# Patient Record
Sex: Female | Born: 1985 | Race: White | Hispanic: No | Marital: Single | Smoking: Never smoker
Health system: Southern US, Community
[De-identification: ages and names within clinical notes are randomized; demographics above are authoritative.]

## PROBLEM LIST (undated history)

## (undated) DIAGNOSIS — H539 Unspecified visual disturbance: Secondary | ICD-10-CM

## (undated) DIAGNOSIS — L309 Dermatitis, unspecified: Secondary | ICD-10-CM

## (undated) DIAGNOSIS — E039 Hypothyroidism, unspecified: Secondary | ICD-10-CM

## (undated) DIAGNOSIS — E119 Type 2 diabetes mellitus without complications: Secondary | ICD-10-CM

## (undated) DIAGNOSIS — K3184 Gastroparesis: Secondary | ICD-10-CM

## (undated) DIAGNOSIS — Z91018 Allergy to other foods: Secondary | ICD-10-CM

## (undated) DIAGNOSIS — E876 Hypokalemia: Secondary | ICD-10-CM

## (undated) DIAGNOSIS — E079 Disorder of thyroid, unspecified: Secondary | ICD-10-CM

## (undated) DIAGNOSIS — G629 Polyneuropathy, unspecified: Secondary | ICD-10-CM

## (undated) DIAGNOSIS — D496 Neoplasm of unspecified behavior of brain: Secondary | ICD-10-CM

## (undated) DIAGNOSIS — J45909 Unspecified asthma, uncomplicated: Secondary | ICD-10-CM

## (undated) HISTORY — DX: Unspecified asthma, uncomplicated: J45.909

## (undated) HISTORY — DX: Dermatitis, unspecified: L30.9

## (undated) HISTORY — DX: Allergy to other foods: Z91.018

## (undated) HISTORY — PX: BRAIN SURGERY: SHX531

## (undated) HISTORY — PX: WISDOM TOOTH EXTRACTION: SHX21

## (undated) HISTORY — DX: Unspecified visual disturbance: H53.9

## (undated) HISTORY — PX: OTHER SURGICAL HISTORY: SHX169

## (undated) HISTORY — DX: Gastroparesis: K31.84

---
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
## 2010-03-30 ENCOUNTER — Other Ambulatory Visit: Payer: Self-pay | Admitting: Gastroenterology

## 2010-03-30 DIAGNOSIS — R197 Diarrhea, unspecified: Secondary | ICD-10-CM

## 2010-04-09 ENCOUNTER — Ambulatory Visit
Admission: RE | Admit: 2010-04-09 | Discharge: 2010-04-09 | Disposition: A | Discharge status: Home / Self Care | Payer: BC Managed Care – PPO | Source: Ambulatory Visit | Attending: Gastroenterology | Admitting: Gastroenterology

## 2010-04-09 DIAGNOSIS — R197 Diarrhea, unspecified: Secondary | ICD-10-CM

## 2010-04-09 IMAGING — RF DG UGI W/ SMALL BOWEL
19 of 24 series · 19 of 24 positions shown · non-contrast
Comparison: None.

CLINICAL DATA: Diarrhea.

UPPER GI W/ SMALL BOWEL
TECHNIQUE: Upper GI series performed with high density barium and
effervescent agent. Thin barium also used.  Subsequently, serial
images of the small bowel were obtained including spot views of the
terminal ileum.
Fluoroscopy Time: 2.2 minutes, pediatric technique utilized.

[Series 1: run · 1 of 1 slices shown (1 of 19)]
[im 1/1]
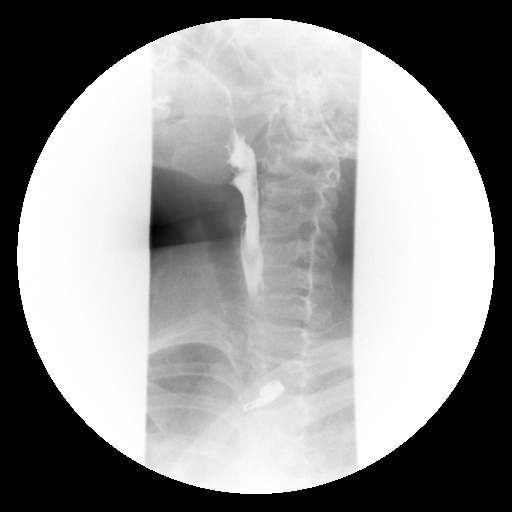

[Series 2: run · 1 of 1 slices shown (2 of 19)]
[im 1/1]
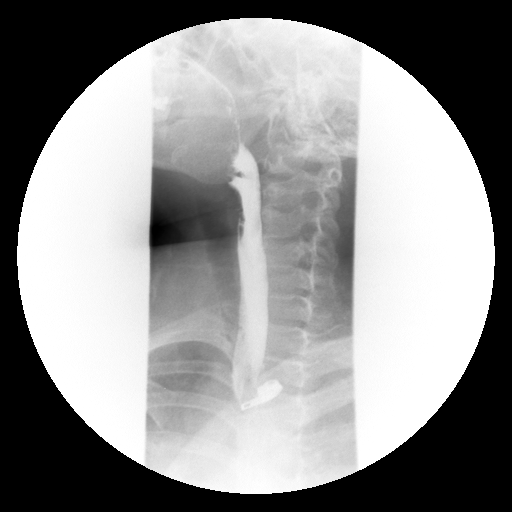

[Series 4: run · 1 of 1 slices shown (3 of 19)]
[im 1/1]
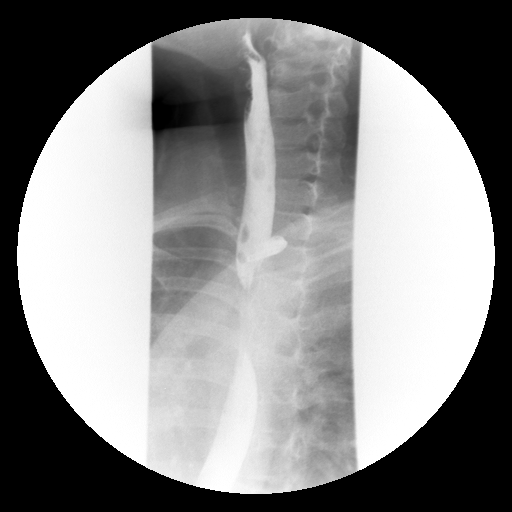

[Series 5: run · 1 of 1 slices shown (4 of 19)]
[im 1/1]
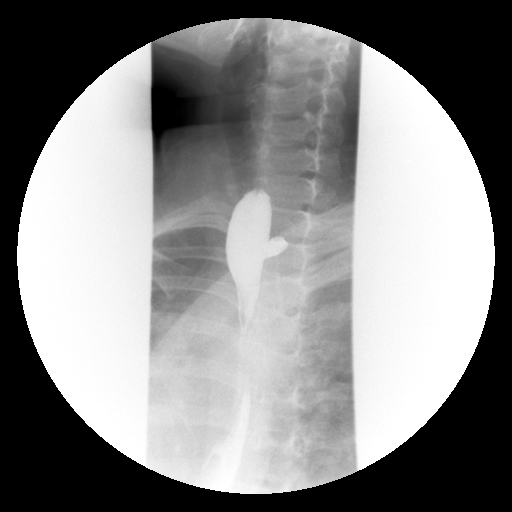

[Series 6: run · 1 of 1 slices shown (5 of 19)]
[im 1/1]
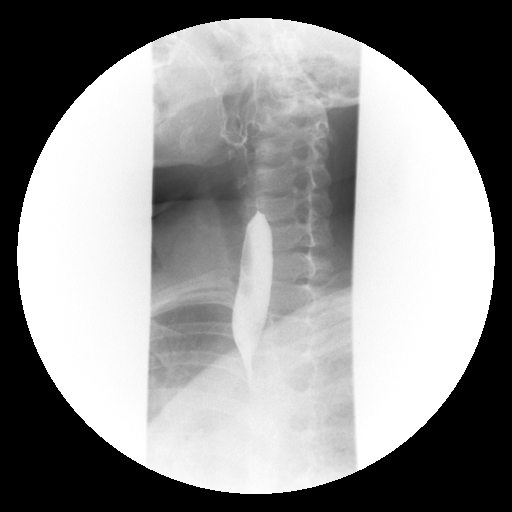

[Series 7: run · 1 of 1 slices shown (6 of 19)]
[im 1/1]
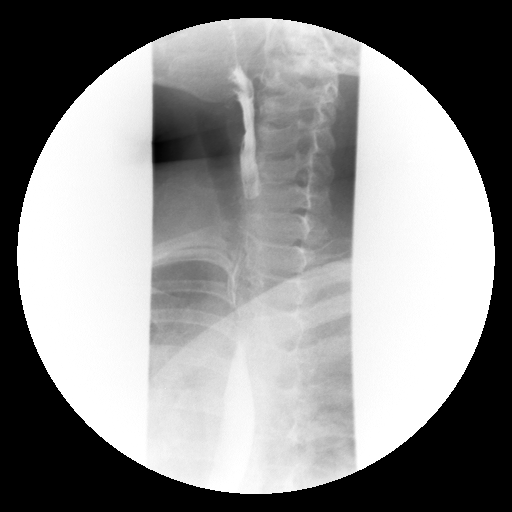

[Series 9: run · 1 of 1 slices shown (7 of 19)]
[im 1/1]
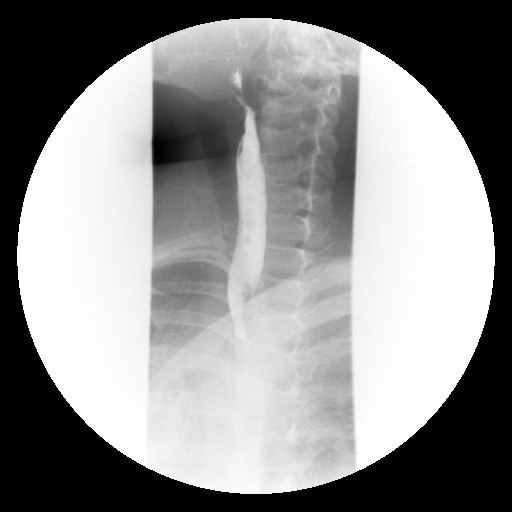

[Series 10: run · 1 of 1 slices shown (8 of 19)]
[im 1/1]
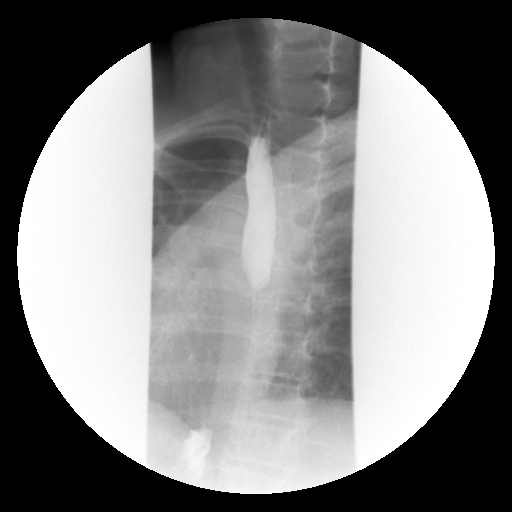

[Series 11: run · 1 of 1 slices shown (9 of 19)]
[im 1/1]
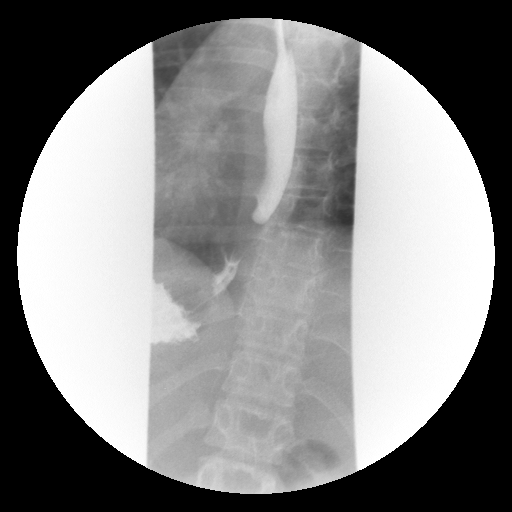

[Series 13: run · 1 of 1 slices shown (10 of 19)]
[im 1/1]
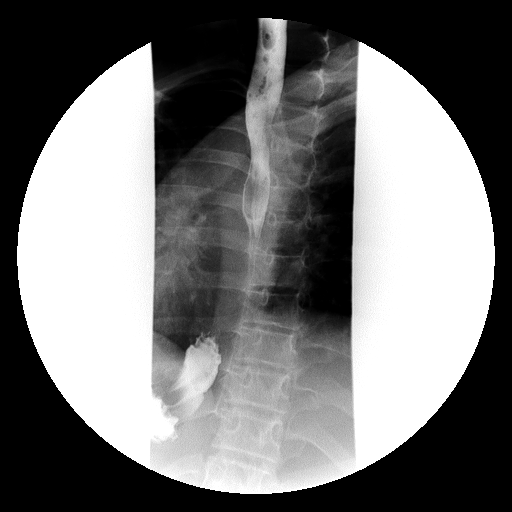

[Series 14: run · 1 of 1 slices shown (11 of 19)]
[im 1/1]
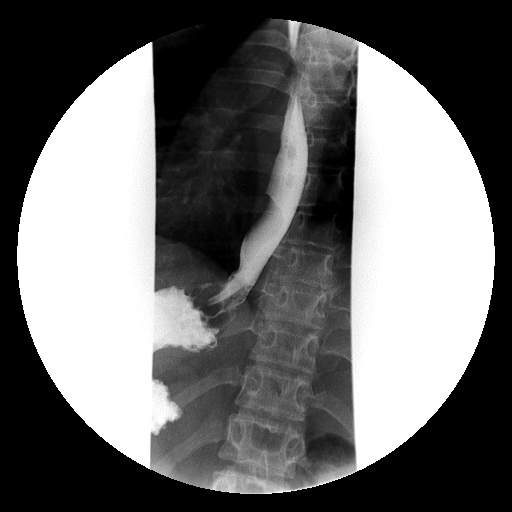

[Series 15: run · 1 of 1 slices shown (12 of 19)]
[im 1/1]
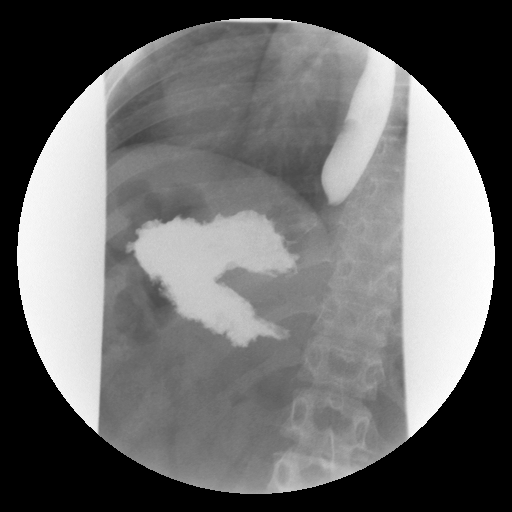

[Series 16: run · 1 of 1 slices shown (13 of 19)]
[im 1/1]
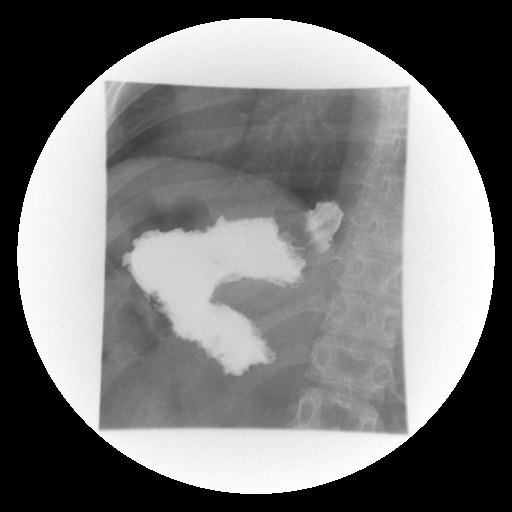

[Series 18: run · 1 of 1 slices shown (14 of 19)]
[im 1/1]
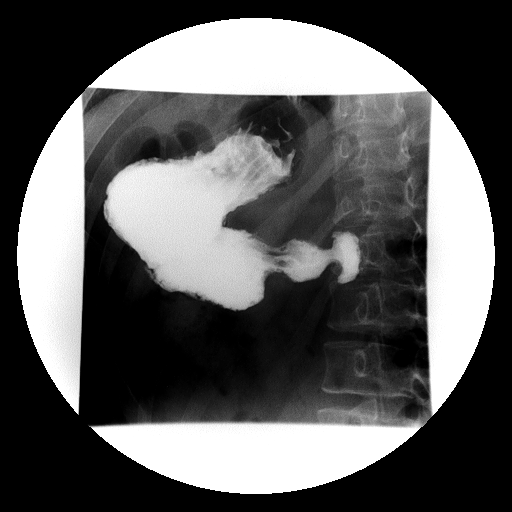

[Series 19: run · 1 of 1 slices shown (15 of 19)]
[im 1/1]
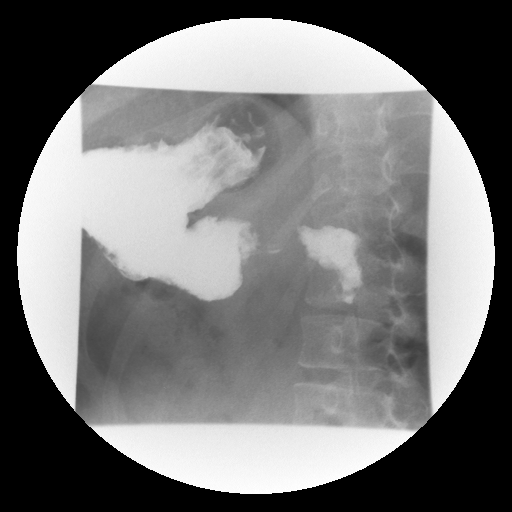

[Series 20: run · 1 of 1 slices shown (16 of 19)]
[im 1/1]
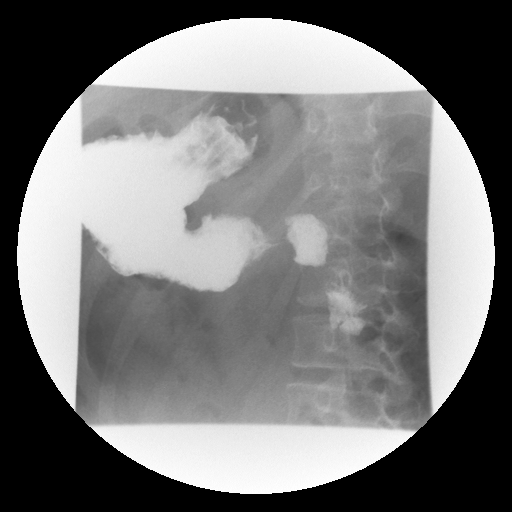

[Series 21: run · 1 of 1 slices shown (17 of 19)]
[im 1/1]
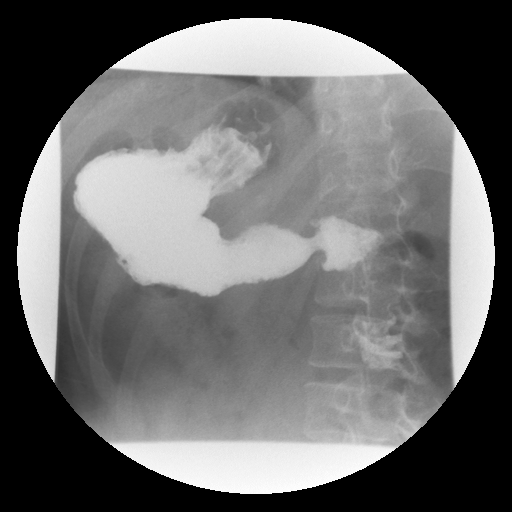

[Series 23: run · 1 of 1 slices shown (18 of 19)]
[im 1/1]
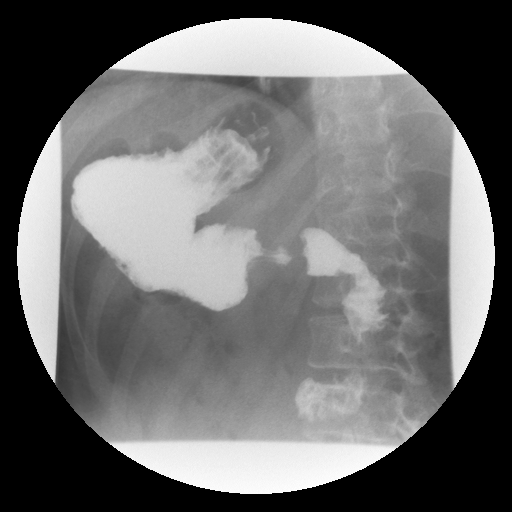

[Series 24: run · 1 of 1 slices shown (19 of 19)]
[im 1/1]
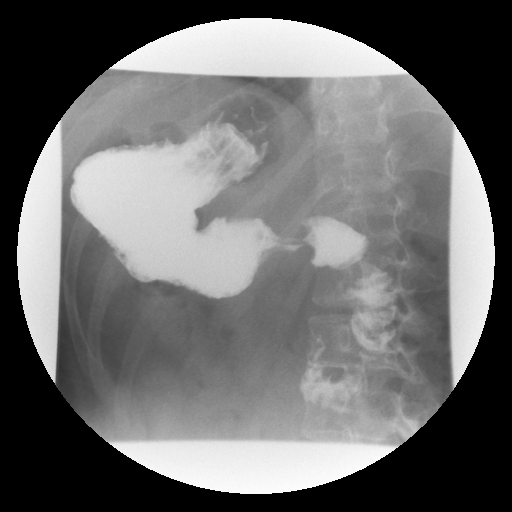

[19 of 24 positions shown; findings below may reference images not displayed]

FINDINGS: Scout view demonstrates moderate retained colonic feces
with normal bowel gas pattern.

Single contrast barium study demonstrates normal antegrade
peristalsis through cervical and thoracic esophagus with no
intrinsic or extrinsic esophageal lesion.  No spontaneous or
induced (Valsalva/water siphon) gastroesophageal reflux
demonstrated.

Stomach and duodenum appear normal with prompt egress of barium.

Small bowel loops appear normal with no focal lesions.
Specifically, spot films of terminal ileum appear normal without
inflammation.  However, barium transit time is slightly dilated 2
hours 30 minutes.
IMPRESSION: 1.  Slightly delayed small bowel barium transit time at 2 hours 30
minutes delay.
2.  Otherwise, negative. (Moderate retained colonic feces).

## 2013-03-17 ENCOUNTER — Encounter (HOSPITAL_COMMUNITY): Payer: Self-pay | Admitting: Emergency Medicine

## 2013-03-17 ENCOUNTER — Emergency Department (HOSPITAL_COMMUNITY)
Admission: EM | Admit: 2013-03-17 | Discharge: 2013-03-17 | Disposition: A | Discharge status: Home / Self Care | Payer: BC Managed Care – PPO | Attending: Emergency Medicine | Admitting: Emergency Medicine

## 2013-03-17 DIAGNOSIS — E109 Type 1 diabetes mellitus without complications: Secondary | ICD-10-CM | POA: Insufficient documentation

## 2013-03-17 DIAGNOSIS — Z794 Long term (current) use of insulin: Secondary | ICD-10-CM | POA: Insufficient documentation

## 2013-03-17 DIAGNOSIS — R739 Hyperglycemia, unspecified: Secondary | ICD-10-CM

## 2013-03-17 DIAGNOSIS — R112 Nausea with vomiting, unspecified: Secondary | ICD-10-CM | POA: Insufficient documentation

## 2013-03-17 DIAGNOSIS — R197 Diarrhea, unspecified: Secondary | ICD-10-CM

## 2013-03-17 DIAGNOSIS — R109 Unspecified abdominal pain: Secondary | ICD-10-CM | POA: Insufficient documentation

## 2013-03-17 DIAGNOSIS — E079 Disorder of thyroid, unspecified: Secondary | ICD-10-CM | POA: Insufficient documentation

## 2013-03-17 DIAGNOSIS — Z79899 Other long term (current) drug therapy: Secondary | ICD-10-CM | POA: Insufficient documentation

## 2013-03-17 DIAGNOSIS — E039 Hypothyroidism, unspecified: Secondary | ICD-10-CM | POA: Insufficient documentation

## 2013-03-17 DIAGNOSIS — E876 Hypokalemia: Secondary | ICD-10-CM | POA: Insufficient documentation

## 2013-03-17 DIAGNOSIS — G589 Mononeuropathy, unspecified: Secondary | ICD-10-CM | POA: Insufficient documentation

## 2013-03-17 HISTORY — DX: Hypomagnesemia: E83.42

## 2013-03-17 HISTORY — DX: Hypothyroidism, unspecified: E03.9

## 2013-03-17 HISTORY — DX: Hypokalemia: E87.6

## 2013-03-17 HISTORY — DX: Polyneuropathy, unspecified: G62.9

## 2013-03-17 HISTORY — DX: Type 2 diabetes mellitus without complications: E11.9

## 2013-03-17 HISTORY — DX: Neoplasm of unspecified behavior of brain: D49.6

## 2013-03-17 HISTORY — DX: Disorder of thyroid, unspecified: E07.9

## 2013-03-17 LAB — BASIC METABOLIC PANEL
BUN: 11 mg/dL (ref 6–23)
CHLORIDE: 96 meq/L (ref 96–112)
CO2: 24 meq/L (ref 19–32)
Calcium: 9.1 mg/dL (ref 8.4–10.5)
Creatinine, Ser: 0.76 mg/dL (ref 0.50–1.10)
GFR calc Af Amer: >90 mL/min (ref >90)
GFR calc non Af Amer: >90 mL/min (ref >90)
GLUCOSE: 291 mg/dL — AB (ref 70–99)
Potassium: 4.3 mEq/L (ref 3.7–5.3)
SODIUM: 136 meq/L — AB (ref 137–147)

## 2013-03-17 LAB — POC URINE PREG, ED: PREG TEST UR: NEGATIVE

## 2013-03-17 MED ORDER — ONDANSETRON HCL 4 MG/2ML IJ SOLN
4.0000 mg | Freq: Once | INTRAMUSCULAR | Status: AC
  Administered 2013-03-17: 4 mg via INTRAVENOUS
  Filled 2013-03-17: qty 2

## 2013-03-17 MED ORDER — SODIUM CHLORIDE 0.9 % IV BOLUS (SEPSIS)
1000.0000 mL | Freq: Once | INTRAVENOUS | Status: AC
  Administered 2013-03-17: 1000 mL via INTRAVENOUS

## 2013-03-17 MED ORDER — ONDANSETRON 4 MG PO TBDP: 4.0000 mg | ORAL_TABLET | ORAL | Status: DC | PRN

## 2013-03-17 NOTE — ED Notes (Signed)
Pt resting, given water for fluid challenge

## 2013-03-17 NOTE — ED Provider Notes (Signed)
CSN: [id number]     Arrival date & time 03/17/13  0808 History   First MD Initiated Contact with Patient 03/17/13 0809     No chief complaint on file.    (Consider location/radiation/quality/duration/timing/severity/associated sxs/prior Treatment) HPI Comments: 28 year old female presents with diarrhea over the last 8 hours or so. The patient and her mom does state that the diarrhea has been watery multiple times. Patient had similar episode 4 days ago which lasted all day. Both of these times occurred after having tuna fish to eat. The patient did vomit once. She's had some vague abdominal pain during this diarrhea. No blood in her stools. No fevers or recent travel or antibiotics. The patient is a type I diabetic his regular insulin. Her most recent glucose is over 200. She's had to adjust her insulin pump due to the concern about getting hypoglycemic.   No past medical history on file. No past surgical history on file. No family history on file. History  Substance Use Topics  . Smoking status: Not on file  . Smokeless tobacco: Not on file  . Alcohol Use: Not on file   OB History   No data available     Review of Systems  Constitutional: Negative for fever and chills.  Gastrointestinal: Positive for nausea, vomiting (once), abdominal pain and diarrhea. Negative for blood in stool.  Genitourinary: Negative for dysuria.  Neurological: Negative for light-headedness.  All other systems reviewed and are negative.      Allergies  Review of patient's allergies indicates not on file.  Home Medications  No current outpatient prescriptions on file. BP 91/61  Pulse 75  Temp(Src) 98 F (36.7 C) (Oral)  Resp 18  Ht 5' 2.5" (1.588 m)  SpO2 98%  LMP 03/13/2013 Physical Exam  Nursing note and vitals reviewed. Constitutional: She is oriented to person, place, and time. She appears well-developed and well-nourished. No distress.  HENT:  Head: Normocephalic and atraumatic.  Right  Ear: External ear normal.  Left Ear: External ear normal.  Nose: Nose normal.  Eyes: Right eye exhibits no discharge. Left eye exhibits no discharge.  Cardiovascular: Normal rate, regular rhythm and normal heart sounds.   Pulse right around 100  Pulmonary/Chest: Effort normal and breath sounds normal.  Abdominal: Soft. She exhibits no distension. There is no tenderness.  Neurological: She is alert and oriented to person, place, and time.  Skin: Skin is warm and dry.    ED Course  Procedures (including critical care time) Labs Review Labs Reviewed  BASIC METABOLIC PANEL - Abnormal; Notable for the following:    Sodium 136 (*)    Glucose, Bld 291 (*)    All other components within normal limits  POC URINE PREG, ED   Imaging Review No results found.   EKG Interpretation None      MDM   Final diagnoses:  Diarrhea  Hyperglycemia    Patient's HR improved with fluids. No vomiting in ED, was able to take PO. Benign abd exam. Her BP at discharge was in the 90s, but family states she's always "low" but don't know exact numbers. She is able to take po, has normal HR and feels normal. I feel she is stable for discharge, given strict return precautions.     Ephraim Hamburger, MD 03/17/13 [phone]

## 2013-03-17 NOTE — ED Notes (Signed)
Pt check own cbg and now down to 130, given sprite per family request

## 2013-03-17 NOTE — ED Notes (Signed)
Pt checked own cbg and it was 180.

## 2013-03-17 NOTE — ED Notes (Signed)
bp is 91/61, dr Regenia Skeeter is aware and family reports that pt normally has a low bp. Pt reports feeling better, is a&ox4. No distress noted.

## 2013-03-17 NOTE — ED Notes (Signed)
Pt tolerated po challenge, no complaints, no distress noted

## 2013-03-17 NOTE — ED Notes (Signed)
Pt and family member reports pt having diarrhea x 4 days. Had n/v this am.

## 2013-07-14 ENCOUNTER — Emergency Department (HOSPITAL_COMMUNITY): Payer: BC Managed Care – PPO

## 2013-07-14 ENCOUNTER — Emergency Department (HOSPITAL_COMMUNITY)
Admission: EM | Admit: 2013-07-14 | Discharge: 2013-07-14 | Disposition: A | Discharge status: Home / Self Care | Payer: BC Managed Care – PPO | Attending: Emergency Medicine | Admitting: Emergency Medicine

## 2013-07-14 ENCOUNTER — Encounter (HOSPITAL_COMMUNITY): Payer: Self-pay | Admitting: Emergency Medicine

## 2013-07-14 DIAGNOSIS — R109 Unspecified abdominal pain: Secondary | ICD-10-CM | POA: Diagnosis not present

## 2013-07-14 DIAGNOSIS — E119 Type 2 diabetes mellitus without complications: Secondary | ICD-10-CM | POA: Diagnosis not present

## 2013-07-14 DIAGNOSIS — Z3202 Encounter for pregnancy test, result negative: Secondary | ICD-10-CM | POA: Insufficient documentation

## 2013-07-14 DIAGNOSIS — R5381 Other malaise: Secondary | ICD-10-CM | POA: Diagnosis not present

## 2013-07-14 DIAGNOSIS — Z794 Long term (current) use of insulin: Secondary | ICD-10-CM | POA: Diagnosis not present

## 2013-07-14 DIAGNOSIS — E039 Hypothyroidism, unspecified: Secondary | ICD-10-CM | POA: Insufficient documentation

## 2013-07-14 DIAGNOSIS — R5383 Other fatigue: Secondary | ICD-10-CM

## 2013-07-14 DIAGNOSIS — Z86011 Personal history of benign neoplasm of the brain: Secondary | ICD-10-CM | POA: Insufficient documentation

## 2013-07-14 DIAGNOSIS — Z79899 Other long term (current) drug therapy: Secondary | ICD-10-CM | POA: Diagnosis not present

## 2013-07-14 DIAGNOSIS — R197 Diarrhea, unspecified: Secondary | ICD-10-CM | POA: Diagnosis present

## 2013-07-14 DIAGNOSIS — E876 Hypokalemia: Secondary | ICD-10-CM | POA: Insufficient documentation

## 2013-07-14 DIAGNOSIS — R110 Nausea: Secondary | ICD-10-CM | POA: Diagnosis not present

## 2013-07-14 DIAGNOSIS — R739 Hyperglycemia, unspecified: Secondary | ICD-10-CM

## 2013-07-14 LAB — URINALYSIS, ROUTINE W REFLEX MICROSCOPIC
BILIRUBIN URINE: NEGATIVE
Glucose, UA: NEGATIVE mg/dL
Ketones, ur: NEGATIVE mg/dL
Leukocytes, UA: NEGATIVE
NITRITE: NEGATIVE
PROTEIN: NEGATIVE mg/dL
Specific Gravity, Urine: 1.02 (ref 1.005–1.030)
UROBILINOGEN UA: 0.2 mg/dL (ref 0.0–1.0)
pH: 6 (ref 5.0–8.0)

## 2013-07-14 LAB — COMPREHENSIVE METABOLIC PANEL
ALT: 13 U/L (ref 0–35)
AST: 16 U/L (ref 0–37)
Albumin: 3.7 g/dL (ref 3.5–5.2)
Alkaline Phosphatase: 82 U/L (ref 39–117)
BILIRUBIN TOTAL: 0.4 mg/dL (ref 0.3–1.2)
BUN: 14 mg/dL (ref 6–23)
CHLORIDE: 97 meq/L (ref 96–112)
CO2: 23 meq/L (ref 19–32)
CREATININE: 0.74 mg/dL (ref 0.50–1.10)
Calcium: 8.7 mg/dL (ref 8.4–10.5)
GLUCOSE: 214 mg/dL — AB (ref 70–99)
Potassium: 4 mEq/L (ref 3.7–5.3)
Sodium: 136 mEq/L — ABNORMAL LOW (ref 137–147)
Total Protein: 7.6 g/dL (ref 6.0–8.3)

## 2013-07-14 LAB — CBC WITH DIFFERENTIAL/PLATELET
BASOS ABS: 0 10*3/uL (ref 0.0–0.1)
Basophils Relative: 0 % (ref 0–1)
EOS PCT: 1 % (ref 0–5)
Eosinophils Absolute: 0.1 10*3/uL (ref 0.0–0.7)
HEMATOCRIT: 36.1 % (ref 36.0–46.0)
HEMOGLOBIN: 12.2 g/dL (ref 12.0–15.0)
LYMPHS ABS: 1.5 10*3/uL (ref 0.7–4.0)
LYMPHS PCT: 13 % (ref 12–46)
MCH: 31.1 pg (ref 26.0–34.0)
MCHC: 33.8 g/dL (ref 30.0–36.0)
MCV: 92.1 fL (ref 78.0–100.0)
MONO ABS: 1.1 10*3/uL — AB (ref 0.1–1.0)
MONOS PCT: 9 % (ref 3–12)
NEUTROS ABS: 9 10*3/uL — AB (ref 1.7–7.7)
Neutrophils Relative %: 77 % (ref 43–77)
Platelets: 375 10*3/uL (ref 150–400)
RBC: 3.92 MIL/uL (ref 3.87–5.11)
RDW: 13.2 % (ref 11.5–15.5)
WBC: 11.6 10*3/uL — AB (ref 4.0–10.5)

## 2013-07-14 LAB — I-STAT VENOUS BLOOD GAS, ED
Acid-base deficit: 3 mmol/L — ABNORMAL HIGH (ref 0.0–2.0)
Bicarbonate: 22.1 mEq/L (ref 20.0–24.0)
O2 SAT: 94 %
PCO2 VEN: 39.5 mmHg — AB (ref 45.0–50.0)
PO2 VEN: 74 mmHg — AB (ref 30.0–45.0)
TCO2: 23 mmol/L (ref 0–100)
pH, Ven: 7.356 — ABNORMAL HIGH (ref 7.250–7.300)

## 2013-07-14 LAB — LIPASE, BLOOD: Lipase: 15 U/L (ref 11–59)

## 2013-07-14 LAB — URINE MICROSCOPIC-ADD ON

## 2013-07-14 LAB — KETONES, QUALITATIVE: Acetone, Bld: NEGATIVE

## 2013-07-14 LAB — CBG MONITORING, ED: Glucose-Capillary: 233 mg/dL — ABNORMAL HIGH (ref 70–99)

## 2013-07-14 LAB — PREGNANCY, URINE: PREG TEST UR: NEGATIVE

## 2013-07-14 LAB — CLOSTRIDIUM DIFFICILE BY PCR: Toxigenic C. Difficile by PCR: NEGATIVE

## 2013-07-14 IMAGING — CR DG ABDOMEN ACUTE W/ 1V CHEST
3 series · 3 of 3 positions shown · non-contrast
Comparison: [DATE]

CLINICAL DATA: DIABETES DIARRHEA ABDOMINAL PAIN

EXAM:
ACUTE ABDOMEN SERIES (ABDOMEN 2 VIEW & CHEST 1 VIEW)

[w chest pa]
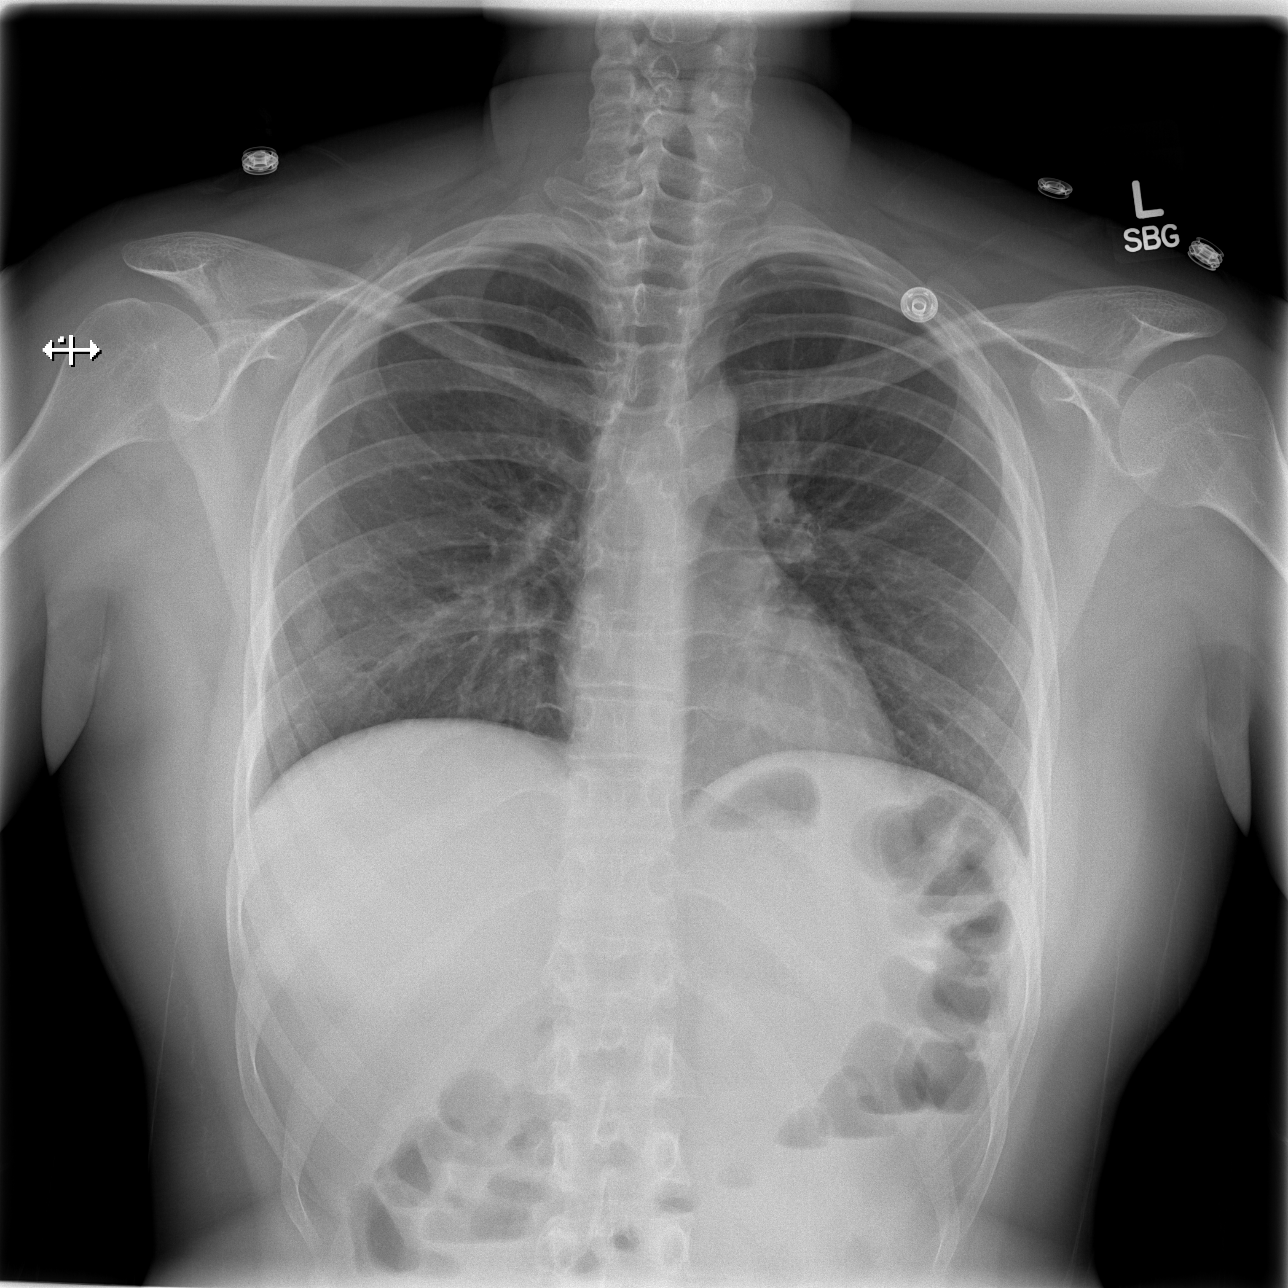

[w abdomen upright]
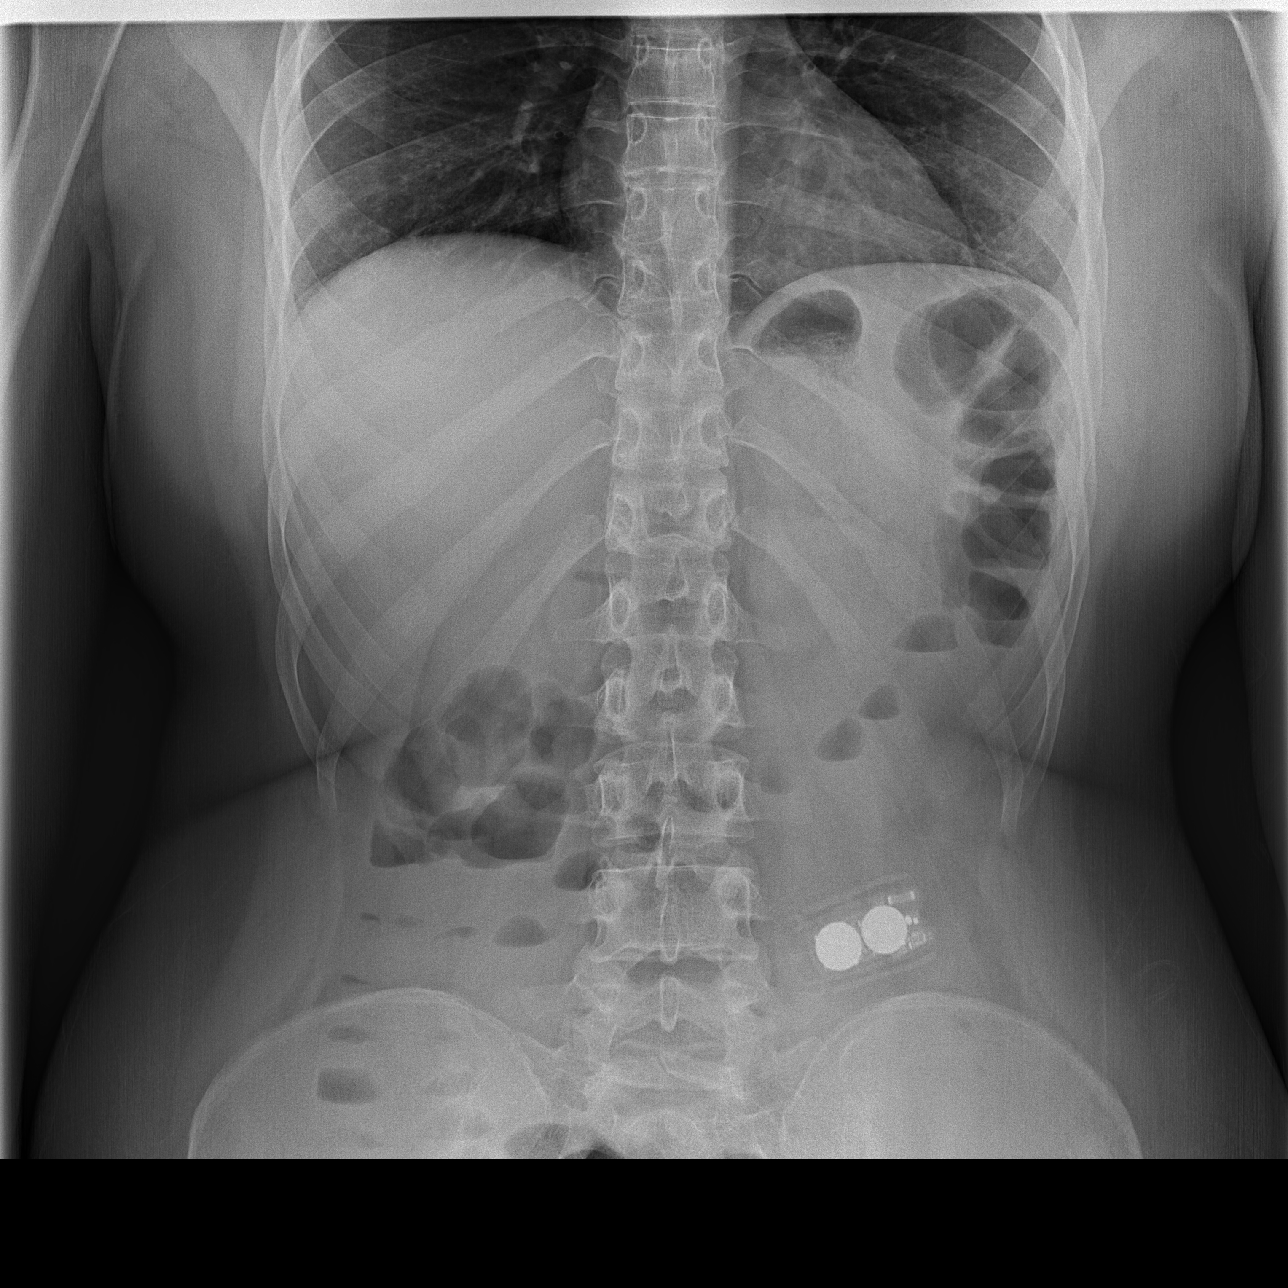

[t abdomen supine]
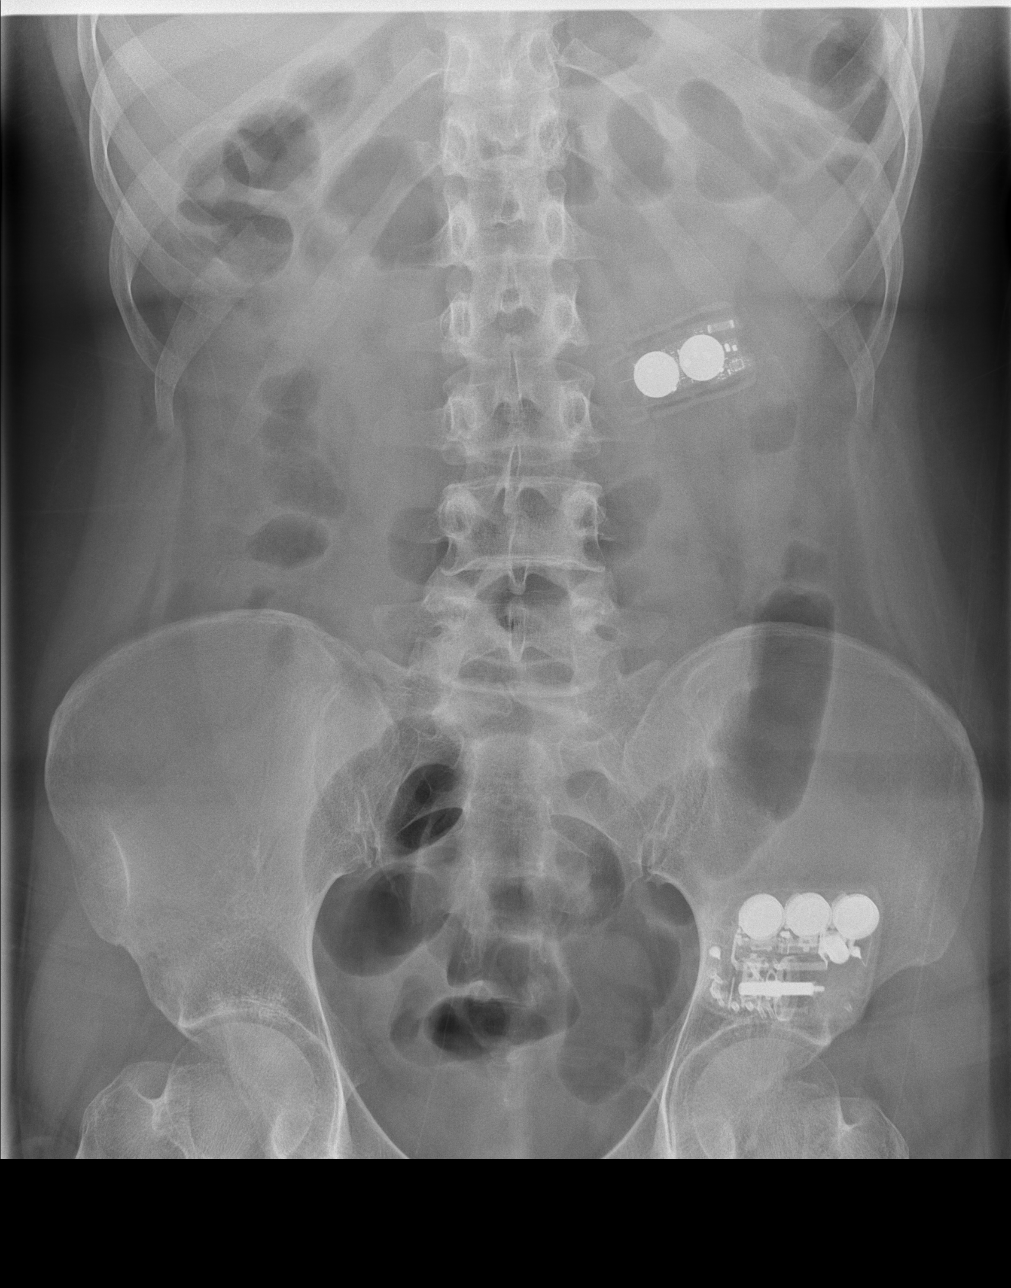

[3 of 3 positions shown; findings below may reference images not displayed]

FINDINGS: Heart size and mediastinal contours are within normal limits.

Lungs are clear. No effusion.

No free air. Small bowel and colon decompressed. A few fluid levels
in the proximal colon indicating liquid content.

There are no abnormal calcifications. Electronic devices project
over the mid left abdomen and left supra-acetabular region.

Regional bones unremarkable.
IMPRESSION: No acute cardiopulmonary disease.

Nonobstructive bowel gas pattern.

## 2013-07-14 IMAGING — CT CT ABD-PELV W/O CM
2 of 4 series · 4 of 46 positions shown, 5 images · non-contrast
Comparison: Earlier radiographs of the same day

CLINICAL DATA: DIABETES DIARRHEA ABDOMINAL PAIN

EXAM:
CT ABDOMEN AND PELVIS WITHOUT CONTRAST
TECHNIQUE: Multidetector CT imaging of the abdomen and pelvis was performed
following the standard protocol without IV contrast.

[Series 206: cor · coronal · 0.50mm/px · 3 of 79 slices shown, 4 images]
[im 18/79  soft-tissue]
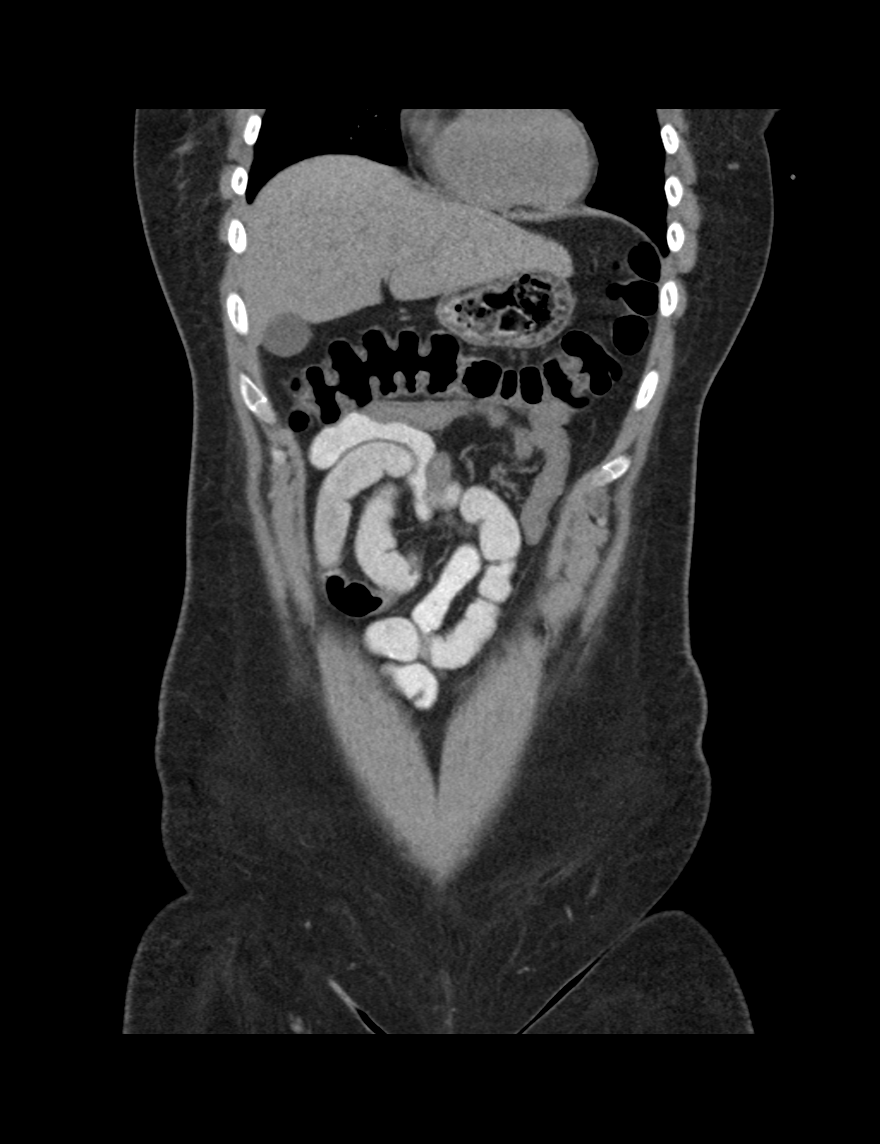
[im 18/79  bone]
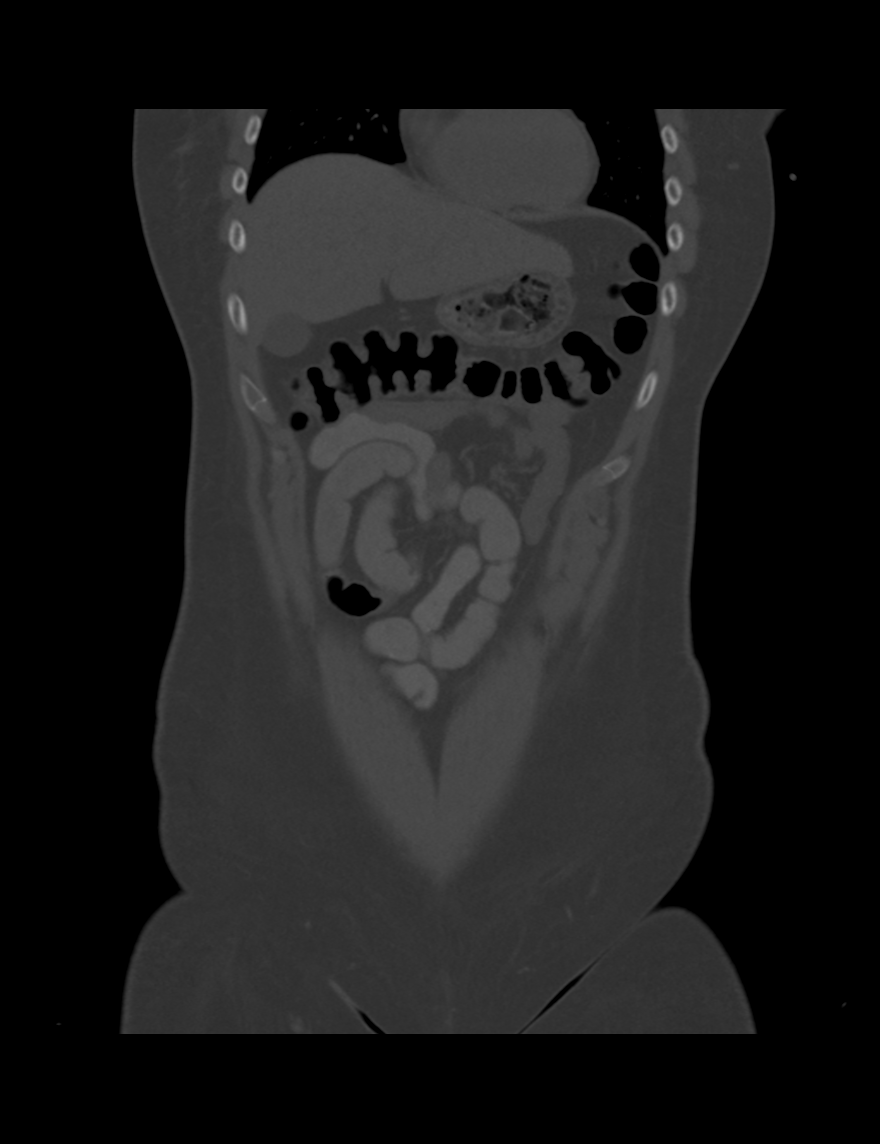
[im 44/79  soft-tissue]
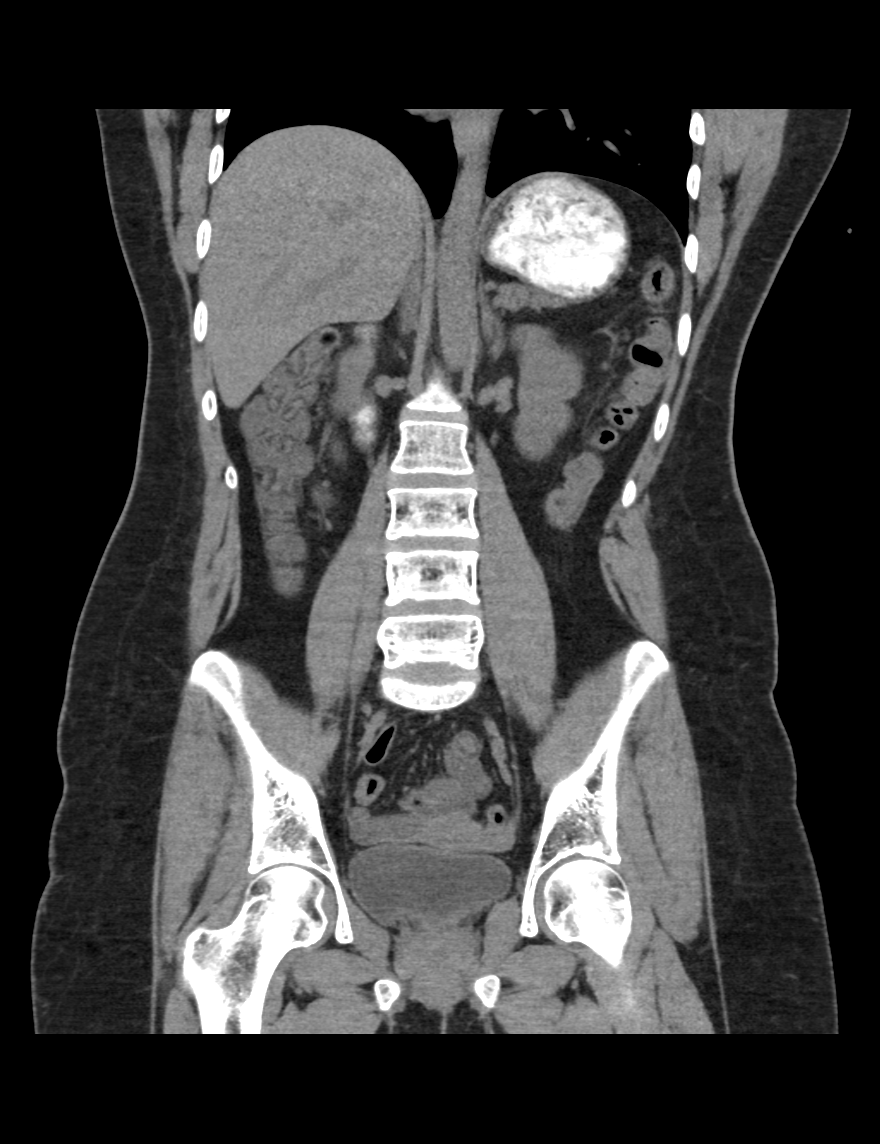
[im 61/79  soft-tissue]
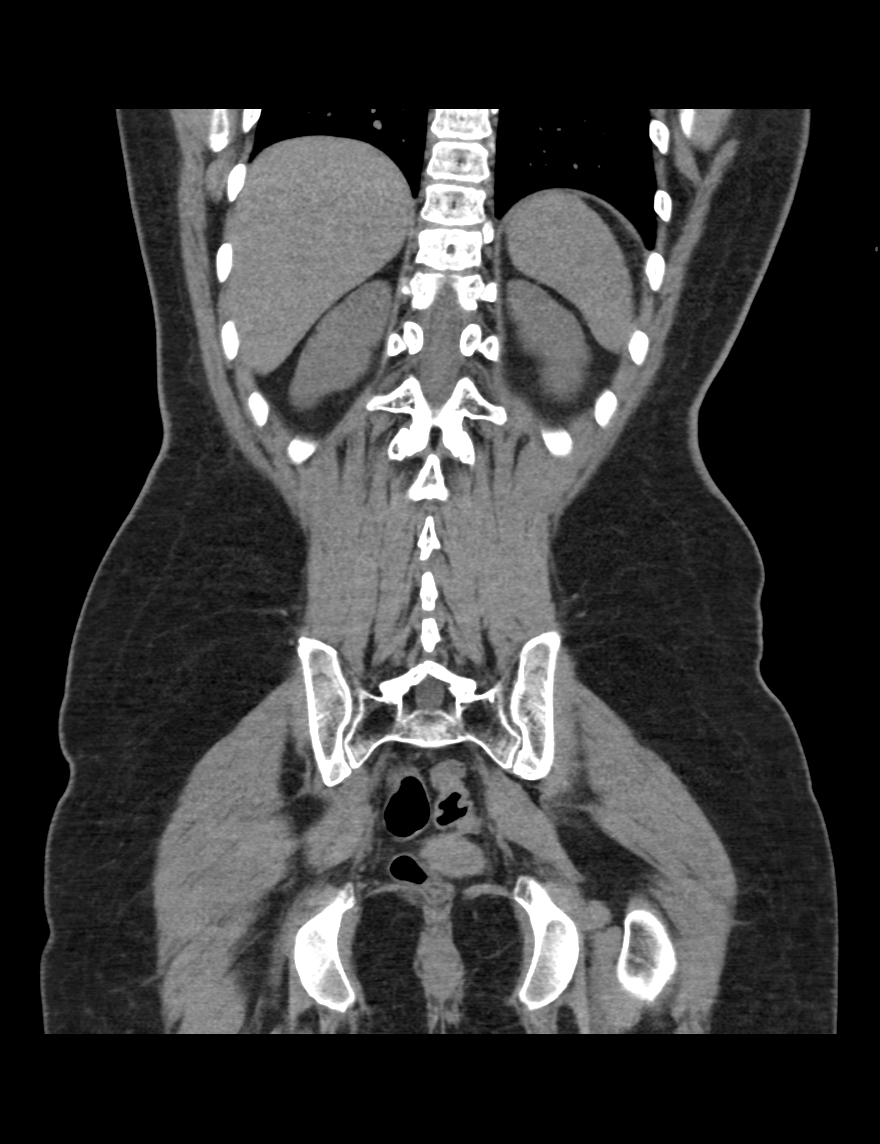

[Series 207: sag · sagittal · 0.50mm/px · 1 of 116 slices shown]
[im 39/116  soft-tissue]
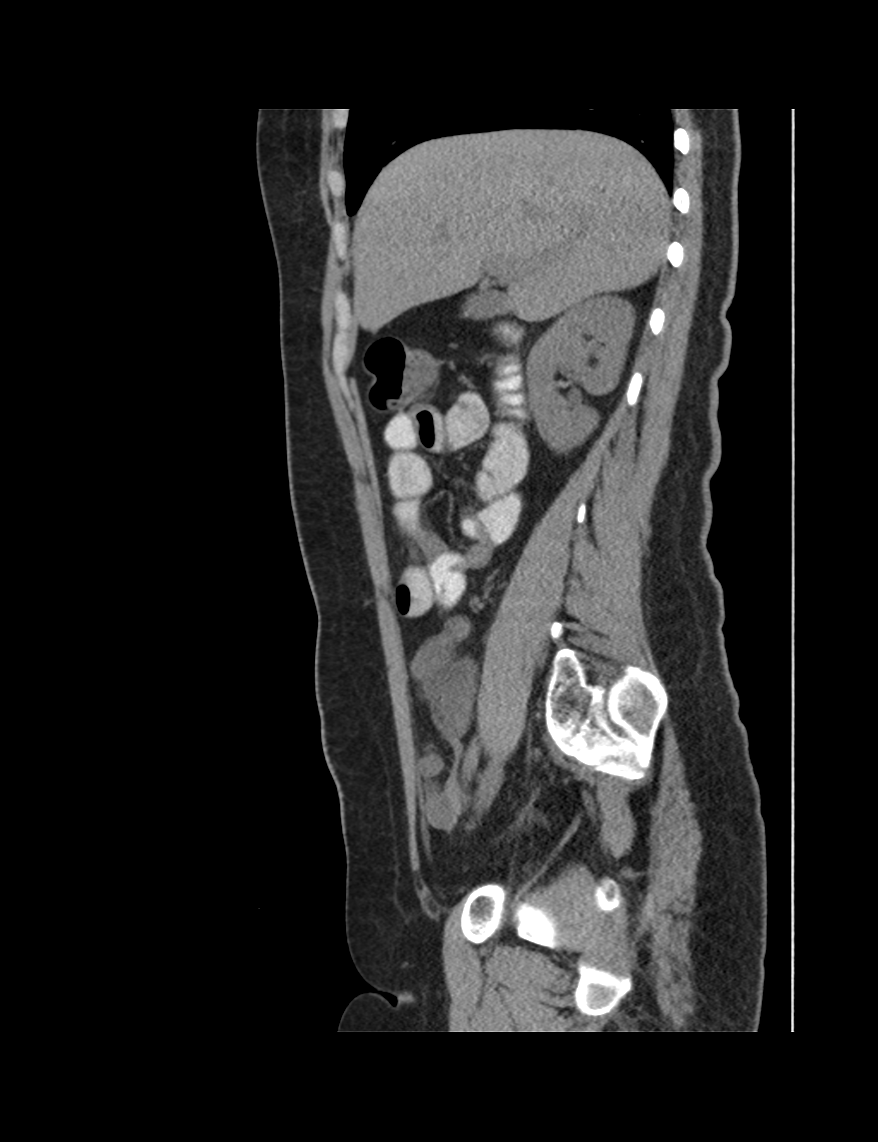

[4 of 46 positions shown; findings below may reference images not displayed]

FINDINGS: Visualized lung bases clear. Unremarkable liver, gallbladder,
spleen, adrenal glands, kidneys, pancreas, aorta. Unenhanced CT was
performed per clinician order. Lack of IV contrast limits
sensitivity and specificity, especially for evaluation of
abdominal/pelvic solid viscera.

Stomach, small bowel, colon are nondilated. Appendix not discretely
identified. Uterus and adnexal regions unremarkable. Urinary bladder
physiologically distended. No ascites. No free air. No adenopathy.
IMPRESSION: 1. No acute abdominal process.

## 2013-07-14 MED ORDER — ONDANSETRON HCL 4 MG/2ML IJ SOLN
4.0000 mg | Freq: Once | INTRAMUSCULAR | Status: AC
  Administered 2013-07-14: 4 mg via INTRAVENOUS
  Filled 2013-07-14: qty 2

## 2013-07-14 MED ORDER — ONDANSETRON HCL 4 MG PO TABS: 4.0000 mg | ORAL_TABLET | Freq: Four times a day (QID) | ORAL | Status: DC

## 2013-07-14 MED ORDER — SODIUM CHLORIDE 0.9 % IV BOLUS (SEPSIS)
1000.0000 mL | Freq: Once | INTRAVENOUS | Status: AC
Start: 2013-07-14 — End: 2013-07-14
  Administered 2013-07-14: 1000 mL via INTRAVENOUS

## 2013-07-14 MED ORDER — SODIUM CHLORIDE 0.9 % IV BOLUS (SEPSIS)
1000.0000 mL | Freq: Once | INTRAVENOUS | Status: AC
  Administered 2013-07-14: 1000 mL via INTRAVENOUS

## 2013-07-14 NOTE — ED Notes (Signed)
Pt was able to tolerate drinking 8 oz of H2o without n/v after 30 min.

## 2013-07-14 NOTE — ED Notes (Signed)
PT is diabetic and has insulin pump.  Pt started having lower abdominal pain yesterday and then at 0300 started having diarrhea and nausea.  Pt states that she is now spilling ketones in her urine.

## 2013-07-14 NOTE — ED Notes (Signed)
Pt drinking sprite d/t bs dropping to 72 per family.  Tolerating po well - denies increased abdominal pain with po.

## 2013-07-14 NOTE — ED Notes (Signed)
Pt stated her personal CBG is 183 and requests not to have cbg by hospital staff.  Dr Wyvonnia Dusky notified.

## 2013-07-14 NOTE — ED Provider Notes (Signed)
CSN: [id number]     Arrival date & time 07/14/13  8527 History   First MD Initiated Contact with Patient 07/14/13 [phone]     Chief Complaint  Patient presents with  . Diabetes  . Diarrhea  . Abdominal Pain     (Consider location/radiation/quality/duration/timing/severity/associated sxs/prior Treatment) HPI Comments: Type I diabetic with insulin pump in place presenting with diffuse abdominal pain since yesterday that is cramping in coming going. Associated with diarrhea and nausea started around 3 AM. She reports about ten episodes of nonbloody stoo with nausea but no vomiting. She denies any fever. She denies any recent travel or antibiotic use. No sick contacts. 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 Paulita Fujita of GI last night requesting antibiotics before the diarrhea started. Last time she had this pain was in February should not require hospitalization. She states she's been spilling ketones in her urine this morning. She denies any urinary or vaginal symptoms. She denies any chest pain or shortness of breath. No dizziness or lightheadedness.  The history is provided by the patient and a relative.    Past Medical History  Diagnosis Date  . Diabetes mellitus without complication   . Thyroid disease   . Hypothyroid   . Brain tumor   . Neuropathy   . Hypokalemia   . Hypomagnesemia    Past Surgical History  Procedure Laterality Date  . Brain surgery    . Portacath placement and removed     No family history on file. History  Substance Use Topics  . Smoking status: Never Smoker   . Smokeless tobacco: Not on file  . Alcohol Use: No   OB History   Grav Para Term Preterm Abortions TAB SAB Ect Mult Living                 Review of Systems  Constitutional: Positive for activity change, appetite change and fatigue. Negative for fever.  Respiratory: Negative for cough, chest tightness and shortness of breath.    Cardiovascular: Negative for chest pain.  Gastrointestinal: Positive for nausea, abdominal pain and diarrhea. Negative for vomiting.  Genitourinary: Negative for dysuria, hematuria, vaginal bleeding and vaginal discharge.  Musculoskeletal: Negative for arthralgias, back pain and myalgias.  Skin: Negative for rash.  Neurological: Positive for weakness. Negative for dizziness and headaches.  A complete 10 system review of systems was obtained and all systems are negative except as noted in the HPI and PMH.      Allergies  Gentian violet-proflavine sulfate; Morphine and related; and Vancomycin  Home Medications   Prior to Admission medications   Medication Sig Start Date End Date Taking? Authorizing Provider  Insulin Human (INSULIN PUMP) SOLN Inject into the skin. novolog   Yes Historical Provider, MD  levothyroxine (SYNTHROID, LEVOTHROID) 88 MCG tablet Take 88 mcg by mouth daily before breakfast.   Yes Historical Provider, MD  lubiprostone (AMITIZA) 8 MCG capsule Take 8 mcg by mouth 2 (two) times daily with a meal.   Yes Historical Provider, MD  magnesium oxide (MAG-OX) 400 MG tablet Take 800 mg by mouth at bedtime.   Yes Historical Provider, MD  metoCLOPramide (REGLAN) 5 MG tablet Take 5 mg by mouth 4 (four) times daily as needed for nausea.   Yes Historical Provider, MD  Multiple Vitamin (MULTIVITAMIN) capsule Take 1 capsule by mouth daily.   Yes Historical Provider, MD  norgestimate-ethinyl estradiol ([place] 28) 0.25-35 MG-MCG tablet Take 1 tablet by mouth  daily.   Yes Historical Provider, MD  ondansetron (ZOFRAN ODT) 4 MG disintegrating tablet Take 1 tablet (4 mg total) by mouth every 4 (four) hours as needed for nausea or vomiting. 03/17/13  Yes Ephraim Hamburger, MD  potassium chloride (K-DUR,KLOR-CON) 10 MEQ tablet Take 10 mEq by mouth daily.   Yes Historical Provider, MD  ondansetron (ZOFRAN) 4 MG tablet Take 1 tablet (4 mg total) by mouth every 6 (six) hours. 07/14/13   Ezequiel Essex, MD   BP 94/59  Pulse 102  Temp(Src) 97.8 F (36.6 C) (Oral)  Resp 16  SpO2 100%  LMP 07/07/2013 Physical Exam  Nursing note and vitals reviewed. Constitutional: She is oriented to person, place, and time. She appears well-developed and well-nourished. No distress.  HENT:  Head: Normocephalic and atraumatic.  Mouth/Throat: Oropharynx is clear and moist. No oropharyngeal exudate.  Dry mucous membranes  Eyes: Conjunctivae and EOM are normal. Pupils are equal, round, and reactive to light.  Neck: Normal range of motion. Neck supple.  No meningismus.  Cardiovascular: Normal rate, normal heart sounds and intact distal pulses.   No murmur heard. Mild tachycardia  Pulmonary/Chest: Effort normal and breath sounds normal. No respiratory distress.  Abdominal: Soft. There is tenderness. There is no rebound and no guarding.  Mild diffuse tenderness, no guarding or rebound Insulin pump in place  Musculoskeletal: Normal range of motion. She exhibits no edema and no tenderness.  Neurological: She is alert and oriented to person, place, and time. No cranial nerve deficit. She exhibits normal muscle tone. Coordination normal.  No ataxia on finger to nose bilaterally. No pronator drift. 5/5 strength throughout. CN 2-12 intact. Negative Romberg. Equal grip strength. Sensation intact. Gait is normal.   Skin: Skin is warm.  Psychiatric: She has a normal mood and affect. Her behavior is normal.    ED Course  Procedures (including critical care time) Labs Review Labs Reviewed  CBC WITH DIFFERENTIAL - Abnormal; Notable for the following:    WBC 11.6 (*)    Neutro Abs 9.0 (*)    Monocytes Absolute 1.1 (*)    All other components within normal limits  COMPREHENSIVE METABOLIC PANEL - Abnormal; Notable for the following:    Sodium 136 (*)    Glucose, Bld 214 (*)    All other components within normal limits  URINALYSIS, ROUTINE W REFLEX MICROSCOPIC - Abnormal; Notable for the following:     Hgb urine dipstick MODERATE (*)    All other components within normal limits  URINE MICROSCOPIC-ADD ON - Abnormal; Notable for the following:    Squamous Epithelial / LPF FEW (*)    All other components within normal limits  CBG MONITORING, ED - Abnormal; Notable for the following:    Glucose-Capillary 233 (*)    All other components within normal limits  I-STAT VENOUS BLOOD GAS, ED - Abnormal; Notable for the following:    pH, Ven 7.356 (*)    pCO2, Ven 39.5 (*)    pO2, Ven 74.0 (*)    Acid-base deficit 3.0 (*)    All other components within normal limits  CLOSTRIDIUM DIFFICILE BY PCR  PREGNANCY, URINE  KETONES, QUALITATIVE  LIPASE, BLOOD  GI PATHOGEN PANEL BY PCR, STOOL  CBG MONITORING, ED    Imaging Review Ct Abdomen Pelvis Wo Contrast  07/14/2013   CLINICAL DATA:  DIABETES DIARRHEA ABDOMINAL PAIN  EXAM: CT ABDOMEN AND PELVIS WITHOUT CONTRAST  TECHNIQUE: Multidetector CT imaging of the abdomen and pelvis was performed following the  standard protocol without IV contrast.  COMPARISON:  Earlier radiographs of the same day  FINDINGS: Visualized lung bases clear. Unremarkable liver, gallbladder, spleen, adrenal glands, kidneys, pancreas, aorta. Unenhanced CT was performed per clinician order. Lack of IV contrast limits sensitivity and specificity, especially for evaluation of abdominal/pelvic solid viscera.  Stomach, small bowel, colon are nondilated. Appendix not discretely identified. Uterus and adnexal regions unremarkable. Urinary bladder physiologically distended. No ascites. No free air. No adenopathy.  IMPRESSION: 1. No acute abdominal process.   Electronically Signed   By: Arne Cleveland M.D.   On: 07/14/2013 11:37   Dg Abd Acute W/chest  07/14/2013   CLINICAL DATA:  DIABETES DIARRHEA ABDOMINAL PAIN  EXAM: ACUTE ABDOMEN SERIES (ABDOMEN 2 VIEW & CHEST 1 VIEW)  COMPARISON:  09/20/2004  FINDINGS: Heart size and mediastinal contours are within normal limits.  Lungs are clear. No  effusion.  No free air. Small bowel and colon decompressed. A few fluid levels in the proximal colon indicating liquid content.  There are no abnormal calcifications. Electronic devices project over the mid left abdomen and left supra-acetabular region.  Regional bones unremarkable.  IMPRESSION: No acute cardiopulmonary disease.  Nonobstructive bowel gas pattern.   Electronically Signed   By: Arne Cleveland M.D.   On: 07/14/2013 08:46     EKG Interpretation None      MDM   Final diagnoses:  Diarrhea  Abdominal pain, unspecified abdominal location  Hyperglycemia   Diarrhea, abdominal pain and nausea since yesterday. Type I diabetic with insulin pump. No fever. CBG 233. Similar episode due to "bacterial overgrowth".  UA shows hematuria. No ketones. Patient is on menstrual cycle. Hyperglycemia without evidence of DKA. Anion gap. 16. No ketones in urine. No ketones in serum.   HR improved to 90s after fluids.  Orthostatics negative. Imaging negative for acute process.  Patient tolerating PO in the ED.  Minimal diarrhea in ED.  GI pathogens sent.  C dif negative.  BP in 90s at baseline per patient.  HR 100.  Denies dizziness or lightheadedness. Tolerating PO and requesting to go home.  Sugar 183 on patient's own recheck.   Follow up with PCP and Dr. Overton Mam this week. Return to the ED with worsening symptoms.  BP 94/59  Pulse 102  Temp(Src) 97.8 F (36.6 C) (Oral)  Resp 16  SpO2 100%  LMP 07/07/2013   Ezequiel Essex, MD 07/14/13 [phone]

## 2013-07-14 NOTE — ED Notes (Signed)
CBG 233 

## 2013-07-14 NOTE — Discharge Instructions (Signed)
Abdominal Pain  followup with Dr. Overton Mam this week. Return to the ED if you develop new or worsening symptoms. Many things can cause abdominal pain. Usually, abdominal pain is not caused by a disease and will improve without treatment. It can often be observed and treated at home. Your health care provider will do a physical exam and possibly order blood tests and X-rays to help determine the seriousness of your pain. However, in many cases, more time must pass before a clear cause of the pain can be found. Before that point, your health care provider may not know if you need more testing or further treatment. HOME CARE INSTRUCTIONS  Monitor your abdominal pain for any changes. The following actions may help to alleviate any discomfort you are experiencing:  Only take over-the-counter or prescription medicines as directed by your health care provider.  Do not take laxatives unless directed to do so by your health care provider.  Try a clear liquid diet (broth, tea, or water) as directed by your health care provider. Slowly move to a bland diet as tolerated. SEEK MEDICAL CARE IF:  You have unexplained abdominal pain.  You have abdominal pain associated with nausea or diarrhea.  You have pain when you urinate or have a bowel movement.  You experience abdominal pain that wakes you in the night.  You have abdominal pain that is worsened or improved by eating food.  You have abdominal pain that is worsened with eating fatty foods.  You have a fever. SEEK IMMEDIATE MEDICAL CARE IF:   Your pain does not go away within 2 hours.  You keep throwing up (vomiting).  Your pain is felt only in portions of the abdomen, such as the right side or the left lower portion of the abdomen.  You pass bloody or black tarry stools. MAKE SURE YOU:  Understand these instructions.   Will watch your condition.   Will get help right away if you are not doing well or get worse.  Document Released:  10/13/2004 Document Revised: 01/08/2013 Document Reviewed: 09/12/2012 [facility] Patient Information 2015 [place], [place]. This information is not intended to replace advice given to you by your health care provider. Make sure you discuss any questions you have with your health care provider.

## 2013-07-14 NOTE — ED Notes (Signed)
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

## 2013-07-16 LAB — GI PATHOGEN PANEL BY PCR, STOOL
C difficile toxin A/B: NEGATIVE
Campylobacter by PCR: NEGATIVE
Cryptosporidium by PCR: NEGATIVE
E coli (ETEC) LT/ST: NEGATIVE
E coli (STEC): NEGATIVE
E coli 0157 by PCR: NEGATIVE
G LAMBLIA BY PCR: NEGATIVE
Norovirus GI/GII: NEGATIVE
ROTAVIRUS A BY PCR: NEGATIVE
SALMONELLA BY PCR: NEGATIVE
SHIGELLA BY PCR: NEGATIVE

## 2013-08-30 ENCOUNTER — Other Ambulatory Visit (HOSPITAL_COMMUNITY): Payer: Self-pay | Admitting: Gastroenterology

## 2013-08-30 DIAGNOSIS — R112 Nausea with vomiting, unspecified: Secondary | ICD-10-CM

## 2013-09-06 ENCOUNTER — Encounter (HOSPITAL_COMMUNITY)
Admission: RE | Admit: 2013-09-06 | Discharge: 2013-09-06 | Disposition: A | Discharge status: Home / Self Care | Payer: BC Managed Care – PPO | Source: Ambulatory Visit | Attending: Gastroenterology | Admitting: Gastroenterology

## 2013-09-06 DIAGNOSIS — R112 Nausea with vomiting, unspecified: Secondary | ICD-10-CM | POA: Insufficient documentation

## 2013-09-06 MED ORDER — TECHNETIUM TC 99M SULFUR COLLOID
2.0000 | Freq: Once | INTRAVENOUS | Status: AC | PRN
  Administered 2013-09-06: 2 via ORAL

## 2014-07-25 DIAGNOSIS — R42 Dizziness and giddiness: Secondary | ICD-10-CM | POA: Insufficient documentation

## 2014-07-25 DIAGNOSIS — H903 Sensorineural hearing loss, bilateral: Secondary | ICD-10-CM | POA: Insufficient documentation

## 2014-08-15 DIAGNOSIS — H5500 Unspecified nystagmus: Secondary | ICD-10-CM | POA: Insufficient documentation

## 2014-08-15 DIAGNOSIS — H5340 Unspecified visual field defects: Secondary | ICD-10-CM | POA: Insufficient documentation

## 2014-08-20 ENCOUNTER — Encounter: Payer: Medicaid Other | Attending: Gastroenterology | Admitting: *Deleted

## 2014-08-20 VITALS — Ht 62.5 in | Wt 158.7 lb

## 2014-08-20 DIAGNOSIS — Z713 Dietary counseling and surveillance: Secondary | ICD-10-CM | POA: Insufficient documentation

## 2014-08-20 DIAGNOSIS — IMO0002 Reserved for concepts with insufficient information to code with codable children: Secondary | ICD-10-CM

## 2014-08-20 DIAGNOSIS — E108 Type 1 diabetes mellitus with unspecified complications: Secondary | ICD-10-CM | POA: Diagnosis present

## 2014-08-20 DIAGNOSIS — E1065 Type 1 diabetes mellitus with hyperglycemia: Secondary | ICD-10-CM | POA: Diagnosis not present

## 2014-08-20 DIAGNOSIS — Z794 Long term (current) use of insulin: Secondary | ICD-10-CM | POA: Insufficient documentation

## 2014-08-20 NOTE — Patient Instructions (Signed)
Consider small frequent meals with snacks as needed Continue with lower fat choices as fat delays digestion in the stomach Peeling fruit will reduce the fiber content Consider setting the High Alert on the Dexcom with the Snooze/Repeat at minimum of 90 minutes to 2 hours Consider correcting high BGs throughout the day to help bring A1c down more quickly I have provided information on Gluten for your information at your request.

## 2014-08-22 NOTE — Progress Notes (Signed)
Diabetes Self-Management Education  Visit Type: First/Initial  Appt. Start Time: 1530 Appt. End Time: 1700  08/22/2014  [name], identified by name and date of birth, is a 29 y.o. female with a diagnosis of Diabetes: Type 1.  Other people present during visit:  Patient, Parent   ASSESSMENT  Height 5' 2.5" (1.588 m), weight 158 lb 11.2 oz (71.986 kg). Body mass index is 28.55 kg/(m^2).  Initial Visit Information:  Are you currently following a meal plan?: No   Are you taking your medications as prescribed?: Yes Are you checking your feet?: Yes How many days per week are you checking your feet?: 4   What is the last grade level you completed in school?: 4 YEARS COLLEGE  Psychosocial:     Patient Belief/Attitude about Diabetes: Motivated to manage diabetes (it's exhausting) Self-management support: Friends, [place] office Other persons present: Patient, Parent Patient Concerns: Nutrition/Meal planning Preferred Learning Style: Auditory, Visual, Hands on Learning Readiness: Change in progress  Complications:   Last HgB A1C per patient/outside source: 9.2 % How often do you check your blood sugar?: 1-2 times/day Number of hypoglycemic episodes per month: 8 Have you had a dilated eye exam in the past 12 months?: Yes Have you had a dental exam in the past 12 months?: Yes  Diet Intake:  Breakfast: varies each day: eggs and fruit or bread OR banana PNB, occasionally with skim milk or Lactaid milk Snack (morning): only if low BG, coffee with creamer Lunch: sandwich OR some type of meat or cheese with fruit and maybe chips or crackers or nut bars, water or diet soda Snack (afternoon): occasionally crackers  Dinner: parents cook this meal: meat, vegetables, fruit, ocasionally a starchy vegetable OR meal salads lately, water Snack (evening): usually popcorn, crackers or chips Beverage(s): coffee, water, diet soda, skim or lactaid milk  Exercise:  Exercise: Light  (walking / raking leaves) (swimming some in summer, gym 2-3 times a week- walks or runs and weights)  Individualized Plan for Diabetes Self-Management Training:   Learning Objective:  Patient will have a greater understanding of diabetes self-management.  Patient education plan per assessed needs and concerns is to attend individual sessions for     Education Topics Reviewed with Patient Today:  Definition of diabetes, type 1 and 2, and the diagnosis of diabetes Food label reading, portion sizes and measuring food., Carbohydrate counting, Role of diet in the treatment of diabetes and the relationship between the three main macronutrients and blood glucose level, Meal options for control of blood glucose level and chronic complications. (Patient has gastroparesis)   Reviewed patients medication for diabetes, action, purpose, timing of dose and side effects. (due to gastroparesis, discussed insulin action and bolus ideas with Omni Pod pump) Identified appropriate SMBG and/or A1C goals.   Identified and discussed with patient  current chronic complications Role of stress on diabetes, Other (comment) (Informed her of the DM 1 Support Group and provided calendar of events)      PATIENTS GOALS/Plan (Developed by the patient):  Nutrition: Follow meal plan discussed (Carb Counting by Food Group) Physical Activity: 15 minutes per day Medications: take my medication as prescribed Monitoring : test blood glucose pre and post meals as discussed (She is also wearing Dexcom CGM) Reducing Risk: treat hypoglycemia with 15 grams of carbs if blood glucose less than 70mg /dL  Plan:   Patient Instructions  Consider small frequent meals with snacks as needed Continue with lower fat choices as fat delays digestion in the  stomach Peeling fruit will reduce the fiber content Consider setting the High Alert on the Dexcom with the Snooze/Repeat at minimum of 90 minutes to 2 hours Consider correcting high BGs  throughout the day to help bring A1c down more quickly I have provided information on Gluten for your information at your request.   Expected Outcomes:  Demonstrated interest in learning. Expect positive outcomes  Education material provided: Living Well with Diabetes, Meal plan card, Support group flyer and Carbohydrate counting sheet  If problems or questions, patient to contact team via:  Phone and Email  Future DSME appointment:

## 2015-11-09 ENCOUNTER — Ambulatory Visit: Payer: Self-pay | Admitting: Pediatrics

## 2015-11-16 ENCOUNTER — Ambulatory Visit (INDEPENDENT_AMBULATORY_CARE_PROVIDER_SITE_OTHER): Payer: Medicaid Other | Admitting: Pediatrics

## 2015-11-16 ENCOUNTER — Encounter: Payer: Self-pay | Admitting: Pediatrics

## 2015-11-16 VITALS — BP 112/78 | HR 84 | Temp 98.4°F | Resp 16 | Ht 63.0 in | Wt 160.6 lb

## 2015-11-16 DIAGNOSIS — C716 Malignant neoplasm of cerebellum: Secondary | ICD-10-CM | POA: Diagnosis not present

## 2015-11-16 DIAGNOSIS — L853 Xerosis cutis: Secondary | ICD-10-CM | POA: Diagnosis not present

## 2015-11-16 DIAGNOSIS — J4530 Mild persistent asthma, uncomplicated: Secondary | ICD-10-CM | POA: Diagnosis not present

## 2015-11-16 DIAGNOSIS — R682 Dry mouth, unspecified: Secondary | ICD-10-CM | POA: Insufficient documentation

## 2015-11-16 DIAGNOSIS — J301 Allergic rhinitis due to pollen: Secondary | ICD-10-CM | POA: Diagnosis not present

## 2015-11-16 DIAGNOSIS — E109 Type 1 diabetes mellitus without complications: Secondary | ICD-10-CM

## 2015-11-16 DIAGNOSIS — T7800XA Anaphylactic reaction due to unspecified food, initial encounter: Secondary | ICD-10-CM | POA: Insufficient documentation

## 2015-11-16 HISTORY — DX: Malignant neoplasm of cerebellum: C71.6

## 2015-11-16 MED ORDER — MONTELUKAST SODIUM 10 MG PO TABS: 10.0000 mg | ORAL_TABLET | Freq: Every day | ORAL | 5 refills | Status: DC

## 2015-11-16 MED ORDER — LORATADINE 10 MG PO TABS: 10.0000 mg | ORAL_TABLET | Freq: Every day | ORAL | 5 refills | Status: DC

## 2015-11-16 MED ORDER — ALBUTEROL SULFATE HFA 108 (90 BASE) MCG/ACT IN AERS: 2.0000 | INHALATION_SPRAY | RESPIRATORY_TRACT | 1 refills | Status: DC | PRN

## 2015-11-16 NOTE — Patient Instructions (Addendum)
Environmental control of dust mite and mold Claritin 10 mg once a day if needed for runny nose or itchy eyes Fluticasone 1 spray per nostril twice a day if needed for stuffy nose Montelukast  10 mg once a day for coughing or wheezing Pro-air 2 puffs every 4 hours if needed for wheezing or coughing spells Call me if you're not doing better on this treatment plan You may try coconut oil on your years in the back of the neck to see if it helps with the dryness  Your skin testing for banana, carrot and  tree nuts was negative. I will let you know the results of your blood tests for these foods. Avoid them for now until you hear from me  next week

## 2015-11-16 NOTE — Progress Notes (Addendum)
[place] [id number] Dept: [phone]  New Patient Note  Patient ID: Tracey Perkins, female    DOB: Dec 08, 1985  Age: 30 y.o. MRN: [id number] Date of Office Visit: 11/16/2015 Referring provider: Prince Solian, MD [place], [place] [address]    Chief Complaint: New Patient (Initial Visit) (pt has hay fever, med allergies. Pt ate a banana a month ago and felt some tingling and warmth in her tounge. She ate some carrott cake last week and had some tounge warmness.)  HPI Tracey Perkins presents for evaluation of possible food allergies, possible asthma and nasal congestion. She uses an insulin pump. She has had type 1 diabetes for 21 years. After eating a banana she noted burning of her gums. After eating a carrot cake with walnuts she noticed some burning of her tongue. 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 Her cancer is under control. She also has had a dermatitis in the back of her neck and ears following radiation. She has had seasonal allergic rhinitis for several years and was tested a few years ago, and  found to be allergic to pollens, molds and dust. She has aggravation of her nasal congestion exposure to dust, cigarette smoke and weather changes. She also has been having some shortness of breath and at times a cough..  Review of Systems  Constitutional: Negative.   HENT:       Nasal congestion off and on for several years  Eyes: Negative.   Respiratory:       Coughing and shortness of breath at times without a clear-cut reason for several years  Cardiovascular: Negative.   Gastrointestinal:       Gastroparesis  Genitourinary: Negative.   Musculoskeletal: Negative.   Skin:       Very dry skin following radiation for medulloblastoma. Hair loss in the back of her skull. 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 No subsequent difficulties    Endo/Heme/Allergies:       Insulin-dependent type 1 diabetes for 21 years. Low  Thyroid function  Psychiatric/Behavioral: Negative.     Outpatient Encounter Prescriptions as of 11/16/2015  Medication Sig  . fluticasone (FLONASE) 50 MCG/ACT nasal spray Place 1 spray into the nose 2 (two) times daily.  . insulin aspart (NOVOLOG) 100 UNIT/ML FlexPen Inject into the skin.  . Insulin Human (INSULIN PUMP) SOLN Inject into the skin. novolog  . levothyroxine (SYNTHROID, LEVOTHROID) 88 MCG tablet Take 88 mcg by mouth daily before breakfast.  . lubiprostone (AMITIZA) 8 MCG capsule Take 8 mcg by mouth 2 (two) times daily with a meal.  . magnesium oxide (MAG-OX) 400 MG tablet Take 800 mg by mouth at bedtime.  . Multiple Vitamin (MULTIVITAMIN) capsule Take 1 capsule by mouth daily.  . potassium chloride (K-DUR,KLOR-CON) 10 MEQ tablet Take 10 mEq by mouth daily.  . Probiotic Product (VSL#3 DS) PACK Take by mouth 2 (two) times daily.  Marland Kitchen albuterol (PROAIR HFA) 108 (90 Base) MCG/ACT inhaler Inhale 2 puffs into the lungs every 4 (four) hours as needed for wheezing or shortness of breath.  Marland Kitchen aspirin 81 MG tablet Take 81 mg by mouth daily.  Marland Kitchen loratadine (CLARITIN) 10 MG tablet Take 1 tablet (10 mg total) by mouth daily.  . metoCLOPramide (REGLAN) 5 MG tablet Take 5 mg by mouth 4 (four) times daily as needed for nausea.  . montelukast (SINGULAIR)  10 MG tablet Take 1 tablet (10 mg total) by mouth at bedtime.  . norgestimate-ethinyl estradiol (SPRINTEC 28) 0.25-35 MG-MCG tablet Take 1 tablet by mouth daily.  . ondansetron (ZOFRAN ODT) 4 MG disintegrating tablet Take 1 tablet (4 mg total) by mouth every 4 (four) hours as needed for nausea or vomiting. (Patient not taking: Reported on 11/16/2015)  . ondansetron (ZOFRAN) 4 MG tablet Take 1 tablet (4 mg total) by mouth every 6 (six) hours. (Patient not taking: Reported on 11/16/2015)   No facility-administered encounter medications on file as of 11/16/2015.       Drug Allergies:  Allergies  Allergen Reactions  . Doxycycline Hives  . Gentian Violet-Proflavine Sulfate [Triple Dye] Other (See Comments)    Mouth burn  . Morphine And Related Hives  . Vancomycin Other (See Comments)    Red man syndrome     Family History: Corynn's family history includes Eczema in her sister; Thyroid disease in her mother, sister, and sister..Asthma in some cousins. There is no family history of angioedema, hives, food allergies, chronic bronchitis or emphysema.  Social and environmental. There are no pets in the home. She is not exposed to cigarette smoking. She has not smoked cigarettes. She works as a Oceanographer  Physical Exam: BP 112/78   Pulse 84   Temp 98.4 F (36.9 C) (Oral)   Resp 16   Ht 5\' 3"  (1.6 m)   Wt 160 lb 9.6 oz (72.8 kg)   BMI 28.45 kg/m    Physical Exam  Constitutional: She is oriented to person, place, and time. She appears well-developed and well-nourished.  HENT:  Eyes normal. Ears normal. Nose mild swelling of nasal turbinates. Pharynx normal.  Neck: Neck supple. No thyromegaly present.  Cardiovascular:  S1 and S2 normal no murmurs  Pulmonary/Chest:  Clear to percussion and auscultation  Abdominal: Soft. There is no tenderness (no hepatosplenomegaly).  Lymphadenopathy:    She has no cervical adenopathy.  Neurological: She is alert and oriented to person, place, and time.  Skin:  Dry particularly in the back of her neck and ears. Some scaling noted around her auricles  Psychiatric: She has a normal mood and affect. Her behavior is normal. Judgment and thought content normal.  Vitals reviewed.   Diagnostics: FVC 2.17 L FEV1 1.69 L. Predicted FVC 3.66 L predicted FEV1 3.12 L. After albuterol 2 puffs FVC 2.29 L FEV1 2.08 L-this shows a moderate reduction in the forced vital capacity and FEV1 with significant improvement in the FEV1 after albuterol  Allergy skin tests were positive to tree pollens, some molds and dust  mite. Skin testing to banana, carrot and tree nuts was negative.   Assessment  Assessment and Plan: 1. Mild persistent asthma without complication   2. Acute seasonal allergic rhinitis due to pollen   3. Anaphylactic shock due to food, initial encounter   4. Medulloblastoma ([place])   5. Type 1 diabetes mellitus without complication (HCC)   6. Xerosis cutis   7. Dry mouth     Meds ordered this encounter  Medications  . loratadine (CLARITIN) 10 MG tablet    Sig: Take 1 tablet (10 mg total) by mouth daily.    Dispense:  30 tablet    Refill:  5  . montelukast (SINGULAIR) 10 MG tablet    Sig: Take 1 tablet (10 mg total) by mouth at bedtime.    Dispense:  30 tablet    Refill:  5  . albuterol (PROAIR HFA) 108 (90  Base) MCG/ACT inhaler    Sig: Inhale 2 puffs into the lungs every 4 (four) hours as needed for wheezing or shortness of breath.    Dispense:  1 Inhaler    Refill:  1    Patient Instructions  Environmental control of dust mite and mold Claritin 10 mg once a day if needed for runny nose or itchy eyes Fluticasone 1 spray per nostril twice a day if needed for stuffy nose Montelukast  10 mg once a day for coughing or wheezing Pro-air 2 puffs every 4 hours if needed for wheezing or coughing spells Call me if you're not doing better on this treatment plan You may try coconut oil on your years in the back of the neck to see if it helps with the dryness  Your skin testing for banana, carrot and  tree nuts was negative. I will let you know the results of your blood tests for these foods. Avoid them for now until you hear from me  next week     Return in about 4 weeks (around 12/14/2015).   Thank you for the opportunity to care for this patient.  Please do not hesitate to contact me with questions.  Penne Lash, M.D.  [facility] [address] [place], [place] [address] [phone]

## 2015-11-19 DIAGNOSIS — J4521 Mild intermittent asthma with (acute) exacerbation: Secondary | ICD-10-CM | POA: Insufficient documentation

## 2015-11-22 LAB — ALLERGENS(7)
Brazil Nut IgE: <0.1 kU/L
F020-IgE Almond: <0.1 kU/L
Hazelnut (Filbert) IgE: <0.1 kU/L
Pecan Nut IgE: <0.1 kU/L
Walnut IgE: <0.1 kU/L

## 2015-11-22 LAB — ALLERGEN BANANA: F092-IGE BANANA: 0.1 kU/L — AB

## 2015-11-22 LAB — ALLERGEN CARROT: Allergen Carrot IgE: <0.1 kU/L

## 2015-12-21 ENCOUNTER — Ambulatory Visit: Payer: Medicaid Other | Admitting: Pediatrics

## 2015-12-22 ENCOUNTER — Ambulatory Visit: Payer: Medicaid Other | Admitting: Pediatrics

## 2016-05-26 ENCOUNTER — Ambulatory Visit (INDEPENDENT_AMBULATORY_CARE_PROVIDER_SITE_OTHER): Payer: Medicaid Other | Admitting: Otolaryngology

## 2016-05-26 DIAGNOSIS — H6123 Impacted cerumen, bilateral: Secondary | ICD-10-CM

## 2016-05-26 DIAGNOSIS — H903 Sensorineural hearing loss, bilateral: Secondary | ICD-10-CM

## 2016-06-30 ENCOUNTER — Emergency Department (HOSPITAL_COMMUNITY): Payer: Medicaid Other

## 2016-06-30 ENCOUNTER — Encounter (HOSPITAL_COMMUNITY): Payer: Self-pay

## 2016-06-30 ENCOUNTER — Emergency Department (HOSPITAL_COMMUNITY)
Admission: EM | Admit: 2016-06-30 | Discharge: 2016-06-30 | Disposition: A | Discharge status: Home / Self Care | Payer: Medicaid Other | Attending: Emergency Medicine | Admitting: Emergency Medicine

## 2016-06-30 DIAGNOSIS — R109 Unspecified abdominal pain: Secondary | ICD-10-CM | POA: Diagnosis present

## 2016-06-30 DIAGNOSIS — Z79899 Other long term (current) drug therapy: Secondary | ICD-10-CM | POA: Insufficient documentation

## 2016-06-30 DIAGNOSIS — K529 Noninfective gastroenteritis and colitis, unspecified: Secondary | ICD-10-CM | POA: Insufficient documentation

## 2016-06-30 DIAGNOSIS — E039 Hypothyroidism, unspecified: Secondary | ICD-10-CM | POA: Diagnosis not present

## 2016-06-30 DIAGNOSIS — E1040 Type 1 diabetes mellitus with diabetic neuropathy, unspecified: Secondary | ICD-10-CM | POA: Insufficient documentation

## 2016-06-30 DIAGNOSIS — J45909 Unspecified asthma, uncomplicated: Secondary | ICD-10-CM | POA: Insufficient documentation

## 2016-06-30 LAB — COMPREHENSIVE METABOLIC PANEL
ALT: 24 U/L (ref 14–54)
AST: 34 U/L (ref 15–41)
Albumin: 3.6 g/dL (ref 3.5–5.0)
Alkaline Phosphatase: 71 U/L (ref 38–126)
Anion gap: 8 (ref 5–15)
BILIRUBIN TOTAL: 0.4 mg/dL (ref 0.3–1.2)
BUN: 9 mg/dL (ref 6–20)
CHLORIDE: 97 mmol/L — AB (ref 101–111)
CO2: 25 mmol/L (ref 22–32)
CREATININE: 0.99 mg/dL (ref 0.44–1.00)
Calcium: 8.7 mg/dL — ABNORMAL LOW (ref 8.9–10.3)
GFR calc Af Amer: >60 mL/min (ref >60)
GFR calc non Af Amer: >60 mL/min (ref >60)
Glucose, Bld: 202 mg/dL — ABNORMAL HIGH (ref 65–99)
Potassium: 3.7 mmol/L (ref 3.5–5.1)
Sodium: 130 mmol/L — ABNORMAL LOW (ref 135–145)
Total Protein: 6.7 g/dL (ref 6.5–8.1)

## 2016-06-30 LAB — I-STAT CG4 LACTIC ACID, ED: LACTIC ACID, VENOUS: 1.71 mmol/L (ref 0.5–1.9)

## 2016-06-30 LAB — I-STAT BETA HCG BLOOD, ED (MC, WL, AP ONLY)

## 2016-06-30 LAB — URINALYSIS, ROUTINE W REFLEX MICROSCOPIC
BACTERIA UA: NONE SEEN
BILIRUBIN URINE: NEGATIVE
Glucose, UA: 150 mg/dL — AB
Ketones, ur: NEGATIVE mg/dL
Leukocytes, UA: NEGATIVE
Nitrite: NEGATIVE
PH: 7 (ref 5.0–8.0)
Protein, ur: NEGATIVE mg/dL
SPECIFIC GRAVITY, URINE: 1.013 (ref 1.005–1.030)

## 2016-06-30 LAB — DIFFERENTIAL
Basophils Absolute: 0 10*3/uL (ref 0.0–0.1)
Basophils Relative: 0 %
EOS ABS: 0 10*3/uL (ref 0.0–0.7)
EOS PCT: 0 %
LYMPHS ABS: 0.8 10*3/uL (ref 0.7–4.0)
Lymphocytes Relative: 8 %
MONO ABS: 0.6 10*3/uL (ref 0.1–1.0)
Monocytes Relative: 6 %
Neutro Abs: 8.6 10*3/uL — ABNORMAL HIGH (ref 1.7–7.7)
Neutrophils Relative %: 86 %

## 2016-06-30 LAB — CBG MONITORING, ED: GLUCOSE-CAPILLARY: 185 mg/dL — AB (ref 65–99)

## 2016-06-30 LAB — CBC
HCT: 40 % (ref 36.0–46.0)
Hemoglobin: 13.5 g/dL (ref 12.0–15.0)
MCH: 31 pg (ref 26.0–34.0)
MCHC: 33.8 g/dL (ref 30.0–36.0)
MCV: 92 fL (ref 78.0–100.0)
Platelets: 291 10*3/uL (ref 150–400)
RBC: 4.35 MIL/uL (ref 3.87–5.11)
RDW: 13.1 % (ref 11.5–15.5)
WBC: 10 10*3/uL (ref 4.0–10.5)

## 2016-06-30 LAB — LIPASE, BLOOD: Lipase: 19 U/L (ref 11–51)

## 2016-06-30 IMAGING — CT CT ABD-PELV W/ CM
2 of 4 series · 16 of 46 positions shown, 18 images · IV contrast (Omni 300)
Comparison: [DATE]

CLINICAL DATA: Abdominal pain, fever and constipation.  Diabetes.

EXAM:
CT ABDOMEN AND PELVIS WITH CONTRAST
TECHNIQUE: Multidetector CT imaging of the abdomen and pelvis was performed
using the standard protocol following bolus administration of
intravenous contrast.
CONTRAST:  100mL [0X] IOPAMIDOL ([0X]) INJECTION 61%

[Series 3: a/p w/ 5mm · axial · 0.67mm/px · z∈[+633,+1053]mm · 13 of 92 slices shown, 15 images]
[im 4/92  soft-tissue]
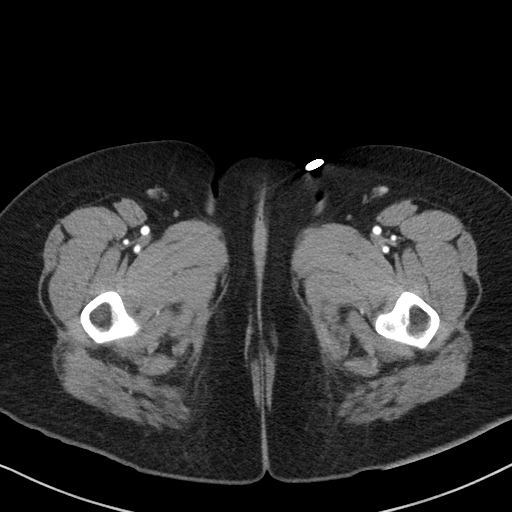
[im 4/92  bone]
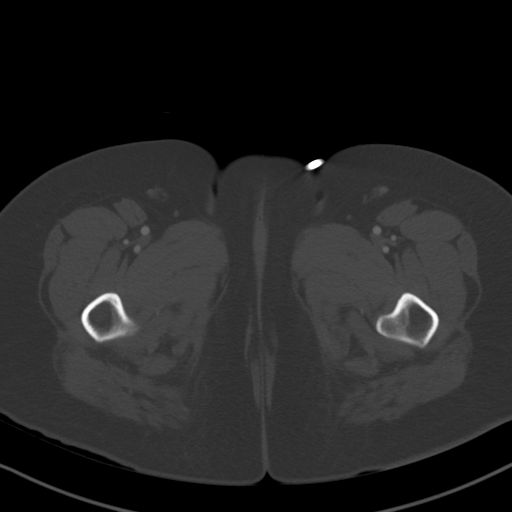
[im 12/92  soft-tissue]
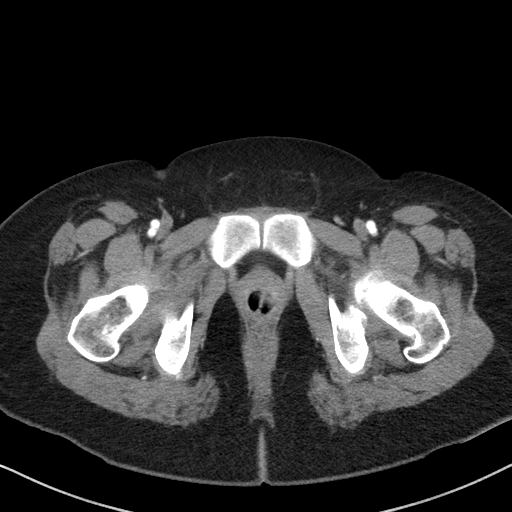
[im 20/92  soft-tissue]
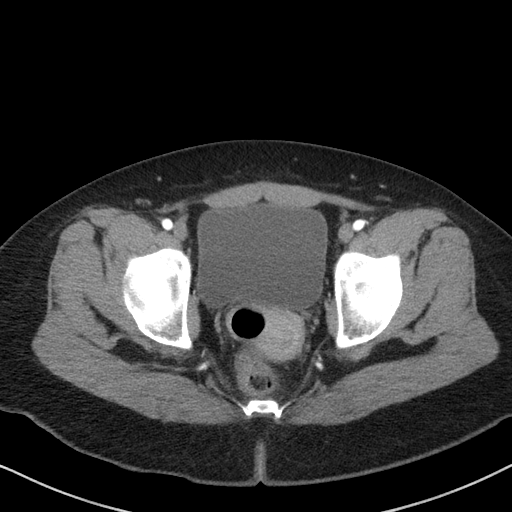
[im 24/92  soft-tissue]
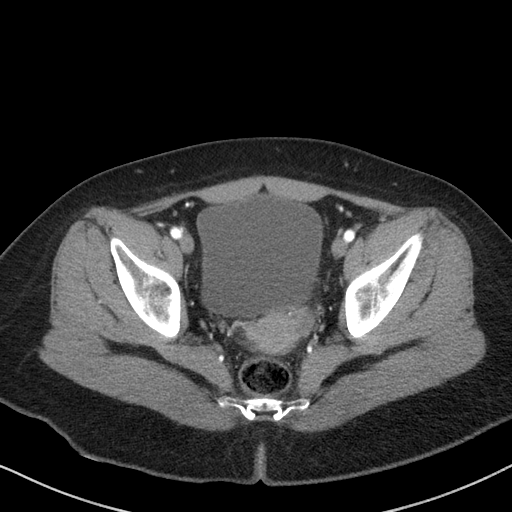
[im 32/92  soft-tissue]
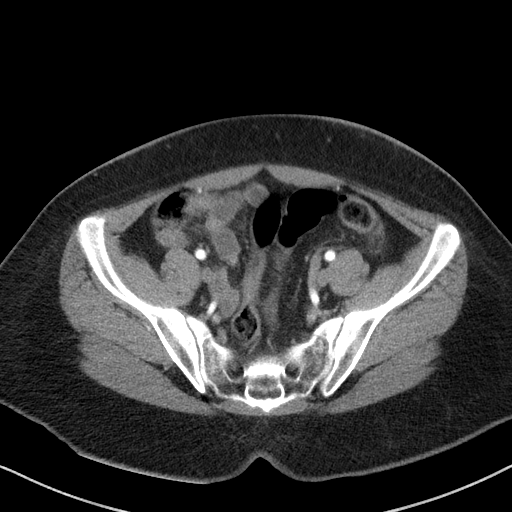
[im 40/92  soft-tissue]
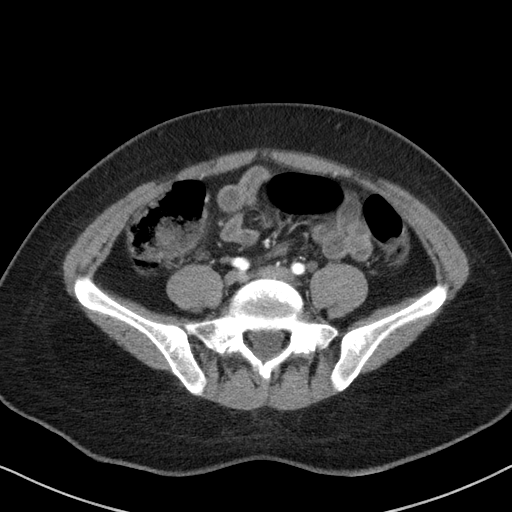
[im 48/92  soft-tissue]
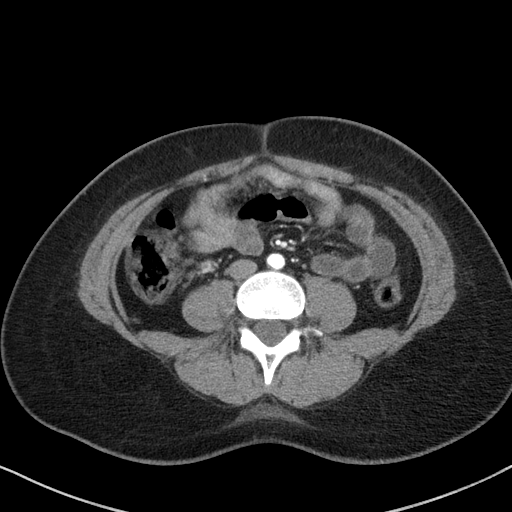
[im 52/92  soft-tissue]
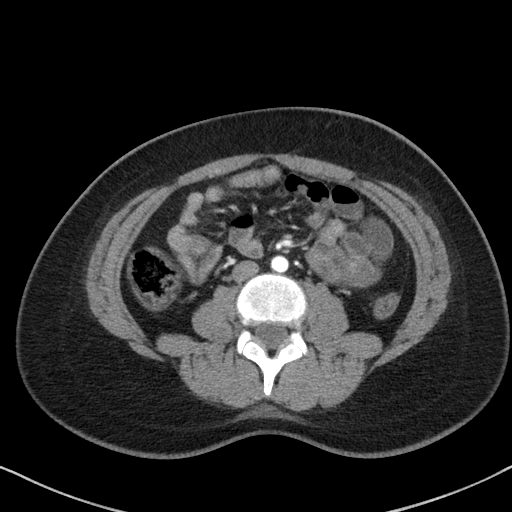
[im 60/92  soft-tissue]
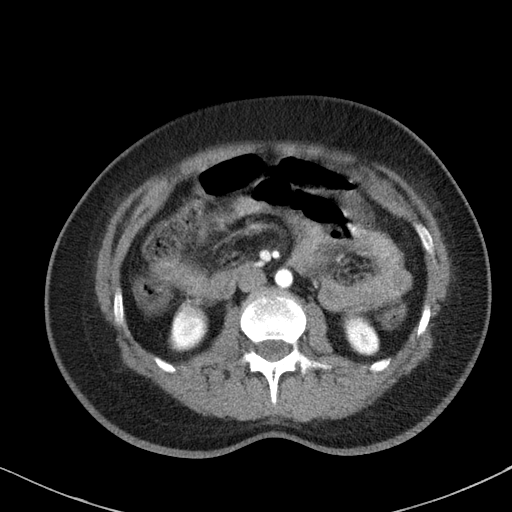
[im 60/92  bone]
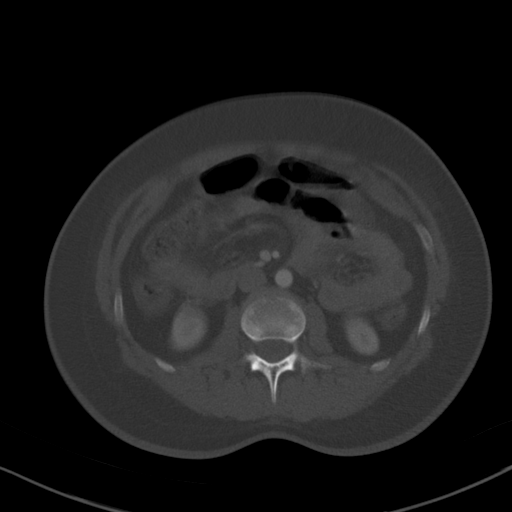
[im 68/92  soft-tissue]
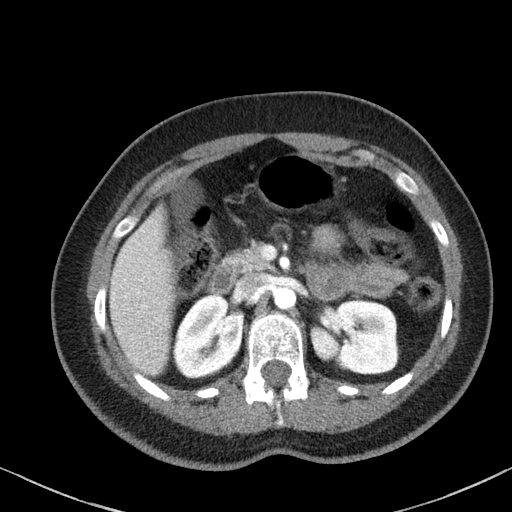
[im 72/92  soft-tissue]
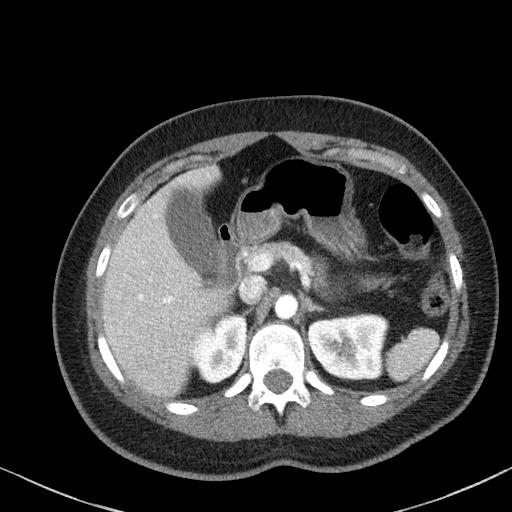
[im 80/92  soft-tissue]
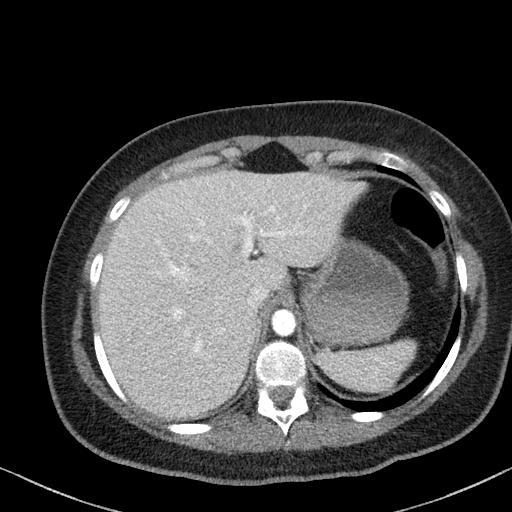
[im 88/92  soft-tissue]
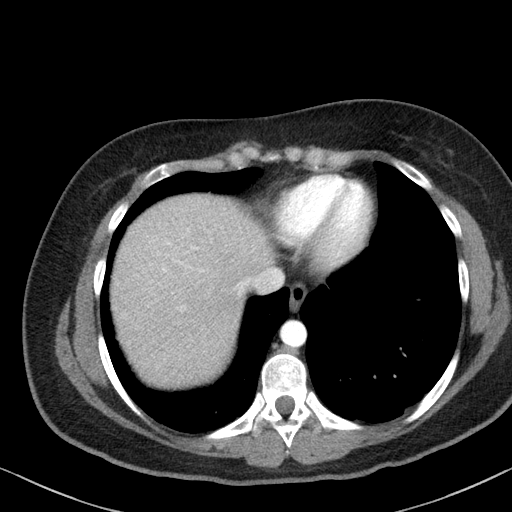

[Series 6: a/p w/ cor · coronal · 0.64mm/px · 3 of 127 slices shown]
[im 43/127  soft-tissue]
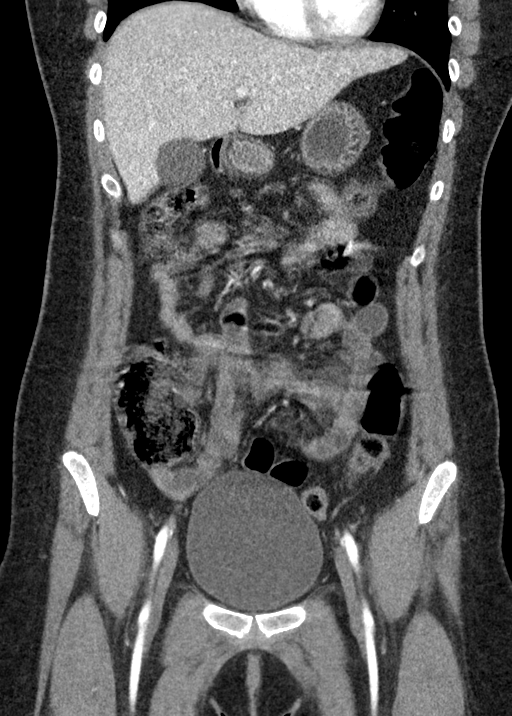
[im 57/127  soft-tissue]
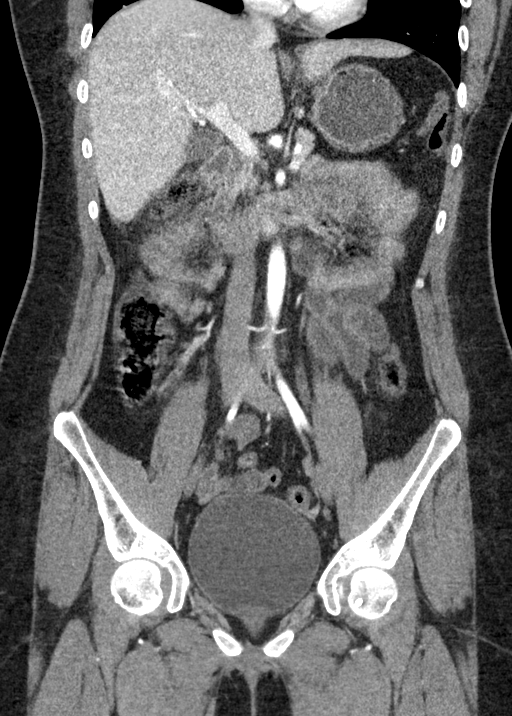
[im 71/127  soft-tissue]
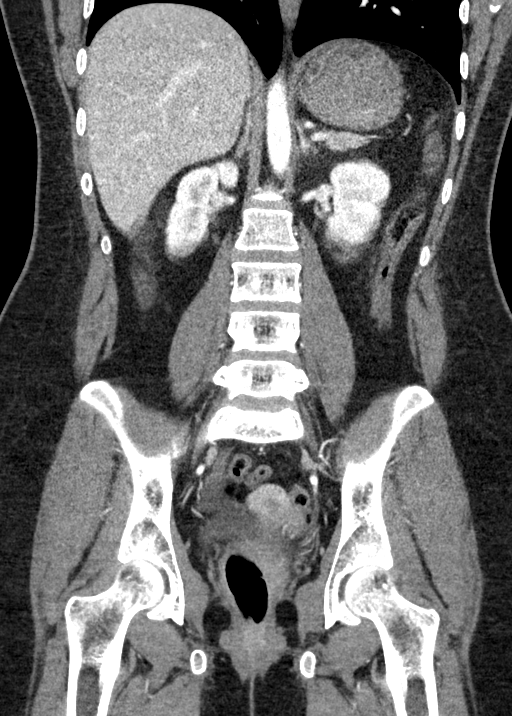

[16 of 46 positions shown; findings below may reference images not displayed]

FINDINGS: Lower chest: No acute abnormality.

Hepatobiliary: No focal liver abnormality is seen. No gallstones,
gallbladder wall thickening, or biliary dilatation.

Pancreas: Unremarkable. No pancreatic ductal dilatation or
surrounding inflammatory changes.

Spleen: Normal in size without focal abnormality.

Adrenals/Urinary Tract: Adrenal glands are unremarkable. Kidneys are
normal, without renal calculi, focal lesion, or hydronephrosis.
Bladder is unremarkable.

Stomach/Bowel: Limited by motion artifacts. There are mildly
distended fluid-filled loops of mid jejunum with areas of mucosal
enhancement and slight thickening suggested of the proximal jejunum
and ileum. Findings are nonspecific but may reflect a mild
enteritis. No mechanical source of obstruction is identified.
Moderate stool burden within the ascending colon. No pericecal
inflammation. What appears to be the appendix is normal.

Vascular/Lymphatic: No significant vascular findings are present. No
enlarged abdominal or pelvic lymph nodes.

Reproductive: Uterus and bilateral adnexa are unremarkable.

Other: No abdominal wall hernia or abnormality. No abdominopelvic
ascites.

Musculoskeletal: No acute or significant osseous findings.
IMPRESSION: Mild fluid-filled distention of mid jejunum with areas of mucosal
enhancement and slight mucosal thickening noted involving the
proximal jejunum and ileum. Findings are nonspecific but suggest an
enteritis.

## 2016-06-30 MED ORDER — HYDROCODONE-ACETAMINOPHEN 5-325 MG PO TABS: 1.0000 | ORAL_TABLET | ORAL | 0 refills | Status: DC | PRN

## 2016-06-30 MED ORDER — SODIUM CHLORIDE 0.9 % IV BOLUS (SEPSIS)
2000.0000 mL | Freq: Once | INTRAVENOUS | Status: AC
  Administered 2016-06-30: 2000 mL via INTRAVENOUS

## 2016-06-30 MED ORDER — IBUPROFEN 400 MG PO TABS
400.0000 mg | ORAL_TABLET | Freq: Once | ORAL | Status: AC
  Administered 2016-06-30: 400 mg via ORAL
  Filled 2016-06-30: qty 1

## 2016-06-30 MED ORDER — SUCRALFATE 1 GM/10ML PO SUSP: 1.0000 g | Freq: Three times a day (TID) | ORAL | 0 refills | Status: DC

## 2016-06-30 MED ORDER — ONDANSETRON 4 MG PO TBDP: 4.0000 mg | ORAL_TABLET | Freq: Three times a day (TID) | ORAL | 0 refills | Status: DC | PRN

## 2016-06-30 MED ORDER — IOPAMIDOL (ISOVUE-300) INJECTION 61%
INTRAVENOUS | Status: AC
  Administered 2016-06-30: 100 mL
  Filled 2016-06-30: qty 100

## 2016-06-30 MED ORDER — GLYCOPYRROLATE 0.2 MG/ML IJ SOLN
0.1000 mg | Freq: Once | INTRAMUSCULAR | Status: AC
  Administered 2016-06-30: 0.1 mg via INTRAVENOUS
  Filled 2016-06-30: qty 1

## 2016-06-30 MED ORDER — DICYCLOMINE HCL 20 MG PO TABS: 20.0000 mg | ORAL_TABLET | Freq: Two times a day (BID) | ORAL | 0 refills | Status: DC

## 2016-06-30 MED ORDER — SODIUM CHLORIDE 0.9 % IV SOLN: Freq: Once | INTRAVENOUS | Status: DC

## 2016-06-30 MED ORDER — ONDANSETRON HCL 4 MG/2ML IJ SOLN
4.0000 mg | Freq: Once | INTRAMUSCULAR | Status: AC
  Administered 2016-06-30: 4 mg via INTRAVENOUS
  Filled 2016-06-30: qty 2

## 2016-06-30 MED ORDER — PANTOPRAZOLE SODIUM 40 MG IV SOLR
40.0000 mg | Freq: Once | INTRAVENOUS | Status: AC
  Administered 2016-06-30: 40 mg via INTRAVENOUS
  Filled 2016-06-30: qty 40

## 2016-06-30 MED ORDER — SODIUM CHLORIDE 0.9 % IV BOLUS (SEPSIS): 1000.0000 mL | Freq: Once | INTRAVENOUS | Status: DC

## 2016-06-30 NOTE — Discharge Instructions (Signed)
Continue to push fluids. Zofran as needed for nausea. Bentyl for cramps.  Return to ER with any worsening.

## 2016-06-30 NOTE — ED Notes (Signed)
Unable to obtain labs at triage

## 2016-06-30 NOTE — ED Triage Notes (Signed)
Patient here from MD office for fever, abdominal pain, type 1 diabetes and constipation. Patient vomited pta, pale on arrival and appears sleepy. Alert and oriented. Had tylenol at 1pm.

## 2016-06-30 NOTE — ED Notes (Signed)
PT states understanding of care given, follow up care, and medication prescribed. PT ambulated from ED to car with a steady gait. 

## 2016-06-30 NOTE — ED Provider Notes (Signed)
31 yo type I diabetic here with nausea, vomiting, diarrheal illness and low-grade temp, likely 2/2 viral Gi illness. No abdominal pain, no WBC, no signs to suggest appendicitis, cholecystitis, or acute intra-abd pathology. At this time, pt receiving fluids with plan to d/c if UA is unremarkable. HR improving with fluids, temp control. No RLQ TTP. LA normal.  On my assessment, pt feels improved, appears flushed but is HDS. UA without pyuria. No ketones, no signs of DKA on lab work. Of note, pt now c/o periumbilical pain. She has a mild neutrophil predominance on diff. Discussed likelihood of viral GI illness with pt, mother, who are concerned given height of fever and reported h/o "intenstinal problems." CT scan ordered for eval of appendicitis, diverticulitis, other surgical/bacterial etiology. mIVF running. Suspect pt can be d/c if negative.  CT scan shows enteritis. Pt feels improved. D/c with supportive care.   Duffy Bruce, MD 07/01/16 [phone]

## 2016-06-30 NOTE — ED Provider Notes (Signed)
[facility] DEPT Provider Note   CSN: [id number] Arrival date & time: 06/30/16  1356     History   Chief Complaint Chief Complaint  Patient presents with  . Abdominal Pain  . Emesis    HPI Tracey Perkins is a 31 y.o. female. 31 year old female. History of insulin diabetes. His abdominal pain and constipation for the last several days with several episodes of emesis today. Noted to be febrile at her doctor's office. Given Tylenol. Transferred here.  Type I diabetic. Has good insight into her disease. Wasn't insulin pump. Has not discontinued this. Mom states she was taking some sips of fluid at home.  Heme-negative nonbilious emesis. Had difficulty passing a small bowel (morning. No diarrhea. No localizing or ongoing abdominal pain just occasional "cramps". Is making urine today.  HPI  Past Medical History:  Diagnosis Date  . Brain tumor ([place])   . Diabetes mellitus without complication ([place])   . Eczema   . Hypokalemia   . Hypomagnesemia   . Hypothyroid   . Neuropathy   . Thyroid disease     Patient Active Problem List   Diagnosis Date Noted  . Mild persistent asthma without complication 62/86/3817  . Acute seasonal allergic rhinitis due to pollen 11/16/2015  . Anaphylactic shock due to adverse food reaction 11/16/2015  . 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 Type 1 diabetes mellitus without complication ([place]) 71/16/5790  . Dry mouth 11/16/2015  . Xerosis cutis 11/16/2015    Past Surgical History:  Procedure Laterality Date  . BRAIN SURGERY    . Portacath placement and removed    . WISDOM TOOTH EXTRACTION      OB History    No data available       Home Medications    Prior to Admission medications   Medication Sig Start Date End Date Taking? Authorizing Provider  acetaminophen (TYLENOL) 500 MG tablet Take 500 mg by mouth as needed for pain.   Yes [provider]  albuterol (PROAIR HFA) 108 (90 Base) MCG/ACT inhaler Inhale 2 puffs into the  lungs every 4 (four) hours as needed for wheezing or shortness of breath. 11/16/15  Yes Bardelas, Jose A, MD  fluticasone (FLONASE) 50 MCG/ACT nasal spray Place 1 spray into the nose as needed.  08/04/15  Yes [provider]  gabapentin (NEURONTIN) 300 MG capsule Take 300 mg by mouth 2 (two) times daily.   Yes [provider]  glucagon (GLUCAGON EMERGENCY) 1 MG injection Inject 1 mg into the skin as needed. 06/30/16  Yes [provider]  Insulin Human (INSULIN PUMP) SOLN Inject into the skin. novolog   Yes [provider]  levothyroxine (SYNTHROID, LEVOTHROID) 88 MCG tablet Take 88 mcg by mouth daily before breakfast.   Yes [provider]  loratadine (CLARITIN) 10 MG tablet Take 1 tablet (10 mg total) by mouth daily. Patient taking differently: Take 10 mg by mouth at bedtime.  11/16/15  Yes Bardelas, Jose A, MD  lubiprostone (AMITIZA) 8 MCG capsule Take 8 mcg by mouth 2 (two) times daily with a meal.   Yes [provider]  magnesium oxide (MAG-OX) 400 MG tablet Take 800 mg by mouth at bedtime.   Yes [provider]  montelukast (SINGULAIR) 10 MG tablet Take 1 tablet (10 mg total) by mouth at bedtime. Patient taking differently: Take 10 mg by mouth daily.  11/16/15  Yes Bardelas, Jens Som, MD  Multiple Vitamin (MULTIVITAMIN) capsule Take 1 capsule by mouth daily.   Yes  [provider]  potassium chloride (K-DUR,KLOR-CON) 10 MEQ tablet Take 10 mEq by mouth daily.   Yes [provider]  Probiotic Product (VSL#3 DS) PACK Take by mouth 2 (two) times daily.   Yes [provider]  [name] 150-35 MCG/24HR transdermal patch Apply 1 packet topically once a week. 05/28/16  Yes [provider]  dicyclomine (BENTYL) 20 MG tablet Take 1 tablet (20 mg total) by mouth 2 (two) times daily. 06/30/16   Tanna Furry, MD  ondansetron (ZOFRAN ODT) 4 MG disintegrating tablet Take 1 tablet (4 mg total) by mouth every 8 (eight) hours  as needed for nausea. 06/30/16   Tanna Furry, MD    Family History Family History  Problem Relation Age of Onset  . Thyroid disease Mother   . Eczema Sister   . Thyroid disease Sister   . Thyroid disease Sister     Social History Social History  Substance Use Topics  . Smoking status: Never Smoker  . Smokeless tobacco: Never Used  . Alcohol use No     Allergies   Banana; Doxycycline; Gentian violet-proflavine sulfate [triple dye]; Morphine and related; and Vancomycin   Review of Systems Review of Systems  Constitutional: Positive for fever. Negative for appetite change, chills, diaphoresis and fatigue.  HENT: Negative for mouth sores, sore throat and trouble swallowing.   Eyes: Negative for visual disturbance.  Respiratory: Negative for cough, chest tightness, shortness of breath and wheezing.   Cardiovascular: Negative for chest pain.  Gastrointestinal: Positive for abdominal pain, constipation, nausea and vomiting. Negative for abdominal distention and diarrhea.  Endocrine: Negative for polydipsia, polyphagia and polyuria.  Genitourinary: Negative for dysuria, frequency and hematuria.  Musculoskeletal: Negative for gait problem.  Skin: Negative for color change, pallor and rash.  Neurological: Negative for dizziness, syncope, light-headedness and headaches.  Hematological: Does not bruise/bleed easily.  Psychiatric/Behavioral: Negative for behavioral problems and confusion.     Physical Exam Updated Vital Signs BP 112/81   Pulse (!) 118   Temp 100.3 F (37.9 C) (Oral)   Resp (!) 22   Ht 5\' 2"  (1.575 m)   Wt 81.6 kg (180 lb)   SpO2 97%   BMI 32.92 kg/m   Physical Exam  Constitutional: She is oriented to person, place, and time. She appears well-developed and well-nourished. No distress.  Awake and alert. Dry mouth. Eyes not sunken. Conjunctiva not pale.  HENT:  Head: Normocephalic.  Eyes: Conjunctivae are normal. Pupils are equal, round, and reactive to  light. No scleral icterus.  Neck: Normal range of motion. Neck supple. No thyromegaly present.  Cardiovascular: Normal rate and regular rhythm.  Exam reveals no gallop and no friction rub.   No murmur heard. Pulmonary/Chest: Effort normal and breath sounds normal. No respiratory distress. She has no wheezes. She has no rales.  Abdominal: Soft. Bowel sounds are normal. She exhibits no distension. There is no tenderness. There is no rebound.  Benign abdomen. No guarding or rebound.  Musculoskeletal: Normal range of motion.  Neurological: She is alert and oriented to person, place, and time.  Skin: Skin is warm and dry. No rash noted.  Psychiatric: She has a normal mood and affect. Her behavior is normal.     ED Treatments / Results  Labs (all labs ordered are listed, but only abnormal results are displayed) Labs Reviewed  COMPREHENSIVE METABOLIC PANEL - Abnormal; Notable for the following:       Result Value   Sodium 130 (*)    Chloride  97 (*)    Glucose, Bld 202 (*)    Calcium 8.7 (*)    All other components within normal limits  DIFFERENTIAL - Abnormal; Notable for the following:    Neutro Abs 8.6 (*)    All other components within normal limits  CBG MONITORING, ED - Abnormal; Notable for the following:    Glucose-Capillary 185 (*)    All other components within normal limits  LIPASE, BLOOD  CBC  URINALYSIS, ROUTINE W REFLEX MICROSCOPIC  I-STAT BETA HCG BLOOD, ED ([facility], [facility], [facility] ONLY)  I-STAT CG4 LACTIC ACID, ED  I-STAT CG4 LACTIC ACID, ED    EKG  EKG Interpretation None       Radiology No results found.  Procedures Procedures (including critical care time)  Medications Ordered in ED Medications  0.9 %  sodium chloride infusion (not administered)  ondansetron (ZOFRAN) injection 4 mg (4 mg Intravenous Given 06/30/16 1550)  glycopyrrolate (ROBINUL) injection 0.1 mg (0.1 mg Intravenous Given 06/30/16 1547)  pantoprazole (PROTONIX) injection 40 mg (40 mg Intravenous  Given 06/30/16 1542)  ibuprofen (ADVIL,MOTRIN) tablet 400 mg (400 mg Oral Given 06/30/16 1550)  sodium chloride 0.9 % bolus 2,000 mL (2,000 mLs Intravenous New Bag/Given 06/30/16 1539)     Initial Impression / Assessment and Plan / ED Course  I have reviewed the triage vital signs and the nursing notes.  Pertinent labs & imaging results that were available during my care of the patient were reviewed by me and considered in my medical decision making (see chart for details).   plan antiemetics, hydration. Check labs. Rule out DKA. No indication for imaging at this time no localizing tenderness or pain.  Weight differential. Taking some by mouth fluids. I think she would be appropriate for outpatient treatment. Awaiting additional fluids and urinalysis. Normal and tolerating by mouth would consider outpatient treatment with antiemetics or liquids continued insulin pump) follow-up return with worsening.  Final Clinical Impressions(s) / ED Diagnoses   Final diagnoses:  Gastroenteritis    New Prescriptions New Prescriptions   DICYCLOMINE (BENTYL) 20 MG TABLET    Take 1 tablet (20 mg total) by mouth 2 (two) times daily.   ONDANSETRON (ZOFRAN ODT) 4 MG DISINTEGRATING TABLET    Take 1 tablet (4 mg total) by mouth every 8 (eight) hours as needed for nausea.     Tanna Furry, MD 06/30/16 [phone]

## 2016-10-05 ENCOUNTER — Other Ambulatory Visit (HOSPITAL_COMMUNITY): Payer: Self-pay | Admitting: Respiratory Therapy

## 2016-10-05 DIAGNOSIS — R062 Wheezing: Secondary | ICD-10-CM

## 2016-10-06 ENCOUNTER — Ambulatory Visit (HOSPITAL_COMMUNITY)
Admission: RE | Admit: 2016-10-06 | Discharge: 2016-10-06 | Disposition: A | Discharge status: Home / Self Care | Payer: Medicaid Other | Source: Ambulatory Visit | Attending: Internal Medicine | Admitting: Internal Medicine

## 2016-10-06 DIAGNOSIS — J449 Chronic obstructive pulmonary disease, unspecified: Secondary | ICD-10-CM | POA: Diagnosis not present

## 2016-10-06 DIAGNOSIS — R062 Wheezing: Secondary | ICD-10-CM | POA: Diagnosis present

## 2016-10-06 LAB — PULMONARY FUNCTION TEST
DL/VA % pred: 134 %
DL/VA: 6.2 ml/min/mmHg/L
DLCO UNC: 15.49 ml/min/mmHg
DLCO unc % pred: 69 %
FEF 25-75 POST: 1.16 L/s
FEF 25-75 Pre: 1.28 L/sec
FEF2575-%CHANGE-POST: -9 %
FEF2575-%PRED-POST: 34 %
FEF2575-%PRED-PRE: 37 %
FEV1-%Change-Post: -7 %
FEV1-%PRED-POST: 46 %
FEV1-%Pred-Pre: 50 %
FEV1-POST: 1.43 L
FEV1-PRE: 1.55 L
FEV1FVC-%CHANGE-POST: -11 %
FEV1FVC-%PRED-PRE: 71 %
FEV6-%Change-Post: 3 %
FEV6-%Pred-Post: 74 %
FEV6-%Pred-Pre: 72 %
FEV6-Post: 2.67 L
FEV6-Pre: 2.58 L
FEV6FVC-%Pred-Post: 101 %
FEV6FVC-%Pred-Pre: 101 %
FVC-%Change-Post: 3 %
FVC-%PRED-PRE: 71 %
FVC-%Pred-Post: 74 %
FVC-POST: 2.67 L
FVC-PRE: 2.58 L
POST FEV1/FVC RATIO: 54 %
POST FEV6/FVC RATIO: 100 %
PRE FEV1/FVC RATIO: 60 %
Pre FEV6/FVC Ratio: 100 %

## 2016-10-06 MED ORDER — ALBUTEROL SULFATE (2.5 MG/3ML) 0.083% IN NEBU
2.5000 mg | INHALATION_SOLUTION | Freq: Once | RESPIRATORY_TRACT | Status: AC
  Administered 2016-10-06: 2.5 mg via RESPIRATORY_TRACT

## 2016-10-28 ENCOUNTER — Encounter: Payer: Self-pay | Admitting: *Deleted

## 2016-10-31 ENCOUNTER — Institutional Professional Consult (permissible substitution): Payer: Medicaid Other | Admitting: Emergency Medicine

## 2016-11-14 ENCOUNTER — Other Ambulatory Visit: Payer: Self-pay

## 2016-11-14 NOTE — Telephone Encounter (Signed)
Declined loratadine 10 mg. Pt last ov 10/2015

## 2016-11-22 ENCOUNTER — Other Ambulatory Visit: Payer: Self-pay | Admitting: Allergy

## 2016-11-28 ENCOUNTER — Other Ambulatory Visit: Payer: Self-pay | Admitting: Allergy

## 2016-11-28 MED ORDER — LORATADINE 10 MG PO TABS: 10.0000 mg | ORAL_TABLET | Freq: Every day | ORAL | 0 refills | Status: DC

## 2016-11-28 MED ORDER — MONTELUKAST SODIUM 10 MG PO TABS: 10.0000 mg | ORAL_TABLET | Freq: Every day | ORAL | 0 refills | Status: DC

## 2016-12-01 ENCOUNTER — Encounter: Payer: Self-pay | Admitting: Emergency Medicine

## 2016-12-01 ENCOUNTER — Ambulatory Visit: Payer: Medicaid Other | Admitting: Emergency Medicine

## 2016-12-01 DIAGNOSIS — J4530 Mild persistent asthma, uncomplicated: Secondary | ICD-10-CM

## 2016-12-01 DIAGNOSIS — J301 Allergic rhinitis due to pollen: Secondary | ICD-10-CM

## 2016-12-01 NOTE — Patient Instructions (Addendum)
Please continue Singulair and loratadine as you have been taking them Start using your fluticasone nasal spray 2 sprays each nostril every day Keep Pro Air (albuterol) available to use 2 puffs if needed for shortness of breath, wheezing.  Please keep track of how often you use this medication and whether it has helped you so that we can discuss in the future. We will hold off on repeating your pulmonary function testing for now.  We may need to consider doing so in the future depending on how your breathing progresses. Follow with Dr Lamonte Sakai in 3 months or sooner if you have any problems.

## 2016-12-01 NOTE — Assessment & Plan Note (Signed)
Please continue Singulair and loratadine as you have been taking them Start using your fluticasone nasal spray 2 sprays each nostril every day

## 2016-12-01 NOTE — Assessment & Plan Note (Signed)
She has symptoms that are consistent with both allergic rhinitis and possible upper airway disease versus asthma.  It may be that she has components of both.  She only intermittently responds to albuterol which is suggestive of a possible upper airway process at least at times.  I believe for now we can continue her albuterol as needed.  She will keep track of her use and whether it is beneficial.  We may need to ultimately repeat her pulmonary function testing since she had difficulty performing on the first occasion.

## 2016-12-01 NOTE — Progress Notes (Signed)
Subjective:    Patient ID: Tracey Perkins, female    DOB: 05-30-1985, 31 y.o.   MRN: [id number]  HPI 31 year old never smoker with a history of medulloblastoma treated with surgery and radiation therapy 1999, also type 1 diabetes since childhood on an insulin pump.  She carries a diagnosis and has been treated for suspected asthma.  She is referred today for wheezing and shortness of breath.  She underwent pulmonary function testing 10/05/16 but this was an inadequate study due to her technique.   She describes episodic dyspnea, became noticeable about 2 months ago, seems to correspond with her allergy symptoms, sinus pressure, drainage. She is sensitive to humidity, dust, sometimes flowers, cleaning solutions. She notices chest tightness. No real cough. She has heard what she describes as stridor that is transient. SABA seems to help but only for few minutes.   She is on Singulair, loratadine, fluticasone nasal spray as needed, has pro-air available to use if needed.   Review of Systems  Constitutional: Negative for fever and unexpected weight change.  HENT: Negative for congestion, dental problem, ear pain, nosebleeds, postnasal drip, rhinorrhea, sinus pressure, sneezing, sore throat and trouble swallowing.   Eyes: Negative for redness and itching.  Respiratory: Positive for shortness of breath and wheezing. Negative for cough and chest tightness.   Cardiovascular: Negative for palpitations and leg swelling.  Gastrointestinal: Negative for nausea and vomiting.  Genitourinary: Negative for dysuria.  Musculoskeletal: Negative for joint swelling.  Skin: Negative for rash.  Neurological: Negative for headaches.  Hematological: Does not bruise/bleed easily.  Psychiatric/Behavioral: Negative for dysphoric mood. The patient is not nervous/anxious.     Past Medical History:  Diagnosis Date  . Brain tumor ([place])   . Diabetes mellitus without complication ([place])   . Eczema   . Gastroparesis     . Hypokalemia   . Hypomagnesemia   . Hypothyroid   . Neuropathy   . Thyroid disease   . Vision changes      Family History  Problem Relation Age of Onset  . Thyroid disease Mother   . Eczema Sister   . Thyroid disease Sister   . Thyroid disease Sister      Social History   Socioeconomic History  . Marital status: Single    Spouse name: Not on file  . Number of children: Not on file  . Years of education: Not on file  . Highest education level: Not on file  Social Needs  . Financial resource strain: Not on file  . Food insecurity - worry: Not on file  . Food insecurity - inability: Not on file  . Transportation needs - medical: Not on file  . Transportation needs - non-medical: Not on file  Occupational History  . Not on file  Tobacco Use  . Smoking status: Never Smoker  . Smokeless tobacco: Never Used  Substance and Sexual Activity  . Alcohol use: No  . Drug use: No  . Sexual activity: Yes    Birth control/protection: Pill  Other Topics Concern  . Not on file  Social History Narrative  . Not on file     Allergies  Allergen Reactions  . Banana Other (See Comments)    Mouth burning and swollen  . Doxycycline Hives  . Gentian Violet-Proflavine Sulfate [Triple Dye] Other (See Comments)    Mouth burn  . Morphine And Related Hives  . Vancomycin Other (See Comments)    Red man syndrome      Outpatient Medications  Prior to Visit  Medication Sig Dispense Refill  . acetaminophen (TYLENOL) 500 MG tablet Take 500 mg by mouth as needed for pain.    Marland Kitchen albuterol (PROAIR HFA) 108 (90 Base) MCG/ACT inhaler Inhale 2 puffs into the lungs every 4 (four) hours as needed for wheezing or shortness of breath. 1 Inhaler 1  . fluticasone (FLONASE) 50 MCG/ACT nasal spray Place 1 spray into the nose as needed.     Marland Kitchen glucagon (GLUCAGON EMERGENCY) 1 MG injection Inject 1 mg into the skin as needed.    . Insulin Human (INSULIN PUMP) SOLN Inject into the skin. novolog    .  levothyroxine (SYNTHROID, LEVOTHROID) 88 MCG tablet Take 88 mcg by mouth daily before breakfast.    . loratadine (CLARITIN) 10 MG tablet Take 1 tablet (10 mg total) daily by mouth. 30 tablet 0  . lubiprostone (AMITIZA) 8 MCG capsule Take 8 mcg by mouth 2 (two) times daily with a meal.    . magnesium oxide (MAG-OX) 400 MG tablet Take 800 mg by mouth at bedtime.    . montelukast (SINGULAIR) 10 MG tablet Take 1 tablet (10 mg total) at bedtime by mouth. 30 tablet 0  . Multiple Vitamin (MULTIVITAMIN) capsule Take 1 capsule by mouth daily.    . ondansetron (ZOFRAN ODT) 4 MG disintegrating tablet Take 1 tablet (4 mg total) by mouth every 8 (eight) hours as needed for nausea. 6 tablet 0  . potassium chloride (K-DUR,KLOR-CON) 10 MEQ tablet Take 10 mEq by mouth daily.    . Probiotic Product (VSL#3 DS) PACK Take by mouth 2 (two) times daily.    [name] 150-35 MCG/24HR transdermal patch Apply 1 packet topically once a week.  11  . dicyclomine (BENTYL) 20 MG tablet Take 1 tablet (20 mg total) by mouth 2 (two) times daily. 20 tablet 0  . gabapentin (NEURONTIN) 300 MG capsule Take 300 mg by mouth 2 (two) times daily.    Marland Kitchen HYDROcodone-acetaminophen (NORCO/VICODIN) 5-325 MG tablet Take 1 tablet by mouth every 4 (four) hours as needed for severe pain. 6 tablet 0  . sucralfate (CARAFATE) 1 GM/10ML suspension Take 10 mLs (1 g total) by mouth 4 (four) times daily -  with meals and at bedtime. 420 mL 0   No facility-administered medications prior to visit.         Objective:   Physical Exam Vitals:   12/01/16 1529  BP: 114/76  Pulse: 91  SpO2: 98%  Weight: 176 lb (79.8 kg)  Height: 5\' 2"  (1.575 m)   Gen: Pleasant, well-nourished, in no distress,  normal affect  ENT: No lesions,  mouth clear,  oropharynx clear, no postnasal drip  Neck: No JVD, no TMG, no carotid bruits  Lungs: No use of accessory muscles, distant, clear without rales or rhonchi  Cardiovascular: RRR, heart sounds normal, no murmur or  gallops, no peripheral edema  Musculoskeletal: No deformities, no cyanosis or clubbing  Neuro: alert, non focal  Skin: Warm, no lesions or rashes     Assessment & Plan:  Mild persistent asthma without complication She has symptoms that are consistent with both allergic rhinitis and possible upper airway disease versus asthma.  It may be that she has components of both.  She only intermittently responds to albuterol which is suggestive of a possible upper airway process at least at times.  I believe for now we can continue her albuterol as needed.  She will keep track of her use and whether it is beneficial.  We may need to ultimately repeat her pulmonary function testing since she had difficulty performing on the first occasion.  Acute seasonal allergic rhinitis due to pollen Please continue Singulair and loratadine as you have been taking them Start using your fluticasone nasal spray 2 sprays each nostril every day  Baltazar Apo, MD, PhD 12/01/2016, 4:06 PM [facility] [phone] or if no answer [phone]

## 2016-12-10 ENCOUNTER — Emergency Department (HOSPITAL_COMMUNITY)
Admission: EM | Admit: 2016-12-10 | Discharge: 2016-12-10 | Disposition: A | Discharge status: Home / Self Care | Payer: Medicaid Other | Attending: Emergency Medicine | Admitting: Emergency Medicine

## 2016-12-10 ENCOUNTER — Other Ambulatory Visit: Payer: Self-pay

## 2016-12-10 ENCOUNTER — Encounter (HOSPITAL_COMMUNITY): Payer: Self-pay

## 2016-12-10 DIAGNOSIS — R197 Diarrhea, unspecified: Secondary | ICD-10-CM | POA: Insufficient documentation

## 2016-12-10 DIAGNOSIS — J45909 Unspecified asthma, uncomplicated: Secondary | ICD-10-CM | POA: Insufficient documentation

## 2016-12-10 DIAGNOSIS — Z794 Long term (current) use of insulin: Secondary | ICD-10-CM | POA: Insufficient documentation

## 2016-12-10 DIAGNOSIS — R1110 Vomiting, unspecified: Secondary | ICD-10-CM | POA: Diagnosis present

## 2016-12-10 DIAGNOSIS — E109 Type 1 diabetes mellitus without complications: Secondary | ICD-10-CM | POA: Diagnosis not present

## 2016-12-10 DIAGNOSIS — E039 Hypothyroidism, unspecified: Secondary | ICD-10-CM | POA: Insufficient documentation

## 2016-12-10 DIAGNOSIS — Z79899 Other long term (current) drug therapy: Secondary | ICD-10-CM | POA: Diagnosis not present

## 2016-12-10 DIAGNOSIS — R112 Nausea with vomiting, unspecified: Secondary | ICD-10-CM | POA: Diagnosis not present

## 2016-12-10 LAB — COMPREHENSIVE METABOLIC PANEL
ALK PHOS: 97 U/L (ref 38–126)
ALT: 20 U/L (ref 14–54)
ANION GAP: 12 (ref 5–15)
AST: 35 U/L (ref 15–41)
Albumin: 4.3 g/dL (ref 3.5–5.0)
BILIRUBIN TOTAL: 0.7 mg/dL (ref 0.3–1.2)
BUN: 11 mg/dL (ref 6–20)
CALCIUM: 9.5 mg/dL (ref 8.9–10.3)
CO2: 24 mmol/L (ref 22–32)
Chloride: 100 mmol/L — ABNORMAL LOW (ref 101–111)
Creatinine, Ser: 1.01 mg/dL — ABNORMAL HIGH (ref 0.44–1.00)
Glucose, Bld: 139 mg/dL — ABNORMAL HIGH (ref 65–99)
Potassium: 4.6 mmol/L (ref 3.5–5.1)
SODIUM: 136 mmol/L (ref 135–145)
TOTAL PROTEIN: 8.5 g/dL — AB (ref 6.5–8.1)

## 2016-12-10 LAB — CBC WITH DIFFERENTIAL/PLATELET
BASOS ABS: 0 10*3/uL (ref 0.0–0.1)
BASOS PCT: 0 %
Eosinophils Absolute: 0.1 10*3/uL (ref 0.0–0.7)
Eosinophils Relative: 1 %
HEMATOCRIT: 46 % (ref 36.0–46.0)
HEMOGLOBIN: 15.6 g/dL — AB (ref 12.0–15.0)
Lymphocytes Relative: 12 %
Lymphs Abs: 1.9 10*3/uL (ref 0.7–4.0)
MCH: 32.3 pg (ref 26.0–34.0)
MCHC: 33.9 g/dL (ref 30.0–36.0)
MCV: 95.2 fL (ref 78.0–100.0)
MONOS PCT: 8 %
Monocytes Absolute: 1.2 10*3/uL — ABNORMAL HIGH (ref 0.1–1.0)
NEUTROS ABS: 12.3 10*3/uL — AB (ref 1.7–7.7)
NEUTROS PCT: 79 %
Platelets: 402 10*3/uL — ABNORMAL HIGH (ref 150–400)
RBC: 4.83 MIL/uL (ref 3.87–5.11)
RDW: 13.7 % (ref 11.5–15.5)
WBC: 15.5 10*3/uL — ABNORMAL HIGH (ref 4.0–10.5)

## 2016-12-10 LAB — URINALYSIS, ROUTINE W REFLEX MICROSCOPIC
BILIRUBIN URINE: NEGATIVE
Bacteria, UA: NONE SEEN
Glucose, UA: NEGATIVE mg/dL
KETONES UR: NEGATIVE mg/dL
Nitrite: NEGATIVE
Protein, ur: NEGATIVE mg/dL
SPECIFIC GRAVITY, URINE: 1.026 (ref 1.005–1.030)
pH: 5 (ref 5.0–8.0)

## 2016-12-10 LAB — C DIFFICILE QUICK SCREEN W PCR REFLEX
C DIFFICILE (CDIFF) TOXIN: NEGATIVE
C DIFFICLE (CDIFF) ANTIGEN: POSITIVE — AB

## 2016-12-10 LAB — LIPASE, BLOOD: LIPASE: 21 U/L (ref 11–51)

## 2016-12-10 LAB — PREGNANCY, URINE: Preg Test, Ur: NEGATIVE

## 2016-12-10 LAB — CLOSTRIDIUM DIFFICILE BY PCR: CDIFFPCR: POSITIVE — AB

## 2016-12-10 MED ORDER — ONDANSETRON HCL 4 MG PO TABS: 4.0000 mg | ORAL_TABLET | Freq: Four times a day (QID) | ORAL | 0 refills | Status: DC | PRN

## 2016-12-10 MED ORDER — METOCLOPRAMIDE HCL 5 MG/ML IJ SOLN
10.0000 mg | Freq: Once | INTRAMUSCULAR | Status: AC
  Administered 2016-12-10: 10 mg via INTRAVENOUS
  Filled 2016-12-10: qty 2

## 2016-12-10 MED ORDER — SODIUM CHLORIDE 0.9 % IV BOLUS (SEPSIS)
1000.0000 mL | Freq: Once | INTRAVENOUS | Status: AC
  Administered 2016-12-10: 1000 mL via INTRAVENOUS

## 2016-12-10 MED ORDER — PANTOPRAZOLE SODIUM 40 MG IV SOLR
40.0000 mg | Freq: Once | INTRAVENOUS | Status: AC
  Administered 2016-12-10: 40 mg via INTRAVENOUS
  Filled 2016-12-10: qty 40

## 2016-12-10 NOTE — ED Provider Notes (Signed)
[facility] EMERGENCY DEPARTMENT Provider Note   CSN: [id number] Arrival date & time: 12/10/16  8144     History   Chief Complaint No chief complaint on file.   HPI Tracey Perkins is a 31 y.o. female.  The history is provided by the patient.   31 year old female who presents with nausea, vomiting, diarrhea.  She has a history of type 1 diabetes with insulin pump and chronic lymphocytic thyroiditis.  Reports onset of one episode of nonbloody nonbilious emesis this morning followed by 2-3 episodes of nonbloody diarrhea.  She states that this is a recurrent issue with her, related to potentially her type 1 diabetes versus gastroparesis.  She recently had a URI, but symptoms improved after antibiotics a week ago.  Denies any fevers, cough, dyspnea, dysuria or urinary frequency.  Endorses epigastric abdominal burning.  Try to take antacid at home, but symptoms did not improve. Past Medical History:  Diagnosis Date  . Brain tumor ([place])   . Diabetes mellitus without complication ([place])   . Eczema   . Gastroparesis   . Hypokalemia   . Hypomagnesemia   . Hypothyroid   . Neuropathy   . Thyroid disease   . Vision changes     Patient Active Problem List   Diagnosis Date Noted  . Mild persistent asthma without complication 81/85/6314  . Acute seasonal allergic rhinitis due to pollen 11/16/2015  . Anaphylactic shock due to adverse food reaction 11/16/2015  . 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 Type 1 diabetes mellitus without complication ([place]) 97/02/6376  . Dry mouth 11/16/2015  . Xerosis cutis 11/16/2015    Past Surgical History:  Procedure Laterality Date  . BRAIN SURGERY    . Portacath placement and removed    . WISDOM TOOTH EXTRACTION      OB History    No data available       Home Medications    Prior to Admission medications   Medication Sig Start Date End Date Taking? Authorizing Provider  albuterol (PROAIR HFA) 108 (90 Base) MCG/ACT inhaler  Inhale 2 puffs into the lungs every 4 (four) hours as needed for wheezing or shortness of breath. 11/16/15  Yes Bardelas, Jose A, MD  fluticasone (FLONASE) 50 MCG/ACT nasal spray Place 1 spray into both nostrils daily.  08/04/15  Yes [provider]  hydrocortisone 2.5 % cream Apply 1 application topically daily as needed for itching. 09/22/16  Yes [provider]  Insulin Human (INSULIN PUMP) SOLN Inject into the skin. novolog   Yes [provider]  ketoconazole (NIZORAL) 2 % shampoo Apply 1 application topically 2 (two) times a week. 09/22/16  Yes [provider]  levothyroxine (SYNTHROID, LEVOTHROID) 88 MCG tablet Take 88 mcg by mouth daily before breakfast.   Yes [provider]  loratadine (CLARITIN) 10 MG tablet Take 1 tablet (10 mg total) daily by mouth. 11/28/16  Yes Bardelas, Jose A, MD  lubiprostone (AMITIZA) 8 MCG capsule Take 8 mcg by mouth 2 (two) times daily with a meal.   Yes [provider]  magnesium oxide (MAG-OX) 400 MG tablet Take 800 mg by mouth at bedtime.   Yes [provider]  mometasone (ELOCON) 0.1 % cream Apply 1 application topically daily as needed for itching. 09/02/16  Yes [provider]  montelukast (SINGULAIR) 10 MG tablet Take 1 tablet (10 mg total) at bedtime by mouth. 11/28/16  Yes Bardelas, Jose A, MD  Multiple Vitamin (MULTIVITAMIN) capsule Take 1 capsule by mouth  daily.   Yes [provider]  omeprazole (PRILOSEC) 20 MG capsule Take 20 mg by mouth daily as needed for heartburn. 09/28/16  Yes [provider]  potassium chloride (K-DUR,KLOR-CON) 10 MEQ tablet Take 10 mEq by mouth daily.   Yes [provider]  Probiotic Product (VSL#3 DS) PACK Take 1 each by mouth 2 (two) times daily as needed (supplement).    Yes [provider]  [name] 150-35 MCG/24HR transdermal patch Apply 1 packet topically every Monday.  05/28/16  Yes [provider]  acetaminophen  (TYLENOL) 500 MG tablet Take 500 mg by mouth as needed for pain.    [provider]  glucagon (GLUCAGON EMERGENCY) 1 MG injection Inject 1 mg into the skin as needed. 06/30/16   [provider]  ondansetron (ZOFRAN ODT) 4 MG disintegrating tablet Take 1 tablet (4 mg total) by mouth every 8 (eight) hours as needed for nausea. 06/30/16   Tanna Furry, MD  ondansetron (ZOFRAN) 4 MG tablet Take 1 tablet (4 mg total) by mouth every 6 (six) hours as needed for nausea or vomiting. 12/10/16   Forde Dandy, MD    Family History Family History  Problem Relation Age of Onset  . Thyroid disease Mother   . Eczema Sister   . Thyroid disease Sister   . Thyroid disease Sister     Social History Social History   Tobacco Use  . Smoking status: Never Smoker  . Smokeless tobacco: Never Used  Substance Use Topics  . Alcohol use: No  . Drug use: No     Allergies   Banana; Doxycycline; Gentian violet-proflavine sulfate [triple dye]; Morphine and related; and Vancomycin   Review of Systems Review of Systems  Constitutional: Negative for fever.  Respiratory: Negative for cough and shortness of breath.   Gastrointestinal: Positive for abdominal pain, diarrhea, nausea and vomiting.  All other systems reviewed and are negative.    Physical Exam Updated Vital Signs BP 102/80   Pulse 88   Temp 97.9 F (36.6 C) (Oral)   Resp 20   SpO2 90%   Physical Exam Physical Exam  Nursing note and vitals reviewed. Constitutional: Chronically ill appearing, well nourished, non-toxic, and in no acute distress Head: Normocephalic and atraumatic.  Mouth/Throat: Oropharynx is clear and dry mucous membranes.  Neck: Normal range of motion. Neck supple.  Cardiovascular: Tachycardic rate and regular rhythm.   Pulmonary/Chest: Effort normal and breath sounds normal.  Abdominal: Soft. There is mild epigastric tenderness. There is no rebound and no guarding.  Musculoskeletal: Normal range of  motion.  Neurological: Alert, no facial droop, fluent speech, moves all extremities symmetrically Skin: Skin is warm and dry.  Psychiatric: Cooperative   ED Treatments / Results  Labs (all labs ordered are listed, but only abnormal results are displayed) Labs Reviewed  CBC WITH DIFFERENTIAL/PLATELET - Abnormal; Notable for the following components:      Result Value   WBC 15.5 (*)    Hemoglobin 15.6 (*)    Platelets 402 (*)    Neutro Abs 12.3 (*)    Monocytes Absolute 1.2 (*)    All other components within normal limits  COMPREHENSIVE METABOLIC PANEL - Abnormal; Notable for the following components:   Chloride 100 (*)    Glucose, Bld 139 (*)    Creatinine, Ser 1.01 (*)    Total Protein 8.5 (*)    All other components within normal limits  URINALYSIS, ROUTINE W REFLEX MICROSCOPIC - Abnormal; Notable for the following  components:   APPearance HAZY (*)    Hgb urine dipstick LARGE (*)    Leukocytes, UA TRACE (*)    Squamous Epithelial / LPF 0-5 (*)    All other components within normal limits  C DIFFICILE QUICK SCREEN W PCR REFLEX  PREGNANCY, URINE  LIPASE, BLOOD    EKG  EKG Interpretation None       Radiology No results found.  Procedures Procedures (including critical care time)  Medications Ordered in ED Medications  sodium chloride 0.9 % bolus 1,000 mL (1,000 mLs Intravenous New Bag/Given 12/10/16 1226)  metoCLOPramide (REGLAN) injection 10 mg (10 mg Intravenous Given 12/10/16 1108)  pantoprazole (PROTONIX) injection 40 mg (40 mg Intravenous Given 12/10/16 1102)  sodium chloride 0.9 % bolus 1,000 mL (0 mLs Intravenous Stopped 12/10/16 1301)     Initial Impression / Assessment and Plan / ED Course  I have reviewed the triage vital signs and the nursing notes.  Pertinent labs & imaging results that were available during my care of the patient were reviewed by me and considered in my medical decision making (see chart for details).     Presents with  recurrent nausea, vomiting, diarrhea.  Symptoms similar to prior symptoms of gastroparesis.  No signs of DKA today on blood work.  Mild dehydration and leukocytosis, likely reactive.  Given IV fluids, antiemetics, and Protonix.  She feels symptomatically improved.  She is tolerating p.o. intake without difficulty and she feels ready for discharge home. Strict return and follow-up instructions reviewed. She and family expressed understanding of all discharge instructions and felt comfortable with the plan of care.   Final Clinical Impressions(s) / ED Diagnoses   Final diagnoses:  Nausea vomiting and diarrhea    ED Discharge Orders        Ordered    ondansetron (ZOFRAN) 4 MG tablet  Every 6 hours PRN     12/10/16 1328       Forde Dandy, MD 12/10/16 1330

## 2016-12-10 NOTE — Discharge Instructions (Signed)
Take nausea medications aas prescribed. Return without fail for worsening symptoms, including intractable vomiting, severe pain, fever, confusion, or any other symptoms concerning to you

## 2016-12-10 NOTE — ED Notes (Signed)
Pt's mother turned pt's insulin pump down 85%.

## 2016-12-10 NOTE — ED Triage Notes (Signed)
Patient awoke this am with abdominal pain, vomiting and diarrhea. States that she feels dry and thinks she needs fluids, HR 120s. Alert and oriented

## 2017-01-03 ENCOUNTER — Other Ambulatory Visit: Payer: Self-pay | Admitting: Allergy

## 2017-01-03 ENCOUNTER — Other Ambulatory Visit: Payer: Self-pay | Admitting: Pediatrics

## 2017-01-03 MED ORDER — MONTELUKAST SODIUM 10 MG PO TABS: 10.0000 mg | ORAL_TABLET | Freq: Every day | ORAL | 0 refills | Status: DC

## 2017-01-03 NOTE — Telephone Encounter (Signed)
RF on Singulair denied, pt needs an OV

## 2017-01-28 ENCOUNTER — Other Ambulatory Visit: Payer: Self-pay

## 2017-01-28 ENCOUNTER — Emergency Department (HOSPITAL_COMMUNITY)
Admission: EM | Admit: 2017-01-28 | Discharge: 2017-01-28 | Disposition: A | Discharge status: Home / Self Care | Payer: Medicaid Other | Attending: Emergency Medicine | Admitting: Emergency Medicine

## 2017-01-28 DIAGNOSIS — Z79899 Other long term (current) drug therapy: Secondary | ICD-10-CM | POA: Insufficient documentation

## 2017-01-28 DIAGNOSIS — R112 Nausea with vomiting, unspecified: Secondary | ICD-10-CM | POA: Diagnosis not present

## 2017-01-28 DIAGNOSIS — Z794 Long term (current) use of insulin: Secondary | ICD-10-CM | POA: Insufficient documentation

## 2017-01-28 DIAGNOSIS — R1013 Epigastric pain: Secondary | ICD-10-CM | POA: Insufficient documentation

## 2017-01-28 DIAGNOSIS — J45909 Unspecified asthma, uncomplicated: Secondary | ICD-10-CM | POA: Insufficient documentation

## 2017-01-28 DIAGNOSIS — R739 Hyperglycemia, unspecified: Secondary | ICD-10-CM

## 2017-01-28 DIAGNOSIS — E039 Hypothyroidism, unspecified: Secondary | ICD-10-CM | POA: Diagnosis not present

## 2017-01-28 DIAGNOSIS — E1065 Type 1 diabetes mellitus with hyperglycemia: Secondary | ICD-10-CM | POA: Diagnosis not present

## 2017-01-28 LAB — I-STAT BETA HCG BLOOD, ED (MC, WL, AP ONLY): I-stat hCG, quantitative: <5 m[IU]/mL (ref <5)

## 2017-01-28 LAB — COMPREHENSIVE METABOLIC PANEL
ALT: 19 U/L (ref 14–54)
AST: 18 U/L (ref 15–41)
Albumin: 3.6 g/dL (ref 3.5–5.0)
Alkaline Phosphatase: 81 U/L (ref 38–126)
Anion gap: 11 (ref 5–15)
BUN: 13 mg/dL (ref 6–20)
CO2: 23 mmol/L (ref 22–32)
Calcium: 8.9 mg/dL (ref 8.9–10.3)
Chloride: 101 mmol/L (ref 101–111)
Creatinine, Ser: 0.89 mg/dL (ref 0.44–1.00)
GFR calc Af Amer: >60 mL/min (ref >60)
GFR calc non Af Amer: >60 mL/min (ref >60)
Glucose, Bld: 188 mg/dL — ABNORMAL HIGH (ref 65–99)
Potassium: 3.4 mmol/L — ABNORMAL LOW (ref 3.5–5.1)
Sodium: 135 mmol/L (ref 135–145)
Total Bilirubin: 0.4 mg/dL (ref 0.3–1.2)
Total Protein: 7.1 g/dL (ref 6.5–8.1)

## 2017-01-28 LAB — CBC
HCT: 39.8 % (ref 36.0–46.0)
Hemoglobin: 13.9 g/dL (ref 12.0–15.0)
MCH: 32 pg (ref 26.0–34.0)
MCHC: 34.9 g/dL (ref 30.0–36.0)
MCV: 91.5 fL (ref 78.0–100.0)
Platelets: 332 10*3/uL (ref 150–400)
RBC: 4.35 MIL/uL (ref 3.87–5.11)
RDW: 12.9 % (ref 11.5–15.5)
WBC: 10.6 10*3/uL — ABNORMAL HIGH (ref 4.0–10.5)

## 2017-01-28 LAB — URINALYSIS, ROUTINE W REFLEX MICROSCOPIC
Bilirubin Urine: NEGATIVE
Glucose, UA: 50 mg/dL — AB
Ketones, ur: NEGATIVE mg/dL
Leukocytes, UA: NEGATIVE
Nitrite: NEGATIVE
Protein, ur: NEGATIVE mg/dL
Specific Gravity, Urine: 1.014 (ref 1.005–1.030)
pH: 6 (ref 5.0–8.0)

## 2017-01-28 LAB — LIPASE, BLOOD: Lipase: 27 U/L (ref 11–51)

## 2017-01-28 MED ORDER — SODIUM CHLORIDE 0.9 % IV BOLUS (SEPSIS)
1000.0000 mL | Freq: Once | INTRAVENOUS | Status: AC
  Administered 2017-01-28: 1000 mL via INTRAVENOUS

## 2017-01-28 MED ORDER — FENTANYL CITRATE (PF) 100 MCG/2ML IJ SOLN
50.0000 ug | Freq: Once | INTRAMUSCULAR | Status: DC
  Filled 2017-01-28: qty 2

## 2017-01-28 MED ORDER — HALOPERIDOL LACTATE 5 MG/ML IJ SOLN
2.0000 mg | Freq: Once | INTRAMUSCULAR | Status: AC
  Administered 2017-01-28: 2 mg via INTRAVENOUS
  Filled 2017-01-28: qty 1

## 2017-01-28 NOTE — ED Notes (Signed)
Patient given discharge instructions and verbalized understanding.  Patient stable to discharge at this time.  Patient is alert and oriented to baseline.  No distressed noted at this time.  All belongings taken with the patient at discharge.   

## 2017-01-28 NOTE — ED Triage Notes (Signed)
Pt from home for nausea and abdominal pain. Pt states hx of type 1 diabetes, per mom CBG read 500 yesterday. Last reading this morning was 181. Pt states has hx of gastroparesis and symptoms started last night. Pt denies any vomiting diarrhea.

## 2017-01-28 NOTE — ED Provider Notes (Signed)
[facility] EMERGENCY DEPARTMENT Provider Note   CSN: [id number] Arrival date & time: 01/28/17  1009     History   Chief Complaint Chief Complaint  Patient presents with  . Nausea  . Abdominal Pain    HPI [name] is a 32 y.o. female.  HPI   32 year old female with hyperglycemia.  She is a type I diabetic and has an insulin pump.  Yesterday she noticed that her blood sugar went up into the 500s.  She bolused herself additional insulin overnight and it was in the 200s before she went to bed.  She did change her needle out as well.  This morning her sugar was back into the 280s and she was feeling nauseated with some epigastric pain.  Small amount of urine ketones so she presented to the emergency room.  No fevers or chills.  She was in her usual state of health leading up to this.  Denies any significant change in her diet or recent change in her insulin regimen.  She denies polyuria or polydipsia.  Past Medical History:  Diagnosis Date  . Brain tumor ([place])   . Diabetes mellitus without complication ([place])   . Eczema   . Gastroparesis   . Hypokalemia   . Hypomagnesemia   . Hypothyroid   . Neuropathy   . Thyroid disease   . Vision changes     Patient Active Problem List   Diagnosis Date Noted  . Mild persistent asthma without complication 77/93/9030  . Acute seasonal allergic rhinitis due to pollen 11/16/2015  . Anaphylactic shock due to adverse food reaction 11/16/2015  . 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 Type 1 diabetes mellitus without complication ([place]) 10/09/3005  . Dry mouth 11/16/2015  . Xerosis cutis 11/16/2015    Past Surgical History:  Procedure Laterality Date  . BRAIN SURGERY    . Portacath placement and removed    . WISDOM TOOTH EXTRACTION      OB History    No data available       Home Medications    Prior to Admission medications   Medication Sig Start Date End Date Taking? Authorizing Provider  albuterol (PROAIR  HFA) 108 (90 Base) MCG/ACT inhaler Inhale 2 puffs into the lungs every 4 (four) hours as needed for wheezing or shortness of breath. 11/16/15  Yes Bardelas, Jose A, MD  fluticasone (FLONASE) 50 MCG/ACT nasal spray Place 1 spray into both nostrils daily.  08/04/15  Yes [provider]  Insulin Human (INSULIN PUMP) SOLN Inject into the skin. novolog   Yes [provider]  levothyroxine (SYNTHROID, LEVOTHROID) 88 MCG tablet Take 88 mcg by mouth daily before breakfast.   Yes [provider]  loratadine (CLARITIN) 10 MG tablet Take 1 tablet (10 mg total) daily by mouth. 11/28/16  Yes Bardelas, Jose A, MD  lubiprostone (AMITIZA) 8 MCG capsule Take 8 mcg by mouth 2 (two) times daily with a meal.   Yes [provider]  magnesium oxide (MAG-OX) 400 MG tablet Take 800 mg by mouth at bedtime.   Yes [provider]  montelukast (SINGULAIR) 10 MG tablet Take 1 tablet (10 mg total) by mouth at bedtime. 01/03/17  Yes Bardelas, Jens Som, MD  Multiple Vitamin (MULTIVITAMIN) capsule Take 1 capsule by mouth daily.   Yes [provider]  omeprazole (PRILOSEC) 20 MG capsule Take 20 mg by mouth daily as needed for heartburn. 09/28/16  Yes [provider]  ondansetron (ZOFRAN ODT) 4 MG  disintegrating tablet Take 1 tablet (4 mg total) by mouth every 8 (eight) hours as needed for nausea. 06/30/16  Yes Tanna Furry, MD  potassium chloride (K-DUR,KLOR-CON) 10 MEQ tablet Take 10 mEq by mouth daily.   Yes [provider]  Probiotic Product (VSL#3 DS) PACK Take 1 each by mouth 2 (two) times daily as needed (supplement).    Yes [provider]  [name] 150-35 MCG/24HR transdermal patch Apply 1 packet topically every Monday.  05/28/16  Yes [provider]  acetaminophen (TYLENOL) 500 MG tablet Take 500 mg by mouth as needed for pain.    [provider]  glucagon (GLUCAGON EMERGENCY) 1 MG injection Inject 1 mg into the skin as needed. 06/30/16    [provider]  hydrocortisone 2.5 % cream Apply 1 application topically daily as needed for itching. 09/22/16   [provider]  mometasone (ELOCON) 0.1 % cream Apply 1 application topically daily as needed for itching. 09/02/16   [provider]  ondansetron (ZOFRAN) 4 MG tablet Take 1 tablet (4 mg total) by mouth every 6 (six) hours as needed for nausea or vomiting. Patient not taking: Reported on 01/28/2017 12/10/16   Forde Dandy, MD    Family History Family History  Problem Relation Age of Onset  . Thyroid disease Mother   . Eczema Sister   . Thyroid disease Sister   . Thyroid disease Sister     Social History Social History   Tobacco Use  . Smoking status: Never Smoker  . Smokeless tobacco: Never Used  Substance Use Topics  . Alcohol use: No  . Drug use: No     Allergies   Banana; Doxycycline; Gentian violet-proflavine sulfate [triple dye]; Mold extract [trichophyton]; Morphine and related; and Vancomycin   Review of Systems Review of Systems  All systems reviewed and negative, other than as noted in HPI.  Physical Exam Updated Vital Signs Ht 5' 2.5" (1.588 m)   Wt 77.1 kg (170 lb)   LMP 01/16/2017 (Within Weeks)   SpO2 99%   BMI 30.60 kg/m   Physical Exam  Constitutional: She appears well-developed and well-nourished. No distress.  Laying in bed.  No acute distress.  HENT:  Head: Normocephalic and atraumatic.  Eyes: Conjunctivae are normal. Right eye exhibits no discharge. Left eye exhibits no discharge.  Neck: Neck supple.  Cardiovascular: Normal rate, regular rhythm and normal heart sounds. Exam reveals no gallop and no friction rub.  No murmur heard. Pulmonary/Chest: Effort normal and breath sounds normal. No respiratory distress.  Abdominal: Soft. She exhibits no distension. There is tenderness.  Mild epigastric tenderness without rebound or guarding.  Musculoskeletal: She exhibits no edema or tenderness.  Neurological:  She is alert.  Skin: Skin is warm and dry.  Psychiatric: She has a normal mood and affect. Her behavior is normal. Thought content normal.  Nursing note and vitals reviewed.    ED Treatments / Results  Labs (all labs ordered are listed, but only abnormal results are displayed) Labs Reviewed  COMPREHENSIVE METABOLIC PANEL - Abnormal; Notable for the following components:      Result Value   Potassium 3.4 (*)    Glucose, Bld 188 (*)    All other components within normal limits  CBC - Abnormal; Notable for the following components:   WBC 10.6 (*)    All other components within normal limits  URINALYSIS, ROUTINE W REFLEX MICROSCOPIC - Abnormal; Notable for the following components:   APPearance HAZY (*)  Glucose, UA 50 (*)    Hgb urine dipstick MODERATE (*)    Bacteria, UA RARE (*)    Squamous Epithelial / LPF 6-30 (*)    All other components within normal limits  LIPASE, BLOOD  I-STAT BETA HCG BLOOD, ED ([facility], [facility], [facility] ONLY)    EKG  EKG Interpretation None       Radiology No results found.  Procedures Procedures (including critical care time)  Medications Ordered in ED Medications  sodium chloride 0.9 % bolus 1,000 mL (1,000 mLs Intravenous New Bag/Given 01/28/17 1125)     Initial Impression / Assessment and Plan / ED Course  I have reviewed the triage vital signs and the nursing notes.  Pertinent labs & imaging results that were available during my care of the patient were reviewed by me and considered in my medical decision making (see chart for details).     33 year old female with hyperglycemia and some ketones in her urine at home.  Now improved.  She is feeling significantly better.  Final Clinical Impressions(s) / ED Diagnoses   Final diagnoses:  Hyperglycemia  Nausea and vomiting, intractability of vomiting not specified, unspecified vomiting type  Epigastric pain    ED Discharge Orders    None       Virgel Manifold, MD 01/28/17 1531

## 2017-01-30 ENCOUNTER — Ambulatory Visit: Payer: Self-pay | Admitting: Pediatrics

## 2017-02-09 ENCOUNTER — Ambulatory Visit (INDEPENDENT_AMBULATORY_CARE_PROVIDER_SITE_OTHER): Payer: Medicaid Other | Admitting: Family Medicine

## 2017-02-09 ENCOUNTER — Encounter: Payer: Self-pay | Admitting: Family Medicine

## 2017-02-09 VITALS — BP 104/76 | HR 88 | Temp 98.2°F | Resp 16 | Ht 63.39 in | Wt 171.1 lb

## 2017-02-09 DIAGNOSIS — J4530 Mild persistent asthma, uncomplicated: Secondary | ICD-10-CM

## 2017-02-09 DIAGNOSIS — E109 Type 1 diabetes mellitus without complications: Secondary | ICD-10-CM | POA: Diagnosis not present

## 2017-02-09 DIAGNOSIS — C716 Malignant neoplasm of cerebellum: Secondary | ICD-10-CM

## 2017-02-09 DIAGNOSIS — K219 Gastro-esophageal reflux disease without esophagitis: Secondary | ICD-10-CM

## 2017-02-09 DIAGNOSIS — L853 Xerosis cutis: Secondary | ICD-10-CM | POA: Diagnosis not present

## 2017-02-09 DIAGNOSIS — T781XXA Other adverse food reactions, not elsewhere classified, initial encounter: Secondary | ICD-10-CM | POA: Insufficient documentation

## 2017-02-09 DIAGNOSIS — T781XXS Other adverse food reactions, not elsewhere classified, sequela: Secondary | ICD-10-CM

## 2017-02-09 MED ORDER — AZELASTINE HCL 0.1 % NA SOLN: 2.0000 | Freq: Two times a day (BID) | NASAL | 5 refills | Status: DC

## 2017-02-09 MED ORDER — ALBUTEROL SULFATE HFA 108 (90 BASE) MCG/ACT IN AERS: 2.0000 | INHALATION_SPRAY | RESPIRATORY_TRACT | 1 refills | Status: DC | PRN

## 2017-02-09 MED ORDER — OMEPRAZOLE 20 MG PO CPDR: 20.0000 mg | DELAYED_RELEASE_CAPSULE | Freq: Every day | ORAL | 5 refills | Status: DC | PRN

## 2017-02-09 MED ORDER — CETIRIZINE HCL 10 MG PO TABS: 10.0000 mg | ORAL_TABLET | Freq: Every day | ORAL | 5 refills | Status: DC

## 2017-02-09 MED ORDER — MONTELUKAST SODIUM 10 MG PO TABS: 10.0000 mg | ORAL_TABLET | Freq: Every day | ORAL | 5 refills | Status: DC

## 2017-02-09 MED ORDER — FLUTICASONE PROPIONATE 50 MCG/ACT NA SUSP: 1.0000 | Freq: Every day | NASAL | 5 refills | Status: DC

## 2017-02-09 NOTE — Progress Notes (Signed)
[facility] [address] Dept: [phone]  FOLLOW UP NOTE  Patient ID: Tracey Perkins, female    DOB: 02-26-1985  Age: 32 y.o. MRN: [id number] Date of Office Visit: 02/09/2017  Assessment  Chief Complaint: Asthma  HPI Tracey Perkins is a 32 year old female who presents to the clinic for a follow up visit. She was last in the clinic on 11/16/2015 by Dr. Shaune Leeks for evaluation of mild persistent asthma, possible food allergies, and allergic rhinitis. At that time, she was found to be positive on intradermal skin testing to tree mix, mold 4, and dust mite. IgE to banana was slightly positive.   At today's visit, she reports she has been doing fairly well. She reports her asthma and allergies as mostly well controlled. She reports that she experiences symptoms including occasional shortness of breath with chest tightness at rest and with activity, sneezing, headache, itchy watery eyes, and nasal congestion as well as clear runny nose. She has one episode about 1 week ago when she was in [place], [name] and she reports the [place] allergens were high that day, when she experienced a chest tightening and shortness of breath with a feeling of needles pricking her entire body. She used her ProAir which helped a little and the feeling passed within an hour. She denies wheezing and cough.   Allergic rhinitis is reported as well controlled.  She has some occasional nasal congestion combined with clear runny nose for which she takes Claritin daily, uses Flonase nasal spray daily, and uses over-the-counter eyedrops as needed.  Reflux is reported as well controlled.  Reflux is aggravated by spicy foods and some seasonings including ginger.  She takes omeprazole as needed and not daily.  Her current medications are listed in the chart.  Drug Allergies:  Allergies  Allergen Reactions  . Banana Other (See Comments)    Mouth burning and swollen  . Doxycycline Hives  . Gentian  Violet-Proflavine Sulfate [Triple Dye] Other (See Comments)    Mouth burn  . Mold Extract [Trichophyton] Other (See Comments)    Hay Fever  . Morphine And Related Hives  . Vancomycin Other (See Comments)    Red man syndrome     Physical Exam: BP 104/76 (BP Location: Right Arm, Patient Position: Sitting, Cuff Size: Normal)   Pulse 88   Temp 98.2 F (36.8 C) (Oral)   Resp 16   Ht 5' 3.39" (1.61 m)   Wt 171 lb 1.2 oz (77.6 kg)   LMP 01/16/2017 (Within Weeks)   SpO2 97%   BMI 29.94 kg/m    Physical Exam  Constitutional: She is oriented to person, place, and time. She appears well-developed and well-nourished.  HENT:  Right Ear: External ear normal.  Left Ear: External ear normal.  Nose: Nose normal.  Mouth/Throat: Oropharynx is clear and moist.  Eyes normal.  Ears normal.  Pharynx normal.  Nares normal.  No drainage noted on exam  Eyes: Conjunctivae are normal.  Neck: Normal range of motion. Neck supple.  Cardiovascular: Normal rate, regular rhythm and normal heart sounds.  S1-S2 normal.  Regular heart rate and rhythm.  No murmur noted.  Pulmonary/Chest: Effort normal and breath sounds normal.  Lungs clear to auscultation  Musculoskeletal: Normal range of motion.  Neurological: She is alert and oriented to person, place, and time.  Skin: Skin is warm and dry.  Psychiatric: She has a normal mood and affect. Her behavior is normal. Judgment and thought content normal.  Diagnostics: FVC 2.35, FEV1 2.07.  Predicted FVC 3.65 predicted FEV1 3.09.  Spirometry indicates moderate restriction.  Postbronchodilator therapy FVC 1.92, FEV1 1.88.  Post bronchodilator therapy indicates no significant bronchodilator response  Assessment and Plan: 1. Mild persistent asthma without complication   2. Type 1 diabetes mellitus without complication ([facility])   3. Xerosis cutis   4. 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 Adverse food reaction, sequela   6. Gastroesophageal reflux disease, esophagitis  presence not specified     Meds ordered this encounter  Medications  . montelukast (SINGULAIR) 10 MG tablet    Sig: Take 1 tablet (10 mg total) by mouth at bedtime. To prevent coughing or wheezing    Dispense:  34 tablet    Refill:  5    Patient needs ov.Patient made appointment.  Marland Kitchen albuterol (PROAIR HFA) 108 (90 Base) MCG/ACT inhaler    Sig: Inhale 2 puffs into the lungs every 4 (four) hours as needed. For coughing or wheezing spells    Dispense:  1 Inhaler    Refill:  1  . omeprazole (PRILOSEC) 20 MG capsule    Sig: Take 1 capsule (20 mg total) by mouth daily as needed. For reflux    Dispense:  34 capsule    Refill:  5  . azelastine (ASTELIN) 0.1 % nasal spray    Sig: Place 2 sprays into both nostrils 2 (two) times daily. As needed for nasal symptoms    Dispense:  30 mL    Refill:  5  . cetirizine (ZYRTEC) 10 MG tablet    Sig: Take 1 tablet (10 mg total) by mouth daily.    Dispense:  1 tablet    Refill:  5  . fluticasone (FLONASE) 50 MCG/ACT nasal spray    Sig: Place 1 spray into both nostrils daily.    Dispense:  16 g    Refill:  5    Patient Instructions  Environmental control of pollens dust mite and mold information given today Zyrtec 10 mg once a day if needed for runny nose or itchy eyes.  Fluticasone 1 spray per nostril twice a day if needed for stuffy nose Montelukast  10 mg once a day for coughing or wheezing ProAirir 2 puffs every 4 hours if needed for wheezing or coughing spells Call me if you're not doing better on this treatment plan Astelin nasal spray 2 sprays in each nostril twice a day as needed for nasal symptoms Begin omeprazole 20 mg once a day to control GERD Consider saline nasal spray prior to medicated nasal sprays [place] eye drop sample given in the office. Call me if you need a prescription for this.  Continue other medications as noted in your chart.  Call me if this treatment plan is not working well for you  Follow up in 6 months or sooner  as needed.      Return in about 6 months (around 08/09/2017), or if symptoms worsen or fail to improve.    Thank you for the opportunity to care for this patient.  Please do not hesitate to contact me with questions.  Gareth Morgan, FNP [facility]   I have provided oversight concerning [facility]'s evaluation and treatment of this patient's health issues addressed during today's encounter.  I agree with the assessment and therapeutic plan as outlined in the note.   Signed,   R Edgar Frisk, MD

## 2017-02-09 NOTE — Patient Instructions (Addendum)
Environmental control of pollens dust mite and mold information given today Zyrtec 10 mg once a day if needed for runny nose or itchy eyes.  Fluticasone 1 spray per nostril twice a day if needed for stuffy nose Montelukast  10 mg once a day for coughing or wheezing ProAirir 2 puffs every 4 hours if needed for wheezing or coughing spells Call me if you're not doing better on this treatment plan Astelin nasal spray 2 sprays in each nostril twice a day as needed for nasal symptoms Begin omeprazole 20 mg once a day to control GERD Consider saline nasal spray prior to medicated nasal sprays [place] eye drop sample given in the office. Call me if you need a prescription for this.  Continue other medications as noted in your chart.  Call me if this treatment plan is not working well for you  Follow up in 6 months or sooner as needed.

## 2017-02-16 ENCOUNTER — Other Ambulatory Visit: Payer: Self-pay

## 2017-02-16 MED ORDER — CETIRIZINE HCL 10 MG PO TABS: 10.0000 mg | ORAL_TABLET | Freq: Every day | ORAL | 5 refills | Status: DC

## 2017-02-16 NOTE — Telephone Encounter (Signed)
RF for cetirizine 10 mg x 5 at [facility]

## 2017-08-08 ENCOUNTER — Other Ambulatory Visit: Payer: Self-pay

## 2017-08-08 ENCOUNTER — Encounter (HOSPITAL_COMMUNITY): Payer: Self-pay | Admitting: Emergency Medicine

## 2017-08-08 ENCOUNTER — Ambulatory Visit (HOSPITAL_COMMUNITY)
Admission: EM | Admit: 2017-08-08 | Discharge: 2017-08-08 | Disposition: A | Discharge status: Home / Self Care | Payer: Medicaid Other | Attending: Family Medicine | Admitting: Family Medicine

## 2017-08-08 DIAGNOSIS — L298 Other pruritus: Secondary | ICD-10-CM | POA: Diagnosis not present

## 2017-08-08 DIAGNOSIS — R21 Rash and other nonspecific skin eruption: Secondary | ICD-10-CM

## 2017-08-08 DIAGNOSIS — T7840XA Allergy, unspecified, initial encounter: Secondary | ICD-10-CM

## 2017-08-08 MED ORDER — METHYLPREDNISOLONE SODIUM SUCC 125 MG IJ SOLR
80.0000 mg | Freq: Once | INTRAMUSCULAR | Status: AC
  Administered 2017-08-08: 80 mg via INTRAMUSCULAR

## 2017-08-08 MED ORDER — METHYLPREDNISOLONE ACETATE 80 MG/ML IJ SUSP
INTRAMUSCULAR | Status: AC
  Filled 2017-08-08: qty 1

## 2017-08-08 MED ORDER — METHYLPREDNISOLONE SODIUM SUCC 125 MG IJ SOLR
INTRAMUSCULAR | Status: AC
  Filled 2017-08-08: qty 2

## 2017-08-08 NOTE — ED Notes (Signed)
Medication given was Methylprednisolone acetate 80 mg, it was scanned in wrong but has been corrected and given to pt

## 2017-08-08 NOTE — ED Triage Notes (Addendum)
The patient presented to the [facility] with a complaint of a rash on her upper body x 1 day. The patient reported just finishing a course of Bactrim DS.

## 2017-08-08 NOTE — Discharge Instructions (Addendum)
It was nice meeting you!!  I am treating you for an allergic reaction with a steroid injection.  Be aware this could increase your blood sugars for the next day or 2 I hope this helps.  It may take a few days for the Bactrim to fully get out of your system.  Quit  using the soap because it is possible you could be having a reaction to that also. Continue the triamcinolone cream since this seems to help with your symptoms and Benadryl or Zyrtec as needed.  Monitor for worsening symptoms to include trouble swallowing, shortness of breath.  Follow-up as needed.

## 2017-08-08 NOTE — ED Notes (Signed)
Pt discharged by provider.

## 2017-08-08 NOTE — ED Provider Notes (Signed)
[place]    CSN: [id number] Arrival date & time: 08/08/17  1658     History   Chief Complaint Chief Complaint  Patient presents with  . Rash    HPI Tracey Perkins is a 32 y.o. female.   Pt is a 32 year old female with past medical history of diabetes type 1, brain tumor, eczema, food allergies, asthma, seasonal allergies, anaphylaxis. She present with allergic reaction with pruritis. This problem worsened this am. She has been using benadryl cream, triamcinolone cream and Aveeno baths with some relief. The problem has decreased since. She reports that her blood sugar was higher than normal this am.   The patient was treated with keflex on 2/63/33 for folliculitis. This was after a hike in the woods 5 days prior. The differentials in this case were folliculitis vs contact dermatitis vs insect bites. She denies any other associated symptoms at that time.   She was then seen again after this at another urgent care  and given bactrim and triamcinolone cream.   Third visit  on 08/04/17 by dermatology with the same issue and was told to continue the triamcinolone cream since it was helping and keep taking the bactrim.   She stopped taking the bactrim this past Sunday do to GI upset and was continuing the triamcinolone cream and the Aveeno baths. She admits to using another soap her grandmother gave her yesterday. Unknown of substance in the soap. She is here today for main concern of hives, and flushing to face that started this am which she used the benadryl cream for and it helped.   She denies any fever, body aches, fatigue, joint aches, sores in the mouth, trouble swallowing or breathing.   ROS per HPI      Past Medical History:  Diagnosis Date  . Brain tumor ([place])   . Diabetes mellitus without complication ([place])   . Eczema   . Food allergy   . Gastroparesis   . Hypokalemia   . Hypomagnesemia   . Hypothyroid   . Neuropathy   . Thyroid disease   . Vision  changes     Patient Active Problem List   Diagnosis Date Noted  . Adverse food reaction 02/09/2017  . Gastroesophageal reflux disease 02/09/2017  . Mild persistent asthma without complication 54/56/2563  . Acute seasonal allergic rhinitis due to pollen 11/16/2015  . Anaphylactic shock due to adverse food reaction 11/16/2015  . 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 Type 1 diabetes mellitus without complication ([place]) 89/37/3428  . Dry mouth 11/16/2015  . Xerosis cutis 11/16/2015    Past Surgical History:  Procedure Laterality Date  . BRAIN SURGERY    . Portacath placement and removed    . WISDOM TOOTH EXTRACTION      OB History   None      Home Medications    Prior to Admission medications   Medication Sig Start Date End Date Taking? Authorizing Provider  sulfamethoxazole-trimethoprim (BACTRIM DS,SEPTRA DS) 800-160 MG tablet Take 1 tablet by mouth 2 (two) times daily.   Yes [provider]  acetaminophen (TYLENOL) 500 MG tablet Take 500 mg by mouth as needed for pain.    [provider]  albuterol (PROAIR HFA) 108 (90 Base) MCG/ACT inhaler Inhale 2 puffs into the lungs every 4 (four) hours as needed. For coughing or wheezing spells 02/09/17   Ambs, Kathrine Cords, FNP  azelastine (ASTELIN) 0.1 % nasal spray Place 2 sprays into both nostrils 2 (two) times  daily. As needed for nasal symptoms 02/09/17   Ambs, Kathrine Cords, FNP  cetirizine (ZYRTEC) 10 MG tablet Take 1 tablet (10 mg total) by mouth daily. 02/16/17   Dara Hoyer, FNP  Continuous Blood Gluc Sensor (FREESTYLE LIBRE SENSOR SYSTEM) MISC USE 1 DEVICE EVERY 10 (TEN) DAYS. 01/29/17   [provider]  fluticasone (FLONASE) 50 MCG/ACT nasal spray Place 1 spray into both nostrils daily. 02/09/17   Dara Hoyer, FNP  gabapentin (NEURONTIN) 300 MG capsule TAKE 1 CAPSULE BY MOUTH EVERY DAY AT NIGHT 01/25/17   [provider]  glucagon (GLUCAGON EMERGENCY) 1 MG injection Inject 1 mg into the skin as needed. 06/30/16    [provider]  hydrocortisone 2.5 % cream Apply 1 application topically daily as needed for itching. 09/22/16   [provider]  Insulin Human (INSULIN PUMP) SOLN Inject into the skin. novolog    [provider]  KLOR-CON 10 10 MEQ tablet Take 10 mEq by mouth daily. 01/28/17   [provider]  levothyroxine (SYNTHROID, LEVOTHROID) 88 MCG tablet Take 88 mcg by mouth daily before breakfast.    [provider]  loratadine (CLARITIN) 10 MG tablet Take 1 tablet (10 mg total) daily by mouth. 11/28/16   Bardelas, Jens Som, MD  lubiprostone (AMITIZA) 8 MCG capsule Take 8 mcg by mouth 2 (two) times daily with a meal.    [provider]  magnesium oxide (MAG-OX) 400 MG tablet Take 800 mg by mouth at bedtime.    [provider]  mometasone (ELOCON) 0.1 % cream Apply 1 application topically daily as needed for itching. 09/02/16   [provider]  montelukast (SINGULAIR) 10 MG tablet Take 1 tablet (10 mg total) by mouth at bedtime. To prevent coughing or wheezing 02/09/17   Ambs, Kathrine Cords, FNP  NOVOLOG 100 UNIT/ML injection USE UP TO 50 UNITS DAILY, AS DIRECTED IN INSULIN PUMP. 01/28/17   [provider]  omeprazole (PRILOSEC) 20 MG capsule Take 1 capsule (20 mg total) by mouth daily as needed. For reflux 02/09/17   Ambs, Kathrine Cords, FNP  ondansetron (ZOFRAN ODT) 4 MG disintegrating tablet Take 1 tablet (4 mg total) by mouth every 8 (eight) hours as needed for nausea. 06/30/16   Tanna Furry, MD  Probiotic Product (VSL#3 DS) PACK Take 1 each by mouth 2 (two) times daily as needed (supplement).     [provider]  [name] 536-64 MCG/24HR transdermal patch Apply 1 packet topically every Monday.  05/28/16   [provider]    Family History Family History  Problem Relation Age of Onset  . Thyroid disease Mother   . Eczema Sister   . Thyroid disease Sister   . Thyroid disease Sister     Social History Social History    Tobacco Use  . Smoking status: Never Smoker  . Smokeless tobacco: Never Used  Substance Use Topics  . Alcohol use: No  . Drug use: No     Allergies   Banana; Doxycycline; Gentian violet-proflavine sulfate [triple dye]; Mold extract [trichophyton]; Morphine and related; and Vancomycin   Review of Systems Review of Systems   Physical Exam Triage Vital Signs ED Triage Vitals [08/08/17 1736]  Enc Vitals Group     BP 116/83     Pulse Rate (!) 104     Resp 18     Temp 98.6 F (37 C)     Temp Source Oral     SpO2 99 %  Weight      Height      Head Circumference      Peak Flow      Pain Score 0     Pain Loc      Pain Edu?      Excl. in [place]?    No data found.  Updated Vital Signs BP 116/83 (BP Location: Right Arm)   Pulse (!) 104   Temp 98.6 F (37 C) (Oral)   Resp 18   SpO2 99%   Visual Acuity Right Eye Distance:   Left Eye Distance:   Bilateral Distance:    Right Eye Near:   Left Eye Near:    Bilateral Near:     Physical Exam  Constitutional: She is oriented to person, place, and time. She appears well-developed and well-nourished.  HENT:  Head: Normocephalic and atraumatic.  No mucosal lesions  Cardiovascular:  tachycardia  Pulmonary/Chest: Effort normal and breath sounds normal.  Neurological: She is alert and oriented to person, place, and time.  Skin: Skin is warm and dry. Capillary refill takes less than 2 seconds. Rash noted. There is erythema.  Maculopapular rash throughout arms, chest, face and back. Specific erythematous papules surrounding bra line. No vesicles or drainage.   Psychiatric: She has a normal mood and affect.  Nursing note and vitals reviewed.    UC Treatments / Results  Labs (all labs ordered are listed, but only abnormal results are displayed) Labs Reviewed - No data to display  EKG None  Radiology No results found.  Procedures Procedures (including critical care time)  Medications Ordered in UC Medications  - No data to display  Initial Impression / Assessment and Plan / UC Course  I have reviewed the triage vital signs and the nursing notes.  Pertinent labs & imaging results that were available during my care of the patient were reviewed by me and considered in my medical decision making (see chart for details).    Pt has been seen for ongoing rash multiple times and given multiple different medications. She has also tried multiple soaps for symptom relief. She could be having a reaction to the bactrim, soaps, or unknown substance. She has stopped the bactrim but it could take a few days to clear the system. She has been using the triamcinolone cream with relief and benadryl. There is no concern today for steven johnson syndrome. She is not having any trouble breathing, swallowing nor does she has any mucosal lesions or open lesions on the body.   I will have her continue the triamcinolone cream, benadryl and give her a low dose steroid injection in the clinic. This may increase her blood sugars but she is aware and agreeable to this.   If she develops any worsening symptoms, trouble breathing, swallowing or worsening rash with open lesions she needs to go to the ER. Plan discussed with dad that was present, mom on the phone and patient. Everyone was agreeable to plan.  Final Clinical Impressions(s) / UC Diagnoses   Final diagnoses:  None   Discharge Instructions   None    ED Prescriptions    None     Controlled Substance Prescriptions [place] Controlled Substance Registry consulted? Not Applicable   Orvan July, NP 08/08/17 1927

## 2017-08-14 ENCOUNTER — Encounter: Payer: Self-pay | Admitting: Family Medicine

## 2017-08-14 ENCOUNTER — Ambulatory Visit (INDEPENDENT_AMBULATORY_CARE_PROVIDER_SITE_OTHER): Payer: Medicaid Other | Admitting: Family Medicine

## 2017-08-14 VITALS — BP 114/74 | HR 94 | Temp 98.5°F | Resp 20

## 2017-08-14 DIAGNOSIS — J4530 Mild persistent asthma, uncomplicated: Secondary | ICD-10-CM

## 2017-08-14 DIAGNOSIS — T781XXS Other adverse food reactions, not elsewhere classified, sequela: Secondary | ICD-10-CM | POA: Diagnosis not present

## 2017-08-14 DIAGNOSIS — C716 Malignant neoplasm of cerebellum: Secondary | ICD-10-CM

## 2017-08-14 DIAGNOSIS — J301 Allergic rhinitis due to pollen: Secondary | ICD-10-CM

## 2017-08-14 DIAGNOSIS — E109 Type 1 diabetes mellitus without complications: Secondary | ICD-10-CM

## 2017-08-14 DIAGNOSIS — L853 Xerosis cutis: Secondary | ICD-10-CM

## 2017-08-14 DIAGNOSIS — K219 Gastro-esophageal reflux disease without esophagitis: Secondary | ICD-10-CM

## 2017-08-14 MED ORDER — FLUTICASONE PROPIONATE 50 MCG/ACT NA SUSP: 1.0000 | Freq: Two times a day (BID) | NASAL | 5 refills | Status: DC | PRN

## 2017-08-14 NOTE — Patient Instructions (Addendum)
Continue environmental control of pollens dust mite and mold  Zyrtec 10 mg once a day if needed for runny nose or itchy eyes.  Fluticasone 1 spray per nostril twice a day if needed for stuffy nose Montelukast  10 mg once a day for coughing or wheezing ProAir 2 puffs every 4 hours if needed for wheezing or coughing spells Astelin nasal spray 2 sprays in each nostril twice a day as needed for nasal symptoms Continue omeprazole 20 mg once a day to control GERD Consider saline nasal spray prior to medicated nasal sprays  Continue other medications as noted in your chart.  Call me if this treatment plan is not working well for you  Follow up in 6 months or sooner as needed.

## 2017-08-14 NOTE — Progress Notes (Signed)
[place] [address] Dept: [phone]  FOLLOW UP NOTE  Patient ID: Tracey Perkins, female    DOB: 21-Aug-1985  Age: 32 y.o. MRN: [id number] Date of Office Visit: 08/14/2017  Assessment  Chief Complaint: Asthma and Allergies  HPI Tracey Perkins is a 32 year old female who presents to the clinic for a follow up visit. She is accompanied by her mother who assists with history. She was last seen in this clinic on 02/09/2017 by Dr Shaune Leeks for evaluation of asthma, allergic rhinitis, and possible food allergies. At that time, asthma was well controlled with montelukast 10 mg once a day and occasional use of her albuterol inhaler. Allergic rhinitis was well controlled with Claritin once a day and Flonase as needed.  At today's visit, she reports asthma as moderately well controlled. She reports occasional shortness of breath while at rest only. She denies shortness of breath with activity, and cough or wheeze with activity or rest. She is able to do relaxation techniques that help her to breathe when she feels short of breath with rest. She continues montelukast 10 mg once a day and will occasionally use her albuterol inhaler which also provides some relief. She is going to begin a new medication directed toward anxiety reduction within a couple of weeks.  Allergic rhinitis is reported as well controlled with cetirizine once a day and Flonase as needed which is about 2-3 days a week.  She reports a skin rash that began with small red, raised, itchy areas on her trunk. She went to her primary care provider and was given Keflex which was then switched to Bactrim. After 1 week of starting the Bactrim she developed a bright red rash and hives for which she received a steroid injection from urgent care. The rash has been improving and she has an appointment to see dermatology later this week.   Reflux is reported as well controlled. She continues to take omeprazole once a day.  Her  current medications are listed in the chart.   Drug Allergies:  Allergies  Allergen Reactions  . Banana Other (See Comments)    Mouth burning and swollen  . Bactrim [Sulfamethoxazole-Trimethoprim] Hives  . Doxycycline Hives  . Gentian Violet-Proflavine Sulfate [Triple Dye] Other (See Comments)    Mouth burn  . Mold Extract [Trichophyton] Other (See Comments)    Hay Fever  . Morphine And Related Hives  . Vancomycin Other (See Comments)    Red man syndrome     Physical Exam: BP 114/74   Pulse 94   Temp 98.5 F (36.9 C) (Oral)   Resp 20   SpO2 96%    Physical Exam  Constitutional: She is oriented to person, place, and time. She appears well-developed and well-nourished.  HENT:  Head: Normocephalic.  Right Ear: External ear normal.  Left Ear: External ear normal.  Nose: Nose normal.  Mouth/Throat: Oropharynx is clear and moist.  Eyes: Conjunctivae are normal.  Neck: Normal range of motion. Neck supple.  Cardiovascular: Normal rate, regular rhythm and normal heart sounds.  No murmur noted  Pulmonary/Chest: Effort normal and breath sounds normal.  Lungs clear to auscultation  Musculoskeletal: Normal range of motion.  Neurological: She is alert and oriented to person, place, and time.  Skin: Skin is warm.  Small papular erythematous areas noted on her stomach and back. No open areas or drainage noted.  Psychiatric: She has a normal mood and affect. Her behavior is normal. Judgment and thought content  normal.  Vitals reviewed.   Diagnostics: FVC 2.25, FEV1 2.08. Predicted FVC 3.65, predicted FEV1 3.09. Spirometry indicates a moderate restriction. This is consistent with previous readings.   Assessment and Plan: 1. Mild persistent asthma without complication   2. Adverse food reaction, sequela   3. Acute seasonal allergic rhinitis due to pollen   4. Xerosis cutis   5. Type 1 diabetes mellitus without complication (HCC)   6. 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 Gastroesophageal  reflux disease, esophagitis presence not specified     Meds ordered this encounter  Medications  . fluticasone (FLONASE) 50 MCG/ACT nasal spray    Sig: Place 1 spray into both nostrils 2 (two) times daily as needed (for stuffy nose).    Dispense:  16 g    Refill:  5    Patient Instructions  Continue environmental control of pollens dust mite and mold  Zyrtec 10 mg once a day if needed for runny nose or itchy eyes.  Fluticasone 1 spray per nostril twice a day if needed for stuffy nose Montelukast  10 mg once a day for coughing or wheezing ProAir 2 puffs every 4 hours if needed for wheezing or coughing spells Astelin nasal spray 2 sprays in each nostril twice a day as needed for nasal symptoms Continue omeprazole 20 mg once a day to control GERD Consider saline nasal spray prior to medicated nasal sprays  Continue other medications as noted in your chart.  Call me if this treatment plan is not working well for you  Follow up in 6 months or sooner as needed.   Return in about 6 months (around 02/14/2018), or if symptoms worsen or fail to improve.   Thank you for the opportunity to care for this patient.  Please do not hesitate to contact me with questions.  Gareth Morgan, FNP [facility]  I have provided oversight concerning Gareth Morgan' evaluation and treatment of this patient's health issues addressed during today's encounter. I agree with the assessment and therapeutic plan as outlined in the note.   Thank you for the opportunity to care for this patient.  Please do not hesitate to contact me with questions.  Penne Lash, M.D.  [facility] [address] [place], [place] [address] [phone]

## 2017-08-16 ENCOUNTER — Telehealth: Payer: Self-pay

## 2017-08-16 MED ORDER — EPINEPHRINE 0.3 MG/0.3ML IJ SOAJ: 0.3000 mg | Freq: Once | INTRAMUSCULAR | 2 refills | Status: AC

## 2017-08-16 NOTE — Telephone Encounter (Signed)
Pharmacy called and stated they have sent over two Rx for epi pen and hasn't heard anything back. Epi pen sent in per Gareth Morgan, NP.

## 2017-08-18 ENCOUNTER — Telehealth: Payer: Self-pay | Admitting: Family Medicine

## 2017-08-18 MED ORDER — EPINEPHRINE 0.3 MG/0.3ML IJ SOAJ: 0.3000 mg | Freq: Once | INTRAMUSCULAR | 2 refills | Status: AC

## 2017-08-18 NOTE — Telephone Encounter (Signed)
Pt was returning your call about the epi-pens. [phone].

## 2017-08-18 NOTE — Telephone Encounter (Signed)
Patient requesting an EpiPen for mouth burning and throat swelling with banana. I will send this to her pharmacy.

## 2017-08-27 ENCOUNTER — Encounter (HOSPITAL_COMMUNITY): Payer: Self-pay | Admitting: *Deleted

## 2017-08-27 ENCOUNTER — Emergency Department (HOSPITAL_COMMUNITY)
Admission: EM | Admit: 2017-08-27 | Discharge: 2017-08-28 | Disposition: A | Discharge status: Home / Self Care | Payer: Medicaid Other | Attending: Emergency Medicine | Admitting: Emergency Medicine

## 2017-08-27 ENCOUNTER — Other Ambulatory Visit: Payer: Self-pay

## 2017-08-27 DIAGNOSIS — E1040 Type 1 diabetes mellitus with diabetic neuropathy, unspecified: Secondary | ICD-10-CM | POA: Insufficient documentation

## 2017-08-27 DIAGNOSIS — R112 Nausea with vomiting, unspecified: Secondary | ICD-10-CM

## 2017-08-27 DIAGNOSIS — Z79899 Other long term (current) drug therapy: Secondary | ICD-10-CM | POA: Insufficient documentation

## 2017-08-27 DIAGNOSIS — R109 Unspecified abdominal pain: Secondary | ICD-10-CM | POA: Insufficient documentation

## 2017-08-27 DIAGNOSIS — R197 Diarrhea, unspecified: Secondary | ICD-10-CM | POA: Insufficient documentation

## 2017-08-27 DIAGNOSIS — E039 Hypothyroidism, unspecified: Secondary | ICD-10-CM | POA: Insufficient documentation

## 2017-08-27 DIAGNOSIS — Z9641 Presence of insulin pump (external) (internal): Secondary | ICD-10-CM | POA: Diagnosis not present

## 2017-08-27 LAB — CBG MONITORING, ED: GLUCOSE-CAPILLARY: 114 mg/dL — AB (ref 70–99)

## 2017-08-27 MED ORDER — ONDANSETRON 4 MG PO TBDP: 8.0000 mg | ORAL_TABLET | Freq: Once | ORAL | Status: DC

## 2017-08-27 NOTE — ED Triage Notes (Signed)
Diabetic with insulin pump  She has had abd pain with nausea and vomiting for 2 hours  A strong odor of ketones on her breath    Lmp; one month ago

## 2017-08-28 LAB — LIPASE, BLOOD: Lipase: 32 U/L (ref 11–51)

## 2017-08-28 LAB — COMPREHENSIVE METABOLIC PANEL
ALBUMIN: 3.7 g/dL (ref 3.5–5.0)
ALT: 18 U/L (ref 0–44)
ANION GAP: 15 (ref 5–15)
AST: 18 U/L (ref 15–41)
Alkaline Phosphatase: 87 U/L (ref 38–126)
BUN: 11 mg/dL (ref 6–20)
CO2: 25 mmol/L (ref 22–32)
Calcium: 9.4 mg/dL (ref 8.9–10.3)
Chloride: 100 mmol/L (ref 98–111)
Creatinine, Ser: 0.98 mg/dL (ref 0.44–1.00)
GFR calc Af Amer: >60 mL/min (ref >60)
GFR calc non Af Amer: >60 mL/min (ref >60)
GLUCOSE: 118 mg/dL — AB (ref 70–99)
Potassium: 3.4 mmol/L — ABNORMAL LOW (ref 3.5–5.1)
Sodium: 140 mmol/L (ref 135–145)
TOTAL PROTEIN: 7.3 g/dL (ref 6.5–8.1)
Total Bilirubin: 0.3 mg/dL (ref 0.3–1.2)

## 2017-08-28 LAB — CBC
HCT: 42.2 % (ref 36.0–46.0)
HEMOGLOBIN: 13.8 g/dL (ref 12.0–15.0)
MCH: 31 pg (ref 26.0–34.0)
MCHC: 32.7 g/dL (ref 30.0–36.0)
MCV: 94.8 fL (ref 78.0–100.0)
Platelets: 380 10*3/uL (ref 150–400)
RBC: 4.45 MIL/uL (ref 3.87–5.11)
RDW: 13.6 % (ref 11.5–15.5)
WBC: 9.7 10*3/uL (ref 4.0–10.5)

## 2017-08-28 LAB — URINALYSIS, ROUTINE W REFLEX MICROSCOPIC
Bilirubin Urine: NEGATIVE
Glucose, UA: 50 mg/dL — AB
Ketones, ur: NEGATIVE mg/dL
NITRITE: NEGATIVE
Protein, ur: NEGATIVE mg/dL
SPECIFIC GRAVITY, URINE: 1.008 (ref 1.005–1.030)
pH: 7 (ref 5.0–8.0)

## 2017-08-28 LAB — CBG MONITORING, ED
Glucose-Capillary: 186 mg/dL — ABNORMAL HIGH (ref 70–99)
Glucose-Capillary: 67 mg/dL — ABNORMAL LOW (ref 70–99)
Glucose-Capillary: 82 mg/dL (ref 70–99)

## 2017-08-28 MED ORDER — ONDANSETRON HCL 4 MG/2ML IJ SOLN
4.0000 mg | Freq: Once | INTRAMUSCULAR | Status: AC
  Administered 2017-08-28: 4 mg via INTRAVENOUS
  Filled 2017-08-28: qty 2

## 2017-08-28 MED ORDER — DEXTROSE 50 % IV SOLN
25.0000 mL | Freq: Once | INTRAVENOUS | Status: AC
  Administered 2017-08-28: 25 mL via INTRAVENOUS
  Filled 2017-08-28: qty 50

## 2017-08-28 MED ORDER — SODIUM CHLORIDE 0.9 % IV BOLUS
1000.0000 mL | Freq: Once | INTRAVENOUS | Status: AC
  Administered 2017-08-28: 1000 mL via INTRAVENOUS

## 2017-08-28 MED ORDER — ONDANSETRON 4 MG PO TBDP: 4.0000 mg | ORAL_TABLET | Freq: Three times a day (TID) | ORAL | 0 refills | Status: DC | PRN

## 2017-08-28 MED ORDER — DEXTROSE 50 % IV SOLN
1.0000 | Freq: Once | INTRAVENOUS | Status: AC
  Administered 2017-08-28: 50 mL via INTRAVENOUS
  Filled 2017-08-28: qty 50

## 2017-08-28 NOTE — Discharge Instructions (Signed)
Please continue to monitor your blood sugar closely.  If your blood sugar bottoms out again, please return to the emergency department.  Please use the prescribed Zofran to help with nausea.  You may advance your diet gradually over the next couple of days.  Your symptoms may last another 24 to 48 hours.  If you run a fever, have focal abdominal pain, or any other worsening symptoms return to the emergency department.

## 2017-08-28 NOTE — ED Notes (Signed)
Pt given soda and crackers. 

## 2017-08-28 NOTE — ED Provider Notes (Signed)
[facility] EMERGENCY DEPARTMENT Provider Note   CSN: [id number] Arrival date & time: 08/27/17  2339     History   Chief Complaint Chief Complaint  Patient presents with  . Abdominal Pain  . Blood Sugar Problem    HPI Tracey Perkins is a 32 y.o. female.  Patient presents to the emergency department with a chief complaint of nausea, vomiting, diarrhea.  She states that the symptoms started today.  She reports that she is also type I diabetic, and her blood sugar has been running low.  She is on an insulin pump, and has disconnected this.  She states that she has some generalized crampy abdominal pain.  She associates this with may be eating shrimp today at a restaurant.  She denies any known sick contacts.  Denies any fevers, or chills.  Denies any dysuria or hematuria.  She states that she is just starting her menstrual cycle.  The history is provided by the patient. No language interpreter was used.    Past Medical History:  Diagnosis Date  . Brain tumor ([place])   . Diabetes mellitus without complication ([place])   . Eczema   . Food allergy   . Gastroparesis   . Hypokalemia   . Hypomagnesemia   . Hypothyroid   . Neuropathy   . Thyroid disease   . Vision changes     Patient Active Problem List   Diagnosis Date Noted  . Adverse food reaction 02/09/2017  . Gastroesophageal reflux disease 02/09/2017  . Mild persistent asthma without complication 38/18/2993  . Acute seasonal allergic rhinitis due to pollen 11/16/2015  . Anaphylactic shock due to adverse food reaction 11/16/2015  . 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 Type 1 diabetes mellitus without complication ([place]) 71/69/6789  . Dry mouth 11/16/2015  . Xerosis cutis 11/16/2015    Past Surgical History:  Procedure Laterality Date  . BRAIN SURGERY    . Portacath placement and removed    . WISDOM TOOTH EXTRACTION       OB History   None      Home Medications    Prior to Admission medications    Medication Sig Start Date End Date Taking? Authorizing Provider  acetaminophen (TYLENOL) 500 MG tablet Take 500 mg by mouth as needed for pain.    [provider]  albuterol (PROAIR HFA) 108 (90 Base) MCG/ACT inhaler Inhale 2 puffs into the lungs every 4 (four) hours as needed. For coughing or wheezing spells 02/09/17   Ambs, Kathrine Cords, FNP  azelastine (ASTELIN) 0.1 % nasal spray Place 2 sprays into both nostrils 2 (two) times daily. As needed for nasal symptoms 02/09/17   Ambs, Kathrine Cords, FNP  cetirizine (ZYRTEC) 10 MG tablet Take 1 tablet (10 mg total) by mouth daily. 02/16/17   Dara Hoyer, FNP  Continuous Blood Gluc Sensor (FREESTYLE LIBRE SENSOR SYSTEM) MISC USE 1 DEVICE EVERY 10 (TEN) DAYS. 01/29/17   [provider]  DIPHENHYDRAMINE HCL, TOPICAL, 2 % GEL Apply to AA, every 6 hours, prn 07/29/17   [provider]  fluticasone (FLONASE) 50 MCG/ACT nasal spray Place 1 spray into both nostrils 2 (two) times daily as needed (for stuffy nose). 08/14/17   Dara Hoyer, FNP  gabapentin (NEURONTIN) 300 MG capsule TAKE 1 CAPSULE BY MOUTH EVERY DAY AT NIGHT 01/25/17   [provider]  glucagon (GLUCAGON EMERGENCY) 1 MG injection Inject 1 mg into the skin as needed. 06/30/16   [provider]  hydrocortisone  2.5 % cream Apply 1 application topically daily as needed for itching. 09/22/16   [provider]  Insulin Human (INSULIN PUMP) SOLN Inject into the skin. novolog    [provider]  KLOR-CON 10 10 MEQ tablet Take 10 mEq by mouth daily. 01/28/17   [provider]  levothyroxine (SYNTHROID, LEVOTHROID) 88 MCG tablet Take 88 mcg by mouth daily before breakfast.    [provider]  loratadine (CLARITIN) 10 MG tablet Take 1 tablet (10 mg total) daily by mouth. Patient not taking: Reported on 08/14/2017 11/28/16   Charlies Silvers, MD  lubiprostone (AMITIZA) 8 MCG capsule Take 8 mcg by mouth 2 (two) times daily with a meal.    [provider]  magnesium oxide (MAG-OX) 400 MG tablet Take 800 mg by mouth at bedtime.    [provider]  mometasone (ELOCON) 0.1 % cream Apply 1 application topically daily as needed for itching. 09/02/16   [provider]  montelukast (SINGULAIR) 10 MG tablet Take 1 tablet (10 mg total) by mouth at bedtime. To prevent coughing or wheezing 02/09/17   Ambs, Kathrine Cords, FNP  NOVOLOG 100 UNIT/ML injection USE UP TO 50 UNITS DAILY, AS DIRECTED IN INSULIN PUMP. 01/28/17   [provider]  omeprazole (PRILOSEC) 20 MG capsule Take 1 capsule (20 mg total) by mouth daily as needed. For reflux Patient not taking: Reported on 08/14/2017 02/09/17   Dara Hoyer, FNP  ondansetron (ZOFRAN ODT) 4 MG disintegrating tablet Take 1 tablet (4 mg total) by mouth every 8 (eight) hours as needed for nausea. 06/30/16   Tanna Furry, MD  Probiotic Product (VSL#3 DS) PACK Take 1 each by mouth 2 (two) times daily as needed (supplement).     [provider]  triamcinolone cream (KENALOG) 0.1 % Apply neck down 1-2 times a day for bug cites, dry itchy skin, poison ivy etc 08/04/17   [provider]  [name] 150-35 MCG/24HR transdermal patch Apply 1 packet topically every Monday.  05/28/16   [provider]    Family History Family History  Problem Relation Age of Onset  . Thyroid disease Mother   . Eczema Sister   . Thyroid disease Sister   . Thyroid disease Sister     Social History Social History   Tobacco Use  . Smoking status: Never Smoker  . Smokeless tobacco: Never Used  Substance Use Topics  . Alcohol use: No  . Drug use: No     Allergies   Banana; Bactrim [sulfamethoxazole-trimethoprim]; Doxycycline; Gentian violet-proflavine sulfate [triple dye]; Mold extract [trichophyton]; Morphine and related; and Vancomycin   Review of Systems Review of Systems  All other systems reviewed and are negative.    Physical Exam Updated Vital Signs BP 102/88   Pulse  (!) 124   Resp (!) 26   Ht 5' 2.5" (1.588 m)   Wt 79.4 kg   LMP 07/27/2017   SpO2 96%   BMI 31.50 kg/m   Physical Exam  Constitutional: She is oriented to person, place, and time. She appears well-developed and well-nourished.  HENT:  Head: Normocephalic and atraumatic.  Eyes: Pupils are equal, round, and reactive to light. Conjunctivae and EOM are normal.  Neck: Normal range of motion. Neck supple.  Cardiovascular: Normal rate and regular rhythm. Exam reveals no gallop and no friction rub.  No murmur heard. Pulmonary/Chest: Effort normal and breath sounds normal. No respiratory distress. She has no wheezes. She has no rales. She exhibits no  tenderness.  Abdominal: Soft. Bowel sounds are normal. She exhibits no distension and no mass. There is no tenderness. There is no rebound and no guarding.  No focal abdominal tenderness, no RLQ tenderness or pain at McBurney's point, no RUQ tenderness or Murphy's sign, no left-sided abdominal tenderness, no fluid wave, or signs of peritonitis   Musculoskeletal: Normal range of motion. She exhibits no edema or tenderness.  Neurological: She is alert and oriented to person, place, and time.  Skin: Skin is warm and dry.  Psychiatric: She has a normal mood and affect. Her behavior is normal. Judgment and thought content normal.  Nursing note and vitals reviewed.    ED Treatments / Results  Labs (all labs ordered are listed, but only abnormal results are displayed) Labs Reviewed  CBG MONITORING, ED - Abnormal; Notable for the following components:      Result Value   Glucose-Capillary 114 (*)    All other components within normal limits  LIPASE, BLOOD  COMPREHENSIVE METABOLIC PANEL  CBC  URINALYSIS, ROUTINE W REFLEX MICROSCOPIC  KETONES, URINE  I-STAT BETA HCG BLOOD, ED ([facility], [facility], [facility] ONLY)  CBG MONITORING, ED    EKG None  Radiology No results found.  Procedures Procedures (including critical care time)  Medications Ordered in  ED Medications  dextrose 50 % solution 25 mL (has no administration in time range)  sodium chloride 0.9 % bolus 1,000 mL (has no administration in time range)  ondansetron (ZOFRAN) injection 4 mg (has no administration in time range)     Initial Impression / Assessment and Plan / ED Course  I have reviewed the triage vital signs and the nursing notes.  Pertinent labs & imaging results that were available during my care of the patient were reviewed by me and considered in my medical decision making (see chart for details).     Patient is type I diabetic.  Presents with nausea, vomiting, diarrhea.  Onset was today.  Most concerning for viral etiology, however will check labs and reassess.  Patient reports that she still has insulin in her system from her evening meal, but she was unable to eat this.  Her glucose is currently 80 per her insulin pump and downtrending.  Will give one half amp of dextrose to prevent this from dropping too low.  Glucose stabilized, and then dropped back down into the 60s.  Patient was given 1 full amp of dextrose.  Glucose 186.  Patient now reports that she is feeling improved after fluids.  She is tolerating oral intake.  Her glucose has stabilized.  Will discharge patient to home.  Patient and family understand and agree with plan.  She will return if her symptoms worsen.  Final Clinical Impressions(s) / ED Diagnoses   Final diagnoses:  Nausea vomiting and diarrhea    ED Discharge Orders    None       Montine Circle, PA-C 08/28/17 [place], April, MD 08/28/17 [phone]

## 2017-08-29 ENCOUNTER — Ambulatory Visit (HOSPITAL_COMMUNITY)
Admission: RE | Admit: 2017-08-29 | Discharge: 2017-08-29 | Disposition: A | Discharge status: Home / Self Care | Payer: Medicaid Other | Source: Ambulatory Visit | Attending: Internal Medicine | Admitting: Internal Medicine

## 2017-08-29 ENCOUNTER — Other Ambulatory Visit (HOSPITAL_COMMUNITY): Payer: Self-pay | Admitting: Internal Medicine

## 2017-08-29 DIAGNOSIS — R609 Edema, unspecified: Secondary | ICD-10-CM | POA: Insufficient documentation

## 2017-08-29 DIAGNOSIS — I82611 Acute embolism and thrombosis of superficial veins of right upper extremity: Secondary | ICD-10-CM | POA: Diagnosis not present

## 2017-08-29 NOTE — Progress Notes (Addendum)
RUE venous duplex prelim: negative for DVT. Superficial thrombosis noted in the cephalic vein (forearm to wrist) and in a superficial branch extending to the hand.  Landry Mellow, RDMS, RVT  Called office with results, waiting for return call from Dr. Joylene Draft on call. 5:32 PM Received call back at 5:52 PM Dr. Joylene Draft talked to patient and mother.

## 2017-10-05 DIAGNOSIS — L858 Other specified epidermal thickening: Secondary | ICD-10-CM | POA: Insufficient documentation

## 2017-10-05 DIAGNOSIS — D225 Melanocytic nevi of trunk: Secondary | ICD-10-CM | POA: Insufficient documentation

## 2017-12-15 ENCOUNTER — Other Ambulatory Visit: Payer: Self-pay | Admitting: Family Medicine

## 2018-02-05 ENCOUNTER — Other Ambulatory Visit: Payer: Self-pay | Admitting: Family Medicine

## 2018-02-05 NOTE — Telephone Encounter (Signed)
Patient needs office visit.  

## 2018-02-19 ENCOUNTER — Encounter: Payer: Self-pay | Admitting: Family Medicine

## 2018-02-19 ENCOUNTER — Other Ambulatory Visit: Payer: Self-pay | Admitting: Allergy

## 2018-02-19 ENCOUNTER — Ambulatory Visit (INDEPENDENT_AMBULATORY_CARE_PROVIDER_SITE_OTHER): Payer: Medicaid Other | Admitting: Family Medicine

## 2018-02-19 VITALS — BP 96/60 | HR 84 | Temp 98.1°F | Resp 16 | Ht 63.0 in | Wt 179.7 lb

## 2018-02-19 DIAGNOSIS — K219 Gastro-esophageal reflux disease without esophagitis: Secondary | ICD-10-CM | POA: Diagnosis not present

## 2018-02-19 DIAGNOSIS — J4530 Mild persistent asthma, uncomplicated: Secondary | ICD-10-CM

## 2018-02-19 DIAGNOSIS — T781XXS Other adverse food reactions, not elsewhere classified, sequela: Secondary | ICD-10-CM | POA: Diagnosis not present

## 2018-02-19 DIAGNOSIS — J301 Allergic rhinitis due to pollen: Secondary | ICD-10-CM

## 2018-02-19 MED ORDER — ALBUTEROL SULFATE HFA 108 (90 BASE) MCG/ACT IN AERS: 2.0000 | INHALATION_SPRAY | RESPIRATORY_TRACT | 1 refills | Status: DC | PRN

## 2018-02-19 MED ORDER — EPINEPHRINE 0.3 MG/0.3ML IJ SOAJ: 0.3000 mg | Freq: Once | INTRAMUSCULAR | 2 refills | Status: AC

## 2018-02-19 MED ORDER — AZELASTINE HCL 0.1 % NA SOLN: 2.0000 | Freq: Two times a day (BID) | NASAL | 5 refills | Status: DC

## 2018-02-19 MED ORDER — CETIRIZINE HCL 10 MG PO TABS
10.0000 mg | ORAL_TABLET | Freq: Every day | ORAL | 5 refills | Status: DC
Start: 2018-02-19 — End: 2018-11-22

## 2018-02-19 MED ORDER — FLUTICASONE PROPIONATE 50 MCG/ACT NA SUSP: NASAL | 5 refills | Status: DC

## 2018-02-19 MED ORDER — EPINEPHRINE 0.3 MG/0.3ML IJ SOAJ: INTRAMUSCULAR | 1 refills | Status: DC

## 2018-02-19 NOTE — Progress Notes (Signed)
[facility] [address] Dept: [phone]  FOLLOW UP NOTE  Patient ID: Tracey Perkins, female    DOB: Jul 17, 1985  Age: 33 y.o. MRN: [id number] Date of Office Visit: 02/19/2018  Assessment  Chief Complaint: Allergic Rhinitis  and Asthma  HPI Tracey Perkins is a 33 year old female who presents to the clinic for a follow up visit. She is accompanied by her mother who assists with history. She reports her asthma has been well controlled with montelukast 10 mg once a day and infrequent use of her albuterol inhaler. She reports allergic rhinitis as moderately well controlled with frequent nasal and bilateral ear congestion. She has recently started using new hearing aids.She is currently using azelastine nasal spray and taking cetirizine daily. She is using Flonase infrequently and not using saline nasal rinse. She reports that she has had an increase in heartburn over the last few weeks and is going to see her GI specialist later this week. Her current medications are listed in the chart.   Drug Allergies:  Allergies  Allergen Reactions  . Banana Other (See Comments)    Mouth burning and swollen  . Bactrim [Sulfamethoxazole-Trimethoprim] Hives  . Doxycycline Hives  . Gentian Violet Other (See Comments)    "It burns."  Per mother  . Gentian Violet-Proflavine Sulfate [Triple Dye] Other (See Comments)    Mouth burn  . Mold Extract [Trichophyton] Other (See Comments)    Hay Fever  . Morphine And Related Hives  . Other Other (See Comments) and Hives    "Hay fever, dust and pollen."  Per patient.  . Vancomycin Other (See Comments)    Red man syndrome     Physical Exam: BP 96/60 (BP Location: Right Arm, Patient Position: Sitting, Cuff Size: Normal)   Pulse 84   Temp 98.1 F (36.7 C) (Oral)   Resp 16   Ht 5\' 3"  (1.6 m)   Wt 179 lb 10.8 oz (81.5 kg)   SpO2 97%   BMI 31.83 kg/m    Physical Exam Vitals signs reviewed.  Constitutional:      Appearance: Normal  appearance.  HENT:     Head: Normocephalic and atraumatic.     Ears:     Comments: Left TM retracted, right TM normal. No redness noted. She has new hearing aids     Nose: Nose normal.     Comments: Bilateral nares normal. Ears normal. Pharynx normal. Eyes normal.     Mouth/Throat:     Pharynx: Oropharynx is clear.  Eyes:     Conjunctiva/sclera: Conjunctivae normal.  Neck:     Musculoskeletal: Normal range of motion and neck supple.  Cardiovascular:     Rate and Rhythm: Normal rate and regular rhythm.     Heart sounds: Normal heart sounds. No murmur.  Pulmonary:     Effort: Pulmonary effort is normal.     Breath sounds: Normal breath sounds.     Comments: Lungs clear to auscultation Musculoskeletal: Normal range of motion.  Skin:    General: Skin is warm and dry.  Neurological:     Mental Status: She is alert and oriented to person, place, and time.  Psychiatric:        Mood and Affect: Mood normal.        Behavior: Behavior normal.        Thought Content: Thought content normal.        Judgment: Judgment normal.     Diagnostics: FVC 2.43, FEV1 2.20.  Predicted FVC 3.65, predicted FEV1 3.08. Spirometry indicates mild restriction. This is consistent with previous spirometry readings.   Assessment and Plan: 1. Mild persistent asthma without complication   2. Acute seasonal allergic rhinitis due to pollen   3. Gastroesophageal reflux disease, esophagitis presence not specified   4. Adverse food reaction, sequela     Meds ordered this encounter  Medications  . albuterol (PROAIR HFA) 108 (90 Base) MCG/ACT inhaler    Sig: Inhale 2 puffs into the lungs every 4 (four) hours as needed.    Dispense:  1 Inhaler    Refill:  1    Please keep rx on file pt. Will call when needed.  Marland Kitchen azelastine (ASTELIN) 0.1 % nasal spray    Sig: Place 2 sprays into both nostrils 2 (two) times daily.    Dispense:  30 mL    Refill:  5    Please keep rx on file. Pt. Will call when needed.  .  cetirizine (ZYRTEC) 10 MG tablet    Sig: Take 1 tablet (10 mg total) by mouth daily.    Dispense:  34 tablet    Refill:  5    Please keep rx on file. Pt. Will call when needed.  Marland Kitchen EPINEPHrine 0.3 mg/0.3 mL IJ SOAJ injection    Sig: Use as directed for severe allergic reaction.    Dispense:  2 Device    Refill:  1    Please keep rx on file. Pt. Will call when needed.  . fluticasone (FLONASE) 50 MCG/ACT nasal spray    Sig: 2 sprays per nostril daily as needed for stuffy nose.    Dispense:  16 g    Refill:  5    Patient Instructions  Allergic rhinitis Continue environmental control of pollens dust mite and mold  Zyrtec 10 mg once a day if needed for runny nose or itchy eyes.  Consider saline nasal spray prior to medicated nasal sprays Fluticasone 1 spray per nostril twice a day if needed for stuffy nose. This may help your ears  Astelin nasal spray 2 sprays in each nostril twice a day as needed for runny nose or sneezing  Asthma Montelukast  10 mg once a day for coughing or wheezing ProAir 2 puffs every 4 hours if needed for wheezing or coughing spells  Reflux Continue omeprazole 20 mg once a day to control reflux Follow up with your Gastroenterologist as you had planned  Adverse food reaction Continue to avoid banana. In case of an allergic reaction, take Benadryl 50 mg every 4 hours, and if life-threatening symptoms occur, inject with EpiPen 0.3 mg.  Continue other medications as noted in your chart.  Call me if this treatment plan is not working well for you  Follow up in 6 months or sooner as needed.   Return in about 6 months (around 08/20/2018), or if symptoms worsen or fail to improve.   Thank you for the opportunity to care for this patient.  Please do not hesitate to contact me with questions.  Gareth Morgan, FNP [facility]  I have provided oversight concerning Gareth Morgan' evaluation and treatment of this  patient's health issues addressed during today's encounter. I agree with the assessment and therapeutic plan as outlined in the note.   Thank you for the opportunity to care for this patient.  Please do not hesitate to contact me with questions.  Penne Lash, M.D.  [facility] [address] [place], [place] [address] [phone]

## 2018-02-19 NOTE — Patient Instructions (Addendum)
Allergic rhinitis Continue environmental control of pollens dust mite and mold  Zyrtec 10 mg once a day if needed for runny nose or itchy eyes.  Consider saline nasal spray prior to medicated nasal sprays Fluticasone 1 spray per nostril twice a day if needed for stuffy nose. This may help your ears  Astelin nasal spray 2 sprays in each nostril twice a day as needed for runny nose or sneezing  Asthma Montelukast  10 mg once a day for coughing or wheezing ProAir 2 puffs every 4 hours if needed for wheezing or coughing spells  Reflux Continue omeprazole 20 mg once a day to control reflux Follow up with your Gastroenterologist as you had planned  Adverse food reaction Continue to avoid banana. In case of an allergic reaction, take Benadryl 50 mg every 4 hours, and if life-threatening symptoms occur, inject with EpiPen 0.3 mg.  Continue other medications as noted in your chart.  Call me if this treatment plan is not working well for you  Follow up in 6 months or sooner as needed.

## 2018-02-21 ENCOUNTER — Other Ambulatory Visit: Payer: Self-pay | Admitting: Allergy

## 2018-02-21 MED ORDER — EPINEPHRINE 0.3 MG/0.3ML IJ SOAJ: 0.3000 mg | Freq: Once | INTRAMUSCULAR | 2 refills | Status: AC

## 2018-03-02 ENCOUNTER — Other Ambulatory Visit: Payer: Self-pay | Admitting: Family Medicine

## 2018-04-06 ENCOUNTER — Telehealth: Payer: Self-pay

## 2018-04-06 NOTE — Telephone Encounter (Signed)
Pt calling asking for the recipe for saline nasal wash.  Gave pt verbal recipe and will mail instruction teaching sheet.

## 2018-04-23 ENCOUNTER — Ambulatory Visit: Payer: Medicaid Other | Admitting: Physical Therapy

## 2018-08-20 ENCOUNTER — Other Ambulatory Visit: Payer: Self-pay

## 2018-08-20 ENCOUNTER — Ambulatory Visit (INDEPENDENT_AMBULATORY_CARE_PROVIDER_SITE_OTHER): Payer: Medicaid Other | Admitting: Family Medicine

## 2018-08-20 ENCOUNTER — Encounter: Payer: Self-pay | Admitting: Family Medicine

## 2018-08-20 VITALS — BP 138/90 | HR 105 | Temp 96.7°F | Resp 18

## 2018-08-20 DIAGNOSIS — J301 Allergic rhinitis due to pollen: Secondary | ICD-10-CM

## 2018-08-20 DIAGNOSIS — K219 Gastro-esophageal reflux disease without esophagitis: Secondary | ICD-10-CM | POA: Diagnosis not present

## 2018-08-20 DIAGNOSIS — C716 Malignant neoplasm of cerebellum: Secondary | ICD-10-CM

## 2018-08-20 DIAGNOSIS — T781XXS Other adverse food reactions, not elsewhere classified, sequela: Secondary | ICD-10-CM

## 2018-08-20 DIAGNOSIS — J4531 Mild persistent asthma with (acute) exacerbation: Secondary | ICD-10-CM

## 2018-08-20 DIAGNOSIS — E109 Type 1 diabetes mellitus without complications: Secondary | ICD-10-CM

## 2018-08-20 MED ORDER — FLOVENT HFA 110 MCG/ACT IN AERO: 2.0000 | INHALATION_SPRAY | RESPIRATORY_TRACT | 5 refills | Status: DC

## 2018-08-20 NOTE — Patient Instructions (Addendum)
Allergic rhinitis Continue Zyrtec 10 mg once a day if needed for runny nose or itchy eyes.  Begin saline nasal spray prior to medicated nasal sprays Fluticasone 1 spray per nostril twice a day if needed for stuffy nose. This may help your ears  Astelin nasal spray 2 sprays in each nostril twice a day as needed for runny nose or sneezing Continue environmental control of pollens dust mite and mold  Consider allergen immunotherapy if medications are not effective in controlling your symptoms Recommend referral to ENT at [facility] for further evaluation  Asthma Begin Flovent 110-2 puffs once a day with a spacer to prevent cough or wheeze Continue montelukast  10 mg once a day for coughing or wheezing ProAir 2 puffs every 4 hours if needed for wheezing or coughing spells  Reflux Continue omeprazole 20 mg twice a day to control reflux Follow up with your Gastroenterologist as you normally do  Adverse food reaction Continue to avoid banana. In case of an allergic reaction, take Benadryl 50 mg every 4 hours, and if life-threatening symptoms occur, inject with EpiPen 0.3 mg.  Continue other medications as noted in your chart.  Call me if this treatment plan is not working well for you  Follow up in 3 months or sooner as needed.

## 2018-08-20 NOTE — Progress Notes (Signed)
[facility] [address] Dept: [phone]  FOLLOW UP NOTE  Patient ID: Tracey Perkins, female    DOB: 17-Mar-1985  Age: 33 y.o. MRN: [id number] Date of Office Visit: 08/20/2018  Assessment  Chief Complaint: Asthma  HPI Tracey Perkins is a 33 year old female who presents to the clinic for a follow up visit. She reports her allergic rhinitis has not been well controlled with post nasal drainage, clear rhinorrhea, and occasional intermittent sinus headache. She reports that her symptoms have progressed gradually since her last visit to this clinic. She reports that she has started using new hearing aids and coupled with nasal drainage she has been losing her balance and experiencing nystagmus on a more regular basis. She continues cetirizine 10 mg once a day, Flonase and azelastine daily. She reports occular pruritus for which she is using Clear Eyes with relief. She reports antihistamine eye drops burn her eyes. Asthma is reported as not well controlled with shortness of breath that is aggravated by heat, dust, and before and after rain. She continues montelukast 10 mg once a day and has been using her inhaler about 3-5 times a week over the last month. She reports an increase in heartburn over the last month. She continues taking omeprazole 20 mg once a day and her primary care provider has recently recommended to increase the dose to omeprazole 20 mg twice a day. She reports she often only takes one dose a day. Her current medications are listed in the chart.    Drug Allergies:  Allergies  Allergen Reactions  . Banana Other (See Comments)    Mouth burning and swollen  . Bactrim [Sulfamethoxazole-Trimethoprim] Hives  . Doxycycline Hives  . Gentian Violet Other (See Comments)    "It burns."  Per mother  . Gentian Violet-Proflavine Sulfate [Triple Dye] Other (See Comments)    Mouth burn  . Mold Extract [Trichophyton] Other (See Comments)    Hay Fever  . Morphine And Related  Hives  . Other Other (See Comments) and Hives    "Hay fever, dust and pollen."  Per patient.  . Vancomycin Other (See Comments)    Red man syndrome     Physical Exam: BP 138/90   Pulse (!) 105   Temp (!) 96.7 F (35.9 C) (Temporal)   Resp 18   SpO2 95%    Physical Exam Vitals signs reviewed.  Constitutional:      Appearance: Normal appearance.  HENT:     Head: Normocephalic and atraumatic.     Right Ear: Tympanic membrane normal.     Left Ear: Tympanic membrane normal.     Nose:     Comments: Bilateral nares normal. Pharynx normal. Eyes normal. Eyes normal.    Mouth/Throat:     Pharynx: Oropharynx is clear.  Eyes:     Conjunctiva/sclera: Conjunctivae normal.  Neck:     Musculoskeletal: Normal range of motion and neck supple.  Cardiovascular:     Rate and Rhythm: Normal rate and regular rhythm.     Heart sounds: Normal heart sounds. No murmur.  Pulmonary:     Effort: Pulmonary effort is normal.     Breath sounds: Normal breath sounds.     Comments: Lungs clear to auscultation Musculoskeletal: Normal range of motion.  Skin:    General: Skin is warm and dry.  Neurological:     Mental Status: She is alert and oriented to person, place, and time.  Psychiatric:  Mood and Affect: Mood normal.        Behavior: Behavior normal.        Thought Content: Thought content normal.        Judgment: Judgment normal.     Diagnostics: FVC 2.04, FEV1 1.77. Predicted FVC 3.65, predicted FEV1 3.08. Spirometry indicates moderate restriction. This is consistent with previous results.   Assessment and Plan: 1. Mild persistent asthma with acute exacerbation   2. Adverse food reaction, sequela   3. Acute seasonal allergic rhinitis due to pollen   4. Gastroesophageal reflux disease, esophagitis presence not specified   5. Type 1 diabetes mellitus without complication ([facility])   6. 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 fluticasone (FLOVENT HFA)  110 MCG/ACT inhaler    Sig: Inhale 2 puffs into the lungs 1 day or 1 dose for 1 dose.    Dispense:  1 Inhaler    Refill:  5    Patient Instructions  Allergic rhinitis Continue Zyrtec 10 mg once a day if needed for runny nose or itchy eyes.  Begin saline nasal spray prior to medicated nasal sprays Fluticasone 1 spray per nostril twice a day if needed for stuffy nose. This may help your ears  Astelin nasal spray 2 sprays in each nostril twice a day as needed for runny nose or sneezing Continue environmental control of pollens dust mite and mold  Consider allergen immunotherapy if medications are not effective in controlling your symptoms Recommend referral to ENT at [facility] for further evaluation  Asthma Begin Flovent 110-2 puffs once a day with a spacer to prevent cough or wheeze Continue montelukast  10 mg once a day for coughing or wheezing ProAir 2 puffs every 4 hours if needed for wheezing or coughing spells  Reflux Continue omeprazole 20 mg twice a day to control reflux Follow up with your Gastroenterologist as you normally do  Adverse food reaction Continue to avoid banana. In case of an allergic reaction, take Benadryl 50 mg every 4 hours, and if life-threatening symptoms occur, inject with EpiPen 0.3 mg.  Continue other medications as noted in your chart.  Call me if this treatment plan is not working well for you  Follow up in 3 months or sooner as needed.   Return in about 3 months (around 11/20/2018), or if symptoms worsen or fail to improve.   Thank you for the opportunity to care for this patient.  Please do not hesitate to contact me with questions.  Gareth Morgan, FNP [facility]  Thank you for the opportunity to care for this patient.  Please do not hesitate to contact me with questions.  Penne Lash, M.D.  [facility] [address] [place], [place] [address] [phone]

## 2018-08-22 ENCOUNTER — Telehealth: Payer: Self-pay | Admitting: Family Medicine

## 2018-08-22 NOTE — Telephone Encounter (Signed)
Pt called because she was seen by anne Monday and wanted to know if we had done the referral to a ent to [place] . [phone].

## 2018-08-29 NOTE — Telephone Encounter (Signed)
I am so sorry for not passing this on. She has new hearing aids and new nystagmus with a large amount of nasal drainage despite aggressive medical treatment. Please refer to ENT for further eval. Thank you!

## 2018-08-29 NOTE — Telephone Encounter (Signed)
Lamar,   I didn't get a message on this patient. What is the patient needing to be seen for @ [facility]?   Thanks

## 2018-08-30 NOTE — Telephone Encounter (Signed)
Thank you Dee

## 2018-08-30 NOTE — Telephone Encounter (Signed)
Referral has been faxed to [facility]. [facility] doesn't allow me to schedule so they will contact the patient to schedule the new patient appointment.   I have left a voicemail on the patients phone with this information.   [phone]: AEW [phone]; Phone   I will follow up with the patient again in a week to see if she has been scheduled.

## 2018-11-22 ENCOUNTER — Encounter: Payer: Self-pay | Admitting: Family Medicine

## 2018-11-22 ENCOUNTER — Ambulatory Visit (INDEPENDENT_AMBULATORY_CARE_PROVIDER_SITE_OTHER): Payer: Medicaid Other | Admitting: Family Medicine

## 2018-11-22 ENCOUNTER — Other Ambulatory Visit: Payer: Self-pay

## 2018-11-22 VITALS — BP 110/80 | HR 94 | Temp 97.1°F | Resp 16 | Ht 63.0 in | Wt 181.4 lb

## 2018-11-22 DIAGNOSIS — H1010 Acute atopic conjunctivitis, unspecified eye: Secondary | ICD-10-CM | POA: Diagnosis not present

## 2018-11-22 DIAGNOSIS — E2839 Other primary ovarian failure: Secondary | ICD-10-CM | POA: Insufficient documentation

## 2018-11-22 DIAGNOSIS — J3089 Other allergic rhinitis: Secondary | ICD-10-CM | POA: Diagnosis not present

## 2018-11-22 DIAGNOSIS — J4530 Mild persistent asthma, uncomplicated: Secondary | ICD-10-CM | POA: Diagnosis not present

## 2018-11-22 DIAGNOSIS — Z923 Personal history of irradiation: Secondary | ICD-10-CM | POA: Insufficient documentation

## 2018-11-22 DIAGNOSIS — K219 Gastro-esophageal reflux disease without esophagitis: Secondary | ICD-10-CM

## 2018-11-22 DIAGNOSIS — T781XXS Other adverse food reactions, not elsewhere classified, sequela: Secondary | ICD-10-CM

## 2018-11-22 DIAGNOSIS — J019 Acute sinusitis, unspecified: Secondary | ICD-10-CM | POA: Insufficient documentation

## 2018-11-22 DIAGNOSIS — E063 Autoimmune thyroiditis: Secondary | ICD-10-CM | POA: Insufficient documentation

## 2018-11-22 DIAGNOSIS — J302 Other seasonal allergic rhinitis: Secondary | ICD-10-CM | POA: Insufficient documentation

## 2018-11-22 MED ORDER — CETIRIZINE HCL 10 MG PO TABS: 10.0000 mg | ORAL_TABLET | Freq: Every day | ORAL | 5 refills | Status: DC

## 2018-11-22 MED ORDER — FLUTICASONE PROPIONATE 50 MCG/ACT NA SUSP: NASAL | 5 refills | Status: DC

## 2018-11-22 MED ORDER — MONTELUKAST SODIUM 10 MG PO TABS: 10.0000 mg | ORAL_TABLET | Freq: Every day | ORAL | 5 refills | Status: DC

## 2018-11-22 NOTE — Progress Notes (Addendum)
[place] [id number] Dept: [phone]  FOLLOW UP NOTE  Patient ID: Tracey Perkins, female    DOB: 1985/12/14  Age: 33 y.o. MRN: [id number] Date of Office Visit: 11/22/2018  Assessment  Chief Complaint: Asthma (saw ENT at [facility] this week.  did CT sinus.  abnormal.  ) and Sinusitis (ENT wanted pt to start a Zpak.  Pt wants to discuss med before starting with Tracey Perkins.  Digestive problems.)  HPI Tracey Perkins is a 33 year old female who presents to the clinic for a follow up visit. She was last seen in this clinic on 08/20/2018 by Dr. 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 She has a history of medulloblastoma and uncontrolled type 1 diabetes. At today's visit, she reports her asthma has been moderately well controlled with no shortness of breath or cough with activity or rest. She reports occasional wheeze with laughter. She continues montelukast 10 mg once a day and albuterol about 1 time a week with relief of symptoms. She reports that she has used her Flovent inhaler just 2 times just after her last visit to this clinic with relief of symptoms. Allergic rhinitis is reported as not well controlled with post nasal drainage, clear rhinorrhea, and occasional sinus headache. She has recently been evaluated by Dr. Earnestine Perkins at [facility] for nystagmus thought to be aggravated by her new hearing aids. At that time, she had a CT scan of her sinuses indicating "1. Minimal polypoid mucosal thickening within the maxillary sinuses. No air-fluid levels. 2. Major sinus drainage pathways are patent." for which she  Has been prescribed azithromycin for 28 days. She has not started this medication at this time. She continues cetirizine 10 mg once a day, Flonase and azelastine daily. Allergic conjunctivitis is reported as moderately well controlled with occasional occular pruritus for which she has used Clear Eyes eye drops with moderate relief.  She reports that antihistamine eye drops, in the past, have given her a stinging feeling upon application. Reflux is reported as moderately well controlled with omeprazole 1-2 times a day. She reports that she frequently forgets to take the second dose of omeprazole. She continues to avoid banana and always has access to epinephrine auto-injector devices. Her current medications are listed in the chart.   Drug Allergies:  Allergies  Allergen Reactions   Banana Other (See Comments)    Mouth burning and swollen   Bactrim [Sulfamethoxazole-Trimethoprim] Hives   Doxycycline Hives   Gentian Violet Other (See Comments)    "It burns."  Per mother   Gentian Violet-Proflavine Sulfate [Triple Dye] Other (See Comments)    Mouth burn   Mold Extract [Trichophyton] Other (See Comments)    Hay Fever   Morphine And Related Hives   Other Other (See Comments) and Hives    "Hay fever, dust and pollen."  Per patient.   Vancomycin Other (See Comments)    Red man syndrome     Physical Exam: BP 110/80 (BP Location: Right Arm, Patient Position: Sitting, Cuff Size: Normal)    Pulse 94    Temp (!) 97.1 F (36.2 C) (Temporal)    Resp 16    Ht 5\' 3"  (1.6 m)    Wt 181 lb 6.4 oz (82.3 kg)    SpO2 95%    BMI 32.13 kg/m    Physical Exam Vitals signs reviewed.  Constitutional:      Appearance: Normal appearance.  HENT:  Head: Normocephalic and atraumatic.     Right Ear: Tympanic membrane normal.     Left Ear: Tympanic membrane normal.     Nose:     Comments: Bilateral nares slightly erythematous with clear nasal drainage noted. Pharynx normal. Ears normal. Eyes normal.    Mouth/Throat:     Pharynx: Oropharynx is clear.  Eyes:     Conjunctiva/sclera: Conjunctivae normal.  Neck:     Musculoskeletal: Normal range of motion and neck supple.  Cardiovascular:     Rate and Rhythm: Normal rate and regular rhythm.     Heart sounds: Normal heart sounds. No murmur.  Pulmonary:     Effort: Pulmonary  effort is normal.     Breath sounds: Normal breath sounds.     Comments: Lungs clear to auscultation Musculoskeletal: Normal range of motion.  Skin:    General: Skin is warm and dry.  Neurological:     Mental Status: She is alert and oriented to person, place, and time.  Psychiatric:        Mood and Affect: Mood normal.        Behavior: Behavior normal.        Thought Content: Thought content normal.        Judgment: Judgment normal.    Diagnostics: FVC 2.64, FEV1 2.34. Predicted FVC 3.65, predicted FEV2 3.08. Spirometry indicates normal ventilatory function.   Assessment and Plan: 1. Mild persistent asthma without complication   2. Seasonal and perennial allergic rhinitis   3. Seasonal allergic conjunctivitis   4. Acute sinusitis, recurrence not specified, unspecified location   5. Adverse food reaction, sequela   6. Gastroesophageal reflux disease, unspecified whether esophagitis present     Meds ordered this encounter  Medications   fluticasone (FLONASE) 50 MCG/ACT nasal spray    Sig: 2 sprays per nostril daily as needed for stuffy nose.    Dispense:  16 g    Refill:  5   cetirizine (ZYRTEC) 10 MG tablet    Sig: Take 1 tablet (10 mg total) by mouth daily.    Dispense:  34 tablet    Refill:  5    Please keep rx on file. Pt. Will call when needed.   montelukast (SINGULAIR) 10 MG tablet    Sig: Take 1 tablet (10 mg total) by mouth at bedtime. To prevent coughing or wheezing    Dispense:  34 tablet    Refill:  5    Patient Instructions  Allergic rhinitis Continue Zyrtec 10 mg once a day if needed for runny nose or itchy eyes.  Fluticasone 1 spray per nostril twice a day if needed for stuffy nose. This may help your ears  Continue saline nasal spray prior to medicated nasal sprays Astelin nasal spray 2 sprays in each nostril twice a day as needed for runny nose or sneezing Continue environmental control of pollens dust mite and mold  Consider allergen immunotherapy  if medications are not effective in controlling your symptoms  Acute sinusitis Begin the antibiotic prescribed by your ENT along with a probiotic as recommended by your gastroenterologist. If you have any gastrointestinal issues call your gastroenterologist right away Continue treatment plan for allergic rhinitis  Allergic conjunctivitis Continue a daily lubricating eye drop such as Natural Tears [place]azeo eye drop one drop in each eye once a day as needed for itchy red eyes  Asthma Continue montelukast  10 mg once a day for coughing or wheezing ProAir 2 puffs every 4 hours if needed for  wheezing or coughing spells For asthma flares, begin Flovent 110-2 puffs once a day with a spacer for 1-2 weeks or until you are cough and wheeze free  Reflux Continue omeprazole 20 mg twice a day to control reflux Follow up with your Gastroenterologist as you normally do  Adverse food reaction Continue to avoid banana. In case of an allergic reaction, take Benadryl 50 mg every 4 hours, and if life-threatening symptoms occur, inject with EpiPen 0.3 mg.  Continue other medications as noted in your chart.  Call me if this treatment plan is not working well for you  Follow up in 3 months or sooner as needed.   Return in about 3 months (around 02/22/2019), or if symptoms worsen or fail to improve.    Thank you for the opportunity to care for this patient.  Please do not hesitate to contact me with questions.  Gareth Morgan, FNP [facility] [place]  ________________________________________________  I have provided oversight concerning Tracey Perkins Amb's evaluation and treatment of this patient's health issues addressed during today's encounter.  I agree with the assessment and therapeutic plan as outlined in the note.   Signed,   R Edgar Frisk, MD

## 2018-11-22 NOTE — Patient Instructions (Addendum)
Allergic rhinitis Continue Zyrtec 10 mg once a day if needed for runny nose or itchy eyes.  Fluticasone 1 spray per nostril twice a day if needed for stuffy nose. This may help your ears  Continue saline nasal spray prior to medicated nasal sprays Astelin nasal spray 2 sprays in each nostril twice a day as needed for runny nose or sneezing Continue environmental control of pollens dust mite and mold  Consider allergen immunotherapy if medications are not effective in controlling your symptoms  Acute sinusitis Begin the antibiotic prescribed by your ENT along with a probiotic as recommended by your gastroenterologist. If you have any gastrointestinal issues call your gastroenterologist right away Continue treatment plan for allergic rhinitis  Allergic conjunctivitis Continue a daily lubricating eye drop such as Natural Tears [place]azeo eye drop one drop in each eye once a day as needed for itchy red eyes  Asthma Continue montelukast  10 mg once a day for coughing or wheezing ProAir 2 puffs every 4 hours if needed for wheezing or coughing spells For asthma flares, begin Flovent 110-2 puffs once a day with a spacer for 1-2 weeks or until you are cough and wheeze free  Reflux Continue omeprazole 20 mg twice a day to control reflux Follow up with your Gastroenterologist as you normally do  Adverse food reaction Continue to avoid banana. In case of an allergic reaction, take Benadryl 50 mg every 4 hours, and if life-threatening symptoms occur, inject with EpiPen 0.3 mg.  Continue other medications as noted in your chart.  Call me if this treatment plan is not working well for you  Follow up in 3 months or sooner as needed.

## 2019-02-28 ENCOUNTER — Ambulatory Visit: Payer: Medicaid Other | Admitting: Family Medicine

## 2019-03-07 ENCOUNTER — Ambulatory Visit: Payer: Medicaid Other | Admitting: Family Medicine

## 2019-03-30 ENCOUNTER — Other Ambulatory Visit: Payer: Self-pay | Admitting: Family Medicine

## 2019-04-01 NOTE — Telephone Encounter (Signed)
Pt needs ov for any additional refills. Sent 1 courtesy refill of proair hfa.

## 2019-05-18 DIAGNOSIS — F0781 Postconcussional syndrome: Secondary | ICD-10-CM

## 2019-05-18 HISTORY — DX: Postconcussional syndrome: F07.81

## 2019-06-14 DIAGNOSIS — R2689 Other abnormalities of gait and mobility: Secondary | ICD-10-CM | POA: Insufficient documentation

## 2019-06-14 MED ORDER — ASPIRIN 81 MG PO TBEC
81.00 | DELAYED_RELEASE_TABLET | ORAL | Status: DC
Start: 2019-06-15 — End: 2019-06-14

## 2019-06-14 MED ORDER — FLUTICASONE PROPIONATE 50 MCG/ACT NA SUSP
1.00 | NASAL | Status: DC
Start: 2019-06-15 — End: 2019-06-14

## 2019-06-14 MED ORDER — LEVOTHYROXINE SODIUM 88 MCG PO TABS
88.00 | ORAL_TABLET | ORAL | Status: DC
Start: 2019-06-15 — End: 2019-06-14

## 2019-06-14 MED ORDER — MECLIZINE HCL 12.5 MG PO TABS
12.50 | ORAL_TABLET | ORAL | Status: DC
Start: 2019-06-15 — End: 2019-06-14

## 2019-06-14 MED ORDER — DEXTROSE 50 % IV SOLN
12.50 | INTRAVENOUS | Status: DC
Start: ? — End: 2019-06-14

## 2019-06-14 MED ORDER — ENOXAPARIN SODIUM 40 MG/0.4ML ~~LOC~~ SOLN
40.00 | SUBCUTANEOUS | Status: DC
Start: 2019-06-15 — End: 2019-06-14

## 2019-06-14 MED ORDER — ALBUTEROL SULFATE HFA 108 (90 BASE) MCG/ACT IN AERS
2.00 | INHALATION_SPRAY | RESPIRATORY_TRACT | Status: DC
Start: ? — End: 2019-06-14

## 2019-06-14 MED ORDER — AZELASTINE HCL 0.1 % NA SOLN
1.00 | NASAL | Status: DC
Start: ? — End: 2019-06-14

## 2019-06-14 MED ORDER — GENERIC EXTERNAL MEDICATION
Status: DC
Start: ? — End: 2019-06-14

## 2019-06-14 MED ORDER — ATORVASTATIN CALCIUM 20 MG PO TABS
40.00 | ORAL_TABLET | ORAL | Status: DC
Start: 2019-06-15 — End: 2019-06-14

## 2019-06-14 MED ORDER — LUBIPROSTONE 8 MCG PO CAPS
16.00 | ORAL_CAPSULE | ORAL | Status: DC
Start: 2019-06-14 — End: 2019-06-14

## 2019-06-14 MED ORDER — LIDOCAINE HCL 1 % IJ SOLN
0.50 | INTRAMUSCULAR | Status: DC
Start: ? — End: 2019-06-14

## 2019-06-14 MED ORDER — GENERIC EXTERNAL MEDICATION
150.00 | Status: DC
Start: 2019-06-14 — End: 2019-06-14

## 2019-06-14 MED ORDER — MECLIZINE HCL 25 MG PO TABS
25.00 | ORAL_TABLET | ORAL | Status: DC
Start: 2019-06-14 — End: 2019-06-14

## 2019-06-14 MED ORDER — GABAPENTIN 300 MG PO CAPS
300.00 | ORAL_CAPSULE | ORAL | Status: DC
Start: 2019-06-14 — End: 2019-06-14

## 2019-06-14 MED ORDER — NORGESTIMATE-ETH ESTRADIOL 0.25-35 MG-MCG PO TABS
1.00 | ORAL_TABLET | ORAL | Status: DC
Start: 2019-06-15 — End: 2019-06-14

## 2019-06-14 MED ORDER — PANTOPRAZOLE SODIUM 40 MG PO TBEC
40.00 | DELAYED_RELEASE_TABLET | ORAL | Status: DC
Start: 2019-06-15 — End: 2019-06-14

## 2019-06-14 MED ORDER — LORATADINE 10 MG PO TABS
10.00 | ORAL_TABLET | ORAL | Status: DC
Start: 2019-06-15 — End: 2019-06-14

## 2019-06-14 MED ORDER — MAGNESIUM OXIDE 400 MG PO TABS
800.00 | ORAL_TABLET | ORAL | Status: DC
Start: 2019-06-14 — End: 2019-06-14

## 2019-06-14 MED ORDER — GLUCAGON (RDNA) 1 MG IJ KIT
1.00 | PACK | INTRAMUSCULAR | Status: DC
Start: ? — End: 2019-06-14

## 2019-06-14 MED ORDER — MONTELUKAST SODIUM 10 MG PO TABS
10.00 | ORAL_TABLET | ORAL | Status: DC
Start: 2019-06-14 — End: 2019-06-14

## 2019-06-14 MED ORDER — CALCIUM CARBONATE ANTACID 750 MG PO CHEW
CHEWABLE_TABLET | ORAL | Status: DC
Start: ? — End: 2019-06-14

## 2019-06-14 MED ORDER — FLUTICASONE PROPIONATE HFA 110 MCG/ACT IN AERO
1.00 | INHALATION_SPRAY | RESPIRATORY_TRACT | Status: DC
Start: 2019-06-15 — End: 2019-06-14

## 2019-06-19 DIAGNOSIS — G629 Polyneuropathy, unspecified: Secondary | ICD-10-CM | POA: Insufficient documentation

## 2019-07-03 ENCOUNTER — Ambulatory Visit: Payer: Medicaid Other | Admitting: Physical Therapy

## 2019-07-08 ENCOUNTER — Other Ambulatory Visit: Payer: Self-pay

## 2019-07-08 ENCOUNTER — Ambulatory Visit: Payer: Medicaid Other | Attending: Neurology | Admitting: Physical Therapy

## 2019-07-08 ENCOUNTER — Encounter: Payer: Self-pay | Admitting: Physical Therapy

## 2019-07-08 DIAGNOSIS — R296 Repeated falls: Secondary | ICD-10-CM | POA: Diagnosis not present

## 2019-07-08 DIAGNOSIS — M6281 Muscle weakness (generalized): Secondary | ICD-10-CM | POA: Diagnosis present

## 2019-07-08 DIAGNOSIS — R2681 Unsteadiness on feet: Secondary | ICD-10-CM | POA: Diagnosis present

## 2019-07-08 DIAGNOSIS — R2689 Other abnormalities of gait and mobility: Secondary | ICD-10-CM | POA: Diagnosis present

## 2019-07-08 DIAGNOSIS — R209 Unspecified disturbances of skin sensation: Secondary | ICD-10-CM | POA: Diagnosis present

## 2019-07-08 DIAGNOSIS — R42 Dizziness and giddiness: Secondary | ICD-10-CM | POA: Diagnosis present

## 2019-07-09 NOTE — Therapy (Signed)
[facility] [address] [place], [name], [address] Phone: [phone]   Fax:  [phone]  Physical Therapy Evaluation  Patient Details  Name: Tracey Perkins MRN: [id number] Date of Birth: 1985/08/20 Referring Provider (PT): Erin Sons, MD   Encounter Date: 07/08/2019   PT End of Session - 07/09/19 1018    Visit Number 1    Number of Visits 16    Authorization Type Medicaid - awaiting approval of first three visits    Authorization - Visit Number 0    Authorization - Number of Visits 3    PT Start Time 2426    PT Stop Time 1845    PT Time Calculation (min) 48 min    Activity Tolerance Patient tolerated treatment well    Behavior During Therapy [facility] for tasks assessed/performed;Anxious           Past Medical History:  Diagnosis Date  . Asthma   . Brain tumor ([place])   . Diabetes mellitus without complication ([place])   . Eczema   . Food allergy   . Gastroparesis   . Hypokalemia   . Hypomagnesemia   . Hypothyroid   . Neuropathy   . Thyroid disease   . Vision changes     Past Surgical History:  Procedure Laterality Date  . BRAIN SURGERY    . Portacath placement and removed    . WISDOM TOOTH EXTRACTION      There were no vitals filed for this visit.    Subjective Assessment - 07/08/19 1804    Subjective Pt recently discharged from [facility] inpatient rehab - per rehab notes pt has been experiencing decline in gait and balance for ~1 year and has had multiple falls.  Most recent fall was in a bathtub where she struck her head, negative LOC but had vertigo and difficulty walking.  Went to the hospital for assessment - was in the hospital x 1 week and then rehab for 2 weeks.  Has premorbid diplopia and nystagmus from previous brain tumor.  Vertigo is new since concussion but no emesis.  Normally pt wears hearing aides for hearing loss due radiation.  Pt now using RW but prior to fall she was independent.     Patient is accompained by: Family member    Pertinent History asthma, eczema, food allergy, polyneuropathy, uncontrolled type 1 diabetes, gastroparesis, h/o chronic L thalamic infarct, new R thalamic infarct since 2016, lymphocytic thyroiditis, s/p radiation therapy, chemo and 2 surgical resection for medulloblastoma, childhood, nystagmus, visual field defect, sensorineural hearing loss of bilat ears    Limitations Standing;Walking    Diagnostic tests MRI no new processes but did show new R thalamic infarct, new since 2016    Patient Stated Goals Getting back to walking independently and improve balance.  Going back to the gym and use the treadmill.    Currently in Pain? No/denies              [facility] PT Assessment - 07/08/19 1810      Assessment   Medical Diagnosis Disequilibrium, Traumatic injury of head, multiple falls, gait abnormality    Referring Provider (PT) Erin Sons, MD    Onset Date/Surgical Date 06/07/19    Hand Dominance Right    Prior Therapy yes - acute and inpatient rehab at [facility]      Precautions   Precautions Other (comment)    Precaution Comments asthma, eczema, food allergy, polyneuropathy, uncontrolled type 1 diabetes, gastroparesis, h/o chronic L thalamic  infarct, new R thalamic infarct since 2016, lymphocytic thyroiditis, s/p radiation therapy, chemo and 2 surgical resection for medulloblastoma, childhood, nystagmus, visual field defect, sensorineural hearing loss of bilat ears      Restrictions   Weight Bearing Restrictions No      Balance Screen   Has the patient fallen in the past 6 months Yes    How many times? 1   pt reports 1, [facility] notes report multiple   Has the patient had a decrease in activity level because of a fear of falling?  Yes      [place] residence    Living Arrangements Parent    Type of [place] to enter    Entrance Stairs-Number of Steps 4 + [place] Two level;Bed/bath upstairs    Standard - 2 wheels;Tub bench      Prior Function   Level of Independence Independent    Vocation Student    Vocation Requirements Is in the [employer] program    Leisure going to the gym      Cognition   Overall Cognitive Status History of cognitive impairments - at baseline      Observation/Other Assessments   Focus on Therapeutic Outcomes (FOTO)  Not applicable - Medicaid      Sensation   Light Touch Impaired by gross assessment    Additional Comments neuropathy in bilat feet, tingling and decreased to light touch      Coordination   Gross Motor Movements are Fluid and Coordinated No    Coordination and Movement Description 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  RLE: 4/5 hip flexion, 4+/5 knee extension and flexion, 4-/5 ankle DF.  LLE: 3/5 hip flexion, 4-/5 knee flexion/extension, 3+/5 ankle DF      Bed Mobility   Bed Mobility Rolling Right;Rolling Left;Supine to Sit;Sit to Supine    Rolling Right Independent    Rolling Left Independent    Supine to Sit Independent    Sit to Supine Independent      Transfers   Transfers Sit to Stand;Stand to Sit;Stand Pivot Transfers    Sit to Stand 5: Supervision    Sit to Stand Details (indicate cue type and reason) due to disequilibrium and posterior LOB    Stand to Sit 5: Supervision    Stand Pivot Transfers 5: Supervision    Stand Pivot Transfer Details (indicate cue type and reason) with RW      Ambulation/Gait   Ambulation/Gait Yes    Ambulation/Gait Assistance 5: Supervision    Ambulation/Gait Assistance Details supervision with RW, min A after performing  vestibular assessment due to disequilibrium    Ambulation Distance (Feet) 50 Feet    Assistive device Rolling walker    Gait Pattern Step-through pattern;Decreased step length - right;Decreased step length - left;Decreased stride length;Decreased dorsiflexion - right;Decreased dorsiflexion - left;Right foot flat;Left foot flat;Decreased trunk rotation;Wide base of support;Poor foot clearance - left;Poor foot clearance - right    Ambulation  Surface Level;Indoor      Standardized Balance Assessment   Standardized Balance Assessment Five Times Sit to Stand    Five times sit to stand comments  13.7 from mat without use of UE, mild dizziness and slight posterior LOB, min A                  Vestibular Assessment - 07/08/19 1828      Symptom Behavior   Subjective history of current problem no headaches over the past two days; when headaches start they start posteriorly    Type of Dizziness  Imbalance;Vertigo    Frequency of Dizziness was daily but recently hasn't had dizziness in the past two days    Duration of Dizziness hour    Symptom Nature Motion provoked    Aggravating Factors Activity in general;Turning head quickly    Relieving Factors Lying supine    Progression of Symptoms Better      Oculomotor Exam   Oculomotor Alignment Abnormal   premorbid   Spontaneous Absent    Gaze-induced  Left beating nystagmus with L gaze    Smooth Pursuits Saccades    Saccades Dysmetria;Hypermetric;Overshoots      Oculomotor Exam-Fixation Suppressed    Left Head Impulse difficult to assess due to premorbid impairments    Right Head Impulse difficult to assess due to premorbid impairments; caused dizziness and posterior headache      Vestibulo-Ocular Reflex   VOR to Slow Head Movement Positive bilaterally    VOR Cancellation Corrective saccades   dizziness   Comment dizziness with VOR cancellation      Positional Testing   Dix-Hallpike Dix-Hallpike Right;Dix-Hallpike Left    Horizontal  Canal Testing Horizontal Canal Right;Horizontal Canal Left      Dix-Hallpike Right   Dix-Hallpike Right Duration 30 seconds    Dix-Hallpike Right Symptoms Right nystagmus      Dix-Hallpike Left   Dix-Hallpike Left Duration 0    Dix-Hallpike Left Symptoms No nystagmus      Horizontal Canal Right   Horizontal Canal Right Duration 30 seconds    Horizontal Canal Right Symptoms Nystagmus   R rotary     Horizontal Canal Left   Horizontal Canal Left Duration 0    Horizontal Canal Left Symptoms Normal      Positional Sensitivities   Sit to Supine No dizziness    Supine to Left Side No dizziness    Supine to Right Side Mild dizziness    Supine to Sitting Mild dizziness    Right Hallpike Mild dizziness    Up from Right Hallpike Mild dizziness    Up from Left Hallpike No dizziness    Rolling Right Mild dizziness    Rolling Left No dizziness              Objective measurements completed on examination: See above findings.               PT Education - 07/09/19 1018    Education Details clinical findings, PT POC and goals, Medicaid approval of visits    Person(s) Educated Patient;Parent(s)    Methods Explanation    Comprehension Verbalized understanding            PT Short Term Goals - 07/09/19 1030      PT SHORT TERM GOAL #1   Title Pt will participate in further balance and gait assessments to further assess falls risk    Baseline not assessed to date    Time 4  Period Weeks    Status New      PT SHORT TERM GOAL #2   Title Pt be able to perform HEP with supervision focusing on strength, standing balance and vestibular impairments    Baseline Total A    Time 4    Period Weeks    Status New      PT SHORT TERM GOAL #3   Title Pt will demonstrate ability to perform five time sit to stand in </= 13 seconds without use of UE and without any LOB posteriorly    Baseline 13.7 with min A due to posterior LOB    Time 4    Period Weeks    Status New      PT  SHORT TERM GOAL #4   Title Pt will demonstrate ability to perform stand pivot transfers consistently to L and R with supervision without RW    Baseline Supervision with rolling walker    Time 4    Period Weeks    Status New      PT SHORT TERM GOAL #5   Title Pt will demonstrate ability to stand safely >5 minutes without UE support while performing dynamic UE movements and head movements MOD I to decrease falls risk when performing ADL    Baseline requires supervision-min A to prevent loss of balance    Time 4    Period Weeks    Status New             PT Long Term Goals - 07/09/19 1037      PT LONG TERM GOAL #1   Title Patient will demonstrate ability to perform final HEP independently, walk on treadmill at home with supervision and return to the gym for ongoing wellness    Baseline total A, not using treadmill, not going to the gym    Time 12    Period Weeks    Status New      PT LONG TERM GOAL #2   Title Pt will improve FGA with LRAD by 4 points to indicate decreased falls risk    Baseline not assessed to date    Time 12    Period Weeks    Status New      PT LONG TERM GOAL #3   Title Pt will demonstrate ability to perform each condition on MCTSIB for >15 seconds to indicate improved sensory integration    Baseline Not assessed to date    Time 12    Period Weeks    Status New      PT LONG TERM GOAL #4   Title Pt will report no dizziness or headaches with bed mobility, transfers, head/body turns, bending down to the floor or looking up at the ceiling to decrease falls risk with ADL and mobility    Baseline mild dizziness with bed mobility, transfers and head/body turns during gait    Time 12    Period Weeks    Status New      PT LONG TERM GOAL #5   Title Pt will report ability to ambulate in her home without AD and perform ADL MOD I without parent's supervision for safety    Baseline ambulating with rolling walker supervision; supervision for ADL due to falls risk      Time 12    Period Weeks    Status New                  Plan - 07/09/19 1020  Clinical Impression Statement Pt is a 34 year old female referred to outpatient neurorehabilitation for evaluation s/p fall with traumatic head injury, disequilibrium and gait abnormality.  Pt's PMH is significant for the following: asthma, eczema, food allergy, polyneuropathy, uncontrolled type 1 diabetes, gastroparesis, h/o chronic L thalamic infarct, new R thalamic infarct since 2016, lymphocytic thyroiditis, s/p radiation therapy, chemo and 2 surgical resection for medulloblastoma, childhood, nystagmus, visual field defect, sensorineural hearing loss of bilat ears.  PT evaluation demonstrated the following impairments: impaired strength L > R weakness, impaired sensation, impaired coordination, vertigo and motion sensitivity, headaches, fatigue, impaired balance and sensory integration, and abnormal gait placing patient at increased risk for falls and limits patient's ability to perform ADL independently and attend Master's program.  Pt would benefit from skilled PT services to address these impairments, to maximize functional mobility independence and decrease falls risk.    Personal Factors and Comorbidities Comorbidity 3+;Finances;Fitness;Past/Current Experience    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, childhood, nystagmus, visual field defect, sensorineural hearing loss of bilat ears    Examination-Activity Limitations Bathing;Bend;Dressing;Locomotion Level;Stairs;Stand;Reach Overhead    Examination-Participation Restrictions Community Activity;School    Stability/Clinical Decision Making Evolving/Moderate complexity    Clinical Decision Making Moderate    Rehab Potential Good     PT Frequency Other (comment)   1x/week x 3 then 2x/week x 6   PT Duration 12 weeks    PT Treatment/Interventions ADLs/Self Care Home Management;Aquatic Therapy;Canalith Repostioning;DME Instruction;Gait training;Stair training;Functional mobility training;Patient/family education;Therapeutic activities;Orthotic Fit/Training;Therapeutic exercise;Balance training;Neuromuscular re-education;Vestibular    PT Next Visit Plan recheck and treat positional vertigo as needed.  Finish MSQ.  Assess MCTSIB and FGA - reset baselines.  Initiate HEP    Consulted and Agree with Plan of Care Patient;Family member/caregiver    Family Member Consulted parents           Patient will benefit from skilled therapeutic intervention in order to improve the following deficits and impairments:  Abnormal gait, Decreased coordination, Difficulty walking, Dizziness, Decreased activity tolerance, Decreased balance, Decreased strength, Impaired sensation, Pain  Visit Diagnosis: Repeated falls  Dizziness and giddiness  Unsteadiness on feet  Muscle weakness (generalized)  Other abnormalities of gait and mobility  Unspecified disturbances of skin sensation     Problem List Patient Active Problem List   Diagnosis Date Noted  . 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  . 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 Status post radiation therapy 11/22/2018  . Seasonal and perennial allergic rhinitis 11/22/2018  . Seasonal allergic conjunctivitis 11/22/2018  . Acute sinusitis 11/22/2018  . Keratosis pilaris 10/05/2017  . Melanocytic nevi of trunk 10/05/2017  . Adverse food reaction 02/09/2017  . Gastroesophageal reflux disease 02/09/2017  . Mild intermittent asthma with acute exacerbation 11/19/2015  . Mild persistent asthma without complication 09/98/3382  . Acute seasonal allergic rhinitis due to pollen 11/16/2015  . Anaphylactic shock due to adverse food reaction 11/16/2015  . Medulloblastoma ([place])  11/16/2015  . Type 1 diabetes mellitus without complication ([place]) 50/53/9767  . Dry mouth 11/16/2015  . Xerosis cutis 11/16/2015  . Nystagmus 08/15/2014  . Visual field defect 08/15/2014  . Disequilibrium 07/25/2014  . 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    Rico Junker, PT, DPT 07/09/19    11:23 AM    [facility] [address]  [place], [name], [address] Phone: [phone]   Fax:  [phone]  Name: Tracey Perkins MRN: [id number] Date of Birth: [birth year]

## 2019-07-18 ENCOUNTER — Encounter: Payer: Self-pay | Admitting: Physical Therapy

## 2019-07-18 ENCOUNTER — Ambulatory Visit: Payer: Medicaid Other | Attending: Neurology | Admitting: Physical Therapy

## 2019-07-18 ENCOUNTER — Other Ambulatory Visit: Payer: Self-pay

## 2019-07-18 DIAGNOSIS — R2689 Other abnormalities of gait and mobility: Secondary | ICD-10-CM | POA: Diagnosis present

## 2019-07-18 DIAGNOSIS — R296 Repeated falls: Secondary | ICD-10-CM | POA: Diagnosis not present

## 2019-07-18 DIAGNOSIS — R42 Dizziness and giddiness: Secondary | ICD-10-CM | POA: Diagnosis present

## 2019-07-18 DIAGNOSIS — M6281 Muscle weakness (generalized): Secondary | ICD-10-CM | POA: Diagnosis present

## 2019-07-18 DIAGNOSIS — R209 Unspecified disturbances of skin sensation: Secondary | ICD-10-CM | POA: Insufficient documentation

## 2019-07-18 DIAGNOSIS — R2681 Unsteadiness on feet: Secondary | ICD-10-CM

## 2019-07-18 NOTE — Therapy (Signed)
[facility] [address] [place], [name], [address] Phone: [phone]   Fax:  [phone]  Physical Therapy Treatment  Patient Details  Name: Tracey Perkins MRN: [id number] Date of Birth: 1985/10/05 Referring Provider (PT): Erin Sons, MD   Encounter Date: 07/18/2019   PT End of Session - 07/18/19 1631    Visit Number 2    Number of Visits 16    Date for PT Re-Evaluation 08/07/19    Authorization Type Medicaid - approved 3 visits from 07/18/19 - 08/07/19    Authorization - Visit Number 1    Authorization - Number of Visits 3    PT Start Time 6269    PT Stop Time 1620    PT Time Calculation (min) 50 min    Activity Tolerance Patient tolerated treatment well    Behavior During Therapy [facility] for tasks assessed/performed           Past Medical History:  Diagnosis Date  . Asthma   . Brain tumor ([place])   . Diabetes mellitus without complication ([place])   . Eczema   . Food allergy   . Gastroparesis   . Hypokalemia   . Hypomagnesemia   . Hypothyroid   . Neuropathy   . Thyroid disease   . Vision changes     Past Surgical History:  Procedure Laterality Date  . BRAIN SURGERY    . Portacath placement and removed    . WISDOM TOOTH EXTRACTION      There were no vitals filed for this visit.   Subjective Assessment - 07/18/19 1545    Subjective No falls, walking some without the walker but still uses it when going out of the house/in community with boyfriend.  Still having some dizziness with head turns and intermittent episodes of not being able to feel her foot when anxious.    Patient is accompained by: Family member    Pertinent History asthma, eczema, food allergy, polyneuropathy, uncontrolled type 1 diabetes, gastroparesis, h/o chronic L thalamic infarct, new R thalamic infarct since 2016, lymphocytic thyroiditis, s/p radiation therapy, chemo and 2 surgical resection for medulloblastoma, childhood,  nystagmus, visual field defect, sensorineural hearing loss of bilat ears    Limitations Standing;Walking    Diagnostic tests MRI no new processes but did show new R thalamic infarct, new since 2016    Patient Stated Goals Getting back to walking independently and improve balance.  Going back to the gym and use the treadmill.    Currently in Pain? No/denies              [facility] PT Assessment - 07/18/19 1621      Ambulation/Gait   Ambulation/Gait Yes    Ambulation/Gait Assistance 4: Min assist    Ambulation/Gait Assistance Details without RW today    Ambulation Distance (Feet) 100 Feet    Assistive device None    Gait Pattern Decreased step length - right;Decreased step length - left;Decreased stride length;Decreased trunk rotation;Wide base of support    Ambulation Surface Level;Indoor    Stairs Yes    Stairs Assistance 4: Min assist    Stairs Assistance Details (indicate cue type and reason) alternating to ascend, step to to descend    Stair Management Technique Two rails;Alternating pattern;Step to pattern;Forwards    Number of Stairs 4    Height of Stairs 6      Standardized Balance Assessment   Standardized Balance Assessment Dynamic Gait Index      Dynamic Gait  Index   Level Surface Moderate Impairment    Change in Gait Speed Moderate Impairment    Gait with Horizontal Head Turns Severe Impairment    Gait with Vertical Head Turns Severe Impairment    Gait and Pivot Turn Moderate Impairment    Step Over Obstacle Mild Impairment    Step Around Obstacles Mild Impairment    Steps Moderate Impairment    Total Score 8    DGI comment: 8/24      Functional Gait  Assessment   Gait assessed  --               Vestibular Assessment - 07/18/19 1547      Positional Testing   Dix-Hallpike Dix-Hallpike Right;Dix-Hallpike Left    Horizontal Canal Testing Horizontal Canal Right;Horizontal Canal Left      Dix-Hallpike Right   Dix-Hallpike Right Duration 0    Dix-Hallpike  Right Symptoms No nystagmus      Dix-Hallpike Left   Dix-Hallpike Left Duration >30 seconds    Dix-Hallpike Left Symptoms Upbeat, left rotatory nystagmus      Horizontal Canal Right   Horizontal Canal Right Duration 5 seconds    Horizontal Canal Right Symptoms Ageotrophic   L beating, no R rotary     Horizontal Canal Left   Horizontal Canal Left Duration 0    Horizontal Canal Left Symptoms Normal      Environmental education officer Comment M-CTSIB: 8 and 20 seconds Condition 1, 14 seconds condition 2;       Positional Sensitivities   Sit to Supine No dizziness    Supine to Left Side No dizziness    Supine to Right Side Mild dizziness    Supine to Sitting Moderate dizziness    Right Hallpike Mild dizziness    Up from Right Hallpike Moderate dizziness    Up from Left Hallpike Moderate dizziness    Nose to Right Knee Mild dizziness    Right Knee to Sitting Mild dizziness    Nose to Left Knee Mild dizziness    Left Knee to Sitting Mild dizziness    Head Turning x 5 Mild dizziness    Head Nodding x 5 Mild dizziness    Pivot Right in Standing Mild dizziness    Pivot Left in Standing Mild dizziness    Rolling Right Mild dizziness    Rolling Left No dizziness                            PT Education - 07/18/19 1630    Education Details medicaid visits and need to schedule; balance and gait findings, continue to use RW outside the home, can ambulate short distances in well lit areas in the home if floor is clear of obstacles without RW    Person(s) Educated Patient;Parent(s)    Methods Explanation    Comprehension Verbalized understanding            PT Short Term Goals - 07/18/19 1638      PT SHORT TERM GOAL #1   Title Pt will participate in further balance and gait assessments to further assess falls risk    Baseline assessed DGI and MCTSIB    Time 4    Period Weeks    Status Achieved      PT SHORT TERM GOAL #2   Title Pt be able to perform HEP with  supervision focusing on strength, standing balance and vestibular impairments  Baseline Total A    Time 4    Period Weeks    Status New    Target Date 08/07/19      PT SHORT TERM GOAL #3   Title Pt will demonstrate ability to perform five time sit to stand in </= 13 seconds without use of UE and without any LOB posteriorly    Baseline 13.7 with min A due to posterior LOB    Time 4    Period Weeks    Status New    Target Date 08/07/19      PT SHORT TERM GOAL #4   Title Pt will demonstrate ability to perform stand pivot transfers consistently to L and R with supervision without RW    Baseline Supervision with rolling walker    Time 4    Period Weeks    Status New    Target Date 08/07/19      PT SHORT TERM GOAL #5   Title Pt will demonstrate ability to stand safely >5 minutes without UE support while performing dynamic UE movements and head movements MOD I to decrease falls risk when performing ADL    Baseline requires supervision-min A to prevent loss of balance    Time 4    Period Weeks    Status New    Target Date 08/07/19             PT Long Term Goals - 07/18/19 1640      PT LONG TERM GOAL #1   Title Patient will demonstrate ability to perform final HEP independently, walk on treadmill at home with supervision and return to the gym for ongoing wellness    Baseline total A, not using treadmill, not going to the gym    Time 12    Period Weeks    Status New      PT LONG TERM GOAL #2   Title Pt will improve DGI without AD by 4 points to indicate decreased falls risk    Baseline 8/24 without AD, with min A    Time 12    Period Weeks    Status New      PT LONG TERM GOAL #3   Title Pt will demonstrate ability to perform each condition on MCTSIB for >15 seconds to indicate improved sensory integration    Baseline 20 seconds first, 14 seconds second, not able to perform 3/4.    Time 12    Period Weeks    Status Revised      PT LONG TERM GOAL #4   Title Pt will  report no dizziness or headaches with bed mobility, transfers, head/body turns, bending down to the floor or looking up at the ceiling to decrease falls risk with ADL and mobility    Baseline mild dizziness with bed mobility, transfers and head/body turns during gait    Time 12    Period Weeks    Status New      PT LONG TERM GOAL #5   Title Pt will report ability to ambulate in her home without AD and perform ADL MOD I without parent's supervision for safety    Baseline ambulating with rolling walker supervision; supervision for ADL due to falls risk    Time 12    Period Weeks    Status New                 Plan - 07/18/19 1632    Clinical Impression Statement Pt ambulating without RW today.  When ambulating in a straight line on level surfaces pt is safe to ambulate without AD; with more dynamic challenges pt requires UE support or min A due to dizziness and imbalance.  Continued assessment of dizziness, sensory integration and falls risk during gait.  Pt presents with L beating nystagmus rolling to L and to R and during L hallpike-dix and may indicate L BPPV or R hypofunction - will continue to assess.  Pt continues to present with motion sensitivity and poor sensory integration - pt only able to complete first two conditions of M-CTSIB but requires multiple tries; unable to maintain balance on compliant foam.  Pt does demonstrate high falls risk as indicated by DGI. Will initiate HEP next session.    Personal Factors and Comorbidities Comorbidity 3+;Finances;Fitness;Past/Current Experience    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, childhood, nystagmus, visual field defect, sensorineural hearing loss of bilat ears    Examination-Activity Limitations  Bathing;Bend;Dressing;Locomotion Level;Stairs;Stand;Reach Overhead    Examination-Participation Restrictions Community Activity;School    Stability/Clinical Decision Making Evolving/Moderate complexity    Rehab Potential Good    PT Frequency Other (comment)   1x/week x 3 then 2x/week x 6   PT Duration 12 weeks    PT Treatment/Interventions ADLs/Self Care Home Management;Aquatic Therapy;Canalith Repostioning;DME Instruction;Gait training;Stair training;Functional mobility training;Patient/family education;Therapeutic activities;Orthotic Fit/Training;Therapeutic exercise;Balance training;Neuromuscular re-education;Vestibular    PT Next Visit Plan Vestibular - L BPPV vs. R Hypofunction?  Initiate HEP - gaze adaptation, habituation, balance with narrow BOS    Consulted and Agree with Plan of Care Patient;Family member/caregiver    Family Member Consulted parents           Patient will benefit from skilled therapeutic intervention in order to improve the following deficits and impairments:  Abnormal gait, Decreased coordination, Difficulty walking, Dizziness, Decreased activity tolerance, Decreased balance, Decreased strength, Impaired sensation, Pain  Visit Diagnosis: Repeated falls  Dizziness and giddiness  Unsteadiness on feet  Muscle weakness (generalized)  Other abnormalities of gait and mobility     Problem List Patient Active Problem List   Diagnosis Date Noted  . 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  . 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 Status post radiation therapy 11/22/2018  . Seasonal and perennial allergic rhinitis 11/22/2018  . Seasonal allergic conjunctivitis 11/22/2018  . Acute sinusitis 11/22/2018  . Keratosis pilaris 10/05/2017  . Melanocytic nevi of trunk 10/05/2017  . Adverse food reaction 02/09/2017  . Gastroesophageal reflux disease 02/09/2017  . Mild intermittent asthma with acute exacerbation 11/19/2015  . Mild persistent asthma without  complication 01/25/3233  . Acute seasonal allergic rhinitis due to pollen 11/16/2015  . Anaphylactic shock due to adverse food reaction 11/16/2015  . Medulloblastoma ([employer]) 11/16/2015  . Type 1 diabetes mellitus without complication ([place]) 57/32/2025  . Dry mouth 11/16/2015  . Xerosis cutis 11/16/2015  . Nystagmus 08/15/2014  . Visual field defect 08/15/2014  . Disequilibrium 07/25/2014  . 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   Rico Junker, PT, DPT 07/18/19    4:44 PM    [facility] [address] [place], [name], [address] Phone: [phone]   Fax:  [phone]  Name: DARLETTA NOBLETT MRN: [id number] Date of Birth: January 23, 1985

## 2019-07-26 ENCOUNTER — Other Ambulatory Visit: Payer: Self-pay

## 2019-07-26 ENCOUNTER — Ambulatory Visit: Payer: Medicaid Other | Admitting: Physical Therapy

## 2019-07-26 ENCOUNTER — Encounter: Payer: Self-pay | Admitting: Physical Therapy

## 2019-07-26 DIAGNOSIS — R2681 Unsteadiness on feet: Secondary | ICD-10-CM

## 2019-07-26 DIAGNOSIS — R42 Dizziness and giddiness: Secondary | ICD-10-CM

## 2019-07-26 DIAGNOSIS — M6281 Muscle weakness (generalized): Secondary | ICD-10-CM

## 2019-07-26 DIAGNOSIS — R209 Unspecified disturbances of skin sensation: Secondary | ICD-10-CM

## 2019-07-26 DIAGNOSIS — R296 Repeated falls: Secondary | ICD-10-CM | POA: Diagnosis not present

## 2019-07-26 DIAGNOSIS — R2689 Other abnormalities of gait and mobility: Secondary | ICD-10-CM

## 2019-07-26 NOTE — Patient Instructions (Signed)
Gaze Stabilization: Sitting    Keeping eyes on target on wall 3 feet away, and move head side to side slowly for 8-10 repetitions. Do __2-3__ sessions per day.   Rotation: Isometric (Supine / Sitting)    Position Helper: Place hands firmly on both sides of head. Motion -Cue patient to exhale while turning head to left -Helper blocks movement. CAUTION: Use gentle pressure to avoid neck strain. Hold __5_ seconds. Relax. Repeat _8__ times. Repeat in other direction 8 times.    Stretch Break - Chin Tuck    Lying on your back, head on a pillow, keep chin slightly tucked back and press back of the head into the pillow and hold __5__ seconds. Relax and return to starting position. Repeat __10__ times..   Feet Together, Head Motion - Eyes Open    With eyes open, feet together, move head slowly: up and down 5 repetitions, Perform head turns to the left and right 5 repetitions.. Repeat __2__ times per session.

## 2019-07-26 NOTE — Therapy (Addendum)
[facility] [address] [place], [name], [address] Phone: [phone]   Fax:  [phone]  Physical Therapy Treatment  Patient Details  Name: Tracey Perkins MRN: [id number] Date of Birth: 08/08/1985 Referring Provider (PT): Erin Sons, MD   Encounter Date: 07/26/2019   PT End of Session - 07/26/19 2157    Visit Number 3    Number of Visits 16    Date for PT Re-Evaluation 08/07/19    Authorization Type Medicaid - approved 3 visits from 07/18/19 - 08/07/19    Authorization - Visit Number 2    Authorization - Number of Visits 3    PT Start Time 7035    PT Stop Time 1545    PT Time Calculation (min) 49 min    Activity Tolerance Patient tolerated treatment well    Behavior During Therapy [facility] for tasks assessed/performed           Past Medical History:  Diagnosis Date  . Asthma   . Brain tumor ([place])   . Diabetes mellitus without complication ([place])   . Eczema   . Food allergy   . Gastroparesis   . Hypokalemia   . Hypomagnesemia   . Hypothyroid   . Neuropathy   . Thyroid disease   . Vision changes     Past Surgical History:  Procedure Laterality Date  . BRAIN SURGERY    . Portacath placement and removed    . WISDOM TOOTH EXTRACTION      There were no vitals filed for this visit.   Subjective Assessment - 07/26/19 1504    Subjective Was a little woozy after last session for the rest of the day but overall dizziness is getting better.  Has been going out with her boyfriend and using the walker; going out to restaurants.    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  Going back to the gym and use the treadmill.    Currently in Pain? No/denies                   Vestibular Assessment - 07/26/19 1507      Positional Testing   Dix-Hallpike Dix-Hallpike Left    Horizontal Canal Testing Horizontal Canal Right;Horizontal Canal Left      Dix-Hallpike Left   Dix-Hallpike Left Duration 0    Dix-Hallpike Left Symptoms No nystagmus      Horizontal Canal Right   Horizontal Canal Right Duration >30 seconds    Horizontal Canal Right Symptoms Ageotrophic      Horizontal Canal Left   Horizontal Canal Left Duration 30 seconds    Horizontal Canal Left Symptoms Geotrophic   head on neck more sx than full body roll                   [facility] Adult PT Treatment/Exercise - 07/27/19 0935      Exercises   Exercises Neck      Neck Exercises: Supine   Cervical  Isometrics Right rotation;Left rotation;5 secs;10 reps    Cervical Isometrics Limitations Supine on mat    Neck Retraction 10 reps;5 secs    Neck Retraction Limitations cues to keep chin neutral           Vestibular Treatment/Exercise - 07/26/19 1525      Vestibular Treatment/Exercise   Gaze Exercises X1 Viewing Horizontal;X1 Viewing Vertical      X1 Viewing Horizontal   Foot Position seated without back support    Reps 8    Comments 2 sets; mild symptoms      X1 Viewing Vertical   Foot Position seated without back support    Reps 2    Comments diplopia; did not add to HEP right now              Balance Exercises - 07/27/19 0936      Balance Exercises: Standing   Standing Eyes Opened Narrow base of support (BOS);Solid surface;5 reps   UE support on back of chair; head turns/nods            PT Education - 07/26/19 2156    Education  Details initiated HEP    Person(s) Educated Patient;Parent(s)    Methods Explanation;Demonstration;Handout    Comprehension Verbalized understanding;Returned demonstration         Gaze Stabilization: Sitting    Keeping eyes on target on wall 3 feet away, and move head side to side slowly for 8-10 repetitions. Do __2-3__ sessions per day.   Rotation: Isometric (Supine / Sitting)    Position Helper: Place hands firmly on both sides of head. Motion -Cue patient to exhale while turning head to left -Helper blocks movement. CAUTION: Use gentle pressure to avoid neck strain. Hold __5_ seconds. Relax. Repeat _8__ times. Repeat in other direction 8 times.    Stretch Break - Chin Tuck    Lying on your back, head on a pillow, keep chin slightly tucked back and press back of the head into the pillow and hold __5__ seconds. Relax and return to starting position. Repeat __10__ times..   Feet Together, Head Motion - Eyes Open    With eyes open, feet together, move head slowly: up and down 5 repetitions, Perform head turns to the left and right 5 repetitions.. Repeat __2__ times per session.         PT Short Term Goals - 07/18/19 1638      PT SHORT TERM GOAL #1   Title Pt will participate in further balance and gait assessments to further assess falls risk    Baseline assessed DGI and MCTSIB    Time 4    Period Weeks    Status Achieved      PT SHORT TERM GOAL #2   Title Pt be able to perform HEP with supervision focusing on strength, standing balance and vestibular impairments    Baseline Total A    Time 4    Period Weeks    Status New    Target Date 08/07/19      PT SHORT TERM GOAL #3   Title Pt will demonstrate ability to perform five time sit to stand in </= 13 seconds without use of UE and without any LOB posteriorly    Baseline 13.7 with min A due to posterior LOB    Time 4    Period Weeks    Status New    Target Date 08/07/19      PT SHORT TERM GOAL #4    Title  Pt will demonstrate ability to perform stand pivot transfers consistently to L and R with supervision without RW    Baseline Supervision with rolling walker    Time 4    Period Weeks    Status New    Target Date 08/07/19      PT SHORT TERM GOAL #5   Title Pt will demonstrate ability to stand safely >5 minutes without UE support while performing dynamic UE movements and head movements MOD I to decrease falls risk when performing ADL    Baseline requires supervision-min A to prevent loss of balance    Time 4    Period Weeks    Status New    Target Date 08/07/19             PT Long Term Goals - 07/18/19 1640      PT LONG TERM GOAL #1   Title Patient will demonstrate ability to perform final HEP independently, walk on treadmill at home with supervision and return to the gym for ongoing wellness    Baseline total A, not using treadmill, not going to the gym    Time 12    Period Weeks    Status New      PT LONG TERM GOAL #2   Title Pt will improve DGI without AD by 4 points to indicate decreased falls risk    Baseline 8/24 without AD, with min A    Time 12    Period Weeks    Status New      PT LONG TERM GOAL #3   Title Pt will demonstrate ability to perform each condition on MCTSIB for >15 seconds to indicate improved sensory integration    Baseline 20 seconds first, 14 seconds second, not able to perform 3/4.    Time 12    Period Weeks    Status Revised      PT LONG TERM GOAL #4   Title Pt will report no dizziness or headaches with bed mobility, transfers, head/body turns, bending down to the floor or looking up at the ceiling to decrease falls risk with ADL and mobility    Baseline mild dizziness with bed mobility, transfers and head/body turns during gait    Time 12    Period Weeks    Status New      PT LONG TERM GOAL #5   Title Pt will report ability to ambulate in her home without AD and perform ADL MOD I without parent's supervision for safety    Baseline  ambulating with rolling walker supervision; supervision for ADL due to falls risk    Time 12    Period Weeks    Status New                 Plan - 07/26/19 2158    Clinical Impression Statement Continued to assess vestibular function - pt's nystagmus is still not consistent with peripheral/positional vertigo and is more consistent with central pathology.  Pt also presents with head tilt to R - will assess subjective visual vertical and cervical proprioception next session.  Initiated HEP focusing on habituation/gaze stability and neck strengthening.  Will continue to asddress in order to progress towards LTG.    Personal Factors and Comorbidities Comorbidity 3+;Finances;Fitness;Past/Current Experience    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, childhood, nystagmus, visual  field defect, sensorineural hearing loss of bilat ears    Examination-Activity Limitations Bathing;Bend;Dressing;Locomotion Level;Stairs;Stand;Reach Overhead    Examination-Participation Restrictions Community Activity;School    Stability/Clinical Decision Making Evolving/Moderate complexity    Rehab Potential Good    PT Frequency Other (comment)   1x/week x 3 then 2x/week x 6   PT Duration 12 weeks    PT Treatment/Interventions ADLs/Self Care Home Management;Aquatic Therapy;Canalith Repostioning;DME Instruction;Gait training;Stair training;Functional mobility training;Patient/family education;Therapeutic activities;Orthotic Fit/Training;Therapeutic exercise;Balance training;Neuromuscular re-education;Vestibular    PT Next Visit Plan Check STG and send to me so I can submit to Medicaid for more visits.  They need to schedule more visits - 2x/week x 6!!   Continue to add to HEP to focus on gaze  stability, habituation, balance and coordination.  Neck proprioception(laser)    Consulted and Agree with Plan of Care Patient;Family member/caregiver    Family Member Consulted parents           Patient will benefit from skilled therapeutic intervention in order to improve the following deficits and impairments:  Abnormal gait, Decreased coordination, Difficulty walking, Dizziness, Decreased activity tolerance, Decreased balance, Decreased strength, Impaired sensation, Pain  Visit Diagnosis: Repeated falls  Dizziness and giddiness  Muscle weakness (generalized)  Unsteadiness on feet  Other abnormalities of gait and mobility  Unspecified disturbances of skin sensation     Problem List Patient Active Problem List   Diagnosis Date Noted  . 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  . 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 Status post radiation therapy 11/22/2018  . Seasonal and perennial allergic rhinitis 11/22/2018  . Seasonal allergic conjunctivitis 11/22/2018  . Acute sinusitis 11/22/2018  . Keratosis pilaris 10/05/2017  . Melanocytic nevi of trunk 10/05/2017  . Adverse food reaction 02/09/2017  . Gastroesophageal reflux disease 02/09/2017  . Mild intermittent asthma with acute exacerbation 11/19/2015  . Mild persistent asthma without complication 03/83/3383  . Acute seasonal allergic rhinitis due to pollen 11/16/2015  . Anaphylactic shock due to adverse food reaction 11/16/2015  . 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 Type 1 diabetes mellitus without complication ([place]) 29/19/1660  . Dry mouth 11/16/2015  . Xerosis cutis 11/16/2015  . Nystagmus 08/15/2014  . Visual field defect 08/15/2014  . Disequilibrium 07/25/2014  . 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    Rico Junker, PT, DPT 07/27/19    9:46 AM    [facility] [address] [place], [name], [address] Phone: [phone]   Fax:   [phone]  Name: MARLISS BUTTACAVOLI MRN: [id number] Date of Birth: [birth year]

## 2019-07-30 ENCOUNTER — Other Ambulatory Visit: Payer: Self-pay

## 2019-07-30 ENCOUNTER — Ambulatory Visit: Payer: Medicaid Other

## 2019-07-30 DIAGNOSIS — R296 Repeated falls: Secondary | ICD-10-CM

## 2019-07-30 DIAGNOSIS — R209 Unspecified disturbances of skin sensation: Secondary | ICD-10-CM

## 2019-07-30 DIAGNOSIS — R42 Dizziness and giddiness: Secondary | ICD-10-CM

## 2019-07-30 DIAGNOSIS — R2689 Other abnormalities of gait and mobility: Secondary | ICD-10-CM

## 2019-07-30 DIAGNOSIS — M6281 Muscle weakness (generalized): Secondary | ICD-10-CM

## 2019-07-30 DIAGNOSIS — R2681 Unsteadiness on feet: Secondary | ICD-10-CM

## 2019-07-30 NOTE — Therapy (Signed)
[facility] [address] [place], [name], [address] Phone: [phone]   Fax:  [phone]  Physical Therapy Treatment  Patient Details  Name: Tracey Perkins MRN: [id number] Date of Birth: 1985-06-30 Referring Provider (PT): Erin Sons, MD   Encounter Date: 07/30/2019   PT End of Session - 07/30/19 1538    Visit Number 4    Number of Visits 16    Date for PT Re-Evaluation 08/07/19    Authorization Type Medicaid - approved 3 visits from 07/18/19 - 08/07/19    Authorization - Visit Number 3    Authorization - Number of Visits 3    PT Start Time 1146    PT Stop Time 1231    PT Time Calculation (min) 45 min    Equipment Utilized During Treatment Other (comment)   min A to S prn   Activity Tolerance Patient tolerated treatment well    Behavior During Therapy [facility] for tasks assessed/performed           Past Medical History:  Diagnosis Date  . Asthma   . Brain tumor ([place])   . Diabetes mellitus without complication ([place])   . Eczema   . Food allergy   . Gastroparesis   . Hypokalemia   . Hypomagnesemia   . Hypothyroid   . Neuropathy   . Thyroid disease   . Vision changes     Past Surgical History:  Procedure Laterality Date  . BRAIN SURGERY    . Portacath placement and removed    . WISDOM TOOTH EXTRACTION      There were no vitals filed for this visit.   Subjective Assessment - 07/30/19 1152    Subjective Pt denied changes since last visit and reported a few stumbles and close calls but no falls since last visit. Pt reported she bought tap shoes to dance and to help L foot clearance. She holds onto someone while she taps, she did tap dancing three times last week.    Patient is accompained by: Family member    Pertinent History asthma, eczema, food allergy, polyneuropathy, uncontrolled type 1 diabetes, gastroparesis, h/o chronic L thalamic infarct, new R thalamic infarct since 2016, lymphocytic  thyroiditis, s/p radiation therapy, chemo and 2 surgical resection for medulloblastoma, childhood, nystagmus, visual field defect, sensorineural hearing loss of bilat ears    Patient Stated Goals Getting back to walking independently and improve balance.  Going back to the gym and use the treadmill.    Currently in Pain? No/denies           NMR: pt required cues and demo for proper technique during all activities. Pt's father assisted as needed. Performed either seated or in standing position with S to ensure safety.  Gaze Stabilization: Sitting    Keeping eyes on target on wall 3 feet away, and move head side to side slowly for 8-10 repetitions. Do __2-3__ sessions per day.   Rotation: Isometric (Supine / Sitting)    Position Helper: Place hands firmly on both sides of head. Motion -Cue patient to exhale while turning head to left -Helper blocks movement. CAUTION: Use gentle pressure to avoid neck strain. Hold __5_ seconds. Relax. Repeat _8__ times. Repeat in other direction 8 times.    Stretch Break - Chin Tuck    Lying on your back, head on a pillow, keep chin slightly tucked back and press back of the head into the pillow and hold __5__ seconds. Relax and return to starting position.  Repeat __10__ times..   Feet Together, Head Motion - Eyes Open    With eyes open, feet together, move head slowly: up and down 5 repetitions, Perform head turns to the left and right 5 repetitions.. Repeat __2__ times per session.                  [facility] Adult PT Treatment/Exercise - 07/30/19 1533      Transfers   Transfers Stand Pivot Transfers    Five time sit to stand comments  17.06 sec. with one LOB episode with min A to correct, 20.71 sec. with one LOB episode which pt self corrected, 11.86sec. no LOB. All performed without UE support and S for safety.    Stand Pivot Transfers 5: Supervision    Stand Pivot Transfer Details (indicate cue type and reason) performed x4  reps, from chair to mat and mat to chair. Once with RW and once without RW. S for safety.               Balance Exercises - 07/30/19 1534      Balance Exercises: Standing   Other Standing Exercises Performed at counter with S to min A for safety and to correct one LOB episode. Pt performed head turns and dynamic activities such as reaching outside BOS with one UE at a time in all directions for approx. 5 minutes.              PT Education - 07/30/19 1537    Education Details PT discussed STG progress and reviewed HEP with pt and her father, Nicki Reaper. PT provided cues for proper technique during HEP. PT discussed adding more appt's to maximize functional gains.    Person(s) Educated Patient;Parent(s)    Methods Explanation;Demonstration;Verbal cues;Handout    Comprehension Returned demonstration;Verbalized understanding;Need further instruction            PT Short Term Goals - 07/30/19 1156      PT SHORT TERM GOAL #1   Title Pt will participate in further balance and gait assessments to further assess falls risk    Baseline assessed DGI and MCTSIB    Time 4    Period Weeks    Status Achieved      PT SHORT TERM GOAL #2   Title Pt be able to perform HEP with supervision focusing on strength, standing balance and vestibular impairments    Baseline Total A    Time 4    Period Weeks    Status Achieved    Target Date 08/07/19      PT SHORT TERM GOAL #3   Title Pt will demonstrate ability to perform five time sit to stand in </= 13 seconds without use of UE and without any LOB posteriorly    Baseline 11.86 sec. no UE support and no LOB during third trial    Time 4    Period Weeks    Status Achieved    Target Date 08/07/19      PT SHORT TERM GOAL #4   Title Pt will demonstrate ability to perform stand pivot transfers consistently to L and R with supervision without RW    Baseline Supervision without rolling walker on 07/30/19    Time 4    Period Weeks    Status Achieved     Target Date 08/07/19      PT SHORT TERM GOAL #5   Title Pt will demonstrate ability to stand safely >5 minutes without UE support while performing dynamic UE  movements and head movements MOD I to decrease falls risk when performing ADL    Baseline required S to min A during last 2 minutes of standing 2/2 incr. postural sway and one LOB episode    Time 4    Period Weeks    Status Partially Met    Target Date 08/07/19             PT Long Term Goals - 07/18/19 1640      PT LONG TERM GOAL #1   Title Patient will demonstrate ability to perform final HEP independently, walk on treadmill at home with supervision and return to the gym for ongoing wellness    Baseline total A, not using treadmill, not going to the gym    Time 12    Period Weeks    Status New      PT LONG TERM GOAL #2   Title Pt will improve DGI without AD by 4 points to indicate decreased falls risk    Baseline 8/24 without AD, with min A    Time 12    Period Weeks    Status New      PT LONG TERM GOAL #3   Title Pt will demonstrate ability to perform each condition on MCTSIB for >15 seconds to indicate improved sensory integration    Baseline 20 seconds first, 14 seconds second, not able to perform 3/4.    Time 12    Period Weeks    Status Revised      PT LONG TERM GOAL #4   Title Pt will report no dizziness or headaches with bed mobility, transfers, head/body turns, bending down to the floor or looking up at the ceiling to decrease falls risk with ADL and mobility    Baseline mild dizziness with bed mobility, transfers and head/body turns during gait    Time 12    Period Weeks    Status New      PT LONG TERM GOAL #5   Title Pt will report ability to ambulate in her home without AD and perform ADL MOD I without parent's supervision for safety    Baseline ambulating with rolling walker supervision; supervision for ADL due to falls risk    Time 12    Period Weeks    Status New                 Plan  - 07/30/19 1539    Clinical Impression Statement Pt demonstrated progress as she met STGs 1, 2, 3, and 4. Pt partially met STG 5. Pt continues to experience incr. postural sway and intermittent LOB during txfs and dynamic standing balance activities. Pt would continue to benefit from skilled PT to improve safety and reduce dizziness during functional mobility.    Personal Factors and Comorbidities Comorbidity 3+;Finances;Fitness;Past/Current Experience    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, childhood, nystagmus, visual field defect, sensorineural hearing loss of bilat ears    Examination-Activity Limitations Bathing;Bend;Dressing;Locomotion Level;Stairs;Stand;Reach Overhead    Examination-Participation Restrictions Community Activity;School    Stability/Clinical Decision Making Evolving/Moderate complexity    Rehab Potential Good    PT Frequency Other (comment)   1x/week x 3 then 2x/week x 6   PT Duration 12 weeks    PT Treatment/Interventions ADLs/Self Care Home Management;Aquatic Therapy;Canalith Repostioning;DME Instruction;Gait training;Stair training;Functional mobility training;Patient/family education;Therapeutic  activities;Orthotic Fit/Training;Therapeutic exercise;Balance training;Neuromuscular re-education;Vestibular    PT Next Visit Plan Continue to add to HEP to focus on gaze stability, habituation, balance and coordination.  Neck proprioception(laser)    Consulted and Agree with Plan of Care Patient;Family member/caregiver    Family Member Consulted parents           Patient will benefit from skilled therapeutic intervention in order to improve the following deficits and impairments:  Abnormal gait, Decreased coordination, Difficulty walking, Dizziness,  Decreased activity tolerance, Decreased balance, Decreased strength, Impaired sensation, Pain  Visit Diagnosis: Other abnormalities of gait and mobility  Dizziness and giddiness  Unsteadiness on feet  Muscle weakness (generalized)  Repeated falls  Unspecified disturbances of skin sensation     Problem List Patient Active Problem List   Diagnosis Date Noted  . 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  . 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 Status post radiation therapy 11/22/2018  . Seasonal and perennial allergic rhinitis 11/22/2018  . Seasonal allergic conjunctivitis 11/22/2018  . Acute sinusitis 11/22/2018  . Keratosis pilaris 10/05/2017  . Melanocytic nevi of trunk 10/05/2017  . Adverse food reaction 02/09/2017  . Gastroesophageal reflux disease 02/09/2017  . Mild intermittent asthma with acute exacerbation 11/19/2015  . Mild persistent asthma without complication 63/01/6008  . Acute seasonal allergic rhinitis due to pollen 11/16/2015  . Anaphylactic shock due to adverse food reaction 11/16/2015  . 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 Type 1 diabetes mellitus without complication ([place]) 93/23/5573  . Dry mouth 11/16/2015  . Xerosis cutis 11/16/2015  . Nystagmus 08/15/2014  . Visual field defect 08/15/2014  . Disequilibrium 07/25/2014  . 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    Reshanda Lewey L 07/30/2019, 3:44 PM  [facility] [address] [place], [name], [address] Phone: [phone]   Fax:  [phone]  Name: Tracey Perkins MRN: [id number] Date of Birth: Jan 17, 1986  Geoffry Paradise, PT,DPT 07/30/19 3:45 PM Phone: [phone] Fax: [phone]

## 2019-08-15 ENCOUNTER — Encounter (HOSPITAL_COMMUNITY): Payer: Self-pay | Admitting: Emergency Medicine

## 2019-08-15 ENCOUNTER — Emergency Department (HOSPITAL_COMMUNITY)
Admission: EM | Admit: 2019-08-15 | Discharge: 2019-08-16 | Disposition: A | Discharge status: Home / Self Care | Payer: Medicaid Other | Attending: Emergency Medicine | Admitting: Emergency Medicine

## 2019-08-15 ENCOUNTER — Ambulatory Visit: Payer: Medicaid Other | Admitting: Physical Therapy

## 2019-08-15 DIAGNOSIS — F329 Major depressive disorder, single episode, unspecified: Secondary | ICD-10-CM | POA: Insufficient documentation

## 2019-08-15 DIAGNOSIS — Z85841 Personal history of malignant neoplasm of brain: Secondary | ICD-10-CM | POA: Insufficient documentation

## 2019-08-15 DIAGNOSIS — Z794 Long term (current) use of insulin: Secondary | ICD-10-CM | POA: Insufficient documentation

## 2019-08-15 DIAGNOSIS — F419 Anxiety disorder, unspecified: Secondary | ICD-10-CM

## 2019-08-15 DIAGNOSIS — R45851 Suicidal ideations: Secondary | ICD-10-CM | POA: Insufficient documentation

## 2019-08-15 DIAGNOSIS — E109 Type 1 diabetes mellitus without complications: Secondary | ICD-10-CM | POA: Diagnosis not present

## 2019-08-15 DIAGNOSIS — E039 Hypothyroidism, unspecified: Secondary | ICD-10-CM | POA: Diagnosis not present

## 2019-08-15 DIAGNOSIS — Z20822 Contact with and (suspected) exposure to covid-19: Secondary | ICD-10-CM | POA: Diagnosis not present

## 2019-08-15 DIAGNOSIS — F32A Depression, unspecified: Secondary | ICD-10-CM

## 2019-08-15 LAB — CBC WITH DIFFERENTIAL/PLATELET
Abs Immature Granulocytes: 0.06 10*3/uL (ref 0.00–0.07)
Basophils Absolute: 0.1 10*3/uL (ref 0.0–0.1)
Basophils Relative: 1 %
Eosinophils Absolute: 0.3 10*3/uL (ref 0.0–0.5)
Eosinophils Relative: 2 %
HCT: 38 % (ref 36.0–46.0)
Hemoglobin: 12.6 g/dL (ref 12.0–15.0)
Immature Granulocytes: 1 %
Lymphocytes Relative: 23 %
Lymphs Abs: 2.7 10*3/uL (ref 0.7–4.0)
MCH: 31.3 pg (ref 26.0–34.0)
MCHC: 33.2 g/dL (ref 30.0–36.0)
MCV: 94.3 fL (ref 80.0–100.0)
Monocytes Absolute: 0.7 10*3/uL (ref 0.1–1.0)
Monocytes Relative: 6 %
Neutro Abs: 7.7 10*3/uL (ref 1.7–7.7)
Neutrophils Relative %: 67 %
Platelets: 414 10*3/uL — ABNORMAL HIGH (ref 150–400)
RBC: 4.03 MIL/uL (ref 3.87–5.11)
RDW: 13.2 % (ref 11.5–15.5)
WBC: 11.6 10*3/uL — ABNORMAL HIGH (ref 4.0–10.5)
nRBC: 0 % (ref 0.0–0.2)

## 2019-08-15 LAB — COMPREHENSIVE METABOLIC PANEL
ALT: 17 U/L (ref 0–44)
AST: 14 U/L — ABNORMAL LOW (ref 15–41)
Albumin: 3.6 g/dL (ref 3.5–5.0)
Alkaline Phosphatase: 77 U/L (ref 38–126)
Anion gap: 9 (ref 5–15)
BUN: 10 mg/dL (ref 6–20)
CO2: 28 mmol/L (ref 22–32)
Calcium: 8.7 mg/dL — ABNORMAL LOW (ref 8.9–10.3)
Chloride: 94 mmol/L — ABNORMAL LOW (ref 98–111)
Creatinine, Ser: 0.95 mg/dL (ref 0.44–1.00)
GFR calc Af Amer: >60 mL/min (ref >60)
GFR calc non Af Amer: >60 mL/min (ref >60)
Glucose, Bld: 291 mg/dL — ABNORMAL HIGH (ref 70–99)
Potassium: 3.8 mmol/L (ref 3.5–5.1)
Sodium: 131 mmol/L — ABNORMAL LOW (ref 135–145)
Total Bilirubin: 0.2 mg/dL — ABNORMAL LOW (ref 0.3–1.2)
Total Protein: 7 g/dL (ref 6.5–8.1)

## 2019-08-15 LAB — RAPID URINE DRUG SCREEN, HOSP PERFORMED
Amphetamines: NOT DETECTED
Barbiturates: NOT DETECTED
Benzodiazepines: NOT DETECTED
Cocaine: NOT DETECTED
Opiates: NOT DETECTED
Tetrahydrocannabinol: NOT DETECTED

## 2019-08-15 LAB — I-STAT BETA HCG BLOOD, ED (MC, WL, AP ONLY): I-stat hCG, quantitative: <5 m[IU]/mL (ref <5)

## 2019-08-15 LAB — SARS CORONAVIRUS 2 BY RT PCR (HOSPITAL ORDER, PERFORMED IN ~~LOC~~ HOSPITAL LAB): SARS Coronavirus 2: NEGATIVE

## 2019-08-15 LAB — ETHANOL: Alcohol, Ethyl (B): <10 mg/dL (ref <10)

## 2019-08-15 MED ORDER — POTASSIUM CHLORIDE ER 10 MEQ PO TBCR
10.0000 meq | EXTENDED_RELEASE_TABLET | Freq: Every day | ORAL | Status: DC
  Administered 2019-08-16: 10 meq via ORAL
  Filled 2019-08-15 (×2): qty 1

## 2019-08-15 MED ORDER — NORGESTIMATE-ETH ESTRADIOL 0.25-35 MG-MCG PO TABS
1.0000 | ORAL_TABLET | Freq: Every day | ORAL | Status: DC
  Administered 2019-08-16: 1 via ORAL

## 2019-08-15 MED ORDER — INSULIN ASPART 100 UNIT/ML ~~LOC~~ SOLN
80.0000 [IU] | Freq: Once | SUBCUTANEOUS | Status: AC
  Administered 2019-08-15: 80 [IU] via SUBCUTANEOUS
  Filled 2019-08-15: qty 0.8

## 2019-08-15 MED ORDER — ONDANSETRON 4 MG PO TBDP: 4.0000 mg | ORAL_TABLET | Freq: Three times a day (TID) | ORAL | Status: DC | PRN

## 2019-08-15 MED ORDER — INSULIN PUMP
SUBCUTANEOUS | Status: DC
  Filled 2019-08-15: qty 1

## 2019-08-15 MED ORDER — MONTELUKAST SODIUM 10 MG PO TABS
10.0000 mg | ORAL_TABLET | Freq: Every day | ORAL | Status: DC
  Administered 2019-08-15: 10 mg via ORAL
  Filled 2019-08-15: qty 1

## 2019-08-15 MED ORDER — PANTOPRAZOLE SODIUM 40 MG PO TBEC
40.0000 mg | DELAYED_RELEASE_TABLET | Freq: Every day | ORAL | Status: DC
  Administered 2019-08-15 – 2019-08-16 (×2): 40 mg via ORAL
  Filled 2019-08-15 (×2): qty 1

## 2019-08-15 MED ORDER — MAGNESIUM OXIDE 400 MG PO TABS
800.0000 mg | ORAL_TABLET | Freq: Every day | ORAL | Status: DC
  Filled 2019-08-15: qty 2

## 2019-08-15 MED ORDER — LUBIPROSTONE 8 MCG PO CAPS
8.0000 ug | ORAL_CAPSULE | Freq: Two times a day (BID) | ORAL | Status: DC
  Administered 2019-08-16: 8 ug via ORAL
  Filled 2019-08-15: qty 1

## 2019-08-15 MED ORDER — LEVOTHYROXINE SODIUM 88 MCG PO TABS
88.0000 ug | ORAL_TABLET | Freq: Every day | ORAL | Status: DC
  Administered 2019-08-16: 88 ug via ORAL
  Filled 2019-08-15: qty 1

## 2019-08-15 MED ORDER — LORATADINE 10 MG PO TABS
10.0000 mg | ORAL_TABLET | Freq: Every day | ORAL | Status: DC
  Administered 2019-08-16: 10 mg via ORAL
  Filled 2019-08-15: qty 1

## 2019-08-15 MED ORDER — GABAPENTIN 300 MG PO CAPS
300.0000 mg | ORAL_CAPSULE | Freq: Every day | ORAL | Status: DC
  Administered 2019-08-15: 300 mg via ORAL
  Filled 2019-08-15: qty 1

## 2019-08-15 MED ORDER — ALBUTEROL SULFATE HFA 108 (90 BASE) MCG/ACT IN AERS: 1.0000 | INHALATION_SPRAY | RESPIRATORY_TRACT | Status: DC | PRN

## 2019-08-15 MED ORDER — ACETAMINOPHEN 500 MG PO TABS
500.0000 mg | ORAL_TABLET | Freq: Four times a day (QID) | ORAL | Status: DC | PRN
  Administered 2019-08-15: 500 mg via ORAL
  Filled 2019-08-15: qty 1

## 2019-08-15 MED ORDER — NORELGESTROMIN-ETH ESTRADIOL 150-35 MCG/24HR TD PTWK: 1.0000 | MEDICATED_PATCH | TRANSDERMAL | Status: DC

## 2019-08-15 MED ORDER — AZELASTINE HCL 0.1 % NA SOLN
2.0000 | Freq: Two times a day (BID) | NASAL | Status: DC
  Administered 2019-08-16: 2 via NASAL
  Filled 2019-08-15: qty 30

## 2019-08-15 MED ORDER — GLUCAGON HCL RDNA (DIAGNOSTIC) 1 MG IJ SOLR: 1.0000 mg | Freq: Once | INTRAMUSCULAR | Status: DC | PRN

## 2019-08-15 MED ORDER — FLUTICASONE PROPIONATE 50 MCG/ACT NA SUSP
1.0000 | Freq: Every day | NASAL | Status: DC
  Administered 2019-08-16: 1 via NASAL
  Filled 2019-08-15: qty 16

## 2019-08-15 NOTE — ED Notes (Signed)
Pt has been getting up by herself and going to the bathroom and moving around in her room. At times she might grab her walker, but she has been moving fine without it. Before her mother left from visiting, her mother has instructed her to make sure she use her walker when she gets up at night.

## 2019-08-15 NOTE — BH Assessment (Addendum)
Patient initially accepted to the [facility] for admission. However, upon review by Marvia Pickles, NP, the accepting provider at the [facility], patient would not be appropriate for admission. It's reported that patient is not appropriate due to medical acuity and her need for assistance to complete her ADL's.  Due to patient's inappropriateness for the [facility], inpatient is now recommended, per [facility]. Clinician consulted with [facility], RN who reviewed patient for admission to [facility]. Upon review of patient's chart it was determined that patient is also not appropriate for [facility] due to medical acuity and assistance need to complete her ADL's.   Clinician referred patient out to the following hospitals for consideration of a appropriate bed:  [facility] Details      [facility] Details      [facility] Details      [facility] Details      [facility] Details      [facility] Details      [facility] Details      [facility] Details      [facility]

## 2019-08-15 NOTE — ED Notes (Signed)
Attempted to call [place], no answer-no one is able to give writer an explanation as to why patient was not excepted and is now needing inpatient-writer will inform patient

## 2019-08-15 NOTE — ED Notes (Signed)
Patient can disconnect insulin pump and have insulin administered per RN

## 2019-08-15 NOTE — BH Assessment (Signed)
Assessment Note  Tracey Perkins is an 34 y.o. female who presented to the [facility] with her mother due to suicidal ideation.  Patient states that she had a fall earlier this year and had a concussion.  She was hospitalized for three weeks.  Since her fall, patient has experienced a personality change, she has been a little more impulsive and she has mood swings more often than usual.  In additional to all this that she is trying to deal with, patient's boyfriend broke up with her.  Patient states that she has been experiencing increased depression and has made statements to her family about killing herself.  Patient states that all of what she is going through now has triggered past trauma memories from her brain surgery. Patient states that she has never tried to kill herself in the past and she states that she does not want to kill herself now.  However, patient has multiple risk factors and she is very impulsive and not processing as well as she used to.  Patient states that she has been constantly crying, her anxiety level has been very high and she has been struggling emotionally.  Patient denies HI/Psychosis.  She denies any history of substance use.  Patient states that she sleeps on average six hours per night, but states that she does not have a good appetite.  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  Patient lives with her mother and has good family support.  TTS spoke to patient's mother, Tracey Perkins, who was with her during the assessment process.  Mother states that patient has been easily agitated at times, she has been experiencing mood swings and irritability.  Mother states that patient is more impulsive and does not manage her life like she used to.  Mother is concerned and states that she does not feel safe to take her home.  Mother states that she slept with patient last night to insure her safety.  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 Asthma   . Brain tumor ([place])   . Diabetes mellitus without complication ([place])   . Eczema   . Food allergy   . Gastroparesis   . Hypokalemia   . Hypomagnesemia   . Hypothyroid   . Neuropathy   . Thyroid disease   . Vision changes     Past Surgical History:  Procedure Laterality Date  . BRAIN SURGERY    . Portacath placement and removed    . WISDOM TOOTH EXTRACTION      Family History:  Family History  Problem Relation Age of Onset  . Thyroid disease Mother   . Eczema Sister   . Thyroid disease Sister   . Thyroid disease Sister     Social History:  reports that she has never smoked. She has never used smokeless tobacco. She reports that she does not drink alcohol and does not use drugs.  Additional Social History:  Alcohol / Drug Use Pain Medications: see MAR Prescriptions: see MAR Over the Counter: see MAR History of alcohol / drug use?: No history of alcohol / drug abuse Longest period of sobriety (when/how long): NA  CIWA: CIWA-Ar BP: (!) 140/102 Pulse Rate: 96 COWS:    Allergies:  Allergies  Allergen Reactions  . Banana Other (See Comments)    Mouth burning and swollen  . Bactrim [Sulfamethoxazole-Trimethoprim] Hives  .  Doxycycline Hives  . Gentian Violet Other (See Comments)    "It burns."  Per mother  . Gentian Violet-Proflavine Sulfate [Triple Dye] Other (See Comments)    Mouth burn  . Mold Extract [Trichophyton] Other (See Comments)    Hay Fever  . Morphine And Related Hives  . Other Other (See Comments) and Hives    "Hay fever, dust and pollen."  Per patient.  . Vancomycin Other (See Comments)    Red man syndrome     Home Medications: (Not in a hospital admission)   OB/GYN Status:  No LMP recorded.  General Assessment Data [facility] of Assessment: [facility] TTS Assessment: In system Is this a Tele or Face-to-Face Assessment?: Tele Assessment Is this an  Initial Assessment or a Re-assessment for this encounter?: Initial Assessment Patient Accompanied by:: Parent Language Other than English: No Living Arrangements: Other (Comment) (with parents) What gender do you identify as?: Female Date Telepsych consult ordered in [facility]: 08/15/19 Time Telepsych consult ordered in [place]: 1453 Marital status: Single Maiden name: [place] Living Arrangements: Parent Can pt return to current living arrangement?: Yes Admission Status: Voluntary Is patient capable of signing voluntary admission?: Yes Referral Source: Self/Family/Friend Insurance type: Medicaid     Crisis Care Plan Living Arrangements: Parent Legal Guardian: Other: (self) Name of Psychiatrist: none Name of Therapist: none  Education Status Is patient currently in school?: No Is the patient employed, unemployed or receiving disability?: Unemployed (patient has not worked since her hospitalization)  Risk to self with the past 6 months Suicidal Ideation: Yes-Currently Present Has patient been a risk to self within the past 6 months prior to admission? : No Suicidal Intent: No Has patient had any suicidal intent within the past 6 months prior to admission? : No Is patient at risk for suicide?: Yes Suicidal Plan?: No Has patient had any suicidal plan within the past 6 months prior to admission? : No Access to Means: No What has been your use of drugs/alcohol within the last 12 months?:  (none) Previous Attempts/Gestures: No How many times?: 0 Other Self Harm Risks: recent break-up with boyfriend and medical issues Triggers for Past Attempts: None known Intentional Self Injurious Behavior: None Family Suicide History: No Recent stressful life event(s): Job Loss, Trauma (Comment) (break-up with boyfriend) Persecutory voices/beliefs?: No Depression: Yes Depression Symptoms: Despondent, Tearfulness, Loss of interest in usual pleasures, Feeling worthless/self pity Substance abuse history  and/or treatment for substance abuse?: No Suicide prevention information given to non-admitted patients: Not applicable  Risk to Others within the past 6 months Homicidal Ideation: No Does patient have any lifetime risk of violence toward others beyond the six months prior to admission? : No Thoughts of Harm to Others: No Current Homicidal Intent: No Current Homicidal Plan: No Access to Homicidal Means: No Identified Victim: none History of harm to others?: No Assessment of Violence: None Noted Violent Behavior Description: none Does patient have access to weapons?: No Criminal Charges Pending?: No Does patient have a court date: No Is patient on probation?: No  Psychosis Hallucinations: None noted Delusions: None noted  Mental Status Report Appearance/Hygiene: Unremarkable Eye Contact: Good Motor Activity: Freedom of movement Speech: Logical/coherent Level of Consciousness: Alert Mood: Depressed, Anxious Affect: Blunted, Flat Anxiety Level: Moderate Thought Processes: Coherent, Relevant Judgement: Impaired Orientation: Person, Place, Time, Situation Obsessive Compulsive Thoughts/Behaviors: None  Cognitive Functioning Concentration: Fair Memory: Remote Intact, Recent Impaired Is patient IDD: No Insight: Poor Impulse Control: Poor Appetite: Fair Sleep: Decreased Total Hours of Sleep: 8 Vegetative  Symptoms: None  ADLScreening [facility] Assessment Services) Patient's cognitive ability adequate to safely complete daily activities?: Yes Patient able to express need for assistance with ADLs?: No Independently performs ADLs?: Yes (appropriate for developmental age)  Prior Inpatient Therapy Prior Inpatient Therapy: No  Prior Outpatient Therapy Prior Outpatient Therapy: No Does patient have an ACCT team?: No Does patient have Intensive In-House Services?  : No Does patient have [facility] services? : No Does patient have P4CC services?: No  ADL Screening (condition at  time of admission) Patient's cognitive ability adequate to safely complete daily activities?: Yes Is the patient deaf or have difficulty hearing?: No Does the patient have difficulty seeing, even when wearing glasses/contacts?: No Does the patient have difficulty concentrating, remembering, or making decisions?: No Patient able to express need for assistance with ADLs?: No Does the patient have difficulty dressing or bathing?: No Independently performs ADLs?: Yes (appropriate for developmental age) Does the patient have difficulty walking or climbing stairs?: No Weakness of Legs: None Weakness of Arms/Hands: None  Home Assistive Devices/Equipment Home Assistive Devices/Equipment: None  Therapy Consults (therapy consults require a physician order) PT Evaluation Needed: No OT Evalulation Needed: No SLP Evaluation Needed: No Abuse/Neglect Assessment (Assessment to be complete while patient is alone) Abuse/Neglect Assessment Can Be Completed: Yes Physical Abuse: Denies Verbal Abuse: Denies Sexual Abuse: Denies Exploitation of patient/patient's resources: Denies Self-Neglect: Denies Values / Beliefs Cultural Requests During Hospitalization: None Spiritual Requests During Hospitalization: None Consults Spiritual Care Consult Needed: No Transition of Care Team Consult Needed: No Advance Directives (For Healthcare) Does Patient Have a Medical Advance Directive?: No Would patient like information on creating a medical advance directive?: No - Patient declined Nutrition Screen- [facility] Has the patient recently lost weight without trying?: No Has the patient been eating poorly because of a decreased appetite?: No Malnutrition Screening Tool Score: 0        Disposition: Per Marvia Pickles, NP, patient is recommended for inpatient treatment Disposition Initial Assessment Completed for this Encounter: Yes  On Site Evaluation by:   Reviewed with Physician:    Judeth Porch  Fontaine Hehl 08/15/2019 5:20 PM

## 2019-08-15 NOTE — ED Provider Notes (Signed)
[facility] DEPT Provider Note   CSN: [id number] Arrival date & time: 08/15/19  1009     History Chief Complaint  Patient presents with  . Depression  . Suicidal    Tracey Perkins is a 34 y.o. female.  HPI   Patient presents to the ED for evaluation of depression.  Patient states she has history of multiple medical problems since childhood.  She does have a history of a brain tumor as well as type 1 diabetes treated with an insulin pump.  Patient states several weeks ago she had a fall that led to a concussion.  She had prolonged symptoms requiring outpatient treatment.  Patient states this past Friday her boyfriend broke up with her.  Patient has been understandably upset about this.  All these things have caused her a lot of stress.  She had a conversation with her mother and briefly mentioned having fleeting thoughts of suicide.  Patient feels like she is depressed however  states she has no intention of harming herself.  Patient states she hopes to just die in her sleep at an old age.  Patient's mother called the PCP and they suggested she come to the ED. Patient denies any acute medical issues right now.  She states her blood sugar has been well controlled although it might be a little bit high now.  She is drinking a Wellsite geologist Past Medical History:  Diagnosis Date  . Asthma   . Brain tumor ([place])   . Diabetes mellitus without complication ([place])   . Eczema   . Food allergy   . Gastroparesis   . Hypokalemia   . Hypomagnesemia   . Hypothyroid   . Neuropathy   . Thyroid disease   . Vision changes     Patient Active Problem List   Diagnosis Date Noted  . 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  . 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 Status post radiation therapy 11/22/2018  . Seasonal and perennial allergic rhinitis 11/22/2018  . Seasonal allergic conjunctivitis 11/22/2018  . Acute sinusitis 11/22/2018  . Keratosis pilaris  10/05/2017  . Melanocytic nevi of trunk 10/05/2017  . Adverse food reaction 02/09/2017  . Gastroesophageal reflux disease 02/09/2017  . Mild intermittent asthma with acute exacerbation 11/19/2015  . Mild persistent asthma without complication 07/10/7626  . Acute seasonal allergic rhinitis due to pollen 11/16/2015  . Anaphylactic shock due to adverse food reaction 11/16/2015  . 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 Type 1 diabetes mellitus without complication ([place]) 31/51/7616  . Dry mouth 11/16/2015  . Xerosis cutis 11/16/2015  . Nystagmus 08/15/2014  . Visual field defect 08/15/2014  . Disequilibrium 07/25/2014  . 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 BRAIN SURGERY    . Portacath placement and removed    . WISDOM TOOTH EXTRACTION       OB History   No obstetric history on file.     Family History  Problem Relation Age of Onset  . Thyroid disease Mother   . Eczema Sister   . Thyroid disease Sister   . Thyroid disease Sister     Social History   Tobacco Use  . Smoking status: Never Smoker  . Smokeless tobacco: Never Used  Vaping Use  . Vaping Use: Never used  Substance Use Topics  . Alcohol use: No  . Drug use: No    Home Medications Prior to Admission medications   Medication  Sig Start Date End Date Taking? Authorizing Provider  acetaminophen (TYLENOL) 500 MG tablet Take 500 mg by mouth every 6 (six) hours as needed for moderate pain.    Yes [provider]  aspirin EC 81 MG tablet Take 81 mg by mouth daily. Swallow whole.   Yes [provider]  azelastine (ASTELIN) 0.1 % nasal spray Place 2 sprays into both nostrils 2 (two) times daily. 02/19/18  Yes Ambs, Kathrine Cords, FNP  cetirizine (ZYRTEC) 10 MG tablet Take 1 tablet (10 mg total) by mouth daily. 11/22/18  Yes Ambs, Kathrine Cords, FNP  citalopram (CELEXA) 10 MG tablet Take 10 mg by mouth daily. 07/26/19  Yes [provider]    EPINEPHrine 0.3 mg/0.3 mL IJ SOAJ injection Use as directed for severe allergic reaction. Patient taking differently: Inject 0.3 mg into the muscle as needed for anaphylaxis. Use as directed for severe allergic reaction. 02/19/18  Yes Ambs, Kathrine Cords, FNP  fluticasone (FLONASE) 50 MCG/ACT nasal spray 2 sprays per nostril daily as needed for stuffy nose. Patient taking differently: Place 2 sprays into both nostrils daily as needed for allergies.  11/22/18  Yes Ambs, Kathrine Cords, FNP  gabapentin (NEURONTIN) 300 MG capsule Take 300 mg by mouth at bedtime.  01/25/17  Yes [provider]  glucagon (GLUCAGON EMERGENCY) 1 MG injection Inject 1 mg into the skin once as needed (for diabeties).  06/30/16  Yes [provider]  ibuprofen (ADVIL) 200 MG tablet Take 200 mg by mouth every 6 (six) hours as needed for moderate pain.   Yes [provider]  insulin glargine (LANTUS) 100 UNIT/ML injection Inject 16 Units into the skin daily.  11/19/15  Yes [provider]  Insulin Human (INSULIN PUMP) SOLN Inject 80 each into the skin daily. Novolog Insulin Pump   Yes [provider]  KLOR-CON M10 10 MEQ tablet Take 10 mEq by mouth daily. 03/18/19  Yes [provider]  levothyroxine (SYNTHROID, LEVOTHROID) 88 MCG tablet Take 88 mcg by mouth daily before breakfast.   Yes [provider]  lubiprostone (AMITIZA) 8 MCG capsule Take 16 mcg by mouth daily with breakfast.    Yes [provider]  magnesium oxide (MAG-OX) 400 MG tablet Take 800 mg by mouth at bedtime.   Yes [provider]  meclizine (ANTIVERT) 25 MG tablet Take 25 mg by mouth 2 (two) times daily as needed for dizziness.  06/19/19  Yes [provider]  montelukast (SINGULAIR) 10 MG tablet Take 1 tablet (10 mg total) by mouth at bedtime. To prevent coughing or wheezing 11/22/18  Yes Ambs, Kathrine Cords, FNP  norgestimate-ethinyl estradiol (ORTHO-CYCLEN) 0.25-35 MG-MCG tablet Take 1 tablet by mouth  daily.    Yes [provider]  pantoprazole (PROTONIX) 40 MG tablet Take 40 mg by mouth daily as needed (acid reflux/heartburn).  07/01/19  Yes [provider]  PROAIR HFA 108 (90 Base) MCG/ACT inhaler TAKE 2 PUFFS BY MOUTH EVERY 4 HOURS AS NEEDED Patient taking differently: Inhale 2 puffs into the lungs every 6 (six) hours as needed for wheezing or shortness of breath.  04/01/19  Yes Ambs, Kathrine Cords, FNP  Continuous Blood Gluc Sensor (FREESTYLE LIBRE SENSOR SYSTEM) MISC USE 1 DEVICE EVERY 10 (TEN) DAYS. 01/29/17   [provider]  Continuous Blood Gluc Transmit (DEXCOM G6 TRANSMITTER) MISC USE 1 EACH EVERY 3 (THREE) MONTHS 06/24/19   [provider]  fluticasone (FLOVENT HFA) 110 MCG/ACT inhaler Inhale 2 puffs into the lungs 1  day or 1 dose for 1 dose. 08/20/18 08/21/18  Dara Hoyer, FNP  glucose blood test strip Use 3 (three) times daily Use as instructed. 05/11/17   [provider]    Allergies    Banana, Bactrim [sulfamethoxazole-trimethoprim], Doxycycline, Gentian violet, Gentian violet-proflavine sulfate [triple dye], Mold extract [trichophyton], Morphine and related, Other, and Vancomycin  Review of Systems   Review of Systems  All other systems reviewed and are negative.   Physical Exam Updated Vital Signs BP 115/82 (BP Location: Left Arm)   Pulse 93   Temp 98.3 F (36.8 C) (Oral)   Resp 15   SpO2 93%   Physical Exam Vitals and nursing note reviewed.  Constitutional:      General: She is not in acute distress.    Appearance: She is well-developed.  HENT:     Head: Normocephalic and atraumatic.     Right Ear: External ear normal.     Left Ear: External ear normal.  Eyes:     General: No scleral icterus.       Right eye: No discharge.        Left eye: No discharge.     Conjunctiva/sclera: Conjunctivae normal.  Neck:     Trachea: No tracheal deviation.  Cardiovascular:     Rate and Rhythm: Normal rate and regular rhythm.  Pulmonary:       Effort: Pulmonary effort is normal. No respiratory distress.     Breath sounds: Normal breath sounds. No stridor. No wheezing or rales.  Abdominal:     General: Bowel sounds are normal. There is no distension.     Palpations: Abdomen is soft.     Tenderness: There is no abdominal tenderness. There is no guarding or rebound.  Musculoskeletal:        General: No tenderness.     Cervical back: Neck supple.  Skin:    General: Skin is warm and dry.     Findings: No rash.  Neurological:     Mental Status: She is alert.     Cranial Nerves: No cranial nerve deficit (no facial droop, extraocular movements intact, no slurred speech).     Sensory: No sensory deficit.     Motor: No abnormal muscle tone or seizure activity.     Coordination: Coordination normal.  Psychiatric:        Attention and Perception: Attention normal.        Mood and Affect: Mood normal.        Speech: Speech normal.        Behavior: Behavior normal. Behavior is cooperative.        Thought Content: Thought content normal.     ED Results / Procedures / Treatments   Labs (all labs ordered are listed, but only abnormal results are displayed) Labs Reviewed  COMPREHENSIVE METABOLIC PANEL - Abnormal; Notable for the following components:      Result Value   Sodium 131 (*)    Chloride 94 (*)    Glucose, Bld 291 (*)    Calcium 8.7 (*)    AST 14 (*)    Total Bilirubin 0.2 (*)    All other components within normal limits  CBC WITH DIFFERENTIAL/PLATELET - Abnormal; Notable for the following components:   WBC 11.6 (*)    Platelets 414 (*)    All other components within normal limits  SARS CORONAVIRUS 2 BY RT PCR (HOSPITAL ORDER, [place] [facility])  ETHANOL  RAPID URINE DRUG SCREEN, HOSP  PERFORMED  I-STAT BETA HCG BLOOD, ED ([facility], [facility], [facility] ONLY)  CBG MONITORING, ED  CBG MONITORING, ED    EKG None  Radiology No results found.  Procedures Procedures (including critical care  time)  Medications Ordered in ED Medications  acetaminophen (TYLENOL) tablet 500 mg (500 mg Oral Given 08/15/19 2216)  azelastine (ASTELIN) 0.1 % nasal spray 2 spray (2 sprays Each Nare Refused 08/15/19 2216)  loratadine (CLARITIN) tablet 10 mg (10 mg Oral Not Given 08/15/19 2149)  fluticasone (FLONASE) 50 MCG/ACT nasal spray 1 spray (has no administration in time range)  gabapentin (NEURONTIN) capsule 300 mg (300 mg Oral Given 08/15/19 2216)  glucagon (human recombinant) (GLUCAGEN) injection 1 mg (has no administration in time range)  insulin pump ( Subcutaneous New Bag/Given 08/15/19 2215)  potassium chloride (KLOR-CON) CR tablet 10 mEq (has no administration in time range)  levothyroxine (SYNTHROID) tablet 88 mcg (88 mcg Oral Given 08/16/19 0924)  lubiprostone (AMITIZA) capsule 8 mcg (8 mcg Oral Given 08/16/19 0924)  magnesium oxide (MAG-OX) tablet 800 mg (has no administration in time range)  montelukast (SINGULAIR) tablet 10 mg (10 mg Oral Given 08/15/19 2215)  norgestimate-ethinyl estradiol (ORTHO-CYCLEN) 0.25-35 MG-MCG tablet 1 tablet (1 tablet Oral Not Given 08/15/19 2126)  pantoprazole (PROTONIX) EC tablet 40 mg (40 mg Oral Given 08/15/19 2355)  ondansetron (ZOFRAN-ODT) disintegrating tablet 4 mg (has no administration in time range)  albuterol (VENTOLIN HFA) 108 (90 Base) MCG/ACT inhaler 1-2 puff (has no administration in time range)  norelgestromin-ethinyl estradiol (ORTHO EVRA) 150-35 MCG/24HR transdermal patch 1 patch (1 patch Transdermal Not Given 08/15/19 1938)  insulin aspart (novoLOG) injection 80 Units (80 Units Subcutaneous Given 08/15/19 2044)  diphenhydrAMINE (BENADRYL) capsule 25 mg (25 mg Oral Given 08/16/19 0052)    ED Course  I have reviewed the triage vital signs and the nursing notes.  Pertinent labs & imaging results that were available during my care of the patient were reviewed by me and considered in my medical decision making (see chart for details).  Clinical Course  as of Aug 16 950  Thu Aug 15, 2019  1046 Patient does have type 1 diabetes.  We will do medical clearance labs.  Consider [facility] if stable   [ZH]  0865 Labs reviewed.  Hyperglycemia noted but no signs of acidosis.   [HQ]  4696 Patient is medically cleared   [JK]  1339 D/w Shavon Rankin.  Will review chart   [JK]  1344 Requests TTS in the meantime.  Ok to go to [facility]   [JK]  1628 Notified now that [facility] will not accept pt because of insulin pump   [JK]    Clinical Course User Index [JK] Dorie Rank, MD   MDM Rules/Calculators/A&P                          Pt presented to the ED with complaints of depression.  Denied active SI.  Medically stable.  Awaiting psychiatric recommendations at time of shift change.  Dispo pending. Final Clinical Impression(s) / ED Diagnoses Final diagnoses:  Depression, unspecified depression type      Dorie Rank, MD 08/16/19 [phone]

## 2019-08-15 NOTE — ED Triage Notes (Addendum)
Per pt, states she was recently hospitalized for 3 weeks due to a fall which led to a concussion-states recently her BF broke up with her-states just a lot of stress-states her parents are worried about her and mom called PCP and he referred them to ED-denies SI/HI-just wants a "happy shot" to make her feel better but realistically knows she needs to go thru "this"

## 2019-08-15 NOTE — ED Notes (Signed)
Pt not able to be accepted at [facility] per T. Money FNP and Dr. Dwyane Dee d/t insulin pump. Called charge nurse at [facility] to notify. Stated she would notify pt's nurse

## 2019-08-15 NOTE — ED Notes (Signed)
Pt to room 30. Pt alert, cooperative, no s/s of distress. Pt oriented to unit and room.

## 2019-08-15 NOTE — ED Notes (Signed)
This nurse was advised that unpon further review of pt chart by T. Money FNP @ [facility], that pt is not appropriate for facility d/t pt very poor ability to ambulate. This info was not made aware to [facility] staff at time of report.

## 2019-08-16 DIAGNOSIS — F419 Anxiety disorder, unspecified: Secondary | ICD-10-CM

## 2019-08-16 MED ORDER — DIPHENHYDRAMINE HCL 25 MG PO CAPS
25.0000 mg | ORAL_CAPSULE | Freq: Once | ORAL | Status: AC
  Administered 2019-08-16: 25 mg via ORAL
  Filled 2019-08-16: qty 1

## 2019-08-16 NOTE — Discharge Instructions (Signed)
For your behavioral health needs, you are advised to follow up with [facility].  You have been scheduled for the following appointments, both of which are virtual:       PSYCHIATRY:      Thursday, August 22, 2019 at 10:30 am with Eulis Canner, NP       THERAPY:      Tuesday, September 17, 2019 at 3:00 pm with Gara Kroner, [place]      [place], [place] [address]      [phone]

## 2019-08-16 NOTE — ED Notes (Signed)
Patient had complaints of a headache early in shift which was treated with prn Tylenol, later obtained an order for po benadryl due to patient stating that she could not sleep, Slept most of night post admin.

## 2019-08-16 NOTE — Progress Notes (Signed)
Patient was requested to come to [facility] for overnight observation yesterday. It was then discovered that patient had an insulin pump and that the insulin pump was not allowed in the observation unit and transfer was declined. Later found that the patient was unsteady with ambulation and using a walker at times. Nursing contacted me requesting to transfer patient if they remove the insulin pump. I informed them that there was not an appropriate bed due to the patient unable to perform ADLs independently due to unsteady gait and use of walker.

## 2019-08-16 NOTE — Consult Note (Addendum)
[facility] Psych ED Discharge  08/16/2019 10:25 AM Tracey Perkins  MRN:  [id number] Principal Problem: Anxiety Discharge Diagnoses: Principal Problem:   Anxiety   Subjective: Patient states "I know that when I talked to my mom several days ago I mentioned a fleeting thought of suicide but I am not trying to hurt myself or anyone else and I think my mom is very cautious so she brought me over."  Per TTS assessment: Tracey Perkins is an 34 y.o. female who presented to the [facility] with her mother due to suicidal ideation.  Patient states that she had a fall earlier this year and had a concussion.  She was hospitalized for three weeks.  Since her fall, patient has experienced a personality change, she has been a little more impulsive and she has mood swings more often than usual.  In additional to all this that she is trying to deal with, patient's boyfriend broke up with her.  Patient states that she has been experiencing increased depression and has made statements to her family about killing herself.  Patient states that all of what she is going through now has triggered past trauma memories from her brain surgery. Patient states that she has never tried to kill herself in the past and she states that she does not want to kill herself now.   Patient reports recent multiple stressors.  Recent medical concerns as well as break-up with boyfriend of 4 years. Patient resides in [place] with her mother and father.  Patient denies access to weapons.  Patient is not currently employed outside of home.  Patient denies alcohol and substance use.  Patient reports she has a history of anxiety, often triggered by shortness of breath related to asthma diagnosis.  Patient reports began antidepressant, Celexa approximately 2 months ago.  Patient reports she will not continue to use Celexa as she believes it changed her behavior and she "was not myself, I never cried while I was on it."  Patient reports use of "essential oils,  breathing techniques and music on my iPhone" as effective coping skills when feeling anxious.  Patient denies outpatient psychiatrist.  Patient reports history of treatment with outpatient counseling but has not resumed counseling since counselor moved away sometime ago.  On evaluation, patient is alert and oriented, pleasant and cooperative.  Patient participates actively in assessment.  Patient is appropriately groomed, speech is clear and coherent.  Patient reports anxious mood.  Patient reports readiness to discharge states "I had difficulty sleeping here last night."  Patient denies suicidal and homicidal ideations.  Patient denies history of suicide attempts.  Patient denies self-harm behaviors.  Patient denies auditory visual hallucinations.  There is no evidence that patient is responding to internal stimuli nor any evidence of delusional thought content.  Patient denies symptoms of paranoia.  Patient gives consent to speak with her mother, Dasja Brase phone number [phone]. Spoke with patient's mother, [name] who denies concerns for patient safety from a standpoint of self-harm.  Patient's mother requests assistance with securing follow-up with outpatient psychiatry and talk therapy.  Patient's mother reports plan to transport patient home this afternoon. Provided support and encouragement to patient and mother.  Total Time spent with patient: 45 minutes  Past Psychiatric History: Anxiety  Past Medical History:  Past Medical History:  Diagnosis Date   Asthma    Brain tumor ([place])    Diabetes mellitus without complication ([place])    Eczema    Food allergy    Gastroparesis  Hypokalemia    Hypomagnesemia    Hypothyroid    Neuropathy    Thyroid disease    Vision changes     Past Surgical History:  Procedure Laterality Date   BRAIN SURGERY     Portacath placement and removed     WISDOM TOOTH EXTRACTION     Family History:  Family History  Problem Relation Age  of Onset   Thyroid disease Mother    Eczema Sister    Thyroid disease Sister    Thyroid disease Sister    Family Psychiatric  History: None reported Social History:  Social History   Substance and Sexual Activity  Alcohol Use No     Social History   Substance and Sexual Activity  Drug Use No    Social History   Socioeconomic History   Marital status: Single    Spouse name: Not on file   Number of children: Not on file   Years of education: Not on file   Highest education level: Not on file  Occupational History   Not on file  Tobacco Use   Smoking status: Never Smoker   Smokeless tobacco: Never Used  Scientific laboratory technician Use: Never used  Substance and Sexual Activity   Alcohol use: No   Drug use: No   Sexual activity: Yes    Birth control/protection: Pill  Other Topics Concern   Not on file  Social History Narrative   Not on file   Social Determinants of Health   Financial Resource Strain:    Difficulty of Paying Living Expenses:   Food Insecurity:    Worried About Charity fundraiser in the Last Year:    Arboriculturist in the Last Year:   Transportation Needs:    Film/video editor (Medical):    Lack of Transportation (Non-Medical):   Physical Activity:    Days of Exercise per Week:    Minutes of Exercise per Session:   Stress:    Feeling of Stress :   Social Connections:    Frequency of Communication with Friends and Family:    Frequency of Social Gatherings with Friends and Family:    Attends Religious Services:    Active Member of Clubs or Organizations:    Attends Music therapist:    Marital Status:     Has this patient used any form of tobacco in the last 30 days? (Cigarettes, Smokeless Tobacco, Cigars, and/or Pipes) A prescription for an FDA-approved tobacco cessation medication was offered at discharge and the patient refused  Current Medications: Current Facility-Administered Medications   Medication Dose Route Frequency Provider Last Rate Last Admin   acetaminophen (TYLENOL) tablet 500 mg  500 mg Oral Q6H PRN Isla Pence, MD   500 mg at 08/15/19 2216   albuterol (VENTOLIN HFA) 108 (90 Base) MCG/ACT inhaler 1-2 puff  1-2 puff Inhalation Q4H PRN Isla Pence, MD       azelastine (ASTELIN) 0.1 % nasal spray 2 spray  2 spray Each Nare BID Isla Pence, MD       fluticasone (FLONASE) 50 MCG/ACT nasal spray 1 spray  1 spray Each Nare Daily Isla Pence, MD       gabapentin (NEURONTIN) capsule 300 mg  300 mg Oral QHS Isla Pence, MD   300 mg at 08/15/19 2216   glucagon (human recombinant) (GLUCAGEN) injection 1 mg  1 mg Subcutaneous Once PRN Isla Pence, MD       insulin pump  Subcutaneous Continuous Isla Pence, MD   New Bag at 08/15/19 2215   levothyroxine (SYNTHROID) tablet 88 mcg  88 mcg Oral QAC breakfast Isla Pence, MD   88 mcg at 08/16/19 0160   loratadine (CLARITIN) tablet 10 mg  10 mg Oral Daily Isla Pence, MD       lubiprostone (AMITIZA) capsule 8 mcg  8 mcg Oral BID WC Isla Pence, MD   8 mcg at 08/16/19 1093   magnesium oxide (MAG-OX) tablet 800 mg  800 mg Oral QHS Isla Pence, MD       montelukast (SINGULAIR) tablet 10 mg  10 mg Oral QHS Isla Pence, MD   10 mg at 08/15/19 2215   norelgestromin-ethinyl estradiol (ORTHO EVRA) 150-35 MCG/24HR transdermal patch 1 patch  1 patch Transdermal Weekly Isla Pence, MD       norgestimate-ethinyl estradiol (ORTHO-CYCLEN) 0.25-35 MG-MCG tablet 1 tablet  1 tablet Oral Daily Isla Pence, MD       ondansetron (ZOFRAN-ODT) disintegrating tablet 4 mg  4 mg Oral Q8H PRN Isla Pence, MD       pantoprazole (PROTONIX) EC tablet 40 mg  40 mg Oral Daily Isla Pence, MD   40 mg at 08/15/19 2355   potassium chloride (KLOR-CON) CR tablet 10 mEq  10 mEq Oral Daily Isla Pence, MD       Current Outpatient Medications  Medication Sig Dispense Refill   acetaminophen  (TYLENOL) 500 MG tablet Take 500 mg by mouth every 6 (six) hours as needed for moderate pain.      aspirin EC 81 MG tablet Take 81 mg by mouth daily. Swallow whole.     azelastine (ASTELIN) 0.1 % nasal spray Place 2 sprays into both nostrils 2 (two) times daily. 30 mL 5   cetirizine (ZYRTEC) 10 MG tablet Take 1 tablet (10 mg total) by mouth daily. 34 tablet 5   citalopram (CELEXA) 10 MG tablet Take 10 mg by mouth daily.     EPINEPHrine 0.3 mg/0.3 mL IJ SOAJ injection Use as directed for severe allergic reaction. (Patient taking differently: Inject 0.3 mg into the muscle as needed for anaphylaxis. Use as directed for severe allergic reaction.) 2 Device 1   fluticasone (FLONASE) 50 MCG/ACT nasal spray 2 sprays per nostril daily as needed for stuffy nose. (Patient taking differently: Place 2 sprays into both nostrils daily as needed for allergies. ) 16 g 5   gabapentin (NEURONTIN) 300 MG capsule Take 300 mg by mouth at bedtime.   3   glucagon (GLUCAGON EMERGENCY) 1 MG injection Inject 1 mg into the skin once as needed (for diabeties).      ibuprofen (ADVIL) 200 MG tablet Take 200 mg by mouth every 6 (six) hours as needed for moderate pain.     insulin glargine (LANTUS) 100 UNIT/ML injection Inject 16 Units into the skin daily.      Insulin Human (INSULIN PUMP) SOLN Inject 80 each into the skin daily. Novolog Insulin Pump     KLOR-CON M10 10 MEQ tablet Take 10 mEq by mouth daily.     levothyroxine (SYNTHROID, LEVOTHROID) 88 MCG tablet Take 88 mcg by mouth daily before breakfast.     lubiprostone (AMITIZA) 8 MCG capsule Take 16 mcg by mouth daily with breakfast.      magnesium oxide (MAG-OX) 400 MG tablet Take 800 mg by mouth at bedtime.     meclizine (ANTIVERT) 25 MG tablet Take 25 mg by mouth 2 (two) times daily as needed for dizziness.  montelukast (SINGULAIR) 10 MG tablet Take 1 tablet (10 mg total) by mouth at bedtime. To prevent coughing or wheezing 34 tablet 5    norgestimate-ethinyl estradiol (ORTHO-CYCLEN) 0.25-35 MG-MCG tablet Take 1 tablet by mouth daily.      pantoprazole (PROTONIX) 40 MG tablet Take 40 mg by mouth daily as needed (acid reflux/heartburn).      PROAIR HFA 108 (90 Base) MCG/ACT inhaler TAKE 2 PUFFS BY MOUTH EVERY 4 HOURS AS NEEDED (Patient taking differently: Inhale 2 puffs into the lungs every 6 (six) hours as needed for wheezing or shortness of breath. ) 6.7 g 0   Continuous Blood Gluc Sensor (FREESTYLE LIBRE SENSOR SYSTEM) MISC USE 1 DEVICE EVERY 10 (TEN) DAYS.  11   Continuous Blood Gluc Transmit (DEXCOM G6 TRANSMITTER) MISC USE 1 EACH EVERY 3 (THREE) MONTHS     fluticasone (FLOVENT HFA) 110 MCG/ACT inhaler Inhale 2 puffs into the lungs 1 day or 1 dose for 1 dose. 1 Inhaler 5   glucose blood test strip Use 3 (three) times daily Use as instructed.     PTA Medications: (Not in a hospital admission)   Musculoskeletal: Strength & Muscle Tone: decreased Gait & Station: unable to assess Patient leans: N/A  Psychiatric Specialty Exam: Physical Exam Vitals and nursing note reviewed.  Constitutional:      Appearance: She is well-developed.  HENT:     Head: Normocephalic.  Cardiovascular:     Rate and Rhythm: Normal rate.  Pulmonary:     Effort: Pulmonary effort is normal.  Neurological:     Mental Status: She is alert and oriented to person, place, and time.  Psychiatric:        Attention and Perception: Attention and perception normal.        Mood and Affect: Affect normal. Mood is anxious.        Speech: Speech normal.        Behavior: Behavior normal. Behavior is cooperative.        Thought Content: Thought content normal.        Cognition and Memory: Cognition normal.        Judgment: Judgment normal.     Review of Systems  Constitutional: Negative.   HENT: Negative for hearing loss (patient complains of difficulty hearing).   Eyes: Negative.   Respiratory: Negative.   Cardiovascular: Negative.    Gastrointestinal: Negative.   Genitourinary: Negative.   Musculoskeletal: Negative.   Skin: Negative.   Neurological: Negative.   Psychiatric/Behavioral: The patient is nervous/anxious.     Blood pressure 115/82, pulse 93, temperature 98.3 F (36.8 C), temperature source Oral, resp. rate 15, SpO2 93 %.There is no height or weight on file to calculate BMI.  General Appearance: Casual  Eye Contact:  Good  Speech:  Clear and Coherent and Normal Rate  Volume:  Normal  Mood:  Anxious  Affect:  Appropriate and Congruent  Thought Process:  Coherent, Goal Directed and Descriptions of Associations: Intact  Orientation:  Full (Time, Place, and Person)  Thought Content:  WDL and Logical  Suicidal Thoughts:  No  Homicidal Thoughts:  No  Memory:  Immediate;   Good Recent;   Good Remote;   Good  Judgement:  Fair  Insight:  Fair  Psychomotor Activity:  Normal  Concentration:  Concentration: Fair and Attention Span: Fair  Recall:  [employer] of Knowledge:  Fair  Language:  Fair  Akathisia:  No  Handed:  Right  AIMS (if indicated):  Assets:  Communication Skills Desire for Improvement Financial Resources/Insurance Housing Intimacy Leisure Time Social Support  ADL's:  Impaired  Cognition:  WNL  Sleep:        Demographic Factors:  Caucasian  Loss Factors: NA  Historical Factors: NA  Risk Reduction Factors:   Living with another person, especially a relative, Positive social support, Positive therapeutic relationship and Positive coping skills or problem solving skills  Continued Clinical Symptoms:  Medical Diagnoses and Treatments/Surgeries  Cognitive Features That Contribute To Risk:  None    Suicide Risk:  Minimal: No identifiable suicidal ideation.  Patients presenting with no risk factors but with morbid ruminations; may be classified as minimal risk based on the severity of the depressive symptoms    Plan Of Care/Follow-up recommendations:  Other:  Patient  reviewed with Dr. Hampton Abbot.  Recommend follow-up with outpatient psychiatry as well as counseling/talk therapy.  Disposition: Discharge Emmaline Kluver, FNP 08/16/2019, 10:25 AM

## 2019-08-16 NOTE — BH Assessment (Signed)
[place] Assessment Progress Note  Per Letitia Libra, NP, this pt does not require psychiatric hospitalization at this time.  Pt is to be discharged from [facility] with intake appointments at [facility].  At 10:18 I called [facility] and spoke to [facility].  She has scheduled pt for virtual appointments with Eulis Canner, NP for psychiatry on Thursday, 08/22/2019 at 10:30 am, and with Gara Kroner, LCSW for therapy on Tuesday, 09/17/2019 at 15:00.  These have been included in pt's discharge instructions.  Pt's nurse, Nena Jordan, has been notified.  Jalene Mullet, [place] Triage Specialist [phone]

## 2019-08-21 NOTE — Consults (Signed)
 Psychiatric Consultation & Liaison Service [facility] Initial ED Evaluation   Pt Name: Tracey Perkins MRN: [id number] DOB: [birth year] Chief complaint:  Chief Complaint  Patient presents with  . Suicidal   ED Provider requesting consultation: Sallean, Andrew John, MD Reason for psychiatric consultation: labile behavior and suicidal ideation   HISTORY OF PRESENT ILLNESS   Tracey Perkins is a 34 y.o. female w/ a PMH a recent concussion 05/2019 and childhood medulloblastoma s/p radiation therapy  who was brought into the ED by family for evaluation of behavioral dysregulation and suicidal ideation.   On interview, the patient was seen lying in bed reading a book. 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  She tells the reader that in May/2021  she hit her head (points to right frontal region) while over at a friends house. She immediately went to [facility], at which she had several imaging procedures done to her head and spine. It was determined that she had suffered a concussion. She spent 3 weeks in a inpatient rehab center. While in the rehab center, she became very tearful and anxious. As a result, the doctors prescribed Celexa  to help reduce some of her anxiety. After discharge, she had become very argumentative with her mother and had several intense outburst of anger.  Mother believed that medication was the cause of her anger. Spoke to an MD and they decided that she could stop taking the Celexa . The day she stopped taking the Celexa  her boyfriend of 4 years broke up with her without any explanation. In addition, he told all of his family members to not speak to her and to unfriend her on social media. This led to her calling and emailing him and his family members multiple times for answers. She also called a taxi one day to go to his house and confront him and his family on  the matter. On Saturday, he issued a restraining order on her. Shortly after the issuing of the restraining order, the patient emailed the boyfriend apologizing and requesting they settle things out of court. To which ex-boyfriend responded by telling her not to contact him again. Patient is tearful and apologetic about the entire ordeal.   The patient denies any active SI. She states that she would never kill herself. She denies any recent or previous self injurious behaviors. She states that she only said she wanted to hurt herself to express how badly her ex-boyfriend had hurt her. However, she has no intent of actually causing any harm to herself. Of note patient was recently seen on 7/29 at [facility] ED in [place] for depressive symptoms and suicidal ideation. She was observed and later discharge and was set up with a outpatient appointment with [facility] scheduled for 8/5.    The patient denies HI/VH/AH. She does not have access to guns. Feels safe in the hospital. Would like to connect with an outpatient psychiatrist to help her deal with depressed mood. Came to the hospital so see a neurologist to document her neurological issues for her to present in court. Requested teddy bear for comfort at the end of the encounter.   Their symptoms have been associated with: - significant distress - impaired social functioning - impaired family functioning  Tried call mother for collateral. No answer. Will attempt to call again in tomorrow for collateral. Phone number: [phone]  Review of Systems   Psychiatric ROS:  Yes No  Depressive symptoms  Comments  [x]  []   depressed mood   []  [x]   anhedonia   []  [x]   psychomotor retardation   []  [x]   >5% weight loss   [x]  []   changes in sleep patterns   [x]  []   fatigue   [x]  []   feelings of worthlessness   []  [x]   indecisiveness or poor concentration   []  [x]   hopelessness   []  [x]   apathy   Yes No  Anxiety  symptoms   [x]  []   excessive anxiety   [x]  []   difficulty controlling worries   []  [x]   muscle tension   []  [x]   restlessness   [x]  []   easily annoyed or irritated   []  [x]   panic attacks   []  [x]   specific phobias   Yes No  Manic symptoms   []  [x]   persistently elevated or irritable mood   []  [x]   grandiosity   []  [x]   decreased need for sleep   []  [x]   talkativeness   []  [x]   racing thoughts   []  [x]   increased goal-directed activity   []  [x]   excessive involvement in damaging activities   Yes No  Psychotic symptoms   []  [x]   delusions   []  [x]   hallucinations   []  [x]   paranoia   []  [x]   disorganized thinking   Yes No  Trauma symptoms   [x]  []   exposure to threat, injury, violence   []  [x]   repeated, disturbing memories of the event   []  [x]   repeated, disturbing dreams related to event   []  [x]   flashbacks/re-experiencing the event   []  [x]   avoiding memories/reminders of the event   []  [x]   hypervigilence    The patient has had a depressed mood surrounding her recent break up. The patient initially had some feelings of worthlessness and guilt as it relates the termination of their relationship but is beginning to realize that he was the jerk, not me.  The patient has had increased restlessness and poor sleep for the past 2 weeks. States that her mom has been giving her benadryl  to help her sleep.  Patient does not believe her behaviors have been impulsive. Patient states that her mother identified her impulsive behaviors as: buying 2 dozens donuts to share with her family (patient is normally very cheap with her money), offering to buy an outfit for her friend (again patient is very cheap with money), taking cab over to ex-boyfriend's house after he broke up with her  She has had increased anxiety and irritability since her head injury. Break up with boyfriend furthered these issues.  She has trauma related to her complex medical history (brain surgeries, radiation, long  hospitalizations)    Medical ROS: Constitutional: Negative for activity change and appetite change.  Cardiovascular: negative for chest pain or palpitations.  Respiratory: Negative for cough and shortness of breath.   Gastrointestinal: Negative for nausea and vomiting.  Neurological: Negative for dizziness, speech difficulty, weakness, numbness and headaches.    PAST PSYCHIATRIC HISTORY   Diagnoses: Personality Disorder NOS  Current meds: See EPIC  Current outpatient treatment: Has appt. With [facility] on 08/22/2019.   Prior outpatient treatment: Private community provider  Hospitalizations: No MH hospitalizations  Prior medication trials: Yes-Celexa   Non-suicidal self-injury: none  Suicide history: Denies any history of prior attempts. Denies any history of SI. -- Reports she texted SI statements to boyfriend so he  could see what she was going through. .  Violence history: None  Family psychiatric history: No known inpatient hospitalizations. Negative for attempted suicide. Negative for suicide.    SUBSTANCE USE HISTORY   Current substance use:  Tobacco: None  Alcohol : None  Illicit  Drugs: No  History of withdrawal: n/a   Stage of Change: n/a   SOCIAL HISTORY    Education: college   Relationship status: Single   Employment/Source of Income: Disability   Living Situation: lives with family  Primary support system: Mother, vocational rehab  Trauma History: severe medical illnesses (brain surgeries and radiation)   Baseline level of functioning:  ADLs: independent IADLs: not assessed   Payor: Payor: BCBS HEALTHY BLUE / Plan: HEALTHY BLUE [place] [employer] / Product Type: Medicaid /   Patient-reported legal issues: upcoming court date for a restraining order    FAMILY HISTORY   Family History  Problem Relation Age of Onset  . Thyroid disease Mother   . Thyroid disease Sister   . Diabetes type I Neg Hx   . Diabetes type II  Neg Hx    Family psychiatric history: Unknown.   PAST MEDICAL HISTORY   Past Medical History:  Diagnosis Date  . Chronic lymphocytic thyroiditis   . Hypokalemia   . Hypomagnesemia   . Medulloblastoma (CMS-HCC)   . Primary ovarian failure   . Status post radiation therapy   . Stroke (cerebrum) (CMS-HCC)    Per patient seen on last MRI  . Type I (juvenile type) diabetes mellitus without mention of complication, uncontrolled (CMS-HCC) 04/13/2011   Past Surgical History:  Procedure Laterality Date  . BRAIN SURGERY     Chemo, XRT  . CRANIOPLASTY FOR REPAIR SKULL DEFECT W/REPARATIVE BRAIN SURGERY       MEDICATIONS   (Not in a hospital admission)  Allergies  Allergen Reactions  . Bactrim [Sulfamethoxazole-Trimethoprim] Hives and Rash  . Vancomycin  Vancomycin  Infusion Reaction  . Banana Other (See Comments)    Mouth burning  . Doxycycline Hives  . Gentian Violet Unknown    It burns.  Per mother  . Grass Pollen Unknown    Hay fever  . House Dust Unknown  . Morphine Hives     PHYSICAL EXAM    Current Vital Signs 24h Vital Sign Ranges  T 36.3 C (97.3 F) (08/21/19 1741) Temp  Avg: 36.3 C (97.3 F)  Min: 36.3 C (97.3 F)  Max: 36.3 C (97.3 F)  BP 104/59 (08/21/19 1741) BP  Min: 104/59  Max: 104/59  HR (!) 113 (08/21/19 1741) Pulse  Avg: 113  Min: 113  Max: 113  RR 20 (08/21/19 1741) Resp  Avg: 20  Min: 20  Max: 20  O2sat 96 %   SpO2  Avg: 96 %  Min: 96 %  Max: 96 %       Weights   First 81.2 kg (179 lb) (08/21/19 1741)   Last 81.2 kg (179 lb) (08/21/19 1741)    Mental Status Exam:  Appearance: wearing hospital gown Attitude towards examiner: calm and cooperative Behavior and psychomotor activity: normal psychomotor activity Speech: well-formed and with normal rate, rhythm, and volume Mood: hopeful Affect: appropriate to situation, consistent with mood, congruent with thought content, even, broad range, normal intensity, and euthymic Thought process: linear  and goal directed Thought content: No SI, No HI and no delusions voiced Perceptions: not internally preoccupied Consciousness: alert Orientation: to time, place, and person Attention: grossly attentive to conversation Memory: immediate, recent, and remote  memory grossly intact  Fund of knowledge: grossly consistent with age and education Insight: limited awareness of their psychiatric illness Judgment: understands likely outcomes of their behavior   DATA REVIEWED   Chemistry: Recent Labs  Lab 08/20/19 0931 08/21/19 2256  NA 134* 136  K 4.4 4.2  CL 100 100  CO2 23 26  BUN 10 9  CREATININE 1.3* 1.1*  GLUCOSE 367* 323*  CALCIUM  8.7 9.1    CBC, diff: Recent Labs  Lab 08/20/19 0931 08/21/19 2256  WBC 10.8* 10.9*  HGB 13.0 12.5  HCT 39.2 36.9  MCV 92 93  PLT 397 366  NEUTCT 7.5 6.2  LYMPHCT 2.3 3.5  MONOCT 0.7 0.8  EOSCT 0.15 0.26  BASOCT 0.08 0.08    Urinalysis: Lab Results  Component Value Date   COLORU Yellow 06/11/2019   CLARITYU Clear 06/11/2019   SPECGRAV 1.015 06/11/2019   LABPH 7.0 06/11/2019   PROTEINUA Negative 06/11/2019   GLUCOSEU 2+ (!) 06/11/2019   KETONESU Negative 06/11/2019   BLOODU 3+ (!) 06/11/2019   NITRITE Negative 06/11/2019   LEUKOCYTESUR Negative 06/11/2019   BILIRUBINUR Negative 06/11/2019   UROBILINOGEN 0.2 06/11/2019    Urine Drug Screen:  Recent Labs    08/21/19 2050  AMPHETUR Negative  BARBURINE Negative  BENZOUR Negative  COCAINESCRN Negative  OPIATESUR Negative  THCURINE Negative    Serum Drug Screen: Recent Labs  Lab 08/20/19 0931 08/21/19 2050  ACETAMIN <10* <10*  SALICYLATE <40* <40*    Pregnancy: Lab Results  Component Value Date   POCHCG NEG 12/24/2011    EKG: No results for input(s): VENTRATE, PRINTERVAL, QRSINTERVAL, QTINTERVAL, QTC in the last 168 hours.  Liver Function: Recent Labs  Lab 08/21/19 2256  AST 16  ALT 17  TOTALPROTEIN 6.2  ALB 3.0*  TBILI 0.1*  ALKPHOS 76     Endocrine: Recent Labs  Lab 08/20/19 0931  TSH 6.39*  T4FREE 0.59     SAFETY ASSESSMENT   [place] Suicide Severity Rating Scale (CSSRS) Suicide Severity Rating Scale 06/10/2019 06/11/2019 06/19/2019 08/20/2019 08/21/2019  Completing Suicide Risk Assessment Yes Yes Yes Yes No  1. Have you wished you were dead or wished you could go to sleep and not wake up? No No No No No  2. Have you actually had any thoughts of killing yourself? No No No Yes No  3. Have you been thinking about how you might kill yourself? - - - No No  4. Have you had these thoughts and had some intention of acting on them? - - - No No  5. Have you started to work out or worked out the details of how to kill yourself? Do you intend to carry out this plan? - - - No No  6. Have you done anything, started to do anything, or prepared to do anything to end your life? - - - No No    Current risk factors include unmarried, caucasian and sleep disturbance.  Current protective factors include hopefulness, lack of substance use, positive social support, spiritual/cultural beliefs, supportive family and/or friends, lack of SI or HI, lack of prior suicide attempts, no history of violence, no known access to weapons/firearms, participation in school, safe housing and forward thinking/planning for future .  Given the patient's presentation at the time of this assessment and considering the above noted risk and protective factors, in my clinical judgment the patient's current risk potential for suicidal behavior is low acutely and low chronically, and their current risk potential for  harm to others is low.  Actions taken at this time to mitigate risk include: will monitor and re-assess in the morning.    DIAGNOSTIC ASSESSMENT   DSM-5 Diagnoses: Personality Disorder NOS  Tracey Perkins is a 34 y/o single caucasian female w/ a PMH of medulloblastoma s/p radiation  who presents to the [facility] ED for behavior  dysregulation and suicidal ideation in the context of both a head injury and the termination of a meaningful 4 year relationship with her ex-boyfriend. In May of this year, she suffered a concussion which landed her in a rehab for 3 weeks. After being discharged from this encounter, the patient became increasingly verbally aggressive to family members. Shortly there after, she stopped taking Celexa  and her ex-boyfriend ended their relationship. She went to ED at an OSH for passive SI related to the incident. During this time, she remained in the ED overnight and was sent home. The patient has no history of SI, self-injurious behavior, HI, or psychosis. She is calm and pleasant on exam. She understands the consequences of her actions and is amendable to seeking mental health resources outside of the hospital. 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 Does not need to be IVC'd at this time. Will continue to monitor and re-access in the morning. Will gather collateral from patient's mother.        RECOMMENDATIONS   Medically cleared: no, current physician hold by emergency medicine    Safety:  IVC: No Suicide precautions: no Physician hold order: yes Patient Care Attendant: yes FYI: None  Disposition:  TBD pending medical clearance.  Legal Guardian:  No. Pt is a competent adult who makes their own medical decisions.  Meds: Gabapentin  300 mg nightly Norgestimate -ethinyl estradiol  0.25-0.35 mg-mcg tablet   Outpatient Follow-Up:   appointment with [facility] on 8/5  Discussed case with attending psychiatrist, Tracey Perkins, M.D., who agrees with assessment and plan.  Thank you for allowing psychiatry to be involved in the care of this patient. Psychiatry ED Pager: [phone]  Tracey SIMONE JERELD COOMBS, MD 11:44  PM 08/21/2019   ------------------------------------------------------------------------------- Attestation signed by Nena Tawni Jansky, MD at 08/22/2019  3:40 PM I personally saw and evaluated the patient, and participated in the management and treatment plan as documented in the resident/fellow note.  Tracey MARIE FITZ, MD  Pt seen this morning, calm and cooperative with reassessment. She minimizes concerns brought up by parents (see collateral note below) - says parents can be too involved and overprotective and feels like they don't understand when I just want to do what I want to do.  She consistently denies SI throughout assessment. She does say she told mom that she's had fleeting thoughts of I just want to die in the context of feeling overwhelmede with sadness after recent breakup.  Denies any specific intent or plan. Denies SI now. She's forward-thinking and future-oriented throughout assessment talking about finishing her masters program, several personal and career goals she hopes to attain someday.  She denies difficulty sleeping, denies depressed mood. Endorses long h/o anxiety, recently trialed on Celexa  but says her parents thought this made her more angry.    Overall, collateral from parents inconsistent with how pt appears on evaluation today, with resident overnight, as well as with LCSW at [facility] yesterday (see note by Molly Smock).  I suspect that she likely does have some impulsivity and dysregulation in the setting of TBI (has known h/o brain tumor s/p resection & radiation as well as more recent head injury/concussion).  Unfortunately there are not acute safety concerns or psychiatric decompensation to treat on an inpatient psychiatric unit.  She also has been observed here in the ED overnight and through the day today without any s/sx of acute mania/psychosis, has been linear & organized, does not appear delirious or confused.  What would be most helpful would be  establishing with an outpatient therapist and psychiatrist to work through various life stressors and learn new coping skills when stressed.    -------------------------------------------------------------------------------

## 2019-08-22 ENCOUNTER — Telehealth (HOSPITAL_COMMUNITY): Payer: Medicaid Other | Admitting: Psychiatry

## 2019-08-22 ENCOUNTER — Other Ambulatory Visit: Payer: Self-pay

## 2019-08-22 NOTE — Progress Notes (Addendum)
 I called earlier in the day to update patient's family on evaluation completed by the resident overnight and the morning ED attending, which determined patient did not meet criteria for involuntary commitment to an inpatient psychiatric facility. Please refer to note by Dr. Craig with attestation from Dr. Nena for further detail. Requested mother come pick up the patient as the plan was for discharge with outpatient follow up.  Mother later presented to the ED and was not agreeable to taking patient home. I met in person with patient's RN (who confirmed patient had not been exhibiting any dangerous behaviors or making any threatening statements throughout the day, and had been following direction), mother, and several family friends. Mother reiterated the collateral given to the team earlier in the day (see note by medical student Maham Jaime).  As mother did not feel safe taking patient home, I agreed we would continue to observe her tonight and reconsider in the morning whether patient met criteria for referral for inpatient admission. Let her know I could not make any promises with respect to this as pt had recently been evaluated by multiple mental health providers who did not feel she met IVC criteria. Mother was satisfied with this plan and requests an update from the team in the morning.  Nena Daring, MD Psychiatry attending

## 2019-08-23 ENCOUNTER — Ambulatory Visit: Payer: Medicaid Other | Attending: Neurology | Admitting: Physical Therapy

## 2019-08-23 DIAGNOSIS — R296 Repeated falls: Secondary | ICD-10-CM | POA: Insufficient documentation

## 2019-08-23 DIAGNOSIS — R209 Unspecified disturbances of skin sensation: Secondary | ICD-10-CM | POA: Insufficient documentation

## 2019-08-23 DIAGNOSIS — R2681 Unsteadiness on feet: Secondary | ICD-10-CM | POA: Insufficient documentation

## 2019-08-23 DIAGNOSIS — M6281 Muscle weakness (generalized): Secondary | ICD-10-CM | POA: Insufficient documentation

## 2019-08-23 DIAGNOSIS — R42 Dizziness and giddiness: Secondary | ICD-10-CM | POA: Insufficient documentation

## 2019-08-23 DIAGNOSIS — R2689 Other abnormalities of gait and mobility: Secondary | ICD-10-CM | POA: Insufficient documentation

## 2019-08-23 NOTE — Progress Notes (Signed)
 Pt referred to the following facilities, with the resulting response  [facility] Faxed  [facility] Faxed  [facility] Faxed  [facility] Faxed  [facility] Faxed  [facility] Faxed  [facility] Faxed  [place] [facility] Faxed  [facility] Faxed  [facility] Faxed  [facility] Faxed  [facility] Faxed  [facility] Faxed  [facility] Faxed  [facility] Faxed  [facility] Faxed   Jerene Elouise HUGHS, LCAS-A Behavioral Health Case Manager Ph: [phone]

## 2019-08-23 NOTE — Progress Notes (Addendum)
 Emergency Psychiatry Service [facility] Follow-up Evaluation   Patient Name: Tracey Perkins Date of initial ED assessment: 08/21/2019 Chief complaint:  Chief Complaint  Patient presents with  . Suicidal   Reason for psychiatric consultation: behavioral dysregulation & SI   INTERVAL HISTORY   Subjective:  Tracey Perkins calm and pleasant on reassessment. Continues to minimize and deny many of the concerns brought up by family. She does say that she has voiced suicidal statements but denies any intent or plan, I didn't mean I would actually kill myself.  Endorses feeling anxious at times, otherwise denies any other psych sx. Says she feels like parents are too overprotective, and worries they're going to lock her up in a facility, I just want to live my life    [place] Suicide Severity Rating Scale (C-SSRS) Suicide Severity Rating Scale 06/11/2019 06/19/2019 08/20/2019 08/21/2019  Completing Suicide Risk Assessment Yes Yes Yes No  1. Have you wished you were dead or wished you could go to sleep and not wake up? No No No No  2. Have you actually had any thoughts of killing yourself? No No Yes No  3. Have you been thinking about how you might kill yourself? - - No No  4. Have you had these thoughts and had some intention of acting on them? - - No No  5. Have you started to work out or worked out the details of how to kill yourself? Do you intend to carry out this plan? - - No No  6. Have you done anything, started to do anything, or prepared to do anything to end your life? - - No No    [place] Violence Checklist:      Patient Care Attendant data: Observation Type: 1:1 monitoring with continuous visual observation Patient Activity: Awake Patient Behavior: Calm, Cooperative Patient Position: Lying Patient Activity: Awake (08/23/19 1300)   MEDICATIONS   Scheduled Medications: gabapentin , 300 mg, Oral, QHS [START ON 08/25/2019] norgestimate -ethinyl estradioL , 1 tablet, Oral,  Daily Patient's Own Medications Stored in PS[facility] ([facility]), , XX, As Directed Admin   Administered medications: Medications  norgestimate -ethinyl estradioL  (SPRINTEC  0.25/35 (28)) 0.25-35 mg-mcg tablet 1 tablet (has no administration in time range)  Patient's Own Medication Stored on Floor ( XX Given 08/22/19 1532)  dextrose  50% in water solution 12.5-25 g (has no administration in time range)  glucagon  (GLUCAGEN) injection 1 mg (has no administration in time range)  gabapentin  (NEURONTIN ) capsule 300 mg (300 mg Oral Not Given 08/22/19 2200)   Scheduled meds refused (if any): [id number] - MAR ACTION REPORT  (last 24 hrs)       PENNYE JACOBSEN, RN      Order ID Medication Name Action Time Action Reason Comments    [id number] gabapentin  (NEURONTIN ) capsule 300 mg 08/22/19 2200 Not Given Patient/family refused           PRN meds ordered: dextrose  50% in water, 12.5-25 g, Intravenous, As Directed glucagon , 1 mg, Intramuscular, As Directed   PRN meds given: Medication Administration Report for Tracey Perkins, Tracey Perkins as of 08/23/19 1340  Medications 08/22/19 08/23/19   dextrose  50% in water solution 12.5-25 g Dose: 12.5-25 g Freq: As Directed Route: IV PRN Comment: Per Hypoglycemia Protocol Start: 08/21/19 1827       glucagon  (GLUCAGEN) injection 1 mg Dose: 1 mg Freq: As Directed Route: IM PRN Reason: Low blood sugar PRN Comment: Per Hypoglycemia Protocol Start: 08/21/19 1827             EXAMINATION  Current Vital Signs 24h Vital Sign Ranges  T 36.4 C (97.5 F) (08/23/19 1200) Temp  Avg: 36.3 C (97.4 F)  Min: 36.3 C (97.3 F)  Max: 36.4 C (97.5 F)  BP 115/80 (08/23/19 1200) BP  Min: 107/80  Max: 115/80  HR 106 (08/23/19 1200) Pulse  Avg: 105  Min: 101  Max: 108  RR 18 (08/23/19 1200) Resp  Avg: 17.7  Min: 17  Max: 18  O2sat 93 %   SpO2  Avg: 95.5 %  Min: 93 %  Max: 98 %   Mental Status Exam:  Appearance: well-groomed, wearing paper scrubs, healthy, younger than stated age  and childlike Attitude towards examiner: calm and cooperative and friendly Behavior and psychomotor activity: normal psychomotor activity Speech: well-formed and with normal rate, rhythm, and volume Mood: pretty good Affect: appropriate to situation, consistent with mood, congruent with thought content, even, broad range, normal intensity, and euthymic Thought process: logical, linear, relevant, and goal-directed  Thought content: No SI, No HI and no delusions voiced Perceptions: not internally preoccupied Consciousness: alert Orientation: to time, place, and person Attention: grossly attentive to conversation Memory: immediate, recent, and remote memory grossly intact  Fund of knowledge: grossly consistent with age and education Insight: limited Judgment: impaired   SAFETY ASSESSMENT   [place] Suicide Severity Rating Scale (CSSRS) Suicide Severity Rating Scale 06/10/2019 06/11/2019 06/19/2019 08/20/2019 08/21/2019  Completing Suicide Risk Assessment Yes Yes Yes Yes No  1. Have you wished you were dead or wished you could go to sleep and not wake up? No No No No No  2. Have you actually had any thoughts of killing yourself? No No No Yes No  3. Have you been thinking about how you might kill yourself? - - - No No  4. Have you had these thoughts and had some intention of acting on them? - - - No No  5. Have you started to work out or worked out the details of how to kill yourself? Do you intend to carry out this plan? - - - No No  6. Have you done anything, started to do anything, or prepared to do anything to end your life? - - - No No    Current risk factors include unmarried, caucasian, impulsivity/poor self-control and recent trauma.  Current protective factors include hopefulness, lack of substance use, positive social support, life satisfaction, reality testing ability, accesses emergency services, supportive family and/or friends, lack of SI or HI, lack of prior suicide attempts, no  history of violence, no known access to weapons/firearms, safe housing and forward thinking/planning for future .  Given the patient's presentation at the time of this assessment and considering the above noted risk and protective factors, in my clinical judgment the patient's current risk potential for suicidal behavior is medium acutely and medium chronically, and their current risk potential for harm to others is negligible.  Actions taken at this time to mitigate risk include: - Maintain a safe environment by initiating involuntary commitment for psychiatric hospitalization    ASSESSMENT   Darlyn Repsher is a 34 y.o. female with a h/o T1DM, medulloblastoma s/p resection & radiation, anxiety presenting for behavior dysregulation and reported SI in the setting of recent head injury (concussion in 05/2019) and relationship ending.  While in the ED she has consistently minimized and deny concerns brought up by parents, denies SI/intent/plan and any other acute psychiatric concerns. However, collateral from family concerning for more significant dysregulation, impulsivity, and new SI which I suspect  likely related to underlying organic brain injury (h/o brain tumor s/p resection and recent concussion) as well as complex family dynamics.  Given drastically different information provided by pt & parents, I do not feel she is safe to discharge without further evaluation and closer outpt f/u (unable to get therapy appt until later this month).  Will place pt under iVC and refer for hospitalization.  Diagnoses:  Adjustment disorder with mixed disturbance of emotions and conduct R/o impulse control disorder H/o TBI    PLAN   Medically cleared: yes  Problems:  - IVC & refer for hospitalization - continue home meds, no new psych meds at this time will defer to inpt team  Safety:  IVC:  yes Suicide precautions: yes Physician hold order: yes Patient Care Attendant: yes  FYI: none   Disposition:   Psychiatry has begun referring patient to inpatient psychiatric facilities.   Legal Guardian:  No. Pt is a competent adult who makes their own medical decisions.  Outpatient Follow-Up:  To be determined by inpatient psychiatric facility.    Thank you for allowing psychiatry to be involved in the care of this patient. Psychiatry ED Pager: [phone] FITZ, MD 08/23/2019     DATA    Chemistry: Recent Labs  Lab 08/20/19 0931 08/21/19 2256  NA 134* 136  K 4.4 4.2  CL 100 100  CO2 23 26  BUN 10 9  CREATININE 1.3* 1.1*  GLUCOSE 367* 323*  CALCIUM  8.7 9.1    CBC, diff: Recent Labs  Lab 08/20/19 0931 08/21/19 2256  WBC 10.8* 10.9*  HGB 13.0 12.5  HCT 39.2 36.9  MCV 92 93  PLT 397 366  NEUTCT 7.5 6.2  LYMPHCT 2.3 3.5  MONOCT 0.7 0.8  EOSCT 0.15 0.26  BASOCT 0.08 0.08    Urinalysis: Lab Results  Component Value Date   COLORU Yellow 06/11/2019   CLARITYU Clear 06/11/2019   SPECGRAV 1.015 06/11/2019   LABPH 7.0 06/11/2019   PROTEINUA Negative 06/11/2019   GLUCOSEU 2+ (!) 06/11/2019   KETONESU Negative 06/11/2019   BLOODU 3+ (!) 06/11/2019   NITRITE Negative 06/11/2019   LEUKOCYTESUR Negative 06/11/2019   BILIRUBINUR Negative 06/11/2019   UROBILINOGEN 0.2 06/11/2019    Urine Drug Screen:  Recent Labs    08/21/19 2050  AMPHETUR Negative  BARBURINE Negative  BENZOUR Negative  COCAINESCRN Negative  OPIATESUR Negative  THCURINE Negative    Serum Drug Screen: Recent Labs  Lab 08/20/19 0931 08/21/19 2050  ACETAMIN <10* <10*  SALICYLATE <40* <40*    Pregnancy: Lab Results  Component Value Date   POCHCG NEG 12/24/2011    EKG: No results for input(s): VENTRATE, PRINTERVAL, QRSINTERVAL, QTINTERVAL, QTC in the last 168 hours.  Liver Function: Recent Labs  Lab 08/21/19 2256  AST 16  ALT 17  TOTALPROTEIN 6.2  ALB 3.0*  TBILI 0.1*  ALKPHOS 76    Endocrine: Recent Labs  Lab 08/20/19 0931  TSH 6.39*  T4FREE 0.59     1:40 PM 08/23/2019   Attestation: I personally performed the service. (TP)  CHRISTINA EARNIE JABS, MD
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
## 2019-08-25 NOTE — Care Plan (Signed)
 Placed Diabetes Management/Endocrinology consult order upon arrival to [facility] due to insulin  pump removal while transitioning from TR to [facility] area of ED. Blood sugars in BH upon arrival have been in 300's. Pt currently drinking water. NP Shawnee Oar returned page to discuss insulin  management and ordered: 12 units of Lantus NOW, 4 units of Humalog with meals, and sliding scale.   [address] [place], [place] 08/25/2019

## 2019-08-25 NOTE — Progress Notes (Addendum)
 Pt referred to the following facilities, with the resulting response, no bed offer today 8/8.   [facility] Faxed  [facility] Faxed  [facility] Faxed  [facility] Faxed  [facility] Faxed  [facility] Faxed  [facility] Faxed  [facility] Faxed  [facility] Faxed  [facility] Faxed  [facility] Faxed  [facility] Faxed  [facility] Faxed  [facility] Faxed  [facility] Faxed  [facility] Faxed  [facility] Faxed   [place], [place]
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
## 2019-08-26 NOTE — Progress Notes (Signed)
 Psychiatry BH CM Note  Phone call placed to [employer] [facility] [phone]). Indicated that referral was not received. BHCM re-routed referral.   Damien Shoulder, MSW, LCSW, LCASA Behavioral Health Case Manager

## 2019-08-26 NOTE — Progress Notes (Signed)
 After completing this referral form:  Route to Psych Inpatient Referrals (search: p psych)  Send a text page to [facility] INTAKE [place] and include the patient's name, your name, and your callback number.    REFERRING FACILITY NAME:[facility]  REFERRING FACILITY LOCATION: Psychiatry ED Consults  PATIENT INFORMATION  Patient Name: Tracey Perkins     DOB: 1986/01/06    Age:  34 y.o. MRN: [id number]  PERSON COMPLETING THIS REFERRAL Name: DAPHNE GIBSON FORBES, PA  COVID-19 SCREENING QUESTIONS Patient Vitals for the past 24 hrs:  BP Temp Temp src Pulse Resp SpO2  08/25/19 2200 (!) 128/98 36.6 C (97.9 F) Oral 98 18 97 %  08/25/19 1924 (!) 134/100 36.7 C (98.1 F) -- (!) 112 18 96 %   Temperature of > 100.4 degrees within the last 12 hours of sending referral? no  (If no temperature has been taken within the last 12 hours it must be taken prior to sending this referral form.) In the last 14 days has the patient been in close contact with someone who was confirmed or suspected to have COVID-19, defined as being withing 6 feet for at least 10 minutes or having direct contact with infectious secretions of a COVID-19 case (e.g. being coughed on or sneezed on)? no Has the patient personally endorsed or clinically demonstrated the following signs and symptoms within the last 12 hours? Difficulty breathing no Cough no Sore throat no Headache no Stuffed up or runny nose no Wheezing no Limitation of activity due to chest tightness no Muscle pain no Diarrhea no Is there a known medical explanation for these symptoms? Not Applicable Has this patient had a COVID-19 laboratory test during this clinical encounter? no Lab Results  Component Value Date   SARSCOV2 Not Detected 08/20/2019     PRESENTING PROBLEM  Tracey Perkins a 34 y.o.femalew/ a PMH a recent concussion 05/2019 and childhood medulloblastoma s/p radiation therapywho was brought into the ED by family for  evaluation of behavioral change/mood dysregulation over past 3-4 weeks and onset of suicidal ideation.   PSYCHIATRIC DIAGNOSES (historical, provisional): Adjustment disorder with mixed disturbance of emotions and conduct; Impulse Control Disorder  PREVIOUS MENTAL HEALTH TREATMENT: Prior psychiatric admissions? no  Outpatient psychiatrist/NP/PA? no    Outpatient therapist? no  Current ACT or Community Support Team? no    SUBSTANCE USE HISTORY History of ETOH/drug use? no    If yes, what? N/A  Last use when? N/A   How much? N/A    History of complicated withdrawal? no   Previous substance use disorder treatment? no    SAFETY ISSUES Current suicidal ideation? no Current suicidal plan? no Past suicide attempts? no History of self-mutilation? no Current violent/homicidal Ideation? no Agitation in last 24 hours? no History of seclusion/restraint in last year? no Most Recent BROSET VIOLENCE CHECKLIST score (if available): Broset Violence Checklist Confused: No Irritable: No Boisterous: No Physically Threatening: No Verbally Threatening: No Attacking Objects: No Broset Violence Checklist Score: 0 Broset Violence Checklist Score: 0 (08/25/19 2200)  MEDICAL CARE Acute medical issues in last week? no Diabetic? yes Last blood sugar:  Lab Results  Component Value Date   POCGLU 335 (H) 08/26/2019   GLUCOSE 362 (H) 08/26/2019   Recent alteration in sensorium or confusion? no Is the patient in contact isolation?no   Any open wounds? no Active diarrhea? no History of communicable disease? no   Any need for respiratory assistance? no Past medical history significant for: Past Medical  History:  Diagnosis Date  . Chronic lymphocytic thyroiditis   . Hypokalemia   . Hypomagnesemia   . Medulloblastoma (CMS-HCC)   . Primary ovarian failure   . Status post radiation therapy   . Stroke (cerebrum) (CMS-HCC)    Per patient seen on last MRI  . Type I (juvenile type) diabetes  mellitus without mention of complication, uncontrolled (CMS-HCC) 04/13/2011    MEDICATIONS Medications given today: yes: aspirin ; atorvastatin ; insulin ; levothyroxine ; Sprintec ; has PRN: Vistaril 25 mg every 6 hours - last had 8/8 2020 Medications refused today: no medications refused today PRN Medications given in the last 24 hours: Medication Administration Report for Margaree, Sandhu as of 08/26/19 1247  Medications 08/25/19 08/26/19   albuterol  inhaler 2 inhalation Dose: 2 inhalation Freq: Every 6 hours PRN Route: Inhal PRN Reasons: Wheezing,Shortness of Breath,Bronchospasm,Cough Start: 08/24/19 1323       aluminum & magnesium  hydroxide-simethicone  (DUH/DRH) (MYLANTA MAXIMUM) 400-400-40 mg/5 mL suspension 15 mL Dose: 15 mL Freq: Every 6 hours PRN Route: Oral PRN Reasons: GI Upset,Indigestion,Heartburn Start: 08/24/19 2007       azelastine  (ASTELIN ) nasal spray 2 spray Dose: 2 spray Freq: Daily PRN Route: Both Nares PRN Reason: Rhinitis Start: 08/24/19 1324       dextrose  50% in water solution 12.5-25 g Dose: 12.5-25 g Freq: As Directed Route: IV PRN Comment: Per Hypoglycemia Protocol Start: 08/21/19 1827       glucagon  (GLUCAGEN) injection 1 mg Dose: 1 mg Freq: As Directed Route: IM PRN Reason: Low blood sugar PRN Comment: Per Hypoglycemia Protocol Start: 08/21/19 1827       hydrOXYzine pamoate (VISTARIL) capsule 25 mg Dose: 25 mg Freq: Every 6 hours PRN Route: Oral PRN Reason: Anxiety PRN Comment: or sleep Start: 08/23/19 1733   End: 09/22/19 1732   2020                  LABS WITHIN LAST 72 HOURS Complete Blood Count No results in the past 72 hrs Chemistry Within the last 72 hours  Lab Component 08/26/19 1143  SODIUM 131*  POTASSIUM 4.1  CHLORIDE 95*  CO2 27  BUN 13  CREATININE 1.1*  GLUCOSE 362*  CALCIUM  9.0   Urinalysis No results in the past 72 hrs No results in the past 72 hrs Urine Drug Screen  No results in the past 72 hrs Serum Drug  Screen  No results in the past 72 hrs Liver Function No results in the past 72 hrs No results in the past 72 hrs  Invalid input(s):  INR Medication Levels No results in the past 72 hrs  Pregnancy No results in the past 72 hrs Endocrine No results in the past 72 hrs No results in the past 72 hrs Cardiac Biomarkers No results in the past 72 hrs  SENSORIUM & COGNITION Visually impaired? no    Hearing impaired? no   Diagnosed with dementia? no   Delirious? no   Intellectual disability? unknown    LIVING SITUATION: lives with others: parents  SELF-CARE  Ambulatory? walks independently Needs assistance to walk 24ft? no History of falls/injuries in the last month? no  Feeds self? yes   Dresses self? yes   [place] self? yes    Incontinent?: no problems with incontinence SPECIAL CARE NEEDS Fluent ONLY in a language other than English? no Physical care needs? no Special cultural/religious needs? no Gender identity concerns that will impact room assignment? no  DISCHARGE PLANNING Will the patient return to previous living arrangement? yes Legal:  power of attorney: parents  I attest that the information in this referral was obtained within the past 2 hours and the person completing the clinical assessment was: Myself  DAPHNE HENDRICKS FAM, PA _______________________________ Name of person completing referral

## 2019-08-27 ENCOUNTER — Encounter: Payer: Medicaid Other | Admitting: Physical Therapy

## 2019-08-27 DIAGNOSIS — F39 Unspecified mood [affective] disorder: Secondary | ICD-10-CM | POA: Insufficient documentation

## 2019-08-27 NOTE — Progress Notes (Signed)
 PATIENT ACCEPTED FOR TRANSFER TO:  ([facility]IP) BY:  Nursing staff  ON BEHALF OF:  Rodena Jabs, MD)  CALL REPORT TO: [phone] EMTALA:  (no, does not need to be done for transfers to [facility] BHIP) TRANSPORTATION CALLED?:  No  FAMILY/CONTACT NOTIFIED:  No

## 2019-08-30 ENCOUNTER — Ambulatory Visit: Payer: Medicaid Other | Admitting: Physical Therapy

## 2019-09-04 ENCOUNTER — Other Ambulatory Visit: Payer: Self-pay

## 2019-09-04 ENCOUNTER — Ambulatory Visit (INDEPENDENT_AMBULATORY_CARE_PROVIDER_SITE_OTHER): Payer: Medicaid Other | Admitting: Psychiatry

## 2019-09-04 ENCOUNTER — Other Ambulatory Visit: Payer: Self-pay | Admitting: Family Medicine

## 2019-09-04 ENCOUNTER — Encounter (HOSPITAL_COMMUNITY): Payer: Self-pay | Admitting: Psychiatry

## 2019-09-04 DIAGNOSIS — F330 Major depressive disorder, recurrent, mild: Secondary | ICD-10-CM | POA: Diagnosis not present

## 2019-09-04 DIAGNOSIS — F419 Anxiety disorder, unspecified: Secondary | ICD-10-CM | POA: Diagnosis not present

## 2019-09-04 MED ORDER — CITALOPRAM HYDROBROMIDE 10 MG PO TABS: 10.0000 mg | ORAL_TABLET | Freq: Every day | ORAL | 2 refills | Status: DC
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
## 2019-09-06 ENCOUNTER — Ambulatory Visit: Payer: Medicaid Other | Admitting: Physical Therapy

## 2019-09-06 ENCOUNTER — Other Ambulatory Visit: Payer: Self-pay

## 2019-09-06 ENCOUNTER — Encounter: Payer: Self-pay | Admitting: Physical Therapy

## 2019-09-06 DIAGNOSIS — R2681 Unsteadiness on feet: Secondary | ICD-10-CM

## 2019-09-06 DIAGNOSIS — R296 Repeated falls: Secondary | ICD-10-CM | POA: Diagnosis present

## 2019-09-06 DIAGNOSIS — R42 Dizziness and giddiness: Secondary | ICD-10-CM | POA: Diagnosis present

## 2019-09-06 DIAGNOSIS — M6281 Muscle weakness (generalized): Secondary | ICD-10-CM

## 2019-09-06 DIAGNOSIS — R2689 Other abnormalities of gait and mobility: Secondary | ICD-10-CM

## 2019-09-06 DIAGNOSIS — R209 Unspecified disturbances of skin sensation: Secondary | ICD-10-CM

## 2019-09-06 NOTE — Therapy (Signed)
[facility] [address] [place], [name], [address] Phone: [phone]   Fax:  [phone]  Physical Therapy Treatment  Patient Details  Name: Tracey Perkins MRN: [id number] Date of Birth: 05-Aug-1985 Referring Provider (PT): Erin Sons, MD   Encounter Date: 09/06/2019   PT End of Session - 09/06/19 1203    Visit Number 5    Number of Visits 29    Date for PT Re-Evaluation 09/26/19    Authorization Type Approved for 24 visits 7/29 - 9/9    Authorization - Visit Number 1    Authorization - Number of Visits 24    PT Start Time 1104    PT Stop Time 1150    PT Time Calculation (min) 46 min    Equipment Utilized During Treatment --   min A to S prn   Activity Tolerance Patient tolerated treatment well    Behavior During Therapy [facility] for tasks assessed/performed           Past Medical History:  Diagnosis Date  . Asthma   . Brain tumor ([place])   . Diabetes mellitus without complication ([place])   . Eczema   . Food allergy   . Gastroparesis   . Hypokalemia   . Hypomagnesemia   . Hypothyroid   . Neuropathy   . Thyroid disease   . Vision changes     Past Surgical History:  Procedure Laterality Date  . BRAIN SURGERY    . Portacath placement and removed    . WISDOM TOOTH EXTRACTION      There were no vitals filed for this visit.   Subjective Assessment - 09/06/19 1112    Subjective Pt walking without walker or assistance today, able to turn head and talk to therapist without LOB.  Pt had been in the hospital over the past few weeks and while in the hospital she felt like she remembered how to walk again.  Is feeling better.  Hasn't tried tap dancing since being home from hospital.  No dizziness, no headaches, no falls since D/C from hospital, neck is feeling better.    Patient is accompained by: Family member    Pertinent History asthma, eczema, food allergy, polyneuropathy, uncontrolled type 1  diabetes, gastroparesis, h/o chronic L thalamic infarct, new R thalamic infarct since 2016, lymphocytic thyroiditis, s/p radiation therapy, chemo and 2 surgical resection for medulloblastoma, childhood, nystagmus, visual field defect, sensorineural hearing loss of bilat ears    Patient Stated Goals Getting back to walking independently and improve balance.  Going back to the gym and use the treadmill.    Currently in Pain? No/denies              [facility] PT Assessment - 09/06/19 1119      Dynamic Gait Index   Level Surface Mild Impairment    Change in Gait Speed Mild Impairment    Gait with Horizontal Head Turns Mild Impairment    Gait with Vertical Head Turns Mild Impairment    Gait and Pivot Turn Normal    Step Over Obstacle Mild Impairment    Step Around Obstacles Mild Impairment    Steps Mild Impairment    Total Score 17    DGI comment: 17/24 improved from 8/24               Vestibular Assessment - 09/06/19 1131      Balancemaster   Balancemaster Comment MCTSIB: 30 seconds condition 1; 28 seconds condition 2.  Still  not able to do condition 3/4      Positional Sensitivities   Sit to Supine No dizziness    Supine to Left Side No dizziness    Supine to Right Side Mild dizziness    Supine to Sitting Mild dizziness    Nose to Right Knee Mild dizziness    Right Knee to Sitting Mild dizziness    Nose to Left Knee Mild dizziness    Left Knee to Sitting Mild dizziness    Head Turning x 5 Mild dizziness   nystagmus   Head Nodding x 5 Mild dizziness   nystagmus   Pivot Right in Standing Mild dizziness   nystagmus   Pivot Left in Standing Mild dizziness    Rolling Right Mild dizziness    Rolling Left No dizziness                            PT Education - 09/06/19 1202    Education Details reassessment after hospitalization, progress made, plan for next visit    Person(s) Educated Patient;Parent(s)    Methods Explanation    Comprehension Verbalized  understanding            PT Short Term Goals - 09/06/19 1208      PT SHORT TERM GOAL #1   Title = LTG             PT Long Term Goals - 09/06/19 1117      PT LONG TERM GOAL #1   Title Patient will demonstrate ability to perform final HEP independently, walk on treadmill at home with supervision and return to the gym for ongoing wellness    Baseline HEP with supervision, not using treadmill, not going to gym    Time --    Period --    Status Revised    Target Date 09/26/19      PT LONG TERM GOAL #2   Title Pt will improve DGI without AD to >/= 21/24 points to indicate decreased falls risk    Baseline 17/24 supervision without AD    Time --    Period --    Status Revised    Target Date 09/26/19      PT LONG TERM GOAL #3   Title Pt will demonstrate ability to perform conditions 1/2 on MCTSIB for 30 seconds and perform condition 3/4 for up to 10 seconds to indicate improved sensory integration    Baseline 30, 28, not able to perform 3 and 4    Time --    Period --    Status Revised    Target Date 09/26/19      PT LONG TERM GOAL #4   Title Pt will report no dizziness or headaches with bed mobility, transfers, head/body turns, bending down to the floor or looking up at the ceiling to decrease falls risk with ADL and mobility    Baseline mild dizziness with bed mobility, transfers and head/body turns during gait    Time --    Period --    Status On-going    Target Date 09/26/19      PT LONG TERM GOAL #5   Title Pt will be able to sit down in tub and stand back up with supervision; pt will be able to perform shower in stand up shower with supervision only without LOB to increase independence with ADL    Baseline walking without RW but still needs assistance to get  down into and out of the tub (all the way down)    Time --    Period Weeks    Status Revised    Target Date 09/26/19                 Plan - 09/06/19 1206    Clinical Impression Statement Pt has not  been able to attend therapy sessions over the past few weeks due to hospitalization; despite hospitalization and not being able to attend therapy or perform HEP pt demonstrates significant improvement in standing balance and gait with improvement in DGI from 8 > 17/24.  Pt continues to have increased difficulty with balance on compliant surfaces and with eyes closed as indicated by MCTSIB and continues to experience motion sensitivity and impaired gaze stability.  Will continue to address and progress towards patient's goals.    Personal Factors and Comorbidities Comorbidity 3+;Finances;Fitness;Past/Current Experience    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, childhood, nystagmus, visual field defect, sensorineural hearing loss of bilat ears    Examination-Activity Limitations Bathing;Bend;Dressing;Locomotion Level;Stairs;Stand;Reach Overhead    Examination-Participation Restrictions Community Activity;School    Stability/Clinical Decision Making Evolving/Moderate complexity    Rehab Potential Good    PT Frequency 2x / week    PT Duration 6 weeks    PT Treatment/Interventions ADLs/Self Care Home Management;Aquatic Therapy;Canalith Repostioning;DME Instruction;Gait training;Stair training;Functional mobility training;Patient/family education;Therapeutic activities;Orthotic Fit/Training;Therapeutic exercise;Balance training;Neuromuscular re-education;Vestibular    PT Next Visit Plan goals updated; progress HEP focusing on gaze stability, habituation, balance with eyes closed, compliant surfaces.  Getting down into and out of bathtub at home.  Being able to stand safely in the shower.    Consulted and Agree with Plan of Care Patient;Family member/caregiver    Family Member  Consulted parents           Patient will benefit from skilled therapeutic intervention in order to improve the following deficits and impairments:  Abnormal gait, Decreased coordination, Difficulty walking, Dizziness, Decreased activity tolerance, Decreased balance, Decreased strength, Impaired sensation, Pain  Visit Diagnosis: Other abnormalities of gait and mobility  Dizziness and giddiness  Unsteadiness on feet  Muscle weakness (generalized)  Repeated falls  Unspecified disturbances of skin sensation     Problem List Patient Active Problem List   Diagnosis Date Noted  . Mild episode of recurrent major depressive disorder ([place]) 09/04/2019  . Anxiety 08/16/2019  . 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  . 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 Status post radiation therapy 11/22/2018  . Seasonal and perennial allergic rhinitis 11/22/2018  . Seasonal allergic conjunctivitis 11/22/2018  . Acute sinusitis 11/22/2018  . Keratosis pilaris 10/05/2017  . Melanocytic nevi of trunk 10/05/2017  . Adverse food reaction 02/09/2017  . Gastroesophageal reflux disease 02/09/2017  . Mild intermittent asthma with acute exacerbation 11/19/2015  . Mild persistent asthma without complication 37/85/8850  . Acute seasonal allergic rhinitis due to pollen 11/16/2015  . Anaphylactic shock due to adverse food reaction 11/16/2015  . 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 Type 1 diabetes mellitus without complication ([place]) 27/74/1287  . Dry mouth 11/16/2015  . Xerosis cutis 11/16/2015  . Nystagmus 08/15/2014  . Visual field defect 08/15/2014  . Disequilibrium 07/25/2014  . 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    Rico Junker, PT, DPT 09/06/19    12:14 PM    [place]  [facility] [address] [place], [name], [address] Phone: [phone]   Fax:  [phone]  Name: Tracey Perkins MRN: [id number] Date of Birth:  [birth year]

## 2019-09-09 ENCOUNTER — Other Ambulatory Visit: Payer: Self-pay

## 2019-09-09 ENCOUNTER — Encounter: Payer: Self-pay | Admitting: Physical Therapy

## 2019-09-09 ENCOUNTER — Ambulatory Visit: Payer: Medicaid Other | Admitting: Physical Therapy

## 2019-09-09 DIAGNOSIS — R2689 Other abnormalities of gait and mobility: Secondary | ICD-10-CM

## 2019-09-09 DIAGNOSIS — M6281 Muscle weakness (generalized): Secondary | ICD-10-CM

## 2019-09-09 DIAGNOSIS — R296 Repeated falls: Secondary | ICD-10-CM

## 2019-09-09 DIAGNOSIS — R209 Unspecified disturbances of skin sensation: Secondary | ICD-10-CM

## 2019-09-09 DIAGNOSIS — R42 Dizziness and giddiness: Secondary | ICD-10-CM

## 2019-09-09 DIAGNOSIS — R2681 Unsteadiness on feet: Secondary | ICD-10-CM

## 2019-09-09 NOTE — Patient Instructions (Addendum)
Gaze Stabilization: Sitting    Keeping eyes on target on wall 3 feet away, and move head side to side slowly for 8-10 repetitions. Do __2-3__ sessions per day.   Feet Together, Head Motion - Eyes Open    Chair behind you and chair or counter in front for support/safety.  With eyes open, feet together, move head slowly: up and down 5 repetitions, Perform head turns to the left and right 5 repetitions.. Repeat _2_ times per session.    Bending / Picking Up Objects    Sitting, place your water bottle in front of your feet.  Look down and keep your eyes focused on the top of your water bottle, slowly bend head down and pick up object on the floor. Return to upright position keeping your eyes on the water bottle.  Hold position until symptoms subside. Repeat __5__ times per session. Do __2__ sessions per day.

## 2019-09-09 NOTE — Therapy (Signed)
[facility] [address] [place], [name], [address] Phone: [phone]   Fax:  [phone]  Physical Therapy Treatment  Patient Details  Name: Tracey Perkins MRN: [id number] Date of Birth: December 27, 1985 Referring Provider (PT): Erin Sons, MD   Encounter Date: 09/09/2019   PT End of Session - 09/09/19 1253    Visit Number 6    Number of Visits 29    Date for PT Re-Evaluation 09/26/19    Authorization Type Approved for 24 visits 7/29 - 9/9    Authorization - Visit Number 2    Authorization - Number of Visits 24    PT Start Time 1102    PT Stop Time 1149    PT Time Calculation (min) 47 min    Equipment Utilized During Treatment --   min A to S prn   Activity Tolerance Patient tolerated treatment well    Behavior During Therapy [facility] for tasks assessed/performed           Past Medical History:  Diagnosis Date  . Asthma   . Brain tumor ([place])   . Diabetes mellitus without complication ([place])   . Eczema   . Food allergy   . Gastroparesis   . Hypokalemia   . Hypomagnesemia   . Hypothyroid   . Neuropathy   . Thyroid disease   . Vision changes     Past Surgical History:  Procedure Laterality Date  . BRAIN SURGERY    . Portacath placement and removed    . WISDOM TOOTH EXTRACTION      There were no vitals filed for this visit.   Subjective Assessment - 09/09/19 1105    Subjective Pt reports before concussion started she started having temperature and sensory changes in feet at night but it is getting more intense now.  Has to wear fuzzy socks because regular socks feel heavy/tight.  Gets clumsier at night.  Is having more hot flashes as well.    Patient is accompained by: Family member    Pertinent History asthma, eczema, food allergy, polyneuropathy, uncontrolled type 1 diabetes, gastroparesis, h/o chronic L thalamic infarct, new R thalamic infarct since 2016, lymphocytic thyroiditis, s/p radiation  therapy, chemo and 2 surgical resection for medulloblastoma, childhood, nystagmus, visual field defect, sensorineural hearing loss of bilat ears    Patient Stated Goals Getting back to walking independently and improve balance.  Going back to the gym and use the treadmill.                              Vestibular Treatment/Exercise - 09/09/19 1129      Vestibular Treatment/Exercise   Vestibular Treatment Provided Gaze;Habituation    Habituation Exercises Comment;Seated Vertical Head Turns    Gaze Exercises X1 Viewing Horizontal;X1 Viewing Vertical      Seated Vertical Head Turns   Number of Reps  5    Symptom Description  combined with bending down to the ground to reach for and pick up water bottle and put back down on ground; focused on using spotting to keep eyes focused on the target when bending down and coming back upright to improve stability and minimize symptoms      X1 Viewing Horizontal   Foot Position seated with back support, patch on glasses due to diplopia    Reps 2    Comments 30 seconds each side, moderate symptoms when performing with R eye (L eye covered)  X1 Viewing Vertical   Foot Position seated with back support, patch on glasses due to diplopia    Reps 2    Comments 30 seconds each side, mild symptoms              Balance Exercises - 09/09/19 1249      Balance Exercises: Standing   Standing Eyes Opened Narrow base of support (BOS);Head turns;Solid surface;5 reps;Limitations    Standing Eyes Opened Limitations Feet together; chair behind and in front of patient but no UE support             PT Education - 09/09/19 1251    Education Details updated HEP, use of patch when having vertical diplopia, contact physician about LE symptoms at night    Person(s) Educated Patient;Parent(s)    Methods Explanation;Demonstration;Handout    Comprehension Verbalized understanding;Returned demonstration            PT Short Term Goals -  09/06/19 1208      PT SHORT TERM GOAL #1   Title = LTG             PT Long Term Goals - 09/06/19 1117      PT LONG TERM GOAL #1   Title Patient will demonstrate ability to perform final HEP independently, walk on treadmill at home with supervision and return to the gym for ongoing wellness    Baseline HEP with supervision, not using treadmill, not going to gym    Time --    Period --    Status Revised    Target Date 09/26/19      PT LONG TERM GOAL #2   Title Pt will improve DGI without AD to >/= 21/24 points to indicate decreased falls risk    Baseline 17/24 supervision without AD    Time --    Period --    Status Revised    Target Date 09/26/19      PT LONG TERM GOAL #3   Title Pt will demonstrate ability to perform conditions 1/2 on MCTSIB for 30 seconds and perform condition 3/4 for up to 10 seconds to indicate improved sensory integration    Baseline 30, 28, not able to perform 3 and 4    Time --    Period --    Status Revised    Target Date 09/26/19      PT LONG TERM GOAL #4   Title Pt will report no dizziness or headaches with bed mobility, transfers, head/body turns, bending down to the floor or looking up at the ceiling to decrease falls risk with ADL and mobility    Baseline mild dizziness with bed mobility, transfers and head/body turns during gait    Time --    Period --    Status On-going    Target Date 09/26/19      PT LONG TERM GOAL #5   Title Pt will be able to sit down in tub and stand back up with supervision; pt will be able to perform shower in stand up shower with supervision only without LOB to increase independence with ADL    Baseline walking without RW but still needs assistance to get down into and out of the tub (all the way down)    Time --    Period Weeks    Status Revised    Target Date 09/26/19                 Plan - 09/09/19 1254  Clinical Impression Statement Updated HEP today due to patient's progress; pt experiencing  vertical diplopia with horizontal and vertical x1 viewing so provided pt with patch to wear over glasses to allow pt to perform without diplopia.  Removed cervical exercises and added habituation for bending down to the ground.  No change to standing balance exercise today.  Will continue to address and progress towards LTG.    Personal Factors and Comorbidities Comorbidity 3+;Finances;Fitness;Past/Current Experience    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, childhood, nystagmus, visual field defect, sensorineural hearing loss of bilat ears    Examination-Activity Limitations Bathing;Bend;Dressing;Locomotion Level;Stairs;Stand;Reach Overhead    Examination-Participation Restrictions Community Activity;School    Stability/Clinical Decision Making Evolving/Moderate complexity    Rehab Potential Good    PT Frequency 2x / week    PT Duration 6 weeks    PT Treatment/Interventions ADLs/Self Care Home Management;Aquatic Therapy;Canalith Repostioning;DME Instruction;Gait training;Stair training;Functional mobility training;Patient/family education;Therapeutic activities;Orthotic Fit/Training;Therapeutic exercise;Balance training;Neuromuscular re-education;Vestibular    PT Next Visit Plan progress HEP - x1, habituation bending down, balance with eyes closed, compliant surfaces.  Getting down into and out of bathtub at home.  Being able to stand safely in the shower.    Consulted and Agree with Plan of Care Patient;Family member/caregiver    Family Member Consulted parents           Patient will benefit from skilled therapeutic intervention in order to improve the following deficits and impairments:  Abnormal gait, Decreased coordination, Difficulty walking, Dizziness, Decreased  activity tolerance, Decreased balance, Decreased strength, Impaired sensation, Pain  Visit Diagnosis: Other abnormalities of gait and mobility  Dizziness and giddiness  Unsteadiness on feet  Muscle weakness (generalized)  Repeated falls  Unspecified disturbances of skin sensation     Problem List Patient Active Problem List   Diagnosis Date Noted  . Mild episode of recurrent major depressive disorder ([place]) 09/04/2019  . Anxiety 08/16/2019  . 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  . 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 Status post radiation therapy 11/22/2018  . Seasonal and perennial allergic rhinitis 11/22/2018  . Seasonal allergic conjunctivitis 11/22/2018  . Acute sinusitis 11/22/2018  . Keratosis pilaris 10/05/2017  . Melanocytic nevi of trunk 10/05/2017  . Adverse food reaction 02/09/2017  . Gastroesophageal reflux disease 02/09/2017  . Mild intermittent asthma with acute exacerbation 11/19/2015  . Mild persistent asthma without complication 16/10/9602  . Acute seasonal allergic rhinitis due to pollen 11/16/2015  . Anaphylactic shock due to adverse food reaction 11/16/2015  . 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 Type 1 diabetes mellitus without complication ([place]) 54/09/8117  . Dry mouth 11/16/2015  . Xerosis cutis 11/16/2015  . Nystagmus 08/15/2014  . Visual field defect 08/15/2014  . Disequilibrium 07/25/2014  . 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    Rico Junker, PT, DPT 09/09/19    12:57 PM    [facility] [address] [place], [name], [address] Phone: [phone]   Fax:  [phone]  Name: BRIGHID KOCH MRN: [id number] Date of Birth: 1985/03/31

## 2019-09-12 ENCOUNTER — Other Ambulatory Visit: Payer: Self-pay

## 2019-09-12 ENCOUNTER — Ambulatory Visit: Payer: Medicaid Other | Admitting: Physical Therapy

## 2019-09-12 ENCOUNTER — Encounter: Payer: Self-pay | Admitting: Physical Therapy

## 2019-09-12 DIAGNOSIS — R2689 Other abnormalities of gait and mobility: Secondary | ICD-10-CM | POA: Diagnosis not present

## 2019-09-12 DIAGNOSIS — M6281 Muscle weakness (generalized): Secondary | ICD-10-CM

## 2019-09-12 DIAGNOSIS — R42 Dizziness and giddiness: Secondary | ICD-10-CM

## 2019-09-12 DIAGNOSIS — R2681 Unsteadiness on feet: Secondary | ICD-10-CM

## 2019-09-13 NOTE — Therapy (Signed)
[facility] [address] [place], [name], [address] Phone: [phone]   Fax:  [phone]  Physical Therapy Treatment  Patient Details  Name: Tracey Perkins MRN: [id number] Date of Birth: Feb 16, 1985 Referring Provider (PT): Erin Sons, MD   Encounter Date: 09/12/2019   PT End of Session - 09/13/19 1030    Visit Number 7    Number of Visits 29    Date for PT Re-Evaluation 09/26/19    Authorization Type Approved for 24 visits 7/29 - 9/9    Authorization Time Period 3    Authorization - Number of Visits 24    PT Start Time 1147    PT Stop Time 1230    PT Time Calculation (min) 43 min    Equipment Utilized During Treatment --   min A to S prn   Activity Tolerance Patient tolerated treatment well    Behavior During Therapy [facility] for tasks assessed/performed           Past Medical History:  Diagnosis Date  . Asthma   . Brain tumor ([place])   . Diabetes mellitus without complication ([place])   . Eczema   . Food allergy   . Gastroparesis   . Hypokalemia   . Hypomagnesemia   . Hypothyroid   . Neuropathy   . Thyroid disease   . Vision changes     Past Surgical History:  Procedure Laterality Date  . BRAIN SURGERY    . Portacath placement and removed    . WISDOM TOOTH EXTRACTION      There were no vitals filed for this visit.   Subjective Assessment - 09/12/19 1149    Subjective Pt states no changes since last visit - doing exercises at home; states she feels they are helping some - not having as much difficulty turning head to right or left sides in the grocery store;  pt states she wants to work on her leg strength - says her father has to help her get up out of the bathtub    Pertinent History asthma, eczema, food allergy, polyneuropathy, uncontrolled type 1 diabetes, gastroparesis, h/o chronic L thalamic infarct, new R thalamic infarct since 2016, lymphocytic thyroiditis, s/p radiation therapy, chemo  and 2 surgical resection for medulloblastoma, childhood, nystagmus, visual field defect, sensorineural hearing loss of bilat ears    Patient Stated Goals Getting back to walking independently and improve balance.  Going back to the gym and use the treadmill.    Currently in Pain? No/denies                   TherAct; pt performed tall kneeling on mat on floor -partial squats back onto heels with cues to squeeze gluts during transition Back to tall kneeling - 10 reps  Tall kneeling - head turns horizontal and vertical 5 reps each with UE support prn on mat in front of her  Pt performed transitional movement of moving each leg from tall kneeling to 1/2 position and back to tall kneeling for increased balance and  Trunk control and hip stabilization on stance leg in this position -- pt needed UE support for balance during transitional movement          [facility] Adult PT Treatment/Exercise - 09/13/19 0001      Transfers   Transfers Sit to Stand;Stand Pivot Transfers    Comments 5 reps with no UE support - feet on red mat with SBA; pt initially braced legs against mat table  for stabilization - instructed pt to move forward and try not to touch mat upon standing for more challenge with balance      Neuro Re-ed    Neuro Re-ed Details  Pt performed sit to stand with step and pivot turn 5 reps to Rt and Lt sides for habituation of dizziness with turning; CGA for safety; pt had more unsteadiness with turning to Lt side with this exercise;  pt performed habituation exercise of picking up cones (4) off floor and turniing 180 degrees to place on mat table behind her - CGA for safety      Exercises   Exercises Knee/Hip      Knee/Hip Exercises: Machines for Strengthening   Cybex Leg Press 80# 3 sets 10 reps; 90# 2 sets 10 reps           Vestibular Treatment/Exercise - 09/13/19 0001      Vestibular Treatment/Exercise   Vestibular Treatment Provided Gaze      X1 Viewing Horizontal    Foot Position bil. stance - pt stood 5' away from letter on plain door with UE suppor on chair in front     Reps 1    Comments 30 secs      X1 Viewing Vertical   Foot Position bil. stance - UE support on chair in front for support     Reps 1    Comments 30 secs              Balance Exercises - 09/13/19 0001      Balance Exercises: Standing   Standing Eyes Opened Narrow base of support (BOS);Wide (BOA);Head turns;Foam/compliant surface;5 reps   horizontal and vertical head turns 5 reps each   Standing Eyes Opened Limitations performed in corner with PT in front for safety - instructed to decreased speed of head turns for less diiiculty     Standing Eyes Closed Narrow base of support (BOS);Wide (BOA);Head turns;Foam/compliant surface;5 reps    Marching Static;10 reps   in corner - on 2 pillows - with CGA    Other Standing Exercises slow marching on pillows with slow head turns with CGA to min assist for recovery of LOB             PT Education - 09/13/19 1028    Education Details instructed pt to continue x1 viewing in seated position at home (for HEP) and to continue to wear patch when having diplopia (pt did not have patch with her today)    Person(s) Educated Patient    Methods Explanation    Comprehension Verbalized understanding            PT Short Term Goals - 09/13/19 1040      PT SHORT TERM GOAL #1   Title = LTG             PT Long Term Goals - 09/13/19 1040      PT LONG TERM GOAL #1   Title Patient will demonstrate ability to perform final HEP independently, walk on treadmill at home with supervision and return to the gym for ongoing wellness    Baseline HEP with supervision, not using treadmill, not going to gym    Status Revised      PT LONG TERM GOAL #2   Title Pt will improve DGI without AD to >/= 21/24 points to indicate decreased falls risk    Baseline 17/24 supervision without AD    Status Revised      PT LONG TERM  GOAL #3   Title Pt will  demonstrate ability to perform conditions 1/2 on MCTSIB for 30 seconds and perform condition 3/4 for up to 10 seconds to indicate improved sensory integration    Baseline 30, 28, not able to perform 3 and 4    Status Revised      PT LONG TERM GOAL #4   Title Pt will report no dizziness or headaches with bed mobility, transfers, head/body turns, bending down to the floor or looking up at the ceiling to decrease falls risk with ADL and mobility    Baseline mild dizziness with bed mobility, transfers and head/body turns during gait    Status On-going      PT LONG TERM GOAL #5   Title Pt will be able to sit down in tub and stand back up with supervision; pt will be able to perform shower in stand up shower with supervision only without LOB to increase independence with ADL    Baseline walking without RW but still needs assistance to get down into and out of the tub (all the way down)    Period Weeks    Status Revised                 Plan - 09/13/19 1032    Clinical Impression Statement Pt demonstrated some unsteadiness with standing on compliant surfaces with feet together with EC, indicative of decreased vestibular function.  Pt c/o mild diplopia with performing x1 viewing in standing but had no LOB (no patch used as pt did not have it with her).  Pt c/o fatigued with activity and strengthening exercises - requires frequent rest periods on mat (pt lied back due to c/o fatigue).  Will continue to address balance and strength deficits.    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, childhood, nystagmus, visual field defect, sensorineural hearing loss of bilat ears    Rehab Potential Good    PT Frequency 2x / week    PT Duration 6 weeks    PT Treatment/Interventions  ADLs/Self Care Home Management;Aquatic Therapy;Canalith Repostioning;DME Instruction;Gait training;Stair training;Functional mobility training;Patient/family education;Therapeutic activities;Orthotic Fit/Training;Therapeutic exercise;Balance training;Neuromuscular re-education;Vestibular    PT Next Visit Plan Continue balance and habituation exercises - progress x1 viewing to standing for HEP? Cont with LE strengthening - leg press, tall kneeling for core stabilization - Getting down into and out of bathtub at home.  Being able to stand safely in the shower.           Patient will benefit from skilled therapeutic intervention in order to improve the following deficits and impairments:  Abnormal gait, Decreased coordination, Difficulty walking, Dizziness, Decreased activity tolerance, Decreased balance, Decreased strength, Impaired sensation, Pain  Visit Diagnosis: Other abnormalities of gait and mobility  Dizziness and giddiness  Unsteadiness on feet  Muscle weakness (generalized)     Problem List Patient Active Problem List   Diagnosis Date Noted  . Mild episode of recurrent major depressive disorder ([place]) 09/04/2019  . Anxiety 08/16/2019  . 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  . 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 Status post radiation therapy 11/22/2018  . Seasonal and perennial allergic rhinitis 11/22/2018  . Seasonal allergic conjunctivitis 11/22/2018  . Acute sinusitis 11/22/2018  . Keratosis pilaris 10/05/2017  . Melanocytic nevi of trunk 10/05/2017  . Adverse food reaction 02/09/2017  . Gastroesophageal reflux  disease 02/09/2017  . Mild intermittent asthma with acute exacerbation 11/19/2015  . Mild persistent asthma without complication 02/72/5366  . Acute seasonal allergic rhinitis due to pollen 11/16/2015  . Anaphylactic shock due to adverse food reaction 11/16/2015  . 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 Type 1 diabetes mellitus without complication ([place])  44/03/4740  . Dry mouth 11/16/2015  . Xerosis cutis 11/16/2015  . Nystagmus 08/15/2014  . Visual field defect 08/15/2014  . Disequilibrium 07/25/2014  . 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    Torrance Frech, [place], PT 09/13/2019, 10:42 AM  [facility] [address] [place], [name], [address] Phone: [phone]   Fax:  [phone]  Name: Tracey Perkins MRN: [id number] Date of Birth: [birth year]

## 2019-09-16 ENCOUNTER — Encounter: Payer: Self-pay | Admitting: Physical Therapy

## 2019-09-16 ENCOUNTER — Other Ambulatory Visit: Payer: Self-pay

## 2019-09-16 ENCOUNTER — Ambulatory Visit: Payer: Medicaid Other | Admitting: Physical Therapy

## 2019-09-16 DIAGNOSIS — M6281 Muscle weakness (generalized): Secondary | ICD-10-CM

## 2019-09-16 DIAGNOSIS — R2689 Other abnormalities of gait and mobility: Secondary | ICD-10-CM

## 2019-09-16 DIAGNOSIS — R42 Dizziness and giddiness: Secondary | ICD-10-CM

## 2019-09-16 DIAGNOSIS — R2681 Unsteadiness on feet: Secondary | ICD-10-CM

## 2019-09-16 DIAGNOSIS — R296 Repeated falls: Secondary | ICD-10-CM

## 2019-09-16 NOTE — Patient Instructions (Signed)
Gaze Stabilization: Sitting    Keeping eyes on target on wall 3 feet away, and move head side to side slowly for 8-10 repetitions. Do __2-3__ sessions per day.   Feet Together, Head Motion - Eyes Open    Chair behind you and chair or counter in front for support/safety.  With eyes open, feet together, move head slowly: up and down 5 repetitions, Perform head turns to the left and right 5 repetitions.. Repeat _2_ times per session.    Bending / Picking Up Objects    Sitting, place your water bottle in front of your feet.  Look down and keep your eyes focused on the top of your water bottle, slowly bend head down and pick up object on the floor. Return to upright position keeping your eyes on the water bottle.  Hold position until symptoms subside. Repeat __5__ times per session. Do __2__ sessions per day.   Push-Up (Sitting)    With a __4__ inch book under each hand, press down while lifting body - keep shoulders down. Use foot to help with balance but don't push up with legs. Hold __3-4__ seconds. Repeat ___5_ times. Do __2__ sessions per day.   EXTENSION: Standing - Resistance Band: Stable (Active)    Stand, right arm at side. Against red resistance band, draw arm backward, as far as possible, keeping elbow straight. Complete _2__ sets of __10_ repetitions.

## 2019-09-16 NOTE — Therapy (Signed)
[facility] [address] [place], [name], [address] Phone: [phone]   Fax:  [phone]  Physical Therapy Treatment  Patient Details  Name: Tracey Perkins MRN: [id number] Date of Birth: 12/31/1985 Referring Provider (PT): Erin Sons, MD   Encounter Date: 09/16/2019   PT End of Session - 09/16/19 1429    Visit Number 8    Number of Visits 29    Date for PT Re-Evaluation 09/26/19    Authorization Type Approved for 24 visits 7/29 - 9/9    Authorization - Visit Number 4    Authorization - Number of Visits 24    PT Start Time 6962    PT Stop Time 1230    PT Time Calculation (min) 45 min    Equipment Utilized During Treatment --   min A to S prn   Activity Tolerance Patient tolerated treatment well    Behavior During Therapy [facility] for tasks assessed/performed           Past Medical History:  Diagnosis Date  . Asthma   . Brain tumor ([place])   . Diabetes mellitus without complication ([place])   . Eczema   . Food allergy   . Gastroparesis   . Hypokalemia   . Hypomagnesemia   . Hypothyroid   . Neuropathy   . Thyroid disease   . Vision changes     Past Surgical History:  Procedure Laterality Date  . BRAIN SURGERY    . Portacath placement and removed    . WISDOM TOOTH EXTRACTION      There were no vitals filed for this visit.   Subjective Assessment - 09/16/19 1149    Subjective Went to see a movie this weekend and then went to a sister's graduation.  Exercises are going well with the patch.   Feels like most of her weakness in her arms.    Pertinent History asthma, eczema, food allergy, polyneuropathy, uncontrolled type 1 diabetes, gastroparesis, h/o chronic L thalamic infarct, new R thalamic infarct since 2016, lymphocytic thyroiditis, s/p radiation therapy, chemo and 2 surgical resection for medulloblastoma, childhood, nystagmus, visual field defect, sensorineural hearing loss of bilat ears     Patient Stated Goals Getting back to walking independently and improve balance.  Going back to the gym and use the treadmill.    Currently in Pain? No/denies                             [facility] Adult PT Treatment/Exercise - 09/16/19 1151      Therapeutic Activites    Therapeutic Activities ADL's    ADL's Reviewed how pt has been getting out of the bathtub and how she did previously.  Pt has been able to turn around on her knees and come to stand by grabbing shower chair; pt does the same thing to lower self into tub; mother things this is how the patient hit her head because she was leaning so far forwards and slipped.  From long sitting attempted to push through arms and lift hips enough to walk feet but pt demonstrated weakness in shoulder and elbow extensors.  She also demonstrated hip extension and unable to bring feet under COG.  Returned to sitting on mat through tall and half kneeling.      Exercises   Exercises Shoulder      Shoulder Exercises: Seated   Extension Strengthening;Both;Limitations;Other (comment)    Extension Limitations 2 sets x  3 reps with 5 second hold.  Closed chain, attempted in long sitting on floor first with push up blocks but unable to initiate without scooting hips forwards.  Then performed closed chain lifts sitting on mat with push up blocks, cues to not use LE.      Shoulder Exercises: Standing   Extension Strengthening;Right;Left;10 reps    Theraband Level (Shoulder Extension) Level 2 (Red)    Extension Limitations 2 sets x 10 reps shoulder and elbow extension                   PT Education - 09/16/19 1427    Education Details Added shoulder and elbow extension strengthening to HEP; pt took a video of therapist performing tricep push ups on edge of mat to help with recall at home.    Person(s) Educated Patient;Parent(s)    Methods Explanation;Demonstration;Handout;Other (comment)   video on patient's phone   Comprehension  Verbalized understanding;Returned demonstration          Gaze Stabilization: Sitting    Keeping eyes on target on wall 3 feet away, and move head side to side slowly for 8-10 repetitions. Do __2-3__ sessions per day.   Feet Together, Head Motion - Eyes Open    Chair behind you and chair or counter in front for support/safety.  With eyes open, feet together, move head slowly: up and down 5 repetitions, Perform head turns to the left and right 5 repetitions.. Repeat _2_ times per session.    Bending / Picking Up Objects    Sitting, place your water bottle in front of your feet.  Look down and keep your eyes focused on the top of your water bottle, slowly bend head down and pick up object on the floor. Return to upright position keeping your eyes on the water bottle.  Hold position until symptoms subside. Repeat __5__ times per session. Do __2__ sessions per day.   Push-Up (Sitting)    With a __4__ inch book under each hand, press down while lifting body - keep shoulders down. Use foot to help with balance but don't push up with legs. Hold __3-4__ seconds. Repeat ___5_ times. Do __2__ sessions per day.   EXTENSION: Standing - Resistance Band: Stable (Active)    Stand, right arm at side. Against red resistance band, draw arm backward, as far as possible, keeping elbow straight. Complete _2__ sets of __10_ repetitions.       PT Short Term Goals - 09/13/19 1040      PT SHORT TERM GOAL #1   Title = LTG             PT Long Term Goals - 09/16/19 1433      PT LONG TERM GOAL #1   Title Patient will demonstrate ability to perform final HEP independently, walk on treadmill at home with supervision and return to the gym for ongoing wellness    Baseline HEP with supervision, not using treadmill, not going to gym    Status Revised    Target Date 09/26/19      PT LONG TERM GOAL #2   Title Pt will improve DGI without AD to >/= 21/24 points to indicate decreased falls  risk    Baseline 17/24 supervision without AD    Status Revised      PT LONG TERM GOAL #3   Title Pt will demonstrate ability to perform conditions 1/2 on MCTSIB for 30 seconds and perform condition 3/4 for up to 10 seconds to  indicate improved sensory integration    Baseline 30, 28, not able to perform 3 and 4    Status Revised      PT LONG TERM GOAL #4   Title Pt will report no dizziness or headaches with bed mobility, transfers, head/body turns, bending down to the floor or looking up at the ceiling to decrease falls risk with ADL and mobility    Baseline mild dizziness with bed mobility, transfers and head/body turns during gait    Status On-going      PT LONG TERM GOAL #5   Title Pt will be able to sit down in tub and stand back up with supervision; pt will be able to perform shower in stand up shower with supervision only without LOB to increase independence with ADL    Baseline walking without RW but still needs assistance to get down into and out of the tub (all the way down)    Period Weeks    Status Revised                 Plan - 09/16/19 1430    Clinical Impression Statement Continued to address patient's goal of being able to stand up out of a low bathtub; pt did try standing through tall and half kneeling at home and was able to get up but it still felt like a lot of work.  Focused today on UE strengthening, especially shoulder and elbow extensors to assist with transfer up from low surface.  Will continue to address and progress towards LTG.    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, childhood, nystagmus, visual field defect, sensorineural hearing loss of bilat ears    Rehab Potential Good    PT Frequency 2x / week    PT Duration 6 weeks     PT Treatment/Interventions ADLs/Self Care Home Management;Aquatic Therapy;Canalith Repostioning;DME Instruction;Gait training;Stair training;Functional mobility training;Patient/family education;Therapeutic activities;Orthotic Fit/Training;Therapeutic exercise;Balance training;Neuromuscular re-education;Vestibular    PT Next Visit Plan Pt and mother need to schedule more appointments past 9/9, will need to schedule 12 more, 2x/week x 6 weeks.  Goals and recert due 9/9 - submit to [facility] for 12 more visits.         How are shoulder exercises?  Continue to work on tricep strength with dips.  Continue balance and habituation exercises - progress x1 viewing to standing for HEP? Cont with LE strengthening - leg press, tall kneeling for core stabilization - Getting down into and out of bathtub at home.  Being able to stand safely in the shower.    Consulted and Agree with Plan of Care Patient;Family member/caregiver           Patient will benefit from skilled therapeutic intervention in order to improve the following deficits and impairments:  Abnormal gait, Decreased coordination, Difficulty walking, Dizziness, Decreased activity tolerance, Decreased balance, Decreased strength, Impaired sensation, Pain  Visit Diagnosis: Other abnormalities of gait and mobility  Dizziness and giddiness  Unsteadiness on feet  Muscle weakness (generalized)  Repeated falls     Problem List Patient Active Problem List   Diagnosis Date Noted  . Mild episode of recurrent major depressive disorder ([place]) 09/04/2019  . Anxiety 08/16/2019  . 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  . 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 Status post radiation therapy 11/22/2018  . Seasonal and perennial allergic  rhinitis 11/22/2018  . Seasonal allergic conjunctivitis 11/22/2018  . Acute sinusitis 11/22/2018  . Keratosis pilaris 10/05/2017  . Melanocytic nevi of trunk 10/05/2017  . Adverse food reaction 02/09/2017  .  Gastroesophageal reflux disease 02/09/2017  . Mild intermittent asthma with acute exacerbation 11/19/2015  . Mild persistent asthma without complication 42/76/7011  . Acute seasonal allergic rhinitis due to pollen 11/16/2015  . Anaphylactic shock due to adverse food reaction 11/16/2015  . 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 Type 1 diabetes mellitus without complication ([place]) 00/34/9611  . Dry mouth 11/16/2015  . Xerosis cutis 11/16/2015  . Nystagmus 08/15/2014  . Visual field defect 08/15/2014  . Disequilibrium 07/25/2014  . 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    Rico Junker, PT, DPT 09/16/19    2:38 PM    [facility] [address] [place], [name], [address] Phone: [phone]   Fax:  [phone]  Name: Tracey Perkins MRN: [id number] Date of Birth: 1985/11/23

## 2019-09-17 ENCOUNTER — Ambulatory Visit (INDEPENDENT_AMBULATORY_CARE_PROVIDER_SITE_OTHER): Payer: Medicaid Other | Admitting: Clinical

## 2019-09-17 DIAGNOSIS — F330 Major depressive disorder, recurrent, mild: Secondary | ICD-10-CM | POA: Diagnosis not present

## 2019-09-18 NOTE — Progress Notes (Signed)
   THERAPIST PROGRESS NOTE  Session Time: 37 minutes  Participation Level: Active  Behavioral Response: CasualAlertEuthymic  Type of Therapy: Individual Therapy  Treatment Goals addressed: Coping and Diagnosis: Depression  Interventions: CBT and Supportive  Summary:  Tracey Perkins is a 34 y.o. female who presents for the scheduled session oriented times five, appropriately dressed, and friendly. Client denied hallucinations and delusions. Client presents for a follow up outpatient therapy visit following discharge from [facility] on 7/29 and on 08/20/2019. Client reported she was voluntarily admitted by assistance of her family due to a depressive episode. Client reported the precipitant to her treatment was the recent breakup of her four year long term relationship. Client reported when she got out of the hospital for the treatment of a concussion her boyfriend broke up with her and put a restraining order against her. Client reported she told her mother she was having "fleeting" suicidal ideations but, did not have plan or intent to act on those thoughts. Client reported this is her first hospitalization for mental health related symptoms. Client reported she was diagnosed with a brain tumor as a child and has had surgery to remove it.  Client reported she is currently living with her parents and two younger sisters and they get along very well. 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 Client reported she is taking the semester off to focus on her mental health but will start back next semester.  Client stated, "I'm doing better but the problem is the processing because of my concussion". Client reported her parents have had concern about her quickness to react when she is upset. Client reported her goals for therapy are, Client stated, "not being so negative and/ or hard on myself". Client reported I want to positive in reminding myself I'm an independent  person.      Counselor from 09/17/2019 in [facility]  PHQ-9 Total Score 7         Suicidal/Homicidal: Nowithout intent/plan  Therapist Response:  Therapist conducted the session as a follow up appointment for outpatient therapy services following discharge from [facility]. Therapist made introductions and discussed the confidentiality clause. Therapist collaborated with the client to discuss the assessment gathered and precipitating factors that led to her treatment. Therapist engaged the client in discussion about her previous ability to cope with depression compared to presently.  Therapist asked the client open ended questions about her treatment goals and collaborated to complete the depression treatment plan. Therapist addressed questions and concerns. Therapist assisted with scheduling next appointments.      Plan: Return again in 4 weeks for individual therapy.  Diagnosis: Mild episode of recurrent major depressive disorder    Birdena Jubilee Akil Hoos, LCSW 09/18/2019

## 2019-09-19 ENCOUNTER — Other Ambulatory Visit: Payer: Self-pay

## 2019-09-19 ENCOUNTER — Ambulatory Visit: Payer: Medicaid Other | Attending: Neurology | Admitting: Physical Therapy

## 2019-09-19 DIAGNOSIS — R2689 Other abnormalities of gait and mobility: Secondary | ICD-10-CM | POA: Diagnosis present

## 2019-09-19 DIAGNOSIS — R2681 Unsteadiness on feet: Secondary | ICD-10-CM | POA: Insufficient documentation

## 2019-09-19 DIAGNOSIS — M6281 Muscle weakness (generalized): Secondary | ICD-10-CM | POA: Diagnosis present

## 2019-09-20 ENCOUNTER — Encounter: Payer: Self-pay | Admitting: Physical Therapy

## 2019-09-20 NOTE — Therapy (Addendum)
[facility] [address] [place], [name], [address] Phone: [phone]   Fax:  [phone]  Physical Therapy Treatment  Patient Details  Name: Tracey Perkins MRN: [id number] Date of Birth: Aug 25, 1985 Referring Provider (PT): Erin Sons, MD   Encounter Date: 09/19/2019    Past Medical History:  Diagnosis Date  . Asthma   . Brain tumor ([place])   . Diabetes mellitus without complication ([place])   . Eczema   . Food allergy   . Gastroparesis   . Hypokalemia   . Hypomagnesemia   . Hypothyroid   . Neuropathy   . Thyroid disease   . Vision changes     Past Surgical History:  Procedure Laterality Date  . BRAIN SURGERY    . Portacath placement and removed    . WISDOM TOOTH EXTRACTION      There were no vitals filed for this visit.    TherEx:  Tall kneeling on mat on floor - squats back onto heels 10 reps for hip extensor strengthening Pt performed tricep strengthening with green theraband in tall kneeling for fasciitation of core stabilization  1/2 kneeling on RLE and then on LLE - head turns horizontal and vertical 5 reps each  Pt performed tricep strengthening with 3# dumb bell in standing - UE flexed for anti- gravity position for tricep strengthening  NeuroRe-ed:  Tall kneeling - EC - head turns side to side and up/down 5 reps each direction  Tap ups to 1st step 5 reps alternating; to 2nd step 10 reps each foot alternating with UE support on rail prn                    alternating forward and side kicks for balance and hip strengthening 10 reps each leg each direction with CGA     PT Short Term Goals - 09/13/19 1040      PT SHORT TERM GOAL #1   Title = LTG             PT Long Term Goals - 09/20/19 1107      PT LONG TERM GOAL #1   Title Patient will demonstrate ability to perform final HEP independently, walk on treadmill at home with supervision and return to the  gym for ongoing wellness    Baseline HEP with supervision, not using treadmill, not going to gym    Time 4    Period Weeks    Status Revised    Target Date 11/15/19      PT LONG TERM GOAL #2   Title Pt will improve DGI without AD to >/= 21/24 points to indicate decreased falls risk    Baseline 17/24 supervision without AD    Time 4    Period Weeks    Status Revised    Target Date 11/15/19      PT LONG TERM GOAL #3   Title Pt will demonstrate ability to perform conditions 1/2 on MCTSIB for 30 seconds and perform condition 3/4 for up to 10 seconds to indicate improved sensory integration    Baseline 30, 28, not able to perform 3 and 4    Time 4    Period Weeks    Status Revised    Target Date 11/15/19      PT LONG TERM GOAL #4   Title Pt will report no dizziness or headaches with bed mobility, transfers, head/body turns, bending down to the floor or looking up at the ceiling to  decrease falls risk with ADL and mobility    Baseline mild dizziness with bed mobility, transfers and head/body turns during gait    Time 4    Period Weeks    Status On-going    Target Date 11/15/19      PT LONG TERM GOAL #5   Title Pt will be able to sit down in tub and stand back up with supervision; pt will be able to perform shower in stand up shower with supervision only without LOB to increase independence with ADL    Baseline walking without RW but still needs assistance to get down into and out of the tub (all the way down)    Time 4    Period Weeks    Status Revised    Target Date 11/15/19             09/20/19 1104  Plan  Clinical Impression Statement Pt demonstrates decreased high level balance skills, especially decreased SLS on LLE.  Pt also has weakness in bil. hip extensors, impacting ability to perform sit to stand without difficulty, contributing to difficulty getting out of bathtub independently.  Comorbidities asthma, eczema, food allergy, polyneuropathy, uncontrolled type 1  diabetes, gastroparesis, h/o chronic L thalamic infarct, new R thalamic infarct since 2016, lymphocytic thyroiditis, s/p radiation therapy, chemo and 2 surgical resection for medulloblastoma, childhood, nystagmus, visual field defect, sensorineural hearing loss of bilat ears  Pt will benefit from skilled therapeutic intervention in order to improve on the following deficits Abnormal gait;Decreased coordination;Difficulty walking;Dizziness;Decreased activity tolerance;Decreased balance;Decreased strength;Impaired sensation;Pain  Rehab Potential Good  PT Frequency 2x / week  PT Duration 4 weeks  PT Treatment/Interventions ADLs/Self Care Home Management;Aquatic Therapy;Canalith Repostioning;DME Instruction;Gait training;Stair training;Functional mobility training;Patient/family education;Therapeutic activities;Orthotic Fit/Training;Therapeutic exercise;Balance training;Neuromuscular re-education;Vestibular  PT Next Visit Plan Pt has been seen for 5 of the 24 authorized visits in authorized time period of 08-15-19 - 09-26-19:  No LTG's were assessed/met due to pt cancelling both appts week of Sept. 6;  Pt's attendance has been inconsistent due to multiple hospital admissions due to mental health crisis which appears to have stabilized at this time.  LTG's to be continued due to not achieved.  Goals to be continued - requesting 8 additonal visits (2x/week x 4 weeks) to address balance and gait deficits and strength deficits.  Consulted and Agree with Plan of Care Patient;Family member/caregiver              Patient will benefit from skilled therapeutic intervention in order to improve the following deficits and impairments:  Abnormal gait, Decreased coordination, Difficulty walking, Dizziness, Decreased activity tolerance, Decreased balance, Decreased strength, Impaired sensation, Pain  Visit Diagnosis: Other abnormalities of gait and mobility  Muscle weakness (generalized)  Unsteadiness on  feet     Problem List Patient Active Problem List   Diagnosis Date Noted  . Mild episode of recurrent major depressive disorder ([place]) 09/04/2019  . Anxiety 08/16/2019  . 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  . 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 Status post radiation therapy 11/22/2018  . Seasonal and perennial allergic rhinitis 11/22/2018  . Seasonal allergic conjunctivitis 11/22/2018  . Acute sinusitis 11/22/2018  . Keratosis pilaris 10/05/2017  . Melanocytic nevi of trunk 10/05/2017  . Adverse food reaction 02/09/2017  . Gastroesophageal reflux disease 02/09/2017  . Mild intermittent asthma with acute exacerbation 11/19/2015  . Mild persistent asthma without complication 89/21/1941  . Acute seasonal allergic rhinitis due to pollen 11/16/2015  . Anaphylactic shock due to adverse food reaction  11/16/2015  . 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 Type 1 diabetes mellitus without complication ([place]) 03/18/4994  . Dry mouth 11/16/2015  . Xerosis cutis 11/16/2015  . Nystagmus 08/15/2014  . Visual field defect 08/15/2014  . Disequilibrium 07/25/2014  . 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    Fritzi Scripter, [place], PT 10/10/2019, 2:08 PM  [facility] [address] [place], [name], [address] Phone: [phone]   Fax:  [phone]  Name: Tracey Perkins MRN: [id number] Date of Birth: March 31, 1985

## 2019-09-24 ENCOUNTER — Ambulatory Visit: Payer: Medicaid Other | Admitting: Physical Therapy

## 2019-09-26 ENCOUNTER — Ambulatory Visit: Payer: Medicaid Other | Admitting: Physical Therapy

## 2019-10-07 ENCOUNTER — Ambulatory Visit: Payer: Medicaid Other | Admitting: Physical Therapy

## 2019-10-10 NOTE — Progress Notes (Signed)
   09/20/19 1104  Plan  Clinical Impression Statement Pt demonstrates decreased high level balance skills, especially decreased SLS on LLE.  Pt also has weakness in bil. hip extensors, impacting ability to perform sit to stand without difficulty, contributing to difficulty getting out of bathtub independently.  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, childhood, nystagmus, visual field defect, sensorineural hearing loss of bilat ears  Pt will benefit from skilled therapeutic intervention in order to improve on the following deficits Abnormal gait;Decreased coordination;Difficulty walking;Dizziness;Decreased activity tolerance;Decreased balance;Decreased strength;Impaired sensation;Pain  Rehab Potential Good  PT Frequency 2x / week  PT Duration 6 weeks  PT Treatment/Interventions ADLs/Self Care Home Management;Aquatic Therapy;Canalith Repostioning;DME Instruction;Gait training;Stair training;Functional mobility training;Patient/family education;Therapeutic activities;Orthotic Fit/Training;Therapeutic exercise;Balance training;Neuromuscular re-education;Vestibular  PT Next Visit Plan Pt has been seen for 5 of the 24 authorized visits in authorized time period of 08-15-19 - 09-26-19:  No LTG's were assessed/met due to pt cancelling both appts week of Sept. 6;  Pt's attendance has been inconsistent due to multiple hospital admissions due to mental health crisis which appears to have stabilized at this time.  LTG's to be continued due to not achieved.  Goals to be continued - requesting 8 additonal visits (2x/week x 4 weeks) to address balance and gait deficits and strength deficits.  Consulted and Agree with Plan of Care Patient;Family member/caregiver

## 2019-10-10 NOTE — Progress Notes (Incomplete)
   09/20/19 1104  Plan  Clinical Impression Statement Pt demonstrates decreased high level balance skills, especially decreased SLS on LLE.  Pt also has weakness in bil. hip extensors, impacting ability to perform sit to stand without difficulty, contributing to difficulty getting out of bathtub independently.  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, childhood, nystagmus, visual field defect, sensorineural hearing loss of bilat ears  Pt will benefit from skilled therapeutic intervention in order to improve on the following deficits Abnormal gait;Decreased coordination;Difficulty walking;Dizziness;Decreased activity tolerance;Decreased balance;Decreased strength;Impaired sensation;Pain  Rehab Potential Good  PT Frequency 2x / week  PT Duration 6 weeks  PT Treatment/Interventions ADLs/Self Care Home Management;Aquatic Therapy;Canalith Repostioning;DME Instruction;Gait training;Stair training;Functional mobility training;Patient/family education;Therapeutic activities;Orthotic Fit/Training;Therapeutic exercise;Balance training;Neuromuscular re-education;Vestibular  PT Next Visit Plan Pt has been seen for 5 of the 24 authorized visits in authorized time period of 08-15-19 - 09-26-19:  No LTG's were assessed/met due to pt cancelling both appts week of Sept. 6;  Pt's attendance has been inconsistent due to multiple hospital admissions due to mental health crisis which appears to have stabilized at this time.  LTG's to be continued due to not achieved.  Goals to be continued - requesting 8 additonal visits (2x/week x 4 weeks) to address balance and gait deficits and strength deficits.  Consulted and Agree with Plan of Care Patient;Family member/caregiver

## 2019-10-14 ENCOUNTER — Encounter (HOSPITAL_COMMUNITY): Payer: Self-pay | Admitting: Psychiatry

## 2019-10-14 ENCOUNTER — Telehealth (INDEPENDENT_AMBULATORY_CARE_PROVIDER_SITE_OTHER): Payer: Medicaid Other | Admitting: Psychiatry

## 2019-10-14 ENCOUNTER — Other Ambulatory Visit: Payer: Self-pay

## 2019-10-14 DIAGNOSIS — R4587 Impulsiveness: Secondary | ICD-10-CM | POA: Diagnosis not present

## 2019-10-14 DIAGNOSIS — F330 Major depressive disorder, recurrent, mild: Secondary | ICD-10-CM | POA: Diagnosis not present

## 2019-10-14 DIAGNOSIS — F419 Anxiety disorder, unspecified: Secondary | ICD-10-CM

## 2019-10-14 MED ORDER — CITALOPRAM HYDROBROMIDE 10 MG PO TABS: 5.0000 mg | ORAL_TABLET | Freq: Every day | ORAL | 2 refills | Status: DC

## 2019-10-14 MED ORDER — ARIPIPRAZOLE 2 MG PO TABS: 2.0000 mg | ORAL_TABLET | Freq: Every day | ORAL | 2 refills | Status: DC

## 2019-10-14 NOTE — Progress Notes (Signed)
[name] MD/PA/NP OP Progress Note Virtual Visit via Video Note  I connected with [name] on 10/14/19 at 11:00 AM EDT by a video enabled telemedicine application and verified that I am speaking with the correct person using two identifiers.  Location: Patient: Home Provider: Clinic   I discussed the limitations of evaluation and management by telemedicine and the availability of in person appointments. The patient expressed understanding and agreed to proceed.  I provided 30 minutes of non-face-to-face time during this encounter.    10/14/2019 12:02 PM Tracey Perkins  MRN:  [id number]  Chief Complaint:  "My parents are concerned about my judgment"           Per mother "She is having difficulty with impulse control"  HPI: 34 year old female seen today for follow up psychiatric evaluation.   She has a psychiatric history of depression and anxiey.  She is currently being managed on Celexa 5 mg daily. She notes that he medication is effective in controlling her anxiety and depression. She however notes that she is having problems controlling her impulsiveness.   Today she is well groomed, pleasant, engaged in conversation, and maintained  eye contact.  Patient speech was slowed and the volume was decreased.  Patient recently she experienced a concussion and notes that her neurologist told her she may have frontal lobe damage. She will follow up with her neurologist on 09/18/2019 for follow up evaluation. She informed Probation officer that she is occasionally depressed and anxious surrounding the resent break up with her fiance and a pending restraining order however notes that her anxiety and depression is under control. She denies SI/HI/VAH or paranoia.   Patient informed provider that her parent are concerned about her judgement and impulsive behaviors. Patient was seen with her mother who notes that her daughter recently lied to her father about meeting up with her friends. She notes that her father  dropped her off at a store to meet friends and the patient called a cab to take her to her ex-fiances house. Patient notes that she was aware that he had a restraining order against her however reports she wanted to drop off some of his things. She has also texted him and contacted his mother to check on him after being asked not to. Patient mother notes that Shalen's ex was going to drop the restraining order if the patient did not contact him however now that she violated it he is going to file it. The patient and her parents are now concerned that she will go to jail or be hospitalized due to her impulsive acts. Patient mother also informed Probation officer that the patient is also out of money due to over spending.      She is agreeable to start Abilify 2 mg to help manage impulsive behaviors. Potential side effects of medication and risks vs benefits of treatment vs non-treatment were explained and discussed. All questions were answered. She will continue all other medications as prescribed and follow up with outpatient therapy for counseling. No other concerns noted at this time.  Visit Diagnosis:    ICD-10-CM   1. Anxiety  F41.9   2. Mild episode of recurrent major depressive disorder (HCC)  F33.0     Past Psychiatric History: Anxiety and depression   Past Medical History:  Past Medical History:  Diagnosis Date  . Asthma   . Brain tumor ([place])   . Diabetes mellitus without complication ([place])   . Eczema   . Food allergy   .  Gastroparesis   . Hypokalemia   . Hypomagnesemia   . Hypothyroid   . Neuropathy   . Thyroid disease   . Vision changes     Past Surgical History:  Procedure Laterality Date  . BRAIN SURGERY    . Portacath placement and removed    . WISDOM TOOTH EXTRACTION      Family Psychiatric History: Maternal uncle schizophrenia and overdosed on pain pills. Paternal cousin and aunt bipolar  Family History:  Family History  Problem Relation Age of Onset  . Thyroid disease  Mother   . Eczema Sister   . Thyroid disease Sister   . Thyroid disease Sister     Social History:  Social History   Socioeconomic History  . Marital status: Single    Spouse name: Not on file  . Number of children: Not on file  . Years of education: Not on file  . Highest education level: Not on file  Occupational History  . Not on file  Tobacco Use  . Smoking status: Never Smoker  . Smokeless tobacco: Never Used  Vaping Use  . Vaping Use: Never used  Substance and Sexual Activity  . Alcohol use: No  . Drug use: No  . Sexual activity: Yes    Birth control/protection: Pill  Other Topics Concern  . Not on file  Social History Narrative  . Not on file   Social Determinants of Health   Financial Resource Strain:   . Difficulty of Paying Living Expenses: Not on file  Food Insecurity:   . Worried About Charity fundraiser in the Last Year: Not on file  . Ran Out of Food in the Last Year: Not on file  Transportation Needs:   . Lack of Transportation (Medical): Not on file  . Lack of Transportation (Non-Medical): Not on file  Physical Activity:   . Days of Exercise per Week: Not on file  . Minutes of Exercise per Session: Not on file  Stress:   . Feeling of Stress : Not on file  Social Connections:   . Frequency of Communication with Friends and Family: Not on file  . Frequency of Social Gatherings with Friends and Family: Not on file  . Attends Religious Services: Not on file  . Active Member of Clubs or Organizations: Not on file  . Attends Archivist Meetings: Not on file  . Marital Status: Not on file    Allergies:  Allergies  Allergen Reactions  . Banana Other (See Comments)    Mouth burning and swollen  . Bactrim [Sulfamethoxazole-Trimethoprim] Hives  . Doxycycline Hives  . Gentian Violet Other (See Comments)    "It burns."  Per mother  . Gentian Violet-Proflavine Sulfate [Triple Dye] Other (See Comments)    Mouth burn  . Mold Extract  [Trichophyton] Other (See Comments)    Hay Fever  . Morphine And Related Hives  . Other Other (See Comments) and Hives    "Hay fever, dust and pollen."  Per patient.  . Vancomycin Other (See Comments)    Red man syndrome     Metabolic Disorder Labs: No results found for: HGBA1C, MPG No results found for: PROLACTIN No results found for: CHOL, TRIG, HDL, CHOLHDL, VLDL, LDLCALC No results found for: TSH  Therapeutic Level Labs: No results found for: LITHIUM No results found for: VALPROATE No components found for:  CBMZ  Current Medications: Current Outpatient Medications  Medication Sig Dispense Refill  . acetaminophen (TYLENOL) 500 MG tablet Take  500 mg by mouth every 6 (six) hours as needed for moderate pain.     Marland Kitchen aspirin EC 81 MG tablet Take 81 mg by mouth daily. Swallow whole.    Marland Kitchen azelastine (ASTELIN) 0.1 % nasal spray Place 2 sprays into both nostrils 2 (two) times daily. 30 mL 5  . cetirizine (ZYRTEC) 10 MG tablet Take 1 tablet (10 mg total) by mouth daily. 34 tablet 5  . citalopram (CELEXA) 10 MG tablet Take 1 tablet (10 mg total) by mouth daily. Patient will cut pill in half. She has a traumatic brain injury and will take half the dose. 30 tablet 2  . Continuous Blood Gluc Sensor (FREESTYLE LIBRE SENSOR SYSTEM) MISC USE 1 DEVICE EVERY 10 (TEN) DAYS.  11  . Continuous Blood Gluc Transmit (DEXCOM G6 TRANSMITTER) MISC USE 1 EACH EVERY 3 (THREE) MONTHS    . EPINEPHrine 0.3 mg/0.3 mL IJ SOAJ injection Use as directed for severe allergic reaction. (Patient taking differently: Inject 0.3 mg into the muscle as needed for anaphylaxis. Use as directed for severe allergic reaction.) 2 Device 1  . fluticasone (FLONASE) 50 MCG/ACT nasal spray 2 sprays per nostril daily as needed for stuffy nose. (Patient taking differently: Place 2 sprays into both nostrils daily as needed for allergies. ) 16 g 5  . fluticasone (FLOVENT HFA) 110 MCG/ACT inhaler Inhale 2 puffs into the lungs daily for 1  dose. 1 each 0  . gabapentin (NEURONTIN) 300 MG capsule Take 300 mg by mouth at bedtime.   3  . glucagon (GLUCAGON EMERGENCY) 1 MG injection Inject 1 mg into the skin once as needed (for diabeties).     Marland Kitchen glucose blood test strip Use 3 (three) times daily Use as instructed.    Marland Kitchen ibuprofen (ADVIL) 200 MG tablet Take 200 mg by mouth every 6 (six) hours as needed for moderate pain.    Marland Kitchen insulin glargine (LANTUS) 100 UNIT/ML injection Inject 16 Units into the skin daily.     . Insulin Human (INSULIN PUMP) SOLN Inject 80 each into the skin daily. Novolog Insulin Pump    . KLOR-CON M10 10 MEQ tablet Take 10 mEq by mouth daily.    Marland Kitchen levothyroxine (SYNTHROID, LEVOTHROID) 88 MCG tablet Take 88 mcg by mouth daily before breakfast.    . lubiprostone (AMITIZA) 8 MCG capsule Take 16 mcg by mouth daily with breakfast.     . magnesium oxide (MAG-OX) 400 MG tablet Take 800 mg by mouth at bedtime.    . meclizine (ANTIVERT) 25 MG tablet Take 25 mg by mouth 2 (two) times daily as needed for dizziness.     . montelukast (SINGULAIR) 10 MG tablet Take 1 tablet (10 mg total) by mouth at bedtime. To prevent coughing or wheezing 34 tablet 5  . norgestimate-ethinyl estradiol (ORTHO-CYCLEN) 0.25-35 MG-MCG tablet Take 1 tablet by mouth daily.     . pantoprazole (PROTONIX) 40 MG tablet Take 40 mg by mouth daily as needed (acid reflux/heartburn).     Marland Kitchen PROAIR HFA 108 (90 Base) MCG/ACT inhaler TAKE 2 PUFFS BY MOUTH EVERY 4 HOURS AS NEEDED (Patient taking differently: Inhale 2 puffs into the lungs every 6 (six) hours as needed for wheezing or shortness of breath. ) 6.7 g 0   No current facility-administered medications for this visit.     Musculoskeletal: Strength & Muscle Tone: decreased Gait & Station: unsteady Patient leans: N/A  Psychiatric Specialty Exam: Review of Systems  There were no vitals taken for this visit.There  is no height or weight on file to calculate BMI.  General Appearance: Well Groomed  Eye  Contact:  Good  Speech:  Clear and Coherent and Normal Rate  Volume:  Normal  Mood:  Euthymic  Affect:  Appropriate and Congruent  Thought Process:  Coherent, Goal Directed and Linear  Orientation:  Full (Time, Place, and Person)  Thought Content: WDL and Logical   Suicidal Thoughts:  No  Homicidal Thoughts:  No  Memory:  Immediate;   Fair Recent;   Fair Remote;   Fair  Judgement:  Fair  Insight:  Fair  Psychomotor Activity:  Normal  Concentration:  Concentration: Good and Attention Span: Good  Recall:  [place] of Knowledge: Good  Language: Good  Akathisia:  No  Handed:  Right  AIMS (if indicated):Not done  Assets:  Communication Skills Desire for Improvement Financial Resources/Insurance Housing Social Support  ADL's:  Intact  Cognition: WNL  Sleep:  Good   Screenings: PHQ2-9     Counselor from 09/17/2019 in [facility] Nutrition from 08/20/2014 in [facility]  PHQ-2 Total Score 2 0  PHQ-9 Total Score 7 --       Assessment and Plan: Patient notes that she is occassionally depressed and anxious due to life stressors. She notes that her impulsive behaviors are interfering with her life and she notes that she fears she may go to jail. She is agreeable to start Abilify 2 mg to help manage impulsiveness. She will continue all other medications as prescribed.       Salley Slaughter, NP 10/14/2019, 12:02 PM

## 2019-10-17 ENCOUNTER — Encounter (HOSPITAL_COMMUNITY): Payer: Medicaid Other | Admitting: Psychiatry

## 2019-10-22 ENCOUNTER — Ambulatory Visit: Payer: Medicaid Other | Admitting: Physical Therapy

## 2019-10-24 ENCOUNTER — Telehealth (HOSPITAL_COMMUNITY): Payer: Self-pay | Admitting: *Deleted

## 2019-10-24 ENCOUNTER — Ambulatory Visit: Payer: Medicaid Other | Admitting: Physical Therapy

## 2019-10-24 NOTE — Telephone Encounter (Signed)
Mom called to report that her daughter, the patient recently started on Abilify and she is a type 1 diabetic. Last night they were up all night because her sugars were elevated over 400 and the things they have always done in the past to lower them werent working. She can not think of anything that could account for her elevated CBG but the Abilify. She would like to talk to Ms Ronne Binning NP re this concern. Will let Ms Ronne Binning know of the concern.

## 2019-10-24 NOTE — Telephone Encounter (Signed)
Writer spoke to patient and her mother reagrding the above message. They reported that last night her blood sugar was elevated above 400.  She notes that they attempted to flush her insulin pump and drink lots of fluid however notes that her glucose level did not reduce until this morning.  She notes now her blood sugar is 218 which is more in her normal range.  Patient's mother informed Probation officer that she started Abilify on 10/21/2019. Provider informed patient and her mother currently an increase in her daily glucose level is uncommon after being on Abilify for 2 days.  Provider however instructed patient and her mother to discontinue Abilify at this time and follow-up with her endocrinologist for further assessment.  After patient is assessed by endocrinologist provider instructed patient to follow-up with psychiatry for further evaluation.

## 2019-10-29 ENCOUNTER — Other Ambulatory Visit: Payer: Self-pay

## 2019-10-29 ENCOUNTER — Ambulatory Visit: Payer: Medicaid Other | Attending: Neurology | Admitting: Physical Therapy

## 2019-10-29 ENCOUNTER — Encounter: Payer: Self-pay | Admitting: Physical Therapy

## 2019-10-29 DIAGNOSIS — R296 Repeated falls: Secondary | ICD-10-CM | POA: Diagnosis present

## 2019-10-29 DIAGNOSIS — M6281 Muscle weakness (generalized): Secondary | ICD-10-CM | POA: Diagnosis present

## 2019-10-29 DIAGNOSIS — R2681 Unsteadiness on feet: Secondary | ICD-10-CM | POA: Insufficient documentation

## 2019-10-29 DIAGNOSIS — R2689 Other abnormalities of gait and mobility: Secondary | ICD-10-CM | POA: Diagnosis not present

## 2019-10-29 DIAGNOSIS — R209 Unspecified disturbances of skin sensation: Secondary | ICD-10-CM | POA: Diagnosis present

## 2019-10-29 DIAGNOSIS — R42 Dizziness and giddiness: Secondary | ICD-10-CM | POA: Diagnosis present

## 2019-10-29 NOTE — Therapy (Signed)
[facility] [address] [place], [name], [address] Phone: [phone]   Fax:  [phone]  Physical Therapy Treatment  Patient Details  Name: Tracey Perkins MRN: [id number] Date of Birth: 1985/04/23 Referring Provider (PT): Erin Sons, MD   Encounter Date: 10/29/2019   PT End of Session - 10/29/19 1443    Visit Number 10    Number of Visits 29    Date for PT Re-Evaluation 11/17/19    Authorization Type Approved for 8 more visits between 10/5 and 10/31    Authorization - Visit Number 1    Authorization - Number of Visits 8    PT Start Time 1232    PT Stop Time 1317    PT Time Calculation (min) 45 min    Activity Tolerance Patient tolerated treatment well    Behavior During Therapy [facility] for tasks assessed/performed           Past Medical History:  Diagnosis Date  . Asthma   . Brain tumor ([place])   . Diabetes mellitus without complication ([place])   . Eczema   . Food allergy   . Gastroparesis   . Hypokalemia   . Hypomagnesemia   . Hypothyroid   . Neuropathy   . Thyroid disease   . Vision changes     Past Surgical History:  Procedure Laterality Date  . BRAIN SURGERY    . Portacath placement and removed    . WISDOM TOOTH EXTRACTION      There were no vitals filed for this visit.   Subjective Assessment - 10/29/19 1235    Subjective Is continuing to work on ways to get out of bathtub.  Can get onto knees and then get out of bathtub.  Dad feels like balance is getting worse but pt feels it is due to sinus issues.  No falls.  Had one LOB where L toes hit patio furniture, toes are tender but did not get it imaged.    Pertinent History asthma, eczema, food allergy, polyneuropathy, uncontrolled type 1 diabetes, gastroparesis, h/o chronic L thalamic infarct, new R thalamic infarct since 2016, lymphocytic thyroiditis, s/p radiation therapy, chemo and 2 surgical resection for medulloblastoma, childhood,  nystagmus, visual field defect, sensorineural hearing loss of bilat ears    Patient Stated Goals Getting back to walking independently and improve balance.  Going back to the gym and use the treadmill.    Currently in Pain? Yes   toes                            [facility] Adult PT Treatment/Exercise - 10/29/19 1257      Therapeutic Activites    Therapeutic Activities ADL's;Other Therapeutic Activities    ADL's Pt is able to pivot from long sitting > tall kneeling > stand with UE support and supervision.  Also trialed getting out of bath tub from long sitting, bumping up on to low stool and then coming to stand x 2 reps.  Required visual demonstration and then verbal and tactile cues to increase forward lean to assist with lifting buttocks onto stool.  Required min-mod A to perform safely.  Will continue to review    Other Therapeutic Activities Pt feels that when she wears her Crocs with a textured insole, she can feel her feet more and her feet "come alive."  Pt asking if she could wear textured insoles with all her shoes.  Discussed risks and benefits  of textured insole - increased proprioception but also risk for pressure injury if too rigid or worn too long and risk for habituation with prolonged wear.  Pt and mother verbalized understanding that if pt were to wear, she should only wear for one hour at a time and then give her feet a break.  Mother also asking about using a treadmill in the house when pt isn't able to walk outside; discussed trial of treadmill in therapy before using at home but may be a good option to use when weather is not suitable for walking outside.      Knee/Hip Exercises: Standing   Step Down Right;Left;2 sets;10 reps;Hand Hold: 1;Step Height: 4"    Step Down Limitations Pt reports LOB when stepping down from curb or step.  Performed alternating step downs on aerobic step with and without UE support focusing on tapping down with heel and then shifting weight  forwards with control instead of leaning forwards with head.  Then performed walking forwards on aerobic step and stepping down safely in one motion with one UE support.                    PT Education - 10/29/19 1443    Education Details see TA; added one more visit within approved dates    Person(s) Educated Patient;Parent(s)    Methods Explanation    Comprehension Verbalized understanding            PT Short Term Goals - 09/13/19 1040      PT SHORT TERM GOAL #1   Title = LTG             PT Long Term Goals - 09/20/19 1107      PT LONG TERM GOAL #1   Title Patient will demonstrate ability to perform final HEP independently, walk on treadmill at home with supervision and return to the gym for ongoing wellness    Baseline HEP with supervision, not using treadmill, not going to gym    Time 4    Period Weeks    Status Revised    Target Date 11/15/19      PT LONG TERM GOAL #2   Title Pt will improve DGI without AD to >/= 21/24 points to indicate decreased falls risk    Baseline 17/24 supervision without AD    Time 4    Period Weeks    Status Revised    Target Date 11/15/19      PT LONG TERM GOAL #3   Title Pt will demonstrate ability to perform conditions 1/2 on MCTSIB for 30 seconds and perform condition 3/4 for up to 10 seconds to indicate improved sensory integration    Baseline 30, 28, not able to perform 3 and 4    Time 4    Period Weeks    Status Revised    Target Date 11/15/19      PT LONG TERM GOAL #4   Title Pt will report no dizziness or headaches with bed mobility, transfers, head/body turns, bending down to the floor or looking up at the ceiling to decrease falls risk with ADL and mobility    Baseline mild dizziness with bed mobility, transfers and head/body turns during gait    Time 4    Period Weeks    Status On-going    Target Date 11/15/19      PT LONG TERM GOAL #5   Title Pt will be able to sit down in tub and  stand back up with  supervision; pt will be able to perform shower in stand up shower with supervision only without LOB to increase independence with ADL    Baseline walking without RW but still needs assistance to get down into and out of the tub (all the way down)    Time 4    Period Weeks    Status Revised    Target Date 11/15/19                 Plan - 10/29/19 1444    Clinical Impression Statement Continued to review other ways to safely exit tub; will continue to trial to determine safest way; also discussed placement of grab bars in tub to assist with safety and balance.  Also continued to address functional gait and balance for community and education for safe HEP and improving proprioception in feet.  Will continue to address in order to progress towards LTG.    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, childhood, nystagmus, visual field defect, sensorineural hearing loss of bilat ears    Rehab Potential Good    PT Frequency 2x / week    PT Duration 6 weeks    PT Treatment/Interventions ADLs/Self Care Home Management;Aquatic Therapy;Canalith Repostioning;DME Instruction;Gait training;Stair training;Functional mobility training;Patient/family education;Therapeutic activities;Orthotic Fit/Training;Therapeutic exercise;Balance training;Neuromuscular re-education;Vestibular    PT Next Visit Plan Trial treadmill with supervision.  curb negotiation/safety.  Getting in and out of bathtub.  Higher level balance    Consulted and Agree with Plan of Care Patient;Family member/caregiver           Patient will benefit from skilled therapeutic intervention in order to improve the following deficits and impairments:  Abnormal gait, Decreased coordination, Difficulty walking, Dizziness,  Decreased activity tolerance, Decreased balance, Decreased strength, Impaired sensation, Pain  Visit Diagnosis: Other abnormalities of gait and mobility  Muscle weakness (generalized)  Unsteadiness on feet  Dizziness and giddiness  Repeated falls  Unspecified disturbances of skin sensation     Problem List Patient Active Problem List   Diagnosis Date Noted  . Impulsiveness 10/14/2019  . Mild episode of recurrent major depressive disorder ([place]) 09/04/2019  . Anxiety 08/16/2019  . 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  . 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 Status post radiation therapy 11/22/2018  . Seasonal and perennial allergic rhinitis 11/22/2018  . Seasonal allergic conjunctivitis 11/22/2018  . Acute sinusitis 11/22/2018  . Keratosis pilaris 10/05/2017  . Melanocytic nevi of trunk 10/05/2017  . Adverse food reaction 02/09/2017  . Gastroesophageal reflux disease 02/09/2017  . Mild intermittent asthma with acute exacerbation 11/19/2015  . Mild persistent asthma without complication 67/34/1937  . Acute seasonal allergic rhinitis due to pollen 11/16/2015  . Anaphylactic shock due to adverse food reaction 11/16/2015  . Medulloblastoma ([place]) 11/16/2015  . Type 1 diabetes mellitus without complication ([place]) 90/24/0973  . Dry mouth 11/16/2015  . Xerosis cutis 11/16/2015  . Nystagmus 08/15/2014  . Visual field defect 08/15/2014  . Disequilibrium 07/25/2014  . 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    Rico Junker, PT, DPT 10/29/19    2:48 PM    [facility] [address] [place], [name], [address] Phone: [phone]   Fax:  [phone]  Name: Tracey Perkins MRN: [id number] Date of Birth: [birth year]

## 2019-10-31 ENCOUNTER — Encounter: Payer: Self-pay | Admitting: Physical Therapy

## 2019-10-31 ENCOUNTER — Telehealth (HOSPITAL_COMMUNITY): Payer: Self-pay | Admitting: *Deleted

## 2019-10-31 ENCOUNTER — Other Ambulatory Visit: Payer: Self-pay

## 2019-10-31 ENCOUNTER — Ambulatory Visit: Payer: Medicaid Other | Admitting: Physical Therapy

## 2019-10-31 DIAGNOSIS — R2689 Other abnormalities of gait and mobility: Secondary | ICD-10-CM

## 2019-10-31 DIAGNOSIS — R296 Repeated falls: Secondary | ICD-10-CM

## 2019-10-31 DIAGNOSIS — M6281 Muscle weakness (generalized): Secondary | ICD-10-CM

## 2019-10-31 DIAGNOSIS — R2681 Unsteadiness on feet: Secondary | ICD-10-CM

## 2019-10-31 NOTE — Patient Instructions (Addendum)
TREADMILL: Is safe to walk on the treadmill with supervision with both hands holding on.  1.2 mph start at 8 minutes.  0 elevation.   Every week increase time by 2 minutes.  Work up to 20-30 minutes and then start to increase speed gradually.    (This is in place of walking outside if weather is bad)    Gaze Stabilization: Sitting    Keeping eyes on target on wall 3 feet away, and move head side to side slowly for 8-10 repetitions. Do __2-3__ sessions per day.   Feet Together, Head Motion - Eyes Open    Chair behind you and chair or counter in front for support/safety.  With eyes open, feet together, move head slowly: up and down 5 repetitions, Perform head turns to the left and right 5 repetitions.. Repeat _2_ times per session.    Bending / Picking Up Objects    Sitting, place your water bottle in front of your feet.  Look down and keep your eyes focused on the top of your water bottle, slowly bend head down and pick up object on the floor. Return to upright position keeping your eyes on the water bottle.  Hold position until symptoms subside. Repeat __5__ times per session. Do __2__ sessions per day.   Push-Up (Sitting)    With a __4__ inch book under each hand, press down while lifting body - keep shoulders down. Use foot to help with balance but don't push up with legs. Hold __3-4__ seconds. Repeat ___5_ times. Do __2__ sessions per day.   EXTENSION: Standing - Resistance Band: Stable (Active)    Stand, right arm at side. Against red resistance band, draw arm backward, as far as possible, keeping elbow straight. Complete _2__ sets of __10_ repetitions.    Step-Down: Forward    Stand on your bottom step, one hand holding the rail.  Have Mom or Dad supervise. Close your eyes and slowly, lower one foot to the floor and then back up to the step.  Switch legs.  Keep alternating stepping down and back up 10 times, with eyes closed and holding the rail.  Go slowly  and control the lowering to the floor.

## 2019-10-31 NOTE — Therapy (Signed)
[facility] [address] [place], [name], [address] Phone: [phone]   Fax:  [phone]  Physical Therapy Treatment  Patient Details  Name: Tracey Perkins MRN: [id number] Date of Birth: 30-Nov-1985 Referring Provider (PT): Erin Sons, MD   Encounter Date: 10/31/2019   PT End of Session - 10/31/19 1537    Visit Number 11    Number of Visits 29    Date for PT Re-Evaluation 11/17/19    Authorization Type Approved for 8 more visits between 10/5 and 10/31    Authorization - Visit Number 2    Authorization - Number of Visits 8    PT Start Time 1219   arrived late   PT Stop Time 1105    PT Time Calculation (min) 36 min    Activity Tolerance Patient tolerated treatment well    Behavior During Therapy [facility] for tasks assessed/performed           Past Medical History:  Diagnosis Date  . Asthma   . Brain tumor ([place])   . Diabetes mellitus without complication ([place])   . Eczema   . Food allergy   . Gastroparesis   . Hypokalemia   . Hypomagnesemia   . Hypothyroid   . Neuropathy   . Thyroid disease   . Vision changes     Past Surgical History:  Procedure Laterality Date  . BRAIN SURGERY    . Portacath placement and removed    . WISDOM TOOTH EXTRACTION      There were no vitals filed for this visit.   Subjective Assessment - 10/31/19 1030    Subjective Bought an exercise physioball, has been working out with the ball.  Is willing to try treadmill today.    Pertinent History asthma, eczema, food allergy, polyneuropathy, uncontrolled type 1 diabetes, gastroparesis, h/o chronic L thalamic infarct, new R thalamic infarct since 2016, lymphocytic thyroiditis, s/p radiation therapy, chemo and 2 surgical resection for medulloblastoma, childhood, nystagmus, visual field defect, sensorineural hearing loss of bilat ears    Patient Stated Goals Getting back to walking independently and improve balance.  Going  back to the gym and use the treadmill.    Currently in Pain? No/denies                 [facility] Adult PT Treatment/Exercise - 10/31/19 1032      Knee/Hip Exercises: Aerobic   Tread Mill To assess safety for home use: performed x 8 minutes at 1.2 mph with bilat UE support.  Pt did not report any dizziness even with looking over at therapist to L (turning head to L).  Pt experienced LOB when removing one hand from support.        Knee/Hip Exercises: Standing   Step Down Right;Left;2 sets;10 reps;Hand Hold: 2;Hand Hold: 1;Step Height: 6"    Step Down Limitations Performed step downs on bottom stair first with EO and then progressing to EC with supervision-min A and verbal cues for controlled tap down to floor to facilitate increased proprioceptive awareness when stepping down off an elevated surface            TREADMILL: Is safe to walk on the treadmill with supervision with both hands holding on.  1.2 mph start at 8 minutes.  0 elevation.   Every week increase time by 2 minutes.  Work up to 20-30 minutes and then start to increase speed gradually.    (This is in place of walking outside if weather is  bad)    Gaze Stabilization: Sitting    Keeping eyes on target on wall 3 feet away, and move head side to side slowly for 8-10 repetitions. Do __2-3__ sessions per day.   Feet Together, Head Motion - Eyes Open    Chair behind you and chair or counter in front for support/safety.  With eyes open, feet together, move head slowly: up and down 5 repetitions, Perform head turns to the left and right 5 repetitions.. Repeat _2_ times per session.    Bending / Picking Up Objects    Sitting, place your water bottle in front of your feet.  Look down and keep your eyes focused on the top of your water bottle, slowly bend head down and pick up object on the floor. Return to upright position keeping your eyes on the water bottle.  Hold position until symptoms subside. Repeat __5__ times  per session. Do __2__ sessions per day.   Push-Up (Sitting)    With a __4__ inch book under each hand, press down while lifting body - keep shoulders down. Use foot to help with balance but don't push up with legs. Hold __3-4__ seconds. Repeat ___5_ times. Do __2__ sessions per day.   EXTENSION: Standing - Resistance Band: Stable (Active)    Stand, right arm at side. Against red resistance band, draw arm backward, as far as possible, keeping elbow straight. Complete _2__ sets of __10_ repetitions.    Step-Down: Forward    Stand on your bottom step, one hand holding the rail.  Have Mom or Dad supervise. Close your eyes and slowly, lower one foot to the floor and then back up to the step.  Switch legs.  Keep alternating stepping down and back up 10 times, with eyes closed and holding the rail.  Go slowly and control the lowering to the floor.            PT Education - 10/31/19 1537    Education Details updated HEP, added treadmill walking at home    Person(s) Educated Patient    Methods Explanation;Demonstration;Handout    Comprehension Verbalized understanding;Returned demonstration            PT Short Term Goals - 09/13/19 1040      PT SHORT TERM GOAL #1   Title = LTG             PT Long Term Goals - 09/20/19 1107      PT LONG TERM GOAL #1   Title Patient will demonstrate ability to perform final HEP independently, walk on treadmill at home with supervision and return to the gym for ongoing wellness    Baseline HEP with supervision, not using treadmill, not going to gym    Time 4    Period Weeks    Status Revised    Target Date 11/15/19      PT LONG TERM GOAL #2   Title Pt will improve DGI without AD to >/= 21/24 points to indicate decreased falls risk    Baseline 17/24 supervision without AD    Time 4    Period Weeks    Status Revised    Target Date 11/15/19      PT LONG TERM GOAL #3   Title Pt will demonstrate ability to perform conditions 1/2  on MCTSIB for 30 seconds and perform condition 3/4 for up to 10 seconds to indicate improved sensory integration    Baseline 30, 28, not able to perform 3 and 4  Time 4    Period Weeks    Status Revised    Target Date 11/15/19      PT LONG TERM GOAL #4   Title Pt will report no dizziness or headaches with bed mobility, transfers, head/body turns, bending down to the floor or looking up at the ceiling to decrease falls risk with ADL and mobility    Baseline mild dizziness with bed mobility, transfers and head/body turns during gait    Time 4    Period Weeks    Status On-going    Target Date 11/15/19      PT LONG TERM GOAL #5   Title Pt will be able to sit down in tub and stand back up with supervision; pt will be able to perform shower in stand up shower with supervision only without LOB to increase independence with ADL    Baseline walking without RW but still needs assistance to get down into and out of the tub (all the way down)    Time 4    Period Weeks    Status Revised    Target Date 11/15/19                 Plan - 10/31/19 1538    Clinical Impression Statement Reviewed safe use of treadmill for home walking program when not able to walk outside with parents.  Pt able to perform with supervision with no LOB and no dizziness at 1.2 mph and bilat UE support.  Reviewed and provided patient with step down exercise for HEP to facilitate increased proprioceptive awareness when stepping off elevated surface when depth perception is altered.  Will continue to address in order to progress towards LTG.    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, childhood, nystagmus, visual field defect, sensorineural hearing loss of bilat ears    Rehab Potential Good    PT  Frequency 2x / week    PT Duration 6 weeks    PT Treatment/Interventions ADLs/Self Care Home Management;Aquatic Therapy;Canalith Repostioning;DME Instruction;Gait training;Stair training;Functional mobility training;Patient/family education;Therapeutic activities;Orthotic Fit/Training;Therapeutic exercise;Balance training;Neuromuscular re-education;Vestibular    PT Next Visit Plan Did she use treadmill at home?  curb negotiation/safety; step downs onto soft/variety of surfaces EO/EC.  Getting in and out of bathtub.  Higher level balance    Consulted and Agree with Plan of Care Patient           Patient will benefit from skilled therapeutic intervention in order to improve the following deficits and impairments:  Abnormal gait, Decreased coordination, Difficulty walking, Dizziness, Decreased activity tolerance, Decreased balance, Decreased strength, Impaired sensation, Pain  Visit Diagnosis: Other abnormalities of gait and mobility  Muscle weakness (generalized)  Unsteadiness on feet  Repeated falls     Problem List Patient Active Problem List   Diagnosis Date Noted  . Impulsiveness 10/14/2019  . Mild episode of recurrent major depressive disorder ([place]) 09/04/2019  . Anxiety 08/16/2019  . 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  . 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 Status post radiation therapy 11/22/2018  . Seasonal and perennial allergic rhinitis 11/22/2018  . Seasonal allergic conjunctivitis 11/22/2018  . Acute sinusitis 11/22/2018  . Keratosis pilaris 10/05/2017  . Melanocytic nevi of trunk 10/05/2017  . Adverse food reaction 02/09/2017  . Gastroesophageal reflux disease 02/09/2017  . Mild intermittent asthma with acute exacerbation 11/19/2015  .  Mild persistent asthma without complication 52/47/9980  . Acute seasonal allergic rhinitis due to pollen 11/16/2015  . Anaphylactic shock due to adverse food reaction 11/16/2015  . 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  Type 1 diabetes mellitus without complication ([place]) 02/08/9357  . Dry mouth 11/16/2015  . Xerosis cutis 11/16/2015  . Nystagmus 08/15/2014  . Visual field defect 08/15/2014  . Disequilibrium 07/25/2014  . 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    Rico Junker, PT, DPT 10/31/19    3:42 PM    [facility] [address] [place], [name], [address] Phone: [phone]   Fax:  [phone]  Name: CONNEE IKNER MRN: [id number] Date of Birth: 29-Sep-1985

## 2019-10-31 NOTE — Telephone Encounter (Signed)
Patients mother noted that Lyndzie showed up to her ex-boyfriends home after a restraining order was placed. She notes that she brought Christmas trinkets to his home. He ex-boyfriend then had he IVC. Her mother noted that Saxon informed her that she believes that her ex-boyfriend had a pituitary tumor and she needed to inform him.  Her mother notes that Kristianna's impulsive behaviors and thought processes could potentially be because of having past brain tumor and potential brain damage.  She  however is concerned that Randall Hiss is impulsive behaviors will lead to her going to jail.  She notes that she has taken away her electronics and does not allow her to visit with friends at this time for her safety. Provider informed patient and her mother that Abilify, Vraylar, Rexulti, or Depakote may be options to help manage her impulsivity.  She notes she will follow-up with her endocrinologist and follow that up with provider.  No other concerns noted at this time.

## 2019-10-31 NOTE — Telephone Encounter (Signed)
Mom called wanting to speak with Eulis Canner NP re her daughter who is the patient. She would like to speak with her re something else rather than Abilify she can take for impulse issues. She had taken Abilify for three days and stopped it because she had a blood sugar crisis. She would like a list of meds she can run by her endocrinologist at [facility] before trying. Will inform Brittney NP of her concern.

## 2019-11-04 ENCOUNTER — Ambulatory Visit: Payer: Medicaid Other | Admitting: Physical Therapy

## 2019-11-04 ENCOUNTER — Other Ambulatory Visit: Payer: Self-pay

## 2019-11-04 ENCOUNTER — Encounter: Payer: Self-pay | Admitting: Physical Therapy

## 2019-11-04 DIAGNOSIS — R296 Repeated falls: Secondary | ICD-10-CM

## 2019-11-04 DIAGNOSIS — R2689 Other abnormalities of gait and mobility: Secondary | ICD-10-CM | POA: Diagnosis not present

## 2019-11-04 DIAGNOSIS — M6281 Muscle weakness (generalized): Secondary | ICD-10-CM

## 2019-11-04 DIAGNOSIS — R42 Dizziness and giddiness: Secondary | ICD-10-CM

## 2019-11-04 DIAGNOSIS — R2681 Unsteadiness on feet: Secondary | ICD-10-CM

## 2019-11-04 DIAGNOSIS — R209 Unspecified disturbances of skin sensation: Secondary | ICD-10-CM

## 2019-11-04 NOTE — Therapy (Signed)
[facility] [address] [place], [name], [address] Phone: [phone]   Fax:  [phone]  Physical Therapy Treatment  Patient Details  Name: Tracey Perkins MRN: [id number] Date of Birth: [birth year] Referring Provider (PT): Erin Sons, MD   Encounter Date: 11/04/2019   PT End of Session - 11/04/19 1218    Visit Number 12    Number of Visits 29    Date for PT Re-Evaluation 11/17/19    Authorization Type Approved for 8 more visits between 10/5 and 10/31    Authorization - Visit Number 3    Authorization - Number of Visits 8    PT Start Time 1109    PT Stop Time 1149    PT Time Calculation (min) 40 min    Activity Tolerance Patient tolerated treatment well    Behavior During Therapy [facility] for tasks assessed/performed           Past Medical History:  Diagnosis Date  . Asthma   . Brain tumor ([place])   . Diabetes mellitus without complication ([place])   . Eczema   . Food allergy   . Gastroparesis   . Hypokalemia   . Hypomagnesemia   . Hypothyroid   . Neuropathy   . Thyroid disease   . Vision changes     Past Surgical History:  Procedure Laterality Date  . BRAIN SURGERY    . Portacath placement and removed    . WISDOM TOOTH EXTRACTION      There were no vitals filed for this visit.   Subjective Assessment - 11/04/19 1113    Subjective Did not get to try the treadmill this past weekend, was doing painting in the garage.  Tried the stair exercise but it did not work well due to shorter depth of stair.    Pertinent History asthma, eczema, food allergy, polyneuropathy, uncontrolled type 1 diabetes, gastroparesis, h/o chronic L thalamic infarct, new R thalamic infarct since 2016, lymphocytic thyroiditis, s/p radiation therapy, chemo and 2 surgical resection for medulloblastoma, childhood, nystagmus, visual field defect, sensorineural hearing loss of bilat ears    Patient Stated Goals Getting back to  walking independently and improve balance.  Going back to the gym and use the treadmill.    Currently in Pain? No/denies                             [facility] Adult PT Treatment/Exercise - 11/04/19 1117      Ambulation/Gait   Curb 4: Min assist    Curb Details (indicate cue type and reason) Starting at top of platform performed walking forwards with step down curb with RLE and then LLE attempting to maintain forward momentum and controlling descent without UE support.  Required min A to perform and maintain balance when descending and when ascending due to pt reporting diplopia and dizziness today      Knee/Hip Exercises: Aerobic   Tread Mill For warm up performed walking on treadmill at 1.2 mph x 6 minutes with bilat UE support; improved sensation in feet performing treadmill.      Knee/Hip Exercises: Standing   Step Down Right;Left;Hand Hold: 2;Step Height: 6";Limitations;5 sets    Step Down Limitations 6" step downs in // bars for UE support; began with EO tapping down to solid surface > EC tapping down to solid surface > EC with a variety of compliant/uneven surfaces - blue foam, inclined foam, BOSU x 12 reps  each set with min A for balance and cues for safety and sequencing; on seated rest break due to feeling disoriented after having EC                  PT Education - 11/04/19 1217    Education Details removed step downs from HEP; take rollator to [employer] Wednesday    Person(s) Educated Patient    Methods Explanation    Comprehension Verbalized understanding            PT Short Term Goals - 09/13/19 1040      PT SHORT TERM GOAL #1   Title = LTG             PT Long Term Goals - 09/20/19 1107      PT LONG TERM GOAL #1   Title Patient will demonstrate ability to perform final HEP independently, walk on treadmill at home with supervision and return to the gym for ongoing wellness    Baseline HEP with supervision, not using treadmill, not going to  gym    Time 4    Period Weeks    Status Revised    Target Date 11/15/19      PT LONG TERM GOAL #2   Title Pt will improve DGI without AD to >/= 21/24 points to indicate decreased falls risk    Baseline 17/24 supervision without AD    Time 4    Period Weeks    Status Revised    Target Date 11/15/19      PT LONG TERM GOAL #3   Title Pt will demonstrate ability to perform conditions 1/2 on MCTSIB for 30 seconds and perform condition 3/4 for up to 10 seconds to indicate improved sensory integration    Baseline 30, 28, not able to perform 3 and 4    Time 4    Period Weeks    Status Revised    Target Date 11/15/19      PT LONG TERM GOAL #4   Title Pt will report no dizziness or headaches with bed mobility, transfers, head/body turns, bending down to the floor or looking up at the ceiling to decrease falls risk with ADL and mobility    Baseline mild dizziness with bed mobility, transfers and head/body turns during gait    Time 4    Period Weeks    Status On-going    Target Date 11/15/19      PT LONG TERM GOAL #5   Title Pt will be able to sit down in tub and stand back up with supervision; pt will be able to perform shower in stand up shower with supervision only without LOB to increase independence with ADL    Baseline walking without RW but still needs assistance to get down into and out of the tub (all the way down)    Time 4    Period Weeks    Status Revised    Target Date 11/15/19                 Plan - 11/04/19 1218    Clinical Impression Statement Pt feels treadmill is helpful for improving circulation and sensation in feet; performed for warm up today.  Continued to address dynamic balance impairments and sensory integration with step downs to variety of surfaces without use of vision and then performed regular curb negotiation to re-assess safety with performing.  Pt continues to require min A but reports improved balance and ability to perform  if she has a target to  stabilize her gaze on.  Will continue to address and progress towards LTG.    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, childhood, nystagmus, visual field defect, sensorineural hearing loss of bilat ears    Rehab Potential Good    PT Frequency 2x / week    PT Duration 6 weeks    PT Treatment/Interventions ADLs/Self Care Home Management;Aquatic Therapy;Canalith Repostioning;DME Instruction;Gait training;Stair training;Functional mobility training;Patient/family education;Therapeutic activities;Orthotic Fit/Training;Therapeutic exercise;Balance training;Neuromuscular re-education;Vestibular    PT Next Visit Plan How was state fair? treadmill to warm up.  Progress VOR.  curb negotiation/safety; step downs onto soft/variety of surfaces EO/EC.  Getting in and out of bathtub.  Higher level balance    Consulted and Agree with Plan of Care Patient           Patient will benefit from skilled therapeutic intervention in order to improve the following deficits and impairments:  Abnormal gait, Decreased coordination, Difficulty walking, Dizziness, Decreased activity tolerance, Decreased balance, Decreased strength, Impaired sensation, Pain  Visit Diagnosis: Other abnormalities of gait and mobility  Muscle weakness (generalized)  Unsteadiness on feet  Repeated falls  Dizziness and giddiness  Unspecified disturbances of skin sensation     Problem List Patient Active Problem List   Diagnosis Date Noted  . Impulsiveness 10/14/2019  . Mild episode of recurrent major depressive disorder ([place]) 09/04/2019  . Anxiety 08/16/2019  . 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  . 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 Status post radiation therapy 11/22/2018  . Seasonal and  perennial allergic rhinitis 11/22/2018  . Seasonal allergic conjunctivitis 11/22/2018  . Acute sinusitis 11/22/2018  . Keratosis pilaris 10/05/2017  . Melanocytic nevi of trunk 10/05/2017  . Adverse food reaction 02/09/2017  . Gastroesophageal reflux disease 02/09/2017  . Mild intermittent asthma with acute exacerbation 11/19/2015  . Mild persistent asthma without complication 54/65/0354  . Acute seasonal allergic rhinitis due to pollen 11/16/2015  . Anaphylactic shock due to adverse food reaction 11/16/2015  . 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 Type 1 diabetes mellitus without complication ([place]) 65/68/1275  . Dry mouth 11/16/2015  . Xerosis cutis 11/16/2015  . Nystagmus 08/15/2014  . Visual field defect 08/15/2014  . Disequilibrium 07/25/2014  . 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   Rico Junker, PT, DPT 11/04/19    12:23 PM    [facility] [address] [place], [name], [address] Phone: [phone]   Fax:  [phone]  Name: Tracey Perkins MRN: [id number] Date of Birth: May 16, 1985

## 2019-11-04 NOTE — Patient Instructions (Signed)
TREADMILL: Is safe to walk on the treadmill with supervision with both hands holding on.  1.2 mph start at 8 minutes.  0 elevation.   Every week increase time by 2 minutes.  Work up to 20-30 minutes and then start to increase speed gradually.    (This is in place of walking outside if weather is bad)    Gaze Stabilization: Sitting    Keeping eyes on target on wall 3 feet away, and move head side to side slowly for 8-10 repetitions. Do __2-3__ sessions per day.   Feet Together, Head Motion - Eyes Open    Chair behind you and chair or counter in front for support/safety.  With eyes open, feet together, move head slowly: up and down 5 repetitions, Perform head turns to the left and right 5 repetitions.. Repeat _2_ times per session.    Bending / Picking Up Objects    Sitting, place your water bottle in front of your feet.  Look down and keep your eyes focused on the top of your water bottle, slowly bend head down and pick up object on the floor. Return to upright position keeping your eyes on the water bottle.  Hold position until symptoms subside. Repeat __5__ times per session. Do __2__ sessions per day.   Push-Up (Sitting)    With a __4__ inch book under each hand, press down while lifting body - keep shoulders down. Use foot to help with balance but don't push up with legs. Hold __3-4__ seconds. Repeat ___5_ times. Do __2__ sessions per day.   EXTENSION: Standing - Resistance Band: Stable (Active)    Stand, right arm at side. Against red resistance band, draw arm backward, as far as possible, keeping elbow straight. Complete _2__ sets of __10_ repetitions.    REMOVED STEP DOWNS; NOT SAFE TO PERFORM ON STAIRS AT HOME

## 2019-11-07 ENCOUNTER — Other Ambulatory Visit: Payer: Self-pay

## 2019-11-07 ENCOUNTER — Ambulatory Visit: Payer: Medicaid Other | Admitting: Physical Therapy

## 2019-11-07 ENCOUNTER — Encounter: Payer: Self-pay | Admitting: Physical Therapy

## 2019-11-07 DIAGNOSIS — R209 Unspecified disturbances of skin sensation: Secondary | ICD-10-CM

## 2019-11-07 DIAGNOSIS — R2689 Other abnormalities of gait and mobility: Secondary | ICD-10-CM

## 2019-11-07 DIAGNOSIS — R42 Dizziness and giddiness: Secondary | ICD-10-CM

## 2019-11-07 DIAGNOSIS — R296 Repeated falls: Secondary | ICD-10-CM

## 2019-11-07 DIAGNOSIS — M6281 Muscle weakness (generalized): Secondary | ICD-10-CM

## 2019-11-07 DIAGNOSIS — R2681 Unsteadiness on feet: Secondary | ICD-10-CM

## 2019-11-07 NOTE — Therapy (Signed)
[facility] [address] [place], [name], [address] Phone: [phone]   Fax:  [phone]  Physical Therapy Treatment  Patient Details  Name: Tracey Perkins MRN: [id number] Date of Birth: 1985/07/14 Referring Provider (PT): Erin Sons, MD   Encounter Date: 11/07/2019   PT End of Session - 11/07/19 1124    Visit Number 13    Number of Visits 29    Date for PT Re-Evaluation 11/17/19    Authorization Type Approved for 8 more visits between 10/5 and 10/31    Authorization - Visit Number 4    Authorization - Number of Visits 8    PT Start Time 1028    PT Stop Time 1108    PT Time Calculation (min) 40 min    Activity Tolerance Patient tolerated treatment well    Behavior During Therapy [facility] for tasks assessed/performed           Past Medical History:  Diagnosis Date  . Asthma   . Brain tumor ([place])   . Diabetes mellitus without complication ([place])   . Eczema   . Food allergy   . Gastroparesis   . Hypokalemia   . Hypomagnesemia   . Hypothyroid   . Neuropathy   . Thyroid disease   . Vision changes     Past Surgical History:  Procedure Laterality Date  . BRAIN SURGERY    . Portacath placement and removed    . WISDOM TOOTH EXTRACTION      There were no vitals filed for this visit.   Subjective Assessment - 11/07/19 1033    Subjective Went to the [employer] yesterday.  It wasn't crowded but she had to use the RW due to distance; required multiple rest breaks.  Dizziness only at the end of the day when more people started showing up.  Feet were sore yesterday.  CBG was really high last night so she did not sleep well.    Pertinent History asthma, eczema, food allergy, polyneuropathy, uncontrolled type 1 diabetes, gastroparesis, h/o chronic L thalamic infarct, new R thalamic infarct since 2016, lymphocytic thyroiditis, s/p radiation therapy, chemo and 2 surgical resection for medulloblastoma,  childhood, nystagmus, visual field defect, sensorineural hearing loss of bilat ears    Patient Stated Goals Getting back to walking independently and improve balance.  Going back to the gym and use the treadmill.    Currently in Pain? Yes   feet are sore from yesterday walking                            [facility] Adult PT Treatment/Exercise - 11/07/19 1121      Knee/Hip Exercises: Standing   Step Down Right;Left;2 sets;10 reps;Hand Hold: 2;Step Height: 4"    Step Down Limitations Discussed how father could build a step for pt to practice controlled step downs since stairs are too narrow/shallow.  Took pictures of wooden step 18 x 13 x 4 inches and had pt practice alternating step downs on 4" step with one UE support.  Used mirror for visual feedback on posture and maintaining more upright postures and decreasing anterior lean.           Vestibular Treatment/Exercise - 11/07/19 1043      Vestibular Treatment/Exercise   Vestibular Treatment Provided Gaze    Gaze Exercises X1 Viewing Horizontal;X1 Viewing Vertical      X1 Viewing Horizontal   Foot Position Seated and then standing  with feet apart, no UE support    Reps 5    Comments 10 reps in sitting progressed to 30 seconds with verbal cues for technique/ROM and sequencing.  30 seconds in standing, no dizziness but increased sway - had to sit down to re-center.  Cues to keep COG shifted to the middle - pt stands shifted to R      X1 Viewing Vertical   Foot Position Seated and then standing with feet apart, no UE support    Reps 5    Comments 10 repetitions in sitting progressed to 30 seconds;  30 seconds in standing - no dizziness but cues to keep COG shifted to the middle                 PT Education - 11/07/19 1123    Education Details updated x1 viewing to standing, measurements for box for father to build for step down training    Person(s) Educated Patient    Methods Explanation;Demonstration;Handout     Comprehension Verbalized understanding;Returned demonstration          Box Measurements: 13 x 18 x 4            TREADMILL: Is safe to walk on the treadmill with supervision with both hands holding on.  1.2 mph start at 8 minutes.  0 elevation.   Every week increase time by 2 minutes.  Work up to 20-30 minutes and then start to increase speed gradually.    (This is in place of walking outside if weather is bad)   Gaze Stabilization: Standing Feet Apart    Feet shoulder width apart, keeping eyes on target on wall __3__ feet away, tilt head down 15-30 and move head side to side for _30___ seconds. Repeat while moving head up and down for __30__ seconds. Do ___2_ sessions per day.   Feet Together, Head Motion - Eyes Open    Chair behind you and chair or counter in front for support/safety.  With eyes open, feet together, move head slowly: up and down 5 repetitions, Perform head turns to the left and right 5 repetitions.. Repeat _2_ times per session.    Bending / Picking Up Objects    Sitting, place your water bottle in front of your feet.  Look down and keep your eyes focused on the top of your water bottle, slowly bend head down and pick up object on the floor. Return to upright position keeping your eyes on the water bottle.  Hold position until symptoms subside. Repeat __5__ times per session. Do __2__ sessions per day.   Push-Up (Sitting)    With a __4__ inch book under each hand, press down while lifting body - keep shoulders down. Use foot to help with balance but don't push up with legs. Hold __3-4__ seconds. Repeat ___5_ times. Do __2__ sessions per day.   EXTENSION: Standing - Resistance Band: Stable (Active)    Stand, right arm at side. Against red resistance band, draw arm backward, as far as possible, keeping elbow straight. Complete _2__ sets of __10_ repetitions.    REMOVED STEP DOWNS; NOT SAFE TO PERFORM ON STAIRS AT HOME    PT Short Term  Goals - 09/13/19 1040      PT SHORT TERM GOAL #1   Title = LTG             PT Long Term Goals - 09/20/19 1107      PT LONG TERM GOAL #1   Title  Patient will demonstrate ability to perform final HEP independently, walk on treadmill at home with supervision and return to the gym for ongoing wellness    Baseline HEP with supervision, not using treadmill, not going to gym    Time 4    Period Weeks    Status Revised    Target Date 11/15/19      PT LONG TERM GOAL #2   Title Pt will improve DGI without AD to >/= 21/24 points to indicate decreased falls risk    Baseline 17/24 supervision without AD    Time 4    Period Weeks    Status Revised    Target Date 11/15/19      PT LONG TERM GOAL #3   Title Pt will demonstrate ability to perform conditions 1/2 on MCTSIB for 30 seconds and perform condition 3/4 for up to 10 seconds to indicate improved sensory integration    Baseline 30, 28, not able to perform 3 and 4    Time 4    Period Weeks    Status Revised    Target Date 11/15/19      PT LONG TERM GOAL #4   Title Pt will report no dizziness or headaches with bed mobility, transfers, head/body turns, bending down to the floor or looking up at the ceiling to decrease falls risk with ADL and mobility    Baseline mild dizziness with bed mobility, transfers and head/body turns during gait    Time 4    Period Weeks    Status On-going    Target Date 11/15/19      PT LONG TERM GOAL #5   Title Pt will be able to sit down in tub and stand back up with supervision; pt will be able to perform shower in stand up shower with supervision only without LOB to increase independence with ADL    Baseline walking without RW but still needs assistance to get down into and out of the tub (all the way down)    Time 4    Period Weeks    Status Revised    Target Date 11/15/19                 Plan - 11/07/19 1125    Clinical Impression Statement Did not perform treadmill today due to pt  walking very long distances yesterday and reporting soreness in feet today.  Focused on progressing x1 viewing back to standing to continue to progress gaze stabilization and problem solved way for pt to safely practice step downs at home.  Will continue to address and progress towards LTG.    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, childhood, nystagmus, visual field defect, sensorineural hearing loss of bilat ears    Rehab Potential Good    PT Frequency 2x / week    PT Duration 6 weeks    PT Treatment/Interventions ADLs/Self Care Home Management;Aquatic Therapy;Canalith Repostioning;DME Instruction;Gait training;Stair training;Functional mobility training;Patient/family education;Therapeutic activities;Orthotic Fit/Training;Therapeutic exercise;Balance training;Neuromuscular re-education;Vestibular    PT Next Visit Plan Check goals, submit to [employer] for more visits, schedule more visits.  Did her dad have any questions about box to construct?  treadmill to warm up.  Progress VOR time/balance.  curb negotiation/safety; step downs onto soft/variety of surfaces EO/EC.  Getting in and out of bathtub.  Higher level balance  Consulted and Agree with Plan of Care Patient           Patient will benefit from skilled therapeutic intervention in order to improve the following deficits and impairments:  Abnormal gait, Decreased coordination, Difficulty walking, Dizziness, Decreased activity tolerance, Decreased balance, Decreased strength, Impaired sensation, Pain  Visit Diagnosis: Other abnormalities of gait and mobility  Muscle weakness (generalized)  Unsteadiness on feet  Repeated falls  Dizziness and giddiness  Unspecified disturbances of skin sensation     Problem  List Patient Active Problem List   Diagnosis Date Noted  . Impulsiveness 10/14/2019  . Mild episode of recurrent major depressive disorder ([place]) 09/04/2019  . Anxiety 08/16/2019  . 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  . 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 Status post radiation therapy 11/22/2018  . Seasonal and perennial allergic rhinitis 11/22/2018  . Seasonal allergic conjunctivitis 11/22/2018  . Acute sinusitis 11/22/2018  . Keratosis pilaris 10/05/2017  . Melanocytic nevi of trunk 10/05/2017  . Adverse food reaction 02/09/2017  . Gastroesophageal reflux disease 02/09/2017  . Mild intermittent asthma with acute exacerbation 11/19/2015  . Mild persistent asthma without complication 19/41/7408  . Acute seasonal allergic rhinitis due to pollen 11/16/2015  . Anaphylactic shock due to adverse food reaction 11/16/2015  . 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 Type 1 diabetes mellitus without complication ([place]) 14/48/1856  . Dry mouth 11/16/2015  . Xerosis cutis 11/16/2015  . Nystagmus 08/15/2014  . Visual field defect 08/15/2014  . Disequilibrium 07/25/2014  . 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    Rico Junker, PT, DPT 11/07/19    11:36 AM    [facility] [address] [place], [name], [address] Phone: [phone]   Fax:  [phone]  Name: Tracey Perkins MRN: [id number] Date of Birth: 21-May-1985

## 2019-11-07 NOTE — Patient Instructions (Addendum)
Box Measurements: 13 x 18 x 4            TREADMILL: Is safe to walk on the treadmill with supervision with both hands holding on.  1.2 mph start at 8 minutes.  0 elevation.   Every week increase time by 2 minutes.  Work up to 20-30 minutes and then start to increase speed gradually.    (This is in place of walking outside if weather is bad)   Gaze Stabilization: Standing Feet Apart    Feet shoulder width apart, keeping eyes on target on wall __3__ feet away, tilt head down 15-30 and move head side to side for _30___ seconds. Repeat while moving head up and down for __30__ seconds. Do ___2_ sessions per day.   Feet Together, Head Motion - Eyes Open    Chair behind you and chair or counter in front for support/safety.  With eyes open, feet together, move head slowly: up and down 5 repetitions, Perform head turns to the left and right 5 repetitions.. Repeat _2_ times per session.    Bending / Picking Up Objects    Sitting, place your water bottle in front of your feet.  Look down and keep your eyes focused on the top of your water bottle, slowly bend head down and pick up object on the floor. Return to upright position keeping your eyes on the water bottle.  Hold position until symptoms subside. Repeat __5__ times per session. Do __2__ sessions per day.   Push-Up (Sitting)    With a __4__ inch book under each hand, press down while lifting body - keep shoulders down. Use foot to help with balance but don't push up with legs. Hold __3-4__ seconds. Repeat ___5_ times. Do __2__ sessions per day.   EXTENSION: Standing - Resistance Band: Stable (Active)    Stand, right arm at side. Against red resistance band, draw arm backward, as far as possible, keeping elbow straight. Complete _2__ sets of __10_ repetitions.    REMOVED STEP DOWNS; NOT SAFE TO PERFORM ON STAIRS AT HOME

## 2019-11-12 ENCOUNTER — Ambulatory Visit: Payer: Medicaid Other | Admitting: Physical Therapy

## 2019-11-12 ENCOUNTER — Other Ambulatory Visit: Payer: Self-pay

## 2019-11-12 ENCOUNTER — Encounter: Payer: Self-pay | Admitting: Physical Therapy

## 2019-11-12 DIAGNOSIS — M6281 Muscle weakness (generalized): Secondary | ICD-10-CM

## 2019-11-12 DIAGNOSIS — R209 Unspecified disturbances of skin sensation: Secondary | ICD-10-CM

## 2019-11-12 DIAGNOSIS — R42 Dizziness and giddiness: Secondary | ICD-10-CM

## 2019-11-12 DIAGNOSIS — R2689 Other abnormalities of gait and mobility: Secondary | ICD-10-CM

## 2019-11-12 DIAGNOSIS — R296 Repeated falls: Secondary | ICD-10-CM

## 2019-11-12 DIAGNOSIS — R2681 Unsteadiness on feet: Secondary | ICD-10-CM

## 2019-11-12 NOTE — Therapy (Signed)
[facility] [address] [place], [name], [address] Phone: [phone]   Fax:  [phone]  Physical Therapy Treatment  Patient Details  Name: Tracey Perkins MRN: [id number] Date of Birth: 1985/02/28 Referring Provider (PT): Erin Sons, MD   Encounter Date: 11/12/2019   PT End of Session - 11/12/19 1248    Visit Number 14    Number of Visits 29    Date for PT Re-Evaluation 11/17/19    Authorization Type Approved for 8 more visits between 10/5 and 10/31    Authorization - Visit Number 5    Authorization - Number of Visits 8    PT Start Time 1020    PT Stop Time 1105    PT Time Calculation (min) 45 min    Activity Tolerance Patient tolerated treatment well    Behavior During Therapy [facility] for tasks assessed/performed           Past Medical History:  Diagnosis Date  . Asthma   . Brain tumor ([place])   . Diabetes mellitus without complication ([place])   . Eczema   . Food allergy   . Gastroparesis   . Hypokalemia   . Hypomagnesemia   . Hypothyroid   . Neuropathy   . Thyroid disease   . Vision changes     Past Surgical History:  Procedure Laterality Date  . BRAIN SURGERY    . Portacath placement and removed    . WISDOM TOOTH EXTRACTION      There were no vitals filed for this visit.   Subjective Assessment - 11/12/19 1027    Subjective Pt had a good weekend, went to nephew's birthday party.  LUE became very painful and sensitive, even to touch yesterday.  Is going to contact Physician about it.  Hasn't tried treadmill yet.  Dad is making her a platform for step downs.    Pertinent History asthma, eczema, food allergy, polyneuropathy, uncontrolled type 1 diabetes, gastroparesis, h/o chronic L thalamic infarct, new R thalamic infarct since 2016, lymphocytic thyroiditis, s/p radiation therapy, chemo and 2 surgical resection for medulloblastoma, childhood, nystagmus, visual field defect, sensorineural  hearing loss of bilat ears    Patient Stated Goals Getting back to walking independently and improve balance.  Going back to the gym and use the treadmill.    Currently in Pain? Yes              [facility] PT Assessment - 11/12/19 1034      Assessment   Medical Diagnosis Disequilibrium, Traumatic injury of head, multiple falls, gait abnormality    Referring Provider (PT) Erin Sons, MD    Onset Date/Surgical Date 06/07/19    Hand Dominance Right    Prior Therapy yes - acute and inpatient rehab at [facility]      Precautions   Precautions Other (comment)    Precaution Comments asthma, eczema, food allergy, polyneuropathy, uncontrolled type 1 diabetes, gastroparesis, h/o chronic L thalamic infarct, new R thalamic infarct since 2016, lymphocytic thyroiditis, s/p radiation therapy, chemo and 2 surgical resection for medulloblastoma, childhood, nystagmus, visual field defect, sensorineural hearing loss of bilat ears      Balance Screen   Has the patient fallen in the past 6 months Yes    How many times? fell this morning, socks slid on floor and pt hit the floor with her butt; no soreness or bruising      Prior Function   Level of Independence Independent      Observation/Other  Assessments   Focus on Therapeutic Outcomes (FOTO)  Not applicable - Medicaid      Ambulation/Gait   Ambulation/Gait Yes    Ambulation/Gait Assistance 5: Supervision    Ambulation/Gait Assistance Details dizziness and imbalance with changes in gait speed from regular > fast > slow     Ambulation Distance (Feet) 115 Feet    Assistive device None    Gait Pattern Step-through pattern;Decreased arm swing - right;Decreased arm swing - left;Decreased step length - right;Decreased step length - left;Decreased trunk rotation    Ambulation Surface Level;Indoor    Stairs Yes    Stairs Assistance 5: Supervision    Stairs Assistance Details (indicate cue type and reason) alternating to ascend and descend, improved  balance and control of descent; dizziness with turning at top    Stair Management Technique Two rails;Alternating pattern;Forwards    Number of Stairs 4    Height of Stairs 6      Dynamic Gait Index   Level Surface Mild Impairment    Change in Gait Speed Moderate Impairment    Gait with Horizontal Head Turns Normal    Gait with Vertical Head Turns Normal    Gait and Pivot Turn Normal    Step Over Obstacle Normal    Step Around Obstacles Normal    Steps Mild Impairment    Total Score 20    DGI comment: 20/24 lower falls risk               Vestibular Assessment - 11/12/19 1040      Balancemaster   Balancemaster Comment MCTSIB: 30 seconds on condition 1, 2.  Condition 3 able to hold for 5-6 seconds at a time.  Still unable to perform condition 4.      Positional Sensitivities   Sit to Supine No dizziness    Supine to Left Side Mild dizziness    Supine to Right Side No dizziness    Supine to Sitting Mild dizziness    Nose to Right Knee No dizziness   with use of spotting   Right Knee to Sitting No dizziness   with use of spotting   Nose to Left Knee No dizziness   with use of spotting   Left Knee to Sitting No dizziness   with use of spotting   Head Turning x 5 No dizziness   when using spotting   Head Nodding x 5 Mild dizziness    Pivot Right in Standing No dizziness    Pivot Left in Standing No dizziness    Rolling Right No dizziness    Rolling Left Mild dizziness                            PT Education - 11/12/19 1248    Education Details progress towards LTG; will request more visits today    Person(s) Educated Patient;Parent(s)    Methods Explanation    Comprehension Verbalized understanding            PT Short Term Goals - 09/13/19 1040      PT SHORT TERM GOAL #1   Title = LTG             PT Long Term Goals - 11/12/19 1102      PT LONG TERM GOAL #1   Title Patient will demonstrate ability to perform final HEP independently,  walk on treadmill at home with supervision and return to the gym for ongoing wellness  Baseline HEP with supervision, not using treadmill, not going to gym    Time 4    Period Weeks    Status Partially Met      PT LONG TERM GOAL #2   Title Pt will improve DGI without AD to >/= 21/24 points to indicate decreased falls risk    Baseline 20/24    Time 4    Period Weeks    Status Partially Met      PT LONG TERM GOAL #3   Title Pt will demonstrate ability to perform conditions 1/2 on MCTSIB for 30 seconds and perform condition 3/4 for up to 10 seconds to indicate improved sensory integration    Time 4    Period Weeks    Status Partially Met      PT LONG TERM GOAL #4   Title Pt will report no dizziness or headaches with bed mobility, transfers, head/body turns, bending down to the floor or looking up at the ceiling to decrease falls risk with ADL and mobility    Time 4    Period Weeks    Status Partially Met      PT LONG TERM GOAL #5   Title Pt will be able to sit down in tub and stand back up with supervision; pt will be able to perform shower in stand up shower with supervision only without LOB to increase independence with ADL    Baseline walking without RW but still needs assistance to get down into and out of the tub (all the way down)    Time 4    Period Weeks    Status On-going                 Plan - 11/12/19 1249    Clinical Impression Statement Performed assessment of progress towards LTG.  Pt is making steady progress and has partially met all LTG.  Pt demonstrates improved sensory integration with vision removed but continues to have greater difficulty on compliant surfaces.  Pt demonstrates decreased motion sensitivity when utilizing spotting for stabilization.  Pt also demonstrates decreased falls risk and improved balance during dynamic gait but continues to report dizziness with changes in gait speed.  Pt also continues to report increased difficulty with  functional transfers from floor.  Pt will benefit from continued skilled PT services to address these impairments to maximize functional mobility independence and decrease falls risk.    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, childhood, nystagmus, visual field defect, sensorineural hearing loss of bilat ears    Rehab Potential Good    PT Frequency 2x / week    PT Duration 6 weeks    PT Treatment/Interventions ADLs/Self Care Home Management;Aquatic Therapy;Canalith Repostioning;DME Instruction;Gait training;Stair training;Functional mobility training;Patient/family education;Therapeutic activities;Orthotic Fit/Training;Therapeutic exercise;Balance training;Neuromuscular re-education;Vestibular    PT Next Visit Plan Schedule more visits.  treadmill to warm up.  Changes in gait speed.  Progress VOR time/balance.  curb negotiation/safety; step downs onto soft/variety of surfaces EO/EC.  Getting in and out of bathtub.  Higher level balance    Consulted and Agree with Plan of Care Patient    Family Member Consulted parents           Patient will benefit from skilled therapeutic intervention in order to improve the following deficits and impairments:  Abnormal gait, Decreased coordination, Difficulty walking,  Dizziness, Decreased activity tolerance, Decreased balance, Decreased strength, Impaired sensation, Pain  Visit Diagnosis: Other abnormalities of gait and mobility  Muscle weakness (generalized)  Unsteadiness on feet  Repeated falls  Dizziness and giddiness  Unspecified disturbances of skin sensation     Problem List Patient Active Problem List   Diagnosis Date Noted  . Impulsiveness 10/14/2019  . Mild episode of recurrent major depressive disorder ([place])  09/04/2019  . Anxiety 08/16/2019  . 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  . 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 Status post radiation therapy 11/22/2018  . Seasonal and perennial allergic rhinitis 11/22/2018  . Seasonal allergic conjunctivitis 11/22/2018  . Acute sinusitis 11/22/2018  . Keratosis pilaris 10/05/2017  . Melanocytic nevi of trunk 10/05/2017  . Adverse food reaction 02/09/2017  . Gastroesophageal reflux disease 02/09/2017  . Mild intermittent asthma with acute exacerbation 11/19/2015  . Mild persistent asthma without complication 34/28/7681  . Acute seasonal allergic rhinitis due to pollen 11/16/2015  . Anaphylactic shock due to adverse food reaction 11/16/2015  . 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 Type 1 diabetes mellitus without complication ([place]) 15/72/6203  . Dry mouth 11/16/2015  . Xerosis cutis 11/16/2015  . Nystagmus 08/15/2014  . Visual field defect 08/15/2014  . Disequilibrium 07/25/2014  . 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    Rico Junker, PT, DPT 11/12/19    1:12 PM    [facility] [address] [place], [name], [address] Phone: [phone]   Fax:  [phone]  Name: Tracey Perkins MRN: [id number] Date of Birth: [birth year]

## 2019-11-15 ENCOUNTER — Ambulatory Visit: Payer: Medicaid Other | Admitting: Physical Therapy

## 2019-11-15 ENCOUNTER — Encounter: Payer: Self-pay | Admitting: Physical Therapy

## 2019-11-15 ENCOUNTER — Other Ambulatory Visit: Payer: Self-pay

## 2019-11-15 DIAGNOSIS — R2689 Other abnormalities of gait and mobility: Secondary | ICD-10-CM

## 2019-11-15 DIAGNOSIS — R2681 Unsteadiness on feet: Secondary | ICD-10-CM

## 2019-11-15 DIAGNOSIS — M6281 Muscle weakness (generalized): Secondary | ICD-10-CM

## 2019-11-15 DIAGNOSIS — R296 Repeated falls: Secondary | ICD-10-CM

## 2019-11-16 NOTE — Therapy (Signed)
[facility] [address] [place], [name], [address] Phone: [phone]   Fax:  [phone]  Physical Therapy Treatment  Patient Details  Name: Tracey Perkins MRN: [id number] Date of Birth: 01-01-1986 Referring Provider (PT): Erin Sons, MD   Encounter Date: 11/15/2019   PT End of Session - 11/16/19 1709    Visit Number 15    Number of Visits 26    Date for PT Re-Evaluation 12/27/19    Authorization Type Approved for 8 more visits between 10/5 and 10/31; requesting 12 more visits, 2x/week x 6    Authorization - Visit Number 6    Authorization - Number of Visits 8    PT Start Time 1238    PT Stop Time 1322    PT Time Calculation (min) 44 min    Activity Tolerance Patient tolerated treatment well    Behavior During Therapy [facility] for tasks assessed/performed           Past Medical History:  Diagnosis Date  . Asthma   . Brain tumor ([place])   . Diabetes mellitus without complication ([place])   . Eczema   . Food allergy   . Gastroparesis   . Hypokalemia   . Hypomagnesemia   . Hypothyroid   . Neuropathy   . Thyroid disease   . Vision changes     Past Surgical History:  Procedure Laterality Date  . BRAIN SURGERY    . Portacath placement and removed    . WISDOM TOOTH EXTRACTION      There were no vitals filed for this visit.   Subjective Assessment - 11/15/19 1242    Subjective Did the treadmill the other night, wasn't able to go as fast but it felt good.  Neuropathic pain in UE is better.  Physician said if it happens again to contact neurology.    Pertinent History asthma, eczema, food allergy, polyneuropathy, uncontrolled type 1 diabetes, gastroparesis, h/o chronic L thalamic infarct, new R thalamic infarct since 2016, lymphocytic thyroiditis, s/p radiation therapy, chemo and 2 surgical resection for medulloblastoma, childhood, nystagmus, visual field defect, sensorineural hearing loss of bilat ears     Patient Stated Goals Getting back to walking independently and improve balance.  Going back to the gym and use the treadmill.    Currently in Pain? No/denies                             [facility] Adult PT Treatment/Exercise - 11/15/19 1304      Ambulation/Gait   Ambulation/Gait Yes    Ambulation/Gait Assistance 4: Min guard    Ambulation/Gait Assistance Details changing walking speed between regular, fast, slow and sudden stops.  Pt continues to have greater difficulty with slow walking speed due to head/trunk flexion forward beyond BOS.      Ambulation Distance (Feet) 115 Feet   x 3 reps   Assistive device None    Gait Pattern Step-through pattern;Decreased arm swing - right;Decreased arm swing - left;Decreased trunk rotation;Trunk flexed;Wide base of support    Ambulation Surface Level;Indoor    Gait Comments Greatest difficulty with slow walking speed      Therapeutic Activites    Therapeutic Activities Other Therapeutic Activities;ADL's    ADL's Getting in and out of bathtub simulation.  Long sitting on floor with 4" box and then 6" box, with knees bent up pt cued to perform tricep push up and hold while walking feet in.  Required 6 reps with 4" box to sequence and shift hips back.  Able to perform 3 with 6" box.  Transitioned to sitting on 6" box and performing forward weight shifts to come to standing from box x 5 reps with one rest break.  Min-mod A to assist with full anterior weight shift over feet.    Other Therapeutic Activities adding more visits      Exercises   Exercises Other Exercises    Other Exercises  Long sitting hamstring stretch x 2 x 30 seconds to improve ROM in hips and low back to assist with getting out of bathtub                  PT Education - 11/16/19 1706    Education Details scheduling more visits; discussed plan to recheck goals and if pt would benefit from more therapy visits, would continue to add but pt is limited to 27 visits  a year.  Discussed appropriate role of therapy, patient's lifelong need for intermittent therapy due to neurological issues and rationale for taking breaks from therapy.  Discussed appropriateness of certain goals based on previous level of function.  Advised pt to perform dry run of pushing up from tub with shoes on.  Pt to practice with family present.    Person(s) Educated Patient;Parent(s)    Methods Explanation    Comprehension Verbalized understanding            PT Short Term Goals - 09/13/19 1040      PT SHORT TERM GOAL #1   Title = LTG             PT Long Term Goals - 11/12/19 1313      PT LONG TERM GOAL #1   Title Patient will demonstrate ability to perform final HEP independently, walk on treadmill at home with supervision and return to the gym for ongoing wellness    Baseline HEP with supervision, not using treadmill, not going to gym    Time 6    Period Weeks    Status Revised    Target Date 12/27/19      PT LONG TERM GOAL #2   Title Pt will improve DGI without AD to >/= 22/24 points to indicate decreased falls risk    Baseline 20/24    Time 6    Period Weeks    Status Revised    Target Date 12/27/19      PT LONG TERM GOAL #3   Title Pt will demonstrate ability to perform conditions 1/2 on MCTSIB for 30 seconds and perform condition 3/4 for up to 10 seconds to indicate improved sensory integration    Baseline 30 seconds for condition 1, 2; 5-6 seconds for condition 3, unable to perform condition 4    Time 6    Period Weeks    Status Revised    Target Date 12/27/19      PT LONG TERM GOAL #4   Title Pt will report no dizziness or headaches with bed mobility, transfers, head/body turns, bending down to the floor or looking up at the ceiling to decrease falls risk with ADL and mobility    Baseline mild dizziness with rolling to L and with quick head turns and body turns; uses spotting for bending down to the ground    Time 6    Period Weeks    Status Revised     Target Date 12/27/19      PT LONG TERM GOAL #  5   Title Pt will be able to sit down in tub and stand back up with supervision; pt will be able to perform shower in stand up shower with supervision only without LOB to increase independence with ADL    Baseline Requires supervision-min A to transition from sitting > quadruped > stand in shower    Time 6    Period Weeks    Status Revised    Target Date 12/27/19                 Plan - 11/16/19 1711    Clinical Impression Statement Treatment session focused on taking patient's goal of being able to rise from sitting in bathtub independently and breaking it down into multiple steps and addressing barriers to being able to perform (muscle length/ROM, weight shifting forwards, technique and sequencing).  Pt demonstrating improved lift with tricep push ups and ability to hold while walking feet in.  She also demonstrates improved forward weight shift to stand from low seat.  Also continued to address balance with changing walking speed.  Will continue to address and progress towards LTG.    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, childhood, nystagmus, visual field defect, sensorineural hearing loss of bilat ears    Rehab Potential Good    PT Frequency 2x / week    PT Duration 6 weeks    PT Treatment/Interventions ADLs/Self Care Home Management;Aquatic Therapy;Canalith Repostioning;DME Instruction;Gait training;Stair training;Functional mobility training;Patient/family education;Therapeutic activities;Orthotic Fit/Training;Therapeutic exercise;Balance training;Neuromuscular re-education;Vestibular    PT Next Visit Plan treadmill to warm up.  Changes in gait speed.  Progress VOR time/balance.  curb negotiation/safety;  step downs onto soft/variety of surfaces EO/EC. Breaking it into smaller segments: Getting in and out of bathtub.  Higher level balance    Consulted and Agree with Plan of Care Patient    Family Member Consulted parents           Patient will benefit from skilled therapeutic intervention in order to improve the following deficits and impairments:  Abnormal gait, Decreased coordination, Difficulty walking, Dizziness, Decreased activity tolerance, Decreased balance, Decreased strength, Impaired sensation, Pain  Visit Diagnosis: Other abnormalities of gait and mobility  Muscle weakness (generalized)  Unsteadiness on feet  Repeated falls     Problem List Patient Active Problem List   Diagnosis Date Noted  . Impulsiveness 10/14/2019  . Mild episode of recurrent major depressive disorder ([place]) 09/04/2019  . Anxiety 08/16/2019  . 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  . 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 Status post radiation therapy 11/22/2018  . Seasonal and perennial allergic rhinitis 11/22/2018  . Seasonal allergic conjunctivitis 11/22/2018  . Acute sinusitis 11/22/2018  . Keratosis pilaris 10/05/2017  . Melanocytic nevi of trunk 10/05/2017  . Adverse food reaction 02/09/2017  . Gastroesophageal reflux disease 02/09/2017  . Mild intermittent asthma with acute exacerbation 11/19/2015  . Mild persistent asthma without complication 35/57/3220  . Acute seasonal allergic rhinitis due to pollen 11/16/2015  . Anaphylactic shock due to adverse food reaction 11/16/2015  . 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 Type 1 diabetes mellitus without complication ([place]) 25/42/7062  . Dry mouth 11/16/2015  . Xerosis cutis 11/16/2015  . Nystagmus 08/15/2014  . Visual field defect 08/15/2014  . Disequilibrium 07/25/2014  . 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    Rico Junker, PT,  DPT 11/16/19    5:22 PM    [facility] [address] [place], [name], [address] Phone: [phone]   Fax:  [phone]  Name: SCOTTY PINDER MRN: [id number] Date of Birth: 09-30-1985

## 2019-11-19 ENCOUNTER — Ambulatory Visit (INDEPENDENT_AMBULATORY_CARE_PROVIDER_SITE_OTHER): Payer: Medicaid Other | Admitting: Clinical

## 2019-11-19 DIAGNOSIS — F330 Major depressive disorder, recurrent, mild: Secondary | ICD-10-CM | POA: Diagnosis not present

## 2019-11-22 NOTE — Progress Notes (Signed)
   THERAPIST PROGRESS NOTE  Session Time: 45 minutes  Participation Level: Active  Behavioral Response: CasualAlertDepressed  Type of Therapy: Individual Therapy  Treatment Goals addressed: Anxiety and Diagnosis: depression  Interventions: CBT  Summary:  Tracey Perkins is a 34 y.o. female who presents for the scheduled session oriented times five, appropriately dressed, and friendly. Client denied hallucinations and delusions. Client reported on today feeling okay. Client reported over the past couple of months she has dealt with the residual effects of the concussion she had. Client reported the doctors told her, her brain reacted in a way that caused to relearn motor functioning. Client reported she has dealt with accompanying stressors due to reported from friends a family that she has not been the same since her concussion. Client discussed her friends reported they felt used by her. Client reported she is unable to drive and previously had her ex boyfriend to take her to see friends but she can no longer see them as often. Client reported overall she has not had any major difficulties with her anxiety but she would like to talk to the psychiatrist about medication for depression. Client reported interaction with others can be difficult and has prevented her from interacting with others. Client reported lately she has been eating her emotions.    Suicidal/Homicidal: Nowithout intent/plan  Therapist Response:  Therapist began the session y asking the client how she has been since last seen for therapy. Therapist actively listened to the clients thoughts and feelings.' Therapist asked the client open ended questions to assess her depressive symptoms and how it has affected her behavior. Therapist used CBT to discuss the depression thought cycle and introducing a emotion regulation technique. Therapist gave the client two psycho education worksheet about the depressive thought cycle and  "checking the facts" to complete for homework.  Therapist assisted with scheduled the next appointment   Plan: Return again in 4 weeks for individual therapy.  Diagnosis: Mild episode of recurrent major depressive disorder    Special Ranes Y Petrita Blunck, LCSW 11/22/2019

## 2019-11-26 ENCOUNTER — Ambulatory Visit: Payer: Medicaid Other | Attending: Neurology | Admitting: Physical Therapy

## 2019-11-26 ENCOUNTER — Other Ambulatory Visit: Payer: Self-pay

## 2019-11-26 ENCOUNTER — Encounter: Payer: Self-pay | Admitting: Physical Therapy

## 2019-11-26 DIAGNOSIS — M6281 Muscle weakness (generalized): Secondary | ICD-10-CM | POA: Insufficient documentation

## 2019-11-26 DIAGNOSIS — R2681 Unsteadiness on feet: Secondary | ICD-10-CM | POA: Insufficient documentation

## 2019-11-26 DIAGNOSIS — R2689 Other abnormalities of gait and mobility: Secondary | ICD-10-CM | POA: Diagnosis present

## 2019-11-26 DIAGNOSIS — R296 Repeated falls: Secondary | ICD-10-CM | POA: Diagnosis present

## 2019-11-27 NOTE — Therapy (Signed)
[facility] [address] [place], [name], [address] Phone: [phone]   Fax:  [phone]  Physical Therapy Treatment  Patient Details  Name: Tracey Perkins MRN: [id number] Date of Birth: [birth year] Referring Provider (PT): Erin Sons, MD   Encounter Date: 11/26/2019   PT End of Session - 11/27/19 1840    Visit Number 16    Number of Visits 26    Date for PT Re-Evaluation 12/27/19    Authorization Type Approved for 8 more visits between 10/5 and 10/31; requesting 12 more visits, 2x/week x 6    Authorization Time Period 11-18-19 - 12-17-19    Authorization - Visit Number 1    Authorization - Number of Visits 6    PT Start Time 0933    PT Stop Time 1016    PT Time Calculation (min) 43 min    Activity Tolerance Patient tolerated treatment well    Behavior During Therapy [facility] for tasks assessed/performed           Past Medical History:  Diagnosis Date  . Asthma   . Brain tumor ([place])   . Diabetes mellitus without complication ([place])   . Eczema   . Food allergy   . Gastroparesis   . Hypokalemia   . Hypomagnesemia   . Hypothyroid   . Neuropathy   . Thyroid disease   . Vision changes     Past Surgical History:  Procedure Laterality Date  . BRAIN SURGERY    . Portacath placement and removed    . WISDOM TOOTH EXTRACTION      There were no vitals filed for this visit.                      [facility] Adult PT Treatment/Exercise - 11/27/19 0001      Ambulation/Gait   Ambulation/Gait Yes    Ambulation/Gait Assistance 5: Supervision    Ambulation/Gait Assistance Details 1.2 mph on treadmill    Assistive device Other (Comment)   treadmill   Gait Pattern Within Functional Limits    Stairs Yes    Stairs Assistance 5: Supervision    Stairs Assistance Details (indicate cue type and reason) cues to place toes of stance leg over edge of step for incr. ROM    Stair Management Technique  Forwards;Two rails;Alternating pattern    Number of Stairs 4    Height of Stairs 6    Curb 5: Supervision    Curb Details (indicate cue type and reason) 3 reps; cues to allow toes of stance leg to be placed over edge of curb      Neuro Re-ed    Neuro Re-ed Details  pt performed tap ups to 1st step 5 reps each with minimal UE support:  to 2nd step 10 reps  each foot with UE support prn      Knee/Hip Exercises: Standing   Heel Raises Both;1 set;10 reps    Forward Step Up Both;1 set;10 reps;Hand Hold: 1;Step Height: 6"    Step Down Both;1 set;Hand Hold: 2;Step Height: 6";10 reps               Balance Exercises - 11/27/19 0001      Balance Exercises: Standing   Marching Static;10 reps   on Airex   Other Standing Exercises pt performed standing on airex in corner - performed tracking ball clockwise and then counterclockwise 5 reps each direction for improved gaze stabilization  Tall kneeling on mat on floor - practiced transitioning from tall kneeling to 1/2 kneeling 3 reps on each leg - bringing each leg Up/down;  Pt able to perform floor to stand transfer without UE support with SBA     PT Short Term Goals - 09/13/19 1040      PT SHORT TERM GOAL #1   Title = LTG             PT Long Term Goals - 11/27/19 1846      PT LONG TERM GOAL #1   Title Patient will demonstrate ability to perform final HEP independently, walk on treadmill at home with supervision and return to the gym for ongoing wellness    Baseline HEP with supervision, not using treadmill, not going to gym    Time 6    Period Weeks    Status Revised      PT LONG TERM GOAL #2   Title Pt will improve DGI without AD to >/= 22/24 points to indicate decreased falls risk    Baseline 20/24    Time 6    Period Weeks    Status Revised      PT LONG TERM GOAL #3   Title Pt will demonstrate ability to perform conditions 1/2 on MCTSIB for 30 seconds and perform condition 3/4 for up to 10 seconds to  indicate improved sensory integration    Baseline 30 seconds for condition 1, 2; 5-6 seconds for condition 3, unable to perform condition 4    Time 6    Period Weeks    Status Revised      PT LONG TERM GOAL #4   Title Pt will report no dizziness or headaches with bed mobility, transfers, head/body turns, bending down to the floor or looking up at the ceiling to decrease falls risk with ADL and mobility    Baseline mild dizziness with rolling to L and with quick head turns and body turns; uses spotting for bending down to the ground    Time 6    Period Weeks    Status Revised      PT LONG TERM GOAL #5   Title Pt will be able to sit down in tub and stand back up with supervision; pt will be able to perform shower in stand up shower with supervision only without LOB to increase independence with ADL    Baseline Requires supervision-min A to transition from sitting > quadruped > stand in shower    Time 6    Period Weeks    Status Revised                 Plan - 11/27/19 1841    Clinical Impression Statement Pt had difficulty performing transitional movement from tall kneeling to 1/2 kneeling on both RLE and LLE.  Pt declined performing gaze stabilization exercises today, stating "my eyes feel funny today".  Pt able to maintain balance on compliant surface with minimal postural sway.  Cont with POC.    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, childhood, nystagmus, visual field defect, sensorineural hearing loss of bilat ears    Rehab Potential Good    PT Frequency 2x / week    PT Duration 6 weeks    PT Treatment/Interventions ADLs/Self Care Home Management;Aquatic Therapy;Canalith Repostioning;DME Instruction;Gait training;Stair training;Functional mobility  training;Patient/family education;Therapeutic activities;Orthotic Fit/Training;Therapeutic exercise;Balance training;Neuromuscular re-education;Vestibular    PT Next Visit Plan treadmill to warm up.  Changes in gait speed.  Progress VOR time/balance.  curb negotiation/safety; step downs onto soft/variety of surfaces EO/EC. Breaking it into smaller segments: Getting in and out of bathtub.  Higher level balance    Consulted and Agree with Plan of Care Patient    Family Member Consulted parents           Patient will benefit from skilled therapeutic intervention in order to improve the following deficits and impairments:  Abnormal gait, Decreased coordination, Difficulty walking, Dizziness, Decreased activity tolerance, Decreased balance, Decreased strength, Impaired sensation, Pain  Visit Diagnosis: Other abnormalities of gait and mobility  Muscle weakness (generalized)  Unsteadiness on feet     Problem List Patient Active Problem List   Diagnosis Date Noted  . Impulsiveness 10/14/2019  . Mild episode of recurrent major depressive disorder ([place]) 09/04/2019  . Anxiety 08/16/2019  . 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  . 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 Status post radiation therapy 11/22/2018  . Seasonal and perennial allergic rhinitis 11/22/2018  . Seasonal allergic conjunctivitis 11/22/2018  . Acute sinusitis 11/22/2018  . Keratosis pilaris 10/05/2017  . Melanocytic nevi of trunk 10/05/2017  . Adverse food reaction 02/09/2017  . Gastroesophageal reflux disease 02/09/2017  . Mild intermittent asthma with acute exacerbation 11/19/2015  . Mild persistent asthma without complication 83/15/1761  . Acute seasonal allergic rhinitis due to pollen 11/16/2015  . Anaphylactic shock due to adverse food reaction 11/16/2015  . 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 Type 1 diabetes mellitus without complication ([place]) 60/73/7106  . Dry mouth 11/16/2015  . Xerosis cutis 11/16/2015    . Nystagmus 08/15/2014  . Visual field defect 08/15/2014  . Disequilibrium 07/25/2014  . 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    Marlaine Arey, [place], PT 11/27/2019, 6:47 PM  [facility] [address] [place], [name], [address] Phone: [phone]   Fax:  [phone]  Name: Tracey Perkins MRN: [id number] Date of Birth: 1985/12/21

## 2019-11-28 ENCOUNTER — Ambulatory Visit: Payer: Medicaid Other | Admitting: Physical Therapy

## 2019-11-28 ENCOUNTER — Other Ambulatory Visit: Payer: Self-pay

## 2019-11-28 ENCOUNTER — Encounter: Payer: Self-pay | Admitting: Physical Therapy

## 2019-11-28 DIAGNOSIS — R2689 Other abnormalities of gait and mobility: Secondary | ICD-10-CM | POA: Diagnosis not present

## 2019-11-28 DIAGNOSIS — M6281 Muscle weakness (generalized): Secondary | ICD-10-CM

## 2019-11-28 DIAGNOSIS — R2681 Unsteadiness on feet: Secondary | ICD-10-CM

## 2019-11-29 NOTE — Therapy (Signed)
[facility] [address] [place], [name], [address] Phone: [phone]   Fax:  [phone]  Physical Therapy Treatment  Patient Details  Name: Tracey Perkins MRN: [id number] Date of Birth: 06-09-1985 Referring Provider (PT): Erin Sons, MD   Encounter Date: 11/28/2019   PT End of Session - 11/29/19 1220    Visit Number 17    Number of Visits 26    Date for PT Re-Evaluation 12/27/19    Authorization Type Approved for 8 more visits between 10/5 and 10/31; requesting 12 more visits, 2x/week x 6    Authorization Time Period 11-18-19 - 12-17-19  -- 6 visits approved    Authorization - Visit Number 2    Authorization - Number of Visits 6    PT Start Time [phone]    PT Stop Time 0930    PT Time Calculation (min) 44 min    Activity Tolerance Patient tolerated treatment well    Behavior During Therapy [facility] for tasks assessed/performed           Past Medical History:  Diagnosis Date  . Asthma   . Brain tumor ([place])   . Diabetes mellitus without complication ([place])   . Eczema   . Food allergy   . Gastroparesis   . Hypokalemia   . Hypomagnesemia   . Hypothyroid   . Neuropathy   . Thyroid disease   . Vision changes     Past Surgical History:  Procedure Laterality Date  . BRAIN SURGERY    . Portacath placement and removed    . WISDOM TOOTH EXTRACTION      There were no vitals filed for this visit.   Subjective Assessment - 11/28/19 0852    Subjective Pt states eyes "feel wild today" - pt states she is having some double vision today    Pertinent History asthma, eczema, food allergy, polyneuropathy, uncontrolled type 1 diabetes, gastroparesis, h/o chronic L thalamic infarct, new R thalamic infarct since 2016, lymphocytic thyroiditis, s/p radiation therapy, chemo and 2 surgical resection for medulloblastoma, childhood, nystagmus, visual field defect, sensorineural hearing loss of bilat ears    Patient  Stated Goals Getting back to walking independently and improve balance.  Going back to the gym and use the treadmill.    Currently in Pain? No/denies                             [facility] Adult PT Treatment/Exercise - 11/29/19 0001      Transfers   Transfers Sit to Stand;Stand to Sit    Sit to Stand 5: Supervision    Number of Reps Other reps (comment)   5   Comments feet on Airex - no UE support used      Ambulation/Gait   Ambulation/Gait Yes    Stair Management Technique One rail Right;Alternating pattern;Forwards    Number of Stairs 4    Height of Stairs 6    Curb 5: Supervision    Gait Comments 1.5 mph 5" on treadmill with UE support on bars               Balance Exercises - 11/29/19 0001      Balance Exercises: Standing   Standing Eyes Opened Narrow base of support (BOS);Foam/compliant surface;Head turns;Wide (BOA)    Standing Eyes Closed Wide (BOA);Head turns;Solid surface;5 reps   horizontal & vertical    Rockerboard Anterior/posterior;Lateral;EO;EC;10 reps;Intermittent UE support    Step  Ups Forward;6 inch;UE support 2    Partial Tandem Stance Eyes open;2 reps   20 secs   Step Over Hurdles / Cones stepping over balance beam 5 reps each leg with minimal UE support    Marching Foam/compliant surface;Solid surface;Static;Head turns;Intermittent upper extremity assist   horizontal and vertical head turns   Heel Raises Both;10 reps   minimal UE support on // bars   Other Standing Exercises Pt performed tap ups to 1st step 5 reps each, then to 2nd step 5 reps each with minimal UE support               PT Short Term Goals - 09/13/19 1040      PT SHORT TERM GOAL #1   Title = LTG             PT Long Term Goals - 11/29/19 1235      PT LONG TERM GOAL #1   Title Patient will demonstrate ability to perform final HEP independently, walk on treadmill at home with supervision and return to the gym for ongoing wellness    Baseline HEP with  supervision, not using treadmill, not going to gym    Time 6    Period Weeks    Status Revised      PT LONG TERM GOAL #2   Title Pt will improve DGI without AD to >/= 22/24 points to indicate decreased falls risk    Baseline 20/24    Time 6    Period Weeks    Status Revised      PT LONG TERM GOAL #3   Title Pt will demonstrate ability to perform conditions 1/2 on MCTSIB for 30 seconds and perform condition 3/4 for up to 10 seconds to indicate improved sensory integration    Baseline 30 seconds for condition 1, 2; 5-6 seconds for condition 3, unable to perform condition 4    Time 6    Period Weeks    Status Revised      PT LONG TERM GOAL #4   Title Pt will report no dizziness or headaches with bed mobility, transfers, head/body turns, bending down to the floor or looking up at the ceiling to decrease falls risk with ADL and mobility    Baseline mild dizziness with rolling to L and with quick head turns and body turns; uses spotting for bending down to the ground    Time 6    Period Weeks    Status Revised      PT LONG TERM GOAL #5   Title Pt will be able to sit down in tub and stand back up with supervision; pt will be able to perform shower in stand up shower with supervision only without LOB to increase independence with ADL    Baseline Requires supervision-min A to transition from sitting > quadruped > stand in shower    Time 6    Period Weeks    Status Revised                 Plan - 11/29/19 1221    Clinical Impression Statement Pt continues to have difficulty maintaining balance when stepping off curb or with stepping down from one surface to another - pt does better with cues to step off slowly and stabilized on one leg prior to stepping down with other leg.  Pt also continues to have difficulty maintaining balance with eyes closed, due to decreased somtosensory input due to neuropathy and decr. vestibular input.  Cont with POC.    Comorbidities asthma, eczema, food  allergy, polyneuropathy, uncontrolled type 1 diabetes, gastroparesis, h/o chronic L thalamic infarct, new R thalamic infarct since 2016, lymphocytic thyroiditis, s/p radiation therapy, chemo and 2 surgical resection for medulloblastoma, childhood, nystagmus, visual field defect, sensorineural hearing loss of bilat ears    Rehab Potential Good    PT Frequency 2x / week    PT Duration 6 weeks    PT Treatment/Interventions ADLs/Self Care Home Management;Aquatic Therapy;Canalith Repostioning;DME Instruction;Gait training;Stair training;Functional mobility training;Patient/family education;Therapeutic activities;Orthotic Fit/Training;Therapeutic exercise;Balance training;Neuromuscular re-education;Vestibular    PT Next Visit Plan treadmill to warm up.  Changes in gait speed.  Progress VOR time/balance.  curb negotiation/safety; step downs onto soft/variety of surfaces EO/EC. Breaking it into smaller segments: Getting in and out of bathtub.  Higher level balance    Consulted and Agree with Plan of Care Patient    Family Member Consulted parents           Patient will benefit from skilled therapeutic intervention in order to improve the following deficits and impairments:  Abnormal gait, Decreased coordination, Difficulty walking, Dizziness, Decreased activity tolerance, Decreased balance, Decreased strength, Impaired sensation, Pain  Visit Diagnosis: Other abnormalities of gait and mobility  Muscle weakness (generalized)  Unsteadiness on feet     Problem List Patient Active Problem List   Diagnosis Date Noted  . Impulsiveness 10/14/2019  . Mild episode of recurrent major depressive disorder ([place]) 09/04/2019  . Anxiety 08/16/2019  . 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  . 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 Status post radiation therapy 11/22/2018  . Seasonal and perennial allergic rhinitis 11/22/2018  . Seasonal allergic conjunctivitis 11/22/2018  . Acute sinusitis 11/22/2018    . Keratosis pilaris 10/05/2017  . Melanocytic nevi of trunk 10/05/2017  . Adverse food reaction 02/09/2017  . Gastroesophageal reflux disease 02/09/2017  . Mild intermittent asthma with acute exacerbation 11/19/2015  . Mild persistent asthma without complication 75/10/2583  . Acute seasonal allergic rhinitis due to pollen 11/16/2015  . Anaphylactic shock due to adverse food reaction 11/16/2015  . 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 Type 1 diabetes mellitus without complication ([place]) 27/78/2423  . Dry mouth 11/16/2015  . Xerosis cutis 11/16/2015  . Nystagmus 08/15/2014  . Visual field defect 08/15/2014  . Disequilibrium 07/25/2014  . 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    Vihan Santagata, [place], PT 11/29/2019, 12:36 PM  [facility] [address] [place], [name], [address] Phone: [phone]   Fax:  [phone]  Name: Tracey Perkins MRN: [id number] Date of Birth: 12/07/1985

## 2019-12-02 ENCOUNTER — Other Ambulatory Visit: Payer: Self-pay

## 2019-12-02 ENCOUNTER — Encounter: Payer: Self-pay | Admitting: Physical Therapy

## 2019-12-02 ENCOUNTER — Ambulatory Visit: Payer: Medicaid Other | Admitting: Physical Therapy

## 2019-12-02 DIAGNOSIS — R2689 Other abnormalities of gait and mobility: Secondary | ICD-10-CM | POA: Diagnosis not present

## 2019-12-02 DIAGNOSIS — M6281 Muscle weakness (generalized): Secondary | ICD-10-CM

## 2019-12-02 DIAGNOSIS — R2681 Unsteadiness on feet: Secondary | ICD-10-CM

## 2019-12-03 NOTE — Therapy (Signed)
[facility] [address] [place], [name], [address] Phone: [phone]   Fax:  [phone]  Physical Therapy Treatment  Patient Details  Name: Tracey Perkins MRN: [id number] Date of Birth: [birth year] Referring Provider (PT): Erin Sons, MD   Encounter Date: 12/02/2019   PT End of Session - 12/03/19 1922    Visit Number 18    Number of Visits 26    Date for PT Re-Evaluation 12/27/19    Authorization Type Approved for 8 more visits between 10/5 and 10/31; requesting 12 more visits, 2x/week x 6    Authorization Time Period 11-18-19 - 12-17-19  -- 6 visits approved    Authorization - Visit Number 3    Authorization - Number of Visits 6    PT Start Time 0847    PT Stop Time 0930    PT Time Calculation (min) 43 min    Activity Tolerance Patient tolerated treatment well    Behavior During Therapy [facility] for tasks assessed/performed           Past Medical History:  Diagnosis Date   Asthma    Brain tumor ([place])    Diabetes mellitus without complication ([place])    Eczema    Food allergy    Gastroparesis    Hypokalemia    Hypomagnesemia    Hypothyroid    Neuropathy    Thyroid disease    Vision changes     Past Surgical History:  Procedure Laterality Date   BRAIN SURGERY     Portacath placement and removed     WISDOM TOOTH EXTRACTION      There were no vitals filed for this visit.                      [facility] Adult PT Treatment/Exercise - 12/03/19 0001      Transfers   Transfers Sit to Stand    Sit to Stand 5: Supervision    Number of Reps Other reps (comment)   5   Comments feet on Airex - no UE support used   from standard chair     Ambulation/Gait   Ambulation/Gait Yes    Gait Comments 1.5 mph 5" on treadmill with UE support on bars      Knee/Hip Exercises: Stretches   Active Hamstring Stretch Both;1 rep;30 seconds    Gastroc Stretch Both;1 rep;30 seconds    heels off edge of step     Knee/Hip Exercises: Standing   Forward Step Up Right;Left;1 set;5 reps;Step Height: 6";Hand Hold: 0   CGA to min assist needed for recovery of LOB   Other Standing Knee Exercises tap ups to 1st step 5 reps; tap ups to 2nd step without rail 5 reps each foot                Balance Exercises - 12/03/19 0001      Balance Exercises: Standing   Rockerboard Anterior/posterior;Lateral;EO;EC;10 reps;Intermittent UE support;Head turns    Step Ups Forward;6 inch;UE support 2    Marching Foam/compliant surface;Solid surface;Static;Head turns;Intermittent upper extremity assist   horizontal and vertical head turns   Other Standing Exercises Pt performed amb. approx. 94' making circles with ball - clockwise direction - tracking with eyes with head moving for improved gaze stabilization    Other Standing Exercises Comments Pt performed amb. 30' with horizontal head turns; amb. 30' with vertical head turns with SBA  PT Short Term Goals - 12/02/19 0908      PT SHORT TERM GOAL #1   Title = LTG             PT Long Term Goals - 12/02/19 0908      PT LONG TERM GOAL #1   Title Patient will demonstrate ability to perform final HEP independently, walk on treadmill at home with supervision and return to the gym for ongoing wellness    Baseline HEP with supervision, not using treadmill, not going to gym    Time 6    Period Weeks    Status Revised      PT LONG TERM GOAL #2   Title Pt will improve DGI without AD to >/= 22/24 points to indicate decreased falls risk    Baseline 20/24    Time 6    Period Weeks    Status Revised      PT LONG TERM GOAL #3   Title Pt will demonstrate ability to perform conditions 1/2 on MCTSIB for 30 seconds and perform condition 3/4 for up to 10 seconds to indicate improved sensory integration    Baseline 30 seconds for condition 1, 2; 5-6 seconds for condition 3, unable to perform condition 4    Time 6    Period  Weeks    Status Revised      PT LONG TERM GOAL #4   Title Pt will report no dizziness or headaches with bed mobility, transfers, head/body turns, bending down to the floor or looking up at the ceiling to decrease falls risk with ADL and mobility    Baseline mild dizziness with rolling to L and with quick head turns and body turns; uses spotting for bending down to the ground    Time 6    Period Weeks    Status Revised      PT LONG TERM GOAL #5   Title Pt will be able to sit down in tub and stand back up with supervision; pt will be able to perform shower in stand up shower with supervision only without LOB to increase independence with ADL    Baseline Requires supervision-min A to transition from sitting > quadruped > stand in shower    Time 6    Period Weeks    Status Revised                 Plan - 12/03/19 1913    Clinical Impression Statement Pt continues to have unsteadiness with deviation in path with head turns with ambulation.  Pt is demonstrating progress with maintaining balance on compliant surface, but continues to have more difficulty with EC than with EO, due to decreased vestibular input in maintaining balance.    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, childhood, nystagmus, visual field defect, sensorineural hearing loss of bilat ears    Rehab Potential Good    PT Frequency 2x / week    PT Duration 6 weeks    PT Treatment/Interventions ADLs/Self Care Home Management;Aquatic Therapy;Canalith Repostioning;DME Instruction;Gait training;Stair training;Functional mobility training;Patient/family education;Therapeutic activities;Orthotic Fit/Training;Therapeutic exercise;Balance training;Neuromuscular re-education;Vestibular    PT Next Visit Plan  Cont with high level balance and gait/vestibular activiities    Consulted and Agree with Plan of Care Patient    Family Member Consulted parents  Patient will benefit from skilled therapeutic intervention in order to improve the following deficits and impairments:  Abnormal gait, Decreased coordination, Difficulty walking, Dizziness, Decreased activity tolerance, Decreased balance, Decreased strength, Impaired sensation, Pain  Visit Diagnosis: Other abnormalities of gait and mobility  Muscle weakness (generalized)  Unsteadiness on feet     Problem List Patient Active Problem List   Diagnosis Date Noted   Impulsiveness 10/14/2019   Mild episode of recurrent major depressive disorder ([place]) 09/04/2019   Anxiety 08/16/2019   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   Status post radiation therapy 11/22/2018   Seasonal and perennial allergic rhinitis 11/22/2018   Seasonal allergic conjunctivitis 11/22/2018   Acute sinusitis 11/22/2018   Keratosis pilaris 10/05/2017   Melanocytic nevi of trunk 10/05/2017   Adverse food reaction 02/09/2017   Gastroesophageal reflux disease 02/09/2017   Mild intermittent asthma with acute exacerbation 11/19/2015   Mild persistent asthma without complication 91/50/5697   Acute seasonal allergic rhinitis due to pollen 11/16/2015   Anaphylactic shock due to adverse food reaction 11/16/2015   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 [place], [name], [address] Phone: [phone]   Fax:   [phone]  Name: Tracey Perkins MRN: [id number] Date of Birth: 02-22-1985

## 2019-12-05 ENCOUNTER — Encounter: Payer: Self-pay | Admitting: Physical Therapy

## 2019-12-05 ENCOUNTER — Other Ambulatory Visit: Payer: Self-pay

## 2019-12-05 ENCOUNTER — Ambulatory Visit: Payer: Medicaid Other | Admitting: Physical Therapy

## 2019-12-05 DIAGNOSIS — R2689 Other abnormalities of gait and mobility: Secondary | ICD-10-CM

## 2019-12-05 DIAGNOSIS — R296 Repeated falls: Secondary | ICD-10-CM

## 2019-12-05 DIAGNOSIS — M6281 Muscle weakness (generalized): Secondary | ICD-10-CM

## 2019-12-05 DIAGNOSIS — R2681 Unsteadiness on feet: Secondary | ICD-10-CM

## 2019-12-05 NOTE — Therapy (Signed)
[facility] [address] [place], [name], [address] Phone: [phone]   Fax:  [phone]  Physical Therapy Treatment  Patient Details  Name: Tracey Perkins MRN: [id number] Date of Birth: 07-08-1985 Referring Provider (PT): Erin Sons, MD   Encounter Date: 12/05/2019   PT End of Session - 12/05/19 1124    Visit Number 19    Number of Visits 26    Date for PT Re-Evaluation 12/17/19    Authorization Type 11-18-19 - 12-17-19  -- 6 visits approved    Authorization - Visit Number 4    Authorization - Number of Visits 6    PT Start Time 1017    PT Stop Time 1100    PT Time Calculation (min) 43 min    Activity Tolerance Patient tolerated treatment well    Behavior During Therapy [facility] for tasks assessed/performed           Past Medical History:  Diagnosis Date  . Asthma   . Brain tumor ([facility])   . Diabetes mellitus without complication ([place])   . Eczema   . Food allergy   . Gastroparesis   . Hypokalemia   . Hypomagnesemia   . Hypothyroid   . Neuropathy   . Thyroid disease   . Vision changes     Past Surgical History:  Procedure Laterality Date  . BRAIN SURGERY    . Portacath placement and removed    . WISDOM TOOTH EXTRACTION      There were no vitals filed for this visit.   Subjective Assessment - 12/05/19 1022    Subjective Feeling a little panicky with mask today.  Traded for paper mask.  Had a good birthday the other day.  Is going to the movies with friends this weekend.  Has been using step dad built for step downs.  Has been able to get out the tub 2-3 times but it is still really hard.    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  Going back to the gym and use the treadmill.                             [facility] Adult PT Treatment/Exercise - 12/05/19 1026      Transfers   Transfers Floor to Transfer    Floor to Transfer 4: Min assist;3: Mod assist;With upper extremity assist;Multiple attempts    Floor to Transfer Details (indicate cue type and reason) continued to review how to rise from sitting down in bath tub.  Placed box beside pt 14" high and mat on other side to simulate sides of bathtub.  Attempted to push up through bilat UE and walk feet back to stand but pt still unable to shift hips backwards and lean forwards.  Pt continues to shift hips forwards.  Reviewed how to lean laterally on one arm and tuck one foot under, rise on one LE and then shift other foot under BOS.  Pt able to return demonstrate x 2 reps with min A.        Knee/Hip Exercises: Stretches   Active Hamstring Stretch Right;Left;2 reps;30 seconds  Active Hamstring Stretch Limitations In long sitting performing one LE at a time      Knee/Hip Exercises: Aerobic   Tread Mill Started with 1.92mph with 1% incline and then quickly progressed back up to 1.5 mph for 8 minutes total with bilat UE support.  Verbal cues for increased stance time and step length.  Also added in head turn to L and R slowly x 7 reps.                  PT Education - 12/05/19 1123    Education Details transfer out of tub training    Person(s) Educated Patient    Methods Explanation    Comprehension Need further instruction            PT Short Term Goals - 12/02/19 0908      PT SHORT TERM GOAL #1   Title = LTG             PT Long Term Goals - 12/02/19 0908      PT LONG TERM GOAL #1   Title Patient will demonstrate ability to perform final HEP independently, walk on treadmill at home with supervision  and return to the gym for ongoing wellness    Baseline HEP with supervision, not using treadmill, not going to gym    Time 6    Period Weeks    Status Revised      PT LONG TERM GOAL #2   Title Pt will improve DGI without AD to >/= 22/24 points to indicate decreased falls risk    Baseline 20/24    Time 6    Period Weeks    Status Revised      PT LONG TERM GOAL #3   Title Pt will demonstrate ability to perform conditions 1/2 on MCTSIB for 30 seconds and perform condition 3/4 for up to 10 seconds to indicate improved sensory integration    Baseline 30 seconds for condition 1, 2; 5-6 seconds for condition 3, unable to perform condition 4    Time 6    Period Weeks    Status Revised      PT LONG TERM GOAL #4   Title Pt will report no dizziness or headaches with bed mobility, transfers, head/body turns, bending down to the floor or looking up at the ceiling to decrease falls risk with ADL and mobility    Baseline mild dizziness with rolling to L and with quick head turns and body turns; uses spotting for bending down to the ground    Time 6    Period Weeks    Status Revised      PT LONG TERM GOAL #5   Title Pt will be able to sit down in tub and stand back up with supervision; pt will be able to perform shower in stand up shower with supervision only without LOB to increase independence with ADL    Baseline Requires supervision-min A to transition from sitting > quadruped > stand in shower    Time 6    Period Weeks    Status Revised                 Plan - 12/05/19 1124    Clinical Impression Statement Continued to progress endurance training on treadmill with slight increase in incline, speed and duration.  Pt tolerated well.  Continued to review safe ways to transfer out of bathtub.  Pt able to perform modified technique but continues to require close supervision  and assistance for safety and balance.    Comorbidities asthma, eczema, food allergy, polyneuropathy, uncontrolled  type 1 diabetes, gastroparesis, h/o chronic L thalamic infarct, new R thalamic infarct since 2016, lymphocytic thyroiditis, s/p radiation therapy, chemo and 2 surgical resection for medulloblastoma, childhood, nystagmus, visual field defect, sensorineural hearing loss of bilat ears    Rehab Potential Good    PT Frequency 2x / week    PT Duration 6 weeks    PT Treatment/Interventions ADLs/Self Care Home Management;Aquatic Therapy;Canalith Repostioning;DME Instruction;Gait training;Stair training;Functional mobility training;Patient/family education;Therapeutic activities;Orthotic Fit/Training;Therapeutic exercise;Balance training;Neuromuscular re-education;Vestibular    PT Next Visit Plan Check LTG and request 4-6 more visits (to total 27?)  Continue to progress treadmill training; higher level balance and vestibular training, getting out of bathtub training    Consulted and Agree with Plan of Care Patient    Family Member Consulted parents           Patient will benefit from skilled therapeutic intervention in order to improve the following deficits and impairments:  Abnormal gait, Decreased coordination, Difficulty walking, Dizziness, Decreased activity tolerance, Decreased balance, Decreased strength, Impaired sensation, Pain  Visit Diagnosis: Other abnormalities of gait and mobility  Muscle weakness (generalized)  Unsteadiness on feet  Repeated falls     Problem List Patient Active Problem List   Diagnosis Date Noted  . Impulsiveness 10/14/2019  . Mild episode of recurrent major depressive disorder ([place]) 09/04/2019  . Anxiety 08/16/2019  . 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  . 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 Status post radiation therapy 11/22/2018  . Seasonal and perennial allergic rhinitis 11/22/2018  . Seasonal allergic conjunctivitis 11/22/2018  . Acute sinusitis 11/22/2018  . Keratosis pilaris 10/05/2017  . Melanocytic nevi of trunk 10/05/2017  . Adverse  food reaction 02/09/2017  . Gastroesophageal reflux disease 02/09/2017  . Mild intermittent asthma with acute exacerbation 11/19/2015  . Mild persistent asthma without complication 76/54/6503  . Acute seasonal allergic rhinitis due to pollen 11/16/2015  . Anaphylactic shock due to adverse food reaction 11/16/2015  . 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 Type 1 diabetes mellitus without complication ([place]) 54/65/6812  . Dry mouth 11/16/2015  . Xerosis cutis 11/16/2015  . Nystagmus 08/15/2014  . Visual field defect 08/15/2014  . Disequilibrium 07/25/2014  . 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    Rico Junker, PT, DPT 12/05/19    11:43 AM    [facility] [address] [place], [name], [address] Phone: [phone]   Fax:  [phone]  Name: NALEA SALCE MRN: [id number] Date of Birth: 04-09-1985

## 2019-12-09 ENCOUNTER — Ambulatory Visit: Payer: Medicaid Other | Admitting: Physical Therapy

## 2019-12-09 ENCOUNTER — Encounter: Payer: Self-pay | Admitting: Physical Therapy

## 2019-12-09 ENCOUNTER — Other Ambulatory Visit: Payer: Self-pay

## 2019-12-09 DIAGNOSIS — R2689 Other abnormalities of gait and mobility: Secondary | ICD-10-CM

## 2019-12-09 DIAGNOSIS — M6281 Muscle weakness (generalized): Secondary | ICD-10-CM

## 2019-12-09 DIAGNOSIS — R2681 Unsteadiness on feet: Secondary | ICD-10-CM

## 2019-12-09 NOTE — Therapy (Signed)
[facility] [address] [place], [name], [address] Phone: [phone]   Fax:  [phone]  Physical Therapy Treatment and Recertification   Patient Details  Name: Tracey Perkins MRN: [id number] Date of Birth: Sep 21, 1985 Referring Provider (PT): Erin Sons, MD   Encounter Date: 12/09/2019   PT End of Session - 12/09/19 1154    Visit Number 20    Number of Visits 26    Date for PT Re-Evaluation 12/30/19    Authorization Type 11-18-19 - 12-17-19  -- 6 visits approved; requesting 6 more visits in December    Authorization - Visit Number 5    Authorization - Number of Visits 6    PT Start Time 0930    PT Stop Time 1015    PT Time Calculation (min) 45 min    Equipment Utilized During Treatment Gait belt    Activity Tolerance Patient tolerated treatment well    Behavior During Therapy [facility] for tasks assessed/performed           Past Medical History:  Diagnosis Date  . Asthma   . Brain tumor ([place])   . Diabetes mellitus without complication ([place])   . Eczema   . Food allergy   . Gastroparesis   . Hypokalemia   . Hypomagnesemia   . Hypothyroid   . Neuropathy   . Thyroid disease   . Vision changes     Past Surgical History:  Procedure Laterality Date  . BRAIN SURGERY    . Portacath placement and removed    . WISDOM TOOTH EXTRACTION      There were no vitals filed for this visit.   Subjective Assessment - 12/09/19 0936    Subjective Pt reports she [place] and reports difficulty with walking on uneven surfaces. Pt reports over-stimulation with all the people.    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  Going back to the gym and use the treadmill.    Currently in Pain? No/denies              [facility] PT Assessment - 12/09/19 1011      Assessment   Medical Diagnosis Disequilibrium, Traumatic injury of head, multiple falls, gait abnormality    Referring Provider (PT) Erin Sons, MD    Onset Date/Surgical Date 06/07/19    Hand Dominance Right    Prior Therapy yes - acute and inpatient rehab at [facility]      Precautions   Precautions Other (comment)    Precaution Comments asthma, eczema, food allergy, polyneuropathy, uncontrolled type 1 diabetes, gastroparesis, h/o chronic L thalamic infarct, new R thalamic infarct since 2016, lymphocytic thyroiditis, s/p radiation therapy, chemo and 2 surgical resection for medulloblastoma, childhood, nystagmus, visual field defect, sensorineural hearing loss of bilat ears      Prior Function   Level of Independence Independent    Vocation Student    Vocation Requirements Is in the [employer] program    Leisure going to the gym      Cognition   Overall Cognitive Status History of cognitive impairments - at baseline      Observation/Other Assessments  Focus on Therapeutic Outcomes (FOTO)  Not applicable - Medicaid      Transfers   Floor to Transfer 4: Min assist;3: Mod assist;With upper extremity assist;Multiple attempts;2: Max assist      Dynamic Gait Index   Level Surface Normal    Change in Gait Speed Mild Impairment    Gait with Horizontal Head Turns Mild Impairment    Gait with Vertical Head Turns Normal    Gait and Pivot Turn Moderate Impairment    Step Over Obstacle Normal    Step Around Obstacles Normal    Steps Normal    Total Score 20    DGI comment: 20/24               Vestibular Assessment - 12/09/19 0957      Balancemaster   Balancemaster Comment MCTSIB: 30  seconds on condition 1, 2.  Condition 3 able to hold for 5-6 seconds at a time.  Still unable to perform condition 4.      Positional Sensitivities   Sit to Supine No dizziness    Supine to Left Side Mild dizziness   L sidelying to Supine   Supine to Sitting No dizziness    Head Nodding x 5 No dizziness    Rolling Left Mild dizziness                    [facility] Adult PT Treatment/Exercise - 12/09/19 1011      Transfers   Transfers Floor to Transfer    Floor to Transfer 4: Min assist;3: Mod assist;With upper extremity assist;Multiple attempts    Floor to Transfer Details (indicate cue type and reason) continued to review how to rise from sitting down in bath tub.  Placed box beside pt 14" high and mat on other side to simulate sides of bathtub.  Attempted to push up through bilat UE and walk feet back to stand but pt still unable to shift hips backwards and lean forwards. Pt is able to get into a deep squat position with her feet underneath her but is unable to stand. Pt continues to shift hips forward and lean posteriorly. Reviewed sequencing x 3.                  PT Education - 12/09/19 1153    Education Details Pt educated on transfer out of tub training, POC, and progress towards LTGs.    Person(s) Educated Patient    Methods Explanation;Demonstration    Comprehension Verbalized understanding            PT Short Term Goals - 12/02/19 0908      PT SHORT TERM GOAL #1   Title = LTG             PT Long Term Goals - 12/09/19 1403      PT LONG TERM GOAL #1   Title Patient will demonstrate ability to perform final HEP independently, walk on treadmill at home with supervision and return to the gym for ongoing wellness    Baseline Pt reports indep with HEP but not yet going to gym or doing TM training    Time 3    Period Weeks    Status On-going    Target Date 12/30/19      PT LONG TERM GOAL #2   Title Pt will improve DGI without AD to >/= 22/24 points to  indicate decreased falls risk    Baseline 20/24 12/09/19    Time 3  Period Weeks    Status On-going    Target Date 12/30/19      PT LONG TERM GOAL #3   Title Pt will demonstrate ability to perform conditions 1/2 on MCTSIB for 30 seconds and perform condition 3/4 for up to 10 seconds to indicate improved sensory integration    Baseline 30 seconds for condition 1, 2; 5-6 seconds for condition 3, unable to perform condition 4 12/08/19    Time 3    Period Weeks    Status On-going    Target Date 12/30/19      PT LONG TERM GOAL #4   Title Pt will report no dizziness or headaches with bed mobility, transfers, head/body turns, bending down to the floor or looking up at the ceiling to decrease falls risk with ADL and mobility    Baseline mild dizziness with rolling back to supine from L sidelying    Time 3    Period Weeks    Status On-going    Target Date 12/30/19      PT LONG TERM GOAL #5   Title Pt will be able to sit down in tub and stand back up with supervision; pt will be able to perform shower in stand up shower with supervision only without LOB to increase independence with ADL    Baseline Requires min-mod A to transition from sitting > deep squat position > stand in shower    Time 3    Period Weeks    Status On-going    Target Date 12/30/19                 Plan - 12/09/19 1319    Clinical Impression Statement Today's skilled PT session focused on reassessment of LTGs. Pt scores 20/26 on DGI, still classifying patient as a moderate fall risk. Pt is able to hold conditions 1 and 2 on the modified-CTSIB for 30 seconds each with minimal postural sway. Pt with severe instrability with condition 3 (only able to hold ~5-6 seconds) and is still unable to attempt condition 4 at this time. Pt reported no dizziness with all positional testing except goin back to supine from rolling onto L side. Pt is still need mod A for final stand with floor to stand transfers to mimic getting out  of bathtub at home. Pt would benefit from continued skilled PT services to continue working toward unmet LTGs. PT to recert pt for 2x/week x 3 weeks in order to continue progressing towards LTGs.    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, childhood, nystagmus, visual field defect, sensorineural hearing loss of bilat ears    Rehab Potential Good    PT Frequency 2x / week    PT Duration 3 weeks    PT Treatment/Interventions ADLs/Self Care Home Management;Aquatic Therapy;Canalith Repostioning;DME Instruction;Gait training;Stair training;Functional mobility training;Patient/family education;Therapeutic activities;Orthotic Fit/Training;Therapeutic exercise;Balance training;Neuromuscular re-education;Vestibular    PT Next Visit Plan Continue to progress treadmill training; higher level balance and vestibular training, getting out of bathtub training - deep squat trainin    Consulted and Agree with Plan of Care Patient    Family Member Consulted --           Patient will benefit from skilled therapeutic intervention in order to improve the following deficits and impairments:  Abnormal gait, Decreased coordination, Difficulty walking, Dizziness, Decreased activity tolerance,  Decreased balance, Decreased strength, Impaired sensation, Pain  Visit Diagnosis: Muscle weakness (generalized)  Other abnormalities of gait and mobility  Unsteadiness on feet     Problem List Patient Active Problem List   Diagnosis Date Noted  . Impulsiveness 10/14/2019  . Mild episode of recurrent major depressive disorder ([place]) 09/04/2019  . Anxiety 08/16/2019  . 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  . 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 Status post radiation therapy 11/22/2018  .  Seasonal and perennial allergic rhinitis 11/22/2018  . Seasonal allergic conjunctivitis 11/22/2018  . Acute sinusitis 11/22/2018  . Keratosis pilaris 10/05/2017  . Melanocytic nevi of trunk 10/05/2017  . Adverse food reaction 02/09/2017  . Gastroesophageal reflux disease 02/09/2017  . Mild intermittent asthma with acute exacerbation 11/19/2015  . Mild persistent asthma without complication 60/73/7106  . Acute seasonal allergic rhinitis due to pollen 11/16/2015  . Anaphylactic shock due to adverse food reaction 11/16/2015  . 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 Type 1 diabetes mellitus without complication ([place]) 26/94/8546  . Dry mouth 11/16/2015  . Xerosis cutis 11/16/2015  . Nystagmus 08/15/2014  . Visual field defect 08/15/2014  . Disequilibrium 07/25/2014  . 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    Rosalita Levan, SPT 12/09/2019, 2:19 PM  [facility] [address] [place], [name], [address] Phone: [phone]   Fax:  [phone]  Name: KIERSTIN JANUARY MRN: [id number] Date of Birth: [birth year]

## 2019-12-14 ENCOUNTER — Other Ambulatory Visit: Payer: Self-pay | Admitting: Family Medicine

## 2019-12-16 ENCOUNTER — Ambulatory Visit: Payer: Medicaid Other | Admitting: Physical Therapy

## 2019-12-16 ENCOUNTER — Encounter: Payer: Self-pay | Admitting: Physical Therapy

## 2019-12-16 ENCOUNTER — Other Ambulatory Visit: Payer: Self-pay

## 2019-12-16 DIAGNOSIS — R2681 Unsteadiness on feet: Secondary | ICD-10-CM

## 2019-12-16 DIAGNOSIS — M6281 Muscle weakness (generalized): Secondary | ICD-10-CM

## 2019-12-16 DIAGNOSIS — R2689 Other abnormalities of gait and mobility: Secondary | ICD-10-CM | POA: Diagnosis not present

## 2019-12-16 NOTE — Therapy (Signed)
[facility] [address] [place], [name], [address] Phone: [phone]   Fax:  [phone]  Physical Therapy Treatment  Patient Details  Name: Tracey Perkins MRN: [id number] Date of Birth: March 01, 1985 Referring Provider (PT): Erin Sons, MD   Encounter Date: 12/16/2019   PT End of Session - 12/16/19 0956    Visit Number 21    Number of Visits 26    Date for PT Re-Evaluation 12/30/19    Authorization Type 11-18-19 - 12-17-19  -- 6 visits approved; requesting 6 more visits in December    Authorization - Visit Number 6    Authorization - Number of Visits 6    PT Start Time 0845    PT Stop Time 0940    PT Time Calculation (min) 55 min    Equipment Utilized During Treatment Gait belt    Activity Tolerance Patient tolerated treatment well    Behavior During Therapy [facility] for tasks assessed/performed           Past Medical History:  Diagnosis Date  . Asthma   . Brain tumor ([place])   . Diabetes mellitus without complication ([place])   . Eczema   . Food allergy   . Gastroparesis   . Hypokalemia   . Hypomagnesemia   . Hypothyroid   . Neuropathy   . Thyroid disease   . Vision changes     Past Surgical History:  Procedure Laterality Date  . BRAIN SURGERY    . Portacath placement and removed    . WISDOM TOOTH EXTRACTION      There were no vitals filed for this visit.   Subjective Assessment - 12/16/19 0849    Subjective Pt reports things are going well and she has been able to get out of the bathtub twice with her dad's supervision without having to get on her knees. "Huge success story"    Pertinent History asthma, eczema, food allergy, polyneuropathy, uncontrolled type 1 diabetes, gastroparesis, h/o chronic L thalamic infarct, new R thalamic infarct since 2016, lymphocytic thyroiditis, s/p radiation therapy, chemo and 2 surgical resection for medulloblastoma, childhood, nystagmus, visual field defect,  sensorineural hearing loss of bilat ears    Patient Stated Goals Getting back to walking independently and improve balance.  Going back to the gym and use the treadmill.    Currently in Pain? No/denies                             [facility] Adult PT Treatment/Exercise - 12/16/19 6195      Neuro Re-ed    Neuro Re-ed Details  Pt performed standing balance on upside-down bosu ball for 30 s x 2 reps with eyes open and no UE support, 1 trial with wide BOS and 1 trial with more narrow BOS. Pt required min A for stability with moderate postural sway. Pt then instructed in alternating forward step-downs from bosu-ball onto airex with cues to just tap toe while looking forward in mirror trying to get patient to rely more on proprioception instead of vision. Pt completed x10 reps bil with BUE support on bars and cues to not step all the way down but just tap toe onto airex as well as cues to stay as upright as possible and keep eyes forward. Pt then instructed in standing with eyes closed and narrow BOS on airex for 15 seconds x 2 trials without UE support and with mod-severe posutral sway requiring min-mod  A for stability,      Exercises   Exercises Other Exercises    Other Exercises  Deep squat training in // bars with bars lowered all the way down with 8" step with airex on top positioned for pt to lower herself down towards for deep squat training to mimic bathtub environment and practice anterior weight shifting with STS from bathub transitions. Pt with CGA x6 reps with BUE support on bars and x2 reps with only LUE support and min A. Pt with cues for foot and hand placement as well as proper anterior weight shift.      Knee/Hip Exercises: Aerobic   Tread Mill 1.5 mph x 5 minutes with cues on narrowing BOS and keeping toes forward as well as really picking up feet and taking a bigger step with RLE. Pt reports feeling a little winded and short of breath so PT terminated treadmill training early  at 5 min mark..                  PT Education - 12/16/19 0955    Education Details PTeducated pt and mother on proper grab bar placement in tub shower for safer bathing and transitioning in/out of tub. PT recommendation is to do 1 horizontal grab bar under the inset in shower and one vertical bar placed where pt would naturally reach for when trying to stand out of bathub.    Person(s) Educated Patient;Parent(s)    Methods Explanation;Demonstration;Verbal cues;Handout    Comprehension Verbalized understanding            PT Short Term Goals - 12/02/19 0908      PT SHORT TERM GOAL #1   Title = LTG             PT Long Term Goals - 12/09/19 1403      PT LONG TERM GOAL #1   Title Patient will demonstrate ability to perform final HEP independently, walk on treadmill at home with supervision and return to the gym for ongoing wellness    Baseline Pt reports indep with HEP but not yet going to gym or doing TM training    Time 3    Period Weeks    Status On-going    Target Date 12/30/19      PT LONG TERM GOAL #2   Title Pt will improve DGI without AD to >/= 22/24 points to indicate decreased falls risk    Baseline 20/24 12/09/19    Time 3    Period Weeks    Status On-going    Target Date 12/30/19      PT LONG TERM GOAL #3   Title Pt will demonstrate ability to perform conditions 1/2 on MCTSIB for 30 seconds and perform condition 3/4 for up to 10 seconds to indicate improved sensory integration    Baseline 30 seconds for condition 1, 2; 5-6 seconds for condition 3, unable to perform condition 4 12/08/19    Time 3    Period Weeks    Status On-going    Target Date 12/30/19      PT LONG TERM GOAL #4   Title Pt will report no dizziness or headaches with bed mobility, transfers, head/body turns, bending down to the floor or looking up at the ceiling to decrease falls risk with ADL and mobility    Baseline mild dizziness with rolling back to supine from L sidelying    Time  3    Period Weeks    Status On-going  Target Date 12/30/19      PT LONG TERM GOAL #5   Title Pt will be able to sit down in tub and stand back up with supervision; pt will be able to perform shower in stand up shower with supervision only without LOB to increase independence with ADL    Baseline Requires min-mod A to transition from sitting > deep squat position > stand in shower    Time 3    Period Weeks    Status On-going    Target Date 12/30/19                 Plan - 12/16/19 1000    Clinical Impression Statement Today's skilled PT session focused on treadmill training, high-level balance training on compliant surfaces and with vision removed, as well as deep-squat training in // bars to promote ease with bathtub transfers. Pt is most challenged with dynamic balance activities with vision removed as well as more compliant surfaces such as bosu ball. Pt with weak ankle musculature and poor ankle stability. Pt works better with less frequent verbal cueing due to slower mental processing s/p brain injury. PT to continue to work towards [place].    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, childhood, nystagmus, visual field defect, sensorineural hearing loss of bilat ears    Rehab Potential Good    PT Frequency 2x / week    PT Duration 3 weeks    PT Treatment/Interventions ADLs/Self Care Home Management;Aquatic Therapy;Canalith Repostioning;DME Instruction;Gait training;Stair training;Functional mobility training;Patient/family education;Therapeutic activities;Orthotic Fit/Training;Therapeutic exercise;Balance training;Neuromuscular re-education;Vestibular    PT Next Visit Plan Did they install grab bars.  Continue to progress treadmill training; higher level  balance and vestibular training, getting out of bathtub training - deep squat training. Awaiting approval for more visits for the rest of the year.    Consulted and Agree with Plan of Care Patient;Family member/caregiver    Family Member Consulted Mother           Patient will benefit from skilled therapeutic intervention in order to improve the following deficits and impairments:  Abnormal gait, Decreased coordination, Difficulty walking, Dizziness, Decreased activity tolerance, Decreased balance, Decreased strength, Impaired sensation, Pain  Visit Diagnosis: Muscle weakness (generalized)  Other abnormalities of gait and mobility  Unsteadiness on feet     Problem List Patient Active Problem List   Diagnosis Date Noted  . Impulsiveness 10/14/2019  . Mild episode of recurrent major depressive disorder ([place]) 09/04/2019  . Anxiety 08/16/2019  . 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  . 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 Status post radiation therapy 11/22/2018  . Seasonal and perennial allergic rhinitis 11/22/2018  . Seasonal allergic conjunctivitis 11/22/2018  . Acute sinusitis 11/22/2018  . Keratosis pilaris 10/05/2017  . Melanocytic nevi of trunk 10/05/2017  . Adverse food reaction 02/09/2017  . Gastroesophageal reflux disease 02/09/2017  . Mild intermittent asthma with acute exacerbation 11/19/2015  . Mild persistent asthma without complication 16/10/9602  . Acute seasonal allergic rhinitis due to pollen 11/16/2015  . Anaphylactic shock due to adverse food reaction 11/16/2015  . 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 Type 1 diabetes mellitus without complication ([place]) 54/09/8117  . Dry mouth 11/16/2015  . Xerosis cutis 11/16/2015  . Nystagmus 08/15/2014  . Visual field defect 08/15/2014  . Disequilibrium 07/25/2014  . 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  Rosalita Levan, SPT 12/16/2019, 10:04 AM  [facility] [address] [place], [name], [address] Phone: [phone]   Fax:  [phone]  Name: ELIANY MCCARTER MRN: [id number] Date of Birth: [birth year]

## 2019-12-19 ENCOUNTER — Ambulatory Visit: Payer: Medicaid Other | Admitting: Physical Therapy

## 2019-12-23 ENCOUNTER — Other Ambulatory Visit: Payer: Self-pay

## 2019-12-23 ENCOUNTER — Ambulatory Visit: Payer: Medicaid Other | Attending: Neurology | Admitting: Physical Therapy

## 2019-12-23 DIAGNOSIS — R42 Dizziness and giddiness: Secondary | ICD-10-CM | POA: Insufficient documentation

## 2019-12-23 DIAGNOSIS — R2689 Other abnormalities of gait and mobility: Secondary | ICD-10-CM | POA: Insufficient documentation

## 2019-12-23 DIAGNOSIS — M6281 Muscle weakness (generalized): Secondary | ICD-10-CM | POA: Diagnosis not present

## 2019-12-23 DIAGNOSIS — R209 Unspecified disturbances of skin sensation: Secondary | ICD-10-CM | POA: Insufficient documentation

## 2019-12-23 DIAGNOSIS — R2681 Unsteadiness on feet: Secondary | ICD-10-CM | POA: Diagnosis present

## 2019-12-23 DIAGNOSIS — R296 Repeated falls: Secondary | ICD-10-CM | POA: Diagnosis present

## 2019-12-24 ENCOUNTER — Encounter: Payer: Self-pay | Admitting: Physical Therapy

## 2019-12-24 ENCOUNTER — Other Ambulatory Visit: Payer: Self-pay | Admitting: Family Medicine

## 2019-12-24 NOTE — Therapy (Signed)
[facility] [address] [place], [name], [address] Phone: [phone]   Fax:  [phone]  Physical Therapy Treatment  Patient Details  Name: Tracey Perkins MRN: [id number] Date of Birth: 01-08-1986 Referring Provider (PT): Erin Sons, MD   Encounter Date: 12/23/2019   PT End of Session - 12/24/19 1251    Visit Number 22    Number of Visits 26    Date for PT Re-Evaluation 12/30/19    Authorization Type 11-18-19 - 12-17-19  -- 6 visits approved; requesting 6 more visits in December    Authorization - Visit Number 6    Authorization - Number of Visits 6    PT Start Time [phone]    PT Stop Time 0932    PT Time Calculation (min) 42 min    Equipment Utilized During Treatment Gait belt    Activity Tolerance Patient tolerated treatment well    Behavior During Therapy [facility] for tasks assessed/performed           Past Medical History:  Diagnosis Date  . Asthma   . Brain tumor ([place])   . Diabetes mellitus without complication ([place])   . Eczema   . Food allergy   . Gastroparesis   . Hypokalemia   . Hypomagnesemia   . Hypothyroid   . Neuropathy   . Thyroid disease   . Vision changes     Past Surgical History:  Procedure Laterality Date  . BRAIN SURGERY    . Portacath placement and removed    . WISDOM TOOTH EXTRACTION      There were no vitals filed for this visit.   Subjective Assessment - 12/23/19 0854    Subjective Mother states they have not had grab bars installed yet but they plan to do so:  Pt reports she has allergies today - unable to wear mask over nose or else "I will have a panic attack"- pt requests to move to a room for treatment so that she can lower mask below nose    Patient is accompained by: Family member   Mother, Tracey Perkins   Pertinent History asthma, eczema, food allergy, polyneuropathy, uncontrolled type 1 diabetes, gastroparesis, h/o chronic L thalamic infarct, new R thalamic infarct  since 2016, lymphocytic thyroiditis, s/p radiation therapy, chemo and 2 surgical resection for medulloblastoma, childhood, nystagmus, visual field defect, sensorineural hearing loss of bilat ears    Patient Stated Goals Getting back to walking independently and improve balance.  Going back to the gym and use the treadmill.    Currently in Pain? No/denies                      NeuroRe-ed:  Tall kneeling on mat on floor - EO with horizontal and vertical head turns 5 reps each with CGA EC - holding 10 secs without head turns  Squats back toward heels for hip extensor strengthening - 10 reps - cues to squeeze gluts with return to upright  Pt performed 1/2 kneeling - RLE - was performed first; pt able to hold position with SBA without UE support; pt Performed slow horizontal and vertical head turns 5 reps each with CGA to min assist for recovery of LOB  LLE 1/2 kneeling - pt needed RUE support due to c/o fatigue; pt performed 5 reps horizontal and vertical head turns With RUE support on mat       [facility] Adult PT Treatment/Exercise - 12/24/19 0001      Transfers  Transfers Sit to Stand;Stand to Sit    Sit to Stand 4: Min guard    Sit to Stand Details (indicate cue type and reason) no UE support used     Floor to Transfer 5: Supervision    Floor to Transfer Details (indicate cue type and reason) simulated tub transfer - long sitting on floor ( on blue mat); simulated wall on pt's R side with pt exiting tub on her left side;  pt independently turned toward her right side and lifted hips to initiate transfer for getting out of tub; pt able to flex bil. LE's and pull on mat with LUE with CGA for simulation of  tub transfer     Comments feet on Airex - no UE support                Balance Exercises - 12/24/19 0001      Balance Exercises: Standing   Standing Eyes Opened Wide (BOA);Head turns;Foam/compliant surface;5 reps   horizontal and vertical head turns   Standing Eyes  Closed Wide (BOA);Head turns;Foam/compliant surface;5 reps   horizontal and vertical head turns              PT Short Term Goals - 12/02/19 0908      PT SHORT TERM GOAL #1   Title = LTG             PT Long Term Goals - 12/24/19 1300      PT LONG TERM GOAL #1   Title Patient will demonstrate ability to perform final HEP independently, walk on treadmill at home with supervision and return to the gym for ongoing wellness    Baseline Pt reports indep with HEP but not yet going to gym or doing TM training    Time 3    Period Weeks    Status On-going      PT LONG TERM GOAL #2   Title Pt will improve DGI without AD to >/= 22/24 points to indicate decreased falls risk    Baseline 20/24 12/09/19    Time 3    Period Weeks    Status On-going      PT LONG TERM GOAL #3   Title Pt will demonstrate ability to perform conditions 1/2 on MCTSIB for 30 seconds and perform condition 3/4 for up to 10 seconds to indicate improved sensory integration    Baseline 30 seconds for condition 1, 2; 5-6 seconds for condition 3, unable to perform condition 4 12/08/19    Time 3    Period Weeks    Status On-going      PT LONG TERM GOAL #4   Title Pt will report no dizziness or headaches with bed mobility, transfers, head/body turns, bending down to the floor or looking up at the ceiling to decrease falls risk with ADL and mobility    Baseline mild dizziness with rolling back to supine from L sidelying    Time 3    Period Weeks    Status On-going      PT LONG TERM GOAL #5   Title Pt will be able to sit down in tub and stand back up with supervision; pt will be able to perform shower in stand up shower with supervision only without LOB to increase independence with ADL    Baseline Requires min-mod A to transition from sitting > deep squat position > stand in shower    Time 3    Period Weeks    Status On-going  Plan - 12/23/19 0856    Clinical Impression Statement Session  focused on simulated tub transfer and on compliant surface training with head turns for increased vestibular input in maintaining balance.  Pt did very well with transferring from floor (from long-sitting position) to stand in simulation of tub transfer with LUE support on mat table to simulate grab bar which is planned to be installed.  Pt had some unsteadiness with Rt and Lt 1/2 kneeling positions which progressively increased with fatigue, indicative of decreased core stabilization.  Pt required RUE on mat for assist with balance with Lt 1/2 kneeling, as this activity was performed near end of 45" treatment session.  Cont with POC.    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, childhood, nystagmus, visual field defect, sensorineural hearing loss of bilat ears    Rehab Potential Good    PT Frequency 2x / week    PT Duration 3 weeks    PT Treatment/Interventions ADLs/Self Care Home Management;Aquatic Therapy;Canalith Repostioning;DME Instruction;Gait training;Stair training;Functional mobility training;Patient/family education;Therapeutic activities;Orthotic Fit/Training;Therapeutic exercise;Balance training;Neuromuscular re-education;Vestibular    PT Next Visit Plan Did they install grab bars?  Continue to progress treadmill training; higher level balance and vestibular training, getting out of bathtub training - deep squat training. Awaiting approval for more visits for the rest of the year.    Consulted and Agree with Plan of Care Patient;Family member/caregiver    Family Member Consulted Mother           Patient will benefit from skilled therapeutic intervention in order to improve the following deficits and impairments:  Abnormal gait, Decreased coordination, Difficulty  walking, Dizziness, Decreased activity tolerance, Decreased balance, Decreased strength, Impaired sensation, Pain  Visit Diagnosis: Muscle weakness (generalized)  Unsteadiness on feet  Other abnormalities of gait and mobility     Problem List Patient Active Problem List   Diagnosis Date Noted  . Impulsiveness 10/14/2019  . Mild episode of recurrent major depressive disorder ([place]) 09/04/2019  . Anxiety 08/16/2019  . 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  . 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 Status post radiation therapy 11/22/2018  . Seasonal and perennial allergic rhinitis 11/22/2018  . Seasonal allergic conjunctivitis 11/22/2018  . Acute sinusitis 11/22/2018  . Keratosis pilaris 10/05/2017  . Melanocytic nevi of trunk 10/05/2017  . Adverse food reaction 02/09/2017  . Gastroesophageal reflux disease 02/09/2017  . Mild intermittent asthma with acute exacerbation 11/19/2015  . Mild persistent asthma without complication 48/54/6270  . Acute seasonal allergic rhinitis due to pollen 11/16/2015  . Anaphylactic shock due to adverse food reaction 11/16/2015  . 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 Type 1 diabetes mellitus without complication ([place]) 35/00/9381  . Dry mouth 11/16/2015  . Xerosis cutis 11/16/2015  . Nystagmus 08/15/2014  . Visual field defect 08/15/2014  . Disequilibrium 07/25/2014  . 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    Jola Critzer, [place], PT 12/24/2019, 1:01 PM  [facility] [address] [place], [name], [address] Phone: [phone]   Fax:  [phone]  Name: Tracey Perkins MRN: [id number] Date of Birth: 10/23/1985

## 2019-12-25 ENCOUNTER — Encounter: Payer: Self-pay | Admitting: Family Medicine

## 2019-12-25 ENCOUNTER — Other Ambulatory Visit: Payer: Self-pay

## 2019-12-25 ENCOUNTER — Ambulatory Visit (INDEPENDENT_AMBULATORY_CARE_PROVIDER_SITE_OTHER): Payer: Medicaid Other | Admitting: Family Medicine

## 2019-12-25 DIAGNOSIS — H1010 Acute atopic conjunctivitis, unspecified eye: Secondary | ICD-10-CM | POA: Diagnosis not present

## 2019-12-25 DIAGNOSIS — J3089 Other allergic rhinitis: Secondary | ICD-10-CM | POA: Diagnosis not present

## 2019-12-25 DIAGNOSIS — T781XXS Other adverse food reactions, not elsewhere classified, sequela: Secondary | ICD-10-CM

## 2019-12-25 DIAGNOSIS — J302 Other seasonal allergic rhinitis: Secondary | ICD-10-CM

## 2019-12-25 DIAGNOSIS — K219 Gastro-esophageal reflux disease without esophagitis: Secondary | ICD-10-CM

## 2019-12-25 DIAGNOSIS — E109 Type 1 diabetes mellitus without complications: Secondary | ICD-10-CM

## 2019-12-25 DIAGNOSIS — J4530 Mild persistent asthma, uncomplicated: Secondary | ICD-10-CM | POA: Diagnosis not present

## 2019-12-25 DIAGNOSIS — T781XXD Other adverse food reactions, not elsewhere classified, subsequent encounter: Secondary | ICD-10-CM

## 2019-12-25 MED ORDER — FLUTICASONE PROPIONATE 50 MCG/ACT NA SUSP: NASAL | 5 refills | Status: DC

## 2019-12-25 MED ORDER — FLOVENT HFA 110 MCG/ACT IN AERO: 2.0000 | INHALATION_SPRAY | Freq: Every day | RESPIRATORY_TRACT | 5 refills | Status: DC

## 2019-12-25 MED ORDER — AZELASTINE HCL 0.1 % NA SOLN: 2.0000 | Freq: Two times a day (BID) | NASAL | 5 refills | Status: DC

## 2019-12-25 MED ORDER — CETIRIZINE HCL 10 MG PO TABS: 10.0000 mg | ORAL_TABLET | Freq: Every day | ORAL | 5 refills | Status: DC

## 2019-12-25 MED ORDER — MONTELUKAST SODIUM 10 MG PO TABS: 10.0000 mg | ORAL_TABLET | Freq: Every day | ORAL | 5 refills | Status: DC

## 2019-12-25 MED ORDER — PANTOPRAZOLE SODIUM 40 MG PO TBEC: 40.0000 mg | DELAYED_RELEASE_TABLET | Freq: Every day | ORAL | 5 refills | Status: DC | PRN

## 2019-12-25 NOTE — Progress Notes (Addendum)
RE: Tracey Perkins MRN: [id number] DOB: [birth year] Date of Telemedicine Visit: 12/25/2019  Referring provider: Prince Solian, MD Primary care provider: Prince Solian, MD  Chief Complaint: Allergic Rhinitis    Telemedicine Follow Up Visit via Telephone: I connected with Tracey Perkins for a follow up on 12/25/19 by telephone and verified that I am speaking with the correct person using two identifiers.   I discussed the limitations, risks, security and privacy concerns of performing an evaluation and management service by telephone and the availability of in person appointments. I also discussed with the patient that there may be a patient responsible charge related to this service. The patient expressed understanding and agreed to proceed.  Patient is at home accompanied by her mother who provided/contributed to the history.  Provider is at the office.  Visit start time: 5366 Visit end time: [place] consent/check in by: [facility] consent and medical assistant/nurse: Damita  History of Present Illness: She is a 34 y.o. female, who is being followed for asthma, allergic rhinitis, allergic conjunctivitis, reflux, and food allergy. Her previous allergy office visit was on 11/22/2018 with Gareth Morgan, [place].  Her medical history includes diabetes type 1, anxiety, medulloblastoma, and most recently gait instability with post concussion syndrome. . At today's visit, she is accompanied by her mother via speaker phone.  She reports her asthma has been moderately well controlled with occasional dyspnea which is worse when she is experiencing nasal congestion.  She denies wheeze and cough with activity and rest.  She continues montelukast 10 mg once a day and uses albuterol about 1-2 times a week with moderate relief of symptoms.  She does report that she has used her Flovent inhaler about once every 2 to 3 weeks with little relief of asthma symptoms.  Allergic rhinitis is reported as poorly  controlled with symptoms including frequent nasal congestion, sneezing, and clear rhinorrhea.  She continues cetirizine 10 mg once a day on days, and azelastine.  She does report poor Flonase application technique.  Allergic conjunctivitis is reported as moderately well controlled with occasional itchy eyes for which she continues a lubricating drop.  Reflux is reported as moderately well controlled is aggravated by certain foods as well as coffee.  She is not currently taking omeprazole.  She continues to avoid banana with no accidental ingestion or EpiPen use since her last visit to this clinic.  Her current medications are listed in the chart.  Assessment and Plan: Tracey Perkins is a 34 y.o. female with: Patient Instructions  Allergic rhinitis Continue Zyrtec 10 mg once a day if needed for runny nose or itchy eyes.  Continue fluticasone 1 spray per nostril twice a day if needed for stuffy nose. This may help your ears.  In the right nostril, point the applicator out toward the right ear. In the left nostril, point the applicator out toward the left ear Astelin nasal spray 2 sprays in each nostril twice a day as needed for runny nose or sneezing Consider saline nasal rinses as needed for nasal symptoms. Use this before any medicated nasal sprays for best result Continue environmental control of pollens dust mite and mold  Consider allergen immunotherapy if medications are not effective in controlling your symptoms  Allergic conjunctivitis Continue a daily lubricating eye drop such as Natural Tears [place] eye drop one drop in each eye once a day as needed for itchy red eyes. Some over the counter eye drops include Pataday one drop in each eye once a day  as needed for red, itchy eyes OR Zaditor one drop in each eye twice a day as needed for red itchy eyes.  Asthma Continue montelukast  10 mg once a day for coughing or wheezing Continue albuterol (ProAir, red inhaler) 2 puffs every 4 hours if needed for  wheezing or coughing spells For asthma flares, begin Flovent 110 (orange inhaler)-2 puffs once a day with a spacer for 1-2 weeks or until you are cough and wheeze free  Reflux Continue omeprazole 20 mg twice a day to control reflux Follow up with your Gastroenterologist as you normally do  Adverse food reaction Continue to avoid banana. In case of an allergic reaction, take Benadryl 50 mg every 4 hours, and if life-threatening symptoms occur, inject with EpiPen 0.3 mg.  Continue other medications as noted in your chart.  Call me if this treatment plan is not working well for you  Follow up in the clinic in 6 months or sooner as needed.   Return in about 6 months (around 06/24/2020), or if symptoms worsen or fail to improve.  Meds ordered this encounter  Medications  . montelukast (SINGULAIR) 10 MG tablet    Sig: Take 1 tablet (10 mg total) by mouth at bedtime. To prevent coughing or wheezing    Dispense:  34 tablet    Refill:  5  . DISCONTD: pantoprazole (PROTONIX) 40 MG tablet    Sig: Take 1 tablet (40 mg total) by mouth daily as needed (acid reflux/heartburn).    Dispense:  30 tablet    Refill:  5  . fluticasone (FLOVENT HFA) 110 MCG/ACT inhaler    Sig: Inhale 2 puffs into the lungs daily. Rinse, gargle and spit out after use.    Dispense:  1 each    Refill:  5  . fluticasone (FLONASE) 50 MCG/ACT nasal spray    Sig: 2 sprays per nostril daily as needed for stuffy nose.    Dispense:  16 g    Refill:  5  . cetirizine (ZYRTEC) 10 MG tablet    Sig: Take 1 tablet (10 mg total) by mouth daily.    Dispense:  34 tablet    Refill:  5    Please keep rx on file. Pt. Will call when needed.  Marland Kitchen azelastine (ASTELIN) 0.1 % nasal spray    Sig: Place 2 sprays into both nostrils 2 (two) times daily.    Dispense:  30 mL    Refill:  5    Please keep rx on file. Pt. Will call when needed.    Medication List:  Current Outpatient Medications  Medication Sig Dispense Refill  . acetaminophen  (TYLENOL) 500 MG tablet Take 500 mg by mouth every 6 (six) hours as needed for moderate pain.     Marland Kitchen aspirin EC 81 MG tablet Take 81 mg by mouth daily. Swallow whole.    Marland Kitchen atorvastatin (LIPITOR) 40 MG tablet Take 40 mg by mouth at bedtime.    Marland Kitchen azelastine (ASTELIN) 0.1 % nasal spray Place 2 sprays into both nostrils 2 (two) times daily. 30 mL 5  . cetirizine (ZYRTEC) 10 MG tablet Take 1 tablet (10 mg total) by mouth daily. 34 tablet 5  . citalopram (CELEXA) 10 MG tablet Take 0.5 tablets (5 mg total) by mouth daily. Patient will cut pill in half. She has a traumatic brain injury and will take half the dose. 30 tablet 2  . Continuous Blood Gluc Sensor (FREESTYLE LIBRE SENSOR SYSTEM) MISC USE 1 DEVICE EVERY  10 (TEN) DAYS.  11  . Continuous Blood Gluc Transmit (DEXCOM G6 TRANSMITTER) MISC USE 1 EACH EVERY 3 (THREE) MONTHS    . cyanocobalamin 1000 MCG tablet Take by mouth daily.    Marland Kitchen EPINEPHrine 0.3 mg/0.3 mL IJ SOAJ injection Use as directed for severe allergic reaction. (Patient taking differently: Inject 0.3 mg into the muscle as needed for anaphylaxis. Use as directed for severe allergic reaction.) 2 Device 1  . fluticasone (FLONASE) 50 MCG/ACT nasal spray 2 sprays per nostril daily as needed for stuffy nose. 16 g 5  . gabapentin (NEURONTIN) 300 MG capsule Take 300 mg by mouth at bedtime.   3  . Glucagon 3 MG/DOSE POWD as needed.    Marland Kitchen glucose blood test strip Use 3 (three) times daily Use as instructed.    . insulin aspart (NOVOLOG) 100 UNIT/ML injection USE UP TO 130 UNITS DAILY, AS DIRECTED IN INSULIN PUMP.    . Insulin Human (INSULIN PUMP) SOLN Inject 80 each into the skin daily. Novolog Insulin Pump    . KLOR-CON M10 10 MEQ tablet Take 10 mEq by mouth daily.    Marland Kitchen levothyroxine (SYNTHROID, LEVOTHROID) 88 MCG tablet Take 100 mcg by mouth daily before breakfast.     . lubiprostone (AMITIZA) 8 MCG capsule Take 16 mcg by mouth daily with breakfast.     . magnesium oxide (MAG-OX) 400 MG tablet Take  800 mg by mouth at bedtime.    . montelukast (SINGULAIR) 10 MG tablet Take 1 tablet (10 mg total) by mouth at bedtime. To prevent coughing or wheezing 34 tablet 5  . norgestimate-ethinyl estradiol (ORTHO-CYCLEN) 0.25-35 MG-MCG tablet Take 1 tablet by mouth daily.     Marland Kitchen PROAIR HFA 108 (90 Base) MCG/ACT inhaler TAKE 2 PUFFS BY MOUTH EVERY 4 HOURS AS NEEDED (Patient taking differently: Inhale 2 puffs into the lungs every 6 (six) hours as needed for wheezing or shortness of breath. ) 6.7 g 0  . ARIPiprazole (ABILIFY) 2 MG tablet Take 1 tablet (2 mg total) by mouth daily. (Patient not taking: Reported on 12/25/2019) 30 tablet 2  . fluticasone (FLOVENT HFA) 110 MCG/ACT inhaler Inhale 2 puffs into the lungs daily. Rinse, gargle and spit out after use. 1 each 5  . glucagon (GLUCAGON EMERGENCY) 1 MG injection Inject 1 mg into the skin once as needed (for diabeties).  (Patient not taking: Reported on 12/25/2019)    . insulin glargine (LANTUS) 100 UNIT/ML injection Inject 16 Units into the skin daily.  (Patient not taking: Reported on 12/25/2019)    . meclizine (ANTIVERT) 25 MG tablet Take 25 mg by mouth 2 (two) times daily as needed for dizziness.  (Patient not taking: Reported on 12/25/2019)     No current facility-administered medications for this visit.   Allergies: Allergies  Allergen Reactions  . Banana Other (See Comments)    Mouth burning and swollen  . Bactrim [Sulfamethoxazole-Trimethoprim] Hives  . Doxycycline Hives  . Gentian Violet Other (See Comments)    "It burns."  Per mother  . Gentian Violet-Proflavine Sulfate [Triple Dye] Other (See Comments)    Mouth burn  . Mold Extract [Trichophyton] Other (See Comments)    Hay Fever  . Morphine And Related Hives  . Other Other (See Comments) and Hives    "Hay fever, dust and pollen."  Per patient.  . Vancomycin Other (See Comments)    Red man syndrome    I reviewed her past medical history, social history, family history, and environmental  history and no significant changes have been reported from previous visit on 11/22/2018.  Objective: Physical Exam Not obtained as encounter was done via telephone.   Previous notes and tests were reviewed.  I discussed the assessment and treatment plan with the patient. The patient was provided an opportunity to ask questions and all were answered. The patient agreed with the plan and demonstrated an understanding of the instructions.   The patient was advised to call back or seek an in-person evaluation if the symptoms worsen or if the condition fails to improve as anticipated.  I provided 33 minutes of non-face-to-face time during this encounter.  It was my pleasure to participate in [place] care today. Please feel free to contact me with any questions or concerns.   Sincerely,  Gareth Morgan, FNP  ________________________________________________  I have provided oversight concerning [facility]'s evaluation and treatment of this patient's health issues addressed during today's encounter.  I agree with the assessment and therapeutic plan as outlined in the note.   Signed,   R Edgar Frisk, MD

## 2019-12-25 NOTE — Patient Instructions (Addendum)
Allergic rhinitis Continue Zyrtec 10 mg once a day if needed for runny nose or itchy eyes.  Continue fluticasone 1 spray per nostril twice a day if needed for stuffy nose. This may help your ears.  In the right nostril, point the applicator out toward the right ear. In the left nostril, point the applicator out toward the left ear Astelin nasal spray 2 sprays in each nostril twice a day as needed for runny nose or sneezing Consider saline nasal rinses as needed for nasal symptoms. Use this before any medicated nasal sprays for best result Continue environmental control of pollens dust mite and mold  Consider allergen immunotherapy if medications are not effective in controlling your symptoms  Allergic conjunctivitis Continue a daily lubricating eye drop such as Natural Tears [place] eye drop one drop in each eye once a day as needed for itchy red eyes. Some over the counter eye drops include Pataday one drop in each eye once a day as needed for red, itchy eyes OR Zaditor one drop in each eye twice a day as needed for red itchy eyes.  Asthma Continue montelukast  10 mg once a day for coughing or wheezing Continue albuterol (ProAir, red inhaler) 2 puffs every 4 hours if needed for wheezing or coughing spells For asthma flares, begin Flovent 110 (orange inhaler)-2 puffs once a day with a spacer for 1-2 weeks or until you are cough and wheeze free  Reflux Continue omeprazole 20 mg twice a day to control reflux Follow up with your Gastroenterologist as you normally do  Adverse food reaction Continue to avoid banana. In case of an allergic reaction, take Benadryl 50 mg every 4 hours, and if life-threatening symptoms occur, inject with EpiPen 0.3 mg.  Continue other medications as noted in your chart.  Call me if this treatment plan is not working well for you  Follow up in the clinic in 6 months or sooner as needed.

## 2019-12-26 ENCOUNTER — Ambulatory Visit: Payer: Medicaid Other

## 2019-12-26 DIAGNOSIS — R2689 Other abnormalities of gait and mobility: Secondary | ICD-10-CM

## 2019-12-26 DIAGNOSIS — M6281 Muscle weakness (generalized): Secondary | ICD-10-CM

## 2019-12-26 DIAGNOSIS — R2681 Unsteadiness on feet: Secondary | ICD-10-CM

## 2019-12-26 NOTE — Therapy (Signed)
[facility] [address] [place], [name], [address] Phone: [phone]   Fax:  [phone]  Physical Therapy Treatment  Patient Details  Name: Tracey Perkins MRN: [id number] Date of Birth: Oct 21, 1985 Referring Provider (PT): Erin Sons, MD   Encounter Date: 12/26/2019   PT End of Session - 12/26/19 0905    Visit Number 23    Number of Visits 26    Date for PT Re-Evaluation 12/30/19    Authorization Type 11-18-19 - 12-17-19  -- 6 visits approved; requesting 6 more visits in December    Authorization - Visit Number 6    Authorization - Number of Visits 6    PT Start Time 0900   PT running behind   PT Stop Time 0935    PT Time Calculation (min) 35 min    Equipment Utilized During Treatment Gait belt    Activity Tolerance Patient tolerated treatment well    Behavior During Therapy [facility] for tasks assessed/performed           Past Medical History:  Diagnosis Date  . Asthma   . Brain tumor ([place])   . Diabetes mellitus without complication ([place])   . Eczema   . Food allergy   . Gastroparesis   . Hypokalemia   . Hypomagnesemia   . Hypothyroid   . Neuropathy   . Post concussion syndrome 05/2019  . Thyroid disease   . Vision changes     Past Surgical History:  Procedure Laterality Date  . BRAIN SURGERY    . Portacath placement and removed    . WISDOM TOOTH EXTRACTION      There were no vitals filed for this visit.   Subjective Assessment - 12/26/19 0913    Subjective Pt's mother reports that she will be gettting grab bars installed very soon in next week to 10 days. Asking therapist for suggestions on placement of 2 bars.    Patient is accompained by: Family member   Mother, [name]   Pertinent History asthma, eczema, food allergy, polyneuropathy, uncontrolled type 1 diabetes, gastroparesis, h/o chronic L thalamic infarct, new R thalamic infarct since 2016, lymphocytic thyroiditis, s/p radiation  therapy, chemo and 2 surgical resection for medulloblastoma, childhood, nystagmus, visual field defect, sensorineural hearing loss of bilat ears    Patient Stated Goals Getting back to walking independently and improve balance.  Going back to the gym and use the treadmill.                             [facility] Adult PT Treatment/Exercise - 12/26/19 0914      Transfers   Transfers Floor to Transfer;Sit to Stand;Stand to Sit    Sit to Stand 5: Supervision;4: Min guard    Floor to Transfer 4: Min assist;3: Mod assist    Floor to Transfer Details (indicate cue type and reason) Performed on floor next to low mat with large chair on right side to facilitate both side of tub and grab bar. Pt performed from long sit position pushing down on the 2 surfaces with cues to lean forward to be able to bring feet back under her more. Pt needed mod assist to rise once bottom off floor and then min assist once feet under her to rise the rest of the way from behind. Tends to lean back. PT modified position starting on 8" step to rise from. Min assist from here with cues again to lean  forward more. Performed x 3 reps. PT discussed height of horizontal grab bar should be just low enough that pt can reach when sitting but also allow her support when she is almost to standing as well to provide leverage not being too low. They plan to install one horizontal bar and one vertical bar in front of it.    Comments Worked on simulated tub transfers and discussed grab bar positioning      Neuro Re-ed    Neuro Re-ed Details  Standing on airex without UE support trying to maintain balance x 30 sec CGA, standing on airex with marching in place with UE support. Marching in // bars with 1 UE support. Pt unsteady on compliant surface and reports takes brain awhile to get used to solid surface once off it.                    PT Short Term Goals - 12/02/19 0908      PT SHORT TERM GOAL #1   Title = LTG              PT Long Term Goals - 12/24/19 1300      PT LONG TERM GOAL #1   Title Patient will demonstrate ability to perform final HEP independently, walk on treadmill at home with supervision and return to the gym for ongoing wellness    Baseline Pt reports indep with HEP but not yet going to gym or doing TM training    Time 3    Period Weeks    Status On-going      PT LONG TERM GOAL #2   Title Pt will improve DGI without AD to >/= 22/24 points to indicate decreased falls risk    Baseline 20/24 12/09/19    Time 3    Period Weeks    Status On-going      PT LONG TERM GOAL #3   Title Pt will demonstrate ability to perform conditions 1/2 on MCTSIB for 30 seconds and perform condition 3/4 for up to 10 seconds to indicate improved sensory integration    Baseline 30 seconds for condition 1, 2; 5-6 seconds for condition 3, unable to perform condition 4 12/08/19    Time 3    Period Weeks    Status On-going      PT LONG TERM GOAL #4   Title Pt will report no dizziness or headaches with bed mobility, transfers, head/body turns, bending down to the floor or looking up at the ceiling to decrease falls risk with ADL and mobility    Baseline mild dizziness with rolling back to supine from L sidelying    Time 3    Period Weeks    Status On-going      PT LONG TERM GOAL #5   Title Pt will be able to sit down in tub and stand back up with supervision; pt will be able to perform shower in stand up shower with supervision only without LOB to increase independence with ADL    Baseline Requires min-mod A to transition from sitting > deep squat position > stand in shower    Time 3    Period Weeks    Status On-going                 Plan - 12/26/19 1329    Clinical Impression Statement PT continued to work on simulated tub transfers. Plan to have grab bars installed in next 1-2 weeks and mother was asking  about placement height for horizontal bar. PT suggesting just high enough to reach when  sitting down in tub so can use to pull up on but also be able to push more once partially up to stand the rest of the way.    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, childhood, nystagmus, visual field defect, sensorineural hearing loss of bilat ears    Rehab Potential Good    PT Frequency 2x / week    PT Duration 3 weeks    PT Treatment/Interventions ADLs/Self Care Home Management;Aquatic Therapy;Canalith Repostioning;DME Instruction;Gait training;Stair training;Functional mobility training;Patient/family education;Therapeutic activities;Orthotic Fit/Training;Therapeutic exercise;Balance training;Neuromuscular re-education;Vestibular    PT Next Visit Plan Did they install grab bars?  Continue to progress treadmill training; higher level balance and vestibular training, getting out of bathtub training - deep squat training. Awaiting approval for more visits for the rest of the year.    Consulted and Agree with Plan of Care Patient;Family member/caregiver    Family Member Consulted Mother           Patient will benefit from skilled therapeutic intervention in order to improve the following deficits and impairments:  Abnormal gait,Decreased coordination,Difficulty walking,Dizziness,Decreased activity tolerance,Decreased balance,Decreased strength,Impaired sensation,Pain  Visit Diagnosis: Other abnormalities of gait and mobility  Muscle weakness (generalized)  Unsteadiness on feet     Problem List Patient Active Problem List   Diagnosis Date Noted  . Impulsiveness 10/14/2019  . Mild episode of recurrent major depressive disorder ([place]) 09/04/2019  . Anxiety 08/16/2019  . 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  . 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 Status  post radiation therapy 11/22/2018  . Seasonal and perennial allergic rhinitis 11/22/2018  . Seasonal allergic conjunctivitis 11/22/2018  . Acute sinusitis 11/22/2018  . Keratosis pilaris 10/05/2017  . Melanocytic nevi of trunk 10/05/2017  . Adverse food reaction 02/09/2017  . Gastroesophageal reflux disease 02/09/2017  . Mild intermittent asthma with acute exacerbation 11/19/2015  . Mild persistent asthma without complication 83/09/4074  . Acute seasonal allergic rhinitis due to pollen 11/16/2015  . Anaphylactic shock due to adverse food reaction 11/16/2015  . Medulloblastoma ([place]) 11/16/2015  . Type 1 diabetes mellitus without complication ([place]) 80/88/1103  . Dry mouth 11/16/2015  . Xerosis cutis 11/16/2015  . Nystagmus 08/15/2014  . Visual field defect 08/15/2014  . Disequilibrium 07/25/2014  . 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    Electa Sniff, PT, DPT, NCS 12/26/2019, 1:32 PM  [facility] [address] [place], [name], [address] Phone: [phone]   Fax:  [phone]  Name: Tracey Perkins MRN: [id number] Date of Birth: 1985/09/05

## 2019-12-30 ENCOUNTER — Ambulatory Visit: Payer: Medicaid Other | Admitting: Physical Therapy

## 2020-01-02 ENCOUNTER — Other Ambulatory Visit: Payer: Self-pay

## 2020-01-02 ENCOUNTER — Ambulatory Visit: Payer: Medicaid Other | Admitting: Physical Therapy

## 2020-01-02 DIAGNOSIS — R2689 Other abnormalities of gait and mobility: Secondary | ICD-10-CM

## 2020-01-02 DIAGNOSIS — R2681 Unsteadiness on feet: Secondary | ICD-10-CM

## 2020-01-02 DIAGNOSIS — M6281 Muscle weakness (generalized): Secondary | ICD-10-CM | POA: Diagnosis not present

## 2020-01-02 DIAGNOSIS — R296 Repeated falls: Secondary | ICD-10-CM

## 2020-01-02 NOTE — Therapy (Signed)
[facility] [address] [place], [name], [address] Phone: [phone]   Fax:  [phone]  Physical Therapy Treatment  Patient Details  Name: Tracey Perkins MRN: [id number] Date of Birth: 05/13/1985 Referring Provider (PT): Erin Sons, MD   Encounter Date: 01/02/2020   PT End of Session - 01/02/20 1141    Visit Number 24    Number of Visits 26    Date for PT Re-Evaluation 01/06/20    Authorization Type 12/6 - 12/20    PT Start Time 0845    PT Stop Time 0933    PT Time Calculation (min) 48 min    Equipment Utilized During Treatment Gait belt    Activity Tolerance Patient tolerated treatment well    Behavior During Therapy [facility] for tasks assessed/performed           Past Medical History:  Diagnosis Date   Asthma    Brain tumor ([place])    Diabetes mellitus without complication ([place])    Eczema    Food allergy    Gastroparesis    Hypokalemia    Hypomagnesemia    Hypothyroid    Neuropathy    Post concussion syndrome 05/2019   Thyroid disease    Vision changes     Past Surgical History:  Procedure Laterality Date   BRAIN SURGERY     Portacath placement and removed     WISDOM TOOTH EXTRACTION      There were no vitals filed for this visit.   Subjective Assessment - 01/02/20 0852    Subjective Grab bars are installed, tried to use them but still needed help to get out of the tub.  Felt like her walking was off yesterday but did a lot of shopping and was very tired.  No changes to any medications.    Patient is accompained by: Family member   Mother, Tracey Perkins   Pertinent History asthma, eczema, food allergy, polyneuropathy, uncontrolled type 1 diabetes, gastroparesis, h/o chronic L thalamic infarct, new R thalamic infarct since 2016, lymphocytic thyroiditis, s/p radiation therapy, chemo and 2 surgical resection for medulloblastoma, childhood, nystagmus, visual field defect,  sensorineural hearing loss of bilat ears    Patient Stated Goals Getting back to walking independently and improve balance.  Going back to the gym and use the treadmill.    Currently in Pain? No/denies                             [facility] Adult PT Treatment/Exercise - 01/02/20 0854      Knee/Hip Exercises: Stretches   Active Hamstring Stretch Right;Left;2 reps;30 seconds;Limitations    Active Hamstring Stretch Limitations in supine to keep back straight, contralateral LE bent, used strap      Knee/Hip Exercises: Aerobic   Tread Mill 1.0 - 1.1 mph - with bilat UE support, focused on increasing step and stride length with heel strike, 8 minutes      Knee/Hip Exercises: Standing   Wall Squat 1 set;5 reps;10 seconds    Wall Squat Limitations mini wall squat with supervision            TREADMILL: Is safe to walk on the treadmill with supervision with both hands holding on.  1.2 mph start at 8 minutes.  0 elevation.   Every week increase time by 2 minutes.  Work up to 20-30 minutes and then start to increase speed gradually.    (This is in place  of walking outside if weather is bad)   Supine: Leg Stretch With Strap (Basic)    Lie on back with one knee bent, foot flat on floor. Hook strap around other foot. Straighten knee. Keep knee level with other knee. Hold __30_ seconds. Relax leg completely down to floor.  Repeat _2__ times per leg.  Do _2__ sessions per day.   Wall Squat    Feet shoulder width apart, __12__ inches in front of wall, lean against wall. Heels on floor bend hips and knees slightly like you are sitting in a tall chair. Hold __10__ seconds. Return to standing. Repeat __5__ times. Do __2__ sessions per day.      PT Education - 01/02/20 1140    Education Details updated HEP, plan for visits in the new year and may have to take a break for certification    Person(s) Educated Patient    Methods Explanation;Demonstration;Handout    Comprehension  Verbalized understanding;Returned demonstration            PT Short Term Goals - 12/02/19 0908      PT SHORT TERM GOAL #1   Title = LTG             PT Long Term Goals - 12/24/19 1300      PT LONG TERM GOAL #1   Title Patient will demonstrate ability to perform final HEP independently, walk on treadmill at home with supervision and return to the gym for ongoing wellness    Baseline Pt reports indep with HEP but not yet going to gym or doing TM training    Time 3    Period Weeks    Status On-going      PT LONG TERM GOAL #2   Title Pt will improve DGI without AD to >/= 22/24 points to indicate decreased falls risk    Baseline 20/24 12/09/19    Time 3    Period Weeks    Status On-going      PT LONG TERM GOAL #3   Title Pt will demonstrate ability to perform conditions 1/2 on MCTSIB for 30 seconds and perform condition 3/4 for up to 10 seconds to indicate improved sensory integration    Baseline 30 seconds for condition 1, 2; 5-6 seconds for condition 3, unable to perform condition 4 12/08/19    Time 3    Period Weeks    Status On-going      PT LONG TERM GOAL #4   Title Pt will report no dizziness or headaches with bed mobility, transfers, head/body turns, bending down to the floor or looking up at the ceiling to decrease falls risk with ADL and mobility    Baseline mild dizziness with rolling back to supine from L sidelying    Time 3    Period Weeks    Status On-going      PT LONG TERM GOAL #5   Title Pt will be able to sit down in tub and stand back up with supervision; pt will be able to perform shower in stand up shower with supervision only without LOB to increase independence with ADL    Baseline Requires min-mod A to transition from sitting > deep squat position > stand in shower    Time 3    Period Weeks    Status On-going                 Plan - 01/02/20 0932    Clinical Impression Statement Continued to utilize treadmill  for gait training for  increased step/stride length.  Adjusted HEP by adding supine hamstring stretches and small wall squats to continue to address ROM and strength limitations that limit patient's ability to transfer safely and independently out of the tub even with grab bars.  Pt continues to experience LOB with bending down to the floor.  Will continue to address tomorrow.    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, childhood, nystagmus, visual field defect, sensorineural hearing loss of bilat ears    Rehab Potential Good    PT Frequency 2x / week    PT Duration 3 weeks    PT Treatment/Interventions ADLs/Self Care Home Management;Aquatic Therapy;Canalith Repostioning;DME Instruction;Gait training;Stair training;Functional mobility training;Patient/family education;Therapeutic activities;Orthotic Fit/Training;Therapeutic exercise;Balance training;Neuromuscular re-education;Vestibular    PT Next Visit Plan Picking up objects from floor.   Continue to progress treadmill training; higher level balance and vestibular training, getting out of bathtub training - deep squat training. Awaiting approval for more visits for the rest of the year.    Consulted and Agree with Plan of Care Patient;Family member/caregiver    Family Member Consulted Mother           Patient will benefit from skilled therapeutic intervention in order to improve the following deficits and impairments:  Abnormal gait,Decreased coordination,Difficulty walking,Dizziness,Decreased activity tolerance,Decreased balance,Decreased strength,Impaired sensation,Pain  Visit Diagnosis: Other abnormalities of gait and mobility  Muscle weakness (generalized)  Unsteadiness on feet  Repeated falls     Problem List Patient Active  Problem List   Diagnosis Date Noted   Impulsiveness 10/14/2019   Mild episode of recurrent major depressive disorder ([place]) 09/04/2019   Anxiety 08/16/2019   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   Status post radiation therapy 11/22/2018   Seasonal and perennial allergic rhinitis 11/22/2018   Seasonal allergic conjunctivitis 11/22/2018   Acute sinusitis 11/22/2018   Keratosis pilaris 10/05/2017   Melanocytic nevi of trunk 10/05/2017   Adverse food reaction 02/09/2017   Gastroesophageal reflux disease 02/09/2017   Mild intermittent asthma with acute exacerbation 11/19/2015   Mild persistent asthma without complication 85/92/9244   Acute seasonal allergic rhinitis due to pollen 11/16/2015   Anaphylactic shock due to adverse food reaction 11/16/2015   Medulloblastoma ([place]) 11/16/2015   Type 1 diabetes mellitus without complication ([place]) 62/86/3817   Dry mouth 11/16/2015   Xerosis cutis 11/16/2015   Nystagmus 08/15/2014   Visual field defect 08/15/2014   Disequilibrium 07/25/2014   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    Rico Junker, PT, DPT 01/02/20    11:45 AM    [facility] [address] [place], [name], [address] Phone: [phone]   Fax:  [phone]  Name: Tracey Perkins MRN: [id number] Date of Birth: 11-Feb-1985

## 2020-01-02 NOTE — Patient Instructions (Addendum)
TREADMILL: Is safe to walk on the treadmill with supervision with both hands holding on.  1.2 mph start at 8 minutes.  0 elevation.   Every week increase time by 2 minutes.  Work up to 20-30 minutes and then start to increase speed gradually.    (This is in place of walking outside if weather is bad)   Supine: Leg Stretch With Strap (Basic)    Lie on back with one knee bent, foot flat on floor. Hook strap around other foot. Straighten knee. Keep knee level with other knee. Hold __30_ seconds. Relax leg completely down to floor.  Repeat _2__ times per leg.  Do _2__ sessions per day.   Wall Squat    Feet shoulder width apart, __12__ inches in front of wall, lean against wall. Heels on floor bend hips and knees slightly like you are sitting in a tall chair. Hold __10__ seconds. Return to standing. Repeat __5__ times. Do __2__ sessions per day.   Gaze Stabilization: Standing Feet Apart    Feet shoulder width apart, keeping eyes on target on wall __3__ feet away, tilt head down 15-30 and move head side to side for _30___ seconds. Repeat while moving head up and down for __30__ seconds. Do ___2_ sessions per day.   Feet Together, Head Motion - Eyes Open    Chair behind you and chair or counter in front for support/safety.  With eyes open, feet together, move head slowly: up and down 5 repetitions, Perform head turns to the left and right 5 repetitions.. Repeat _2_ times per session.    Push-Up (Sitting)    With a __4__ inch book under each hand, press down while lifting body - keep shoulders down. Use foot to help with balance but don't push up with legs. Hold __3-4__ seconds. Repeat ___5_ times. Do __2__ sessions per day.   EXTENSION: Standing - Resistance Band: Stable (Active)    Stand, right arm at side. Against red resistance band, draw arm backward, as far as possible, keeping elbow straight. Complete _2__ sets of __10_ repetitions.

## 2020-01-03 ENCOUNTER — Ambulatory Visit (INDEPENDENT_AMBULATORY_CARE_PROVIDER_SITE_OTHER): Payer: Medicaid Other | Admitting: Clinical

## 2020-01-03 ENCOUNTER — Ambulatory Visit: Payer: Medicaid Other | Admitting: Physical Therapy

## 2020-01-03 ENCOUNTER — Encounter: Payer: Self-pay | Admitting: Physical Therapy

## 2020-01-03 DIAGNOSIS — R2681 Unsteadiness on feet: Secondary | ICD-10-CM

## 2020-01-03 DIAGNOSIS — F330 Major depressive disorder, recurrent, mild: Secondary | ICD-10-CM | POA: Diagnosis not present

## 2020-01-03 DIAGNOSIS — R42 Dizziness and giddiness: Secondary | ICD-10-CM

## 2020-01-03 DIAGNOSIS — M6281 Muscle weakness (generalized): Secondary | ICD-10-CM

## 2020-01-03 DIAGNOSIS — R209 Unspecified disturbances of skin sensation: Secondary | ICD-10-CM

## 2020-01-03 DIAGNOSIS — R2689 Other abnormalities of gait and mobility: Secondary | ICD-10-CM

## 2020-01-03 DIAGNOSIS — R296 Repeated falls: Secondary | ICD-10-CM

## 2020-01-03 NOTE — Progress Notes (Signed)
   THERAPIST PROGRESS NOTE  Session Time: 45 minutes  Participation Level: Active  Behavioral Response: CasualAlertDepressed  Type of Therapy: Individual Therapy  Treatment Goals addressed: Diagnosis: depression  Interventions: CBT  Summary:  Tracey Perkins is a 34 y.o. female who presents for the scheduled session oriented times five, appropriately dressed, and friendly. Client denied hallucinations and delusions. Client reported on today feeling sad in her mood. Client reported since the last session she celebrated her birthday and spent it with one of her friend who she has known since the 7th grade. Client reported they met by attending the same cancer support groups. Client reported the day after her birthday this year her friend has been in the hospital since for complications with cancer. Client reported she has been thinking about the time spent with that friend. Client also reported this is the first year that she has spent Christmas as a single person and that is different for her. Client reported she has made some thoughtful gifts for her family for Christmas and enjoys doing for other people but, she is waiting for her moments of joy. Client reported on the other hand she is having small progress in her rehabilitation since her concussion in May but still needs help with some ADL's. Client reported she has been thinking about ways to use her thoughts of her friend in the hospital into something positive. Client reported she enjoys writing and wants to write stories and poetry about her friendship with her friend.    Suicidal/Homicidal: Nowithout intent/plan  Therapist Response:  Therapist began the session by checking in and asking how she has been doing since last seen. Therapist actively listened to the clients thoughts and feelings. Therapist asked open ended questions about how the client has been processing her feelings of sadness to the transitions going on. Therapist  used CBT to reinforce the clients existing hobbies and interests and using it to help with her current thoughts and feelings. Client was scheduled for next appointments.    Plan: Return again in 8 weeks for individual therapy.  Diagnosis: Mild episode of recurrent major depressive disorder  Woodroe Vogan Y Mava Suares, LCSW 01/03/2020

## 2020-01-03 NOTE — Therapy (Signed)
[facility] [address] [place], [name], [address] Phone: [phone]   Fax:  [phone]  Physical Therapy Treatment  Patient Details  Name: Tracey Perkins MRN: [id number] Date of Birth: Mar 15, 1985 Referring Provider (PT): Erin Sons, MD   Encounter Date: 01/03/2020   PT End of Session - 01/03/20 1549    Visit Number 25    Number of Visits 26    Date for PT Re-Evaluation 01/06/20    Authorization Type 12/6 - 12/20    PT Start Time 1237    PT Stop Time 1323    PT Time Calculation (min) 46 min    Activity Tolerance Patient tolerated treatment well    Behavior During Therapy [facility] for tasks assessed/performed           Past Medical History:  Diagnosis Date   Asthma    Brain tumor ([place])    Diabetes mellitus without complication ([place])    Eczema    Food allergy    Gastroparesis    Hypokalemia    Hypomagnesemia    Hypothyroid    Neuropathy    Post concussion syndrome 05/2019   Thyroid disease    Vision changes     Past Surgical History:  Procedure Laterality Date   BRAIN SURGERY     Portacath placement and removed     WISDOM TOOTH EXTRACTION      There were no vitals filed for this visit.   Subjective Assessment - 01/03/20 1241    Subjective Nothing new to report since yesterday.  No soreness in LE from stretches or wall squats.  Feels like her eyes are a little jumpy today and would like to wait and perform reaching to the floor next session.    Patient is accompained by: Family member   Mother, [name]   Pertinent History asthma, eczema, food allergy, polyneuropathy, uncontrolled type 1 diabetes, gastroparesis, h/o chronic L thalamic infarct, new R thalamic infarct since 2016, lymphocytic thyroiditis, s/p radiation therapy, chemo and 2 surgical resection for medulloblastoma, childhood, nystagmus, visual field defect, sensorineural hearing loss of bilat ears    Patient Stated  Goals Getting back to walking independently and improve balance.  Going back to the gym and use the treadmill.    Currently in Pain? No/denies                             [facility] Adult PT Treatment/Exercise - 01/03/20 1257      Exercises   Exercises Knee/Hip      Knee/Hip Exercises: Aerobic   Tread Mill 1.1 mph - with bilat UE support, improved step and stride length bilaterally.  Improved heel strike - therapist assisted with pelvic rotation and increasing weight shift laterally.      Knee/Hip Exercises: Standing   Functional Squat 3 sets;10 reps;Limitations    Functional Squat Limitations Performed progressively lower and lower squats <> standing to focus on anterior weight shift over BOS and LE strengthening.  Began with pt performing from bench x 10 reps > 14" box x 10 reps with supervision and no UE support on bars.  Last set pt performed from 6" box; only performed 3 reps with UE support on bars, therapist assisted with rocking forwards for full anterior weight shift.                  PT Education - 01/03/20 1549    Education Details continued to answer  questions about visits in the new year    Person(s) Educated Patient    Methods Explanation    Comprehension Verbalized understanding            PT Short Term Goals - 12/02/19 0908      PT SHORT TERM GOAL #1   Title = LTG             PT Long Term Goals - 12/24/19 1300      PT LONG TERM GOAL #1   Title Patient will demonstrate ability to perform final HEP independently, walk on treadmill at home with supervision and return to the gym for ongoing wellness    Baseline Pt reports indep with HEP but not yet going to gym or doing TM training    Time 3    Period Weeks    Status On-going      PT LONG TERM GOAL #2   Title Pt will improve DGI without AD to >/= 22/24 points to indicate decreased falls risk    Baseline 20/24 12/09/19    Time 3    Period Weeks    Status On-going      PT LONG TERM  GOAL #3   Title Pt will demonstrate ability to perform conditions 1/2 on MCTSIB for 30 seconds and perform condition 3/4 for up to 10 seconds to indicate improved sensory integration    Baseline 30 seconds for condition 1, 2; 5-6 seconds for condition 3, unable to perform condition 4 12/08/19    Time 3    Period Weeks    Status On-going      PT LONG TERM GOAL #4   Title Pt will report no dizziness or headaches with bed mobility, transfers, head/body turns, bending down to the floor or looking up at the ceiling to decrease falls risk with ADL and mobility    Baseline mild dizziness with rolling back to supine from L sidelying    Time 3    Period Weeks    Status On-going      PT LONG TERM GOAL #5   Title Pt will be able to sit down in tub and stand back up with supervision; pt will be able to perform shower in stand up shower with supervision only without LOB to increase independence with ADL    Baseline Requires min-mod A to transition from sitting > deep squat position > stand in shower    Time 3    Period Weeks    Status On-going                 Plan - 01/03/20 1549    Clinical Impression Statement Pt demonstrated significant improvement in gait sequencing today on treadmill with improved weight shifting and step/stride length.  Continued to focus on functional LE strengthening and weight shifting with deep squats > stand with progressively lower squats to improve patient's ability to safely stand from sitting in tub.    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, childhood, nystagmus, visual field defect, sensorineural hearing loss of bilat ears    Rehab Potential Good    PT Frequency 2x / week    PT Duration 3 weeks    PT Treatment/Interventions  ADLs/Self Care Home Management;Aquatic Therapy;Canalith Repostioning;DME Instruction;Gait training;Stair training;Functional mobility training;Patient/family education;Therapeutic activities;Orthotic Fit/Training;Therapeutic exercise;Balance training;Neuromuscular re-education;Vestibular    PT  Next Visit Plan Check LTG and recert; request visits for new year and have front call mom about scheduling (mom may not be able to bring pt for two weeks in january).  Picking up objects from floor.   Continue to progress treadmill training; higher level balance and vestibular training, getting out of bathtub training - deep squat training.    Consulted and Agree with Plan of Care Patient;Family member/caregiver    Family Member Consulted Mother           Patient will benefit from skilled therapeutic intervention in order to improve the following deficits and impairments:  Abnormal gait,Decreased coordination,Difficulty walking,Dizziness,Decreased activity tolerance,Decreased balance,Decreased strength,Impaired sensation,Pain  Visit Diagnosis: Other abnormalities of gait and mobility  Muscle weakness (generalized)  Unsteadiness on feet  Repeated falls  Dizziness and giddiness  Unspecified disturbances of skin sensation     Problem List Patient Active Problem List   Diagnosis Date Noted   Impulsiveness 10/14/2019   Mild episode of recurrent major depressive disorder ([place]) 09/04/2019   Anxiety 08/16/2019   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   Status post radiation therapy 11/22/2018   Seasonal and perennial allergic rhinitis 11/22/2018   Seasonal allergic conjunctivitis 11/22/2018   Acute sinusitis 11/22/2018   Keratosis pilaris 10/05/2017   Melanocytic nevi of trunk 10/05/2017   Adverse food reaction 02/09/2017   Gastroesophageal reflux disease 02/09/2017   Mild intermittent asthma with acute exacerbation 11/19/2015   Mild  persistent asthma without complication 28/11/8865   Acute seasonal allergic rhinitis due to pollen 11/16/2015   Anaphylactic shock due to adverse food reaction 11/16/2015   Medulloblastoma ([place]) 11/16/2015   Type 1 diabetes mellitus without complication ([place]) 73/73/6681   Dry mouth 11/16/2015   Xerosis cutis 11/16/2015   Nystagmus 08/15/2014   Visual field defect 08/15/2014   Disequilibrium 07/25/2014   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    Rico Junker, PT, DPT 01/03/20    3:53 PM    [facility] [address] [place], [name], [address] Phone: [phone]   Fax:  [phone]  Name: Tracey Perkins MRN: [id number] Date of Birth: May 18, 1985

## 2020-01-06 ENCOUNTER — Other Ambulatory Visit: Payer: Self-pay

## 2020-01-06 ENCOUNTER — Encounter: Payer: Self-pay | Admitting: Physical Therapy

## 2020-01-06 ENCOUNTER — Ambulatory Visit: Payer: Medicaid Other | Admitting: Physical Therapy

## 2020-01-06 DIAGNOSIS — R2681 Unsteadiness on feet: Secondary | ICD-10-CM

## 2020-01-06 DIAGNOSIS — M6281 Muscle weakness (generalized): Secondary | ICD-10-CM

## 2020-01-06 DIAGNOSIS — R42 Dizziness and giddiness: Secondary | ICD-10-CM

## 2020-01-06 DIAGNOSIS — R2689 Other abnormalities of gait and mobility: Secondary | ICD-10-CM

## 2020-01-06 DIAGNOSIS — R209 Unspecified disturbances of skin sensation: Secondary | ICD-10-CM

## 2020-01-06 DIAGNOSIS — R296 Repeated falls: Secondary | ICD-10-CM

## 2020-01-06 NOTE — Therapy (Signed)
Tracey Perkins [address] [place], [name], [address] Phone: [phone]   Fax:  [phone]  Physical Therapy Treatment  Patient Details  Name: Tracey Perkins MRN: [id number] Date of Birth: 1985-03-01 Referring Provider (PT): Erin Sons, MD   Encounter Date: 01/06/2020   PT End of Session - 01/06/20 1222    Visit Number 26    Number of Visits 26    Date for PT Re-Evaluation 01/06/20    Authorization Type 12/6 - 12/20    PT Start Time 0849    PT Stop Time 0939    PT Time Calculation (min) 50 min    Activity Tolerance Patient tolerated treatment well    Behavior During Therapy [facility] for tasks assessed/performed           Past Medical History:  Diagnosis Date  . Asthma   . Brain tumor ([place])   . Diabetes mellitus without complication ([place])   . Eczema   . Food allergy   . Gastroparesis   . Hypokalemia   . Hypomagnesemia   . Hypothyroid   . Neuropathy   . Post concussion syndrome 05/2019  . Thyroid disease   . Vision changes     Past Surgical History:  Procedure Laterality Date  . BRAIN SURGERY    . Portacath placement and removed    . WISDOM TOOTH EXTRACTION      There were no vitals filed for this visit.   Subjective Assessment - 01/06/20 0854    Subjective Legs were really sore after squats.  Would like to stretch before starting to check goals.  Used shoes to get out of tub, still needed min A.    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   Going back to the gym and use the treadmill.    Currently in Pain? No/denies              [facility] PT Assessment - 01/06/20 0904      Assessment   Medical Diagnosis Disequilibrium, Traumatic injury of head, multiple falls, gait abnormality    Referring Provider (PT) Erin Sons, MD    Onset Date/Surgical Date 06/07/19    Hand Dominance Right    Prior Therapy yes - acute and inpatient rehab at [facility]      Precautions   Precautions Other (comment)    Precaution Comments asthma, eczema, food allergy, polyneuropathy, uncontrolled type 1 diabetes, gastroparesis, h/o chronic L thalamic infarct, new R thalamic infarct since 2016, lymphocytic thyroiditis, s/p radiation therapy, chemo and 2 surgical resection for medulloblastoma, childhood, nystagmus, visual field defect, sensorineural hearing loss of bilat ears      Prior Function   Level of Independence Independent      Observation/Other Assessments   Focus on Therapeutic Outcomes (FOTO)  Not applicable - Medicaid      Dynamic Gait Index   Level Surface Normal    Change in Gait Speed Mild Impairment    Gait with Horizontal Head Turns Normal    Gait with Vertical Head Turns Normal    Gait and Pivot Turn Mild Impairment  Step Over Obstacle Mild Impairment    Step Around Obstacles Normal    Steps Mild Impairment    Total Score 20    DGI comment: 20/24               Vestibular Assessment - 01/06/20 0905      Balancemaster   Balancemaster Comment MCTSIB: 30 seconds condition 1 and 2; Condition 3 10 seconds; Condition 4: 4-5 seconds      Positional Sensitivities   Sit to Supine No dizziness    Supine to Sitting No dizziness    Nose to Right Knee No dizziness   disorientation   Right Knee to Sitting No dizziness   disorientation   Nose to Left Knee No dizziness   disorientation   Left Knee to Sitting No dizziness   disorientation   Head Turning x 5 No dizziness    Head Nodding x 5 No dizziness    Pivot Right in  Standing No dizziness   disorientation   Pivot Left in Standing No dizziness   disorientation   Positional Sensitivities Comments no significant sense of dizziness, some disequilibrium and feeling like eyes need to catch up                    [facility] Adult PT Treatment/Exercise - 01/06/20 0856      Knee/Hip Exercises: Stretches   Quad Stretch Right;Left;2 reps;60 seconds    Quad Stretch Limitations in prone, passive adding in contract-relax    Other Knee/Hip Stretches Child's pose on mat x 30 seconds                  PT Education - 01/06/20 1220    Education Details progress towards goals, areas to continue to address in new year - balance on compliant surfaces, balance with vision removed, dynamic gait, getting up from sitting on floor for tub    Person(s) Educated Patient;Parent(s)    Methods Explanation    Comprehension Verbalized understanding            PT Short Term Goals - 12/02/19 0908      PT SHORT TERM GOAL #1   Title = LTG             PT Long Term Goals - 01/06/20 0929      PT LONG TERM GOAL #1   Title Patient will demonstrate ability to perform final HEP independently, walk on treadmill at home with supervision and return to the gym for ongoing wellness    Baseline is performing HEP but not performing treadmill consistently    Time 3    Period Weeks    Status Partially Met      PT LONG TERM GOAL #2   Title Pt will improve DGI without AD to >/= 22/24 points to indicate decreased falls risk    Baseline 20/24 - no change from November    Time 3    Period Weeks    Status Not Met      PT LONG TERM GOAL #3   Title Pt will demonstrate ability to perform conditions 1/2 on MCTSIB for 30 seconds and perform condition 3/4 for up to 10 seconds to indicate improved sensory integration    Baseline Conditions 1/2: 30 seconds, Conditions 3: 10 seconds, Condition 4: 4-5 seconds    Time 3    Period Weeks    Status Partially Met      PT LONG TERM GOAL  #4   Title Pt  will report no dizziness or headaches with bed mobility, transfers, head/body turns, bending down to the floor or looking up at the ceiling to decrease falls risk with ADL and mobility    Baseline No headaches; mild dizziness or disorientation with bending down to ground and turning quickly    Time 3    Period Weeks    Status Partially Met      PT LONG TERM GOAL #5   Title Pt will be able to sit down in tub and stand back up with supervision; pt will be able to perform shower in stand up shower with supervision only without LOB to increase independence with ADL    Baseline Can perform stand up shower in small shower with supervision; stood up from tub with water shoes on with 25% assistance    Time 3    Period Weeks    Status Partially Met            New goals for recertification:  PT Long Term Goals - 01/06/20 2017      PT LONG TERM GOAL #1   Title Patient will demonstrate ability to perform final HEP independently, walk on treadmill at home with supervision and return to the gym for ongoing wellness    Baseline is performing HEP but not performing treadmill consistently    Time 4    Period Weeks    Status Revised      PT LONG TERM GOAL #2   Title Pt will improve DGI without AD to >/= 22/24 points to indicate decreased falls risk    Baseline 20/24    Time 4    Period Weeks    Status Revised      PT LONG TERM GOAL #3   Title Pt will demonstrate ability to perform MCTSIB condition 3 for 15 seconds and condition 4 for up to 10 seconds to indicate improved sensory integration    Baseline Conditions 1/2: 30 seconds, Conditions 3: 10 seconds, Condition 4: 4-5 seconds    Time 4    Period Weeks    Status Revised      PT LONG TERM GOAL #4   Title Pt will report no dizziness with quick head/body turns, bending down to the floor in order to decrease falls risk with ADL and mobility    Baseline No headaches; mild dizziness or disorientation with bending down to ground  and turning quickly    Time 4    Period Weeks    Status Revised      PT LONG TERM GOAL #5   Title Pt will be able to sit down in tub and stand back up with supervision without LOB to increase independence with ADL's    Baseline Can perform stand up shower in small shower with supervision; stood up from tub with water shoes on with 25% assistance    Time 4    Period Weeks    Status Revised                 Plan - 01/06/20 1222    Clinical Impression Statement Pt is making steady progress and has partially met 4/5 LTG.  Pt demonstrates decreased motion sensitivity but continues to experience disequilibrium with quick turns and bending down to the floor.  Pt's DGI score did not change since STG check and pt continues to experience mild LOB with changes in gait speed and when descending stairs.  Pt demonstrates improved balance with head turns, stepping over and around  obstacles.  Pt is making progress with tub transfers and is only requiring minimal assistance to perform.  Pt continues to demonstrate significant difficulty maintaining balance when on compliant surfaces and when vision removed.  Pt will benefit from continued skilled PT services to address ongoing impairments to maximize functional mobility independence and decrease falls risk.    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, childhood, nystagmus, visual field defect, sensorineural hearing loss of bilat ears    Rehab Potential Good    PT Frequency 1x / week    PT Duration 4 weeks    PT Treatment/Interventions ADLs/Self Care Home Management;Aquatic Therapy;Canalith Repostioning;DME Instruction;Gait training;Stair training;Functional mobility training;Patient/family education;Therapeutic activities;Orthotic  Fit/Training;Therapeutic exercise;Balance training;Neuromuscular re-education;Vestibular    PT Next Visit Plan Picking up objects from floor.  Balance on compliant surfaces and with vision removed.  Walking outside with walking stick?  Descending stairs. Continue to progress treadmill training; higher level balance and vestibular training, getting out of bathtub training - deep squat training.    Consulted and Agree with Plan of Care Patient;Family member/caregiver    Family Member Consulted Mother           Patient will benefit from skilled therapeutic intervention in order to improve the following deficits and impairments:  Abnormal gait,Decreased coordination,Difficulty walking,Dizziness,Decreased activity tolerance,Decreased balance,Decreased strength,Impaired sensation,Pain  Visit Diagnosis: Other abnormalities of gait and mobility  Muscle weakness (generalized)  Unsteadiness on feet  Repeated falls  Dizziness and giddiness  Unspecified disturbances of skin sensation     Problem List Patient Active Problem List   Diagnosis Date Noted  . Impulsiveness 10/14/2019  . Mild episode of recurrent major depressive disorder ([place]) 09/04/2019  . Anxiety 08/16/2019  . 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  . 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 Status post radiation therapy 11/22/2018  . Seasonal and perennial allergic rhinitis 11/22/2018  . Seasonal allergic conjunctivitis 11/22/2018  . Acute sinusitis 11/22/2018  . Keratosis pilaris 10/05/2017  . Melanocytic nevi of trunk 10/05/2017  . Adverse food reaction 02/09/2017  . Gastroesophageal reflux disease 02/09/2017  . Mild intermittent asthma with acute exacerbation 11/19/2015  . Mild persistent asthma without complication 00/76/2263  . Acute seasonal allergic rhinitis due to pollen 11/16/2015  . Anaphylactic shock due to adverse food reaction 11/16/2015  . 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 Type 1 diabetes mellitus  without complication ([place]) 33/54/5625  . Dry mouth 11/16/2015  . Xerosis cutis 11/16/2015  . Nystagmus 08/15/2014  . Visual field defect 08/15/2014  . Disequilibrium 07/25/2014  . 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    Rico Junker, PT, DPT 01/06/20    8:16 PM    [facility] [address] [place], [name], [address] Phone: [phone]   Fax:  [phone]  Name: DAVIDA FALCONI MRN: [id number] Date of Birth: January 05, 1986  Managed medicaid CPT codes: [phone]- Therapeutic Exercise, [phone]- Neuro Re-education, [phone] - Gait Training, [phone] - Therapeutic Activities, [phone] - [place], [phone] - Aquatic therapy and [phone] - Canalith Repositioning

## 2020-01-20 ENCOUNTER — Encounter (HOSPITAL_COMMUNITY): Payer: Self-pay | Admitting: Psychiatry

## 2020-01-20 ENCOUNTER — Other Ambulatory Visit: Payer: Self-pay

## 2020-01-20 ENCOUNTER — Telehealth (INDEPENDENT_AMBULATORY_CARE_PROVIDER_SITE_OTHER): Payer: Medicaid Other | Admitting: Psychiatry

## 2020-01-20 DIAGNOSIS — F419 Anxiety disorder, unspecified: Secondary | ICD-10-CM | POA: Diagnosis not present

## 2020-01-20 DIAGNOSIS — F330 Major depressive disorder, recurrent, mild: Secondary | ICD-10-CM | POA: Diagnosis not present

## 2020-01-20 MED ORDER — CITALOPRAM HYDROBROMIDE 10 MG PO TABS: 5.0000 mg | ORAL_TABLET | Freq: Every day | ORAL | 2 refills | Status: DC

## 2020-01-20 NOTE — Progress Notes (Signed)
[place] MD/PA/NP OP Progress Note Virtual Visit via Telephone Note  I connected with [name] on 01/20/20 at 10:30 AM EST by telephone and verified that I am speaking with the correct person using two identifiers.  Location: Patient: home Provider: Clinic   I discussed the limitations, risks, security and privacy concerns of performing an evaluation and management service by telephone and the availability of in person appointments. I also discussed with the patient that there may be a patient responsible charge related to this service. The patient expressed understanding and agreed to proceed.   I provided 30 minutes of non-face-to-face time during this encounter.      01/20/2020 11:03 AM Tracey Perkins  MRN:  [id number]  Chief Complaint:  "I'm doing well"     HPI: 35 year old female seen today for follow up psychiatric evaluation.   She has a psychiatric history of depression and anxiey.  She is currently being managed on Celexa 5 mg daily and Abilify 2 mg. She notes that she discontinued Abilify due to it potentially increasing her blood sugar. She notes that her medication is effective in controlling her anxiety and depression.   Today patient is unable to log in virtually so the exam was done over the telephone. She notes that over the last two weeks she has been more depressed because her best friend died ofcancer. She notes that she besides grieving the death of her friend she feels like Celexa is effective in managing her psychiatric conditions.  Today she denies SI/HI/VAH or paranoia.  Patient notes that she has not had impulsive behaviors lately.  She did note that she called her ex-boyfriend to inform her of the death of their friend however denies any other impulsive behaviors.  Patient notes that her mother may have Covid 31 and reports that she is concerned about her mother's health.  She notes that she is trying to isolate from her mother.  No medication changes made  today.  Patient agreeable to continue Celexa as prescribed.  She is also agreeable to discontinue Abilify.  Patient will follow up with outpatient counseling for therapy.  No other concerns noted at this time.   Visit Diagnosis:    ICD-10-CM   1. Anxiety  F41.9 citalopram (CELEXA) 10 MG tablet  2. Mild episode of recurrent major depressive disorder (HCC)  F33.0 citalopram (CELEXA) 10 MG tablet    Past Psychiatric History: Anxiety and depression   Past Medical History:  Past Medical History:  Diagnosis Date  . Asthma   . Brain tumor ([place])   . Diabetes mellitus without complication ([place])   . Eczema   . Food allergy   . Gastroparesis   . Hypokalemia   . Hypomagnesemia   . Hypothyroid   . Neuropathy   . Post concussion syndrome 05/2019  . Thyroid disease   . Vision changes     Past Surgical History:  Procedure Laterality Date  . BRAIN SURGERY    . Portacath placement and removed    . WISDOM TOOTH EXTRACTION      Family Psychiatric History: Maternal uncle schizophrenia and overdosed on pain pills. Paternal cousin and aunt bipolar  Family History:  Family History  Problem Relation Age of Onset  . Thyroid disease Mother   . Eczema Sister   . Thyroid disease Sister   . Thyroid disease Sister     Social History:  Social History   Socioeconomic History  . Marital status: Single    Spouse name: Not  on file  . Number of children: Not on file  . Years of education: Not on file  . Highest education level: Not on file  Occupational History  . Not on file  Tobacco Use  . Smoking status: Never Smoker  . Smokeless tobacco: Never Used  Vaping Use  . Vaping Use: Never used  Substance and Sexual Activity  . Alcohol use: No  . Drug use: No  . Sexual activity: Yes    Birth control/protection: Pill  Other Topics Concern  . Not on file  Social History Narrative  . Not on file   Social Determinants of Health   Financial Resource Strain: Not on file  Food Insecurity:  Not on file  Transportation Needs: Not on file  Physical Activity: Not on file  Stress: Not on file  Social Connections: Not on file    Allergies:  Allergies  Allergen Reactions  . Banana Other (See Comments)    Mouth burning and swollen  . Bactrim [Sulfamethoxazole-Trimethoprim] Hives  . Doxycycline Hives  . Gentian Violet Other (See Comments)    "It burns."  Per mother  . Gentian Violet-Proflavine Sulfate [Triple Dye] Other (See Comments)    Mouth burn  . Mold Extract [Trichophyton] Other (See Comments)    Hay Fever  . Morphine And Related Hives  . Other Other (See Comments) and Hives    "Hay fever, dust and pollen."  Per patient.  . Vancomycin Other (See Comments)    Red man syndrome     Metabolic Disorder Labs: No results found for: HGBA1C, MPG No results found for: PROLACTIN No results found for: CHOL, TRIG, HDL, CHOLHDL, VLDL, LDLCALC No results found for: TSH  Therapeutic Level Labs: No results found for: LITHIUM No results found for: VALPROATE No components found for:  CBMZ  Current Medications: Current Outpatient Medications  Medication Sig Dispense Refill  . acetaminophen (TYLENOL) 500 MG tablet Take 500 mg by mouth every 6 (six) hours as needed for moderate pain.     . ARIPiprazole (ABILIFY) 2 MG tablet Take 1 tablet (2 mg total) by mouth daily. (Patient not taking: Reported on 12/25/2019) 30 tablet 2  . aspirin EC 81 MG tablet Take 81 mg by mouth daily. Swallow whole.    Marland Kitchen atorvastatin (LIPITOR) 40 MG tablet Take 40 mg by mouth at bedtime.    Marland Kitchen azelastine (ASTELIN) 0.1 % nasal spray Place 2 sprays into both nostrils 2 (two) times daily. 30 mL 5  . cetirizine (ZYRTEC) 10 MG tablet Take 1 tablet (10 mg total) by mouth daily. 34 tablet 5  . citalopram (CELEXA) 10 MG tablet Take 0.5 tablets (5 mg total) by mouth daily. Patient will cut pill in half. She has a traumatic brain injury and will take half the dose. 30 tablet 2  . Continuous Blood Gluc Sensor  (FREESTYLE LIBRE SENSOR SYSTEM) MISC USE 1 DEVICE EVERY 10 (TEN) DAYS.  11  . Continuous Blood Gluc Transmit (DEXCOM G6 TRANSMITTER) MISC USE 1 EACH EVERY 3 (THREE) MONTHS    . cyanocobalamin 1000 MCG tablet Take by mouth daily.    Marland Kitchen EPINEPHrine 0.3 mg/0.3 mL IJ SOAJ injection Use as directed for severe allergic reaction. (Patient taking differently: Inject 0.3 mg into the muscle as needed for anaphylaxis. Use as directed for severe allergic reaction.) 2 Device 1  . fluticasone (FLONASE) 50 MCG/ACT nasal spray 2 sprays per nostril daily as needed for stuffy nose. 16 g 5  . fluticasone (FLOVENT HFA) 110 MCG/ACT inhaler  Inhale 2 puffs into the lungs daily. Rinse, gargle and spit out after use. 1 each 5  . gabapentin (NEURONTIN) 300 MG capsule Take 300 mg by mouth at bedtime.   3  . glucagon (GLUCAGON EMERGENCY) 1 MG injection Inject 1 mg into the skin once as needed (for diabeties).  (Patient not taking: Reported on 12/25/2019)    . Glucagon 3 MG/DOSE POWD as needed.    Marland Kitchen glucose blood test strip Use 3 (three) times daily Use as instructed.    . insulin aspart (NOVOLOG) 100 UNIT/ML injection USE UP TO 130 UNITS DAILY, AS DIRECTED IN INSULIN PUMP.    Marland Kitchen insulin glargine (LANTUS) 100 UNIT/ML injection Inject 16 Units into the skin daily.  (Patient not taking: Reported on 12/25/2019)    . Insulin Human (INSULIN PUMP) SOLN Inject 80 each into the skin daily. Novolog Insulin Pump    . KLOR-CON M10 10 MEQ tablet Take 10 mEq by mouth daily.    Marland Kitchen levothyroxine (SYNTHROID, LEVOTHROID) 88 MCG tablet Take 100 mcg by mouth daily before breakfast.     . lubiprostone (AMITIZA) 8 MCG capsule Take 16 mcg by mouth daily with breakfast.     . magnesium oxide (MAG-OX) 400 MG tablet Take 800 mg by mouth at bedtime.    . meclizine (ANTIVERT) 25 MG tablet Take 25 mg by mouth 2 (two) times daily as needed for dizziness.  (Patient not taking: Reported on 12/25/2019)    . montelukast (SINGULAIR) 10 MG tablet Take 1 tablet (10 mg  total) by mouth at bedtime. To prevent coughing or wheezing 34 tablet 5  . norgestimate-ethinyl estradiol (ORTHO-CYCLEN) 0.25-35 MG-MCG tablet Take 1 tablet by mouth daily.     Marland Kitchen PROAIR HFA 108 (90 Base) MCG/ACT inhaler TAKE 2 PUFFS BY MOUTH EVERY 4 HOURS AS NEEDED (Patient taking differently: Inhale 2 puffs into the lungs every 6 (six) hours as needed for wheezing or shortness of breath. ) 6.7 g 0   No current facility-administered medications for this visit.     Musculoskeletal: Strength & Muscle Tone: Unable to assess due to telephone visit Gait & Station: Unable to assess due to telephone visit Patient leans: N/A  Psychiatric Specialty Exam: Review of Systems  There were no vitals taken for this visit.There is no height or weight on file to calculate BMI.  General Appearance: Unable to assess due to telephone visit  Eye Contact:  Unable to assess due to telephone visit  Speech:  Clear and Coherent and Normal Rate  Volume:  Normal  Mood:  Euthymic and Notes that she has been more anxious and depressed due to the passing of her best friend.  Affect:  Appropriate and Congruent  Thought Process:  Coherent, Goal Directed and Linear  Orientation:  Full (Time, Place, and Person)  Thought Content: WDL and Logical   Suicidal Thoughts:  No  Homicidal Thoughts:  No  Memory:  Immediate;   Fair Recent;   Fair Remote;   Fair  Judgement:  Fair  Insight:  Good and Fair  Psychomotor Activity:  Normal  Concentration:  Concentration: Good and Attention Span: Good  Recall:  Good  Fund of Knowledge: Good  Language: Good  Akathisia:  No  Handed:  Right  AIMS (if indicated):Not done  Assets:  Communication Skills Desire for Improvement Financial Resources/Insurance Housing Social Support  ADL's:  Intact  Cognition: WNL  Sleep:  Good   Screenings: Science writer from 09/17/2019 in [facility] Nutrition from 08/20/2014 in [facility]  PHQ-2 Total Score 2 0  PHQ-9 Total Score 7 -       Assessment and Plan: Patient notes that she is occassionally depressed and anxious due to the passing of her best friend.  He however notes that Celexa is effective in managing her psychiatric conditions.  No medication changes made today.  Patient agreeable to continue medications as prescribed.  1. Anxiety  Continue- citalopram (CELEXA) 10 MG tablet; Take 0.5 tablets (5 mg total) by mouth daily. Patient will cut pill in half. She has a traumatic brain injury and will take half the dose.  Dispense: 30 tablet; Refill: 2  2. Mild episode of recurrent major depressive disorder (HCC)  Continue- citalopram (CELEXA) 10 MG tablet; Take 0.5 tablets (5 mg total) by mouth daily. Patient will cut pill in half. She has a traumatic brain injury and will take half the dose.  Dispense: 30 tablet; Refill: 2     Follow-up in 3 months Follow-up with therapy  Shanna Cisco, NP 01/20/2020, 11:03 AM

## 2020-02-05 ENCOUNTER — Ambulatory Visit (INDEPENDENT_AMBULATORY_CARE_PROVIDER_SITE_OTHER): Payer: Medicaid Other | Admitting: Clinical

## 2020-02-05 ENCOUNTER — Other Ambulatory Visit: Payer: Self-pay

## 2020-02-05 DIAGNOSIS — F330 Major depressive disorder, recurrent, mild: Secondary | ICD-10-CM | POA: Diagnosis not present

## 2020-02-05 NOTE — Progress Notes (Signed)
   THERAPIST PROGRESS NOTE  Virtual Visit via Telephone Note  I connected with [name] on 02/05/20 at  9:00 AM EST by telephone and verified that I am speaking with the correct person using two identifiers.  Location: Patient: home Provider: office   I discussed the limitations, risks, security and privacy concerns of performing an evaluation and management service by telephone and the availability of in person appointments. I also discussed with the patient that there may be a patient responsible charge related to this service. The patient expressed understanding and agreed to proceed.  Follow Up Instructions: I discussed the assessment and treatment plan with the patient. The patient was provided an opportunity to ask questions and all were answered. The patient agreed with the plan and demonstrated an understanding of the instructions.   The patient was advised to call back or seek an in-person evaluation if the symptoms worsen or if the condition fails to improve as anticipated.   Session Time: 30 minutes  Participation Level: Active  Behavioral Response: NAAlertDepressed  Type of Therapy: Individual Therapy  Treatment Goals addressed: Diagnosis: Depression  Interventions: CBT  Summary:  Tracey Perkins is a 35 y.o. female who presents for the scheduled session oriented times five, friendly, and cooperative. Client denied hallucinations and delusions.  Client reported on today that she is doing better. Client reported since the last session her friend that she previously reported was in intensive care has passed away. Client reported they grew up together while going through cancer treatments. Client reported she didn't know how she would react but she has done a lot of "thinkig of good memories and then crying". Client reported she has decided that in her friends honor she is going to do a charity fundraiser through the cancer treatment center they both attended. Client  reported she is organizing it herself and contacting others to see if they would like to be a part of it. Client reported her mother vocalized concerns that she is obsessing over organizing the fundraiser to the point that she neglects eating, checking her medication and etc.    Suicidal/Homicidal: Nowithout intent/plan  Therapist Response: Therapist began the session by checking in and asking how she has been doing since last seen. Therapist actively listened to the clients thoughts and feelings. Therapist used empathy to strengthen the therapeutic relationship. Therapist used CBT to discuss how her hyperfocus and obsessive behaviors.  Therapist assigned the client homework to allot a few hours to focus on organizing her event and then stop to do other things during the day. Client was scheduled for a follow up session.   Plan: Return again in 4 weeks for individual therapy.  Diagnosis: Mild episode of recurrent major depressive disorder   Haevyn Ury Y Kiondre Grenz, LCSW 02/05/2020

## 2020-02-24 ENCOUNTER — Encounter: Payer: Medicaid Other | Admitting: Physical Therapy

## 2020-03-02 ENCOUNTER — Other Ambulatory Visit: Payer: Self-pay

## 2020-03-02 ENCOUNTER — Encounter: Payer: Self-pay | Admitting: Physical Therapy

## 2020-03-02 ENCOUNTER — Ambulatory Visit: Payer: Medicaid Other | Attending: Neurology | Admitting: Physical Therapy

## 2020-03-02 DIAGNOSIS — R2689 Other abnormalities of gait and mobility: Secondary | ICD-10-CM | POA: Diagnosis present

## 2020-03-02 DIAGNOSIS — R209 Unspecified disturbances of skin sensation: Secondary | ICD-10-CM | POA: Diagnosis present

## 2020-03-02 DIAGNOSIS — R296 Repeated falls: Secondary | ICD-10-CM | POA: Diagnosis present

## 2020-03-02 DIAGNOSIS — M6281 Muscle weakness (generalized): Secondary | ICD-10-CM | POA: Diagnosis present

## 2020-03-02 DIAGNOSIS — R2681 Unsteadiness on feet: Secondary | ICD-10-CM

## 2020-03-02 DIAGNOSIS — R42 Dizziness and giddiness: Secondary | ICD-10-CM | POA: Insufficient documentation

## 2020-03-02 NOTE — Therapy (Signed)
[facility] [address] [place], [name], [address] Phone: [phone]   Fax:  [phone]  Physical Therapy Treatment  Patient Details  Name: Tracey Perkins MRN: [id number] Date of Birth: 02/17/1985 Referring Provider (PT): Erin Sons, MD   Encounter Date: 03/02/2020   PT End of Session - 03/02/20 1159    Visit Number 27    Number of Visits 30    Date for PT Re-Evaluation 04/01/20    Authorization Type Have requested first three visits in 2022 - still under review    Authorization - Visit Number 1    Authorization - Number of Visits 3    PT Start Time 1105    PT Stop Time 1150    PT Time Calculation (min) 45 min    Activity Tolerance Patient tolerated treatment well    Behavior During Therapy [facility] for tasks assessed/performed           Past Medical History:  Diagnosis Date   Asthma    Brain tumor ([place])    Diabetes mellitus without complication ([place])    Eczema    Food allergy    Gastroparesis    Hypokalemia    Hypomagnesemia    Hypothyroid    Neuropathy    Post concussion syndrome 05/2019   Thyroid disease    Vision changes     Past Surgical History:  Procedure Laterality Date   BRAIN SURGERY     Portacath placement and removed     WISDOM TOOTH EXTRACTION      There were no vitals filed for this visit.   Subjective Assessment - 03/02/20 1108    Subjective Has been doing exercises and went to an arthritis exercises class with grandparents; did repeated head turns and had a severe episode of dizziness.  Feels like she is getting stronger; is now able to get out of bathtub without assistance and without getting on the knees; is using grab bars.  Is walking further distances and continues to use the treadmill.  Had a recent fall outside - was on uneven surface.  Is planning on joining a fitness club; it does have a pool.    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    Currently in Pain? No/denies                   Vestibular Assessment - 03/02/20 1135      Balancemaster   Balancemaster Comment MCTSIB: 30 seconds for condition 1 and 2.  Still unable to perfrom condition 3/4; unable to release therapist's UE.  Significant LOB.                    [facility] PT Treatment/Exercise - 03/02/20 1145      Exercises   Exercises Ankle      Ankle Exercises: Stretches   Gastroc Stretch 1 rep;60 seconds    Gastroc Stretch Limitations seated with belt, 60 seconds R and L foot.               Balance Exercises - 03/02/20 1137      Balance  Exercises: Standing   Standing Eyes Opened Wide (BOA);Foam/compliant surface;Other reps (comment);Limitations    Standing Eyes Opened Limitations feet wide on foam; lateral weight shifting and anterior/posterior weight shifting x 10 reps with UE support             PT Education - 03/02/20 1158    Education Details aquatic therapy information, updated HEP to include balance on compliant surface    Person(s) Educated Patient;Parent(s)    Methods Explanation;Handout;Demonstration    Comprehension Verbalized understanding;Returned demonstration            PT Short Term Goals - 01/06/20 2017      PT SHORT TERM GOAL #1   Title = LTG             PT Long Term Goals - 03/02/20 1206      PT LONG TERM GOAL #1   Title Patient will demonstrate ability to perform final HEP independently, walk on treadmill at home with supervision and return to the gym for ongoing  wellness    Baseline is performing HEP but not performing treadmill consistently    Time 4    Period Weeks    Status Revised    Target Date 03/16/20      PT LONG TERM GOAL #2   Title Pt will improve DGI without AD to >/= 22/24 points to indicate decreased falls risk    Baseline 20/24    Time 4    Period Weeks    Status Revised    Target Date 03/16/20      PT LONG TERM GOAL #3   Title Pt will demonstrate ability to perform MCTSIB condition 3 for 15 seconds and condition 4 for up to 10 seconds to indicate improved sensory integration    Baseline Conditions 1/2: 30 seconds, Conditions 3/4: unable    Time 4    Period Weeks    Status Revised    Target Date 03/16/20      PT LONG TERM GOAL #4   Title Pt will report no dizziness with quick head/body turns, bending down to the floor in order to decrease falls risk with ADL and mobility    Baseline No headaches; mild dizziness or disorientation with bending down to ground and turning quickly    Time 4    Period Weeks    Status Revised    Target Date 03/16/20      PT LONG TERM GOAL #5   Title Pt will be able to sit down in tub and stand back up with supervision without LOB to increase independence with ADL's    Baseline Can perform stand up shower in small shower with supervision; stood up from tub with water shoes on with 25% assistance    Time 4    Period Weeks    Status Achieved                 Plan - 03/02/20 1201    Clinical Impression Statement Pt returns to therapy after two months on hold due to out of town. Pt has continued to perform exercises and is now able to stand from bath tub with grab bars without assistance.  Pt continues to experience falls especially on compliant surfaces.  Re-assessed MCTSIB and added limits of stability on compliant mat to HEP.  Also plan to add aquatic therapy sessions to allow pt to be set up with aquatic HEP for community wellness.    Comorbidities asthma, eczema, food allergy,  polyneuropathy, uncontrolled  type 1 diabetes, gastroparesis, h/o chronic L thalamic infarct, new R thalamic infarct since 2016, lymphocytic thyroiditis, s/p radiation therapy, chemo and 2 surgical resection for medulloblastoma, childhood, nystagmus, visual field defect, sensorineural hearing loss of bilat ears    Rehab Potential Good    PT Frequency 1x / week    PT Duration 4 weeks    PT Treatment/Interventions ADLs/Self Care Home Management;Aquatic Therapy;Canalith Repostioning;DME Instruction;Gait training;Stair training;Functional mobility training;Patient/family education;Therapeutic activities;Orthotic Fit/Training;Therapeutic exercise;Balance training;Neuromuscular re-education;Vestibular    PT Next Visit Plan Did aquatic get set up?  Picking up objects from floor.  Balance on compliant surfaces and with vision removed.  Walking outside with walking stick?  Descending stairs. Continue to progress treadmill training; higher level balance and vestibular training.    Consulted and Agree with Plan of Care Patient;Family member/caregiver    Family Member Consulted Mother           Patient will benefit from skilled therapeutic intervention in order to improve the following deficits and impairments:  Abnormal gait,Decreased coordination,Difficulty walking,Dizziness,Decreased activity tolerance,Decreased balance,Decreased strength,Impaired sensation,Pain  Visit Diagnosis: Other abnormalities of gait and mobility  Muscle weakness (generalized)  Unsteadiness on feet  Repeated falls  Dizziness and giddiness  Unspecified disturbances of skin sensation     Problem List Patient Active Problem List   Diagnosis Date Noted   Impulsiveness 10/14/2019   Mild episode of recurrent major depressive disorder ([place]) 09/04/2019   Anxiety 08/16/2019   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   Status post radiation therapy 11/22/2018   Seasonal and  perennial allergic rhinitis 11/22/2018   Seasonal allergic conjunctivitis 11/22/2018   Acute sinusitis 11/22/2018   Keratosis pilaris 10/05/2017   Melanocytic nevi of trunk 10/05/2017   Adverse food reaction 02/09/2017   Gastroesophageal reflux disease 02/09/2017   Mild intermittent asthma with acute exacerbation 11/19/2015   Mild persistent asthma without complication 69/45/0388   Acute seasonal allergic rhinitis due to pollen 11/16/2015   Anaphylactic shock due to adverse food reaction 11/16/2015   Medulloblastoma ([place]) 11/16/2015   Type 1 diabetes mellitus without complication ([place]) 82/80/0349   Dry mouth 11/16/2015   Xerosis cutis 11/16/2015   Nystagmus 08/15/2014   Visual field defect 08/15/2014   Disequilibrium 07/25/2014   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   Rico Junker, PT, DPT 03/02/20    12:09 PM    [facility] [address] [place], [name], [address] Phone: [phone]   Fax:  [phone]  Name: Tracey Perkins MRN: [id number] Date of Birth: 04/07/1985

## 2020-03-02 NOTE — Patient Instructions (Addendum)
Aquatic Therapy: What to Expect!  Where:  [facility] [address] [place], [place] [address] [phone]           How to Prepare: . Please make sure you drink 8 ounces of water about one hour prior to your pool session . A caregiver must attend the entire session with the patient (unless your primary therapists feels this is not necessary). The caregiver will be responsible for assisting with dressing as well as any toileting needs.  . Please arrive IN YOUR SUIT and a few minutes prior to your appointment - this helps to avoid delays in starting your session. . Please make sure to attend to any toileting needs prior to entering the pool . Once on the pool deck your therapist will ask you to sign the Patient  Consent and Assignment of Benefits form . Your therapist may take your blood pressure prior to, during and after your session if indicated . We usually try and create a home exercise program based on activities we do in the pool.  Please be thinking about who might be able to assist you in the pool should you want to participate in an aquatic home exercise program at the time of discharge.  Some patients do not want to or do not have the ability to participate in an aquatic home program - this is not a barrier in any way to you participating in aquatic therapy as part of your current therapy plan!    About the pool: 1. Entering the pool Your therapist will assist you; there are multiple ways to enter including stairs with railings, a walk in ramp, a roll in chair and a mechanical lift. Your therapist will determine the most appropriate way for you. 2. Water temperature is usually between 86-87 degrees 3. There may be other swimmers in the pool at the same time     Contact Info:             Appointments: [facility]         All sessions are 45 minutes   [address] [place] [address]            Please call the [facility] if   [place], [place]  [address]           you need to cancel or reschedule an appointment.  [phone]          TREADMILL: Is safe to walk on the treadmill with supervision with both hands holding on.  1.2 mph start at 8 minutes.  0 elevation.   Every week increase time by 2 minutes.  Work up to 20-30 minutes and then start to increase speed gradually.    (This is in place of walking outside if weather is bad)   Supine: Leg Stretch With Strap (Basic)    Lie on back with one knee bent, foot flat on floor. Hook strap around other foot. Straighten knee. Keep knee level with other knee. Hold __30_ seconds. Relax leg completely down to floor.  Repeat _2__ times per leg.  Do _2__ sessions per day.   Wall Squat    Feet shoulder width apart, __12__ inches in front of wall, lean against wall. Heels on floor bend hips and knees slightly like you are sitting in a tall chair. Hold __10__ seconds. Return to standing. Repeat __5__ times. Do __2__ sessions per day.   Gaze Stabilization: Standing Feet Apart  Feet shoulder width apart, keeping eyes on target on wall __3__ feet away, tilt head down 15-30 and move head side to side for _30___ seconds. Repeat while moving head up and down for __30__ seconds. Do ___2_ sessions per day.   Feet Together, Head Motion - Eyes Open    Chair behind you and chair or counter in front for support/safety.  With eyes open, feet together, move head slowly: up and down 5 repetitions, Perform head turns to the left and right 5 repetitions.. Repeat _2_ times per session.   Weight shifting standing on cushion - Modified    Stand on a cushion with feet apart - hold on to a counter or back of a chair.  With feet wide, shift your weight over to the left leg by moving hips over left foot.  Hold for a few seconds and then shift hips over right foot so the weight is felt through the right foot.  Perform 10 times side to side.      With feet apart on cushion, keep knees straight and shift hips forward so the weight is over your toes.  Hold for a few seconds then shift your hips slightly backwards so the weight is over your heels.  Hold for a few seconds.  Repeat 10 times.   Gastroc, Sitting (Passive)    Sit with strap or towel around ball of foot. Gently pull toward body. Hold __60_ seconds.  Repeat _2__ times per session.     Push-Up (Sitting)    With a __4__ inch book under each hand, press down while lifting body - keep shoulders down. Use foot to help with balance but don't push up with legs. Hold __3-4__ seconds. Repeat ___5_ times. Do __2__ sessions per day.   EXTENSION: Standing - Resistance Band: Stable (Active)    Stand, right arm at side. Against red resistance band, draw arm backward, as far as possible, keeping elbow straight. Complete _2__ sets of __10_ repetitions.

## 2020-03-05 ENCOUNTER — Other Ambulatory Visit: Payer: Self-pay

## 2020-03-05 ENCOUNTER — Ambulatory Visit (INDEPENDENT_AMBULATORY_CARE_PROVIDER_SITE_OTHER): Payer: Medicaid Other | Admitting: Clinical

## 2020-03-05 DIAGNOSIS — F330 Major depressive disorder, recurrent, mild: Secondary | ICD-10-CM

## 2020-03-06 NOTE — Progress Notes (Signed)
   THERAPIST PROGRESS NOTE Virtual Visit via Telephone Note  I connected with Tracey Perkins on 03/05/2020 at 10:00 AM EST by telephone and verified that I am speaking with the correct person using two identifiers.  Location: Patient: home Provider: office   I discussed the limitations, risks, security and privacy concerns of performing an evaluation and management service by telephone and the availability of in person appointments. I also discussed with the patient that there may be a patient responsible charge related to this service. The patient expressed understanding and agreed to proceed.   Follow Up Instructions: I discussed the assessment and treatment plan with the patient. The patient was provided an opportunity to ask questions and all were answered. The patient agreed with the plan and demonstrated an understanding of the instructions.   The patient was advised to call back or seek an in-person evaluation if the symptoms worsen or if the condition fails to improve as anticipated.  Session Time: 25 minutes  Participation Level: Active  Behavioral Response: CasualAlertEuthymic  Type of Therapy: Individual Therapy  Treatment Goals addressed: Diagnosis: Depression  Interventions: CBT  Summary:  Tracey Perkins is a 35 y.o. female who presents for the scheduled session oriented times five and friendly. Client denied hallucinations and delusions. Client reported on today doing well. Client reported since the last session she feels that she is coming out of a depressive episode and feeling more hopeful. Client reported she did not remember telling the therapist about her event planning for her deceased friends fundraiser but states things have been working out well for it coming up in May this year. Client reported she had anxiety recently because her parents left for out of town and she stayed with her grandparents. Client reported she didn't realize how dependent she had become  on her parents to help her daily. Client reported she has been told that she is quicker to argue now but does not see that. Client reported that she got into an argument with her grandparents over a board game which could have been a simple fix. Client stated she usually runs off to her room and cries. Client stated, "since the concussion it takes me longer to realize things". Client reported otherwise she did experience progress that she has been able to lift herself out of the tub by herself this past week.    Suicidal/Homicidal: Nowithout intent/plan  Therapist Response:  Therapist began the session asking the client how she has been doing since last seen. Therapist actively listened to the clients thoughts and feelings. Therapist assisted the client with recall of information by detailing her stressors from last session to discuss her progress of using a schedule to have a cut off time with managing her activities and self care. Therapist used CBT to discuss using "clarification" in her communication to help with increasing both parties of understanding in a situation to prevent arguments as homework. Client was scheduled for next appointment.    Plan: Return again in 3 weeks for individual therapy.  Diagnosis: Mild episode of recurrent major depressive disorder  Nguyen Butler Y Haralambos Yeatts, LCSW 03/05/2020

## 2020-03-12 ENCOUNTER — Ambulatory Visit: Payer: Medicaid Other | Admitting: Physical Therapy

## 2020-03-12 ENCOUNTER — Encounter: Payer: Self-pay | Admitting: Physical Therapy

## 2020-03-12 ENCOUNTER — Other Ambulatory Visit: Payer: Self-pay

## 2020-03-12 DIAGNOSIS — R296 Repeated falls: Secondary | ICD-10-CM

## 2020-03-12 DIAGNOSIS — M6281 Muscle weakness (generalized): Secondary | ICD-10-CM

## 2020-03-12 DIAGNOSIS — R2689 Other abnormalities of gait and mobility: Secondary | ICD-10-CM | POA: Diagnosis not present

## 2020-03-12 DIAGNOSIS — R42 Dizziness and giddiness: Secondary | ICD-10-CM

## 2020-03-12 DIAGNOSIS — R2681 Unsteadiness on feet: Secondary | ICD-10-CM

## 2020-03-12 DIAGNOSIS — R209 Unspecified disturbances of skin sensation: Secondary | ICD-10-CM

## 2020-03-12 NOTE — Therapy (Signed)
[facility] [address] [place], [name], [address] Phone: [phone]   Fax:  [phone]  Physical Therapy Treatment  Patient Details  Name: Tracey Perkins MRN: [id number] Date of Birth: 03/29/1985 Referring Provider (PT): Erin Sons, MD   Encounter Date: 03/12/2020   PT End of Session - 03/12/20 1824    Visit Number 28    Number of Visits 30    Date for PT Re-Evaluation 04/01/20    Authorization Type Have requested first three visits in 2022 - still under review    Authorization - Visit Number 2    Authorization - Number of Visits 3    PT Start Time 0940    PT Stop Time 1020    PT Time Calculation (min) 40 min    Activity Tolerance Patient tolerated treatment well    Behavior During Therapy Anxious           Past Medical History:  Diagnosis Date  . Asthma   . Brain tumor ([place])   . Diabetes mellitus without complication ([place])   . Eczema   . Food allergy   . Gastroparesis   . Hypokalemia   . Hypomagnesemia   . Hypothyroid   . Neuropathy   . Post concussion syndrome 05/2019  . Thyroid disease   . Vision changes     Past Surgical History:  Procedure Laterality Date  . BRAIN SURGERY    . Portacath placement and removed    . WISDOM TOOTH EXTRACTION      There were no vitals filed for this visit.   Subjective Assessment - 03/12/20 0951    Subjective Had some dental work done; face/neck are sore.  Was very sore after last session.  Was able to get one aquatic session set up.  Dizziness when lying back in dental chair.    Patient is accompained by: Family member   Mother, [name]   Pertinent History asthma, eczema, food allergy, polyneuropathy, uncontrolled type 1 diabetes, gastroparesis, h/o chronic L thalamic infarct, new R thalamic infarct since 2016, lymphocytic thyroiditis, s/p radiation therapy, chemo and 2 surgical resection for medulloblastoma, childhood, nystagmus, visual field  defect, sensorineural hearing loss of bilat ears    Patient Stated Goals Improving balance and decreasing falls.    Currently in Pain? Yes                              Vestibular Treatment/Exercise - 03/12/20 0953      Vestibular Treatment/Exercise   Vestibular Treatment Provided Gaze    Habituation Exercises --    Gaze Exercises X1 Viewing Horizontal;X1 Viewing Vertical      X1 Viewing Horizontal   Foot Position standing with feet apart    Reps 4    Comments 30 seconds with patch over one lens and then the other; 2 sets each side.  Educated pt on the need to focus gaze on one particular target (image on retina) for true VOR training      X1 Viewing Vertical   Foot Position standing with feet apart    Reps 4    Comments 30 seconds with patch over one lens and then the other; 2 sets each side.  Educated pt on the need to focus gaze on one particular target (image on retina) for true VOR training              Balance Exercises - 03/12/20 1023  Balance Exercises: Standing   Gait with Head Turns Forward;Retro;Upper extremity support;3 reps;Limitations    Gait with Head Turns Limitations finger on the wall, head turns L and R, head up and down x 3 laps each, supervision             PT Education - 03/12/20 1823    Education Details plan for next visit; add more land visits    Person(s) Educated Patient;Parent(s)    Methods Explanation    Comprehension Verbalized understanding            PT Short Term Goals - 01/06/20 2017      PT SHORT TERM GOAL #1   Title = LTG             PT Long Term Goals - 03/02/20 1206      PT LONG TERM GOAL #1   Title Patient will demonstrate ability to perform final HEP independently, walk on treadmill at home with supervision and return to the gym for ongoing wellness    Baseline is performing HEP but not performing treadmill consistently    Time 4    Period Weeks    Status Revised    Target Date 03/16/20       PT LONG TERM GOAL #2   Title Pt will improve DGI without AD to >/= 22/24 points to indicate decreased falls risk    Baseline 20/24    Time 4    Period Weeks    Status Revised    Target Date 03/16/20      PT LONG TERM GOAL #3   Title Pt will demonstrate ability to perform MCTSIB condition 3 for 15 seconds and condition 4 for up to 10 seconds to indicate improved sensory integration    Baseline Conditions 1/2: 30 seconds, Conditions 3/4: unable    Time 4    Period Weeks    Status Revised    Target Date 03/16/20      PT LONG TERM GOAL #4   Title Pt will report no dizziness with quick head/body turns, bending down to the floor in order to decrease falls risk with ADL and mobility    Baseline No headaches; mild dizziness or disorientation with bending down to ground and turning quickly    Time 4    Period Weeks    Status Revised    Target Date 03/16/20      PT LONG TERM GOAL #5   Title Pt will be able to sit down in tub and stand back up with supervision without LOB to increase independence with ADL's    Baseline Can perform stand up shower in small shower with supervision; stood up from tub with water shoes on with 25% assistance    Time 4    Period Weeks    Status Achieved                 Plan - 03/12/20 1825    Clinical Impression Statement Pt reports she continues to have difficulty with balance in busy environments with increased visual stimuli.  Reviewed x1 viewing with patch; not able to progress it today due to pt has been performing at home without using a specific target.  Advised pt to place small target on wall for true gaze stability training.  Initiated training for walking with head turns with pt placing one hand on wall for balance; pt experienced mild LOB walking backwards and required intermittent standing rest breaks to allow dizziness to settle.  Will continue to address vestibular and balance deficits.  Pt set up for aquatic in 2 weeks.    Comorbidities  asthma, eczema, food allergy, polyneuropathy, uncontrolled type 1 diabetes, gastroparesis, h/o chronic L thalamic infarct, new R thalamic infarct since 2016, lymphocytic thyroiditis, s/p radiation therapy, chemo and 2 surgical resection for medulloblastoma, childhood, nystagmus, visual field defect, sensorineural hearing loss of bilat ears    Rehab Potential Good    PT Frequency 1x / week    PT Duration 4 weeks    PT Treatment/Interventions ADLs/Self Care Home Management;Aquatic Therapy;Canalith Repostioning;DME Instruction;Gait training;Stair training;Functional mobility training;Patient/family education;Therapeutic activities;Orthotic Fit/Training;Therapeutic exercise;Balance training;Neuromuscular re-education;Vestibular    PT Next Visit Plan Check goals, recert and request more visits from [facility].  Aquatic on 3/7.  Picking up objects from floor.  Balance on compliant surfaces and with vision removed.  Walking outside with walking stick?  Descending stairs. Continue to progress treadmill training; higher level balance and vestibular training.    Consulted and Agree with Plan of Care Patient;Family member/caregiver    Family Member Consulted Mother           Patient will benefit from skilled therapeutic intervention in order to improve the following deficits and impairments:  Abnormal gait,Decreased coordination,Difficulty walking,Dizziness,Decreased activity tolerance,Decreased balance,Decreased strength,Impaired sensation,Pain  Visit Diagnosis: Other abnormalities of gait and mobility  Muscle weakness (generalized)  Unsteadiness on feet  Repeated falls  Dizziness and giddiness  Unspecified disturbances of skin sensation     Problem List Patient Active Problem List   Diagnosis Date Noted  . Impulsiveness 10/14/2019  . Mild episode of recurrent major depressive disorder ([place]) 09/04/2019  . Anxiety 08/16/2019  . 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  . 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 Status post radiation therapy 11/22/2018  . Seasonal and perennial allergic rhinitis 11/22/2018  . Seasonal allergic conjunctivitis 11/22/2018  . Acute sinusitis 11/22/2018  . Keratosis pilaris 10/05/2017  . Melanocytic nevi of trunk 10/05/2017  . Adverse food reaction 02/09/2017  . Gastroesophageal reflux disease 02/09/2017  . Mild intermittent asthma with acute exacerbation 11/19/2015  . Mild persistent asthma without complication 56/21/3086  . Acute seasonal allergic rhinitis due to pollen 11/16/2015  . Anaphylactic shock due to adverse food reaction 11/16/2015  . 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 Type 1 diabetes mellitus without complication ([place]) 57/84/6962  . Dry mouth 11/16/2015  . Xerosis cutis 11/16/2015  . Nystagmus 08/15/2014  . Visual field defect 08/15/2014  . Disequilibrium 07/25/2014  . 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    Rico Junker, PT, DPT 03/12/20    6:32 PM    [facility] [address] [place], [name], [address] Phone: [phone]   Fax:  [phone]  Name: SHEKINAH PITONES MRN: [id number] Date of Birth: [birth year]

## 2020-03-16 ENCOUNTER — Encounter: Payer: Self-pay | Admitting: Physical Therapy

## 2020-03-16 ENCOUNTER — Other Ambulatory Visit: Payer: Self-pay

## 2020-03-16 ENCOUNTER — Ambulatory Visit: Payer: Medicaid Other | Admitting: Physical Therapy

## 2020-03-16 DIAGNOSIS — R296 Repeated falls: Secondary | ICD-10-CM

## 2020-03-16 DIAGNOSIS — R42 Dizziness and giddiness: Secondary | ICD-10-CM

## 2020-03-16 DIAGNOSIS — M6281 Muscle weakness (generalized): Secondary | ICD-10-CM

## 2020-03-16 DIAGNOSIS — R2681 Unsteadiness on feet: Secondary | ICD-10-CM

## 2020-03-16 DIAGNOSIS — R209 Unspecified disturbances of skin sensation: Secondary | ICD-10-CM

## 2020-03-16 DIAGNOSIS — R2689 Other abnormalities of gait and mobility: Secondary | ICD-10-CM | POA: Diagnosis not present

## 2020-03-16 NOTE — Therapy (Signed)
[facility] [address] [place], [name], [address] Phone: [phone]   Fax:  [phone]  Physical Therapy Treatment  Patient Details  Name: Tracey Perkins MRN: [id number] Date of Birth: 1985-05-17 Referring Provider (PT): Erin Sons, MD   Encounter Date: 03/16/2020   PT End of Session - 03/16/20 1201    Visit Number 29    Number of Visits 30    Date for PT Re-Evaluation 04/01/20    Authorization Type Have requested first three visits in 2022 - still under review    Authorization - Visit Number 3    Authorization - Number of Visits 3    PT Start Time 1104    PT Stop Time 1150    PT Time Calculation (min) 46 min    Activity Tolerance Patient tolerated treatment well    Behavior During Therapy Anxious           Past Medical History:  Diagnosis Date  . Asthma   . Brain tumor ([place])   . Diabetes mellitus without complication ([place])   . Eczema   . Food allergy   . Gastroparesis   . Hypokalemia   . Hypomagnesemia   . Hypothyroid   . Neuropathy   . Post concussion syndrome 05/2019  . Thyroid disease   . Vision changes     Past Surgical History:  Procedure Laterality Date  . BRAIN SURGERY    . Portacath placement and removed    . WISDOM TOOTH EXTRACTION      There were no vitals filed for this visit.   Subjective Assessment - 03/16/20 1108    Subjective Face/neck are feeling better, still having sensitivity.  Worked outside with dad, raking up sticks.  Wasn't able to do the x1 viewing exercise this weekend due to still not feeling very balanced.    Patient is accompained by: Family member   Mother, [name]   Pertinent History asthma, eczema, food allergy, polyneuropathy, uncontrolled type 1 diabetes, gastroparesis, h/o chronic L thalamic infarct, new R thalamic infarct since 2016, lymphocytic thyroiditis, s/p radiation therapy, chemo and 2 surgical resection for medulloblastoma, childhood,  nystagmus, visual field defect, sensorineural hearing loss of bilat ears    Patient Stated Goals Improving balance and decreasing falls.    Currently in Pain? Yes              [facility] PT Assessment - 03/16/20 1116      Assessment   Medical Diagnosis Disequilibrium, Traumatic injury of head, multiple falls, gait abnormality    Referring Provider (PT) Erin Sons, MD    Onset Date/Surgical Date 06/07/19    Hand Dominance Right    Prior Therapy yes - acute and inpatient rehab at [facility]      Precautions   Precautions Other (comment)    Precaution Comments asthma, eczema, food allergy, polyneuropathy, uncontrolled type 1 diabetes, gastroparesis, h/o chronic L thalamic infarct, new R thalamic infarct since 2016, lymphocytic thyroiditis, s/p radiation therapy, chemo and 2 surgical resection for medulloblastoma, childhood, nystagmus, visual field defect, sensorineural hearing loss of bilat ears      Prior Function   Level of Independence Independent    Vocation Student    Vocation Requirements Is in the [employer] program    Leisure going to the gym      Observation/Other Assessments   Focus on Therapeutic Outcomes (FOTO)  Not applicable - Medicaid      Dynamic Gait Index   Level Surface Mild Impairment  Change in Gait Speed Mild Impairment    Gait with Horizontal Head Turns Moderate Impairment    Gait with Vertical Head Turns Normal    Gait and Pivot Turn Normal    Step Over Obstacle Normal    Step Around Obstacles Normal    Steps Mild Impairment    Total Score 19    DGI comment: 19/24; reports having more visual disturbance today               Vestibular Assessment - 03/16/20 1129      Balancemaster   Balancemaster Comment MCTSIB: 30 seconds condition 1 and 2; 5 seconds condition 3 after 3 trials.  Unable to perform condition 4      Positional Sensitivities   Sit to Supine Mild dizziness    Supine to Sitting No dizziness    Nose to Right  Knee No dizziness    Right Knee to Sitting No dizziness    Nose to Left Knee No dizziness    Left Knee to Sitting No dizziness    Head Turning x 5 No dizziness    Head Nodding x 5 Mild dizziness    Pivot Right in Standing No dizziness    Pivot Left in Standing No dizziness    Rolling Right Moderate dizziness    Rolling Left No dizziness    Positional Sensitivities Comments R aural fullness                            PT Education - 03/16/20 1201    Education Details clinical findings, plan for recertification and to start aquatic therapy next Monday    Person(s) Educated Patient;Parent(s)    Methods Explanation    Comprehension Verbalized understanding            PT Short Term Goals - 01/06/20 2017      PT SHORT TERM GOAL #1   Title = LTG             PT Long Term Goals - 03/16/20 1114      PT LONG TERM GOAL #1   Title Patient will demonstrate ability to perform final HEP independently, walk on treadmill at home with supervision and return to the gym for ongoing wellness    Baseline is performing HEP and treadmill; has joined a gym but not going consistently    Time 4    Period Weeks    Status Partially Met      PT LONG TERM GOAL #2   Title Pt will improve DGI without AD to >/= 22/24 points to indicate decreased falls risk    Baseline 20/24 > 19/24    Time 4    Period Weeks    Status Not Met      PT LONG TERM GOAL #3   Title Pt will demonstrate ability to perform MCTSIB condition 3 for 15 seconds and condition 4 for up to 10 seconds to indicate improved sensory integration    Baseline Condition 3: 5 seconds; Condition 4: unable    Time 4    Period Weeks    Status Not Met      PT LONG TERM GOAL #4   Title Pt will report no dizziness with quick head/body turns, bending down to the floor in order to decrease falls risk with ADL and mobility    Baseline dizziness with vertical head movements, sit > supine and rolling to R but pt reporting aural  fullness and issues with allergies    Time 4    Period Weeks    Status Partially Met      PT LONG TERM GOAL #5   Title Pt will be able to sit down in tub and stand back up with supervision without LOB to increase independence with ADL's    Baseline Can perform stand up shower in small shower with supervision; stood up from tub with water shoes on with 25% assistance    Time 4    Period Weeks    Status Achieved           New goals for recertification:  PT Long Term Goals - 03/16/20 1223      PT LONG TERM GOAL #1   Title Patient will demonstrate ability to perform final land and aquatic HEP independently at home and at gym for ongoing wellness.    Baseline is performing HEP and treadmill; has joined a gym but not going consistently    Time 8    Period Weeks    Status Revised    Target Date 05/15/20      PT LONG TERM GOAL #2   Title Pt will improve DGI without AD to >/= 22/24 points to indicate decreased falls risk    Baseline 19/24    Time 8    Period Weeks    Status Revised    Target Date 05/15/20      PT LONG TERM GOAL #3   Title Pt will demonstrate ability to perform MCTSIB condition 3 for 15 seconds and condition 4 for up to 10 seconds to indicate improved sensory integration    Baseline Condition 3: 5 seconds; Condition 4: unable    Time 8    Period Weeks    Status Revised    Target Date 05/15/20      PT LONG TERM GOAL #4   Title Pt will report no dizziness with vertical head movements, bending down to the floor, supine <> sit and rolling in bed in order to decrease falls risk with ADL and mobility    Baseline dizziness with vertical head movements, sit > supine and rolling to R but pt reporting aural fullness and issues with allergies    Time 8    Period Weeks    Status Revised    Target Date 05/15/20                Plan - 03/16/20 1203    Clinical Impression Statement Performed assessment of progress towards LTG over the past month.  Pt is making slow  but steady progress.  She has met her goal of being able to transfer down into and out of the tub via sitting independently with use of grab bars; pt no longer requires assistance from father to perform safely.  Pt has partially met HEP goal as she is performing 90% of her HEP and walking on the treadmill; pt has joined a gym that has a pool but pt has not started going yet.  Pt will start aquatic therapy on Monday to get started with learning an aquatic HEP.  Pt is not performing vestibular exercises as consistently due to other medical issues over the past few weeks.  Pt partially met motion sensitivity goal as she is no longer experiencing dizziness with horizontal head turns or reaching down to the floor but is reporting increased dizziness today with sit > supine, rolling to the R and continues to have dizziness with vertical head  nods.  Pt did not meet MCTSIB goal and continues to have significant difficulty with maintaining balance on compliant surfaces and with vision removed.  Pt did not meet DGI goal and continues to demonstrate impaired balance with head turns for scanning environment and changing gait speeds.  Pt continues to be at increased falls risk and would benefit from continued skilled PT services to address these ongoing balance and vestibular impairments and to initiate gym/aquatic HEP for successful transition to community wellness program.    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, childhood, nystagmus, visual field defect, sensorineural hearing loss of bilat ears    Rehab Potential Good    PT Frequency Other (comment)   2x/week x 4 (land and aquatic); + 1x/week x 4 (land only)   PT Duration Other (comment)    PT Treatment/Interventions ADLs/Self Care Home  Management;Aquatic Therapy;Canalith Repostioning;DME Instruction;Gait training;Stair training;Functional mobility training;Patient/family education;Therapeutic activities;Orthotic Fit/Training;Therapeutic exercise;Balance training;Neuromuscular re-education;Vestibular    PT Next Visit Plan Aquatic on 3/7 - pt is diabetic so please make sure she ate before; needs to work on coordination, rotation.  Land: habituation to vertical head movements, supine <> sit, rolling R.  Picking up objects from floor.  Balance on compliant surfaces and with vision removed.  Walking outside with walking stick?  Descending stairs. Continue to progress treadmill training; higher level balance and vestibular training.    Consulted and Agree with Plan of Care Patient;Family member/caregiver    Family Member Consulted Mother           Patient will benefit from skilled therapeutic intervention in order to improve the following deficits and impairments:  Abnormal gait,Decreased coordination,Difficulty walking,Dizziness,Decreased activity tolerance,Decreased balance,Decreased strength,Impaired sensation,Pain  Visit Diagnosis: Other abnormalities of gait and mobility  Muscle weakness (generalized)  Unsteadiness on feet  Repeated falls  Dizziness and giddiness  Unspecified disturbances of skin sensation     Problem List Patient Active Problem List   Diagnosis Date Noted  . Impulsiveness 10/14/2019  . Mild episode of recurrent major depressive disorder ([place]) 09/04/2019  . Anxiety 08/16/2019  . 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  . 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 Status post radiation therapy 11/22/2018  . Seasonal and perennial allergic rhinitis 11/22/2018  . Seasonal allergic conjunctivitis 11/22/2018  . Acute sinusitis 11/22/2018  . Keratosis pilaris 10/05/2017  . Melanocytic nevi of trunk 10/05/2017  . Adverse food reaction 02/09/2017  . Gastroesophageal reflux disease 02/09/2017  . Mild  intermittent asthma with acute exacerbation 11/19/2015  . Mild persistent asthma without complication 74/73/4037  . Acute seasonal allergic rhinitis due to pollen 11/16/2015  . Anaphylactic shock due to adverse food reaction 11/16/2015  . 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 Type 1 diabetes mellitus without complication ([place]) 09/64/3838  . Dry mouth 11/16/2015  . Xerosis cutis 11/16/2015  . Nystagmus 08/15/2014  . Visual field defect 08/15/2014  . Disequilibrium 07/25/2014  . 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     Rico Junker, PT, DPT 03/16/20    12:22 PM    [facility] [address] [place], [name], [address] Phone: [phone]   Fax:  [phone]  Name: Tracey Perkins MRN: [id number] Date of Birth: Mar 07, 1985    Managed medicaid CPT codes: [phone]- Therapeutic Exercise, [phone]- Neuro Re-education, [phone] - Gait Training, [phone] - Therapeutic Activities,  [place], [phone] - Aquatic therapy and [phone] - Canalith Repositioning

## 2020-03-23 ENCOUNTER — Ambulatory Visit: Payer: Medicaid Other | Attending: Neurology | Admitting: Physical Therapy

## 2020-03-23 ENCOUNTER — Other Ambulatory Visit: Payer: Self-pay

## 2020-03-23 DIAGNOSIS — M6281 Muscle weakness (generalized): Secondary | ICD-10-CM | POA: Insufficient documentation

## 2020-03-23 DIAGNOSIS — R2681 Unsteadiness on feet: Secondary | ICD-10-CM | POA: Insufficient documentation

## 2020-03-23 DIAGNOSIS — R2689 Other abnormalities of gait and mobility: Secondary | ICD-10-CM | POA: Insufficient documentation

## 2020-03-24 ENCOUNTER — Encounter: Payer: Self-pay | Admitting: Physical Therapy

## 2020-03-24 ENCOUNTER — Ambulatory Visit: Payer: Medicaid Other | Admitting: Physical Therapy

## 2020-03-24 DIAGNOSIS — R2689 Other abnormalities of gait and mobility: Secondary | ICD-10-CM | POA: Diagnosis not present

## 2020-03-24 DIAGNOSIS — R2681 Unsteadiness on feet: Secondary | ICD-10-CM

## 2020-03-24 DIAGNOSIS — M6281 Muscle weakness (generalized): Secondary | ICD-10-CM

## 2020-03-24 NOTE — Therapy (Signed)
[name], [name], [address] Phone: [phone]   Fax:  [phone]  Physical Therapy Treatment  Patient Details  Name: Tracey Perkins MRN: [id number] Date of Birth: [birth year] Referring Provider (PT): Erin Sons, MD   Encounter Date: 03/23/2020   PT End of Session - 03/24/20 1037    Visit Number 30    Number of Visits 41    Authorization Type Requested 3; requested 12 more;03/23/20-per front office staff still awaiting approval of additional visits but instructed to continue with PT sessions.    PT Start Time 1145    PT Stop Time 1230    PT Time Calculation (min) 45 min    Equipment Utilized During Treatment Other (comment)   ankle cuffs   Behavior During Therapy WFL for tasks assessed/performed           Past Medical History:  Diagnosis Date  . Asthma   . Brain tumor ([place])   . Diabetes mellitus without complication ([place])   . Eczema   . Food allergy   . Gastroparesis   . Hypokalemia   . Hypomagnesemia   . Hypothyroid   . Neuropathy   . Post concussion syndrome 05/2019  . Thyroid disease   . Vision changes     Past Surgical History:  Procedure Laterality Date  . BRAIN SURGERY    . Portacath placement and removed    . WISDOM TOOTH EXTRACTION      There were no vitals filed for this visit.   Subjective Assessment - 03/24/20 1035    Subjective Having a little bit of nystagmus today per pt report and would like to hold on any head/eye movements today.    Patient is accompained by: Family member   Mother, Tracey Perkins   Pertinent History asthma, eczema, food allergy, polyneuropathy, uncontrolled type 1 diabetes, gastroparesis, h/o chronic L thalamic infarct, new R thalamic infarct since 2016, lymphocytic thyroiditis, s/p radiation therapy, chemo and 2 surgical resection for medulloblastoma, childhood, nystagmus, visual field defect, sensorineural hearing loss of bilat ears     Patient Stated Goals Improving balance and decreasing falls.    Currently in Pain? No/denies           Aquatic therapy at [facility]   Patient seen for aquatic therapy today. Treatment took place in water 3.6-4.75feet deep depending upon activity. Pt entered and exited the pool viastepnegotiation with use of bil. Hand rails for support.; 6 steps - approx. 4" height  Pt performed bil. Hamstrings/heel cord stretch (runner's stretch) - 1 rep each leg for 30 sec hold  Gait training focused on initial gait withforward walking without UE supportin 3.6-4.0 ft of water. Progressed tobackward walking, side stepping.Performed water walking above both horizontal and vertical in pool.   Pt sat on bench in pool - LAQ's 15each leg ; seated hip flexion 15 repseach leg;ankle dorsiflexion/plantar flexion with knee extended.  sit to stand from bench with intermittent UE support with CGAx 10 reps. Placed ankle cuffs and performed LAQ and hip flexion again x 10 reps  Pt performed standingheel raises,hip flexion, abduction,hamstring curland extension 10 reps each legwithankle buoyancy cuffs and intermittent UE support.  Performed forward step weight shifting x 10 reps each side.  Pt requires buoyancy of water for support and for joint off loading for reduced pain with weight bearing exercise; pt requires viscosity of water for resistance for strengthening; Gait training is able to be performed in the water with increasedtolerance  due to joint off loading and reduced fear of fall.  Pt able to perform activities in water that would not be safe on land due to fall risk.            PT Short Term Goals - 03/16/20 1223      PT SHORT TERM GOAL #1   Title = LTG             PT Long Term Goals - 03/16/20 1223      PT LONG TERM GOAL #1   Title Patient will demonstrate ability to perform final land and aquatic HEP independently at home and at gym for  ongoing wellness.    Baseline is performing HEP and treadmill; has joined a gym but not going consistently    Time 8    Period Weeks    Status Revised    Target Date 05/15/20      PT LONG TERM GOAL #2   Title Pt will improve DGI without AD to >/= 22/24 points to indicate decreased falls risk    Baseline 19/24    Time 8    Period Weeks    Status Revised    Target Date 05/15/20      PT LONG TERM GOAL #3   Title Pt will demonstrate ability to perform MCTSIB condition 3 for 15 seconds and condition 4 for up to 10 seconds to indicate improved sensory integration    Baseline Condition 3: 5 seconds; Condition 4: unable    Time 8    Period Weeks    Status Revised    Target Date 05/15/20      PT LONG TERM GOAL #4   Title Pt will report no dizziness with vertical head movements, bending down to the floor, supine <> sit and rolling in bed in order to decrease falls risk with ADL and mobility    Baseline dizziness with vertical head movements, sit > supine and rolling to R but pt reporting aural fullness and issues with allergies    Time 8    Period Weeks    Status Revised    Target Date 05/15/20                 Plan - 03/24/20 1038    Clinical Impression Statement Skilled aquatic session focused on gait, balance and strength.  Pt tolerated first session well without need for rest breaks.  Cont per poc.    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, childhood, nystagmus, visual field defect, sensorineural hearing loss of bilat ears    Rehab Potential Good    PT Frequency Other (comment)   2x/week x 4 (land and aquatic); + 1x/week x 4 (land only)   PT Duration Other (comment)    PT Treatment/Interventions ADLs/Self Care Home Management;Aquatic Therapy;Canalith  Repostioning;DME Instruction;Gait training;Stair training;Functional mobility training;Patient/family education;Therapeutic activities;Orthotic Fit/Training;Therapeutic exercise;Balance training;Neuromuscular re-education;Vestibular    PT Next Visit Plan Aquatic on 3/7 - pt is diabetic so please make sure she ate before; needs to work on coordination, rotation.  Land: habituation to vertical head movements, supine <> sit, rolling R.  Picking up objects from floor.  Balance on compliant surfaces and with vision removed.  Walking outside with walking stick?  Descending stairs. Continue to progress treadmill training; higher level balance and vestibular training.    Consulted and  Agree with Plan of Care Patient;Family member/caregiver    Family Member Consulted Mother           Patient will benefit from skilled therapeutic intervention in order to improve the following deficits and impairments:  Abnormal gait,Decreased coordination,Difficulty walking,Dizziness,Decreased activity tolerance,Decreased balance,Decreased strength,Impaired sensation,Pain  Visit Diagnosis: Other abnormalities of gait and mobility  Muscle weakness (generalized)  Unsteadiness on feet     Problem List Patient Active Problem List   Diagnosis Date Noted  . Impulsiveness 10/14/2019  . Mild episode of recurrent major depressive disorder ([place]) 09/04/2019  . Anxiety 08/16/2019  . 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  . 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 Status post radiation therapy 11/22/2018  . Seasonal and perennial allergic rhinitis 11/22/2018  . Seasonal allergic conjunctivitis 11/22/2018  . Acute sinusitis 11/22/2018  . Keratosis pilaris 10/05/2017  . Melanocytic nevi of trunk 10/05/2017  . Adverse food reaction 02/09/2017  . Gastroesophageal reflux disease 02/09/2017  . Mild intermittent asthma with acute exacerbation 11/19/2015  . Mild persistent asthma without complication 69/62/9528  . Acute  seasonal allergic rhinitis due to pollen 11/16/2015  . Anaphylactic shock due to adverse food reaction 11/16/2015  . 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 Type 1 diabetes mellitus without complication ([place]) 41/32/4401  . Dry mouth 11/16/2015  . Xerosis cutis 11/16/2015  . Nystagmus 08/15/2014  . Visual field defect 08/15/2014  . Disequilibrium 07/25/2014  . 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    Narda Bonds, [place] 03/24/20 10:43 AM Phone: [phone] Fax: [facility] [address] [place], [name], [address] Phone: [phone]   Fax:  [phone]  Name: Tracey Perkins MRN: [id number] Date of Birth: [birth year]

## 2020-03-24 NOTE — Therapy (Signed)
[facility] [address] [place], [name], [address] Phone: [phone]   Fax:  [phone]  Physical Therapy Treatment  Patient Details  Name: Tracey Perkins MRN: [id number] Date of Birth: Jun 10, 1985 Referring Provider (PT): Erin Sons, MD   Encounter Date: 03/24/2020   PT End of Session - 03/24/20 1653    Visit Number 31   visit 4 since eval   Number of Visits 41    Authorization Type Requested 3; requested 12 more;03/23/20, 03-24-20  -per front office staff still awaiting approval of additional visits but instructed to continue with PT sessions.    Authorization - Visit Number 4    Authorization - Number of Visits 12    PT Start Time 9983    PT Stop Time 1620    PT Time Calculation (min) 47 min    Equipment Utilized During Treatment Other (comment)   rollator   Activity Tolerance Patient tolerated treatment well    Behavior During Therapy WFL for tasks assessed/performed           Past Medical History:  Diagnosis Date  . Asthma   . Brain tumor ([place])   . Diabetes mellitus without complication ([place])   . Eczema   . Food allergy   . Gastroparesis   . Hypokalemia   . Hypomagnesemia   . Hypothyroid   . Neuropathy   . Post concussion syndrome 05/2019  . Thyroid disease   . Vision changes     Past Surgical History:  Procedure Laterality Date  . BRAIN SURGERY    . Portacath placement and removed    . WISDOM TOOTH EXTRACTION      There were no vitals filed for this visit.   Subjective Assessment - 03/24/20 1638    Subjective Pt accompanied to PT by her mother; pt states she is sore today after aquatic therapy session yesterday.  Pt reports she is using walking stick at home when she walks outside, but mother reports she doesn't feel she is stable enough to walk alone for long distances.  Pt reports neuropathy inher feet has gotten worse; mother states that when they go to a store she gets a cart for  pt to hold onto and this really helps her to keep her balance.    Patient is accompained by: Family member   Mother, [name]   Pertinent History asthma, eczema, food allergy, polyneuropathy, uncontrolled type 1 diabetes, gastroparesis, h/o chronic L thalamic infarct, new R thalamic infarct since 2016, lymphocytic thyroiditis, s/p radiation therapy, chemo and 2 surgical resection for medulloblastoma, childhood, nystagmus, visual field defect, sensorineural hearing loss of bilat ears    Patient Stated Goals Improving balance and decreasing falls.    Currently in Pain? Other (Comment)   reports neuropathy in her feet                            [facility] Adult PT Treatment/Exercise - 03/24/20 1646      Ambulation/Gait   Ambulation/Gait Yes    Ambulation/Gait Assistance 5: Supervision    Ambulation/Gait Assistance Details cues to stay close to rollator; cues to put brakes on when descending curb, to hold brakes when ascending curb    Ambulation Distance (Feet) 300 Feet    Assistive device Rollator    Gait Pattern Within Functional Limits    Ambulation Surface Level;Unlevel;Indoor;Outdoor;Paved    Curb 5: Supervision;Other (comment)   CGA - cues for brakes with  ascension and descension     Exercises   Exercises Ankle      Knee/Hip Exercises: Stretches   Active Hamstring Stretch Both;1 rep;30 seconds   Runner's stretch   Gastroc Stretch Right;Left;1 rep;30 seconds   3" block - in standing     Ankle Exercises: Standing   Heel Raises Right;Left;10 reps   10 reps unilateral leg - 20 reps total   Toe Raise 10 reps   in standing with UE support on rollator                 PT Education - 03/24/20 1651    Education Details HEP - toe raise, unilateral heel raise, gastroc and hamstring stretch (runner's stretch) for neuropathy in bil. LE's _[employer] [id number]    Person(s) Educated Patient;Parent(s)    Methods Explanation;Demonstration;Handout    Comprehension Verbalized  understanding;Returned demonstration            PT Short Term Goals - 03/16/20 1223      PT SHORT TERM GOAL #1   Title = LTG             PT Long Term Goals - 03/24/20 1711      PT LONG TERM GOAL #1   Title Patient will demonstrate ability to perform final land and aquatic HEP independently at home and at gym for ongoing wellness.    Baseline is performing HEP and treadmill; has joined a gym but not going consistently    Time 8    Period Weeks    Status Revised      PT LONG TERM GOAL #2   Title Pt will improve DGI without AD to >/= 22/24 points to indicate decreased falls risk    Baseline 19/24    Time 8    Period Weeks    Status Revised      PT LONG TERM GOAL #3   Title Pt will demonstrate ability to perform MCTSIB condition 3 for 15 seconds and condition 4 for up to 10 seconds to indicate improved sensory integration    Baseline Condition 3: 5 seconds; Condition 4: unable    Time 8    Period Weeks    Status Revised      PT LONG TERM GOAL #4   Title Pt will report no dizziness with vertical head movements, bending down to the floor, supine <> sit and rolling in bed in order to decrease falls risk with ADL and mobility    Baseline dizziness with vertical head movements, sit > supine and rolling to R but pt reporting aural fullness and issues with allergies    Time 8    Period Weeks    Status Revised                 Plan - 03/24/20 1655    Clinical Impression Statement Pt initially hesitant on using rollator for assistance with prolonged community ambulation, but with trial of this device outdoors she realized benefit of having increased stability with balance which increases safety with reduced fall risk and also benefit of having seat readily available when fatigued.  Pt did well controlling rollator on decline outdoors and was able to stay close to walker without increased trunk flexion.  Pt requests order for rollator from physician.    Comorbidities asthma,  eczema, food allergy, polyneuropathy, uncontrolled type 1 diabetes, gastroparesis, h/o chronic L thalamic infarct, new R thalamic infarct since 2016, lymphocytic thyroiditis, s/p radiation therapy, chemo and 2 surgical resection for medulloblastoma,  childhood, nystagmus, visual field defect, sensorineural hearing loss of bilat ears    Rehab Potential Good    PT Frequency Other (comment)   2x/week x 4 (land and aquatic); + 1x/week x 4 (land only)   PT Duration Other (comment)    PT Treatment/Interventions ADLs/Self Care Home Management;Aquatic Therapy;Canalith Repostioning;DME Instruction;Gait training;Stair training;Functional mobility training;Patient/family education;Therapeutic activities;Orthotic Fit/Training;Therapeutic exercise;Balance training;Neuromuscular re-education;Vestibular    PT Next Visit Plan Aquatic on 3/7 - pt is diabetic so please make sure she ate before; needs to work on coordination, rotation.  Land: habituation to vertical head movements, supine <> sit, rolling R.  Picking up objects from floor.  Balance on compliant surfaces and with vision removed.  Walking outside with walking stick?  Descending stairs. Continue to progress treadmill training; higher level balance and vestibular training.    PT [place] - 03-24-20    Consulted and Agree with Plan of Care Patient;Family member/caregiver    Family Member Consulted Mother           Patient will benefit from skilled therapeutic intervention in order to improve the following deficits and impairments:  Abnormal gait,Decreased coordination,Difficulty walking,Dizziness,Decreased activity tolerance,Decreased balance,Decreased strength,Impaired sensation,Pain  Visit Diagnosis: Other abnormalities of gait and mobility  Muscle weakness (generalized)  Unsteadiness on feet     Problem List Patient Active Problem List   Diagnosis Date Noted  . Impulsiveness 10/14/2019  . Mild episode of recurrent  major depressive disorder ([place]) 09/04/2019  . Anxiety 08/16/2019  . 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  . 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 Status post radiation therapy 11/22/2018  . Seasonal and perennial allergic rhinitis 11/22/2018  . Seasonal allergic conjunctivitis 11/22/2018  . Acute sinusitis 11/22/2018  . Keratosis pilaris 10/05/2017  . Melanocytic nevi of trunk 10/05/2017  . Adverse food reaction 02/09/2017  . Gastroesophageal reflux disease 02/09/2017  . Mild intermittent asthma with acute exacerbation 11/19/2015  . Mild persistent asthma without complication 54/00/8676  . Acute seasonal allergic rhinitis due to pollen 11/16/2015  . Anaphylactic shock due to adverse food reaction 11/16/2015  . 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 Type 1 diabetes mellitus without complication ([place]) 19/50/9326  . Dry mouth 11/16/2015  . Xerosis cutis 11/16/2015  . Nystagmus 08/15/2014  . Visual field defect 08/15/2014  . Disequilibrium 07/25/2014  . 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    Sheera Illingworth, [place], PT 03/24/2020, 5:14 PM  [facility] [address] [place], [name], [address] Phone: [phone]   Fax:  [phone]  Name: TWILA RAPPA MRN: [id number] Date of Birth: [birth year]

## 2020-03-27 ENCOUNTER — Ambulatory Visit (INDEPENDENT_AMBULATORY_CARE_PROVIDER_SITE_OTHER): Payer: Medicaid Other | Admitting: Clinical

## 2020-03-27 ENCOUNTER — Other Ambulatory Visit: Payer: Self-pay

## 2020-03-27 DIAGNOSIS — F330 Major depressive disorder, recurrent, mild: Secondary | ICD-10-CM | POA: Diagnosis not present

## 2020-03-28 NOTE — Progress Notes (Signed)
   THERAPIST PROGRESS NOTE Virtual Visit via Telephone Note  I connected with Tracey Perkins on 03/27/2020 at  8:00 AM EST by telephone and verified that I am speaking with the correct person using two identifiers.  Location: Patient: home Provider: office   I discussed the limitations, risks, security and privacy concerns of performing an evaluation and management service by telephone and the availability of in person appointments. I also discussed with the patient that there may be a patient responsible charge related to this service. The patient expressed understanding and agreed to proceed.   Follow Up Instructions: I discussed the assessment and treatment plan with the patient. The patient was provided an opportunity to ask questions and all were answered. The patient agreed with the plan and demonstrated an understanding of the instructions.   The patient was advised to call back or seek an in-person evaluation if the symptoms worsen or if the condition fails to improve as anticipated.   Session Time: 30 minutes  Participation Level: Active  Behavioral Response: CasualAlertEuthymic  Type of Therapy: Individual Therapy  Treatment Goals addressed: Anxiety  Interventions: CBT  Summary:  Tracey Perkins is a 35 y.o. female who presents for the scheduled appointment friendly and oriented times five. Client denied hallucinations and delusions. Client reported on today she is doing well. Client reported since the last session her depression has continued to improve. Client reported she is feeling more happy over the pat month. Client reported she still has days when she thinks about her friend that recently passed but she doe snot stay stuck on it. Client reported while she visited her grandparents last month she made a friend that has the same medical condition that she does so it was nice to bond with someone new. Client reported her parents have recently signed her up for the gym so  she can have an extracurricular activity. Client reported last month she had the goal to read and finish a book each month for her [place] book club which she has done successfully. Client reported she would like to be able to do some sort of part time work with [employer] but as her mother pointed out she tires easily and may not be able to do so. Client reported something that has bothered her is anxiety/ panic that pops up when she has a medical emergency in public. Client reported she is not able to calm herself down and her parents help her through it. Client reported she worried about what other people think and being able to remember what to do to help herself.   Suicidal/Homicidal: Nowithout intent/plan  Therapist Response:  Therapist began the session checking in and asking the client how she has been doing since last seen. Therapist actively listened to the clients thoughts and feelings. Therapist used CBT to discuss coping skills for anxiety such as deep breathing and planning ahead. Therapist assigned suggested homework of typing out steps on a piece of paper to keep in her carry on bag with her medication in the event she is not with family to help so she can recall what to do or those with her. Client was scheduled for the next appointment.    Plan: Return again in 4 weeks.  Diagnosis: Mild episode of recurrent major depressive disorder  Rakesh Dutko Y Aleesia Henney, LCSW 03/27/2020

## 2020-03-29 ENCOUNTER — Telehealth: Payer: Self-pay | Admitting: Physical Therapy

## 2020-03-29 NOTE — Telephone Encounter (Signed)
Dr. Pearletha Forge, Tracey Perkins is receiving PT to address gait and balance deficits.  She would benefit from use of a rollator for assistance and safety with community ambulation.  If you agree, would you please place an order in Epic for a  "4 wheeled walker with brakes and a seat", and we will assist her in obtaining this device.    Thank you, Guido Sander, PT

## 2020-03-30 ENCOUNTER — Ambulatory Visit: Payer: Self-pay | Admitting: Physical Therapy

## 2020-03-31 ENCOUNTER — Ambulatory Visit: Payer: Medicaid Other | Admitting: Physical Therapy

## 2020-04-02 DIAGNOSIS — Z9641 Presence of insulin pump (external) (internal): Secondary | ICD-10-CM | POA: Insufficient documentation

## 2020-04-14 ENCOUNTER — Ambulatory Visit: Payer: Medicaid Other | Admitting: Physical Therapy

## 2020-04-20 ENCOUNTER — Other Ambulatory Visit: Payer: Self-pay

## 2020-04-20 ENCOUNTER — Encounter (HOSPITAL_COMMUNITY): Payer: Self-pay | Admitting: Psychiatry

## 2020-04-20 ENCOUNTER — Telehealth (INDEPENDENT_AMBULATORY_CARE_PROVIDER_SITE_OTHER): Payer: Medicaid Other | Admitting: Psychiatry

## 2020-04-20 DIAGNOSIS — F330 Major depressive disorder, recurrent, mild: Secondary | ICD-10-CM | POA: Diagnosis not present

## 2020-04-20 DIAGNOSIS — F419 Anxiety disorder, unspecified: Secondary | ICD-10-CM | POA: Diagnosis not present

## 2020-04-20 MED ORDER — CITALOPRAM HYDROBROMIDE 10 MG PO TABS: 5.0000 mg | ORAL_TABLET | Freq: Every day | ORAL | 2 refills | Status: DC

## 2020-04-20 NOTE — Progress Notes (Signed)
[place] MD/PA/NP OP Progress Note Virtual Visit via Video Note  I connected with Tracey Perkins on 04/20/20 at 10:30 AM EDT by a video enabled telemedicine application and verified that I am speaking with the correct person using two identifiers.  Location: Patient: Home Provider: Clinic   I discussed the limitations of evaluation and management by telemedicine and the availability of in person appointments. The patient expressed understanding and agreed to proceed.  I provided 30 minutes of non-face-to-face time during this encounter.      04/20/2020 10:54 AM Tracey Perkins  MRN:  [id number]  Chief Complaint:  "I'm getting over a virus"     HPI: 35 year old female seen today for follow up psychiatric evaluation.   She has a psychiatric history of depression and anxiey.  She is currently being managed on Celexa 5 mg daily. She notes that her medication is effective in managing her psychiatric conditions.  Today she is well-groomed, pleasant, cooperative, engaged in conversation, and maintained eye contact.  She informed Probation officer that overall she is doing well mentally however notes that she is getting over a virus.  She informed Probation officer that she visited her sister in [place] and picked up a bug.  She notes that she was hospitalized for a day.  Due to the virus she informed provider that she has a poor appetite and has lost some weight.  Patient notes that while in [place] she had 1 panic attack.  She notes that she had to adjust to a chaotic environment.  She notes that her sister has a 33-year-old and a 53-year-old.  Since that incident however patient notes she has minimal anxiety and depression.  Today provider conducted a GAD-7 and patient scored a 7.  Provider also conducted a PHQ-9 and patient scored a 6.  She denies SI/HI/AVH , mania, or paranoia.  Patient notes that her mother has recovered from COVID-19 and is doing well.  She however notes that her grandparents now have Covid.  Provider was  empathetic towards the situation.  No medication changes made today.  Patient agreeable to continue Celexa as prescribed.  Patient will follow up with outpatient counseling for therapy.  No other concerns noted at this time.   Visit Diagnosis:    ICD-10-CM   1. Anxiety  F41.9 citalopram (CELEXA) 10 MG tablet  2. Mild episode of recurrent major depressive disorder (HCC)  F33.0 citalopram (CELEXA) 10 MG tablet    Past Psychiatric History: Anxiety and depression   Past Medical History:  Past Medical History:  Diagnosis Date  . Asthma   . Brain tumor ([place])   . Diabetes mellitus without complication ([place])   . Eczema   . Food allergy   . Gastroparesis   . Hypokalemia   . Hypomagnesemia   . Hypothyroid   . Neuropathy   . Post concussion syndrome 05/2019  . Thyroid disease   . Vision changes     Past Surgical History:  Procedure Laterality Date  . BRAIN SURGERY    . Portacath placement and removed    . WISDOM TOOTH EXTRACTION      Family Psychiatric History: Maternal uncle schizophrenia and overdosed on pain pills. Paternal cousin and aunt bipolar  Family History:  Family History  Problem Relation Age of Onset  . Thyroid disease Mother   . Eczema Sister   . Thyroid disease Sister   . Thyroid disease Sister     Social History:  Social History   Socioeconomic History  . Marital  status: Single    Spouse name: Not on file  . Number of children: Not on file  . Years of education: Not on file  . Highest education level: Not on file  Occupational History  . Not on file  Tobacco Use  . Smoking status: Never Smoker  . Smokeless tobacco: Never Used  Vaping Use  . Vaping Use: Never used  Substance and Sexual Activity  . Alcohol use: No  . Drug use: No  . Sexual activity: Yes    Birth control/protection: Pill  Other Topics Concern  . Not on file  Social History Narrative  . Not on file   Social Determinants of Health   Financial Resource Strain: Not on file   Food Insecurity: Not on file  Transportation Needs: Not on file  Physical Activity: Not on file  Stress: Not on file  Social Connections: Not on file    Allergies:  Allergies  Allergen Reactions  . Banana Other (See Comments)    Mouth burning and swollen  . Bactrim [Sulfamethoxazole-Trimethoprim] Hives  . Doxycycline Hives  . Gentian Violet Other (See Comments)    "It burns."  Per mother  . Gentian Violet-Proflavine Sulfate [Triple Dye] Other (See Comments)    Mouth burn  . Mold Extract [Trichophyton] Other (See Comments)    Hay Fever  . Morphine And Related Hives  . Other Other (See Comments) and Hives    "Hay fever, dust and pollen."  Per patient.  . Vancomycin Other (See Comments)    Red man syndrome     Metabolic Disorder Labs: No results found for: HGBA1C, MPG No results found for: PROLACTIN No results found for: CHOL, TRIG, HDL, CHOLHDL, VLDL, LDLCALC No results found for: TSH  Therapeutic Level Labs: No results found for: LITHIUM No results found for: VALPROATE No components found for:  CBMZ  Current Medications: Current Outpatient Medications  Medication Sig Dispense Refill  . acetaminophen (TYLENOL) 500 MG tablet Take 500 mg by mouth every 6 (six) hours as needed for moderate pain.     Marland Kitchen aspirin EC 81 MG tablet Take 81 mg by mouth daily. Swallow whole.    Marland Kitchen atorvastatin (LIPITOR) 40 MG tablet Take 40 mg by mouth at bedtime.    Marland Kitchen azelastine (ASTELIN) 0.1 % nasal spray Place 2 sprays into both nostrils 2 (two) times daily. 30 mL 5  . cetirizine (ZYRTEC) 10 MG tablet Take 1 tablet (10 mg total) by mouth daily. 34 tablet 5  . citalopram (CELEXA) 10 MG tablet Take 0.5 tablets (5 mg total) by mouth daily. Patient will cut pill in half. She has a traumatic brain injury and will take half the dose. 30 tablet 2  . Continuous Blood Gluc Sensor (FREESTYLE LIBRE SENSOR SYSTEM) MISC USE 1 DEVICE EVERY 10 (TEN) DAYS.  11  . Continuous Blood Gluc Transmit (DEXCOM G6  TRANSMITTER) MISC USE 1 EACH EVERY 3 (THREE) MONTHS    . cyanocobalamin 1000 MCG tablet Take by mouth daily.    Marland Kitchen EPINEPHrine 0.3 mg/0.3 mL IJ SOAJ injection Use as directed for severe allergic reaction. (Patient taking differently: Inject 0.3 mg into the muscle as needed for anaphylaxis. Use as directed for severe allergic reaction.) 2 Device 1  . fluticasone (FLONASE) 50 MCG/ACT nasal spray 2 sprays per nostril daily as needed for stuffy nose. 16 g 5  . fluticasone (FLOVENT HFA) 110 MCG/ACT inhaler Inhale 2 puffs into the lungs daily. Rinse, gargle and spit out after use. 1 each 5  .  gabapentin (NEURONTIN) 300 MG capsule Take 300 mg by mouth at bedtime.   3  . glucagon (GLUCAGON EMERGENCY) 1 MG injection Inject 1 mg into the skin once as needed (for diabeties).  (Patient not taking: Reported on 12/25/2019)    . Glucagon 3 MG/DOSE POWD as needed.    Marland Kitchen glucose blood test strip Use 3 (three) times daily Use as instructed.    . insulin aspart (NOVOLOG) 100 UNIT/ML injection USE UP TO 130 UNITS DAILY, AS DIRECTED IN INSULIN PUMP.    Marland Kitchen insulin glargine (LANTUS) 100 UNIT/ML injection Inject 16 Units into the skin daily.  (Patient not taking: Reported on 12/25/2019)    . Insulin Human (INSULIN PUMP) SOLN Inject 80 each into the skin daily. Novolog Insulin Pump    . KLOR-CON M10 10 MEQ tablet Take 10 mEq by mouth daily.    Marland Kitchen levothyroxine (SYNTHROID, LEVOTHROID) 88 MCG tablet Take 100 mcg by mouth daily before breakfast.     . lubiprostone (AMITIZA) 8 MCG capsule Take 16 mcg by mouth daily with breakfast.     . magnesium oxide (MAG-OX) 400 MG tablet Take 800 mg by mouth at bedtime.    . meclizine (ANTIVERT) 25 MG tablet Take 25 mg by mouth 2 (two) times daily as needed for dizziness.  (Patient not taking: Reported on 12/25/2019)    . montelukast (SINGULAIR) 10 MG tablet Take 1 tablet (10 mg total) by mouth at bedtime. To prevent coughing or wheezing 34 tablet 5  . norgestimate-ethinyl estradiol  (ORTHO-CYCLEN) 0.25-35 MG-MCG tablet Take 1 tablet by mouth daily.     Marland Kitchen PROAIR HFA 108 (90 Base) MCG/ACT inhaler TAKE 2 PUFFS BY MOUTH EVERY 4 HOURS AS NEEDED (Patient taking differently: Inhale 2 puffs into the lungs every 6 (six) hours as needed for wheezing or shortness of breath. ) 6.7 g 0   No current facility-administered medications for this visit.     Musculoskeletal: Strength & Muscle Tone: Unable to assess due to telehealth visit [facility]: Unable to assess due to telehealth visit Patient leans: N/A  Psychiatric Specialty Exam: Review of Systems  There were no vitals taken for this visit.There is no height or weight on file to calculate BMI.  General Appearance: Well Groomed  Eye Contact:  Good  Speech:  Clear and Coherent and Normal Rate  Volume:  Normal  Mood:  Euthymic  Affect:  Appropriate and Congruent  Thought Process:  Coherent, Goal Directed and Linear  Orientation:  Full (Time, Place, and Person)  Thought Content: WDL and Logical   Suicidal Thoughts:  No  Homicidal Thoughts:  No  Memory:  Immediate;   Good Recent;   Good Remote;   Good  Judgement:  Good  Insight:  Good  Psychomotor Activity:  Normal  Concentration:  Concentration: Good and Attention Span: Good  Recall:  Good  Fund of Knowledge: Good  Language: Good  Akathisia:  No  Handed:  Right  AIMS (if indicated):Not done  Assets:  Communication Skills Desire for Improvement Financial Resources/Insurance Housing Social Support  ADL's:  Intact  Cognition: WNL  Sleep:  Good   Screenings: GAD-7   Flowsheet Row Video Visit [facility]/20/2020 in [facility]  Total GAD-7 Score 7    PHQ2-9   Flowsheet Row Video Visit from 04/20/2020 in [facility] Counselor from 09/17/2019 in [facility] Nutrition [facility] 08/20/2014 in [facility]  PHQ-2 Total Score 0 2  0  PHQ-9 Total Score 6 7 --        Assessment and Plan: Patient notes that she is doing well on her current medication regimen.  No medication changes made today.  Patient agreeable to continue medications as prescribed.  1. Anxiety  Continue- citalopram (CELEXA) 10 MG tablet; Take 0.5 tablets (5 mg total) by mouth daily. Patient will cut pill in half. She has a traumatic brain injury and will take half the dose.  Dispense: 30 tablet; Refill: 2  2. Mild episode of recurrent major depressive disorder (HCC)  Continue- citalopram (CELEXA) 10 MG tablet; Take 0.5 tablets (5 mg total) by mouth daily. Patient will cut pill in half. She has a traumatic brain injury and will take half the dose.  Dispense: 30 tablet; Refill: 2     Follow-up in 3 months Follow-up with therapy  Salley Slaughter, NP 04/20/2020, 10:54 AM

## 2020-04-21 ENCOUNTER — Ambulatory Visit: Payer: Medicaid Other | Attending: Neurology | Admitting: Physical Therapy

## 2020-04-21 ENCOUNTER — Encounter: Payer: Self-pay | Admitting: Physical Therapy

## 2020-04-21 ENCOUNTER — Other Ambulatory Visit: Payer: Self-pay

## 2020-04-21 DIAGNOSIS — R2689 Other abnormalities of gait and mobility: Secondary | ICD-10-CM | POA: Diagnosis present

## 2020-04-21 DIAGNOSIS — R2681 Unsteadiness on feet: Secondary | ICD-10-CM

## 2020-04-21 DIAGNOSIS — R296 Repeated falls: Secondary | ICD-10-CM | POA: Insufficient documentation

## 2020-04-21 DIAGNOSIS — M6281 Muscle weakness (generalized): Secondary | ICD-10-CM | POA: Diagnosis present

## 2020-04-22 NOTE — Therapy (Signed)
[facility] [address] [place], [name], [address] Phone: [phone]   Fax:  [phone]  Physical Therapy Treatment  Patient Details  Name: Tracey Perkins MRN: [id number] Date of Birth: 11/24/1985 Referring Provider (PT): Erin Sons, MD   Encounter Date: 04/21/2020   PT End of Session - 04/22/20 1030    Visit Number 32    Number of Visits 41    Authorization Type Requested 3; requested 12 more-per front office staff still awaiting approval of additional visits but instructed to continue with PT sessions.    Authorization - Visit Number 3    Authorization - Number of Visits 12    PT Start Time 3903    PT Stop Time 1100    PT Time Calculation (min) 45 min    Equipment Utilized During Treatment Other (comment)   rollator   Activity Tolerance Patient tolerated treatment well    Behavior During Therapy WFL for tasks assessed/performed           Past Medical History:  Diagnosis Date  . Asthma   . Brain tumor ([place])   . Diabetes mellitus without complication ([place])   . Eczema   . Food allergy   . Gastroparesis   . Hypokalemia   . Hypomagnesemia   . Hypothyroid   . Neuropathy   . Post concussion syndrome 05/2019  . Thyroid disease   . Vision changes     Past Surgical History:  Procedure Laterality Date  . BRAIN SURGERY    . Portacath placement and removed    . WISDOM TOOTH EXTRACTION      There were no vitals filed for this visit.   Subjective Assessment - 04/21/20 1023    Subjective Pt is off of statin medication due to LE pain; is taking B12 supplement and leg pain is much better.  Trip to [place] went well but pt caught a bad stomach virus and was in the hospital.  Has taken her about a week to recover from the virus.  Pt reports feeling better today; went to the gym yesterday, swam for 45 minutes with dad.  Asking about aquatic visits and the rollator.    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    Currently in Pain? No/denies                             [facility] Adult PT Treatment/Exercise - 04/21/20 1059      Transfers   Sit to Stand 5: Supervision    Sit to Stand Details (indicate cue type and reason) reviewed safe sit > stand from mat with rollator locked and not pulling up on rollator.  Also reviewed safe sit <> stand on the rollator for rest breaks and backing it up to a counter or wall to stabilize before sitting.    Stand to Sit 5: Supervision      Ambulation/Gait   Ambulation/Gait Yes    Ambulation/Gait Assistance 4: Min assist    Ambulation/Gait Assistance Details performed gait with rollator over indoor, flat surfaces adding 11lb weight  to the front of the rollator to slow forward momentum and reduce lateral movement of rollator.  Also performed gait outside over uneven paved surfaces with close supervision and verbal cues for safety as gait speed increased with increased trunk flexion and shortened step/stride length.  Cues for more upright posture and increased stride length.    Ambulation Distance (Feet) 500 Feet    Assistive device Rollator    Gait Pattern Step-through pattern;Decreased stride length;Trunk flexed;Decreased trunk rotation    Ambulation Surface Level;Unlevel;Indoor;Outdoor;Paved    Ramp 4: Min assist    Ramp Details (indicate cue type and reason) indoor and outdoor inclines with rollator with verbal and visual cues for more upright posture and to stay close to rollator and to slightly squeeze brakes  to control descent down ramp.    Curb 4: Min assist;3: Mod assist    Curb Details (indicate cue type and reason) curb outside x 2 with verbal and visual cues for safe sequencing; assistance required to lift front of rollator when ascending curb due to weights on front      Therapeutic Activites    Therapeutic Activities Other Therapeutic Activities    Other Therapeutic Activities discussed adding more aquatic visits and having PTA schedule with mother directly.  Also discussed referral for rollator and how to obtain.                  PT Education - 04/22/20 1025    Education Details Will re-send request for rollator order via fax.  Will message PTA about resuming aquatic therapy. Gait training with rollator.    Person(s) Educated Patient;Parent(s)    Methods Explanation;Demonstration    Comprehension Need further instruction;Verbalized understanding            PT Short Term Goals - 03/16/20 1223      PT SHORT TERM GOAL #1   Title = LTG             PT Long Term Goals - 03/24/20 1711      PT LONG TERM GOAL #1   Title Patient will demonstrate ability to perform final land and aquatic HEP independently at home and at gym for ongoing wellness.    Baseline is performing HEP and treadmill; has joined a gym but not going consistently    Time 8    Period Weeks    Status Revised      PT LONG TERM GOAL #2   Title Pt will improve DGI without AD to >/= 22/24 points to indicate decreased falls risk    Baseline 19/24    Time 8    Period Weeks    Status Revised      PT LONG TERM GOAL #3   Title Pt will demonstrate ability to perform MCTSIB condition 3 for 15 seconds and condition 4 for up to 10 seconds to indicate improved sensory integration    Baseline Condition 3: 5 seconds; Condition 4: unable    Time 8    Period Weeks    Status Revised      PT LONG TERM GOAL #4   Title Pt will report no dizziness with vertical head movements, bending down to the floor, supine <> sit  and rolling in bed in order to decrease falls risk with ADL and mobility    Baseline dizziness with vertical head movements, sit > supine and rolling to R but pt reporting aural fullness and issues with allergies    Time 8    Period Weeks  Status Revised                 Plan - 04/22/20 1140    Clinical Impression Statement Pt returns from being out of town and recent hospitalization due to virus; pt does not demonstrate significant decline in function following illness and continues to perform exercises and swimming with father's assistance.  Pt to restart aquatic therapy next week.  Continued to perform gait training with clinic rollator introducing more uneven terrain with inclines and curb.  Pt continues to require mod-max cues for safety and sequencing.  Will continue to address to ensure safe use of rollator when pt receives her own.    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, childhood, nystagmus, visual field defect, sensorineural hearing loss of bilat ears    Rehab Potential Good    PT Frequency Other (comment)   2x/week x 4 (land and aquatic); + 1x/week x 4 (land only)   PT Duration Other (comment)    PT Treatment/Interventions ADLs/Self Care Home Management;Aquatic Therapy;Canalith Repostioning;DME Instruction;Gait training;Stair training;Functional mobility training;Patient/family education;Therapeutic activities;Orthotic Fit/Training;Therapeutic exercise;Balance training;Neuromuscular re-education;Vestibular    PT Next Visit Plan Aquatic - pt is diabetic so please make sure she eats before; needs to work on coordination, trunk rotation, descending stairs.  Land: gait with rollator, habituation to vertical head movements, supine <> sit, rolling R.   Picking up objects from floor.  Balance on compliant surfaces and with vision removed.  Descending stairs. Continue to progress treadmill training; higher level balance and vestibular training    PT [place] - 03-24-20    Recommended Other Services has order for rollator come back yet?  Faxed to [facility] neurology on 4/7    Consulted and Agree with Plan of Care Patient;Family member/caregiver    Family Member Consulted Mother           Patient will benefit from skilled therapeutic intervention in order to improve the following deficits and impairments:  Abnormal gait,Decreased coordination,Difficulty walking,Dizziness,Decreased activity tolerance,Decreased balance,Decreased strength,Impaired sensation,Pain  Visit Diagnosis: Other abnormalities of gait and mobility  Muscle weakness (generalized)  Unsteadiness on feet  Repeated falls     Problem List Patient Active Problem List   Diagnosis Date Noted  . Impulsiveness 10/14/2019  . Mild episode of recurrent major depressive disorder ([place]) 09/04/2019  . Anxiety 08/16/2019  . 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  . 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 Status post radiation therapy 11/22/2018  . Seasonal and perennial allergic rhinitis 11/22/2018  . Seasonal allergic conjunctivitis 11/22/2018  . Acute sinusitis 11/22/2018  . Keratosis pilaris 10/05/2017  . Melanocytic nevi of trunk 10/05/2017  . Adverse food reaction 02/09/2017  . Gastroesophageal reflux disease 02/09/2017  . Mild intermittent asthma with acute exacerbation 11/19/2015  . Mild persistent asthma without complication 25/85/2778  . Acute seasonal allergic rhinitis due to pollen 11/16/2015  . Anaphylactic shock due to adverse food reaction 11/16/2015  . 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 Type 1 diabetes mellitus without complication ([place]) 24/23/5361  . Dry mouth 11/16/2015  . Xerosis cutis 11/16/2015  . Nystagmus 08/15/2014  .  Visual field defect 08/15/2014  . Disequilibrium 07/25/2014  . 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    Rico Junker, PT, DPT 04/22/20    11:52 AM    [facility]  [address] [place], [name], [address] Phone: [phone]   Fax:  [phone]  Name: Tracey Perkins MRN: [id number] Date of Birth: 26-Jul-1985

## 2020-04-28 ENCOUNTER — Other Ambulatory Visit: Payer: Self-pay

## 2020-04-28 ENCOUNTER — Encounter: Payer: Self-pay | Admitting: Physical Therapy

## 2020-04-28 ENCOUNTER — Ambulatory Visit: Payer: Medicaid Other | Admitting: Physical Therapy

## 2020-04-28 DIAGNOSIS — R2689 Other abnormalities of gait and mobility: Secondary | ICD-10-CM

## 2020-04-28 DIAGNOSIS — M6281 Muscle weakness (generalized): Secondary | ICD-10-CM

## 2020-04-28 DIAGNOSIS — R2681 Unsteadiness on feet: Secondary | ICD-10-CM

## 2020-04-28 NOTE — Therapy (Signed)
[facility] [address] [place], [name], [address] Phone: [phone]   Fax:  [phone]  Physical Therapy Treatment  Patient Details  Name: Tracey Perkins MRN: [id number] Date of Birth: 03-17-1985 Referring Provider (PT): Erin Sons, MD   Encounter Date: 04/28/2020   PT End of Session - 04/28/20 1638    Visit Number 33    Number of Visits 41    Authorization Type Requested 3; requested 12 more-per front office staff still awaiting approval of additional visits but instructed to continue with PT sessions.    Authorization - Visit Number 4    Authorization - Number of Visits 12    PT Start Time 2119    PT Stop Time 1230    PT Time Calculation (min) 45 min    Equipment Utilized During Treatment Other (comment)   ankle buoyancy cuffs, pool noodle   Activity Tolerance Patient tolerated treatment well    Behavior During Therapy WFL for tasks assessed/performed           Past Medical History:  Diagnosis Date  . Asthma   . Brain tumor ([place])   . Diabetes mellitus without complication ([place])   . Eczema   . Food allergy   . Gastroparesis   . Hypokalemia   . Hypomagnesemia   . Hypothyroid   . Neuropathy   . Post concussion syndrome 05/2019  . Thyroid disease   . Vision changes     Past Surgical History:  Procedure Laterality Date  . BRAIN SURGERY    . Portacath placement and removed    . WISDOM TOOTH EXTRACTION      There were no vitals filed for this visit.   Subjective Assessment - 04/28/20 1636    Subjective Pt reports going to gym to swim with her dad.  Had difficulty especially kicking.  Having nystagmus today.    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    Currently in Pain? No/denies           Aquatic therapy at [facility]   Patient seen for aquatic therapy today. Treatment took place in water 3.6-4.97feet deep depending upon activity. Pt entered and exited the pool viastepnegotiation with use of bil. Hand rails for support.; 6 steps - approx. 4" height  Pt performed bil. Hamstrings/heel cord stretch (runner's stretch) - 1 rep each leg for 30 sec hold  Gait training focused on initial gait withforward walking without UE supportin 3.6-4.0 ft of water. Progressed tobackward walking, side stepping.Performed water walking above both horizontal and vertical in pool. Braiding LE's with UE support of pool noodle supported by PTA.  Pt sat on bench in pool - LAQ's 15each leg ; seated hip flexion 15 repseach leg;ankle dorsiflexion/plantar flexion with knee extended. Placed ankle cuffs and performed LAQ and hip flexion again x 10 reps  Pt swam length of pool with close supervision of PTA.  Floated on back with close supervision.  Placed pool noodle under arms and neck noodle on pt in supine position and performed LE hip abd/add x 20 reps, flutter kicks x 10 reps and bicycling  x 30 reps.  Pt requires buoyancy of water for support and for joint off loading for reduced pain with weight bearing exercise; pt requires viscosity of water for resistance for strengthening; Gait training is able to be performed in the water with increasedtolerance due to joint off loading and reduced fear of fall.  Pt able to perform activities in water that would not be safe on land due to fall risk.      PT Short Term Goals - 03/16/20 1223      PT SHORT TERM GOAL #1   Title = LTG             PT Long Term Goals -  03/24/20 1711      PT LONG TERM GOAL #1   Title Patient will demonstrate ability to perform final land and aquatic HEP independently at home and at gym for ongoing wellness.    Baseline is performing HEP and treadmill; has joined a gym but not going consistently    Time 8    Period Weeks    Status Revised      PT LONG TERM GOAL #2   Title Pt will improve DGI without AD to >/= 22/24 points to indicate decreased falls risk    Baseline 19/24    Time 8    Period Weeks    Status Revised      PT LONG TERM GOAL #3   Title Pt will demonstrate ability to perform MCTSIB condition 3 for 15 seconds and condition 4 for up to 10 seconds to indicate improved sensory integration    Baseline Condition 3: 5 seconds; Condition 4: unable    Time 8    Period Weeks    Status Revised      PT LONG TERM GOAL #4   Title Pt will report no dizziness with vertical head movements, bending down to the floor, supine <> sit and rolling in bed in order to decrease falls risk with ADL and mobility    Baseline dizziness with vertical head movements, sit > supine and rolling to R but pt reporting aural fullness and issues with allergies    Time 8    Period Weeks    Status Revised                 Plan - 04/28/20 1638    Clinical Impression Statement Skilled session focused on gait, balance, strength and flexibility.  Pt did not require rest breaks during session  Does need cues through out to stay on task and for techniques.  Cont per poc.    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, childhood, nystagmus, visual field defect, sensorineural hearing loss of bilat ears    Rehab Potential Good    PT Frequency Other (comment)   2x/week x 4 (land and aquatic); + 1x/week x 4 (land only)   PT  Duration Other (comment)    PT Treatment/Interventions ADLs/Self Care Home Management;Aquatic Therapy;Canalith Repostioning;DME Instruction;Gait training;Stair training;Functional mobility training;Patient/family education;Therapeutic activities;Orthotic Fit/Training;Therapeutic exercise;Balance training;Neuromuscular re-education;Vestibular    PT Next Visit Plan Aquatic - pt is diabetic so please make sure she eats before; needs to work on coordination, trunk rotation, descending stairs.  Land: gait with rollator, habituation to vertical head movements, supine <> sit, rolling R.  Picking up objects from floor.  Balance on compliant surfaces  and with vision removed.  Descending stairs. Continue to progress treadmill training; higher level balance and vestibular training    PT [place] - 03-24-20    Consulted and Agree with Plan of Care Patient;Family member/caregiver    Family Member Consulted Mother           Patient will benefit from skilled therapeutic intervention in order to improve the following deficits and impairments:  Abnormal gait,Decreased coordination,Difficulty walking,Dizziness,Decreased activity tolerance,Decreased balance,Decreased strength,Impaired sensation,Pain  Visit Diagnosis: Other abnormalities of gait and mobility  Muscle weakness (generalized)  Unsteadiness on feet     Problem List Patient Active Problem List   Diagnosis Date Noted  . Impulsiveness 10/14/2019  . Mild episode of recurrent major depressive disorder ([place]) 09/04/2019  . Anxiety 08/16/2019  . 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  . 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 Status post radiation therapy 11/22/2018  . Seasonal and perennial allergic rhinitis 11/22/2018  . Seasonal allergic conjunctivitis 11/22/2018  . Acute sinusitis 11/22/2018  . Keratosis pilaris 10/05/2017  . Melanocytic nevi of trunk 10/05/2017  . Adverse food reaction 02/09/2017  .  Gastroesophageal reflux disease 02/09/2017  . Mild intermittent asthma with acute exacerbation 11/19/2015  . Mild persistent asthma without complication 53/20/2334  . Acute seasonal allergic rhinitis due to pollen 11/16/2015  . Anaphylactic shock due to adverse food reaction 11/16/2015  . 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 Type 1 diabetes mellitus without complication ([place]) 35/68/6168  . Dry mouth 11/16/2015  . Xerosis cutis 11/16/2015  . Nystagmus 08/15/2014  . Visual field defect 08/15/2014  . Disequilibrium 07/25/2014  . 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    Narda Bonds, PTA [facility] 04/28/20 4:43 PM Phone: [phone] Fax: [facility] [address] [place], [name], [address] Phone: [phone]   Fax:  [phone]  Name: Tracey Perkins MRN: [id number] Date of Birth: 05-07-1985

## 2020-04-29 ENCOUNTER — Ambulatory Visit: Payer: Medicaid Other | Admitting: Physical Therapy

## 2020-04-29 DIAGNOSIS — M6281 Muscle weakness (generalized): Secondary | ICD-10-CM

## 2020-04-29 DIAGNOSIS — R296 Repeated falls: Secondary | ICD-10-CM

## 2020-04-29 DIAGNOSIS — R2681 Unsteadiness on feet: Secondary | ICD-10-CM

## 2020-04-29 DIAGNOSIS — R2689 Other abnormalities of gait and mobility: Secondary | ICD-10-CM | POA: Diagnosis not present

## 2020-04-29 NOTE — Therapy (Signed)
[facility] [address] [place], [name], [address] Phone: [phone]   Fax:  [phone]  Physical Therapy Treatment  Patient Details  Name: Tracey Perkins MRN: [id number] Date of Birth: 1985/06/15 Referring Provider (PT): Erin Sons, MD   Encounter Date: 04/29/2020   PT End of Session - 04/29/20 1458    Visit Number 34    Number of Visits 41    Authorization Type Requested 3; requested 12 more-per front office staff still awaiting approval of additional visits but instructed to continue with PT sessions.    Authorization - Visit Number 5    Authorization - Number of Visits 12    PT Start Time 5170    PT Stop Time 1532    PT Time Calculation (min) 39 min    Equipment Utilized During Treatment Other (comment)   rollator   Activity Tolerance Patient tolerated treatment well    Behavior During Therapy WFL for tasks assessed/performed           Past Medical History:  Diagnosis Date  . Asthma   . Brain tumor ([place])   . Diabetes mellitus without complication ([place])   . Eczema   . Food allergy   . Gastroparesis   . Hypokalemia   . Hypomagnesemia   . Hypothyroid   . Neuropathy   . Post concussion syndrome 05/2019  . Thyroid disease   . Vision changes     Past Surgical History:  Procedure Laterality Date  . BRAIN SURGERY    . Portacath placement and removed    . WISDOM TOOTH EXTRACTION      There were no vitals filed for this visit.   Subjective Assessment - 04/29/20 1459    Subjective Did well at the pool yesterday; feels a little wobbly today.    Patient is accompained by: Family member   Mother, [name]   Pertinent History asthma, eczema, food allergy, polyneuropathy, uncontrolled type 1 diabetes, gastroparesis, h/o chronic L thalamic infarct, new R thalamic infarct since 2016, lymphocytic thyroiditis, s/p radiation therapy, chemo and 2 surgical resection for medulloblastoma, childhood,  nystagmus, visual field defect, sensorineural hearing loss of bilat ears    Patient Stated Goals Improving balance and decreasing falls.    Currently in Pain? No/denies                             [facility] Adult PT Treatment/Exercise - 04/29/20 1601      Transfers   Transfers Sit to Stand;Stand to Sit    Sit to Stand 5: Supervision    Sit to Stand Details (indicate cue type and reason) still requires cues to place rollator in front of her and lock brakes before standing    Stand to Sit 5: Supervision    Stand to Sit Details cues to fully pivot and keep rollator in front of her and to lock brakes before sitting; discussed real life situations where this would be important to prevent rollator from rolling away      Ambulation/Gait   Ambulation/Gait Yes    Ambulation/Gait Assistance 5: Supervision    Ambulation/Gait Assistance Details performed gait indoors with rollator with focus on obstacle negotiation today.  Performed weaving L and R around obstacles x 50' x 4 reps with obstacles far apart and then closer together.  Cues to attend to environment and location of obstacle and for wider turns to avoid hitting obstacles.  Also performed stepping over  low obstacles with UE support on rollator - pt able to safely maintain forward momentum of rollator while stepping over and did not need to stop prior to stepping over.    Ambulation Distance (Feet) 500 Feet    Assistive device Rollator    Gait Pattern Step-through pattern;Decreased stride length;Trunk flexed;Decreased trunk rotation;Decreased hip/knee flexion - right;Decreased hip/knee flexion - left;Poor foot clearance - left    Ambulation Surface Level;Indoor      Therapeutic Activites    Therapeutic Activities Other Therapeutic Activities    Other Therapeutic Activities practiced various ways to reach down to the floor to retrieve objects - performed reaching down and to the side with one UE x 8 reps with LUE and then 8 reps  with RUE with cues to stop rollator directly beside object, locking brakes and using one UE support on rollator to prevent LOB.  Also reviewed how to roll forwards and park rollator in front of object and then squat down to pick up object with bilat UE; performed x 8 with supervision.  No dizziness reaching to floor and back up.                  PT Education - 04/29/20 1607    Education Details gait with rollator training; obstacles    Person(s) Educated Patient;Parent(s)    Methods Explanation;Demonstration    Comprehension Verbalized understanding;Returned demonstration            PT Short Term Goals - 03/16/20 1223      PT SHORT TERM GOAL #1   Title = LTG             PT Long Term Goals - 03/24/20 1711      PT LONG TERM GOAL #1   Title Patient will demonstrate ability to perform final land and aquatic HEP independently at home and at gym for ongoing wellness.    Baseline is performing HEP and treadmill; has joined a gym but not going consistently    Time 8    Period Weeks    Status Revised      PT LONG TERM GOAL #2   Title Pt will improve DGI without AD to >/= 22/24 points to indicate decreased falls risk    Baseline 19/24    Time 8    Period Weeks    Status Revised      PT LONG TERM GOAL #3   Title Pt will demonstrate ability to perform MCTSIB condition 3 for 15 seconds and condition 4 for up to 10 seconds to indicate improved sensory integration    Baseline Condition 3: 5 seconds; Condition 4: unable    Time 8    Period Weeks    Status Revised      PT LONG TERM GOAL #4   Title Pt will report no dizziness with vertical head movements, bending down to the floor, supine <> sit and rolling in bed in order to decrease falls risk with ADL and mobility    Baseline dizziness with vertical head movements, sit > supine and rolling to R but pt reporting aural fullness and issues with allergies    Time 8    Period Weeks    Status Revised                  Plan - 04/29/20 1556    Clinical Impression Statement Continued to perform higher level gait and safety training with rollator with focus on obstacle negotiation - weaving around obstacles and  stepping over obstacles safely.  Cues for safety, sequencing and attention to environment.  Continues to require verbal cues for safe transfers sit <> stand and appropriate use of brakes.  Still awating return of MD order/prescription for rollator.  Will continue to address and progress towards LTG.    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, childhood, nystagmus, visual field defect, sensorineural hearing loss of bilat ears    Rehab Potential Good    PT Frequency Other (comment)   2x/week x 4 (land and aquatic); + 1x/week x 4 (land only)   PT Duration Other (comment)    PT Treatment/Interventions ADLs/Self Care Home Management;Aquatic Therapy;Canalith Repostioning;DME Instruction;Gait training;Stair training;Functional mobility training;Patient/family education;Therapeutic activities;Orthotic Fit/Training;Therapeutic exercise;Balance training;Neuromuscular re-education;Vestibular    PT Next Visit Plan Aquatic - pt is diabetic so please make sure she eats before; needs to work on coordination, trunk rotation, descending stairs.  Land: gait with rollator with higher level challenges/outside, habituation to vertical head movements, supine <> sit, rolling R.  Picking up objects from floor.  Balance on compliant surfaces and with vision removed.  Descending stairs. Continue to progress treadmill training; higher level balance and vestibular training    PT [place] - 03-24-20    Consulted and Agree with Plan of Care Patient;Family member/caregiver    Family Member  Consulted Mother           Patient will benefit from skilled therapeutic intervention in order to improve the following deficits and impairments:  Abnormal gait,Decreased coordination,Difficulty walking,Dizziness,Decreased activity tolerance,Decreased balance,Decreased strength,Impaired sensation,Pain  Visit Diagnosis: Other abnormalities of gait and mobility  Muscle weakness (generalized)  Unsteadiness on feet  Repeated falls     Problem List Patient Active Problem List   Diagnosis Date Noted  . Impulsiveness 10/14/2019  . Mild episode of recurrent major depressive disorder ([place]) 09/04/2019  . Anxiety 08/16/2019  . 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  . 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 Status post radiation therapy 11/22/2018  . Seasonal and perennial allergic rhinitis 11/22/2018  . Seasonal allergic conjunctivitis 11/22/2018  . Acute sinusitis 11/22/2018  . Keratosis pilaris 10/05/2017  . Melanocytic nevi of trunk 10/05/2017  . Adverse food reaction 02/09/2017  . Gastroesophageal reflux disease 02/09/2017  . Mild intermittent asthma with acute exacerbation 11/19/2015  . Mild persistent asthma without complication 80/32/1224  . Acute seasonal allergic rhinitis due to pollen 11/16/2015  . Anaphylactic shock due to adverse food reaction 11/16/2015  . 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 Type 1 diabetes mellitus without complication ([place]) 82/50/0370  . Dry mouth 11/16/2015  . Xerosis cutis 11/16/2015  . Nystagmus 08/15/2014  . Visual field defect 08/15/2014  . Disequilibrium 07/25/2014  . 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   Rico Junker, PT, DPT 04/29/20    4:08 PM    [facility] [address] [place], [name], [address] Phone: [phone]   Fax:  [phone]  Name: Tracey Perkins MRN: [id number] Date of Birth: [birth year]

## 2020-05-04 ENCOUNTER — Ambulatory Visit: Payer: Medicaid Other | Admitting: Physical Therapy

## 2020-05-04 ENCOUNTER — Other Ambulatory Visit: Payer: Self-pay

## 2020-05-04 DIAGNOSIS — R2681 Unsteadiness on feet: Secondary | ICD-10-CM

## 2020-05-04 DIAGNOSIS — M6281 Muscle weakness (generalized): Secondary | ICD-10-CM

## 2020-05-04 DIAGNOSIS — R2689 Other abnormalities of gait and mobility: Secondary | ICD-10-CM

## 2020-05-06 ENCOUNTER — Encounter: Payer: Self-pay | Admitting: Physical Therapy

## 2020-05-06 NOTE — Therapy (Addendum)
[facility] [address] [place], [name], [address] Phone: [phone]   Fax:  [phone]  Physical Therapy Treatment  Patient Details  Name: Tracey Perkins MRN: [id number] Date of Birth: [birth year] Referring Provider (PT): Erin Sons, MD   Encounter Date: 05/04/2020   PT End of Session - 05/06/20 1837    Visit Number 35    Number of Visits 41    Authorization Type Requested 3; requested 12 more-per front office staff still awaiting approval of additional visits but instructed to continue with PT sessions.    Authorization - Visit Number 6    Authorization - Number of Visits 12    PT Start Time 1330    PT Stop Time 1415    PT Time Calculation (min) 45 min    Equipment Utilized During Treatment Other (comment)   buoyancy ankle cuffs, pool noodles   Activity Tolerance Patient tolerated treatment well    Behavior During Therapy WFL for tasks assessed/performed           Past Medical History:  Diagnosis Date  . Asthma   . Brain tumor ([place])   . Diabetes mellitus without complication ([place])   . Eczema   . Food allergy   . Gastroparesis   . Hypokalemia   . Hypomagnesemia   . Hypothyroid   . Neuropathy   . Post concussion syndrome 05/2019  . Thyroid disease   . Vision changes     Past Surgical History:  Procedure Laterality Date  . BRAIN SURGERY    . Portacath placement and removed    . WISDOM TOOTH EXTRACTION      There were no vitals filed for this visit.   Subjective Assessment - 05/06/20 1836    Subjective Has been to pool again with her dad.  Feels comfortable with aquatic program to do with her dad.    Patient is accompained by: Family member   Mother, [name]   Pertinent History asthma, eczema, food allergy, polyneuropathy, uncontrolled type 1 diabetes, gastroparesis, h/o chronic L thalamic infarct, new R thalamic infarct since 2016, lymphocytic thyroiditis, s/p radiation therapy, chemo  and 2 surgical resection for medulloblastoma, childhood, nystagmus, visual field defect, sensorineural hearing loss of bilat ears    Patient Stated Goals Improving balance and decreasing falls.    Currently in Pain? No/denies           Aquatic therapy at [facility]   Patient seen for aquatic therapy today. Treatment took place in water 3.6-4.30feet deep depending upon activity. Pt entered and exited the pool viastepnegotiation with use of bil. Hand rails for support.; 6 steps - approx. 4" height  Pt performed bil. Hamstrings/heel cord stretch (runner's stretch) - 1 rep each leg for 30 sec hold  Gait training focused on initial gait withforward walking without UE supportin 3.6-4.51ft of water. Progressed tobackward walking, side stepping.Performed water walking above both horizontal and vertical in pool. Braiding LE's with verbal cues to maintain upright posture.  Pt sat on bench in pool - LAQ's 15each leg ; seated hip flexion 15 repseach leg;ankle dorsiflexion/plantar flexion with knee extended. Placed ankle cuffs and performed LAQ and hip flexion again x 10 reps  Standing at pool wall for bil LE squats, single leg squats.  Placed on ankle buoyancy cuffs for bil hip flexion, hip extension, hip flexion moving into extension, hip abd, hamstring curl and heel raises all x 15 reps.  Standing SLS x 10 sec x 3 each side.  Eyes closed x 10 seconds x 3 reps.  Pt swam length of pool with close supervision of PTA.  Floated on back with close supervision.  Placed pool noodle under arms and neck noodle on pt in supine position and performed LE hip abd/add x 20 reps, flutter kicks x 10 reps and bicycling x 30 reps.  Pt requires buoyancy of water for support and for joint off loading for reduced pain with weight bearing exercise; pt requires viscosity of water for resistance for strengthening; Gait training is able to be performed in the water with increasedtolerance  due to joint off loadingand reduced fear of fall. Pt able to perform activities in water that would not be safe on land due to fall risk.        PT Education - 05/06/20 1840    Education Details Aquatic HEP with handout/code to be provided at next land session    Person(s) Educated Patient;Parent(s)    Methods Explanation;Demonstration    Comprehension Verbalized understanding;Returned demonstration            PT Short Term Goals - 03/16/20 1223      PT SHORT TERM GOAL #1   Title = LTG             PT Long Term Goals - 03/24/20 1711      PT LONG TERM GOAL #1   Title Patient will demonstrate ability to perform final land and aquatic HEP independently at home and at gym for ongoing wellness.    Baseline is performing HEP and treadmill; has joined a gym but not going consistently    Time 8    Period Weeks    Status Revised      PT LONG TERM GOAL #2   Title Pt will improve DGI without AD to >/= 22/24 points to indicate decreased falls risk    Baseline 19/24    Time 8    Period Weeks    Status Revised      PT LONG TERM GOAL #3   Title Pt will demonstrate ability to perform MCTSIB condition 3 for 15 seconds and condition 4 for up to 10 seconds to indicate improved sensory integration    Baseline Condition 3: 5 seconds; Condition 4: unable    Time 8    Period Weeks    Status Revised      PT LONG TERM GOAL #4   Title Pt will report no dizziness with vertical head movements, bending down to the floor, supine <> sit and rolling in bed in order to decrease falls risk with ADL and mobility    Baseline dizziness with vertical head movements, sit > supine and rolling to R but pt reporting aural fullness and issues with allergies    Time 8    Period Weeks    Status Revised                 Plan - 05/06/20 1839    Clinical Impression Statement Pt reports feeling comfortable with Aquatic HEP and will continue to perform with her parents.  Pt to continue with land  based PT per poc.    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, childhood, nystagmus, visual field defect, sensorineural hearing loss of bilat ears    Rehab Potential Good    PT Frequency Other (comment)  2x/week x 4 (land and aquatic); + 1x/week x 4 (land only)   PT Duration Other (comment)    PT Treatment/Interventions ADLs/Self Care Home Management;Aquatic Therapy;Canalith Repostioning;DME Instruction;Gait training;Stair training;Functional mobility training;Patient/family education;Therapeutic activities;Orthotic Fit/Training;Therapeutic exercise;Balance training;Neuromuscular re-education;Vestibular    PT Next Visit Plan Aquatic - pt is diabetic so please make sure she eats before; needs to work on coordination, trunk rotation, descending stairs.  Land: gait with rollator with higher level challenges/outside, habituation to vertical head movements, supine <> sit, rolling R.  Picking up objects from floor.  Balance on compliant surfaces and with vision removed.  Descending stairs. Continue to progress treadmill training; higher level balance and vestibular training    PT [place] - 03-24-20 Aquatic HEP Access Code [email or web address]   Consulted and Agree with Plan of Care Patient;Family member/caregiver    Family Member Consulted Mother           Patient will benefit from skilled therapeutic intervention in order to improve the following deficits and impairments:  Abnormal gait,Decreased coordination,Difficulty walking,Dizziness,Decreased activity tolerance,Decreased balance,Decreased strength,Impaired sensation,Pain  Visit Diagnosis: Other abnormalities of gait and mobility  Muscle weakness (generalized)  Unsteadiness on feet     Problem  List Patient Active Problem List   Diagnosis Date Noted  . Impulsiveness 10/14/2019  . Mild episode of recurrent major depressive disorder ([place]) 09/04/2019  . Anxiety 08/16/2019  . 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  . 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 Status post radiation therapy 11/22/2018  . Seasonal and perennial allergic rhinitis 11/22/2018  . Seasonal allergic conjunctivitis 11/22/2018  . Acute sinusitis 11/22/2018  . Keratosis pilaris 10/05/2017  . Melanocytic nevi of trunk 10/05/2017  . Adverse food reaction 02/09/2017  . Gastroesophageal reflux disease 02/09/2017  . Mild intermittent asthma with acute exacerbation 11/19/2015  . Mild persistent asthma without complication 14/43/1540  . Acute seasonal allergic rhinitis due to pollen 11/16/2015  . Anaphylactic shock due to adverse food reaction 11/16/2015  . 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 Type 1 diabetes mellitus without complication ([place]) 08/67/6195  . Dry mouth 11/16/2015  . Xerosis cutis 11/16/2015  . Nystagmus 08/15/2014  . Visual field defect 08/15/2014  . Disequilibrium 07/25/2014  . 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    Narda Bonds, PTA [facility] 05/06/20 6:44 PM Phone: [phone] Fax: [facility] [address] [place], [name], [address] Phone: [phone]   Fax:  [phone]  Name: MIRRANDA MONRROY MRN: [id number] Date of Birth: November 25, 1985

## 2020-05-12 ENCOUNTER — Ambulatory Visit: Payer: Medicaid Other | Admitting: Physical Therapy

## 2020-05-15 DIAGNOSIS — E1042 Type 1 diabetes mellitus with diabetic polyneuropathy: Secondary | ICD-10-CM | POA: Insufficient documentation

## 2020-05-15 DIAGNOSIS — F0781 Postconcussional syndrome: Secondary | ICD-10-CM | POA: Insufficient documentation

## 2020-05-21 ENCOUNTER — Encounter: Payer: Medicaid Other | Admitting: Physical Therapy

## 2020-05-26 ENCOUNTER — Ambulatory Visit: Payer: Medicaid Other | Admitting: Physical Therapy

## 2020-05-27 ENCOUNTER — Emergency Department (HOSPITAL_COMMUNITY)
Admission: EM | Admit: 2020-05-27 | Discharge: 2020-05-27 | Disposition: A | Discharge status: Home / Self Care | Payer: Medicaid Other | Attending: Emergency Medicine | Admitting: Emergency Medicine

## 2020-05-27 ENCOUNTER — Other Ambulatory Visit: Payer: Self-pay

## 2020-05-27 ENCOUNTER — Encounter (HOSPITAL_COMMUNITY): Payer: Self-pay | Admitting: Emergency Medicine

## 2020-05-27 DIAGNOSIS — Z794 Long term (current) use of insulin: Secondary | ICD-10-CM | POA: Diagnosis not present

## 2020-05-27 DIAGNOSIS — K3184 Gastroparesis: Secondary | ICD-10-CM | POA: Insufficient documentation

## 2020-05-27 DIAGNOSIS — E039 Hypothyroidism, unspecified: Secondary | ICD-10-CM | POA: Diagnosis not present

## 2020-05-27 DIAGNOSIS — Z79899 Other long term (current) drug therapy: Secondary | ICD-10-CM | POA: Diagnosis not present

## 2020-05-27 DIAGNOSIS — R197 Diarrhea, unspecified: Secondary | ICD-10-CM | POA: Diagnosis not present

## 2020-05-27 DIAGNOSIS — R112 Nausea with vomiting, unspecified: Secondary | ICD-10-CM | POA: Diagnosis present

## 2020-05-27 DIAGNOSIS — J45909 Unspecified asthma, uncomplicated: Secondary | ICD-10-CM | POA: Diagnosis not present

## 2020-05-27 DIAGNOSIS — R000 Tachycardia, unspecified: Secondary | ICD-10-CM | POA: Insufficient documentation

## 2020-05-27 DIAGNOSIS — E109 Type 1 diabetes mellitus without complications: Secondary | ICD-10-CM | POA: Insufficient documentation

## 2020-05-27 DIAGNOSIS — Z7982 Long term (current) use of aspirin: Secondary | ICD-10-CM | POA: Insufficient documentation

## 2020-05-27 DIAGNOSIS — Z7951 Long term (current) use of inhaled steroids: Secondary | ICD-10-CM | POA: Insufficient documentation

## 2020-05-27 LAB — CBC WITH DIFFERENTIAL/PLATELET
Abs Immature Granulocytes: 0.01 10*3/uL (ref 0.00–0.07)
Basophils Absolute: 0 10*3/uL (ref 0.0–0.1)
Basophils Relative: 0 %
Eosinophils Absolute: 0.1 10*3/uL (ref 0.0–0.5)
Eosinophils Relative: 1 %
HCT: 41.5 % (ref 36.0–46.0)
Hemoglobin: 13.8 g/dL (ref 12.0–15.0)
Immature Granulocytes: 0 %
Lymphocytes Relative: 17 %
Lymphs Abs: 1.8 10*3/uL (ref 0.7–4.0)
MCH: 30.7 pg (ref 26.0–34.0)
MCHC: 33.3 g/dL (ref 30.0–36.0)
MCV: 92.4 fL (ref 80.0–100.0)
Monocytes Absolute: 0.9 10*3/uL (ref 0.1–1.0)
Monocytes Relative: 9 %
Neutro Abs: 7.3 10*3/uL (ref 1.7–7.7)
Neutrophils Relative %: 73 %
Platelets: 433 10*3/uL — ABNORMAL HIGH (ref 150–400)
RBC: 4.49 MIL/uL (ref 3.87–5.11)
RDW: 13.3 % (ref 11.5–15.5)
WBC: 10.2 10*3/uL (ref 4.0–10.5)
nRBC: 0 % (ref 0.0–0.2)

## 2020-05-27 LAB — CK: Total CK: 84 U/L (ref 38–234)

## 2020-05-27 LAB — I-STAT VENOUS BLOOD GAS, ED
Acid-Base Excess: 3 mmol/L — ABNORMAL HIGH (ref 0.0–2.0)
Bicarbonate: 30.7 mmol/L — ABNORMAL HIGH (ref 20.0–28.0)
Calcium, Ion: 1.11 mmol/L — ABNORMAL LOW (ref 1.15–1.40)
HCT: 43 % (ref 36.0–46.0)
Hemoglobin: 14.6 g/dL (ref 12.0–15.0)
O2 Saturation: 90 %
Potassium: 4.8 mmol/L (ref 3.5–5.1)
Sodium: 133 mmol/L — ABNORMAL LOW (ref 135–145)
TCO2: 32 mmol/L (ref 22–32)
pCO2, Ven: 56.7 mmHg (ref 44.0–60.0)
pH, Ven: 7.341 (ref 7.250–7.430)
pO2, Ven: 63 mmHg — ABNORMAL HIGH (ref 32.0–45.0)

## 2020-05-27 LAB — COMPREHENSIVE METABOLIC PANEL
ALT: 18 U/L (ref 0–44)
AST: 16 U/L (ref 15–41)
Albumin: 3.7 g/dL (ref 3.5–5.0)
Alkaline Phosphatase: 96 U/L (ref 38–126)
Anion gap: 10 (ref 5–15)
BUN: 12 mg/dL (ref 6–20)
CO2: 26 mmol/L (ref 22–32)
Calcium: 8.8 mg/dL — ABNORMAL LOW (ref 8.9–10.3)
Chloride: 95 mmol/L — ABNORMAL LOW (ref 98–111)
Creatinine, Ser: 1.14 mg/dL — ABNORMAL HIGH (ref 0.44–1.00)
GFR, Estimated: >60 mL/min (ref >60)
Glucose, Bld: 290 mg/dL — ABNORMAL HIGH (ref 70–99)
Potassium: 4.5 mmol/L (ref 3.5–5.1)
Sodium: 131 mmol/L — ABNORMAL LOW (ref 135–145)
Total Bilirubin: 0.9 mg/dL (ref 0.3–1.2)
Total Protein: 7.1 g/dL (ref 6.5–8.1)

## 2020-05-27 LAB — I-STAT BETA HCG BLOOD, ED (MC, WL, AP ONLY): I-stat hCG, quantitative: <5 m[IU]/mL (ref <5)

## 2020-05-27 LAB — BETA-HYDROXYBUTYRIC ACID: Beta-Hydroxybutyric Acid: 1.06 mmol/L — ABNORMAL HIGH (ref 0.05–0.27)

## 2020-05-27 LAB — CBG MONITORING, ED: Glucose-Capillary: 246 mg/dL — ABNORMAL HIGH (ref 70–99)

## 2020-05-27 MED ORDER — ONDANSETRON HCL 4 MG/2ML IJ SOLN
4.0000 mg | Freq: Once | INTRAMUSCULAR | Status: AC
  Administered 2020-05-27: 4 mg via INTRAVENOUS
  Filled 2020-05-27: qty 2

## 2020-05-27 MED ORDER — METOCLOPRAMIDE HCL 10 MG PO TABS: 10.0000 mg | ORAL_TABLET | Freq: Four times a day (QID) | ORAL | 0 refills | Status: DC

## 2020-05-27 MED ORDER — SODIUM CHLORIDE 0.9 % IV BOLUS
1000.0000 mL | Freq: Once | INTRAVENOUS | Status: AC
  Administered 2020-05-27: 1000 mL via INTRAVENOUS

## 2020-05-27 MED ORDER — FAMOTIDINE IN NACL 20-0.9 MG/50ML-% IV SOLN
20.0000 mg | Freq: Once | INTRAVENOUS | Status: AC
  Administered 2020-05-27: 20 mg via INTRAVENOUS
  Filled 2020-05-27: qty 50

## 2020-05-27 MED ORDER — METOCLOPRAMIDE HCL 5 MG/ML IJ SOLN
10.0000 mg | Freq: Once | INTRAMUSCULAR | Status: AC
  Administered 2020-05-27: 10 mg via INTRAVENOUS
  Filled 2020-05-27: qty 2

## 2020-05-27 NOTE — ED Triage Notes (Signed)
Pt c/o abdominal cramping, nausea/vomiting/diarrhea that started today. Hx type 1 diabetes, current bs per pt's pump 290.

## 2020-05-27 NOTE — ED Notes (Signed)
Patient verbalizes understanding of discharge instructions. Opportunity for questioning and answers were provided. Armband removed by staff, pt discharged from ED to lobby via wheelchair to go home with family.

## 2020-05-27 NOTE — Discharge Instructions (Signed)
Thank you for allowing me to care for you today in the Emergency Department.   You were seen today for nausea, vomiting, diarrhea, and abdominal pain.  This is most likely consistent with gastroparesis.  He were able to tolerate fluids in the emergency department.  Take 1 tablet of Reglan every 6 hours for abdominal pain, nausea, or vomiting.  Follow-up with your primary care team if you continue to have gastroparesis flares.  Return to the emergency department if you develop uncontrollable pain, uncontrollable vomiting despite taking Reglan, if you stop producing urine, or if you have other new, concerning symptoms.

## 2020-05-27 NOTE — ED Provider Notes (Signed)
[facility] Provider Note   CSN: [id number] Arrival date & time: 05/27/20  0032     History Chief Complaint  Patient presents with  . Emesis    Tracey Perkins is a 35 y.o. female with a history of diabetes mellitus type 1 complicated by gastroparesis, medulloblastoma s/p radiation therapy, GERD, sensorineural hearing loss (hearing better in L>R), chronic lymphocytic thyroiditis who presents the emergency department accompanied by her mother with a chief complaint of diarrhea.  The patient reports sudden onset watery diarrhea that began today.  She has had greater than 10 episodes since onset.  She reports associated nausea and nonbloody, nonbilious vomiting, and generalized abdominal pain.  She states that these episodes were preceded by "stinky burps", which typically indicates that she is having an episode of gastroparesis.  She attempted to manage the symptoms at home and earlier was able to eat saltine cracker and drink fluids, but the frequency of diarrhea persisted.  She did check her ketones at home earlier today and they were moderate, but were low last time that she checked them.  Her mother has been adjusting her insulin pump at home due to not being able to tolerate p.o. intake.  She does report that prior to onset of diarrhea that she had been feeling more constipated over the last few days.  She has been passing flatus without difficulty.  No recent fever, chills, cough, shortness of breath, chest pain, dysuria, flank pain, vaginal discharge.  She is currently on her menstrual cycle.  He does report that she traveled to [place] in March, but in no other recent travel.  No camping.  She was on a course of antibiotics in March after she was seen in the ED while she was traveling for a GI infection, but has had no further courses of antibiotics.  The history is provided by the patient and medical records. No language interpreter was used.        Past Medical History:  Diagnosis Date  . Asthma   . Brain tumor ([place])   . Diabetes mellitus without complication ([place])   . Eczema   . Food allergy   . Gastroparesis   . Hypokalemia   . Hypomagnesemia   . Hypothyroid   . Neuropathy   . Post concussion syndrome 05/2019  . Thyroid disease   . Vision changes     Patient Active Problem List   Diagnosis Date Noted  . Impulsiveness 10/14/2019  . Mild episode of recurrent major depressive disorder ([place]) 09/04/2019  . Anxiety 08/16/2019  . 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  . 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 Status post radiation therapy 11/22/2018  . Seasonal and perennial allergic rhinitis 11/22/2018  . Seasonal allergic conjunctivitis 11/22/2018  . Acute sinusitis 11/22/2018  . Keratosis pilaris 10/05/2017  . Melanocytic nevi of trunk 10/05/2017  . Adverse food reaction 02/09/2017  . Gastroesophageal reflux disease 02/09/2017  . Mild intermittent asthma with acute exacerbation 11/19/2015  . Mild persistent asthma without complication 40/98/1191  . Acute seasonal allergic rhinitis due to pollen 11/16/2015  . Anaphylactic shock due to adverse food reaction 11/16/2015  . 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 Type 1 diabetes mellitus without complication ([place]) 47/82/9562  . Dry mouth 11/16/2015  . Xerosis cutis 11/16/2015  . Nystagmus 08/15/2014  . Visual field defect 08/15/2014  . Disequilibrium 07/25/2014  . 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 BRAIN  SURGERY    . Portacath placement and removed    . WISDOM TOOTH EXTRACTION       OB History   No obstetric history on file.     Family History  Problem Relation Age of Onset  . Thyroid disease Mother   . Eczema Sister   . Thyroid disease Sister   . Thyroid disease Sister     Social History   Tobacco Use  . Smoking status: Never Smoker  . Smokeless tobacco: Never Used  Vaping  Use  . Vaping Use: Never used  Substance Use Topics  . Alcohol use: No  . Drug use: No    Home Medications Prior to Admission medications   Medication Sig Start Date End Date Taking? Authorizing Provider  metoCLOPramide (REGLAN) 10 MG tablet Take 1 tablet (10 mg total) by mouth every 6 (six) hours. 05/27/20  Yes Deltha Bernales A, PA-C  acetaminophen (TYLENOL) 500 MG tablet Take 500 mg by mouth every 6 (six) hours as needed for moderate pain.     [provider]  aspirin EC 81 MG tablet Take 81 mg by mouth daily. Swallow whole.    [provider]  atorvastatin (LIPITOR) 40 MG tablet Take 40 mg by mouth at bedtime. 11/08/19   [provider]  azelastine (ASTELIN) 0.1 % nasal spray Place 2 sprays into both nostrils 2 (two) times daily. 12/25/19   Dara Hoyer, FNP  cetirizine (ZYRTEC) 10 MG tablet Take 1 tablet (10 mg total) by mouth daily. 12/25/19   Dara Hoyer, FNP  citalopram (CELEXA) 10 MG tablet Take 0.5 tablets (5 mg total) by mouth daily. Patient will cut pill in half. She has a traumatic brain injury and will take half the dose. 04/20/20   Salley Slaughter, NP  Continuous Blood Gluc Sensor (FREESTYLE LIBRE SENSOR SYSTEM) MISC USE 1 DEVICE EVERY 10 (TEN) DAYS. 01/29/17   [provider]  Continuous Blood Gluc Transmit (DEXCOM G6 TRANSMITTER) MISC USE 1 EACH EVERY 3 (THREE) MONTHS 06/24/19   [provider]  cyanocobalamin 1000 MCG tablet Take by mouth daily. 12/24/19 12/23/20  [provider]  EPINEPHrine 0.3 mg/0.3 mL IJ SOAJ injection Use as directed for severe allergic reaction. Patient taking differently: Inject 0.3 mg into the muscle as needed for anaphylaxis. Use as directed for severe allergic reaction. 02/19/18   Dara Hoyer, FNP  fluticasone (FLONASE) 50 MCG/ACT nasal spray 2 sprays per nostril daily as needed for stuffy nose. 12/25/19   Dara Hoyer, FNP  fluticasone (FLOVENT HFA) 110 MCG/ACT inhaler Inhale 2 puffs into the lungs  daily. Rinse, gargle and spit out after use. 12/25/19   Dara Hoyer, FNP  gabapentin (NEURONTIN) 300 MG capsule Take 300 mg by mouth at bedtime.  01/25/17   [provider]  glucagon (GLUCAGON EMERGENCY) 1 MG injection Inject 1 mg into the skin once as needed (for diabeties).  Patient not taking: Reported on 12/25/2019 06/30/16   [provider]  Glucagon 3 MG/DOSE POWD as needed.    [provider]  glucose blood test strip Use 3 (three) times daily Use as instructed. 05/11/17   [provider]  insulin aspart (NOVOLOG) 100 UNIT/ML injection USE UP TO 130 UNITS DAILY, AS DIRECTED IN INSULIN PUMP. 12/24/19   [provider]  insulin glargine (LANTUS) 100 UNIT/ML injection Inject 16 Units into the skin daily.  Patient not taking: Reported on 12/25/2019 11/19/15   [provider]  Insulin Human (  INSULIN PUMP) SOLN Inject 80 each into the skin daily. Novolog Insulin Pump    [provider]  KLOR-CON M10 10 MEQ tablet Take 10 mEq by mouth daily. 03/18/19   [provider]  levothyroxine (SYNTHROID, LEVOTHROID) 88 MCG tablet Take 100 mcg by mouth daily before breakfast.     [provider]  lubiprostone (AMITIZA) 8 MCG capsule Take 16 mcg by mouth daily with breakfast.     [provider]  magnesium oxide (MAG-OX) 400 MG tablet Take 800 mg by mouth at bedtime.    [provider]  meclizine (ANTIVERT) 25 MG tablet Take 25 mg by mouth 2 (two) times daily as needed for dizziness.  Patient not taking: Reported on 12/25/2019 06/19/19   [provider]  montelukast (SINGULAIR) 10 MG tablet Take 1 tablet (10 mg total) by mouth at bedtime. To prevent coughing or wheezing 12/25/19   Dara Hoyer, FNP  norgestimate-ethinyl estradiol (ORTHO-CYCLEN) 0.25-35 MG-MCG tablet Take 1 tablet by mouth daily.     [provider]  PROAIR HFA 108 (90 Base) MCG/ACT inhaler TAKE 2 PUFFS BY MOUTH EVERY 4 HOURS AS  NEEDED Patient taking differently: Inhale 2 puffs into the lungs every 6 (six) hours as needed for wheezing or shortness of breath.  04/01/19   Dara Hoyer, FNP    Allergies    Banana, Bactrim [sulfamethoxazole-trimethoprim], Doxycycline, Gentian violet, Gentian violet-proflavine sulfate [triple dye], Mold extract [trichophyton], Morphine and related, Other, and Vancomycin  Review of Systems   Review of Systems  Constitutional: Negative for activity change, chills and fever.  HENT: Negative for congestion and sore throat.   Eyes: Negative for visual disturbance.  Respiratory: Negative for shortness of breath.   Cardiovascular: Negative for chest pain.  Gastrointestinal: Positive for abdominal pain, diarrhea, nausea and vomiting. Negative for blood in stool and constipation.  Genitourinary: Negative for dysuria.  Musculoskeletal: Negative for back pain, myalgias, neck pain and neck stiffness.  Skin: Negative for rash and wound.  Allergic/Immunologic: Negative for immunocompromised state.  Neurological: Negative for dizziness, seizures, syncope, weakness, numbness and headaches.  Psychiatric/Behavioral: Negative for confusion.    Physical Exam Updated Vital Signs BP (!) 100/58   Pulse (!) 102   Temp 98 F (36.7 C) (Oral)   Resp 16   SpO2 99%   Physical Exam Vitals and nursing note reviewed.  Constitutional:      General: She is not in acute distress.    Appearance: She is not ill-appearing, toxic-appearing or diaphoretic.  HENT:     Head: Normocephalic.  Eyes:     Conjunctiva/sclera: Conjunctivae normal.  Cardiovascular:     Rate and Rhythm: Regular rhythm. Tachycardia present.     Heart sounds: No murmur heard. No friction rub. No gallop.   Pulmonary:     Effort: Pulmonary effort is normal. No respiratory distress.     Breath sounds: No stridor. No wheezing, rhonchi or rales.  Chest:     Chest wall: No tenderness.  Abdominal:     General: There is no distension.      Palpations: Abdomen is soft. There is no mass.     Tenderness: There is abdominal tenderness. There is no right CVA tenderness, left CVA tenderness, guarding or rebound.     Hernia: No hernia is present.     Comments: Mild generalized tenderness to palpation.  She does have more focal tenderness of the left lower quadrant without rebound or guarding.  Hypoactive bowel sounds in all 4  quadrants.  No tenderness over McBurney's point.  Negative Murphy sign.  No CVA tenderness bilaterally.  Musculoskeletal:     Cervical back: Neck supple.  Skin:    General: Skin is warm.     Findings: No rash.  Neurological:     Mental Status: She is alert.  Psychiatric:        Behavior: Behavior normal.     ED Results / Procedures / Treatments   Labs (all labs ordered are listed, but only abnormal results are displayed) Labs Reviewed  COMPREHENSIVE METABOLIC PANEL - Abnormal; Notable for the following components:      Result Value   Sodium 131 (*)    Chloride 95 (*)    Glucose, Bld 290 (*)    Creatinine, Ser 1.14 (*)    Calcium 8.8 (*)    All other components within normal limits  BETA-HYDROXYBUTYRIC ACID - Abnormal; Notable for the following components:   Beta-Hydroxybutyric Acid 1.06 (*)    All other components within normal limits  CBC WITH DIFFERENTIAL/PLATELET - Abnormal; Notable for the following components:   Platelets 433 (*)    All other components within normal limits  CBG MONITORING, ED - Abnormal; Notable for the following components:   Glucose-Capillary 246 (*)    All other components within normal limits  I-STAT VENOUS BLOOD GAS, ED - Abnormal; Notable for the following components:   pO2, Ven 63.0 (*)    Bicarbonate 30.7 (*)    Acid-Base Excess 3.0 (*)    Sodium 133 (*)    Calcium, Ion 1.11 (*)    All other components within normal limits  CK  I-STAT BETA HCG BLOOD, ED ([facility], [facility], [facility] ONLY)  CBG MONITORING, ED    EKG None  Radiology No results  found.  Procedures Procedures   Medications Ordered in ED Medications  sodium chloride 0.9 % bolus 1,000 mL (0 mLs Intravenous Stopped 05/27/20 0244)  ondansetron (ZOFRAN) injection 4 mg (4 mg Intravenous Given 05/27/20 0128)  famotidine (PEPCID) IVPB 20 mg premix (0 mg Intravenous Stopped 05/27/20 0244)  metoCLOPramide (REGLAN) injection 10 mg (10 mg Intravenous Given 05/27/20 0229)  sodium chloride 0.9 % bolus 1,000 mL (1,000 mLs Intravenous New Bag/Given 05/27/20 0411)    ED Course  I have reviewed the triage vital signs and the nursing notes.  Pertinent labs & imaging results that were available during my care of the patient were reviewed by me and considered in my medical decision making (see chart for details).  Clinical Course as of 05/27/20 0508  Wed May 27, 2020  0313 Patient rechecked.  She is sleeping comfortably and in no acute distress.  Patient's mother reports that she was able to void without having another episode of diarrhea.  She still has bounding pulses with a heart rate greater than 100.  Will give second bag of IV fluids and fluid challenge the patient.  Anticipate discharge to home. [MM]    Clinical Course User Index [MM] Rjay Revolorio, Laymond Purser, PA-C   MDM Rules/Calculators/A&P                           35 year old female with a history of diabetes mellitus type 1 complicated by gastroparesis, medulloblastoma s/p radiation therapy, GERD, sensorineural hearing loss (hearing better in L>R), chronic lymphocytic thyroiditis who presents to the emergency department accompanied by her mother with a 1 day history of greater than 10 episodes of watery diarrhea, nausea, vomiting, and abdominal pain.  No constitutional symptoms.  Tachycardic on arrival.  Afebrile.  Vital signs are otherwise unremarkable.  She does not have a surgical abdomen.  Will order labs, IV fluids, Zofran, and Pepcid.  Labs have been reviewed and independently interpreted by me.  Glucose is 290.  Anion gap  and bicarb are normal.  No significant metabolic derangements.  pH is reassuring.  Labs are not consistent with DKA or HHS.  I am more suspicious for gastroparesis flare.  Doubt appendicitis, cholecystitis, ovarian torsion, bowel obstruction, pancreatitis.  On reevaluation, patient is feeling much improved.  She has been successfully fluid challenged.  Tachycardia has resolved.  At this time, she is hemodynamically stable and in no acute distress.  Safe for discharge to home with outpatient follow-up.  Final Clinical Impression(s) / ED Diagnoses Final diagnoses:  Gastroparesis  Nausea vomiting and diarrhea    Rx / DC Orders ED Discharge Orders         Ordered    metoCLOPramide (REGLAN) 10 MG tablet  Every 6 hours        05/27/20 0503           Esmeralda Blanford, Maree Erie A, PA-C 05/27/20 0510    Wyvonnia Dusky, MD 05/27/20 1443

## 2020-06-01 ENCOUNTER — Other Ambulatory Visit: Payer: Self-pay

## 2020-06-01 ENCOUNTER — Ambulatory Visit: Payer: Medicaid Other | Attending: Neurology | Admitting: Physical Therapy

## 2020-06-01 DIAGNOSIS — R2681 Unsteadiness on feet: Secondary | ICD-10-CM | POA: Diagnosis present

## 2020-06-01 DIAGNOSIS — M6281 Muscle weakness (generalized): Secondary | ICD-10-CM | POA: Insufficient documentation

## 2020-06-01 DIAGNOSIS — R2689 Other abnormalities of gait and mobility: Secondary | ICD-10-CM | POA: Diagnosis not present

## 2020-06-01 DIAGNOSIS — R296 Repeated falls: Secondary | ICD-10-CM | POA: Insufficient documentation

## 2020-06-01 NOTE — Patient Instructions (Signed)
Access Code: [email or web address] URL: [email or web address] Date: 06/01/2020 Prepared by: Nita Sells  Exercises Forward Walking - 1 x daily - 2 x weekly - 4 sets Backward Walking - 1 x daily - 2 x weekly - 4 sets Forward March - 1 x daily - 2 x weekly - 4 sets Backward March - 1 x daily - 2 x weekly - 4 sets Forward March with Opposite Arm Knee Taps and Hand Floats - 1 x daily - 2 x weekly - 4 sets Backward March with Opposite Arm Knee Taps and Hand Floats - 1 x daily - 2 x weekly - 4 sets Bilateral Shoulder Horizontal Abduction Adduction AROM - 1 x daily - 2 x weekly - 4 sets Standing March at [facility] - 1 x daily - 2 x weekly - 15 reps Standing Hip Flexion Extension at [employer] - 1 x daily - 2 x weekly - 15 reps Standing Hip Abduction Adduction at [employer] - 1 x daily - 2 x weekly - 15 reps Squat - 1 x daily - 2 x weekly - 15 reps Single Leg Squat - 1 x daily - 2 x weekly - 15 reps

## 2020-06-01 NOTE — Therapy (Signed)
[facility] [address] [place], [name], [address] Phone: [phone]   Fax:  [phone]  Physical Therapy Treatment  Patient Details  Name: Tracey Perkins MRN: [id number] Date of Birth: 05-13-1985 Referring Provider (PT): Erin Sons, MD   Encounter Date: 06/01/2020   PT End of Session - 06/01/20 1453    Visit Number 36    Number of Visits 41    Date for PT Re-Evaluation 07/21/20    Authorization Type Approved 12 visits from 03/24/20 > 07/21/20    Authorization - Visit Number 7    Authorization - Number of Visits 12    PT Start Time 1401    PT Stop Time 1445    PT Time Calculation (min) 44 min    Activity Tolerance Patient tolerated treatment well    Behavior During Therapy [facility] for tasks assessed/performed           Past Medical History:  Diagnosis Date  . Asthma   . Brain tumor ([place])   . Diabetes mellitus without complication ([place])   . Eczema   . Food allergy   . Gastroparesis   . Hypokalemia   . Hypomagnesemia   . Hypothyroid   . Neuropathy   . Post concussion syndrome 05/2019  . Thyroid disease   . Vision changes     Past Surgical History:  Procedure Laterality Date  . BRAIN SURGERY    . Portacath placement and removed    . WISDOM TOOTH EXTRACTION      There were no vitals filed for this visit.   Subjective Assessment - 06/01/20 1410    Subjective Finished with aquatic therapy; has seen audiology and neurology since last PT session.  Was able to get order for rollator and will be getting hearing aide for L ear.  Had to go to ED last week for gastroparesis, is feeling a little better but eating mainly soft diet.  Was able to get rollator, brought it in today.    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    Currently in Pain? Yes   stomach                            [facility] Adult PT Treatment/Exercise - 06/01/20 1458      Transfers   Transfers Sit to Stand;Stand to Sit    Sit to Stand 5: Supervision    Sit to Stand Details (indicate cue type and reason) cues to lock brakes on rollator prior to sitting    Stand to Sit 5: Supervision    Stand to Sit Details cues to keep brakes locked while seated on rollator to prevent rollator from sliding when transitioning from stand > sit      Ambulation/Gait   Ambulation/Gait Yes    Ambulation/Gait Assistance 5: Supervision    Ambulation/Gait Assistance Details Set up rollator handles to appropriate height for patient.  Performed gait with new rollator with pt demonstrating improved control of side to side movement of rollator.  While ambulating provided random cues of stop and lock or slow  down to have pt practice setting brakes in place or squeezing to slow rollator down.  Also performed obstacle negotiation, weaving L and R around cones set 4 feet apart with cues to turn head and attend to obstacles to avoid hitting with wheels when turning.    Ambulation Distance (Feet) 230 Feet    Assistive device Rollator    Gait Pattern Step-through pattern;Decreased stride length;Trunk flexed;Decreased trunk rotation;Decreased hip/knee flexion - right;Decreased hip/knee flexion - left;Poor foot clearance - left    Ambulation Surface Level;Indoor    Ramp 4: Min assist    Ramp Details (indicate cue type and reason) indoor ramp; continued to require cues for full step length when ascending and use of brakes to slow descent when going downhill; performed x 2    Curb 4: Min assist    Curb Details  (indicate cue type and reason) performed x 3 with therapist providing constant verbal cues to sequence safe ascending and descending curb with rollator.  Mother observed to learn safe sequence and how to cue pt, "4 wheels stable, lock, step."      Therapeutic Activites    Therapeutic Activities Other Therapeutic Activities    Other Therapeutic Activities discussed plan for therapy visits and adding 5 more visits.  Attempted to print off aquatic HEP but code not included in previous note.  Will reach out to aquatic PT for code and will provide at next session                  PT Education - 06/01/20 1503    Education Details plan for visits, gait with new rollator, aquatic HEP    Person(s) Educated Patient;Parent(s)    Methods Explanation    Comprehension Verbalized understanding            PT Short Term Goals - 03/16/20 1223      PT SHORT TERM GOAL #1   Title = LTG             PT Long Term Goals - 06/01/20 1454      PT LONG TERM GOAL #1   Title Patient will demonstrate ability to perform final land and aquatic HEP independently at home and at gym for ongoing wellness.    Baseline is performing HEP and treadmill; has joined a gym but not going consistently    Time 8    Period Weeks    Status Revised    Target Date 07/21/20      PT LONG TERM GOAL #2   Title Pt will improve DGI without AD to >/= 22/24 points to indicate decreased falls risk    Baseline 19/24    Time 8    Period Weeks    Status Revised    Target Date 07/21/20      PT LONG TERM GOAL #3   Title Pt will demonstrate ability to perform MCTSIB condition 3 for 15 seconds and condition 4 for up to 10 seconds to indicate improved sensory integration    Baseline Condition 3: 5 seconds; Condition 4: unable    Time 8    Period Weeks    Status Revised    Target Date 07/21/20      PT LONG TERM GOAL #4   Title Pt will report no dizziness with vertical head movements, bending down to the floor, supine <>  sit and rolling in bed in order to decrease falls risk with ADL and mobility    Baseline dizziness with vertical head movements, sit >  supine and rolling to R but pt reporting aural fullness and issues with allergies    Time 8    Period Weeks    Status Revised    Target Date 07/21/20                 Plan - 06/01/20 1455    Clinical Impression Statement Pt received new rollator; treatment session focused on set up and review of gait training and safety with new rollator.  Demonstrated to mother how to perform curb negotiation safely as pt will require close supervision/min guard and cues to perform safely.  Added 5 more visits and attempted to provide pt with aquatic HEP but code not available; will reach out to aquatic therapist for HEP to provide next session.    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  Continue to work on trunk rotation, descending stairs.  Land: gait with rollator with higher level challenges/outside, habituation to vertical head movements, supine <> sit, rolling R.  Picking up objects from floor.  Balance on compliant surfaces and with vision removed.  Descending stairs.  Continue to progress treadmill training; higher level balance and vestibular training    PT [place] - 03-24-20    Consulted and Agree with Plan of Care Patient;Family member/caregiver    Family Member Consulted Mother           Patient will benefit from skilled therapeutic intervention in order to improve the following deficits and impairments:  Abnormal gait,Decreased coordination,Difficulty walking,Dizziness,Decreased activity tolerance,Decreased balance,Decreased strength,Impaired sensation,Pain  Visit Diagnosis: Other abnormalities of gait and mobility  Muscle weakness (generalized)  Unsteadiness on feet  Repeated falls     Problem List Patient Active Problem List   Diagnosis Date Noted  . Impulsiveness 10/14/2019  . Mild episode of recurrent major depressive disorder ([place]) 09/04/2019  . Anxiety 08/16/2019  . 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  . 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 Status post radiation therapy 11/22/2018  . Seasonal and perennial allergic rhinitis 11/22/2018  . Seasonal allergic conjunctivitis 11/22/2018  . Acute sinusitis 11/22/2018  . Keratosis pilaris 10/05/2017  . Melanocytic nevi of trunk 10/05/2017  . Adverse food reaction 02/09/2017  . Gastroesophageal reflux disease 02/09/2017  . Mild intermittent asthma with acute exacerbation 11/19/2015  . Mild persistent asthma without complication 24/58/0998  . Acute seasonal allergic rhinitis due to pollen 11/16/2015  . Anaphylactic shock due to adverse food reaction 11/16/2015  . 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 Type 1 diabetes mellitus without complication ([place]) 33/82/5053  . Dry mouth 11/16/2015  . Xerosis cutis 11/16/2015  . Nystagmus 08/15/2014  . Visual field defect 08/15/2014  . Disequilibrium 07/25/2014  . 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    Rico Junker, PT, DPT 06/01/20    3:04 PM    [facility] [address] [place], [name],  [address] Phone: [phone]   Fax:  [phone]  Name: Tracey Perkins MRN: [id number] Date of Birth: 15-Apr-1985

## 2020-06-03 ENCOUNTER — Emergency Department (HOSPITAL_COMMUNITY): Payer: Medicaid Other

## 2020-06-03 ENCOUNTER — Encounter (HOSPITAL_COMMUNITY): Payer: Self-pay | Admitting: Emergency Medicine

## 2020-06-03 ENCOUNTER — Emergency Department (HOSPITAL_COMMUNITY)
Admission: EM | Admit: 2020-06-03 | Discharge: 2020-06-03 | Disposition: A | Discharge status: Home / Self Care | Payer: Medicaid Other | Attending: Emergency Medicine | Admitting: Emergency Medicine

## 2020-06-03 DIAGNOSIS — R197 Diarrhea, unspecified: Secondary | ICD-10-CM | POA: Diagnosis not present

## 2020-06-03 DIAGNOSIS — Z79899 Other long term (current) drug therapy: Secondary | ICD-10-CM | POA: Insufficient documentation

## 2020-06-03 DIAGNOSIS — Z881 Allergy status to other antibiotic agents status: Secondary | ICD-10-CM | POA: Diagnosis not present

## 2020-06-03 DIAGNOSIS — E039 Hypothyroidism, unspecified: Secondary | ICD-10-CM | POA: Diagnosis not present

## 2020-06-03 DIAGNOSIS — Z85841 Personal history of malignant neoplasm of brain: Secondary | ICD-10-CM | POA: Diagnosis not present

## 2020-06-03 DIAGNOSIS — Z9641 Presence of insulin pump (external) (internal): Secondary | ICD-10-CM | POA: Insufficient documentation

## 2020-06-03 DIAGNOSIS — K3184 Gastroparesis: Secondary | ICD-10-CM | POA: Insufficient documentation

## 2020-06-03 DIAGNOSIS — K760 Fatty (change of) liver, not elsewhere classified: Secondary | ICD-10-CM | POA: Insufficient documentation

## 2020-06-03 DIAGNOSIS — Z7951 Long term (current) use of inhaled steroids: Secondary | ICD-10-CM | POA: Diagnosis not present

## 2020-06-03 DIAGNOSIS — Z28311 Partially vaccinated for covid-19: Secondary | ICD-10-CM | POA: Diagnosis not present

## 2020-06-03 DIAGNOSIS — R10819 Abdominal tenderness, unspecified site: Secondary | ICD-10-CM | POA: Insufficient documentation

## 2020-06-03 DIAGNOSIS — Z885 Allergy status to narcotic agent status: Secondary | ICD-10-CM | POA: Insufficient documentation

## 2020-06-03 DIAGNOSIS — R509 Fever, unspecified: Secondary | ICD-10-CM | POA: Insufficient documentation

## 2020-06-03 DIAGNOSIS — E1043 Type 1 diabetes mellitus with diabetic autonomic (poly)neuropathy: Secondary | ICD-10-CM | POA: Diagnosis not present

## 2020-06-03 DIAGNOSIS — Z794 Long term (current) use of insulin: Secondary | ICD-10-CM | POA: Diagnosis not present

## 2020-06-03 DIAGNOSIS — K8020 Calculus of gallbladder without cholecystitis without obstruction: Secondary | ICD-10-CM | POA: Insufficient documentation

## 2020-06-03 DIAGNOSIS — Z7982 Long term (current) use of aspirin: Secondary | ICD-10-CM | POA: Insufficient documentation

## 2020-06-03 DIAGNOSIS — Z20822 Contact with and (suspected) exposure to covid-19: Secondary | ICD-10-CM | POA: Diagnosis not present

## 2020-06-03 DIAGNOSIS — R112 Nausea with vomiting, unspecified: Secondary | ICD-10-CM | POA: Diagnosis present

## 2020-06-03 DIAGNOSIS — Z79890 Hormone replacement therapy (postmenopausal): Secondary | ICD-10-CM | POA: Insufficient documentation

## 2020-06-03 LAB — CBC WITH DIFFERENTIAL/PLATELET
Abs Immature Granulocytes: 0.07 10*3/uL (ref 0.00–0.07)
Basophils Absolute: 0.1 10*3/uL (ref 0.0–0.1)
Basophils Relative: 1 %
Eosinophils Absolute: 0.1 10*3/uL (ref 0.0–0.5)
Eosinophils Relative: 1 %
HCT: 45.9 % (ref 36.0–46.0)
Hemoglobin: 14.6 g/dL (ref 12.0–15.0)
Immature Granulocytes: 1 %
Lymphocytes Relative: 16 %
Lymphs Abs: 2.2 10*3/uL (ref 0.7–4.0)
MCH: 30.2 pg (ref 26.0–34.0)
MCHC: 31.8 g/dL (ref 30.0–36.0)
MCV: 94.8 fL (ref 80.0–100.0)
Monocytes Absolute: 1.2 10*3/uL — ABNORMAL HIGH (ref 0.1–1.0)
Monocytes Relative: 9 %
Neutro Abs: 10.3 10*3/uL — ABNORMAL HIGH (ref 1.7–7.7)
Neutrophils Relative %: 72 %
Platelets: 407 10*3/uL — ABNORMAL HIGH (ref 150–400)
RBC: 4.84 MIL/uL (ref 3.87–5.11)
RDW: 13.5 % (ref 11.5–15.5)
WBC: 14 10*3/uL — ABNORMAL HIGH (ref 4.0–10.5)
nRBC: 0 % (ref 0.0–0.2)

## 2020-06-03 LAB — COMPREHENSIVE METABOLIC PANEL
ALT: 18 U/L (ref 0–44)
AST: 22 U/L (ref 15–41)
Albumin: 3.9 g/dL (ref 3.5–5.0)
Alkaline Phosphatase: 112 U/L (ref 38–126)
Anion gap: 14 (ref 5–15)
BUN: 11 mg/dL (ref 6–20)
CO2: 23 mmol/L (ref 22–32)
Calcium: 9.3 mg/dL (ref 8.9–10.3)
Chloride: 98 mmol/L (ref 98–111)
Creatinine, Ser: 1.16 mg/dL — ABNORMAL HIGH (ref 0.44–1.00)
GFR, Estimated: >60 mL/min (ref >60)
Glucose, Bld: 193 mg/dL — ABNORMAL HIGH (ref 70–99)
Potassium: 4 mmol/L (ref 3.5–5.1)
Sodium: 135 mmol/L (ref 135–145)
Total Bilirubin: 0.4 mg/dL (ref 0.3–1.2)
Total Protein: 7.9 g/dL (ref 6.5–8.1)

## 2020-06-03 LAB — URINALYSIS, ROUTINE W REFLEX MICROSCOPIC
Bacteria, UA: NONE SEEN
Bilirubin Urine: NEGATIVE
Glucose, UA: 150 mg/dL — AB
Ketones, ur: 20 mg/dL — AB
Leukocytes,Ua: NEGATIVE
Nitrite: NEGATIVE
Protein, ur: NEGATIVE mg/dL
Specific Gravity, Urine: 1.011 (ref 1.005–1.030)
pH: 6 (ref 5.0–8.0)

## 2020-06-03 LAB — CBG MONITORING, ED: Glucose-Capillary: 263 mg/dL — ABNORMAL HIGH (ref 70–99)

## 2020-06-03 LAB — I-STAT CHEM 8, ED
BUN: 13 mg/dL (ref 6–20)
Calcium, Ion: 1.05 mmol/L — ABNORMAL LOW (ref 1.15–1.40)
Chloride: 99 mmol/L (ref 98–111)
Creatinine, Ser: 1 mg/dL (ref 0.44–1.00)
Glucose, Bld: 198 mg/dL — ABNORMAL HIGH (ref 70–99)
HCT: 47 % — ABNORMAL HIGH (ref 36.0–46.0)
Hemoglobin: 16 g/dL — ABNORMAL HIGH (ref 12.0–15.0)
Potassium: 3.9 mmol/L (ref 3.5–5.1)
Sodium: 134 mmol/L — ABNORMAL LOW (ref 135–145)
TCO2: 26 mmol/L (ref 22–32)

## 2020-06-03 LAB — RESP PANEL BY RT-PCR (FLU A&B, COVID) ARPGX2
Influenza A by PCR: NEGATIVE
Influenza B by PCR: NEGATIVE
SARS Coronavirus 2 by RT PCR: NEGATIVE

## 2020-06-03 LAB — C DIFFICILE QUICK SCREEN W PCR REFLEX
C Diff antigen: NEGATIVE
C Diff interpretation: NOT DETECTED
C Diff toxin: NEGATIVE

## 2020-06-03 LAB — I-STAT BETA HCG BLOOD, ED (MC, WL, AP ONLY): I-stat hCG, quantitative: <5 m[IU]/mL (ref <5)

## 2020-06-03 LAB — LIPASE, BLOOD: Lipase: 44 U/L (ref 11–51)

## 2020-06-03 LAB — LACTIC ACID, PLASMA: Lactic Acid, Venous: 1.9 mmol/L (ref 0.5–1.9)

## 2020-06-03 IMAGING — US US ABDOMEN LIMITED
1 series · 14 of 25 positions shown · non-contrast
Comparison: CT from earlier in the same day.

CLINICAL DATA: Right upper quadrant pain for 1 week

EXAM:
ULTRASOUND ABDOMEN LIMITED RIGHT UPPER QUADRANT

[Series 1: us abdomen limited · 14 of 34 slices shown]
[im 1/34]
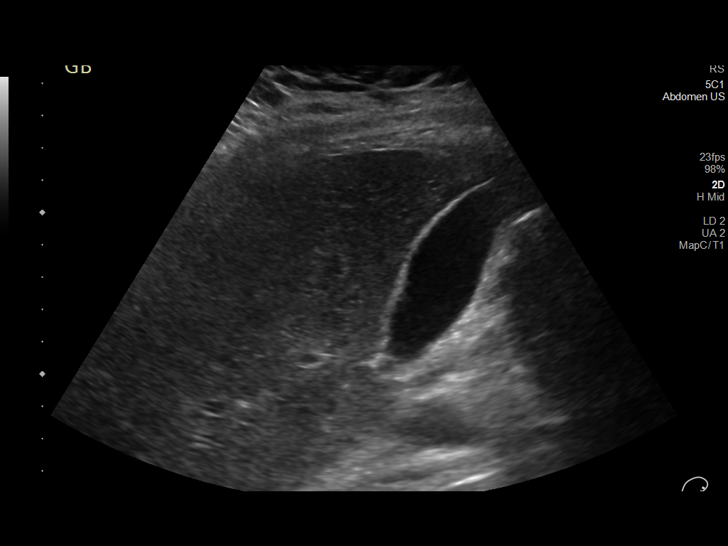
[im 3/34]
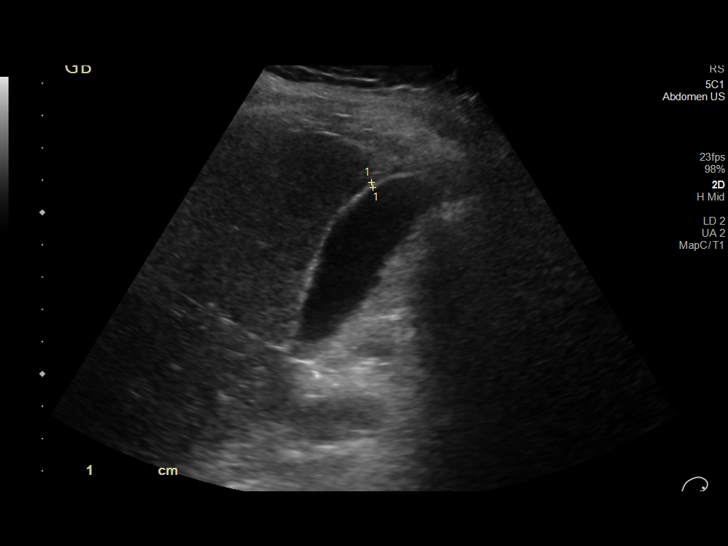
[im 6/34]
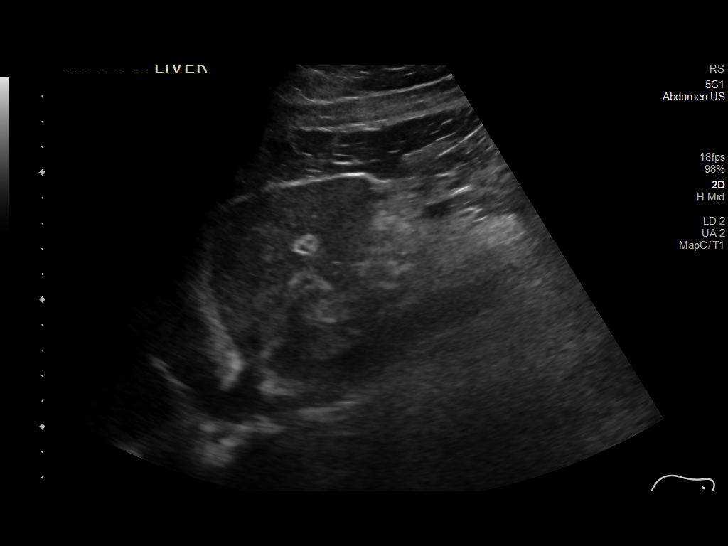
[im 9/34]
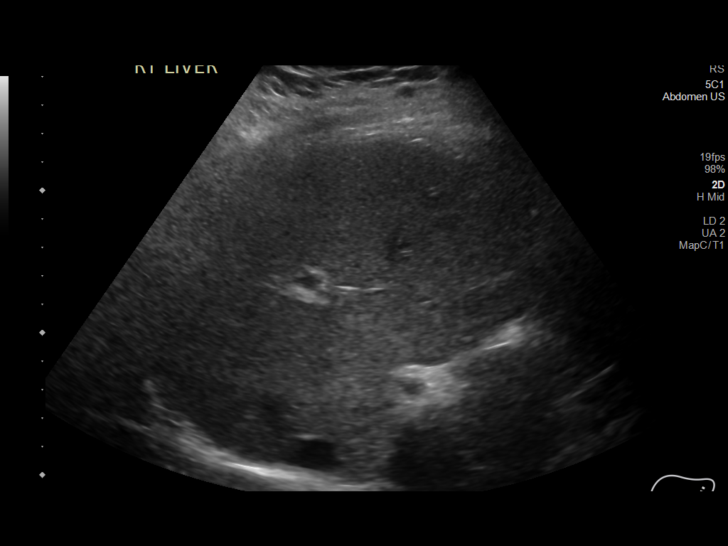
[im 12/34]
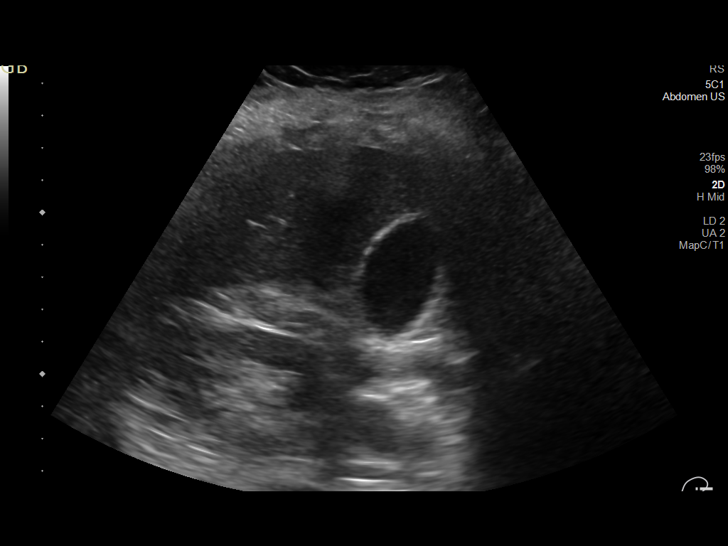
[im 13/34]
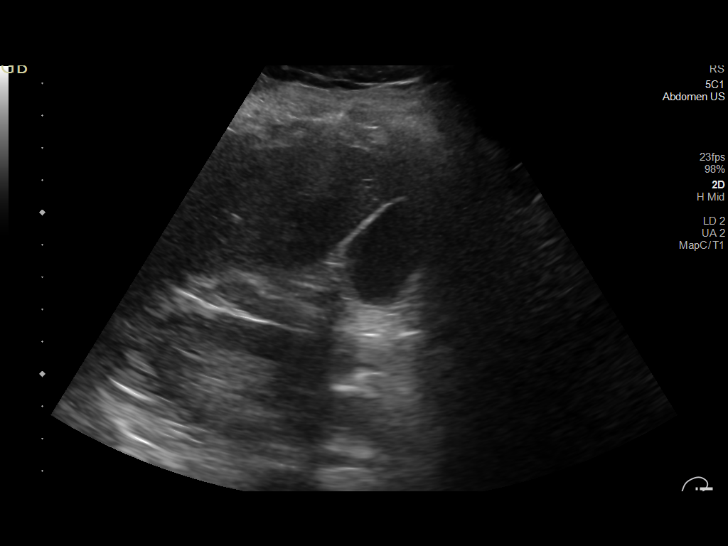
[im 16/34]
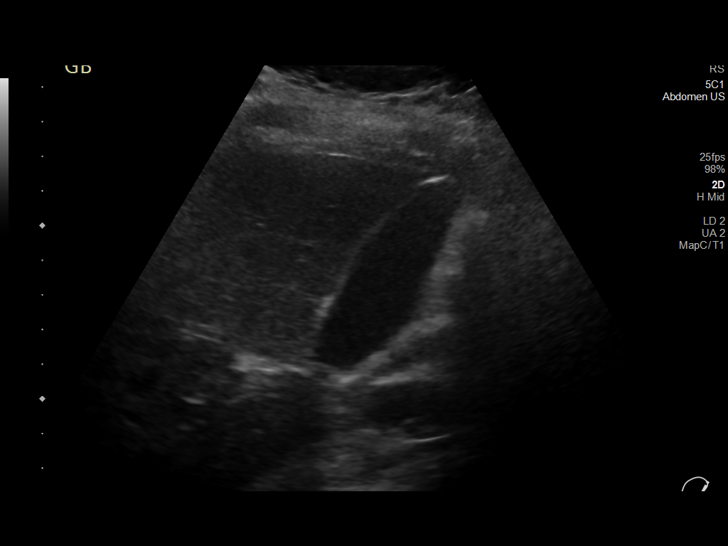
[im 18/34]
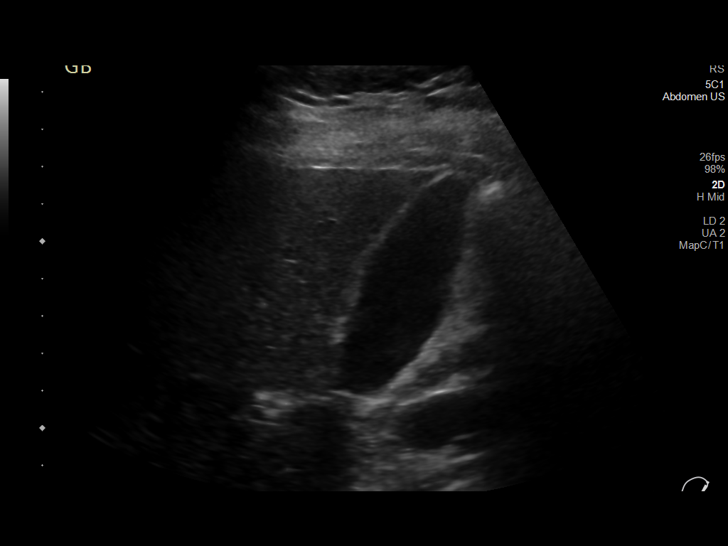
[im 21/34]
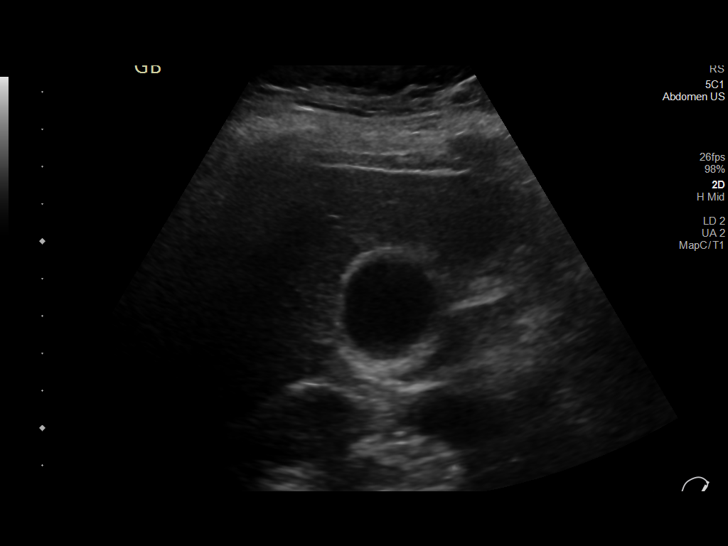
[im 23/34]
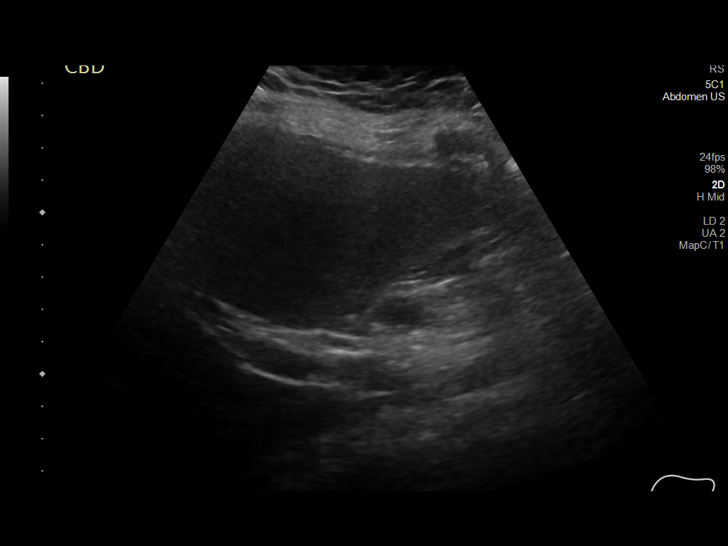
[im 25/34]
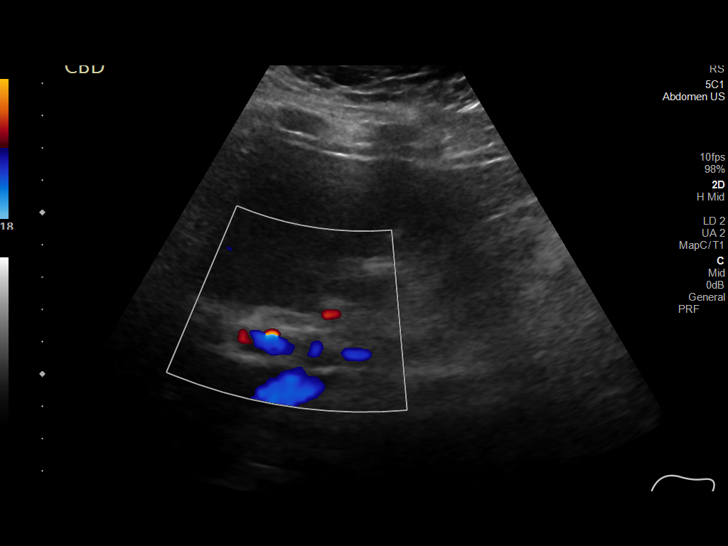
[im 28/34]
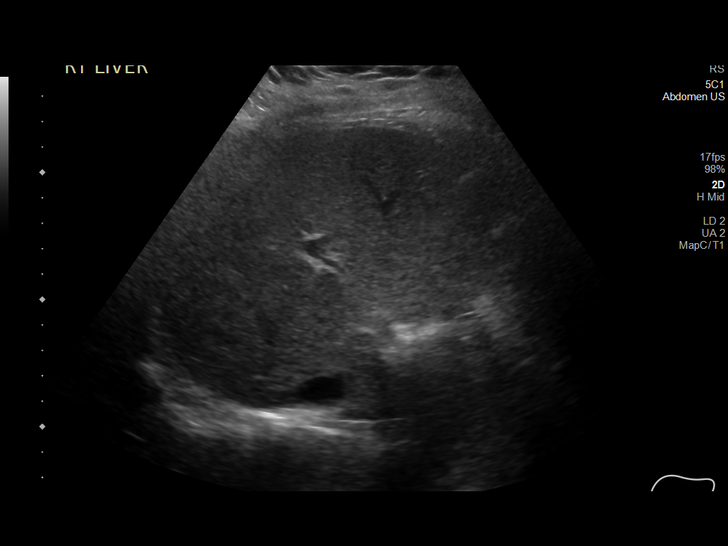
[im 31/34]
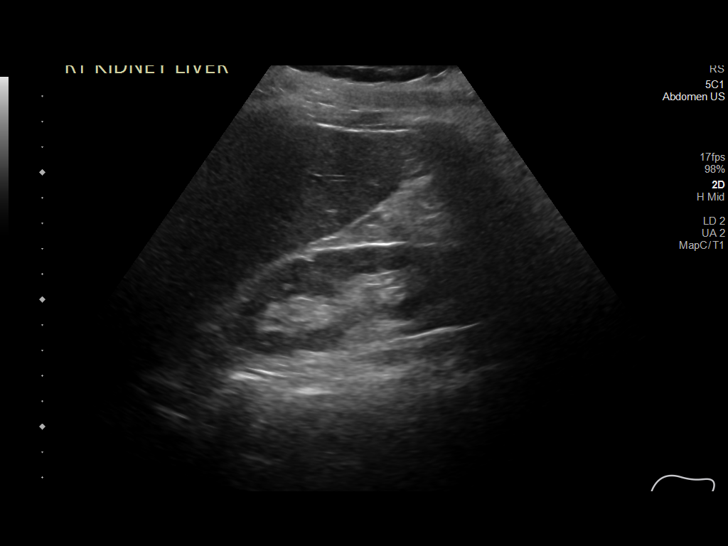
[im 34/34]
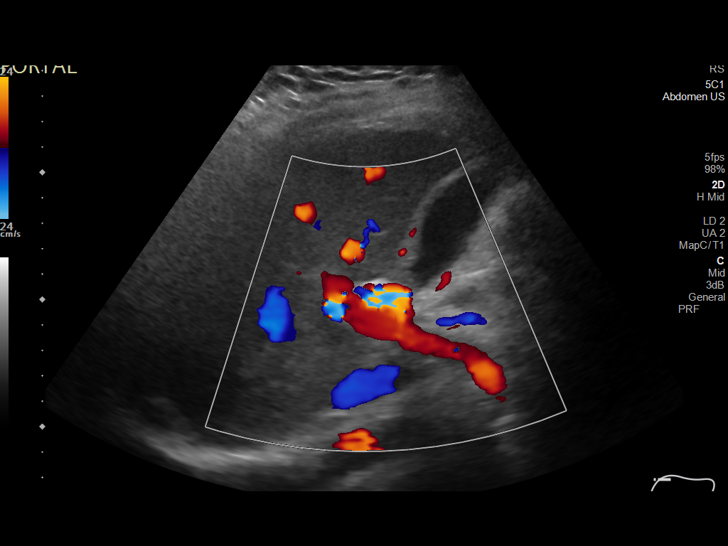

[14 of 25 positions shown; findings below may reference images not displayed]

FINDINGS: Gallbladder:

No gallstones or wall thickening visualized. No sonographic Murphy
sign noted by sonographer.

Common bile duct:

Diameter: 3.7 mm.

Liver:

Mild increased in echogenicity consistent with fatty infiltration.
No focal mass is noted. Portal vein is patent on color Doppler
imaging with normal direction of blood flow towards the liver.

Other: None.
IMPRESSION: Changes seen on recent CT do not represent cholelithiasis. The
gallbladder is within normal limits.

Mild fatty infiltration of the liver.

## 2020-06-03 IMAGING — CT CT ABD-PELV W/O CM
2 of 4 series · 16 of 46 positions shown, 18 images · non-contrast
Comparison: [DATE]

CLINICAL DATA: Abdominal pain with vomiting and diarrhea.

EXAM:
CT ABDOMEN AND PELVIS WITHOUT CONTRAST
TECHNIQUE: Multidetector CT imaging of the abdomen and pelvis was performed
following the standard protocol without IV contrast.

[Series 3: ap without · axial · non-contrast · 0.73mm/px · z∈[+820,+1285]mm · 13 of 105 slices shown, 15 images]
[im 6/105  soft-tissue]
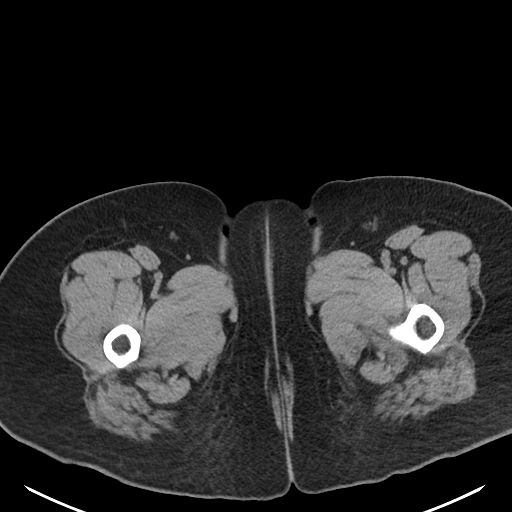
[im 6/105  bone]
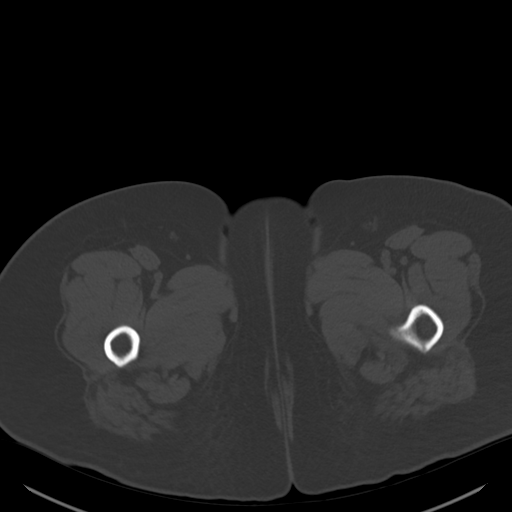
[im 17/105  soft-tissue]
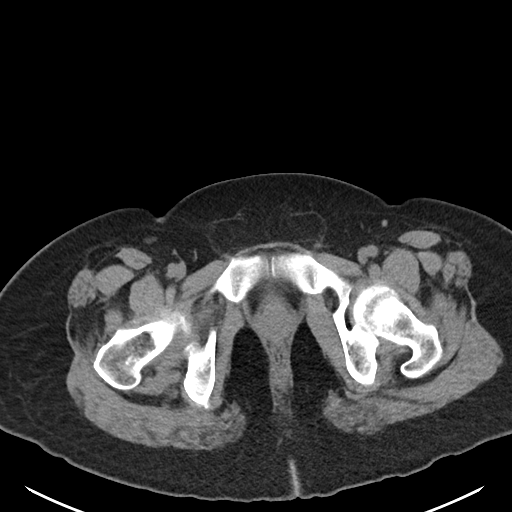
[im 22/105  soft-tissue]
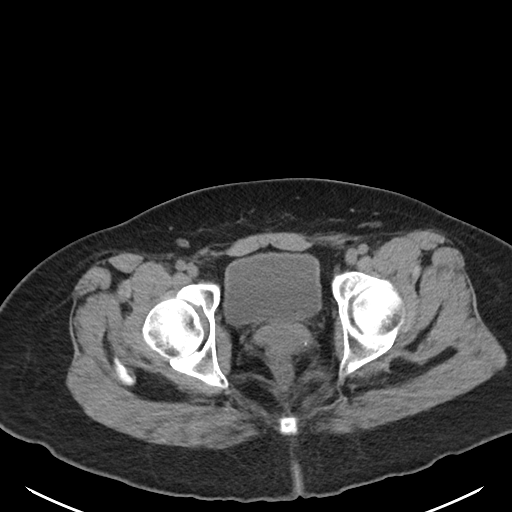
[im 28/105  soft-tissue]
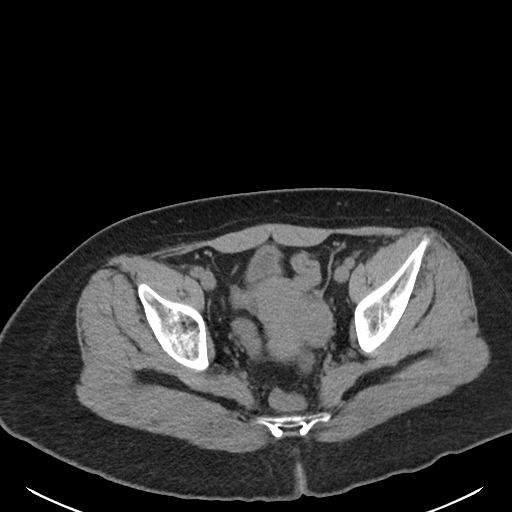
[im 39/105  soft-tissue]
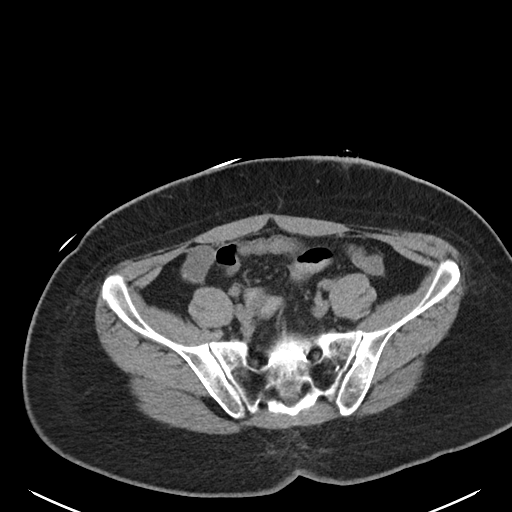
[im 44/105  soft-tissue]
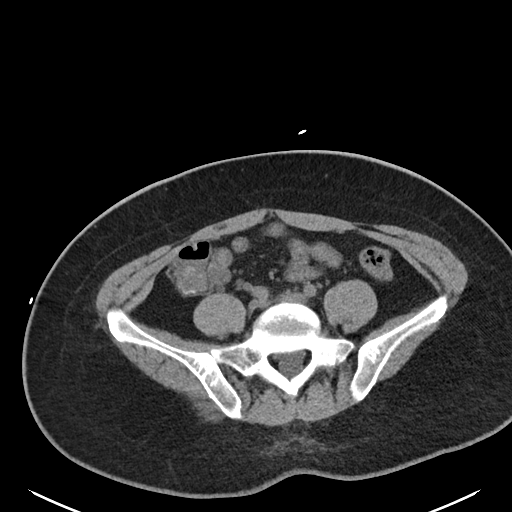
[im 55/105  soft-tissue]
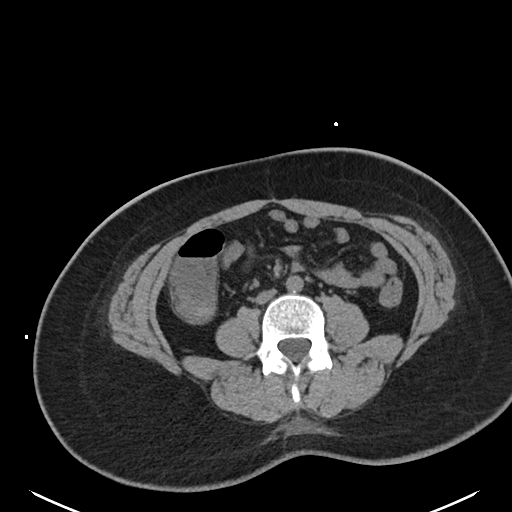
[im 61/105  soft-tissue]
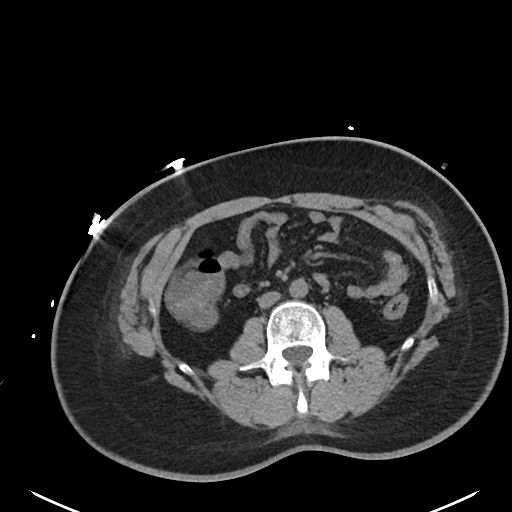
[im 66/105  soft-tissue]
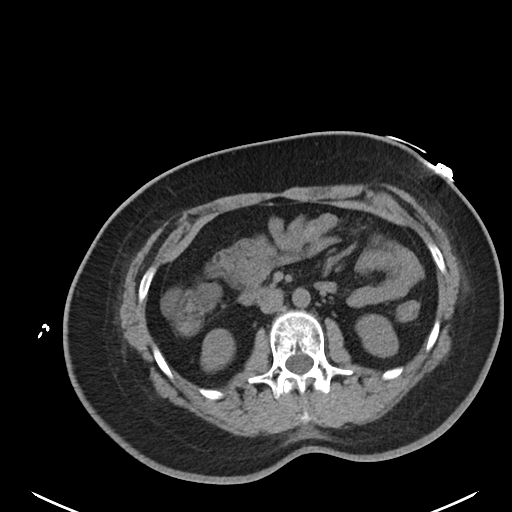
[im 66/105  bone]
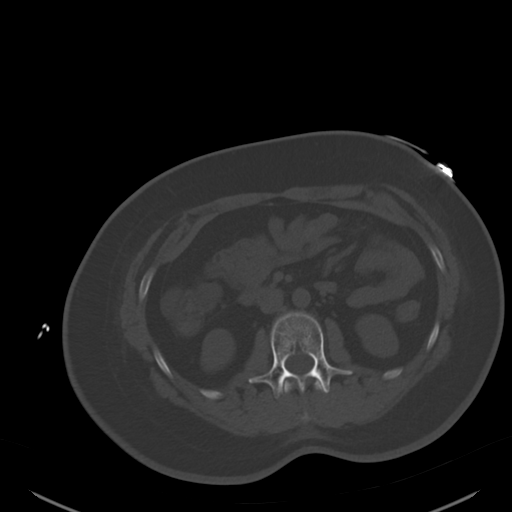
[im 77/105  soft-tissue]
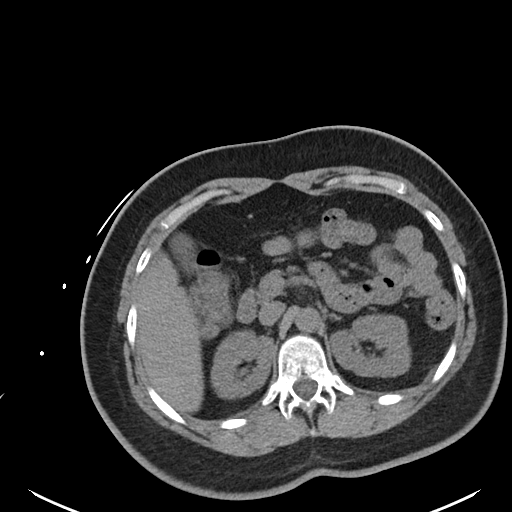
[im 83/105  soft-tissue]
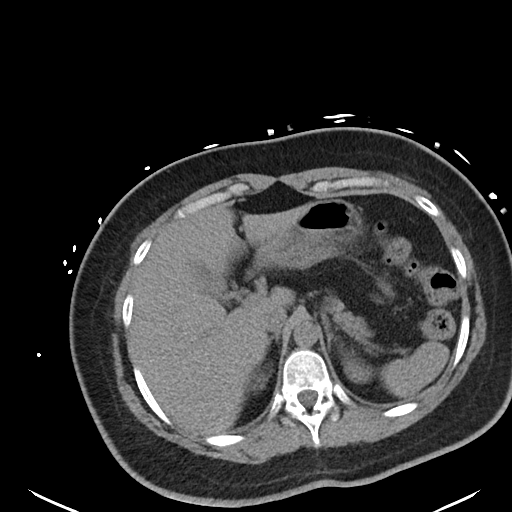
[im 88/105  soft-tissue]
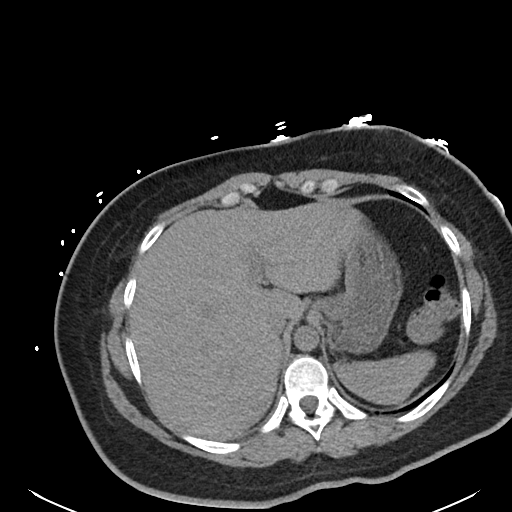
[im 99/105  soft-tissue]
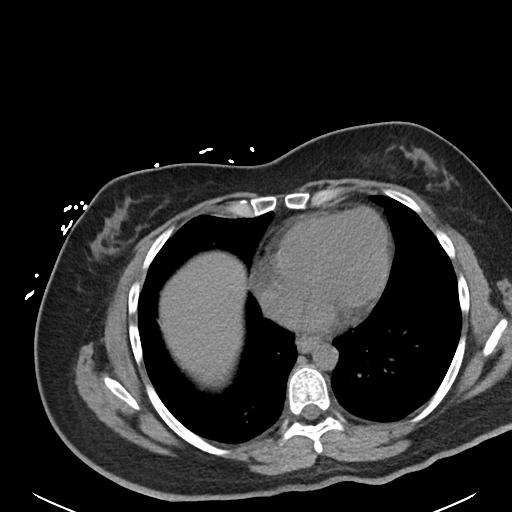

[Series 6: cor · coronal · 0.86mm/px · 3 of 91 slices shown]
[im 31/91  soft-tissue]
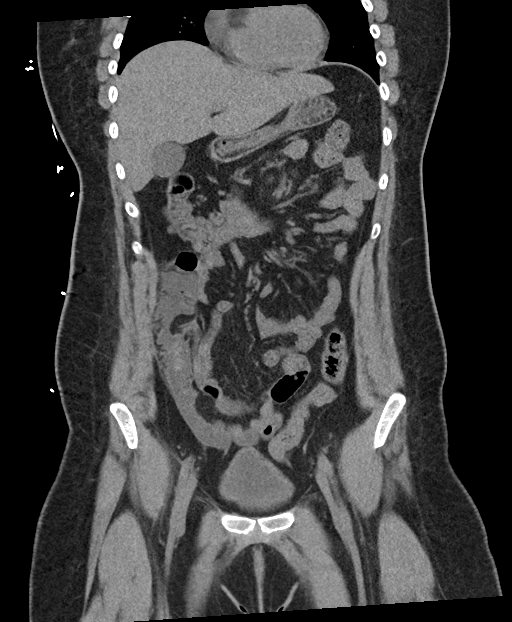
[im 41/91  soft-tissue]
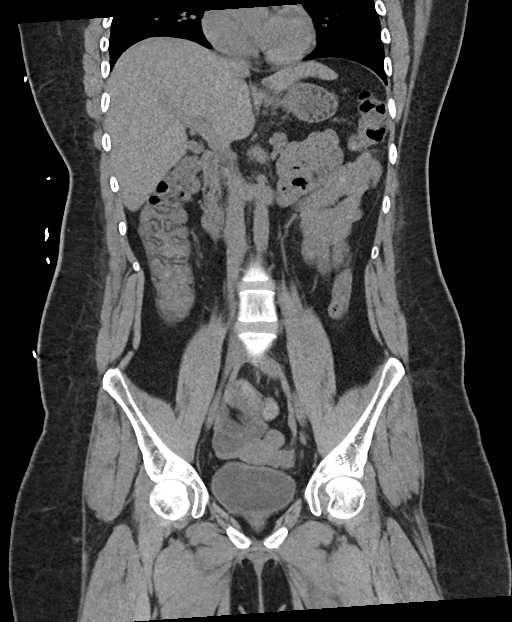
[im 51/91  soft-tissue]
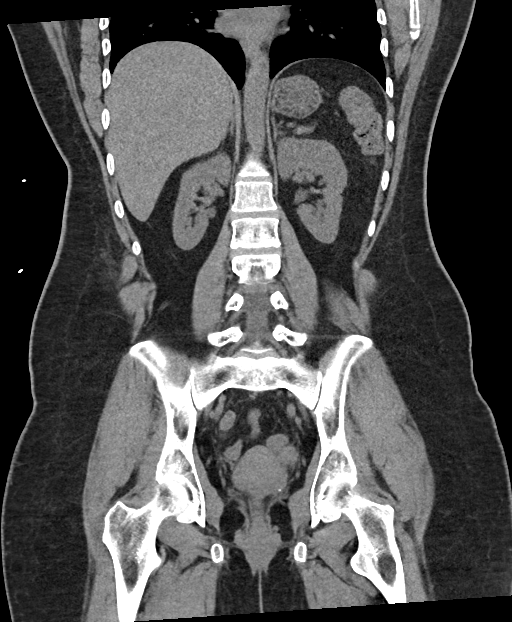

[16 of 46 positions shown; findings below may reference images not displayed]

FINDINGS: Lower chest: No acute abnormality.

Hepatobiliary: No focal liver abnormality is seen. Ill-defined
gallstones are seen within the gallbladder lumen without evidence of
gallbladder wall thickening or biliary dilatation.

Pancreas: Unremarkable. No pancreatic ductal dilatation or
surrounding inflammatory changes.

Spleen: Normal in size without focal abnormality.

Adrenals/Urinary Tract: Adrenal glands are unremarkable. Kidneys are
normal, without renal calculi, focal lesion, or hydronephrosis.
Bladder is unremarkable.

Stomach/Bowel: Stomach is within normal limits. Appendix appears
normal. No evidence of bowel wall thickening, distention, or
inflammatory changes.

Vascular/Lymphatic: No significant vascular findings are present. No
enlarged abdominal or pelvic lymph nodes.

Reproductive: The uterus is normal in size and appearance. A 3.4 cm
x 2.6 cm isodense area of soft tissue attenuation is seen within the
left adnexa (axial CT images 76 through 80, CT series number 3).
This represents a new finding when compared to the prior study.

Other: No abdominal wall hernia or abnormality. No abdominopelvic
ascites.

Musculoskeletal: No acute or significant osseous findings.
IMPRESSION: 1. Cholelithiasis.
2. Area of soft tissue attenuation within the left adnexa which may
be ovarian in origin. Correlation with pelvic ultrasound is
recommended.

## 2020-06-03 MED ORDER — ACETAMINOPHEN 500 MG PO TABS
1000.0000 mg | ORAL_TABLET | Freq: Once | ORAL | Status: AC
  Administered 2020-06-03: 1000 mg via ORAL
  Filled 2020-06-03: qty 2

## 2020-06-03 MED ORDER — ONDANSETRON 4 MG PO TBDP
8.0000 mg | ORAL_TABLET | Freq: Once | ORAL | Status: AC
  Administered 2020-06-03: 8 mg via ORAL
  Filled 2020-06-03: qty 2

## 2020-06-03 MED ORDER — ALUM & MAG HYDROXIDE-SIMETH 200-200-20 MG/5ML PO SUSP
30.0000 mL | Freq: Once | ORAL | Status: AC
  Administered 2020-06-03: 30 mL via ORAL
  Filled 2020-06-03: qty 30

## 2020-06-03 MED ORDER — SODIUM CHLORIDE 0.9 % IV BOLUS
1000.0000 mL | Freq: Once | INTRAVENOUS | Status: AC
  Administered 2020-06-03: 1000 mL via INTRAVENOUS

## 2020-06-03 MED ORDER — METOCLOPRAMIDE HCL 5 MG/ML IJ SOLN
10.0000 mg | Freq: Once | INTRAMUSCULAR | Status: AC
  Administered 2020-06-03: 10 mg via INTRAVENOUS
  Filled 2020-06-03: qty 2

## 2020-06-03 MED ORDER — DICYCLOMINE HCL 10 MG/ML IM SOLN
20.0000 mg | Freq: Once | INTRAMUSCULAR | Status: AC
  Administered 2020-06-03: 20 mg via INTRAMUSCULAR
  Filled 2020-06-03: qty 2

## 2020-06-03 MED ORDER — LIDOCAINE VISCOUS HCL 2 % MT SOLN
15.0000 mL | Freq: Once | OROMUCOSAL | Status: AC
  Administered 2020-06-03: 15 mL via ORAL
  Filled 2020-06-03: qty 15

## 2020-06-03 MED ORDER — ONDANSETRON HCL 4 MG PO TABS: 4.0000 mg | ORAL_TABLET | Freq: Three times a day (TID) | ORAL | 0 refills | Status: DC | PRN

## 2020-06-03 NOTE — ED Provider Notes (Signed)
Physical Exam  BP 122/82   Pulse (!) 106   Temp (!) 100.4 F (38 C) (Rectal)   Resp 20   SpO2 98%   Physical Exam  Patient overall well-appearing, lying in bed.  Heart with normal rate and rhythm, lungs clear to auscultation, abdomen with mild right upper quadrant tenderness no guarding or rebound tenderness.  No tenderness to lower abdomen.  ED Course/Procedures   Clinical Course as of 06/03/20 1814  Wed Jun 03, 2020  1116 I-stat hCG, quantitative: <5.0 [CG]  1116 Pulse Rate(!): 120 [CG]  1116 Pulse Rate(!): 106 [CG]  1330 Re-evaluated patient. She ambulated to bathroom with assistance. States her balance was off, more than usual. Denies light headedness, palpitations, CP or SOB. Abdominal pain completely resolved. Reports headache, tylenol given. HR still 110s, temp rechecked and normal. Will give 2nd L IVF. Pending UA  [CG]  1438 Hgb urine dipstick(!): LARGE [CG]  1438 RBC / HPF: 21-50 [CG]  1438 WBC, UA: 0-5 [CG]  1438 Squamous Epithelial / LPF: 11-20 [CG]  1438 Mucus: PRESENT [CG]  1438 Re-evaluated patient. She has completed 2nd L IVF. Reports lingering but improving epigastric abdominal pain, minimally tender. Negative murphy's.  Neuro exam reveals bilateral nystagmus (horizontal) and bilateral finger to nose ataxia - mother states this is not new.  Felt dizzy with standing and HR went up to 120s.  Able to take a few steps with spotter.  Mother states her walking seems the same as usual.  Given persistent tachycardia, dizziness, lingering pain and UA, will proceed with CTAP. Mother in agreement.  [CG]  1449 Ketones, ur(!): 20 [CG]  1451 Leukocytes,Ua: NEGATIVE [CG]  1451 RBC / HPF: 21-50 [CG]  1451 WBC, UA: 0-5 [CG]  1451 Bacteria, UA: NONE SEEN [CG]  1451 Squamous Epithelial / LPF: 11-20 Discussed UA, patient is not currently having menstrual bleeding. No history of kidney stones.  [CG]  1740 RN to check rectal temp  [CG]    Clinical Course User Index [CG] Kinnie Feil, PA-C    Procedures  MDM  BP 108/77   Pulse 94   Temp (!) 100.4 F (38 C) (Rectal)   Resp 16   SpO2 97%    Received signout from previous provider, please see note for complete H&P.  This is a 35 year old female history of gastroparesis presenting with complaints abdominal pain associate nausea vomiting and diarrhea which started this morning.  So far work-up was remarkable for mildly elevated temperature of 100.4, elevated white count of 14, tachycardia mild tachypnea.  She meets SIRS criteria.  Abdominal pelvis CAT scan has been ordered and have not resulted.  Patient have received 2 L of normal saline fluid.  Will reassess once CT resulted.  4:14 PM Abdominal pelvis CT scan demonstrate cholelithiasis as well as an area of soft tissue attenuation within the left adnexa which may be ovarian origin.  Correlation with pelvic ultrasound is recommended.  Patient does not have any significant discomfort to left lower quadrant left adnexal region and I do not think this is contributing to her current complaint.  I did mention that she would need a ultrasound outpatient which patient and mother agrees.  She does have some tenderness to her right upper quadrant and did report noticed a yellow emesis.  Will obtain limited abdominal ultrasound to rule out biliary disease causing her current symptoms.  5:39 PM Limited abdominal ultrasound show no acute finding.  No evidence of cholelithiasis.  Furthermore, respiratory panel came back  negative, negative for COVID as well as flu.  C. difficile panel came back negative as well.  Will perform p.o. trial if patient tolerates will discharge home with antinausea medication.  Encourage patient to have outpatient ultrasound of her reproductive system for further assessments of the incidental soft tissue mass that was noted on the CT scan.   Domenic Moras, PA-C 06/03/20 1815    Luna Fuse, MD 06/05/20 [phone]

## 2020-06-03 NOTE — ED Provider Notes (Signed)
Emergency Medicine Provider Triage Evaluation Note  Tracey Perkins , a 35 y.o. female  was evaluated in triage.  Pt complains of nausea and vomiting, abdominal pain, diarrhea. History from mom due to learning disability (brain tumor as a child), history of diabetes, has removed insulin pump. History of gastroparesis, has never been this sick with this, sees [facility]. No prior abdominal surgeries.  Review of Systems  Positive: Abdominal pain, vomiting, diarrhea  Negative: fever  Physical Exam  BP 109/76 (BP Location: Right Arm)   Pulse (!) 120   Temp (!) 97.5 F (36.4 C)   Resp 17   SpO2 98%  Gen:   Awake, no distress   Resp:  Normal effort  MSK:   Moves extremities without difficulty  Other:    Medical Decision Making  Medically screening exam initiated at 9:58 AM.  Appropriate orders placed.  Tracey Perkins was informed that the remainder of the evaluation will be completed by another provider, this initial triage assessment does not replace that evaluation, and the importance of remaining in the ED until their evaluation is complete.     [name], PA-C 06/03/20 1001    Quintella Reichert, MD 06/04/20 [phone]

## 2020-06-03 NOTE — Discharge Instructions (Signed)
You have symptoms likely due to viral stomach bug.  Drink plenty of fluid at home.  Take Zofran as needed for nausea.  Incidentally there is a soft tissue mass in your left ovary.  Discussed this with your primary care doctor and request for an ultrasound for further evaluation.  Return if you have any concern.

## 2020-06-03 NOTE — ED Provider Notes (Signed)
[facility] EMERGENCY DEPARTMENT Provider Note   CSN: [id number] Arrival date & time: 06/03/20  0940     History No chief complaint on file.   Tracey Perkins is a 35 y.o. 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  This began suddenly this morning.  Patient had liquidy brown diarrhea more than 5 times this morning.  Patient states she threw up every 3 minutes.  When she tried to stand up from the toilet she got very lightheaded and generally weak and had a hard time walking more than usual.  Nausea and vomiting was preceded by "stinky burps" which mother/patient state usually happens when she gets a gastroparesis flare.  Patient states she felt like she was going to pass out.  Mother states patient has gait and balance problems from brain tumor, patient has been more unsteady than usual. Mother removed insulin pump because her glucose levels were going down because she has not been able to eat.  Her last CBG on insulin pump was 275.  There is no chest pain, shortness of breath, palpitations or syncope.  States patient has never been this sick with gastroparesis before. They state patient had similar symptoms last Tuesday that began suddenly through the night.  She was brought into the ED for evaluation.  She was given IV medicines and fluids and felt better.  Symptoms essentially resolved by the time they were discharged from the emergency department.  Over the last week patient has had decreased appetite and upper abdominal discomfort with eating but has not had any more vomiting or diarrhea.  She has been eating a gentle diet to help with the abdominal discomfort.  Last night she had eggs.  No fevers.  No hematemesis, bright red blood or melena in her stool.  No coughing, chest  pain or shortness of breath.  No dysuria or hematuria.  She is vaccinated with 2 COVID vaccines but no booster.  No concern for COVID.  No previous abdominal surgeries.  Mother states they were visiting family in [place] in March.  Patient mounted a fever and they went to the emergency department.  States she was in the ED for 2 days for fever.  They gave her a lot of of IV antibiotics because they did not know where the fever was coming from.  They did not know what was causing the fever until the last few hours of ED stay patient developed diarrhea.  There were told that symptoms were likely from a stomach virus.  Her nephew eventually developed diarrhea as well.  No history of C. difficile.  No recent intake of suspicious food.  No international travel. No alcohol or recreational drug use including marijuana. She is followed by Dr Paulita [facility]. She has had a gastric emptying study that showed digestion was "slow". No endoscopies in the past.   HPI     Past Medical History:  Diagnosis Date  . Asthma   . Brain tumor ([place])   . Diabetes mellitus without complication ([place])   . Eczema   . Food allergy   . Gastroparesis   . Hypokalemia   . Hypomagnesemia   . Hypothyroid   . Neuropathy   . Post concussion syndrome 05/2019  . Thyroid disease   . Vision changes     Patient Active Problem  List   Diagnosis Date Noted  . Impulsiveness 10/14/2019  . Mild episode of recurrent major depressive disorder ([place]) 09/04/2019  . Anxiety 08/16/2019  . 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  . 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 Status post radiation therapy 11/22/2018  . Seasonal and perennial allergic rhinitis 11/22/2018  . Seasonal allergic conjunctivitis 11/22/2018  . Acute sinusitis 11/22/2018  . Keratosis pilaris 10/05/2017  . Melanocytic nevi of trunk 10/05/2017  . Adverse food reaction 02/09/2017  . Gastroesophageal reflux disease 02/09/2017  . Mild intermittent asthma with acute  exacerbation 11/19/2015  . Mild persistent asthma without complication [id number]  . Acute seasonal allergic rhinitis due to pollen 11/16/2015  . Anaphylactic shock due to adverse food reaction 11/16/2015  . Medulloblastoma ([place]) 11/16/2015  . Type 1 diabetes mellitus without complication ([place]) [id number]  . Dry mouth 11/16/2015  . Xerosis cutis 11/16/2015  . Nystagmus 08/15/2014  . Visual field defect 08/15/2014  . Disequilibrium 07/25/2014  . 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 BRAIN SURGERY    . Portacath placement and removed    . WISDOM TOOTH EXTRACTION       OB History   No obstetric history on file.     Family History  Problem Relation Age of Onset  . Thyroid disease Mother   . Eczema Sister   . Thyroid disease Sister   . Thyroid disease Sister     Social History   Tobacco Use  . Smoking status: Never Smoker  . Smokeless tobacco: Never Used  Vaping Use  . Vaping Use: Never used  Substance Use Topics  . Alcohol use: No  . Drug use: No    Home Medications Prior to Admission medications   Medication Sig Start Date End Date Taking? Authorizing Provider  acetaminophen (TYLENOL) 500 MG tablet Take 500 mg by mouth every 6 (six) hours as needed for moderate pain.     [provider]  aspirin EC 81 MG tablet Take 81 mg by mouth daily. Swallow whole.    [provider]  atorvastatin (LIPITOR) 40 MG tablet Take 40 mg by mouth at bedtime. 11/08/19   [provider]  azelastine (ASTELIN) 0.1 % nasal spray Place 2 sprays into both nostrils 2 (two) times daily. 12/25/19   Dara Hoyer, FNP  cetirizine (ZYRTEC) 10 MG tablet Take 1 tablet (10 mg total) by mouth daily. 12/25/19   Dara Hoyer, FNP  citalopram (CELEXA) 10 MG tablet Take 0.5 tablets (5 mg total) by mouth daily. Patient will cut pill in half. She has a traumatic brain injury and will take half the dose. 04/20/20    Salley Slaughter, NP  Continuous Blood Gluc Sensor (FREESTYLE LIBRE SENSOR SYSTEM) MISC USE 1 DEVICE EVERY 10 (TEN) DAYS. 01/29/17   [provider]  Continuous Blood Gluc Transmit (DEXCOM G6 TRANSMITTER) MISC USE 1 EACH EVERY 3 (THREE) MONTHS 06/24/19   [provider]  cyanocobalamin 1000 MCG tablet Take by mouth daily. 12/24/19 12/23/20  [provider]  EPINEPHrine 0.3 mg/0.3 mL IJ SOAJ injection Use as directed for severe allergic reaction. Patient taking differently: Inject 0.3 mg into the muscle as needed for anaphylaxis. Use as directed for severe allergic reaction. 02/19/18   Dara Hoyer, FNP  fluticasone (FLONASE) 50 MCG/ACT nasal spray 2 sprays per nostril daily as needed for stuffy nose. 12/25/19   Dara Hoyer, FNP  fluticasone (FLOVENT HFA) 110  MCG/ACT inhaler Inhale 2 puffs into the lungs daily. Rinse, gargle and spit out after use. 12/25/19   Dara Hoyer, FNP  gabapentin (NEURONTIN) 300 MG capsule Take 300 mg by mouth at bedtime.  01/25/17   [provider]  glucagon (GLUCAGON EMERGENCY) 1 MG injection Inject 1 mg into the skin once as needed (for diabeties).  Patient not taking: Reported on 12/25/2019 06/30/16   [provider]  Glucagon 3 MG/DOSE POWD as needed.    [provider]  glucose blood test strip Use 3 (three) times daily Use as instructed. 05/11/17   [provider]  insulin aspart (NOVOLOG) 100 UNIT/ML injection USE UP TO 130 UNITS DAILY, AS DIRECTED IN INSULIN PUMP. 12/24/19   [provider]  insulin glargine (LANTUS) 100 UNIT/ML injection Inject 16 Units into the skin daily.  Patient not taking: Reported on 12/25/2019 11/19/15   [provider]  Insulin Human (INSULIN PUMP) SOLN Inject 80 each into the skin daily. Novolog Insulin Pump    [provider]  KLOR-CON M10 10 MEQ tablet Take 10 mEq by mouth daily. 03/18/19   [provider]  levothyroxine (SYNTHROID, LEVOTHROID) 88 MCG  tablet Take 100 mcg by mouth daily before breakfast.     [provider]  lubiprostone (AMITIZA) 8 MCG capsule Take 16 mcg by mouth daily with breakfast.     [provider]  magnesium oxide (MAG-OX) 400 MG tablet Take 800 mg by mouth at bedtime.    [provider]  meclizine (ANTIVERT) 25 MG tablet Take 25 mg by mouth 2 (two) times daily as needed for dizziness.  Patient not taking: Reported on 12/25/2019 06/19/19   [provider]  metoCLOPramide (REGLAN) 10 MG tablet Take 1 tablet (10 mg total) by mouth every 6 (six) hours. 05/27/20   McDonald, Mia A, PA-C  montelukast (SINGULAIR) 10 MG tablet Take 1 tablet (10 mg total) by mouth at bedtime. To prevent coughing or wheezing 12/25/19   Dara Hoyer, FNP  norgestimate-ethinyl estradiol (ORTHO-CYCLEN) 0.25-35 MG-MCG tablet Take 1 tablet by mouth daily.     [provider]  PROAIR HFA 108 (90 Base) MCG/ACT inhaler TAKE 2 PUFFS BY MOUTH EVERY 4 HOURS AS NEEDED Patient taking differently: Inhale 2 puffs into the lungs every 6 (six) hours as needed for wheezing or shortness of breath.  04/01/19   Dara Hoyer, FNP    Allergies    Banana, Bactrim [sulfamethoxazole-trimethoprim], Doxycycline, Gentian violet, Gentian violet-proflavine sulfate [triple dye], Mold extract [trichophyton], Morphine and related, Other, and Vancomycin  Review of Systems   Review of Systems  Gastrointestinal: Positive for abdominal pain, diarrhea, nausea and vomiting.  Neurological: Positive for weakness (generalized) and light-headedness.  All other systems reviewed and are negative.   Physical Exam Updated Vital Signs BP 121/89   Pulse (!) 110   Temp 98.7 F (37.1 C) (Oral)   Resp (!) 22   SpO2 98%   Physical Exam Vitals and nursing note reviewed.  Constitutional:      Appearance: She is well-developed.     Comments: Non toxic in NAD  HENT:     Head: Normocephalic and atraumatic.     Nose: Nose normal.     Mouth/Throat:      Mouth: Mucous membranes are dry.  Eyes:     Conjunctiva/sclera: Conjunctivae normal.     Comments: Bilateral horizontal nystagmus - mother states this is not new.   Cardiovascular:     Rate and  Rhythm: Normal rate and regular rhythm.  Pulmonary:     Effort: Pulmonary effort is normal.     Breath sounds: Normal breath sounds.  Abdominal:     General: Bowel sounds are normal.     Palpations: Abdomen is soft.     Tenderness: There is abdominal tenderness (epigastric).     Comments: No G/R/R. No suprapubic or CVA tenderness. Negative Murphy's and McBurney's. Active BS to lower quadrants.   Musculoskeletal:        General: Normal range of motion.     Cervical back: Normal range of motion.  Skin:    General: Skin is warm and dry.     Capillary Refill: Capillary refill takes less than 2 seconds.  Neurological:     Mental Status: She is alert.     Comments:   Mental Status: Patient is awake, alert, oriented to person, place, year, and situation.  Patient is able to give a clear and coherent history. Speech is fluent and clear without dysarthria or aphasia. No signs of neglect.  Cranial Nerves: I not tested II temporal visual fields full bilaterally. PERRL.  Bilateral horizontal nystagmus.  III, IV, VI EOMs intact without ptosis V sensation to light touch intact in all 3 divisions of trigeminal nerve bilaterally  VII facial movements symmetric bilaterally VIII hearing intact to voice/conversation  IX, X no uvula deviation, symmetric rise of soft palate/uvula XI 5/5 SCM and trapezius strength bilaterally  XII tongue protrusion midline, symmetric L/R movements  Motor: Strength 5/5 in upper/lower extremities.  Sensation to light touch intact in face, upper/lower extremities. No pronator drift. No leg drop.  Cerebellar: Ataxia with finger to nose bilaterally. Slow but steady gait, wide stance.   Psychiatric:        Behavior: Behavior normal.     ED Results / Procedures /  Treatments   Labs (all labs ordered are listed, but only abnormal results are displayed) Labs Reviewed  CBC WITH DIFFERENTIAL/PLATELET - Abnormal; Notable for the following components:      Result Value   WBC 14.0 (*)    Platelets 407 (*)    Neutro Abs 10.3 (*)    Monocytes Absolute 1.2 (*)    All other components within normal limits  COMPREHENSIVE METABOLIC PANEL - Abnormal; Notable for the following components:   Glucose, Bld 193 (*)    Creatinine, Ser 1.16 (*)    All other components within normal limits  URINALYSIS, ROUTINE W REFLEX MICROSCOPIC - Abnormal; Notable for the following components:   APPearance HAZY (*)    Glucose, UA 150 (*)    Hgb urine dipstick LARGE (*)    Ketones, ur 20 (*)    All other components within normal limits  I-STAT CHEM 8, ED - Abnormal; Notable for the following components:   Sodium 134 (*)    Glucose, Bld 198 (*)    Calcium, Ion 1.05 (*)    Hemoglobin 16.0 (*)    HCT 47.0 (*)    All other components within normal limits  CBG MONITORING, ED - Abnormal; Notable for the following components:   Glucose-Capillary 263 (*)    All other components within normal limits  GASTROINTESTINAL PANEL BY PCR, STOOL (REPLACES STOOL CULTURE)  C DIFFICILE QUICK SCREEN W PCR REFLEX  LIPASE, BLOOD  I-STAT BETA HCG BLOOD, ED ([facility], [facility], [facility] ONLY)    EKG EKG Interpretation  Date/Time:  Wednesday Jun 03 2020 12:05:26 EDT Ventricular Rate:  105 PR Interval:  154 QRS Duration: 84  QT Interval:  349 QTC Calculation: 462 R Axis:   51 Text Interpretation: Sinus tachycardia Low voltage, precordial leads Abnormal ECG Confirmed by Carmin Muskrat [phone]) on 06/03/2020 12:08:09 PM   Radiology No results found.  Procedures Procedures   Medications Ordered in ED Medications  sodium chloride 0.9 % bolus 1,000 mL (has no administration in time range)  ondansetron (ZOFRAN-ODT) disintegrating tablet 8 mg (8 mg Oral Given 06/03/20 1013)  sodium chloride 0.9 % bolus  1,000 mL (0 mLs Intravenous Stopped 06/03/20 1249)  dicyclomine (BENTYL) injection 20 mg (20 mg Intramuscular Given 06/03/20 1150)  metoCLOPramide (REGLAN) injection 10 mg (10 mg Intravenous Given 06/03/20 1148)  alum & mag hydroxide-simeth (MAALOX/MYLANTA) 200-200-20 MG/5ML suspension 30 mL (30 mLs Oral Given 06/03/20 1307)    And  lidocaine (XYLOCAINE) 2 % viscous mouth solution 15 mL (15 mLs Oral Given 06/03/20 1307)  acetaminophen (TYLENOL) tablet 1,000 mg (1,000 mg Oral Given 06/03/20 1307)  sodium chloride 0.9 % bolus 1,000 mL (1,000 mLs Intravenous New Bag/Given 06/03/20 1400)    ED Course  I have reviewed the triage vital signs and the nursing notes.  Pertinent labs & imaging results that were available during my care of the patient were reviewed by me and considered in my medical decision making (see chart for details).  Clinical Course as of 06/03/20 1504  Wed Jun 03, 2020  1116 I-stat hCG, quantitative: <5.0 [CG]  1116 Pulse Rate(!): 120 [CG]  1116 Pulse Rate(!): 106 [CG]  1330 Re-evaluated patient. She ambulated to bathroom with assistance. States her balance was off, more than usual. Denies light headedness, palpitations, CP or SOB. Abdominal pain completely resolved. Reports headache, tylenol given. HR still 110s, temp rechecked and normal. Will give 2nd L IVF. Pending UA  [CG]  1438 Hgb urine dipstick(!): LARGE [CG]  1438 RBC / HPF: 21-50 [CG]  1438 WBC, UA: 0-5 [CG]  1438 Squamous Epithelial / LPF: 11-20 [CG]  1438 Mucus: PRESENT [CG]  1438 Re-evaluated patient. She has completed 2nd L IVF. Reports lingering but improving epigastric abdominal pain, minimally tender. Negative murphy's.  Neuro exam reveals bilateral nystagmus (horizontal) and bilateral finger to nose ataxia - mother states this is not new.  Felt dizzy with standing and HR went up to 120s.  Able to take a few steps with spotter.  Mother states her walking seems the same as usual.  Given persistent tachycardia,  dizziness, lingering pain and UA, will proceed with CTAP. Mother in agreement.  [CG]  1449 Ketones, ur(!): 20 [CG]  1451 Leukocytes,Ua: NEGATIVE [CG]  1451 RBC / HPF: 21-50 [CG]  1451 WBC, UA: 0-5 [CG]  1451 Bacteria, UA: NONE SEEN [CG]  1451 Squamous Epithelial / LPF: 11-20 Discussed UA, patient is not currently having menstrual bleeding. No history of kidney stones.  [CG]  1771 RN to check rectal temp  [CG]    Clinical Course User Index [CG] Kinnie Feil, PA-C   MDM Rules/Calculators/A&P                           35 y.o. yo female presents to the ED for recurrent nausea, vomiting, epigastric abdominal pain, diarrhea onset this morning. Mother reports history of gastroparesis.  Felt lightheaded when standing today. Mother concerned about dehydration.   Additional information obtained from chart, nursing and triage notes review  Seen in ER last week for nausea, vomiting, diarrhea. Symptoms resolved and had been improving until this morning. Tachycardic in triage  HR 120.  Chart review reveals - ER visit from last week reviewed, work up essentially normal.  BHA was 1.06. UGI barium study done 2021 that showed slightly delayed transit time. Had gastric emptying 2015 that showed minimal gastric empty consistent with gastroparesis.    Ordered lab, imaging were personally reviewed and interpreted  Labs reveal - WBC 14. Glucose 193, normal AG. Creatinine 1.16. Lipase, LFT normal. Hcg negative. UA is a poor sample - 11-20 squamous, 21-50 RBC, no WBC, leukocytes or bacteria.  Unclear as source of RBCs since patient is not having menses.  Pending stool studies.   Medicines ordered - zofran, GI cocktail, bentyl, reglan, 2 L IVF.  ED course  1345: Patient re-evaluated, reports complete resolution of abdominal pain. No vomiting or diarrhea in ER. Abdomen now non tender.  Awaiting UA. Given persistent tachcyardia will give second L IVF.  1502: Patient re-evaluated.  Positional dizziness  again, HR goes up into 120s with standing after 2 L IVF. Return of mild epigastric abdominal pain.  Will order CTAP and 3rd IVF.  RN to check rectal temp.   Patient will be handed off to oncoming EDPA who will follow up on CTAP, reassess.    MDM Recurrent/cycylical nausea, vomiting, abdominal pain and diarrhea in setting of known gastroparesis. Now with tachycardia, elevated WBC.  Will need further work up.   Portions of this note were generated with [employer]. Dictation errors may occur despite best attempts at proofreading   Final Clinical Impression(s) / ED Diagnoses Final diagnoses:  Nausea vomiting and diarrhea    Rx / DC Orders ED Discharge Orders    None       Kinnie Feil, PA-C 06/03/20 1504    Carmin Muskrat, MD 06/06/20 1244

## 2020-06-03 NOTE — ED Triage Notes (Signed)
Pt here from home with c/o n/v via ems , pt has hx of same the patient has some cong native  delay , mom is with pt

## 2020-06-04 LAB — GASTROINTESTINAL PANEL BY PCR, STOOL (REPLACES STOOL CULTURE)

## 2020-06-11 ENCOUNTER — Ambulatory Visit: Payer: Medicaid Other | Admitting: Physical Therapy

## 2020-06-11 ENCOUNTER — Other Ambulatory Visit: Payer: Self-pay

## 2020-06-11 DIAGNOSIS — R2689 Other abnormalities of gait and mobility: Secondary | ICD-10-CM

## 2020-06-11 DIAGNOSIS — R296 Repeated falls: Secondary | ICD-10-CM

## 2020-06-11 DIAGNOSIS — M6281 Muscle weakness (generalized): Secondary | ICD-10-CM

## 2020-06-11 DIAGNOSIS — R2681 Unsteadiness on feet: Secondary | ICD-10-CM

## 2020-06-11 NOTE — Therapy (Signed)
[facility] [address] [place], [name], [address] Phone: [phone]   Fax:  [phone]  Physical Therapy Treatment  Patient Details  Name: Tracey Perkins MRN: [id number] Date of Birth: [birth year] Referring Provider (PT): Erin Sons, MD   Encounter Date: 06/11/2020   PT End of Session - 06/11/20 0940    Visit Number 37    Number of Visits 41    Date for PT Re-Evaluation 07/21/20    Authorization Type Approved 12 visits from 03/24/20 > 07/21/20    Authorization - Visit Number 8    Authorization - Number of Visits 12    PT Start Time 0935    PT Stop Time 1015    PT Time Calculation (min) 40 min    Activity Tolerance Patient tolerated treatment well    Behavior During Therapy [facility] for tasks assessed/performed           Past Medical History:  Diagnosis Date  . Asthma   . Brain tumor ([place])   . Diabetes mellitus without complication ([place])   . Eczema   . Food allergy   . Gastroparesis   . Hypokalemia   . Hypomagnesemia   . Hypothyroid   . Neuropathy   . Post concussion syndrome 05/2019  . Thyroid disease   . Vision changes     Past Surgical History:  Procedure Laterality Date  . BRAIN SURGERY    . Portacath placement and removed    . WISDOM TOOTH EXTRACTION      There were no vitals filed for this visit.   Subjective Assessment - 06/11/20 0945    Subjective Had to go to ED last week with gastroparesis, was very weak.  Feeling better, is taking probiotics.  Has been walking with rollator, sometimes her foot kicks the back wheel.  Still having a hard time with inclines outside.    Patient is accompained by: Family member   Mother, Tracey Perkins   Pertinent History asthma, eczema, food allergy, polyneuropathy, uncontrolled type 1 diabetes, gastroparesis, h/o chronic L thalamic infarct, new R thalamic infarct since 2016, lymphocytic thyroiditis, s/p radiation therapy, chemo and 2 surgical resection for  medulloblastoma, childhood, nystagmus, visual field defect, sensorineural hearing loss of bilat ears    Patient Stated Goals Improving balance and decreasing falls.    Currently in Pain? No/denies                             [facility] Adult PT Treatment/Exercise - 06/11/20 0945      Ambulation/Gait   Ambulation/Gait Yes    Ambulation/Gait Assistance 5: Supervision    Ambulation/Gait Assistance Details intermittent cues to attend to and avoid obstacles and people in walking path    Ambulation Distance (Feet) 115 Feet    Assistive device Rollator    Gait Pattern Step-through pattern;Decreased stride length;Trunk flexed;Decreased trunk rotation;Decreased hip/knee flexion - right;Decreased hip/knee flexion - left;Poor foot clearance - left    Ambulation Surface Level;Indoor    Ramp 4: Min assist    Ramp Details (indicate cue type and reason) indoor ramp: performed balance training on ramp facing up ramp and then facing down ramp.  Facing up ramp with feet apart performed 10 reps head turns and head nods with EO, no UE support but therapist providing min A for balance.  Pt very nervous about falling when performing head turns.  Staggered stance facing up ramp performed 10 reps pushing off back LE  to weight shift forwards to front foot and maintaining x 5-6 reps with min A.  Changed to feet facing down ramp, feet apart, EO and holding without UE support but no head turns x 10 seconds x 3 reps.      Exercises   Exercises Knee/Hip      Knee/Hip Exercises: Aerobic   Tread Mill 1.3 mph, 0% incline x 5 minutes with focus on increasing stance time each LE, step length, heel strike and increased hip extension during terminal stance.  Performed for aerobic conditioning and to "warm up" LE for balance training                  PT Education - 06/11/20 1039    Education Details provided pt with aquatic HEP    Person(s) Educated Patient;Parent(s)    Methods Explanation;Handout     Comprehension Verbalized understanding            PT Short Term Goals - 03/16/20 1223      PT SHORT TERM GOAL #1   Title = LTG             PT Long Term Goals - 06/01/20 1454      PT LONG TERM GOAL #1   Title Patient will demonstrate ability to perform final land and aquatic HEP independently at home and at gym for ongoing wellness.    Baseline is performing HEP and treadmill; has joined a gym but not going consistently    Time 8    Period Weeks    Status Revised    Target Date 07/21/20      PT LONG TERM GOAL #2   Title Pt will improve DGI without AD to >/= 22/24 points to indicate decreased falls risk    Baseline 19/24    Time 8    Period Weeks    Status Revised    Target Date 07/21/20      PT LONG TERM GOAL #3   Title Pt will demonstrate ability to perform MCTSIB condition 3 for 15 seconds and condition 4 for up to 10 seconds to indicate improved sensory integration    Baseline Condition 3: 5 seconds; Condition 4: unable    Time 8    Period Weeks    Status Revised    Target Date 07/21/20      PT LONG TERM GOAL #4   Title Pt will report no dizziness with vertical head movements, bending down to the floor, supine <> sit and rolling in bed in order to decrease falls risk with ADL and mobility    Baseline dizziness with vertical head movements, sit > supine and rolling to R but pt reporting aural fullness and issues with allergies    Time 8    Period Weeks    Status Revised    Target Date 07/21/20                 Plan - 06/11/20 1040    Clinical Impression Statement Continued gait training and endurance training with walking on treadmill at faster speed with pt demonstrating improved step length, stance time and foot clearance.  Pt continues to have difficulty with adjusting balance and postural control when on inclines; rest of session focused on proprioception and postural control training on inclines with decreased UE support and with head movement.  Pt  reporting dizziness and significant anxiety about falling with head movements.  Will continue to address and progress towards LTG.    Comorbidities asthma,  eczema, food allergy, polyneuropathy, uncontrolled type 1 diabetes, gastroparesis, h/o chronic L thalamic infarct, new R thalamic infarct since 2016, lymphocytic thyroiditis, s/p radiation therapy, chemo and 2 surgical resection for medulloblastoma, childhood, nystagmus, visual field defect, sensorineural hearing loss of bilat ears    Rehab Potential Good    PT Frequency Other (comment)   2x/week x 4 (land and aquatic); + 1x/week x 4 (land only)   PT Duration Other (comment)    PT Treatment/Interventions ADLs/Self Care Home Management;Aquatic Therapy;Canalith Repostioning;DME Instruction;Gait training;Stair training;Functional mobility training;Patient/family education;Therapeutic activities;Orthotic Fit/Training;Therapeutic exercise;Balance training;Neuromuscular re-education;Vestibular    PT Next Visit Plan Treadmill to warm up if needed.  Balance on ramp facing up and down, RW on ramp.  Descending stairs.  Land: gait with rollator with higher level challenges/outside, habituation to vertical head movements.  Balance on compliant surfaces and with vision removed.    PT [place] - 03-24-20; [email or web address] aquatic    Consulted and Agree with Plan of Care Patient;Family member/caregiver    Family Member Consulted Mother           Patient will benefit from skilled therapeutic intervention in order to improve the following deficits and impairments:  Abnormal gait,Decreased coordination,Difficulty walking,Dizziness,Decreased activity tolerance,Decreased balance,Decreased strength,Impaired sensation,Pain  Visit Diagnosis: Other abnormalities of gait and mobility  Muscle weakness (generalized)  Unsteadiness on feet  Repeated falls     Problem List Patient Active Problem List   Diagnosis Date Noted  . Impulsiveness  10/14/2019  . Mild episode of recurrent major depressive disorder ([place]) 09/04/2019  . Anxiety 08/16/2019  . 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  . 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 Status post radiation therapy 11/22/2018  . Seasonal and perennial allergic rhinitis 11/22/2018  . Seasonal allergic conjunctivitis 11/22/2018  . Acute sinusitis 11/22/2018  . Keratosis pilaris 10/05/2017  . Melanocytic nevi of trunk 10/05/2017  . Adverse food reaction 02/09/2017  . Gastroesophageal reflux disease 02/09/2017  . Mild intermittent asthma with acute exacerbation 11/19/2015  . Mild persistent asthma without complication 92/44/6286  . Acute seasonal allergic rhinitis due to pollen 11/16/2015  . Anaphylactic shock due to adverse food reaction 11/16/2015  . 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 Type 1 diabetes mellitus without complication ([place]) 38/17/7116  . Dry mouth 11/16/2015  . Xerosis cutis 11/16/2015  . Nystagmus 08/15/2014  . Visual field defect 08/15/2014  . Disequilibrium 07/25/2014  . 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   Rico Junker, PT, DPT 06/11/20    10:47 AM    [facility] [address] [place], [name], [address] Phone: [phone]   Fax:  [phone]  Name: ZAMYIA GOWELL MRN: [id number] Date of Birth: 04-21-1985

## 2020-06-11 NOTE — Patient Instructions (Signed)
Access Code: [email or web address] URL: [email or web address] Date: 06/11/2020 Prepared by: Misty Stanley  Exercises Forward Walking - 1 x daily - 2 x weekly - 4 sets Backward Walking - 1 x daily - 2 x weekly - 4 sets Forward March - 1 x daily - 2 x weekly - 4 sets Backward March - 1 x daily - 2 x weekly - 4 sets Forward March with Opposite Arm Knee Taps and Hand Floats - 1 x daily - 2 x weekly - 4 sets Backward March with Opposite Arm Knee Taps and Hand Floats - 1 x daily - 2 x weekly - 4 sets Bilateral Shoulder Horizontal Abduction Adduction AROM - 1 x daily - 2 x weekly - 4 sets Standing March at [facility] - 1 x daily - 2 x weekly - 15 reps Standing Hip Flexion Extension at [employer] - 1 x daily - 2 x weekly - 15 reps Standing Hip Abduction Adduction at [employer] - 1 x daily - 2 x weekly - 15 reps Squat - 1 x daily - 2 x weekly - 15 reps Single Leg Squat - 1 x daily - 2 x weekly - 15 reps

## 2020-06-16 ENCOUNTER — Ambulatory Visit: Payer: Medicaid Other | Admitting: Physical Therapy

## 2020-06-16 ENCOUNTER — Other Ambulatory Visit: Payer: Self-pay

## 2020-06-16 DIAGNOSIS — R2689 Other abnormalities of gait and mobility: Secondary | ICD-10-CM | POA: Diagnosis not present

## 2020-06-16 DIAGNOSIS — M6281 Muscle weakness (generalized): Secondary | ICD-10-CM

## 2020-06-16 DIAGNOSIS — R2681 Unsteadiness on feet: Secondary | ICD-10-CM

## 2020-06-16 NOTE — Therapy (Signed)
[facility] [address] [place], [name], [address] Phone: [phone]   Fax:  [phone]  Physical Therapy Treatment  Patient Details  Name: Tracey Perkins MRN: [id number] Date of Birth: 05-11-1985 Referring Provider (PT): Erin Sons, MD   Encounter Date: 06/16/2020   PT End of Session - 06/16/20 1854    Visit Number 38    Number of Visits 41    Date for PT Re-Evaluation 07/21/20    Authorization Type Approved 12 visits from 03/24/20 > 07/21/20    Authorization - Visit Number 9    Authorization - Number of Visits 12    PT Start Time 0934    PT Stop Time 1015    PT Time Calculation (min) 41 min    Equipment Utilized During Treatment Gait belt    Activity Tolerance Patient tolerated treatment well    Behavior During Therapy [facility] for tasks assessed/performed           Past Medical History:  Diagnosis Date  . Asthma   . Brain tumor ([place])   . Diabetes mellitus without complication ([place])   . Eczema   . Food allergy   . Gastroparesis   . Hypokalemia   . Hypomagnesemia   . Hypothyroid   . Neuropathy   . Post concussion syndrome 05/2019  . Thyroid disease   . Vision changes     Past Surgical History:  Procedure Laterality Date  . BRAIN SURGERY    . Portacath placement and removed    . WISDOM TOOTH EXTRACTION      There were no vitals filed for this visit.   Subjective Assessment - 06/16/20 0938    Subjective Pt states she got very upset in last PT session as she became very dizzy with doing the head turns; is getting ready to go to [place] next week with her mother to visit her grandparents    Patient is accompained by: Family member   Mother, [name]   Pertinent History asthma, eczema, food allergy, polyneuropathy, uncontrolled type 1 diabetes, gastroparesis, h/o chronic L thalamic infarct, new R thalamic infarct since 2016, lymphocytic thyroiditis, s/p radiation therapy, chemo and 2 surgical  resection for medulloblastoma, childhood, nystagmus, visual field defect, sensorineural hearing loss of bilat ears    Patient Stated Goals Improving balance and decreasing falls.    Currently in Pain? No/denies                             [facility] Adult PT Treatment/Exercise - 06/16/20 0001      Transfers   Transfers Sit to Stand;Stand to Sit    Sit to Stand 5: Supervision    Stand to Sit 5: Supervision    Comments Feet on blue Airex for increased challenge with balance upon initial standing;  No UE support used; pt kept eyes closed during transfer, stating it helped her to focus better      Ambulation/Gait   Ambulation/Gait Yes    Ambulation/Gait Assistance Details pt performed intermittent horizontal head turns during amb. with rollator    Ambulation Distance (Feet) 230 Feet    Assistive device Rollator    Gait Pattern Step-through pattern;Decreased stride length;Trunk flexed;Decreased trunk rotation;Decreased hip/knee flexion - right;Decreased hip/knee flexion - left;Poor foot clearance - left    Ambulation Surface Level;Indoor      Neuro Re-ed    Neuro Re-ed Details  Pt performed standing on incline with CGA to min  assist prn for recovery of LOB (no rollator used with this activity): pt performed marching with EO focusing straight ahead; marching with EO with horizontal head tuns 5 reps and then vertical head turns EO;  standing on incline - 10 secs with EC; pt then performed marching on decline - EO with no head turns with CGA; alternate stepping down/back 5 reps each foot with 1 head turn to 1 side only;  standing on decline with EC 10 sec hold      Knee/Hip Exercises: Stretches   Active Hamstring Stretch Left;1 rep;30 seconds   runner's stretch (due to c/o tightness in Lt hamstring with stepping activity on incline)   Gastroc Stretch Left;1 rep;30 seconds   used 2" block on floor of // bars     Knee/Hip Exercises: Standing   Forward Step Up Both;1 set;10 reps;Hand  Hold: 1;Step Height: 6"               Balance Exercises - 06/16/20 0001      Balance Exercises: Standing   Stepping Strategy Anterior;Posterior;5 reps   on incline and then on decline   Rockerboard Anterior/posterior;EO;5 reps   with UE support on // bars - horizontal and vertical EO 5 reps each   Gait with Head Turns Forward;1 rep    Other Standing Exercises stepping over and back of balance beam - standing on floor outside // bars 5 reps each leg with 1 UE support on bar               PT Short Term Goals - 03/16/20 1223      PT SHORT TERM GOAL #1   Title = LTG             PT Long Term Goals - 06/16/20 1859      PT LONG TERM GOAL #1   Title Patient will demonstrate ability to perform final land and aquatic HEP independently at home and at gym for ongoing wellness.    Baseline is performing HEP and treadmill; has joined a gym but not going consistently    Time 8    Period Weeks    Status Revised      PT LONG TERM GOAL #2   Title Pt will improve DGI without AD to >/= 22/24 points to indicate decreased falls risk    Baseline 19/24    Time 8    Period Weeks    Status Revised      PT LONG TERM GOAL #3   Title Pt will demonstrate ability to perform MCTSIB condition 3 for 15 seconds and condition 4 for up to 10 seconds to indicate improved sensory integration    Baseline Condition 3: 5 seconds; Condition 4: unable    Time 8    Period Weeks    Status Revised      PT LONG TERM GOAL #4   Title Pt will report no dizziness with vertical head movements, bending down to the floor, supine <> sit and rolling in bed in order to decrease falls risk with ADL and mobility    Baseline dizziness with vertical head movements, sit > supine and rolling to R but pt reporting aural fullness and issues with allergies    Time 8    Period Weeks    Status Revised                 Plan - 06/16/20 1855    Clinical Impression Statement Pt c/o increased dizziness and onset of  nystagmus with activity of walking and holding/focusing on ball for improved gaze stabilization so activity was discontinued.  Pt had LOB with standing on incline and on decline with head turns with EO, requiring min assist for balance recovery.  Pt continues to report anxiety with amb. with head turns - did not attempt amb. with head turns without rollator in order to reduce anxiety and fear of falling.  Cont with POC.    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, childhood, nystagmus, visual field defect, sensorineural hearing loss of bilat ears    Rehab Potential Good    PT Frequency Other (comment)   2x/week x 4 (land and aquatic); + 1x/week x 4 (land only)   PT Duration Other (comment)    PT Treatment/Interventions ADLs/Self Care Home Management;Aquatic Therapy;Canalith Repostioning;DME Instruction;Gait training;Stair training;Functional mobility training;Patient/family education;Therapeutic activities;Orthotic Fit/Training;Therapeutic exercise;Balance training;Neuromuscular re-education;Vestibular    PT Next Visit Plan Treadmill to warm up if needed.  Balance on ramp facing up and down, RW on ramp.  Descending stairs.  Land: gait with rollator with higher level challenges/outside, habituation to vertical head movements.  Balance on compliant surfaces and with vision removed.    PT [place] - 03-24-20; [email or web address] aquatic    Consulted and Agree with Plan of Care Patient;Family member/caregiver    Family Member Consulted Mother           Patient will benefit from skilled therapeutic intervention in order to improve the following deficits and impairments:  Abnormal gait,Decreased coordination,Difficulty walking,Dizziness,Decreased activity  tolerance,Decreased balance,Decreased strength,Impaired sensation,Pain  Visit Diagnosis: Other abnormalities of gait and mobility  Muscle weakness (generalized)  Unsteadiness on feet     Problem List Patient Active Problem List   Diagnosis Date Noted  . Impulsiveness 10/14/2019  . Mild episode of recurrent major depressive disorder ([place]) 09/04/2019  . Anxiety 08/16/2019  . 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  . 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 Status post radiation therapy 11/22/2018  . Seasonal and perennial allergic rhinitis 11/22/2018  . Seasonal allergic conjunctivitis 11/22/2018  . Acute sinusitis 11/22/2018  . Keratosis pilaris 10/05/2017  . Melanocytic nevi of trunk 10/05/2017  . Adverse food reaction 02/09/2017  . Gastroesophageal reflux disease 02/09/2017  . Mild intermittent asthma with acute exacerbation 11/19/2015  . Mild persistent asthma without complication 58/09/9831  . Acute seasonal allergic rhinitis due to pollen 11/16/2015  . Anaphylactic shock due to adverse food reaction 11/16/2015  . 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 Type 1 diabetes mellitus without complication ([place]) 82/50/5397  . Dry mouth 11/16/2015  . Xerosis cutis 11/16/2015  . Nystagmus 08/15/2014  . Visual field defect 08/15/2014  . Disequilibrium 07/25/2014  . 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    Ebin Palazzi, [place], PT 06/16/2020, 7:02 PM  [facility] [address] [place], [name], [address] Phone: [phone]   Fax:  [phone]  Name: NATOSHA BOU MRN: [id number] Date of Birth: 1985/04/11

## 2020-06-29 ENCOUNTER — Other Ambulatory Visit: Payer: Self-pay | Admitting: Family Medicine

## 2020-06-30 ENCOUNTER — Other Ambulatory Visit: Payer: Self-pay | Admitting: Internal Medicine

## 2020-06-30 ENCOUNTER — Ambulatory Visit: Payer: Medicaid Other | Attending: Neurology | Admitting: Physical Therapy

## 2020-06-30 ENCOUNTER — Other Ambulatory Visit: Payer: Self-pay

## 2020-06-30 DIAGNOSIS — R2689 Other abnormalities of gait and mobility: Secondary | ICD-10-CM | POA: Diagnosis not present

## 2020-06-30 DIAGNOSIS — R42 Dizziness and giddiness: Secondary | ICD-10-CM

## 2020-06-30 DIAGNOSIS — N9489 Other specified conditions associated with female genital organs and menstrual cycle: Secondary | ICD-10-CM

## 2020-06-30 DIAGNOSIS — R2681 Unsteadiness on feet: Secondary | ICD-10-CM | POA: Diagnosis present

## 2020-06-30 DIAGNOSIS — R296 Repeated falls: Secondary | ICD-10-CM | POA: Diagnosis present

## 2020-06-30 DIAGNOSIS — M6281 Muscle weakness (generalized): Secondary | ICD-10-CM

## 2020-07-01 NOTE — Therapy (Signed)
[facility] [address] [place], [name], [address] Phone: [phone]   Fax:  [phone]  Physical Therapy Treatment  Patient Details  Name: Tracey Perkins MRN: [id number] Date of Birth: 1985/03/20 Referring Provider (PT): Erin Sons, MD   Encounter Date: 06/30/2020   PT End of Session - 06/30/20 1632     Visit Number 39    Number of Visits 41    Date for PT Re-Evaluation 07/21/20    Authorization Type Approved 12 visits from 03/24/20 > 07/21/20    Authorization - Visit Number 10    Authorization - Number of Visits 12    PT Start Time 5732    PT Stop Time 2025    PT Time Calculation (min) 40 min    Activity Tolerance Patient tolerated treatment well    Behavior During Therapy [facility] for tasks assessed/performed             Past Medical History:  Diagnosis Date   Asthma    Brain tumor ([place])    Diabetes mellitus without complication ([place])    Eczema    Food allergy    Gastroparesis    Hypokalemia    Hypomagnesemia    Hypothyroid    Neuropathy    Post concussion syndrome 05/2019   Thyroid disease    Vision changes     Past Surgical History:  Procedure Laterality Date   BRAIN SURGERY     Portacath placement and removed     WISDOM TOOTH EXTRACTION      There were no vitals filed for this visit.   Subjective Assessment - 06/30/20 1546     Subjective Butters went well; was in the pool a lot.  Dad has pt walking up and down inclined driveway.    Patient is accompained by: Family member   Mother, [name]   Pertinent History asthma, eczema, food allergy, polyneuropathy, uncontrolled type 1 diabetes, gastroparesis, h/o chronic L thalamic infarct, new R thalamic infarct since 2016, lymphocytic thyroiditis, s/p radiation therapy, chemo and 2 surgical resection for medulloblastoma, childhood, nystagmus, visual field defect, sensorineural hearing loss of bilat ears    Patient Stated Goals Improving  balance and decreasing falls.                               [facility] Adult PT Treatment/Exercise - 07/01/20 1435       Knee/Hip Exercises: Aerobic   Tread Mill x 3 minutes facing forwards with incline at 6% x 1.0 mph with bilat UE support to simulate continuous walking uphill to facilitate increased hip extension and foot clearance.  Also performed simulated continuous walking downhill facing backwards on treadmill, incline at 6%, 0.8 mph with bilat UE support.  Pt reported greater difficulty with decline due to feeling as if she is going to fall forwards.                 Balance Exercises - 07/01/20 1438       Balance Exercises: Standing   SLS with Vectors Foam/compliant surface;Upper extremity assist 1;Other reps (comment);Limitations    SLS with Vectors Limitations Standing on blue foam performing alternating SLS while contralateral LE performed taps to cones: single taps to ipsilateral side, middle and across midline - 3 cones x 5 sets each side.  Progressed to double tap on each cone x 3-4 sets each side.  Pt demonstrated greatest difficulty balancing on LLE and tapping across  midline with RLE.  Changed to 2 cones on R and L and performed alternating taps to R and L to incorporate more closed chain hip IR/ER; performed x 5-6 reps each LE.    Other Standing Exercises Standing on blue foam with 3 cones in front of patient: performed bending down to pick up one cone and then reaching across midline to place on counter and then returning each cone back down to floor; performed with R and LUE to facilitate rotation to each side.  No anterior LOB and no dizziness reported with repeated reaching or rotating                 PT Short Term Goals - 03/16/20 1223       PT SHORT TERM GOAL #1   Title = LTG               PT Long Term Goals - 06/16/20 1859       PT LONG TERM GOAL #1   Title Patient will demonstrate ability to perform final land and aquatic  HEP independently at home and at gym for ongoing wellness.    Baseline is performing HEP and treadmill; has joined a gym but not going consistently    Time 8    Period Weeks    Status Revised      PT LONG TERM GOAL #2   Title Pt will improve DGI without AD to >/= 22/24 points to indicate decreased falls risk    Baseline 19/24    Time 8    Period Weeks    Status Revised      PT LONG TERM GOAL #3   Title Pt will demonstrate ability to perform MCTSIB condition 3 for 15 seconds and condition 4 for up to 10 seconds to indicate improved sensory integration    Baseline Condition 3: 5 seconds; Condition 4: unable    Time 8    Period Weeks    Status Revised      PT LONG TERM GOAL #4   Title Pt will report no dizziness with vertical head movements, bending down to the floor, supine <> sit and rolling in bed in order to decrease falls risk with ADL and mobility    Baseline dizziness with vertical head movements, sit > supine and rolling to R but pt reporting aural fullness and issues with allergies    Time 8    Period Weeks    Status Revised                   Plan - 06/30/20 1633     Clinical Impression Statement Continued to focus on gait training on inclined surfaces with use of treadmill to simulate continuous ambulation uphill and downhill with pt demonstrated improved step length, foot clearance and terminal hip extension.  Pt continues to have increased difficulty with dowhill due to feeling as if she is going to fall forwards.  Also continued to focus on SLS and dynamic balance training on compliants surfaces adding in reaching to the ground and rotation.  No anterior LOB or dizziness reported during balance activities but continues to experience some anxiety and fear of falling.    Comorbidities asthma, eczema, food allergy, polyneuropathy, uncontrolled type 1 diabetes, gastroparesis, h/o chronic L thalamic infarct, new R thalamic infarct since 2016, lymphocytic thyroiditis, s/p  radiation therapy, chemo and 2 surgical resection for medulloblastoma, childhood, nystagmus, visual field defect, sensorineural hearing loss of bilat ears    Rehab Potential  Good    PT Frequency Other (comment)   2x/week x 4 (land and aquatic); + 1x/week x 4 (land only)   PT Duration Other (comment)    PT Treatment/Interventions ADLs/Self Care Home Management;Aquatic Therapy;Canalith Repostioning;DME Instruction;Gait training;Stair training;Functional mobility training;Patient/family education;Therapeutic activities;Orthotic Fit/Training;Therapeutic exercise;Balance training;Neuromuscular re-education;Vestibular    PT Next Visit Plan Treadmill to warm up if needed.  Begin to check LTG; finalize HEP.  D/C on second visit.    PT [facility] - 03-24-20; [id number] aquatic    Consulted and Agree with Plan of Care Patient;Family member/caregiver    Family Member Consulted Mother             Patient will benefit from skilled therapeutic intervention in order to improve the following deficits and impairments:  Abnormal gait, Decreased coordination, Difficulty walking, Dizziness, Decreased activity tolerance, Decreased balance, Decreased strength, Impaired sensation, Pain  Visit Diagnosis: Other abnormalities of gait and mobility  Muscle weakness (generalized)  Unsteadiness on feet  Repeated falls  Dizziness and giddiness     Problem List Patient Active Problem List   Diagnosis Date Noted   Impulsiveness 10/14/2019   Mild episode of recurrent major depressive disorder ([place]) 09/04/2019   Anxiety 08/16/2019   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   Status post radiation therapy 11/22/2018   Seasonal and perennial allergic rhinitis 11/22/2018   Seasonal allergic conjunctivitis 11/22/2018   Acute sinusitis 11/22/2018   Keratosis pilaris 10/05/2017   Melanocytic nevi of trunk 10/05/2017   Adverse food reaction 02/09/2017    Gastroesophageal reflux disease 02/09/2017   Mild intermittent asthma with acute exacerbation 11/19/2015   Mild persistent asthma without complication 01/05/7587   Acute seasonal allergic rhinitis due to pollen 11/16/2015   Anaphylactic shock due to adverse food reaction 11/16/2015   Medulloblastoma ([facility]) 11/16/2015   Type 1 diabetes mellitus without complication ([place]) 32/54/9826   Dry mouth 11/16/2015   Xerosis cutis 11/16/2015   Nystagmus 08/15/2014   Visual field defect 08/15/2014   Disequilibrium 07/25/2014   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    Rico Junker, PT, DPT 07/01/20    2:43 PM   [facility] [address] [place], [name], [address] Phone: [phone]   Fax:  [phone]  Name: Tracey Perkins MRN: [id number] Date of Birth: 1985/10/20

## 2020-07-06 ENCOUNTER — Ambulatory Visit: Payer: Medicaid Other | Admitting: Physical Therapy

## 2020-07-06 ENCOUNTER — Other Ambulatory Visit: Payer: Self-pay

## 2020-07-06 ENCOUNTER — Encounter: Payer: Self-pay | Admitting: Physical Therapy

## 2020-07-06 DIAGNOSIS — R2689 Other abnormalities of gait and mobility: Secondary | ICD-10-CM

## 2020-07-06 DIAGNOSIS — R2681 Unsteadiness on feet: Secondary | ICD-10-CM

## 2020-07-06 DIAGNOSIS — R42 Dizziness and giddiness: Secondary | ICD-10-CM

## 2020-07-06 DIAGNOSIS — M6281 Muscle weakness (generalized): Secondary | ICD-10-CM

## 2020-07-06 DIAGNOSIS — R296 Repeated falls: Secondary | ICD-10-CM

## 2020-07-06 NOTE — Therapy (Signed)
[facility] [address] [place], [name], [address] Phone: [phone]   Fax:  [phone]  Physical Therapy Treatment  Patient Details  Name: Tracey Perkins MRN: [id number] Date of Birth: 1985/08/20 Referring Provider (PT): Erin Sons, MD   Encounter Date: 07/06/2020   PT End of Session - 07/06/20 1158     Visit Number 40    Number of Visits 41    Date for PT Re-Evaluation 07/21/20    Authorization Type Approved 12 visits from 03/24/20 > 07/21/20    Authorization - Visit Number 11    Authorization - Number of Visits 12    PT Start Time 1105    PT Stop Time 3662    PT Time Calculation (min) 40 min    Activity Tolerance Patient tolerated treatment well    Behavior During Therapy [facility] for tasks assessed/performed             Past Medical History:  Diagnosis Date   Asthma    Brain tumor ([place])    Diabetes mellitus without complication ([place])    Eczema    Food allergy    Gastroparesis    Hypokalemia    Hypomagnesemia    Hypothyroid    Neuropathy    Post concussion syndrome 05/2019   Thyroid disease    Vision changes     Past Surgical History:  Procedure Laterality Date   BRAIN SURGERY     Portacath placement and removed     WISDOM TOOTH EXTRACTION      There were no vitals filed for this visit.   Subjective Assessment - 07/06/20 1110     Subjective Pt got new glasses with prisms on Friday.  Is not wearing while walking right now.  Went on a date this past weekend.    Patient is accompained by: Family member   Mother, Tracey Perkins   Pertinent History asthma, eczema, food allergy, polyneuropathy, uncontrolled type 1 diabetes, gastroparesis, h/o chronic L thalamic infarct, new R thalamic infarct since 2016, lymphocytic thyroiditis, s/p radiation therapy, chemo and 2 surgical resection for medulloblastoma, childhood, nystagmus, visual field defect, sensorineural hearing loss of bilat ears    Patient  Stated Goals Improving balance and decreasing falls.    Currently in Pain? No/denies                [facility] PT Assessment - 07/06/20 1113       Dynamic Gait Index   Level Surface Normal    Change in Gait Speed Normal    Gait with Horizontal Head Turns Normal    Gait with Vertical Head Turns Normal    Gait and Pivot Turn Normal    Step Over Obstacle Normal    Step Around Obstacles Normal    Steps Mild Impairment    Total Score 23    DGI comment: 23/24 with old glasses                 Vestibular Assessment - 07/06/20 1121       Balancemaster   Balancemaster Comment MCTSIB: 30 seconds condition 1 and 2; 10-15 seconds seconds condition 3; 5 seconds condition 4.      Positional Sensitivities   Sit to Supine No dizziness    Supine to Left Side No dizziness    Supine to Right Side No dizziness    Supine to Sitting Lightheadedness    Nose to Right Knee No dizziness    Right Knee to Sitting No dizziness  Nose to Left Knee No dizziness    Left Knee to Sitting No dizziness    Head Turning x 5 No dizziness    Head Nodding x 5 No dizziness    Pivot Right in Standing No dizziness    Pivot Left in Standing Mild dizziness    Rolling Right No dizziness    Rolling Left No dizziness    Positional Sensitivities Comments Repeated with new glasses; only slight dizziness with body turns.                              PT Education - 07/06/20 1157     Education Details Progress towards goals, plan for final visit next week    Person(s) Educated Patient;Parent(s)    Methods Explanation    Comprehension Verbalized understanding              PT Short Term Goals - 03/16/20 1223       PT SHORT TERM GOAL #1   Title = LTG               PT Long Term Goals - 07/06/20 1158       PT LONG TERM GOAL #1   Title Patient will demonstrate ability to perform final land and aquatic HEP independently at home and at gym for ongoing wellness.    Baseline  is performing HEP and treadmill; has joined a gym but not going consistently    Time 8    Period Weeks    Status On-going      PT LONG TERM GOAL #2   Title Pt will improve DGI without AD to >/= 22/24 points to indicate decreased falls risk    Baseline 19/24 > 23/24    Time 8    Period Weeks    Status Achieved      PT LONG TERM GOAL #3   Title Pt will demonstrate ability to perform MCTSIB condition 3 for 15 seconds and condition 4 for up to 10 seconds to indicate improved sensory integration    Baseline Condition 3: 15 seconds; Condition 4: 5 seconds - improved but not to goal    Time 8    Period Weeks    Status Partially Met      PT LONG TERM GOAL #4   Title Pt will report no dizziness with vertical head movements, bending down to the floor, supine <> sit and rolling in bed in order to decrease falls risk with ADL and mobility    Baseline mild dizziness with body turns    Time 8    Period Weeks    Status Achieved                   Plan - 07/06/20 1203     Clinical Impression Statement Performed assessment of patient progress towards LTG.  Pt has met LTG #2 and #4 and demonstrates significant improvement in falls risk and balance during dynamic gait without AD.  Pt does continue to use rollator for longer distances in the community and uneven surfaces.  Pt also demonstrates significant decrease in overall motion sensitivity.  Pt partially met MCTSIB goal and demonstrates slight improvement in ability to maintain balance on compliant surfaces but continues to require external assistance to prevent fall.  Repeated motion sensitivity testing with new prism glasses with no significant change in symptoms.  Will assess DGI next visit with prisms and will finalize HEP.  Comorbidities asthma, eczema, food allergy, polyneuropathy, uncontrolled type 1 diabetes, gastroparesis, h/o chronic L thalamic infarct, new R thalamic infarct since 2016, lymphocytic thyroiditis, s/p radiation  therapy, chemo and 2 surgical resection for medulloblastoma, childhood, nystagmus, visual field defect, sensorineural hearing loss of bilat ears    Rehab Potential Good    PT Frequency Other (comment)   2x/week x 4 (land and aquatic); + 1x/week x 4 (land only)   PT Duration Other (comment)    PT Treatment/Interventions ADLs/Self Care Home Management;Aquatic Therapy;Canalith Repostioning;DME Instruction;Gait training;Stair training;Functional mobility training;Patient/family education;Therapeutic activities;Orthotic Fit/Training;Therapeutic exercise;Balance training;Neuromuscular re-education;Vestibular    PT Next Visit Plan Check DGI with prisms, update HEP and D/C    PT Home Exercise Plan [facility] - 03-24-20; [id number] aquatic    Consulted and Agree with Plan of Care Patient;Family member/caregiver    Family Member Consulted Mother             Patient will benefit from skilled therapeutic intervention in order to improve the following deficits and impairments:  Abnormal gait, Decreased coordination, Difficulty walking, Dizziness, Decreased activity tolerance, Decreased balance, Decreased strength, Impaired sensation, Pain  Visit Diagnosis: Other abnormalities of gait and mobility  Muscle weakness (generalized)  Repeated falls  Dizziness and giddiness  Unsteadiness on feet     Problem List Patient Active Problem List   Diagnosis Date Noted   Impulsiveness 10/14/2019   Mild episode of recurrent major depressive disorder ([place]) 09/04/2019   Anxiety 08/16/2019   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   Status post radiation therapy 11/22/2018   Seasonal and perennial allergic rhinitis 11/22/2018   Seasonal allergic conjunctivitis 11/22/2018   Acute sinusitis 11/22/2018   Keratosis pilaris 10/05/2017   Melanocytic nevi of trunk 10/05/2017   Adverse food reaction 02/09/2017   Gastroesophageal reflux disease 02/09/2017   Mild  intermittent asthma with acute exacerbation 11/19/2015   Mild persistent asthma without complication 02/63/7858   Acute seasonal allergic rhinitis due to pollen 11/16/2015   Anaphylactic shock due to adverse food reaction 11/16/2015   Medulloblastoma ([place]) 11/16/2015   Type 1 diabetes mellitus without complication ([place]) 85/02/7739   Dry mouth 11/16/2015   Xerosis cutis 11/16/2015   Nystagmus 08/15/2014   Visual field defect 08/15/2014   Disequilibrium 07/25/2014   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    Rico Junker, PT, DPT 07/06/20    12:10 PM    [facility] [address] [place], [name], [address] Phone: [phone]   Fax:  [phone]  Name: Tracey Perkins MRN: [id number] Date of Birth: Jun 14, 1985

## 2020-07-08 ENCOUNTER — Telehealth (INDEPENDENT_AMBULATORY_CARE_PROVIDER_SITE_OTHER): Payer: Medicaid Other | Admitting: Psychiatry

## 2020-07-08 ENCOUNTER — Encounter (HOSPITAL_COMMUNITY): Payer: Self-pay | Admitting: Psychiatry

## 2020-07-08 ENCOUNTER — Other Ambulatory Visit: Payer: Self-pay

## 2020-07-08 DIAGNOSIS — F419 Anxiety disorder, unspecified: Secondary | ICD-10-CM | POA: Diagnosis not present

## 2020-07-08 DIAGNOSIS — F330 Major depressive disorder, recurrent, mild: Secondary | ICD-10-CM | POA: Diagnosis not present

## 2020-07-08 MED ORDER — CITALOPRAM HYDROBROMIDE 10 MG PO TABS: 5.0000 mg | ORAL_TABLET | Freq: Every day | ORAL | 2 refills | Status: DC

## 2020-07-08 NOTE — Progress Notes (Signed)
[place] MD/PA/NP OP Progress Note Virtual Visit via Telephone Note  I connected with Tracey Perkins on 07/08/20 at  3:00 PM EDT by telephone and verified that I am speaking with the correct person using two identifiers.  Location: Patient: home Provider: Clinic   I discussed the limitations, risks, security and privacy concerns of performing an evaluation and management service by telephone and the availability of in person appointments. I also discussed with the patient that there may be a patient responsible charge related to this service. The patient expressed understanding and agreed to proceed.   I provided 30 minutes of non-face-to-face time during this encounter.      07/08/2020 3:34 PM Tracey Perkins  MRN:  [id number]  Chief Complaint:  "I have had quite a few anxiety attacks but im afraid to go up on my medication"     HPI: 35 year old female seen today for follow up psychiatric evaluation.   She has a psychiatric history of depression and anxiey.  She is currently being managed on Celexa 5 mg daily. She notes that her medication is somewhat effective in managing her psychiatric conditions.  Today she was unable to login virtually so her exam was done over the phone. During exam she was pleasant, cooperative, and engaged in conversation.  She informed Probation officer that since her last visit she has had quite a few anxiety attacks.  She notes that the last attack was during her physical therapy appointment.  She notes that she was told by her physical therapist that her anxiety maybe exacerbated when she becomes overwhelmed with task that she perceives is too hard.  She notes that she believes that her physical therapist is correct and does get overwhelmed when she tries to complete tasks that is now difficult. She also notes that her anxiety is increased when she has difficulty breathing. She notes that one of her medications at times affects her breathing. She notes that she and her mother are  keeping a journal to figure out when exactly attacks are occurring. She also notes that she visits her PCP regularly.  Today provider conducted a GAD-7 and patient scored an 8, at her last visit she scored a 7.  She informed Probation officer that when she is anxious she feels that she dissociates. Provider asked patient if this only occurs when anxious and she notes it did. Provider recommended patient following up with a therapist to learn positive coping mechanisms to deal with anxiety. She was greave to this recommendation. Provider also conducted a PHQ-9 and patient scored a, at her last visit she scored a 9, at her last visit she scored a 6.  Today she denies SI/HI/VH or paranoia.  She endorses adequate sleep and appetite.    Patient informed writer that recently she has been having pain in her feet. She notes that gabapentin has been somewhat effective in managing her pain.  Patient notes that her mother thinks she can be impulsive at times with spending and her money and other behaviors but she denies other symptoms of mania. Provider spoke to patient mother and was informed that patient recently got into an argument with her friends. She notes her friends sided with her ex and she dislike that. She notes that Tracey Perkins would lie to her family and say that she was going visit friends or go to the store and would catch a cab to see her ex boyfriend who now has a restraining order against her. She also notes that Tracey Perkins was  not invited to a birthday party which was bothersome to her. Patients mother informed Probation officer that she has signed her daughter up on an online group with other people who have traumatic brain injuries (TBI). She notes that they plan to visit a TBI camp sometime this summer. She also notes that her daughter is talking to a new female friend and is hopeful that the relationship will be beneficial for her daughter. She notes her daughter wants her independence however due to her TBI she needs more  assistance.   No medication changes made today.  Patient agreeable to continue Celexa as prescribed.  Patient will follow up with outpatient counseling for therapy.  No other concerns noted at this time.   Visit Diagnosis:    ICD-10-CM   1. Anxiety  F41.9 citalopram (CELEXA) 10 MG tablet    2. Mild episode of recurrent major depressive disorder (HCC)  F33.0 citalopram (CELEXA) 10 MG tablet      Past Psychiatric History: Anxiety and depression   Past Medical History:  Past Medical History:  Diagnosis Date   Asthma    Brain tumor ([place])    Diabetes mellitus without complication ([place])    Eczema    Food allergy    Gastroparesis    Hypokalemia    Hypomagnesemia    Hypothyroid    Neuropathy    Post concussion syndrome 05/2019   Thyroid disease    Vision changes     Past Surgical History:  Procedure Laterality Date   BRAIN SURGERY     Portacath placement and removed     WISDOM TOOTH EXTRACTION      Family Psychiatric History: Maternal uncle schizophrenia and overdosed on pain pills. Paternal cousin and aunt bipolar  Family History:  Family History  Problem Relation Age of Onset   Thyroid disease Mother    Eczema Sister    Thyroid disease Sister    Thyroid disease Sister     Social History:  Social History   Socioeconomic History   Marital status: Single    Spouse name: Not on file   Number of children: Not on file   Years of education: Not on file   Highest education level: Not on file  Occupational History   Not on file  Tobacco Use   Smoking status: Never   Smokeless tobacco: Never  Vaping Use   Vaping Use: Never used  Substance and Sexual Activity   Alcohol use: No   Drug use: No   Sexual activity: Yes    Birth control/protection: Pill  Other Topics Concern   Not on file  Social History Narrative   Not on file   Social Determinants of Health   Financial Resource Strain: Not on file  Food Insecurity: Not on file  Transportation Needs: Not on  file  Physical Activity: Not on file  Stress: Not on file  Social Connections: Not on file    Allergies:  Allergies  Allergen Reactions   Banana Other (See Comments)    Mouth burning and swollen   Bactrim [Sulfamethoxazole-Trimethoprim] Hives   Doxycycline Hives   Gentian Violet Other (See Comments)    "It burns."  Per mother   Gentian Violet-Proflavine Sulfate [Triple Dye] Other (See Comments)    Mouth burn   Mold Extract [Trichophyton] Other (See Comments)    Hay Fever   Morphine And Related Hives   Other Other (See Comments) and Hives    "Hay fever, dust and pollen."  Per patient.  Vancomycin Other (See Comments)    Red man syndrome     Metabolic Disorder Labs: No results found for: HGBA1C, MPG No results found for: PROLACTIN No results found for: CHOL, TRIG, HDL, CHOLHDL, VLDL, LDLCALC No results found for: TSH  Therapeutic Level Labs: No results found for: LITHIUM No results found for: VALPROATE No components found for:  CBMZ  Current Medications: Current Outpatient Medications  Medication Sig Dispense Refill   acetaminophen (TYLENOL) 500 MG tablet Take 500 mg by mouth every 6 (six) hours as needed for moderate pain.      aspirin EC 81 MG tablet Take 81 mg by mouth daily. Swallow whole.     atorvastatin (LIPITOR) 40 MG tablet Take 40 mg by mouth at bedtime.     azelastine (ASTELIN) 0.1 % nasal spray Place 2 sprays into both nostrils 2 (two) times daily. 30 mL 5   cetirizine (ZYRTEC) 10 MG tablet Take 1 tablet (10 mg total) by mouth daily. 34 tablet 5   citalopram (CELEXA) 10 MG tablet Take 0.5 tablets (5 mg total) by mouth daily. Patient will cut pill in half. She has a traumatic brain injury and will take half the dose. 30 tablet 2   Continuous Blood Gluc Sensor (FREESTYLE LIBRE SENSOR SYSTEM) MISC USE 1 DEVICE EVERY 10 (TEN) DAYS.  11   Continuous Blood Gluc Transmit (DEXCOM G6 TRANSMITTER) MISC USE 1 EACH EVERY 3 (THREE) MONTHS     cyanocobalamin 1000 MCG  tablet Take by mouth daily.     EPINEPHrine 0.3 mg/0.3 mL IJ SOAJ injection Use as directed for severe allergic reaction. (Patient taking differently: Inject 0.3 mg into the muscle as needed for anaphylaxis. Use as directed for severe allergic reaction.) 2 Device 1   fluticasone (FLONASE) 50 MCG/ACT nasal spray 2 sprays per nostril daily as needed for stuffy nose. 16 g 5   fluticasone (FLOVENT HFA) 110 MCG/ACT inhaler Inhale 2 puffs into the lungs daily. Rinse, gargle and spit out after use. 1 each 5   gabapentin (NEURONTIN) 300 MG capsule Take 300 mg by mouth at bedtime.   3   glucagon (GLUCAGON EMERGENCY) 1 MG injection Inject 1 mg into the skin once as needed (for diabeties).  (Patient not taking: Reported on 12/25/2019)     Glucagon 3 MG/DOSE POWD as needed.     glucose blood test strip Use 3 (three) times daily Use as instructed.     insulin aspart (NOVOLOG) 100 UNIT/ML injection USE UP TO 130 UNITS DAILY, AS DIRECTED IN INSULIN PUMP.     insulin glargine (LANTUS) 100 UNIT/ML injection Inject 16 Units into the skin daily.  (Patient not taking: Reported on 12/25/2019)     Insulin Human (INSULIN PUMP) SOLN Inject 80 each into the skin daily. Novolog Insulin Pump     KLOR-CON M10 10 MEQ tablet Take 10 mEq by mouth daily.     levothyroxine (SYNTHROID, LEVOTHROID) 88 MCG tablet Take 100 mcg by mouth daily before breakfast.      lubiprostone (AMITIZA) 8 MCG capsule Take 16 mcg by mouth daily with breakfast.      magnesium oxide (MAG-OX) 400 MG tablet Take 800 mg by mouth at bedtime.     meclizine (ANTIVERT) 25 MG tablet Take 25 mg by mouth 2 (two) times daily as needed for dizziness.  (Patient not taking: Reported on 12/25/2019)     metoCLOPramide (REGLAN) 10 MG tablet Take 1 tablet (10 mg total) by mouth every 6 (six) hours. 30 tablet 0  montelukast (SINGULAIR) 10 MG tablet TAKE 1 TABLET (10 MG TOTAL) BY MOUTH AT BEDTIME. TO PREVENT COUGHING OR WHEEZING 34 tablet 0   norgestimate-ethinyl estradiol  (ORTHO-CYCLEN) 0.25-35 MG-MCG tablet Take 1 tablet by mouth daily.      ondansetron (ZOFRAN) 4 MG tablet Take 1 tablet (4 mg total) by mouth every 8 (eight) hours as needed for nausea or vomiting. 12 tablet 0   PROAIR HFA 108 (90 Base) MCG/ACT inhaler TAKE 2 PUFFS BY MOUTH EVERY 4 HOURS AS NEEDED (Patient taking differently: Inhale 2 puffs into the lungs every 6 (six) hours as needed for wheezing or shortness of breath. ) 6.7 g 0   No current facility-administered medications for this visit.     Musculoskeletal: Strength & Muscle Tone:  Unable to assess due to telephone visit Gait & Station:  Unable to assess due to telephone visit Patient leans: N/A  Psychiatric Specialty Exam: Review of Systems  There were no vitals taken for this visit.There is no height or weight on file to calculate BMI.  General Appearance:  Unable to assess due to telephone visit  Eye Contact:   Unable to assess due to telephone visit  Speech:  Clear and Coherent and Normal Rate  Volume:  Normal  Mood:  Euthymic  Affect:  Appropriate and Congruent  Thought Process:  Coherent, Goal Directed and Linear  Orientation:  Full (Time, Place, and Person)  Thought Content: WDL and Logical   Suicidal Thoughts:  No  Homicidal Thoughts:  No  Memory:  Immediate;   Good Recent;   Good Remote;   Good  Judgement:  Good  Insight:  Good  Psychomotor Activity:  Normal  Concentration:  Concentration: Good and Attention Span: Good  Recall:  Good  Fund of Knowledge: Good  Language: Good  Akathisia:  No  Handed:  Right  AIMS (if indicated):Not done  Assets:  Communication Skills Desire for Improvement Financial Resources/Insurance Housing Social Support  ADL's:  Intact  Cognition: WNL  Sleep:  Good   Screenings: GAD-7    Flowsheet Row Video Visit from 07/08/2020 in [facility] Video Visit from 04/20/2020 in [facility]  Total GAD-7 Score 8 7      PHQ2-9     Flowsheet Row Video Visit from 07/08/2020 in [facility] Video Visit from 04/20/2020 in [facility] Counselor from 09/17/2019 in [facility] Nutrition [facility]/20/2014 in [facility]  PHQ-2 Total Score 1 0 2 0  PHQ-9 Total Score 9 6 7  --      Flowsheet Row ED from 06/03/2020 in [facility] ED from 05/27/2020 in [facility] No Risk No Risk        Assessment and Plan: Patient notes that she is doing well on her current medication regimen.  No medication changes made today.  Patient agreeable to continue medications as prescribed.  1. Anxiety  Continue- citalopram (CELEXA) 10 MG tablet; Take 0.5 tablets (5 mg total) by mouth daily. Patient will cut pill in half. She has a traumatic brain injury and will take half the dose.  Dispense: 30 tablet; Refill: 2  2. Mild episode of recurrent major depressive disorder (HCC)  Continue- citalopram (CELEXA) 10 MG tablet; Take 0.5 tablets (5 mg total) by mouth daily. Patient will cut pill in half. She has a traumatic brain injury and will  take half the dose.  Dispense: 30 tablet; Refill: 2     Follow-up in 3 months Follow-up with therapy  Salley Slaughter, NP 07/08/2020, 3:34 PM

## 2020-07-09 ENCOUNTER — Ambulatory Visit (INDEPENDENT_AMBULATORY_CARE_PROVIDER_SITE_OTHER): Payer: Medicaid Other | Admitting: Clinical

## 2020-07-09 DIAGNOSIS — F330 Major depressive disorder, recurrent, mild: Secondary | ICD-10-CM

## 2020-07-09 NOTE — Progress Notes (Signed)
   THERAPIST PROGRESS NOTE Virtual Visit via Telephone Note  I connected with [name] on 07/09/20 at 10:00 AM EDT by telephone and verified that I am speaking with the correct person using two identifiers.  Location: Patient: home Provider: office   I discussed the limitations, risks, security and privacy concerns of performing an evaluation and management service by telephone and the availability of in person appointments. I also discussed with the patient that there may be a patient responsible charge related to this service. The patient expressed understanding and agreed to proceed.   Follow Up Instructions:  I discussed the assessment and treatment plan with the patient. The patient was provided an opportunity to ask questions and all were answered. The patient agreed with the plan and demonstrated an understanding of the instructions.   The patient was advised to call back or seek an in-person evaluation if the symptoms worsen or if the condition fails to improve as anticipated.   Session Time: 30 minutes  Participation Level: Active  Behavioral Response: NAAlertEuthymic  Type of Therapy: Individual Therapy  Treatment Goals addressed: Coping  Interventions: CBT and Supportive  Summary:  Tracey Perkins is a 35 y.o. female who presents for the scheduled appointment oriented times five, appropriately dressed, and friendly. Client denied hallucinations and delusions.  Client reported on today that she has been doing fairly well. Client reported since the last appointment her anxiety has increased. Client reported she has been having anxiety attacks and she is dissociating. Client reported feeling like she is separate from her body. Client reported it takes awhile for her to come out of feeling that way. Client reported her mom pinches her but she said there still seems to be a disconnect. Client reported her brain injury makes it harder for her to get to a point of  comprehension that she is okay. Client reported her sister has anxiety and pinching works to help her ground herself but it doesn't seem to work for her. Client reported she also has had changes with her friends describing how five of them have "dropped" her. Client reported they were going out to do activities that she is not comfortable doing. Client reported she has been spending time with a new guy. Client reported tonight they are going on their fifth date. Client reported she is interested in researching other therapist offices that have more frequent availability.      Suicidal/Homicidal: Nowithout intent/plan  Therapist Response:  Therapist began the session checking in asking the client how she has been doing since last seen. Therapist active listened and used positive emotional support while she discussed her thoughts and feelings.  Therapist use CBT to the client open ended questions about her experiences with "dissociation" and what external factors trigger it. Therapist used CBT to discuss grounding techniques. Therapist assigned the client homework to find things that would appeal to her senses to help ease anxiety. Therapist gave the client information on how to research therapist in her network.     Plan: Return again in 5 weeks for individual therapy.  Diagnosis: Mild episode of recurrent major depressive disorder   Mauri Temkin Y Tonnie Stillman, LCSW 07/09/2020

## 2020-07-13 ENCOUNTER — Other Ambulatory Visit: Payer: Self-pay

## 2020-07-13 ENCOUNTER — Ambulatory Visit: Payer: Medicaid Other | Admitting: Physical Therapy

## 2020-07-13 DIAGNOSIS — M6281 Muscle weakness (generalized): Secondary | ICD-10-CM

## 2020-07-13 DIAGNOSIS — R2689 Other abnormalities of gait and mobility: Secondary | ICD-10-CM | POA: Diagnosis not present

## 2020-07-13 DIAGNOSIS — R2681 Unsteadiness on feet: Secondary | ICD-10-CM

## 2020-07-13 DIAGNOSIS — R42 Dizziness and giddiness: Secondary | ICD-10-CM

## 2020-07-13 DIAGNOSIS — R296 Repeated falls: Secondary | ICD-10-CM

## 2020-07-13 MED ORDER — EPINEPHRINE 0.3 MG/0.3ML IJ SOAJ: INTRAMUSCULAR | 1 refills | Status: DC

## 2020-07-13 NOTE — Therapy (Signed)
[facility] [address] [place], [name], [address] Phone: [phone]   Fax:  [phone]  Physical Therapy Treatment and D/C Summary  Patient Details  Name: Tracey Perkins MRN: [id number] Date of Birth: [birth year] Referring Provider (Tracey Perkins): Erin Sons, MD   Encounter Date: 07/13/2020   Tracey Perkins End of Session - 07/13/20 1237     Visit Number 41    Number of Visits 41    Date for Tracey Perkins Re-Evaluation 07/21/20    Authorization Type Approved 12 visits from 03/24/20 > 07/21/20    Authorization - Visit Number 12    Authorization - Number of Visits 12    Tracey Perkins Start Time 5188    Tracey Perkins Stop Time 1318    Tracey Perkins Time Calculation (min) 43 min    Activity Tolerance Patient tolerated treatment well    Behavior During Therapy [facility] for tasks assessed/performed             Past Medical History:  Diagnosis Date   Asthma    Brain tumor ([place])    Diabetes mellitus without complication ([place])    Eczema    Food allergy    Gastroparesis    Hypokalemia    Hypomagnesemia    Hypothyroid    Neuropathy    Post concussion syndrome 05/2019   Thyroid disease    Vision changes     Past Surgical History:  Procedure Laterality Date   BRAIN SURGERY     Portacath placement and removed     WISDOM TOOTH EXTRACTION      There were no vitals filed for this visit.   Subjective Assessment - 07/13/20 1658     Subjective Had a good weekend, family from [place] are coming in for the 4th.    Patient is accompained by: Family member   Mother, Tracey Perkins   Pertinent History asthma, eczema, food allergy, polyneuropathy, uncontrolled type 1 diabetes, gastroparesis, h/o chronic L thalamic infarct, new R thalamic infarct since 2016, lymphocytic thyroiditis, s/p radiation therapy, chemo and 2 surgical resection for medulloblastoma, childhood, nystagmus, visual field defect, sensorineural hearing loss of bilat ears    Patient Stated Goals Improving balance and  decreasing falls.    Currently in Pain? No/denies                [facility] Tracey Perkins Assessment - 07/13/20 1239       Dynamic Gait Index   Level Surface Normal    Change in Gait Speed Normal    Gait with Horizontal Head Turns Mild Impairment    Gait with Vertical Head Turns Normal    Gait and Pivot Turn Normal    Step Over Obstacle Normal    Step Around Obstacles Normal    Steps Mild Impairment    Total Score 22    DGI comment: Using Prism glasses; slight double vision with head turns causing veering            TREADMILL: Is safe to walk on the treadmill with supervision with both hands holding on.  1.2 mph start at 8 minutes.  0 elevation.   Every week increase time by 2 minutes.  Work up to 20-30 minutes and then start to increase speed gradually.    (This is in place of walking outside if weather is bad/hot)  Reviewed the following exercises with patient.  Tracey Perkins return demonstrated each exercise.  Discussed how to perform balance exercises with UE support or in corner for safety at home.  Access Code: [email or web address] URL: [email or web address] Date: 07/13/2020 Prepared by: Tracey Perkins  Exercises Supine Hamstring Stretch with Strap - 1 x daily - 7 x weekly - 2 sets - 30 second hold Seated Gastroc Stretch with Strap - 1 x daily - 7 x weekly - 2 sets - 30 seconds hold Stride Stance Weight Shift - 1 x daily - 7 x weekly - 2 sets - 10 reps Tree Pose - 1 x daily - 7 x weekly - 2 sets - 2 reps - 10 second hold Feet Apart on Pillow-Eyes Closed-Head turns - 1 x daily - 7 x weekly - 2 sets - 10 reps Stand on pillow or cushion, Eyes closed, feet together - 1 x daily - 7 x weekly - 2 sets - 10 seconds hold     Tracey Perkins Education - 07/13/20 1659     Education Details difference in DGI score with prism glasses; final HEP to continue to work on at D/C.  D/C today    Person(s) Educated Patient;Parent(s)    Methods Explanation;Demonstration    Comprehension Verbalized  understanding;Returned demonstration              Tracey Perkins Short Term Goals - 03/16/20 1223       Tracey Perkins SHORT TERM GOAL #1   Title = LTG               Tracey Perkins Long Term Goals - 07/13/20 1700       Tracey Perkins LONG TERM GOAL #1   Title Patient will demonstrate ability to perform final land and aquatic HEP independently at home and at gym for ongoing wellness.    Baseline is performing HEP and treadmill; has joined a gym but not going consistently    Time 8    Period Weeks    Status Achieved      Tracey Perkins LONG TERM GOAL #2   Title Tracey Perkins will improve DGI without AD to >/= 22/24 points to indicate decreased falls risk    Baseline 19/24 > 23/24    Time 8    Period Weeks    Status Achieved      Tracey Perkins LONG TERM GOAL #3   Title Tracey Perkins will demonstrate ability to perform MCTSIB condition 3 for 15 seconds and condition 4 for up to 10 seconds to indicate improved sensory integration    Baseline Condition 3: 15 seconds; Condition 4: 5 seconds - improved but not to goal    Time 8    Period Weeks    Status Partially Met      Tracey Perkins LONG TERM GOAL #4   Title Tracey Perkins will report no dizziness with vertical head movements, bending down to the floor, supine <> sit and rolling in bed in order to decrease falls risk with ADL and mobility    Baseline mild dizziness with body turns    Time 8    Period Weeks    Status Achieved                   Plan - 07/13/20 1701     Clinical Impression Statement Completed assessment of progress towards LTG with re-assessment of DGI today with prism glasses and review of final HEP.  Tracey Perkins has made excellent progress and has met 3/4 LTG.  Tracey Perkins partially met MCTSIB goal and continues to have difficulty with maintaining balance on compliant surfaces and with vision removed.  Exercises added to HEP to continue to address these impairments.  Tracey Perkins demonstrates low falls risk and has  completed gait training with rollator for longer distances in the community and is able to use safely.  Tracey Perkins is ready for  D/C from Tracey Perkins today.    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, childhood, nystagmus, visual field defect, sensorineural hearing loss of bilat ears    Rehab Potential Good    Tracey Perkins Frequency Other (comment)   2x/week x 4 (land and aquatic); + 1x/week x 4 (land only)   Tracey Perkins Duration Other (comment)    Tracey Perkins Treatment/Interventions ADLs/Self Care Home Management;Aquatic Therapy;Canalith Repostioning;DME Instruction;Gait training;Stair training;Functional mobility training;Patient/family education;Therapeutic activities;Orthotic Fit/Training;Therapeutic exercise;Balance training;Neuromuscular re-education;Vestibular    Tracey Perkins Next Visit Plan D/C    Tracey Perkins Home Exercise Plan [place]; [id number] aquatic    Consulted and Agree with Plan of Care Patient;Family member/caregiver    Family Member Consulted Mother             Patient will benefit from skilled therapeutic intervention in order to improve the following deficits and impairments:  Abnormal gait, Decreased coordination, Difficulty walking, Dizziness, Decreased activity tolerance, Decreased balance, Decreased strength, Impaired sensation, Pain  Visit Diagnosis: Other abnormalities of gait and mobility  Muscle weakness (generalized)  Repeated falls  Dizziness and giddiness  Unsteadiness on feet     Problem List Patient Active Problem List   Diagnosis Date Noted   Impulsiveness 10/14/2019   Mild episode of recurrent major depressive disorder ([place]) 09/04/2019   Anxiety 08/16/2019   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   Status post radiation therapy 11/22/2018   Seasonal and perennial allergic rhinitis 11/22/2018   Seasonal allergic conjunctivitis 11/22/2018    Acute sinusitis 11/22/2018   Keratosis pilaris 10/05/2017   Melanocytic nevi of trunk 10/05/2017   Adverse food reaction 02/09/2017   Gastroesophageal reflux disease 02/09/2017   Mild intermittent asthma with acute exacerbation 11/19/2015   Mild persistent asthma without complication 93/23/5573   Acute seasonal allergic rhinitis due to pollen 11/16/2015   Anaphylactic shock due to adverse food reaction 11/16/2015   Medulloblastoma ([place]) 11/16/2015   Type 1 diabetes mellitus without complication ([place]) 22/02/5425   Dry mouth 11/16/2015   Xerosis cutis 11/16/2015   Nystagmus 08/15/2014   Visual field defect 08/15/2014   Disequilibrium 07/25/2014   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   PHYSICAL THERAPY DISCHARGE SUMMARY  Visits from Start of Care: 41  Current functional level related to goals / functional outcomes: See LTG achievement and impression statement above.   Remaining deficits: Balance impairments on compliant surface or with vision removed   Education / Equipment: HEP, rollator   Patient agrees to discharge. Patient goals were met. Patient is being discharged due to meeting the stated rehab goals.  Tracey Perkins, Tracey Perkins, Tracey Perkins 07/13/20    5:06 PM    [facility] [address] [place], [name], [address] Phone: [phone]   Fax:  [phone]  Name: Tracey Perkins MRN: [id number] Date of Birth: 1985-12-01

## 2020-07-13 NOTE — Patient Instructions (Addendum)
TREADMILL: Is safe to walk on the treadmill with supervision with both hands holding on.  1.2 mph start at 8 minutes.  0 elevation.   Every week increase time by 2 minutes.  Work up to 20-30 minutes and then start to increase speed gradually.    (This is in place of walking outside if weather is bad/hot)    Access Code: [email or web address] URL: [email or web address] Date: 07/13/2020 Prepared by: Misty Stanley  Exercises Supine Hamstring Stretch with Strap - 1 x daily - 7 x weekly - 2 sets - 30 second hold Seated Gastroc Stretch with Strap - 1 x daily - 7 x weekly - 2 sets - 30 seconds hold Stride Stance Weight Shift - 1 x daily - 7 x weekly - 2 sets - 10 reps Tree Pose - 1 x daily - 7 x weekly - 2 sets - 2 reps - 10 second hold Feet Apart on Pillow-Eyes Closed-Head turns - 1 x daily - 7 x weekly - 2 sets - 10 reps Stand on pillow or cushion, Eyes closed, feet together - 1 x daily - 7 x weekly - 2 sets - 10 seconds hold

## 2020-07-17 ENCOUNTER — Ambulatory Visit
Admission: RE | Admit: 2020-07-17 | Discharge: 2020-07-17 | Disposition: A | Discharge status: Home / Self Care | Payer: Medicaid Other | Source: Ambulatory Visit | Attending: Internal Medicine | Admitting: Internal Medicine

## 2020-07-17 DIAGNOSIS — N9489 Other specified conditions associated with female genital organs and menstrual cycle: Secondary | ICD-10-CM

## 2020-07-17 IMAGING — US US PELVIS COMPLETE WITH TRANSVAGINAL
1 series · 13 of 25 positions shown · non-contrast
Comparison: [DATE].

CLINICAL DATA: Possible adnexal mass.

EXAM:
TRANSABDOMINAL AND TRANSVAGINAL ULTRASOUND OF PELVIS
TECHNIQUE: Both transabdominal and transvaginal ultrasound examinations of the
pelvis were performed. Transabdominal technique was performed for
global imaging of the pelvis including uterus, ovaries, adnexal
regions, and pelvic cul-de-sac. It was necessary to proceed with
endovaginal exam following the transabdominal exam to visualize the
endometrium and ovaries. Exam is somewhat limited due to overlying
bowel gas.

[Series 1: us pelvis complete with transvaginal · 0.16mm/px · 13 of 83 slices shown]
[im 1/83]
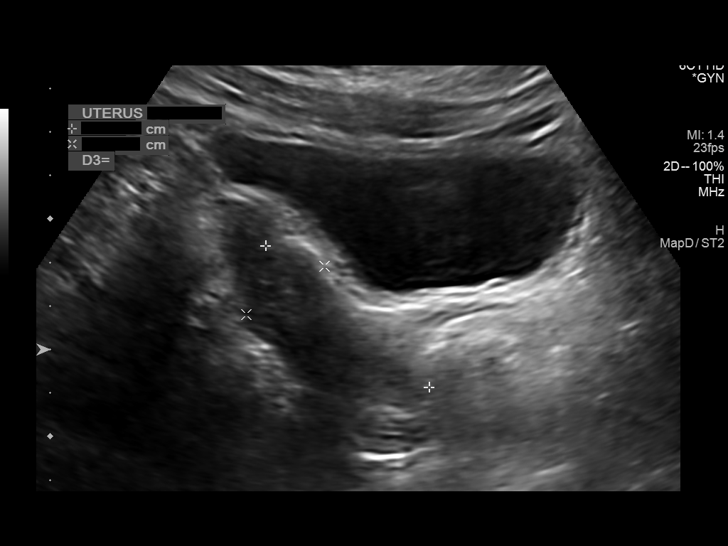
[im 7/83]
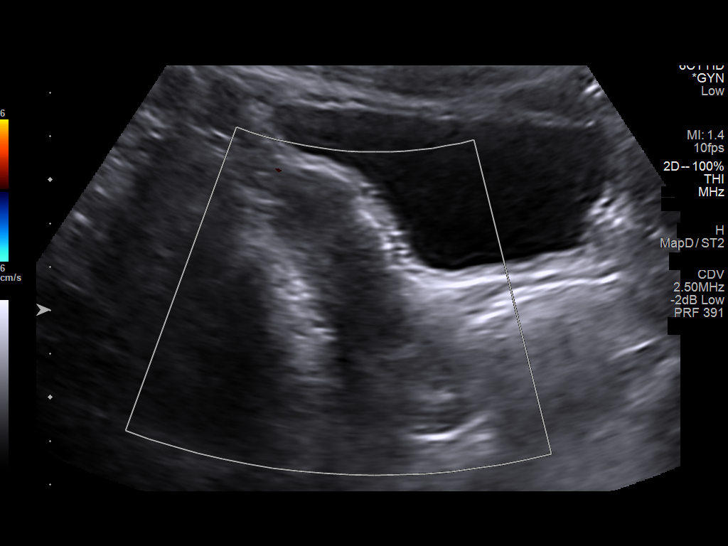
[im 14/83]
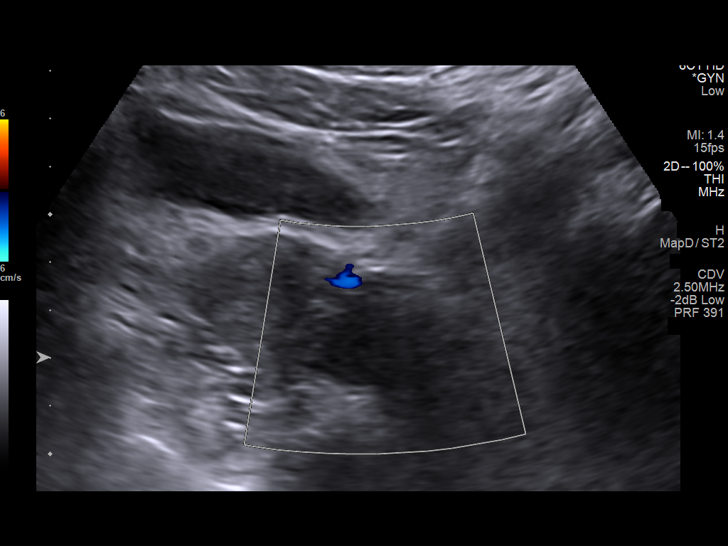
[im 21/83]
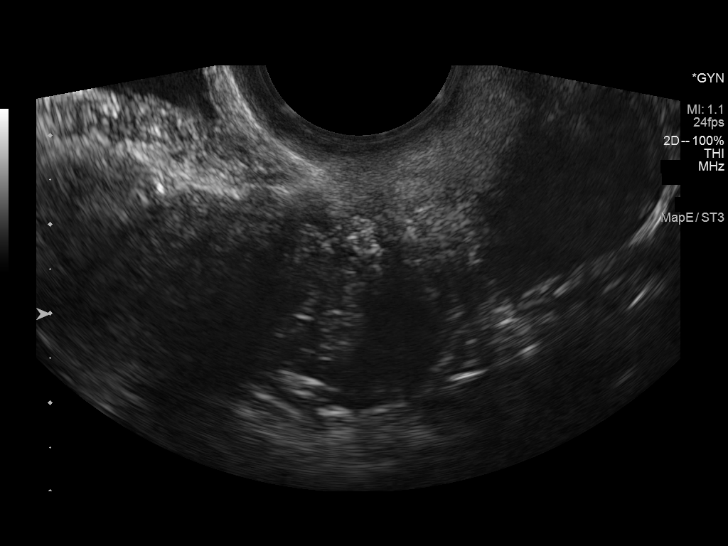
[im 28/83]
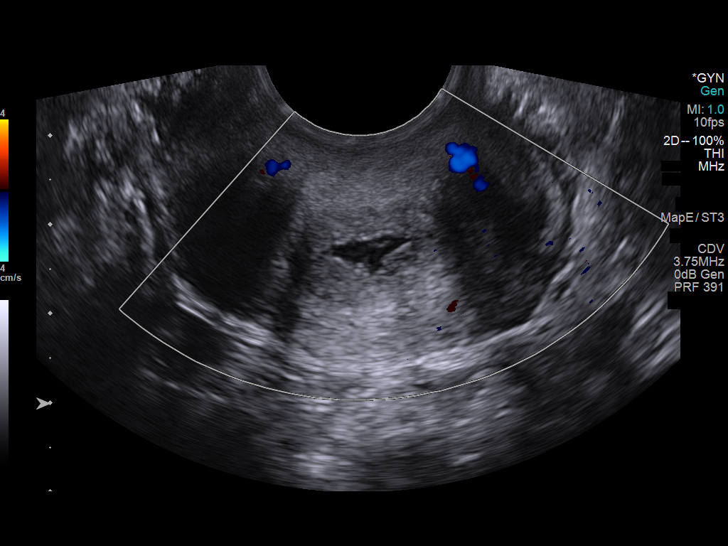
[im 35/83]
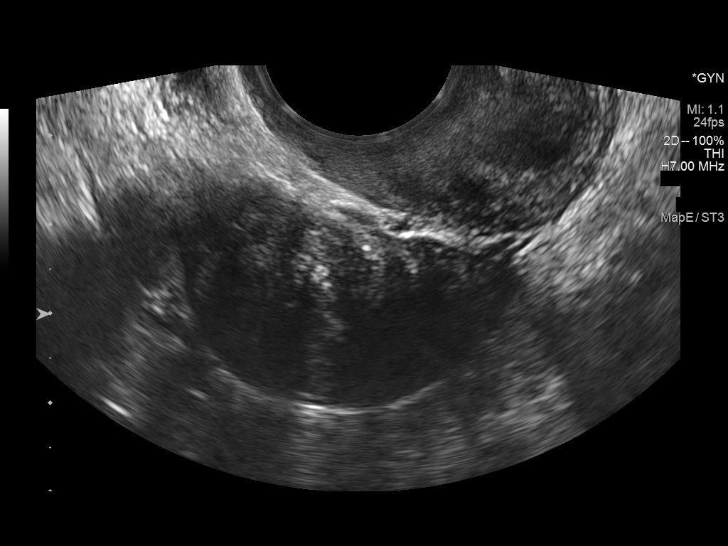
[im 42/83]
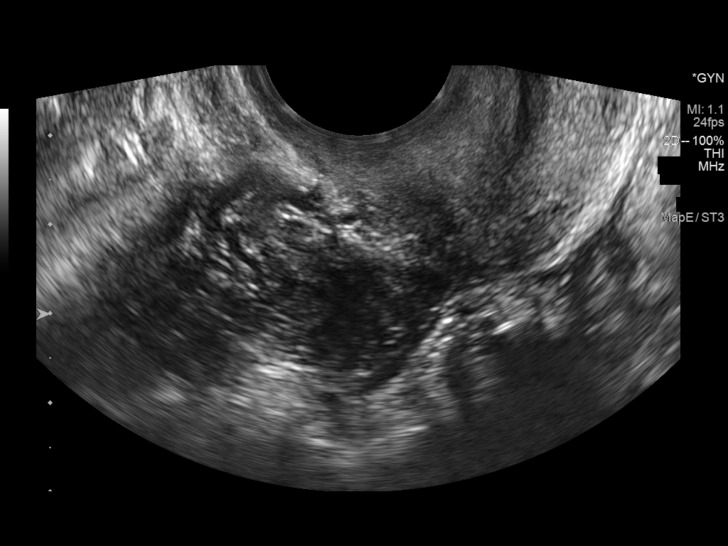
[im 48/83]
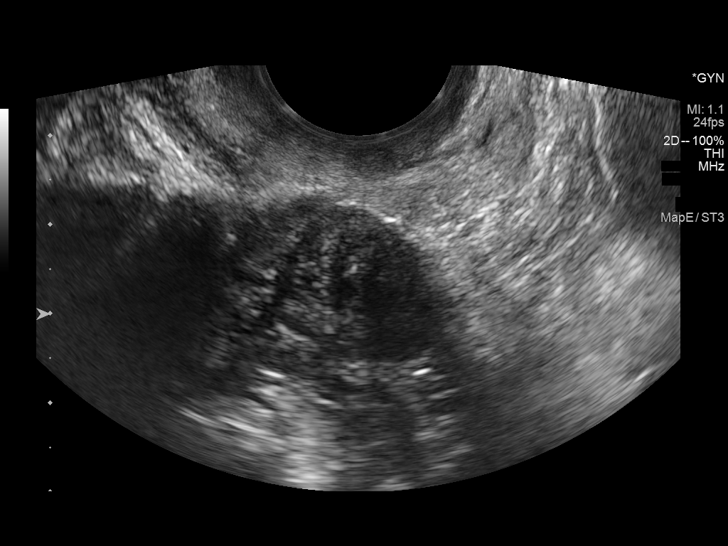
[im 55/83]
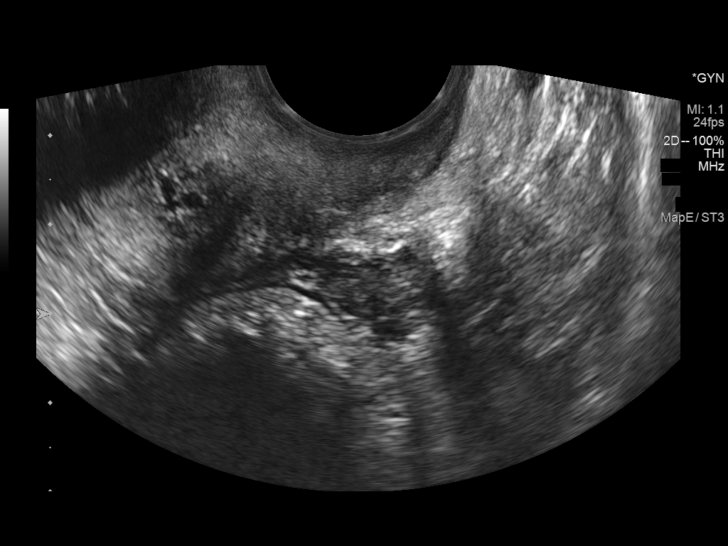
[im 62/83]
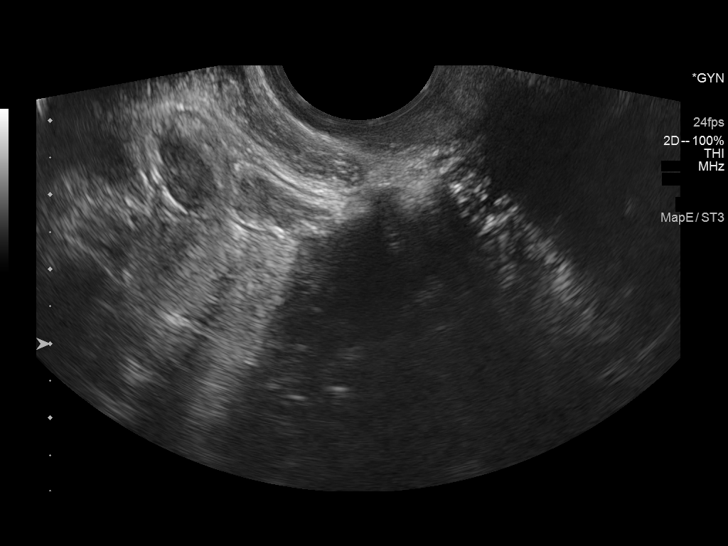
[im 69/83]
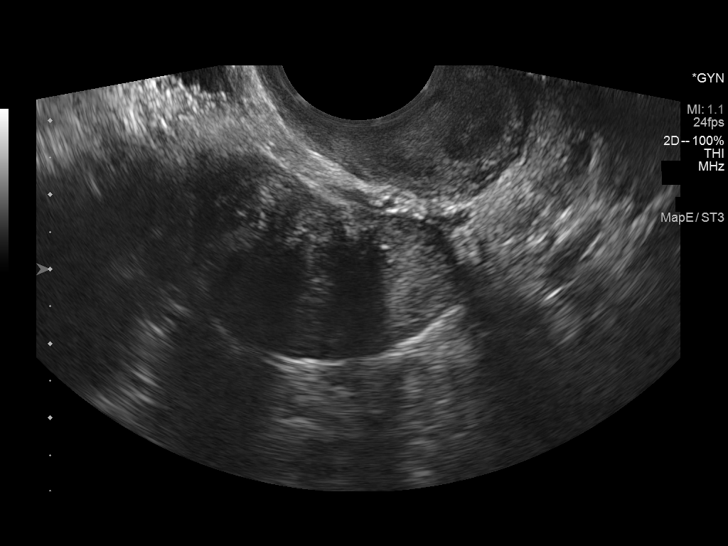
[im 76/83]
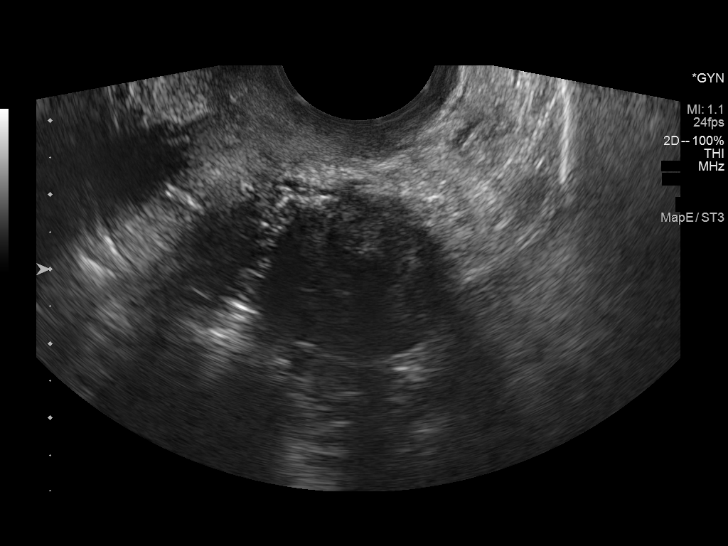
[im 83/83]
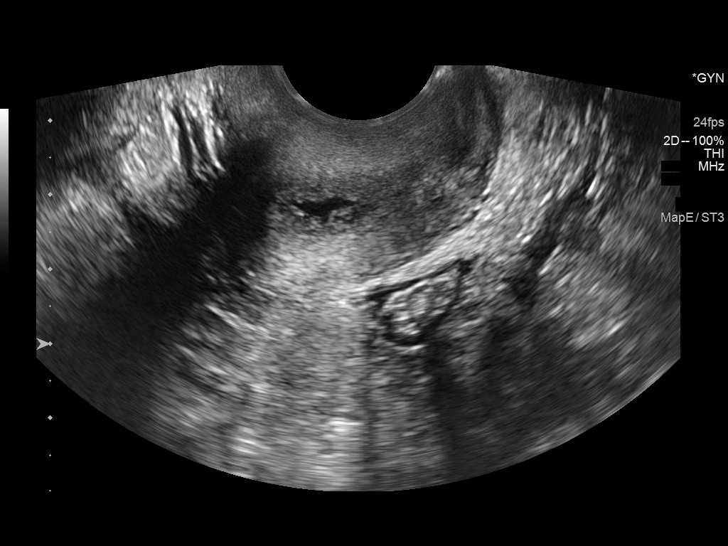

[13 of 25 positions shown; findings below may reference images not displayed]

FINDINGS: Uterus

Measurements: 5.0 x 3.7 x 2.1 cm = volume: 20 mL. No fibroids or
other mass visualized.

Endometrium

Thickness: 4 mm which is within normal limits. No focal abnormality
visualized.

Right ovary

Not visualized.

Left ovary

Measurements: 3.9 x 3.1 x 2.1 cm = volume: 13 mL. The ovary is
somewhat heterogeneous in appearance and the possibility of
underlying mass cannot be excluded.

Other findings

No abnormal free fluid.
IMPRESSION: Right ovary is not visualized.

Left ovary is somewhat heterogeneous in appearance and the
possibility of underlying mass cannot be excluded. Further
evaluation with MRI is recommended with and without gadolinium.

## 2020-07-31 ENCOUNTER — Other Ambulatory Visit: Payer: Self-pay | Admitting: Internal Medicine

## 2020-07-31 DIAGNOSIS — N83209 Unspecified ovarian cyst, unspecified side: Secondary | ICD-10-CM

## 2020-08-03 ENCOUNTER — Ambulatory Visit
Admission: RE | Admit: 2020-08-03 | Discharge: 2020-08-03 | Disposition: A | Discharge status: Home / Self Care | Payer: Medicaid Other | Source: Ambulatory Visit | Attending: Internal Medicine | Admitting: Internal Medicine

## 2020-08-03 DIAGNOSIS — N83209 Unspecified ovarian cyst, unspecified side: Secondary | ICD-10-CM

## 2020-08-03 IMAGING — MR MR PELVIS WO/W CM
19 of 20 series · 45 of 48 positions shown · IV contrast (17ml multi)
Comparison: [DATE] unenhanced CT abdomen/pelvis. [DATE]
pelvic sonogram.

CLINICAL DATA: 34-year-old female with a history of medullary
blastoma and type 1 diabetes mellitus, presents for evaluation of
indeterminate left adnexal mass discovered on recent unenhanced CT
abdomen/pelvis study performed for abdominal pain.

EXAM:
MRI PELVIS WITHOUT AND WITH CONTRAST
TECHNIQUE: Multiplanar multisequence MR imaging of the pelvis was performed
both before and after administration of intravenous contrast.
CONTRAST:  17mL MULTIHANCE GADOBENATE DIMEGLUMINE 529 MG/ML IV SOLN

[Series 2: T2 · coronal · 5.0mm · 1.25mm/px · 1 of 29 slices shown (1 of 4)]
[im 1/29]
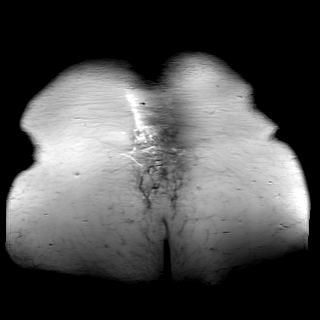

[Series 3: T2 · axial · 5.0mm · 0.44mm/px · 1 of 40 slices shown (2 of 4)]
[im 1/40]
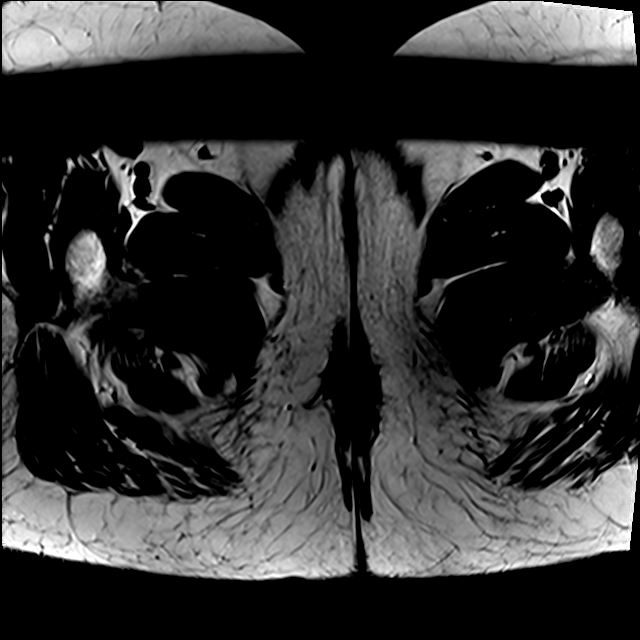

[Series 4: T2 fat-sat · axial · 5.0mm · 0.44mm/px · 1 of 40 slices shown]
[im 1/40]
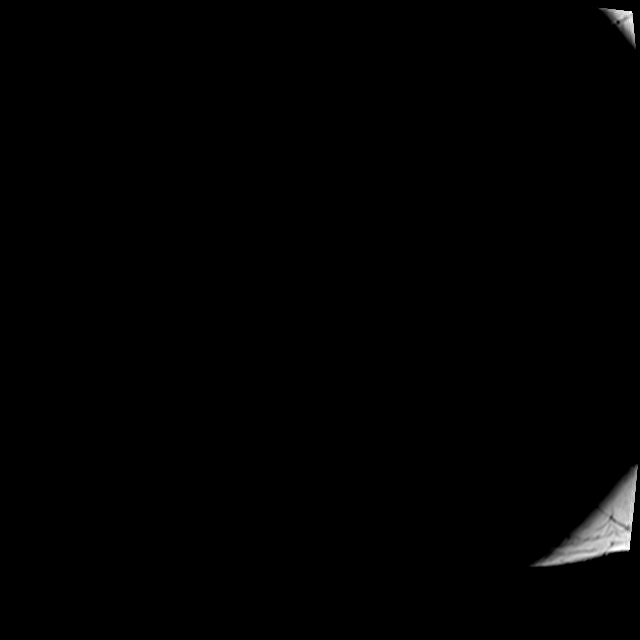

[Series 5: T2 · sagittal · 5.0mm · 0.78mm/px · 1 of 28 slices shown (3 of 4)]
[im 1/28]
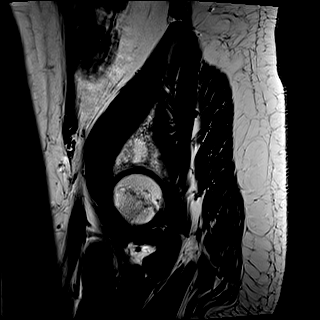

[Series 6: T2 · coronal · 5.0mm · 0.81mm/px · 1 of 30 slices shown (4 of 4)]
[im 1/30]
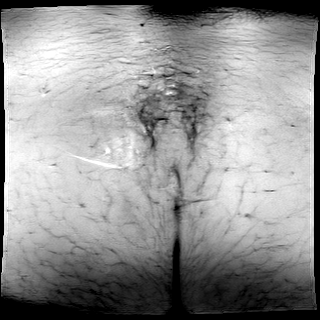

[Series 7: ax dwi_tracew_dfc_mix · axial · 5.0mm · 1.19mm/px · z∈[-137,+97]mm · 3 of 120 slices shown]
[im 1/120]
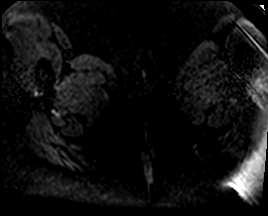
[im 60/120]
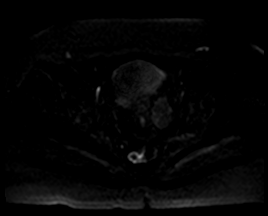
[im 120/120]
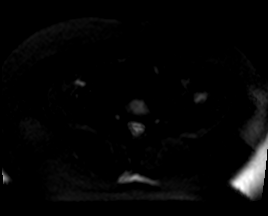

[Series 8: ax dwi_adc_dfc_mix · axial · 5.0mm · 1.19mm/px · 1 of 40 slices shown]
[im 1/40]
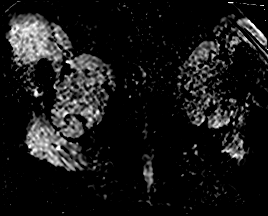

[Series 9: T1 · axial · 5.0mm · 1.25mm/px · z∈[-125,+110]mm · 2 of 96 slices shown]
[im 1/96]
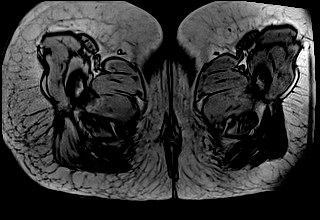
[im 96/96]
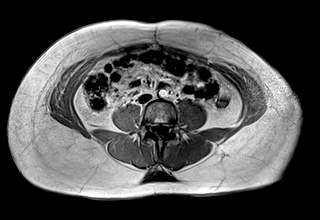

[Series 10: T1 dynamic · axial · non-contrast · 3.0mm · 1.25mm/px · z∈[-126,+111]mm · 2 of 80 slices shown]
[im 1/80]
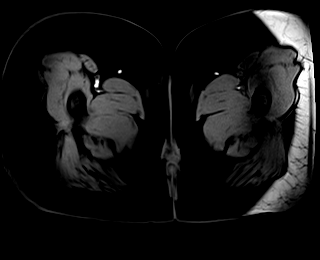
[im 80/80]
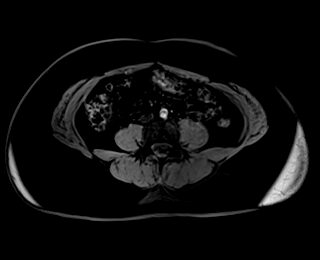

[Series 11: T1 dynamic post-contrast · axial · 3.0mm · 1.25mm/px · z∈[-126,+111]mm · 3 of 80 slices shown (1 of 8)]
[im 1/80]
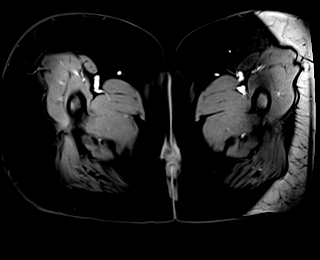
[im 40/80]
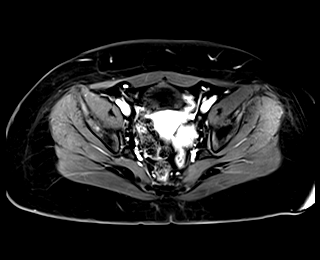
[im 80/80]
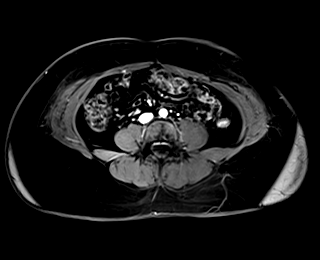

[Series 12: T1 dynamic post-contrast · axial · 3.0mm · 1.25mm/px · z∈[-126,+111]mm · 3 of 80 slices shown (2 of 8)]
[im 1/80]
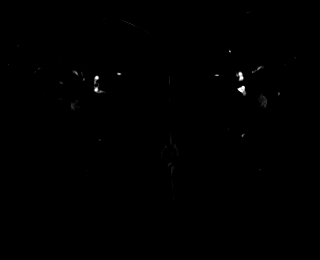
[im 40/80]
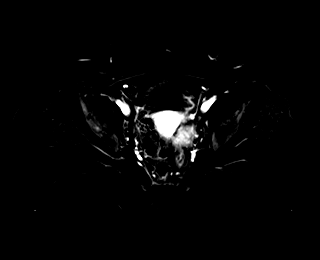
[im 80/80]
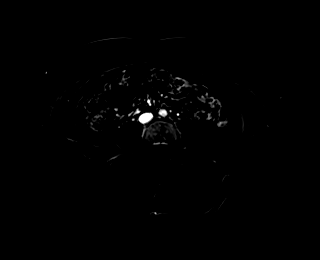

[Series 13: T1 dynamic post-contrast · axial · 3.0mm · 1.25mm/px · z∈[-126,+111]mm · 3 of 80 slices shown (3 of 8)]
[im 1/80]
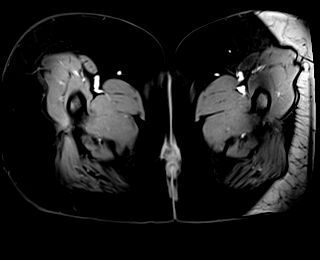
[im 40/80]
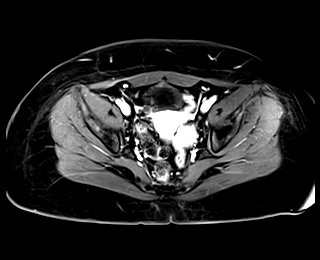
[im 80/80]
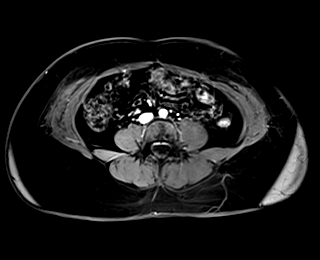

[Series 14: T1 dynamic post-contrast · axial · 3.0mm · 1.25mm/px · z∈[-126,+111]mm · 3 of 80 slices shown (4 of 8)]
[im 1/80]
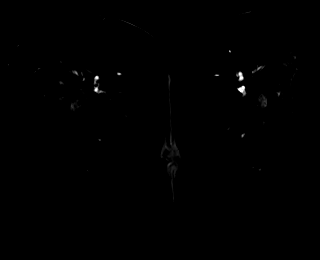
[im 40/80]
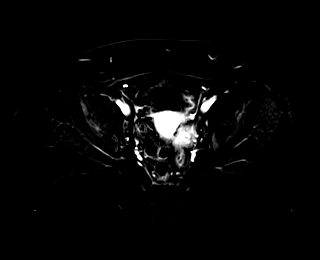
[im 80/80]
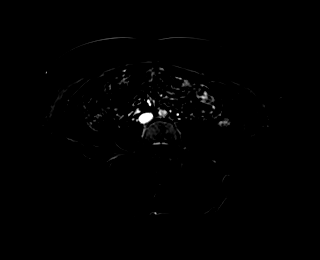

[Series 15: T1 dynamic post-contrast · axial · 3.0mm · 1.25mm/px · z∈[-126,+111]mm · 3 of 80 slices shown (5 of 8)]
[im 1/80]
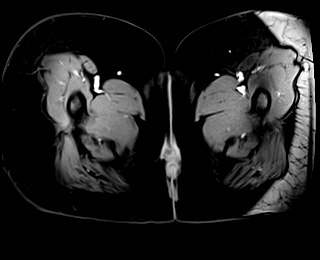
[im 40/80]
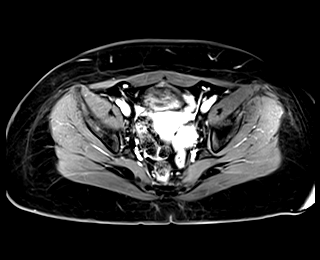
[im 80/80]
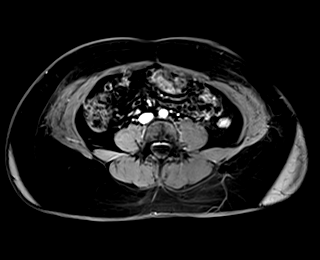

[Series 16: T1 dynamic post-contrast · axial · 3.0mm · 1.25mm/px · z∈[-126,+111]mm · 3 of 80 slices shown (6 of 8)]
[im 1/80]
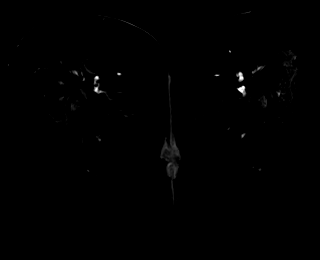
[im 40/80]
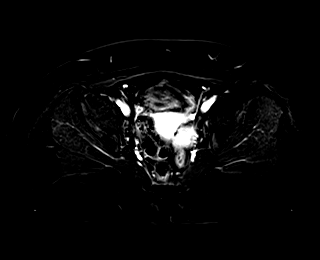
[im 80/80]
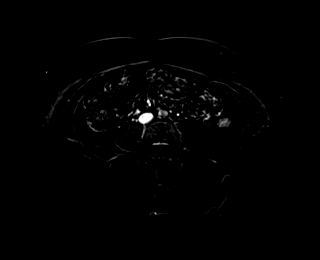

[Series 17: T1 dynamic post-contrast · axial · 3.0mm · 1.25mm/px · z∈[-126,+111]mm · 3 of 80 slices shown (7 of 8)]
[im 1/80]
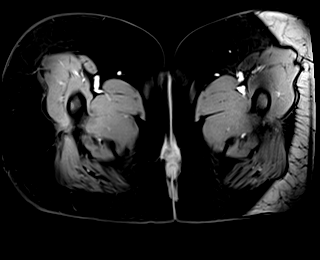
[im 40/80]
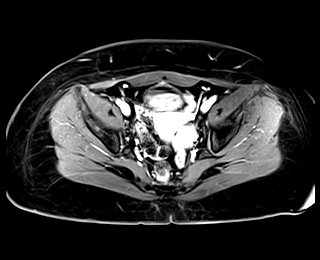
[im 80/80]
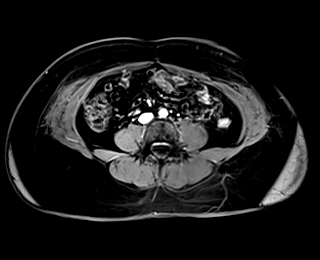

[Series 18: T1 dynamic post-contrast · axial · 3.0mm · 1.25mm/px · z∈[-126,+111]mm · 3 of 80 slices shown (8 of 8)]
[im 1/80]
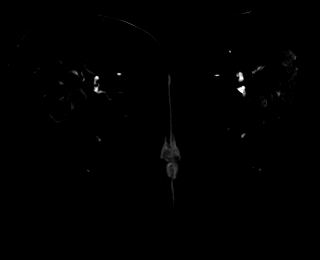
[im 40/80]
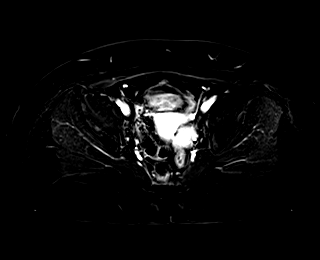
[im 80/80]
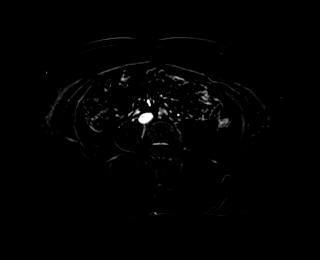

[Series 19: T1 fat-sat · sagittal · 1.2mm · 0.88mm/px · 5 of 144 slices shown]
[im 1/144]
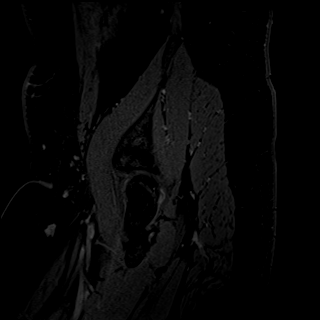
[im 36/144]
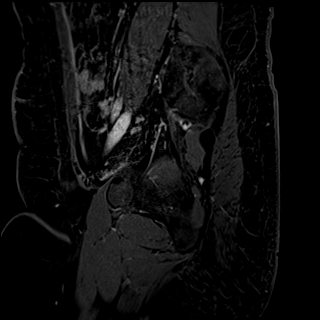
[im 72/144]
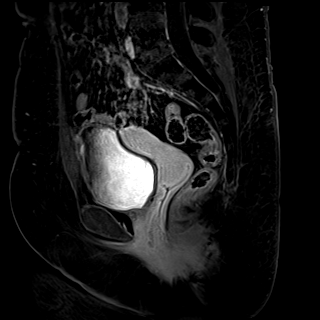
[im 108/144]
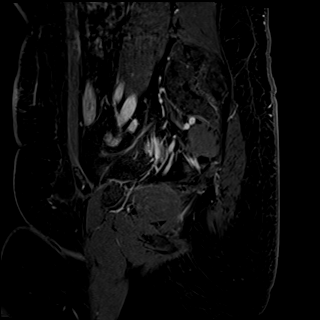
[im 144/144]
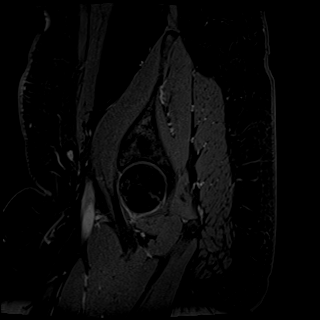

[Series 20: T1 fat-sat post-contrast · axial · 1.2mm · 0.75mm/px · z∈[-123,-37]mm · 3 of 144 slices shown]
[im 1/144]
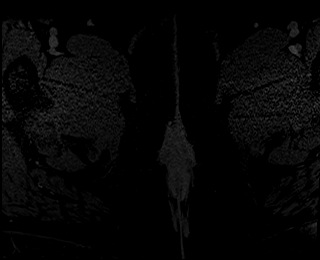
[im 36/144]
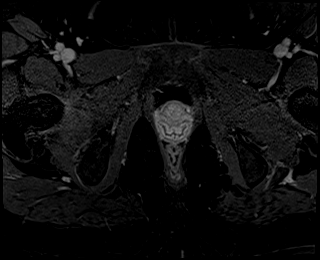
[im 72/144]
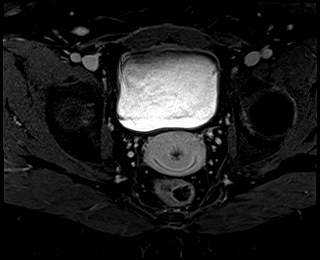

[45 of 48 positions shown; findings below may reference images not displayed]

FINDINGS: Urinary Tract:  Normal bladder.  Normal urethra.

Bowel: Visualized small and large bowel are normal caliber with no
bowel wall thickening.

Vascular/Lymphatic: No pathologically enlarged lymph nodes in the
pelvis. No acute vascular abnormality.

Reproductive:

Uterus: The anteverted uterus measures 7.3 x 2.3 x 3.9 cm and is
normal in size and configuration. Inner myometrium (junctional zone)
measures 4 mm in thickness, which is within normal limits.
Endometrium measures 2 mm in bilayer thickness, which is within
normal limits. No endometrial cavity fluid or focal endometrial
mass. Normal uterine cervix with intact outer cervical fibrous
stroma.

There is a T2 hypointense solid uniformly enhancing 3.8 x 2.5 x
cm left adnexal mass abutting the left uterine body (series 3/image
23 and series 21/image 19), which may have a small stalk arising
from the left lower uterine segment (series 6/image 16), favoring a
pedunculated left uterine fibroid. This mass is increased from 2.2 x
1.9 x 1.7 cm on [DATE] CT.

Ovaries and Adnexa: Candidate symmetric atrophic ovaries are noted
measuring 1.9 x 0.8 x 0.9 cm on the right (series 19/image 40) and
1.7 x 1.2 x 0.6 cm on the left (series 19/image 99). No additional
adnexal masses.

Other: No abnormal free fluid in the pelvis. No focal pelvic fluid
collection.

Musculoskeletal: No aggressive appearing focal osseous lesions.
IMPRESSION: 1. Solid uniformly enhancing 3.8 x 2.5 x 2.9 cm left adnexal mass
abutting the left uterine body, favor a pedunculated left uterine
fibroid, increased from 2.2 x 1.9 x 1.7 cm on [03] CT study. Suggest
continued pelvic ultrasound follow-up in 6-12 months.
2. Ovaries appear symmetric and atrophic.

## 2020-08-03 MED ORDER — GADOBENATE DIMEGLUMINE 529 MG/ML IV SOLN
17.0000 mL | Freq: Once | INTRAVENOUS | Status: AC | PRN
  Administered 2020-08-03: 17 mL via INTRAVENOUS

## 2020-08-05 ENCOUNTER — Other Ambulatory Visit: Payer: Self-pay | Admitting: Family Medicine

## 2020-08-14 ENCOUNTER — Other Ambulatory Visit: Payer: Self-pay | Admitting: Family Medicine

## 2020-08-21 ENCOUNTER — Other Ambulatory Visit: Payer: Self-pay | Admitting: Family Medicine

## 2020-08-24 ENCOUNTER — Telehealth (HOSPITAL_COMMUNITY): Payer: Self-pay | Admitting: *Deleted

## 2020-08-24 NOTE — Telephone Encounter (Signed)
MOM CALLED REQUESTED TO SPEAK WITH YOU. HAS CONCERNS ABOUT PATIENTS RECENT BEHAVIORS

## 2020-08-25 NOTE — Telephone Encounter (Signed)
Writer spoke to her patient and her mother.  Patient's mother notes that she is concerned about recent impulsive behaviors.  She notes that she continues to monitor her daughter's account because of excessive spending.  She also notes that recently her daughter has been doing a lot of online dating.  She notes that she has been telling men that she loves them after only knowing them for a few weeks.  She notes that her  daughter tell her female friends that she was told by God  that they will have a daughter together and get married.  Patient notes that she no longer wants medications.  At this time medications not started.  Provider recommended potentially doing Gene[employer]ight testing in the future to see what medications may be more effective for patient.  She endorsed understanding and was agreeable to this.  No other concerns noted at this time.

## 2020-09-02 ENCOUNTER — Other Ambulatory Visit: Payer: Self-pay

## 2020-09-02 ENCOUNTER — Emergency Department (HOSPITAL_BASED_OUTPATIENT_CLINIC_OR_DEPARTMENT_OTHER): Payer: Medicaid Other

## 2020-09-02 ENCOUNTER — Inpatient Hospital Stay (HOSPITAL_BASED_OUTPATIENT_CLINIC_OR_DEPARTMENT_OTHER)
Admission: EM | Admit: 2020-09-02 | Discharge: 2020-09-07 | DRG: 066 | Disposition: A | Discharge status: Home / Self Care | Payer: Medicaid Other | Attending: Family Medicine | Admitting: Family Medicine

## 2020-09-02 ENCOUNTER — Encounter (HOSPITAL_BASED_OUTPATIENT_CLINIC_OR_DEPARTMENT_OTHER): Payer: Self-pay | Admitting: *Deleted

## 2020-09-02 DIAGNOSIS — Z20822 Contact with and (suspected) exposure to covid-19: Secondary | ICD-10-CM | POA: Diagnosis present

## 2020-09-02 DIAGNOSIS — Z9641 Presence of insulin pump (external) (internal): Secondary | ICD-10-CM | POA: Diagnosis present

## 2020-09-02 DIAGNOSIS — Z881 Allergy status to other antibiotic agents status: Secondary | ICD-10-CM

## 2020-09-02 DIAGNOSIS — Z8349 Family history of other endocrine, nutritional and metabolic diseases: Secondary | ICD-10-CM

## 2020-09-02 DIAGNOSIS — E1042 Type 1 diabetes mellitus with diabetic polyneuropathy: Secondary | ICD-10-CM | POA: Diagnosis present

## 2020-09-02 DIAGNOSIS — R2689 Other abnormalities of gait and mobility: Secondary | ICD-10-CM | POA: Diagnosis present

## 2020-09-02 DIAGNOSIS — I6381 Other cerebral infarction due to occlusion or stenosis of small artery: Principal | ICD-10-CM | POA: Diagnosis present

## 2020-09-02 DIAGNOSIS — R4781 Slurred speech: Secondary | ICD-10-CM | POA: Diagnosis present

## 2020-09-02 DIAGNOSIS — Z7982 Long term (current) use of aspirin: Secondary | ICD-10-CM

## 2020-09-02 DIAGNOSIS — Z85841 Personal history of malignant neoplasm of brain: Secondary | ICD-10-CM

## 2020-09-02 DIAGNOSIS — Z79890 Hormone replacement therapy (postmenopausal): Secondary | ICD-10-CM

## 2020-09-02 DIAGNOSIS — R29700 NIHSS score 0: Secondary | ICD-10-CM | POA: Diagnosis present

## 2020-09-02 DIAGNOSIS — Z923 Personal history of irradiation: Secondary | ICD-10-CM

## 2020-09-02 DIAGNOSIS — E669 Obesity, unspecified: Secondary | ICD-10-CM | POA: Diagnosis present

## 2020-09-02 DIAGNOSIS — Z91018 Allergy to other foods: Secondary | ICD-10-CM

## 2020-09-02 DIAGNOSIS — Z888 Allergy status to other drugs, medicaments and biological substances status: Secondary | ICD-10-CM

## 2020-09-02 DIAGNOSIS — N926 Irregular menstruation, unspecified: Secondary | ICD-10-CM | POA: Diagnosis present

## 2020-09-02 DIAGNOSIS — E1043 Type 1 diabetes mellitus with diabetic autonomic (poly)neuropathy: Secondary | ICD-10-CM | POA: Diagnosis present

## 2020-09-02 DIAGNOSIS — Z87820 Personal history of traumatic brain injury: Secondary | ICD-10-CM

## 2020-09-02 DIAGNOSIS — E785 Hyperlipidemia, unspecified: Secondary | ICD-10-CM | POA: Diagnosis present

## 2020-09-02 DIAGNOSIS — I639 Cerebral infarction, unspecified: Principal | ICD-10-CM

## 2020-09-02 DIAGNOSIS — K5900 Constipation, unspecified: Secondary | ICD-10-CM | POA: Diagnosis present

## 2020-09-02 DIAGNOSIS — K3184 Gastroparesis: Secondary | ICD-10-CM | POA: Diagnosis present

## 2020-09-02 DIAGNOSIS — Z885 Allergy status to narcotic agent status: Secondary | ICD-10-CM

## 2020-09-02 DIAGNOSIS — I10 Essential (primary) hypertension: Secondary | ICD-10-CM | POA: Diagnosis present

## 2020-09-02 DIAGNOSIS — E109 Type 1 diabetes mellitus without complications: Secondary | ICD-10-CM | POA: Diagnosis present

## 2020-09-02 DIAGNOSIS — E063 Autoimmune thyroiditis: Secondary | ICD-10-CM | POA: Diagnosis present

## 2020-09-02 DIAGNOSIS — Z79899 Other long term (current) drug therapy: Secondary | ICD-10-CM

## 2020-09-02 DIAGNOSIS — Z794 Long term (current) use of insulin: Secondary | ICD-10-CM

## 2020-09-02 DIAGNOSIS — R296 Repeated falls: Secondary | ICD-10-CM | POA: Diagnosis present

## 2020-09-02 DIAGNOSIS — H5509 Other forms of nystagmus: Secondary | ICD-10-CM | POA: Diagnosis present

## 2020-09-02 DIAGNOSIS — G3184 Mild cognitive impairment, so stated: Secondary | ICD-10-CM | POA: Diagnosis present

## 2020-09-02 DIAGNOSIS — D649 Anemia, unspecified: Secondary | ICD-10-CM | POA: Diagnosis present

## 2020-09-02 DIAGNOSIS — J4530 Mild persistent asthma, uncomplicated: Secondary | ICD-10-CM | POA: Diagnosis present

## 2020-09-02 DIAGNOSIS — Z8673 Personal history of transient ischemic attack (TIA), and cerebral infarction without residual deficits: Secondary | ICD-10-CM

## 2020-09-02 LAB — CBC
HCT: 35.1 % — ABNORMAL LOW (ref 36.0–46.0)
Hemoglobin: 11.8 g/dL — ABNORMAL LOW (ref 12.0–15.0)
MCH: 30 pg (ref 26.0–34.0)
MCHC: 33.6 g/dL (ref 30.0–36.0)
MCV: 89.3 fL (ref 80.0–100.0)
Platelets: 432 10*3/uL — ABNORMAL HIGH (ref 150–400)
RBC: 3.93 MIL/uL (ref 3.87–5.11)
RDW: 13.6 % (ref 11.5–15.5)
WBC: 10.4 10*3/uL (ref 4.0–10.5)
nRBC: 0 % (ref 0.0–0.2)

## 2020-09-02 LAB — URINALYSIS, ROUTINE W REFLEX MICROSCOPIC
Bilirubin Urine: NEGATIVE
Glucose, UA: 250 mg/dL — AB
Ketones, ur: NEGATIVE mg/dL
Leukocytes,Ua: NEGATIVE
Nitrite: NEGATIVE
Protein, ur: NEGATIVE mg/dL
Specific Gravity, Urine: 1.01 (ref 1.005–1.030)
pH: 6 (ref 5.0–8.0)

## 2020-09-02 LAB — COMPREHENSIVE METABOLIC PANEL
ALT: 14 U/L (ref 0–44)
AST: 13 U/L — ABNORMAL LOW (ref 15–41)
Albumin: 3.4 g/dL — ABNORMAL LOW (ref 3.5–5.0)
Alkaline Phosphatase: 78 U/L (ref 38–126)
Anion gap: 8 (ref 5–15)
BUN: 11 mg/dL (ref 6–20)
CO2: 29 mmol/L (ref 22–32)
Calcium: 9 mg/dL (ref 8.9–10.3)
Chloride: 99 mmol/L (ref 98–111)
Creatinine, Ser: 1.04 mg/dL — ABNORMAL HIGH (ref 0.44–1.00)
GFR, Estimated: >60 mL/min (ref >60)
Glucose, Bld: 220 mg/dL — ABNORMAL HIGH (ref 70–99)
Potassium: 3.7 mmol/L (ref 3.5–5.1)
Sodium: 136 mmol/L (ref 135–145)
Total Bilirubin: 0.1 mg/dL — ABNORMAL LOW (ref 0.3–1.2)
Total Protein: 6.9 g/dL (ref 6.5–8.1)

## 2020-09-02 LAB — URINALYSIS, MICROSCOPIC (REFLEX)

## 2020-09-02 LAB — PREGNANCY, URINE: Preg Test, Ur: NEGATIVE

## 2020-09-02 IMAGING — DX DG CHEST 1V PORT
1 series · 1 of 1 positions shown · non-contrast
Comparison: None.

CLINICAL DATA: Cough and upper respiratory symptoms.  Weakness.

EXAM:
PORTABLE CHEST 1 VIEW

[chest ap]
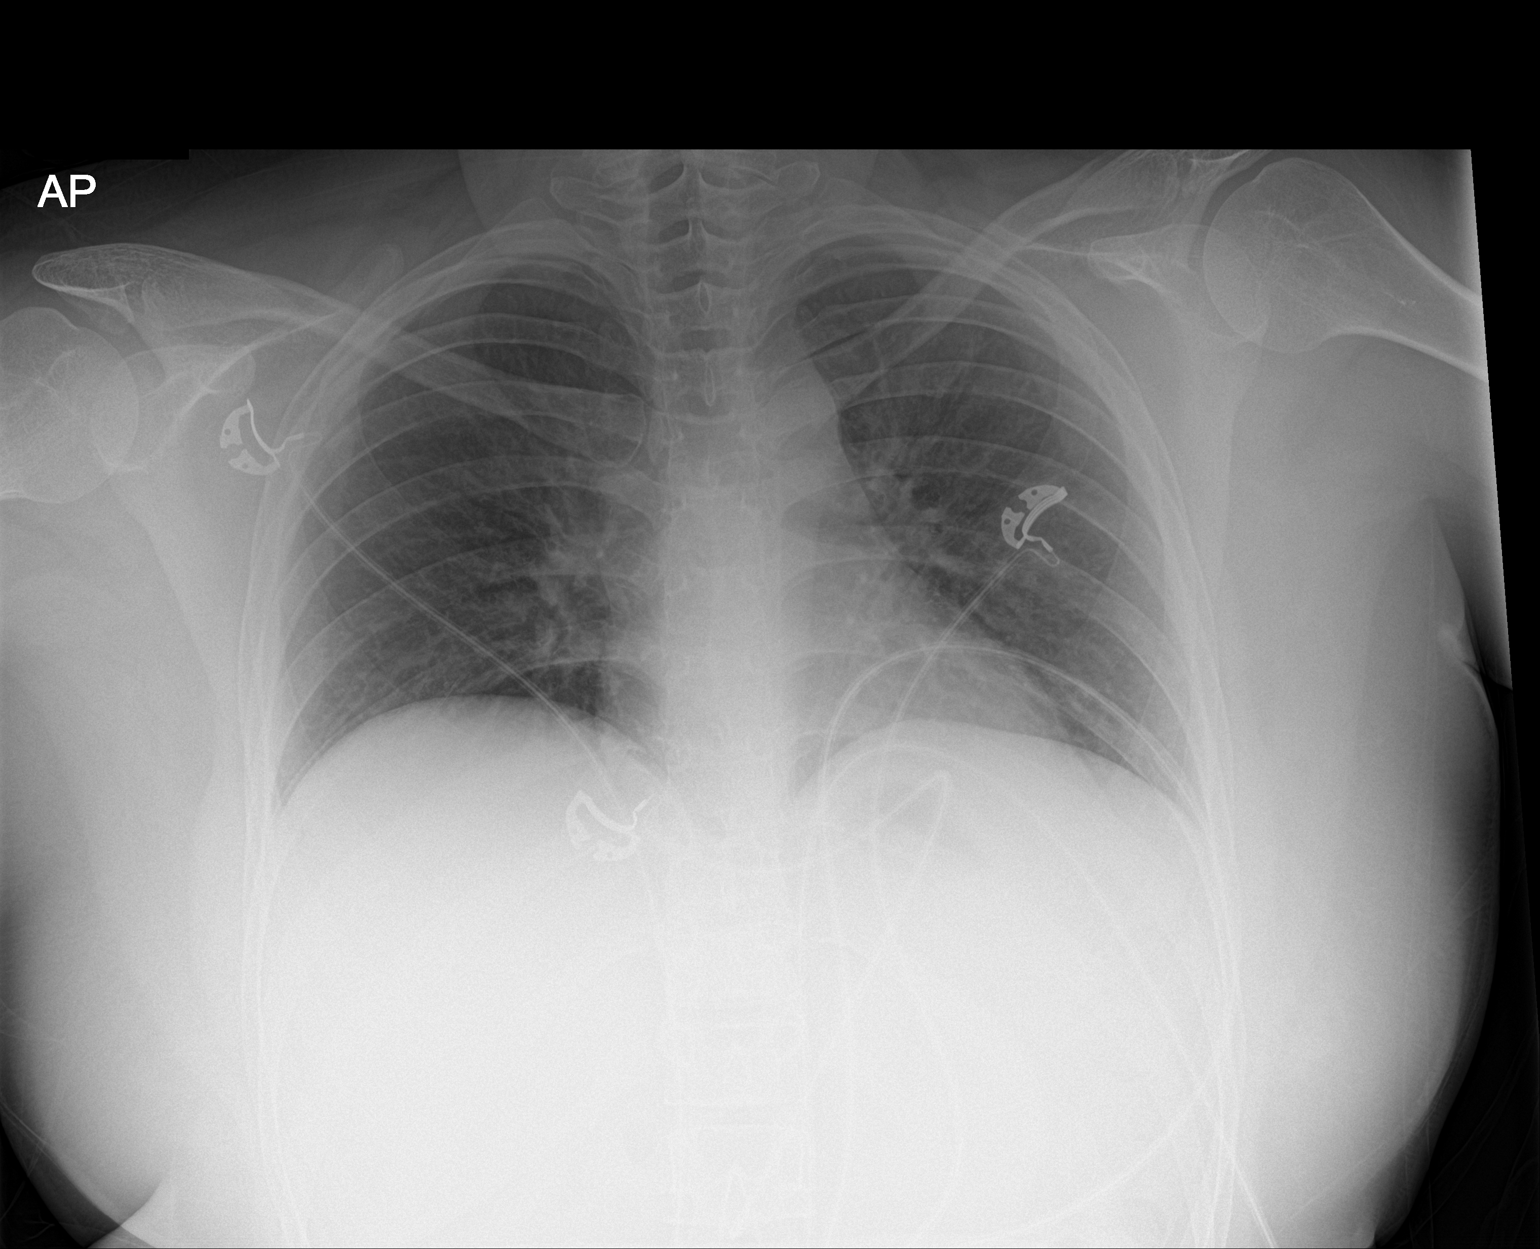

[1 of 1 positions shown; findings below may reference images not displayed]

FINDINGS: Lung volumes are low.The cardiomediastinal contours are normal.
Pulmonary vasculature is normal for technique. No consolidation,
pleural effusion, or pneumothorax. No acute osseous abnormalities
are seen.
IMPRESSION: Low lung volumes without acute findings.

## 2020-09-02 IMAGING — CT CT HEAD W/O CM
3 series · 14 of 47 positions shown, 16 images · non-contrast
Comparison: By report from [DATE]

CLINICAL DATA: History of medulloblastoma with prior resection and
recent head trauma, initial encounter

EXAM:
CT HEAD WITHOUT CONTRAST
TECHNIQUE: Contiguous axial images were obtained from the base of the skull
through the vertex without intravenous contrast.

[Series 2: head wo · axial · 0.46mm/px · z∈[+963,+1093]mm · 8 of 32 slices shown, 10 images]
[im 3/32  brain]
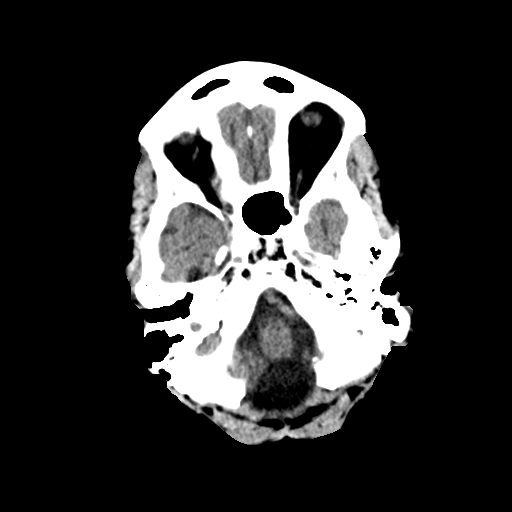
[im 3/32  bone]
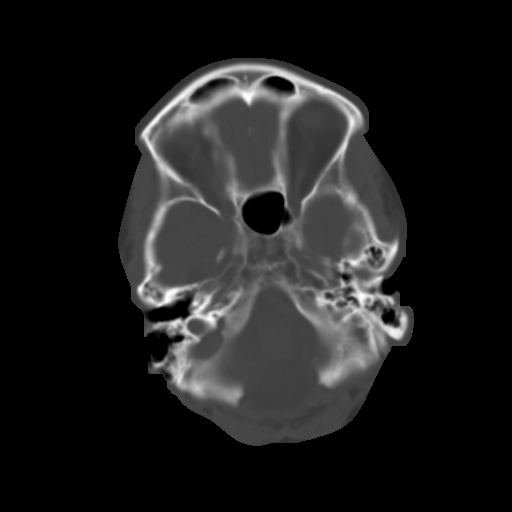
[im 7/32  brain]
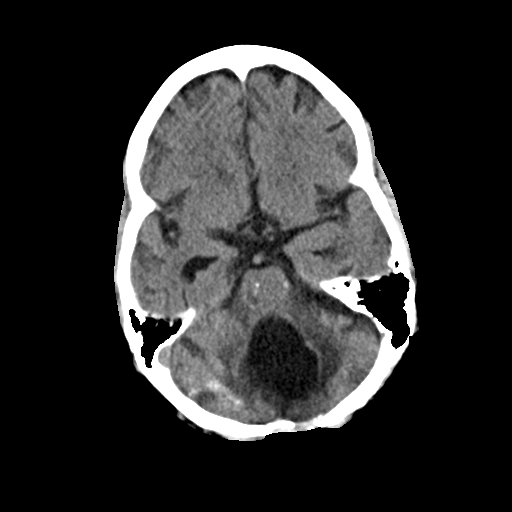
[im 10/32  brain]
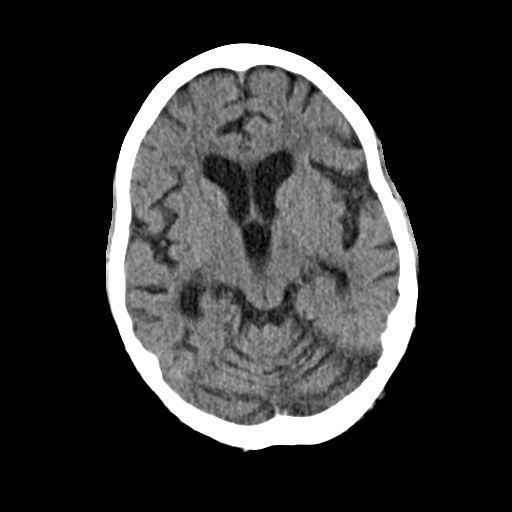
[im 14/32  brain]
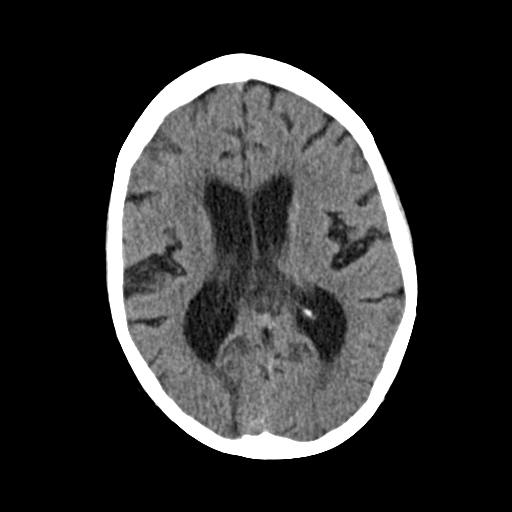
[im 18/32  brain]
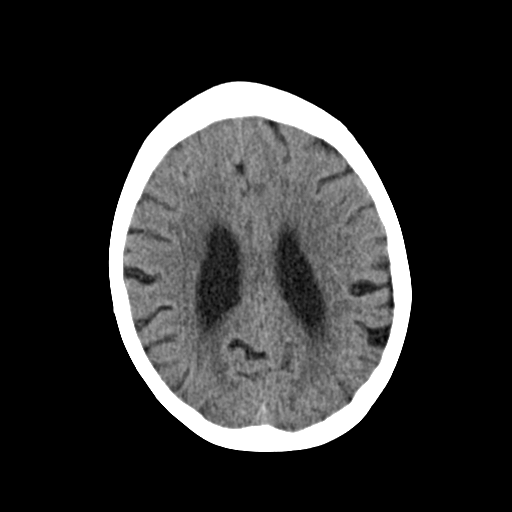
[im 18/32  bone]
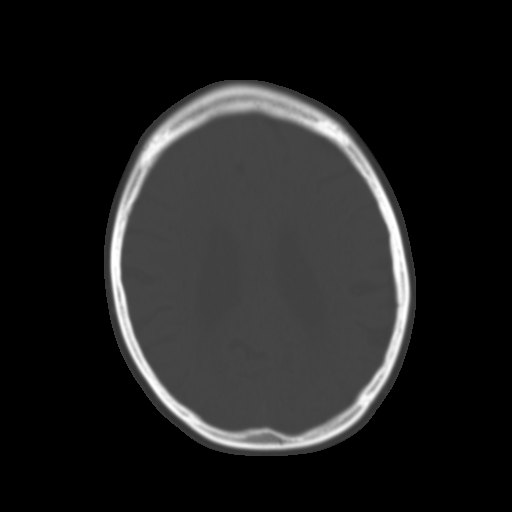
[im 22/32  brain]
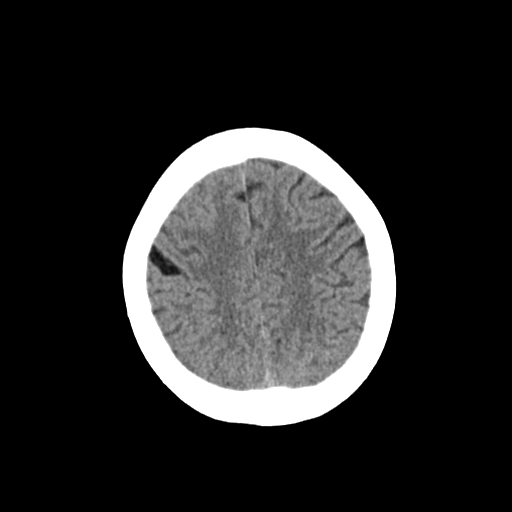
[im 25/32  brain]
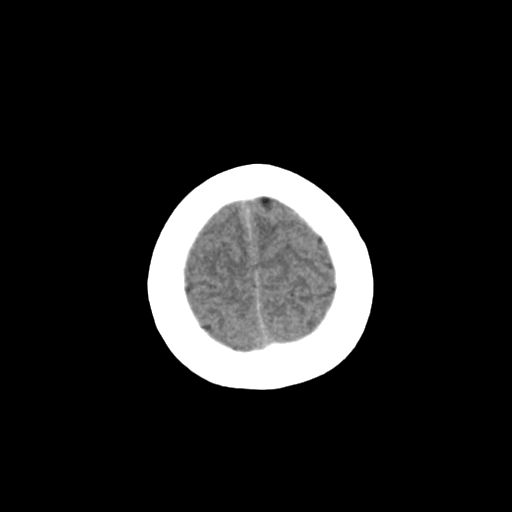
[im 29/32  brain]
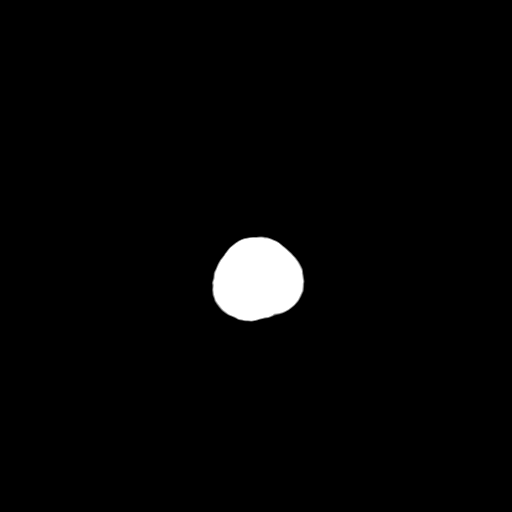

[Series 4: coronal soft · coronal · 0.31mm/px · 3 of 71 slices shown]
[im 24/71  brain]
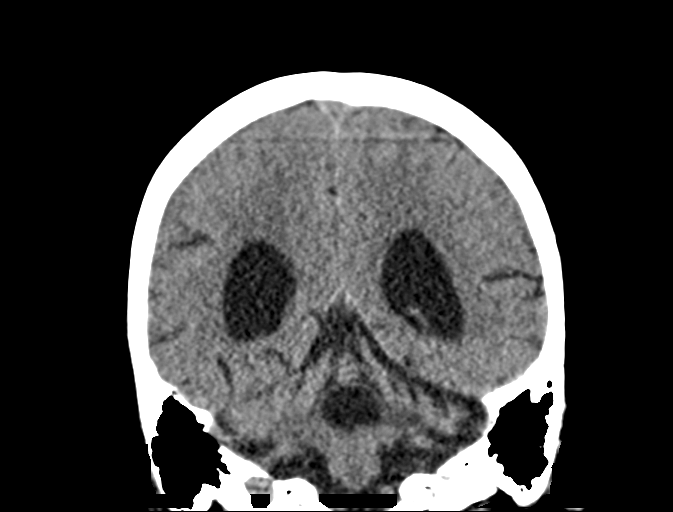
[im 32/71  brain]
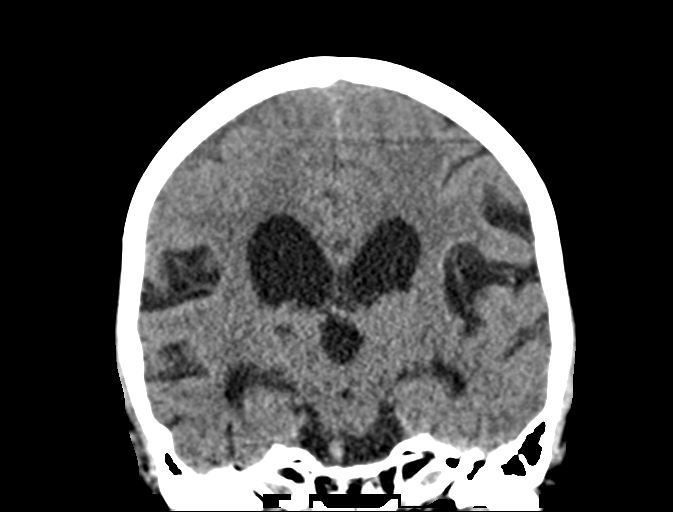
[im 39/71  brain]
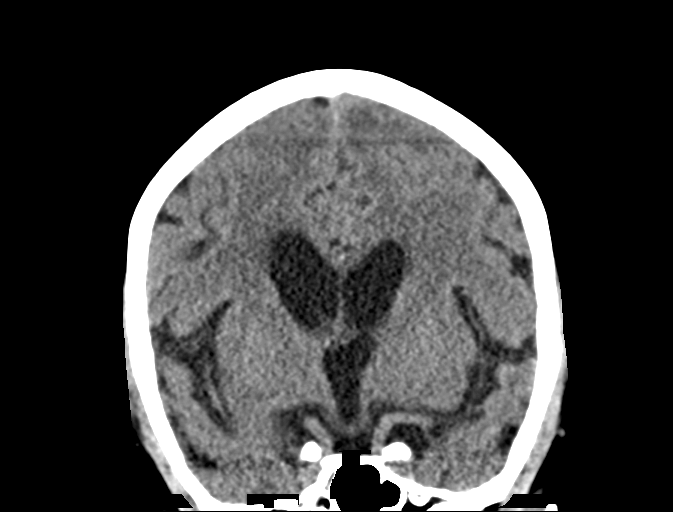

[Series 5: sag soft · sagittal · 0.31mm/px · 3 of 67 slices shown]
[im 23/67  brain]
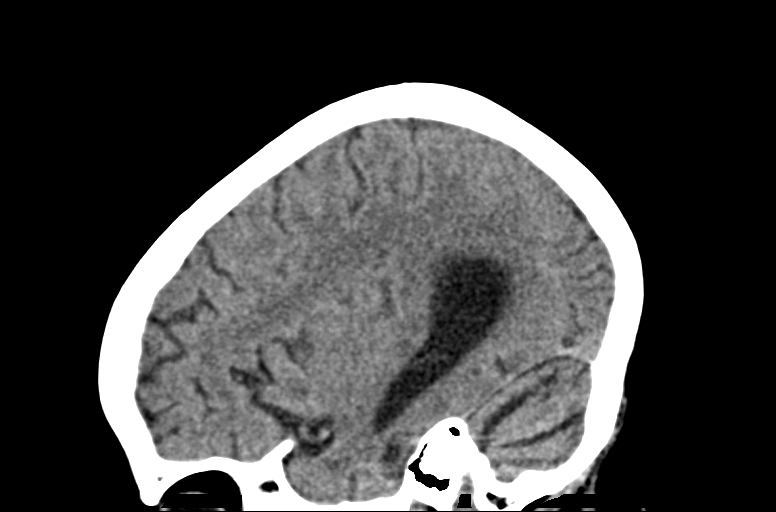
[im 34/67  brain]
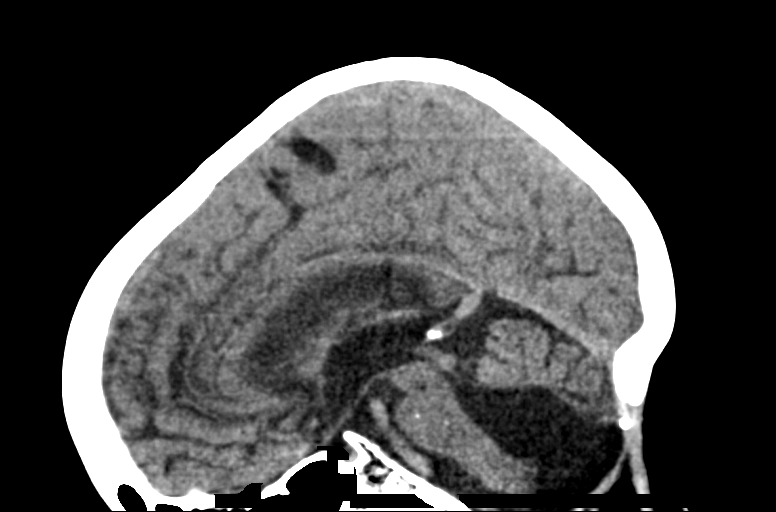
[im 45/67  brain]
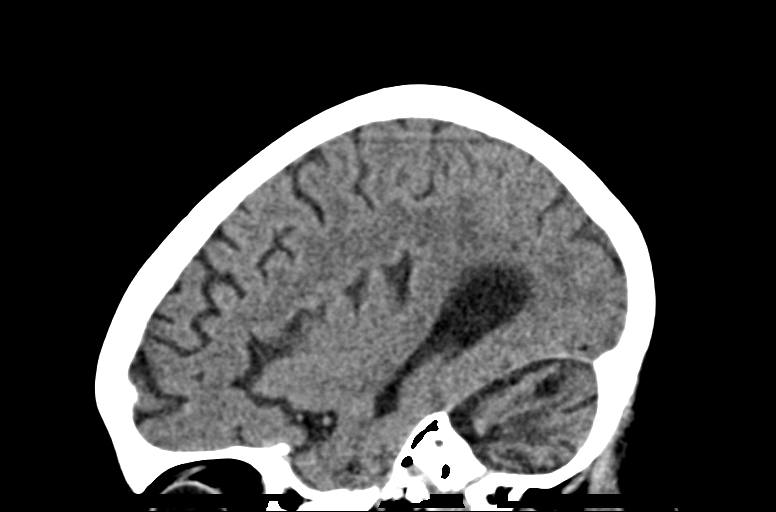

[14 of 47 positions shown; findings below may reference images not displayed]

FINDINGS: Brain: Postsurgical changes are noted in the posterior fossa with a
cystic component identified in the midline similar to that described
on the prior exam. The overall appearance is stable. Some
calcifications are noted within the right cerebellar hemisphere
likely related to the prior surgery. Large posterior occipital
defect is noted. No acute hemorrhage or infarct is noted.

Vascular: No hyperdense vessel or unexpected calcification.

Skull: Posterior occipital defect is noted. No acute abnormality
seen.

Sinuses/Orbits: No acute finding.

Other: None.
IMPRESSION: Postsurgical changes in the posterior fossa consistent with the
given clinical history. No acute abnormality is noted at this time.

## 2020-09-02 NOTE — ED Triage Notes (Signed)
Pt was recently sick with a viral illness for the past 3 days, she had nause and diarrhea. Mother noted that pt gait was unsteady today and she was feeling generally weak.  No focal weakness.  Pt also struck her head this past Sunday when she fell out of bed, this weakness and gait change began today.

## 2020-09-02 NOTE — ED Notes (Signed)
Pt has a dexcom glucose monitoring device and is able to tell me that her current cbg is 190, per mother this is consistent with what she usually is.

## 2020-09-02 NOTE — ED Provider Notes (Signed)
[place] EMERGENCY DEPARTMENT Provider Note   CSN: [id number] Arrival date & time: 09/02/20  1808     History Chief Complaint  Patient presents with   Weakness    Tracey Perkins is a 35 y.o. female.  The history is provided by the patient.  Weakness Severity:  Severe Onset quality:  Sudden Duration:  2 days Timing:  Constant Progression:  Worsening Chronicity:  New Relieved by:  Nothing Worsened by:  Nothing Ineffective treatments:  None tried Associated symptoms: ataxia, difficulty walking, extremity numbness, falls, headaches and nausea   Associated symptoms: no vomiting   Risk factors: diabetes       Past Medical History:  Diagnosis Date   Asthma    Brain tumor ([place])    Diabetes mellitus without complication ([place])    Eczema    Food allergy    Gastroparesis    Hypokalemia    Hypomagnesemia    Hypothyroid    Neuropathy    Post concussion syndrome 05/2019   Thyroid disease    Vision changes     Patient Active Problem List   Diagnosis Date Noted   Impulsiveness 10/14/2019   Mild episode of recurrent major depressive disorder ([place]) 09/04/2019   Anxiety 08/16/2019   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   Status post radiation therapy 11/22/2018   Seasonal and perennial allergic rhinitis 11/22/2018   Seasonal allergic conjunctivitis 11/22/2018   Acute sinusitis 11/22/2018   Keratosis pilaris 10/05/2017   Melanocytic nevi of trunk 10/05/2017   Adverse food reaction 02/09/2017   Gastroesophageal reflux disease 02/09/2017   Mild intermittent asthma with acute exacerbation 11/19/2015   Mild persistent asthma without complication [id number]   Acute seasonal allergic rhinitis due to pollen 11/16/2015   Anaphylactic shock due to adverse food reaction 11/16/2015   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     Family History  Problem Relation Age of Onset   Thyroid disease Mother    Eczema Sister    Thyroid disease Sister    Thyroid disease Sister     Social History   Tobacco Use   Smoking status: Never   Smokeless tobacco: Never  Vaping Use   Vaping Use: Never used  Substance Use Topics   Alcohol use: No   Drug use: No    Home Medications Prior to Admission medications   Medication Sig Start Date End Date Taking? Authorizing Provider  acetaminophen (TYLENOL) 500 MG tablet Take 500 mg by mouth every 6 (six) hours as needed for moderate pain.     [provider]  aspirin EC 81 MG tablet Take 81 mg by mouth daily. Swallow whole.    [provider]  atorvastatin (LIPITOR) 40 MG tablet Take 40 mg by mouth at bedtime. 11/08/19   [provider]  azelastine (ASTELIN) 0.1 % nasal spray Place 2 sprays into both nostrils 2 (two) times daily. 12/25/19   Dara Hoyer, FNP  cetirizine (ZYRTEC) 10 MG tablet TAKE  1 TABLET BY MOUTH EVERY DAY 08/21/20   Ambs, Kathrine Cords, FNP  citalopram (CELEXA) 10 MG tablet Take 0.5 tablets (5 mg total) by mouth daily. Patient will cut pill in half. She has a traumatic brain injury and will take half the dose. 07/08/20   Salley Slaughter, NP  Continuous Blood Gluc Sensor (FREESTYLE LIBRE SENSOR SYSTEM) MISC USE 1 DEVICE EVERY 10 (TEN) DAYS. 01/29/17   [provider]  Continuous Blood Gluc Transmit (DEXCOM G6 TRANSMITTER) MISC USE 1 EACH EVERY 3 (THREE) MONTHS 06/24/19   [provider]  cyanocobalamin 1000 MCG tablet Take by mouth daily. 12/24/19 12/23/20  [provider]   EPINEPHrine 0.3 mg/0.3 mL IJ SOAJ injection Use as directed for severe allergic reaction. 07/13/20   Dara Hoyer, FNP  fluticasone (FLONASE) 50 MCG/ACT nasal spray 2 sprays per nostril daily as needed for stuffy nose. 12/25/19   Dara Hoyer, FNP  fluticasone (FLOVENT HFA) 110 MCG/ACT inhaler Inhale 2 puffs into the lungs daily. Rinse, gargle and spit out after use. 12/25/19   Dara Hoyer, FNP  gabapentin (NEURONTIN) 300 MG capsule Take 300 mg by mouth at bedtime.  01/25/17   [provider]  glucagon (GLUCAGON EMERGENCY) 1 MG injection Inject 1 mg into the skin once as needed (for diabeties).  Patient not taking: Reported on 12/25/2019 06/30/16   [provider]  Glucagon 3 MG/DOSE POWD as needed.    [provider]  glucose blood test strip Use 3 (three) times daily Use as instructed. 05/11/17   [provider]  insulin aspart (NOVOLOG) 100 UNIT/ML injection USE UP TO 130 UNITS DAILY, AS DIRECTED IN INSULIN PUMP. 12/24/19   [provider]  insulin glargine (LANTUS) 100 UNIT/ML injection Inject 16 Units into the skin daily.  Patient not taking: Reported on 12/25/2019 11/19/15   [provider]  Insulin Human (INSULIN PUMP) SOLN Inject 80 each into the skin daily. Novolog Insulin Pump    [provider]  KLOR-CON M10 10 MEQ tablet Take 10 mEq by mouth daily. 03/18/19   [provider]  levothyroxine (SYNTHROID, LEVOTHROID) 88 MCG tablet Take 100 mcg by mouth daily before breakfast.     [provider]  lubiprostone (AMITIZA) 8 MCG capsule Take 16 mcg by mouth daily with breakfast.     [provider]  magnesium oxide (MAG-OX) 400 MG tablet Take 800 mg by mouth at bedtime.    [provider]  meclizine (ANTIVERT) 25 MG tablet Take 25 mg by mouth 2 (two) times daily as needed for dizziness.  Patient not taking: Reported on 12/25/2019 06/19/19   [provider]  metoCLOPramide (REGLAN) 10 MG tablet Take 1  tablet (10 mg total) by mouth every 6 (six) hours. 05/27/20   McDonald, Mia A, PA-C  montelukast (SINGULAIR) 10 MG tablet TAKE 1 TABLET (10 MG TOTAL) BY MOUTH AT BEDTIME. TO PREVENT COUGHING OR WHEEZING 08/21/20   Ambs, Kathrine Cords, FNP  norgestimate-ethinyl estradiol (ORTHO-CYCLEN) 0.25-35 MG-MCG tablet Take 1 tablet by mouth daily.     [provider]  ondansetron (ZOFRAN) 4 MG tablet Take 1 tablet (4 mg total) by mouth every 8 (eight) hours as needed for nausea or vomiting. 06/03/20   Domenic Moras, PA-C  PROAIR HFA 108 (90 Base) MCG/ACT inhaler TAKE 2 PUFFS BY MOUTH EVERY 4 HOURS AS NEEDED Patient taking differently: Inhale 2 puffs into the lungs every 6 (six) hours as needed for wheezing or shortness of breath.  04/01/19   Dara Hoyer, FNP    Allergies    Banana, Bactrim [sulfamethoxazole-trimethoprim], Doxycycline, Gentian violet, Gentian violet-proflavine sulfate [triple dye], Mold extract [trichophyton], Morphine and related, Other, and Vancomycin  Review of Systems   Review of Systems  Gastrointestinal:  Positive for nausea. Negative for vomiting.  Musculoskeletal:  Positive for falls.  Neurological:  Positive for weakness and headaches.   Physical Exam Updated Vital Signs BP (!) 130/91 (BP Location: Left Arm)   Pulse (!) 101   Temp 98.5 F (36.9 C) (Oral)   Resp 20   Wt 83.9 kg   LMP 08/13/2020 (Approximate)   SpO2 95%   BMI 32.77 kg/m   Physical Exam  ED Results / Procedures / Treatments   Labs (all labs ordered are listed, but only abnormal results are displayed) Labs Reviewed  COMPREHENSIVE METABOLIC PANEL  CBC  URINALYSIS, ROUTINE W REFLEX MICROSCOPIC  PREGNANCY, URINE    EKG None  Radiology No results found.  Procedures Procedures   Medications Ordered in ED Medications - No data to display  ED Course  I have reviewed the triage vital signs and the nursing notes.  Pertinent labs & imaging results that were available during my care of the patient  were reviewed by me and considered in my medical decision making (see chart for details).    MDM Rules/Calculators/A&P                           Chronic weakness and imbalance 2/2 medulloblastoma follows with [facility]. Camp this past weekend, back Sunday reaching for pillow, fell out of bed and hit the back of her head on hard wood floor. No loc, no confusion, no dizziness. Has had similar event in bathtub with concussion and resulting trouble walking and recently finished physical rehab at [facility]. Reports she didn't notice concussion symptoms that time. Monday morning, awoke with nausea, decreased appetite, one episode of watery diarrhea, no blood noted in it. Low grade temperature. GI symptoms have mostly resolved, no more diarrhea, but eating less than normal d/t nausea. She is now having decreased balance and trouble walking that . Left side chronically weaker after treatment for brain tumor. Imbalance and trouble walking progressing. 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  CBC and UA unrevealing. Ct head unchanged, when tried to ambulate, new gait abnormality noted on exam. Discussed with neurology who recommended ED to ED transfer for MRI with contrast.   Final Clinical Impression(s) / ED Diagnoses Final diagnoses:  None    Rx / DC Orders ED Discharge Orders     None        Delene Ruffini, MD 09/03/20 [id number]    Tegeler, Gwenyth Allegra, MD 09/03/20 [phone]

## 2020-09-03 ENCOUNTER — Inpatient Hospital Stay (HOSPITAL_COMMUNITY): Payer: Medicaid Other

## 2020-09-03 ENCOUNTER — Encounter (HOSPITAL_COMMUNITY): Payer: Self-pay | Admitting: Internal Medicine

## 2020-09-03 ENCOUNTER — Emergency Department (HOSPITAL_COMMUNITY): Payer: Medicaid Other

## 2020-09-03 DIAGNOSIS — R2689 Other abnormalities of gait and mobility: Secondary | ICD-10-CM | POA: Diagnosis present

## 2020-09-03 DIAGNOSIS — Z8673 Personal history of transient ischemic attack (TIA), and cerebral infarction without residual deficits: Secondary | ICD-10-CM

## 2020-09-03 DIAGNOSIS — E063 Autoimmune thyroiditis: Secondary | ICD-10-CM | POA: Diagnosis present

## 2020-09-03 DIAGNOSIS — R29700 NIHSS score 0: Secondary | ICD-10-CM | POA: Diagnosis present

## 2020-09-03 DIAGNOSIS — E1043 Type 1 diabetes mellitus with diabetic autonomic (poly)neuropathy: Secondary | ICD-10-CM | POA: Diagnosis present

## 2020-09-03 DIAGNOSIS — Z9641 Presence of insulin pump (external) (internal): Secondary | ICD-10-CM | POA: Diagnosis present

## 2020-09-03 DIAGNOSIS — J4530 Mild persistent asthma, uncomplicated: Secondary | ICD-10-CM | POA: Diagnosis present

## 2020-09-03 DIAGNOSIS — I10 Essential (primary) hypertension: Secondary | ICD-10-CM | POA: Diagnosis present

## 2020-09-03 DIAGNOSIS — I639 Cerebral infarction, unspecified: Secondary | ICD-10-CM | POA: Diagnosis present

## 2020-09-03 DIAGNOSIS — K5900 Constipation, unspecified: Secondary | ICD-10-CM | POA: Diagnosis present

## 2020-09-03 DIAGNOSIS — Z20822 Contact with and (suspected) exposure to covid-19: Secondary | ICD-10-CM | POA: Diagnosis present

## 2020-09-03 DIAGNOSIS — E109 Type 1 diabetes mellitus without complications: Secondary | ICD-10-CM

## 2020-09-03 DIAGNOSIS — I6381 Other cerebral infarction due to occlusion or stenosis of small artery: Principal | ICD-10-CM

## 2020-09-03 DIAGNOSIS — N926 Irregular menstruation, unspecified: Secondary | ICD-10-CM | POA: Diagnosis present

## 2020-09-03 DIAGNOSIS — E1042 Type 1 diabetes mellitus with diabetic polyneuropathy: Secondary | ICD-10-CM | POA: Diagnosis present

## 2020-09-03 DIAGNOSIS — R296 Repeated falls: Secondary | ICD-10-CM | POA: Diagnosis present

## 2020-09-03 DIAGNOSIS — E1143 Type 2 diabetes mellitus with diabetic autonomic (poly)neuropathy: Secondary | ICD-10-CM | POA: Diagnosis not present

## 2020-09-03 DIAGNOSIS — R4781 Slurred speech: Secondary | ICD-10-CM | POA: Diagnosis present

## 2020-09-03 DIAGNOSIS — Z923 Personal history of irradiation: Secondary | ICD-10-CM | POA: Diagnosis not present

## 2020-09-03 DIAGNOSIS — I6389 Other cerebral infarction: Secondary | ICD-10-CM

## 2020-09-03 DIAGNOSIS — E669 Obesity, unspecified: Secondary | ICD-10-CM | POA: Diagnosis present

## 2020-09-03 DIAGNOSIS — Z85841 Personal history of malignant neoplasm of brain: Secondary | ICD-10-CM | POA: Diagnosis not present

## 2020-09-03 DIAGNOSIS — H5509 Other forms of nystagmus: Secondary | ICD-10-CM | POA: Diagnosis present

## 2020-09-03 DIAGNOSIS — K3184 Gastroparesis: Secondary | ICD-10-CM | POA: Diagnosis present

## 2020-09-03 DIAGNOSIS — E785 Hyperlipidemia, unspecified: Secondary | ICD-10-CM | POA: Diagnosis present

## 2020-09-03 DIAGNOSIS — Z7982 Long term (current) use of aspirin: Secondary | ICD-10-CM | POA: Diagnosis not present

## 2020-09-03 DIAGNOSIS — D649 Anemia, unspecified: Secondary | ICD-10-CM | POA: Diagnosis present

## 2020-09-03 DIAGNOSIS — G3184 Mild cognitive impairment, so stated: Secondary | ICD-10-CM | POA: Diagnosis present

## 2020-09-03 HISTORY — DX: Personal history of transient ischemic attack (TIA), and cerebral infarction without residual deficits: Z86.73

## 2020-09-03 LAB — ECHOCARDIOGRAM COMPLETE
Area-P 1/2: 5.02 cm2
Calc EF: 59.9 %
S' Lateral: 2.9 cm
Single Plane A2C EF: 67.1 %
Single Plane A4C EF: 54.4 %
Weight: 2960 oz

## 2020-09-03 LAB — CBC
HCT: 36.7 % (ref 36.0–46.0)
Hemoglobin: 11.9 g/dL — ABNORMAL LOW (ref 12.0–15.0)
MCH: 29.4 pg (ref 26.0–34.0)
MCHC: 32.4 g/dL (ref 30.0–36.0)
MCV: 90.6 fL (ref 80.0–100.0)
Platelets: 421 10*3/uL — ABNORMAL HIGH (ref 150–400)
RBC: 4.05 MIL/uL (ref 3.87–5.11)
RDW: 13.5 % (ref 11.5–15.5)
WBC: 10.7 10*3/uL — ABNORMAL HIGH (ref 4.0–10.5)
nRBC: 0 % (ref 0.0–0.2)

## 2020-09-03 LAB — CBG MONITORING, ED
Glucose-Capillary: 146 mg/dL — ABNORMAL HIGH (ref 70–99)
Glucose-Capillary: 213 mg/dL — ABNORMAL HIGH (ref 70–99)
Glucose-Capillary: 246 mg/dL — ABNORMAL HIGH (ref 70–99)

## 2020-09-03 LAB — HIV ANTIBODY (ROUTINE TESTING W REFLEX): HIV Screen 4th Generation wRfx: NONREACTIVE

## 2020-09-03 LAB — HEMOGLOBIN A1C
Hgb A1c MFr Bld: 8.5 % — ABNORMAL HIGH (ref 4.8–5.6)
Mean Plasma Glucose: 197.25 mg/dL

## 2020-09-03 LAB — LIPID PANEL
Cholesterol: 215 mg/dL — ABNORMAL HIGH (ref 0–200)
HDL: 40 mg/dL — ABNORMAL LOW (ref >40)
LDL Cholesterol: 135 mg/dL — ABNORMAL HIGH (ref 0–99)
Total CHOL/HDL Ratio: 5.4 RATIO
Triglycerides: 198 mg/dL — ABNORMAL HIGH (ref <150)
VLDL: 40 mg/dL (ref 0–40)

## 2020-09-03 LAB — CREATININE, SERUM
Creatinine, Ser: 1.04 mg/dL — ABNORMAL HIGH (ref 0.44–1.00)
GFR, Estimated: >60 mL/min (ref >60)

## 2020-09-03 LAB — GLUCOSE, CAPILLARY
Glucose-Capillary: 268 mg/dL — ABNORMAL HIGH (ref 70–99)
Glucose-Capillary: 280 mg/dL — ABNORMAL HIGH (ref 70–99)

## 2020-09-03 LAB — RESP PANEL BY RT-PCR (FLU A&B, COVID) ARPGX2
Influenza A by PCR: NEGATIVE
Influenza B by PCR: NEGATIVE
SARS Coronavirus 2 by RT PCR: NEGATIVE

## 2020-09-03 IMAGING — MR MR HEAD W/O CM
12 of 13 series · 44 of 48 positions shown · non-contrast
Comparison: None.

CLINICAL DATA: Neuro deficit, acute, stroke suspected. Recent viral
illness. Unsteady gait. History of posterior fossa brain tumor
removal.

EXAM:
MRI HEAD WITHOUT CONTRAST
TECHNIQUE: Multiplanar, multiecho pulse sequences of the brain and surrounding
structures were obtained without intravenous contrast.

[Series 5: DWI · axial · 3.0mm · 0.88mm/px · z∈[-167,-8]mm · 8 of 112 slices shown (1 of 4)]
[im 1/112]
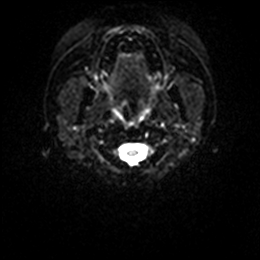
[im 16/112]
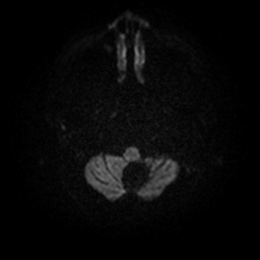
[im 32/112]
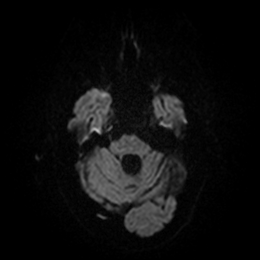
[im 48/112]
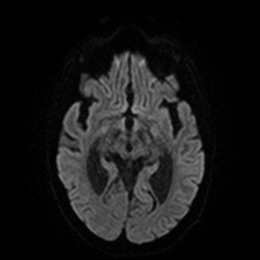
[im 64/112]
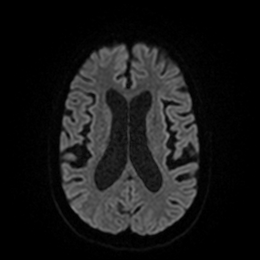
[im 80/112]
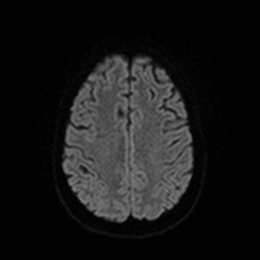
[im 96/112]
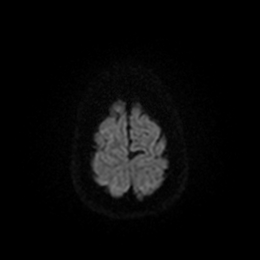
[im 112/112]
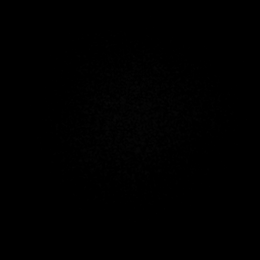

[Series 6: DWI · axial · 3.0mm · 0.88mm/px · z∈[-167,-11]mm · 4 of 55 slices shown (2 of 4)]
[im 1/55]
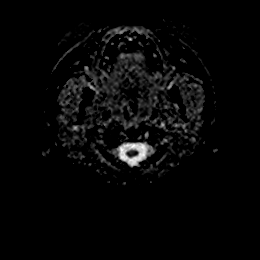
[im 19/55]
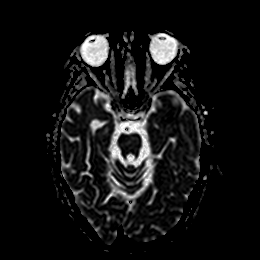
[im 37/55]
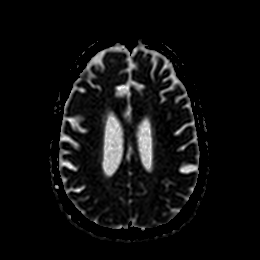
[im 55/55]
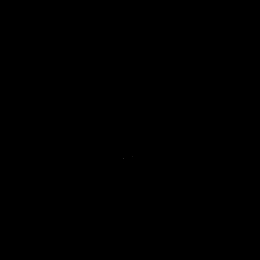

[Series 7: DWI · coronal · 4.0mm · 0.88mm/px · 5 of 74 slices shown (3 of 4)]
[im 1/74]
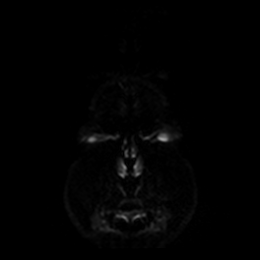
[im 19/74]
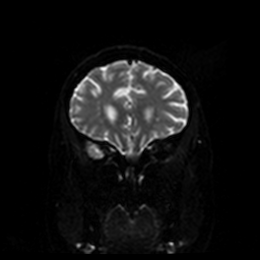
[im 37/74]
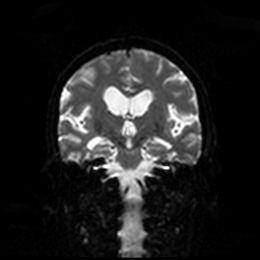
[im 55/74]
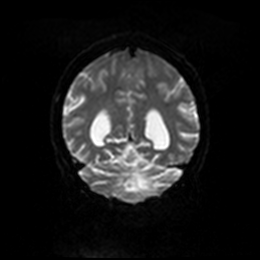
[im 74/74]
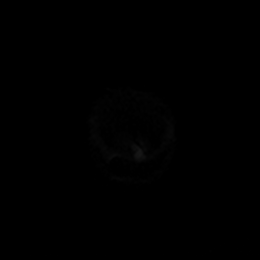

[Series 8: DWI · coronal · 4.0mm · 0.88mm/px · 3 of 37 slices shown (4 of 4)]
[im 1/37]
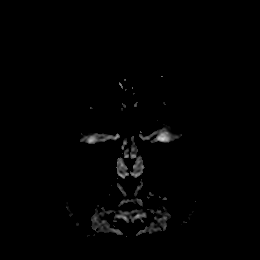
[im 19/37]
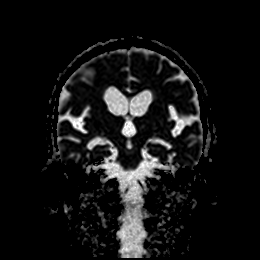
[im 37/37]
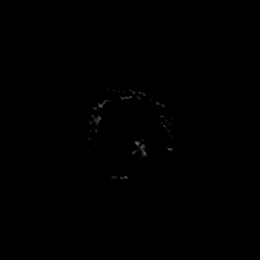

[Series 9: T1 · sagittal · 5.0mm · 0.75mm/px · 2 of 23 slices shown]
[im 1/23]
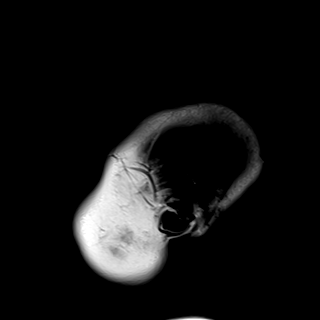
[im 23/23]
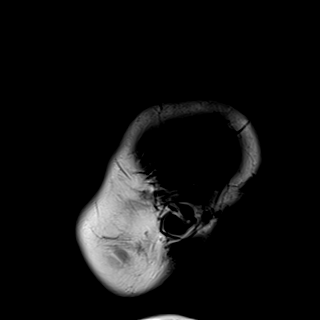

[Series 10: T2 · axial · 5.0mm · 0.72mm/px · z∈[-168,-7]mm · 2 of 29 slices shown (1 of 2)]
[im 1/29]
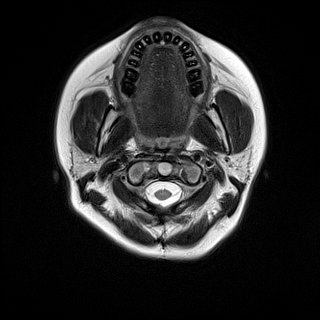
[im 29/29]
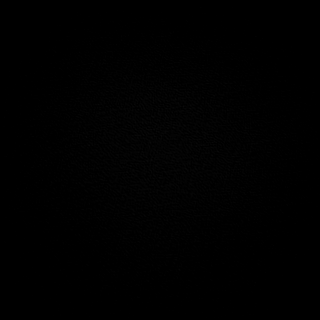

[Series 11: FLAIR · axial · 5.0mm · 0.45mm/px · z∈[-169,-7]mm · 2 of 29 slices shown]
[im 1/29]
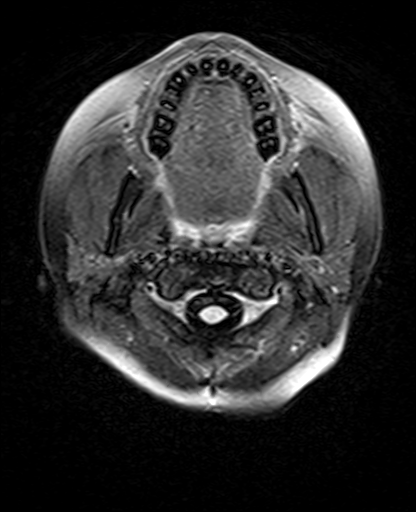
[im 29/29]
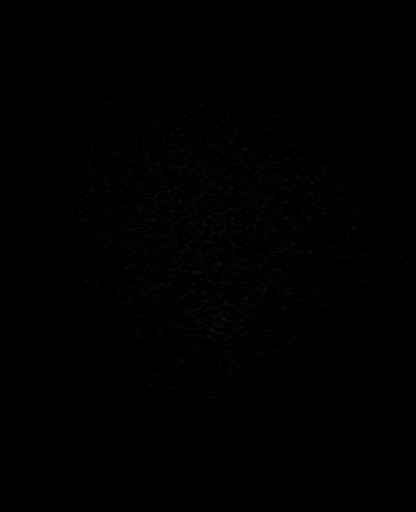

[Series 12: mag_images · axial · 3.0mm · 0.90mm/px · z∈[-167,-9]mm · 4 of 56 slices shown]
[im 1/56]
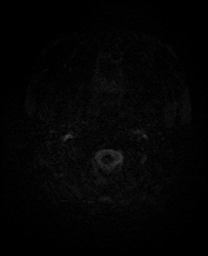
[im 19/56]
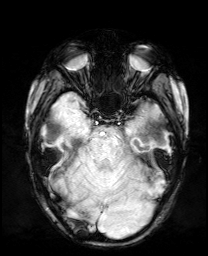
[im 37/56]
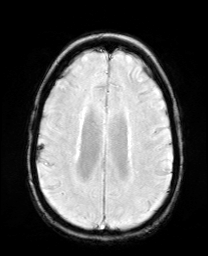
[im 56/56]
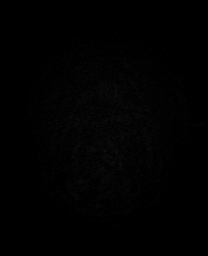

[Series 13: pha_images · axial · 3.0mm · 0.90mm/px · z∈[-167,-15]mm · 4 of 54 slices shown]
[im 1/54]
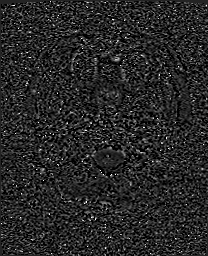
[im 18/54]
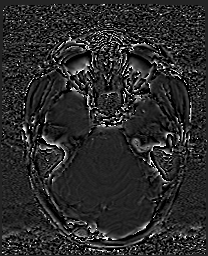
[im 36/54]
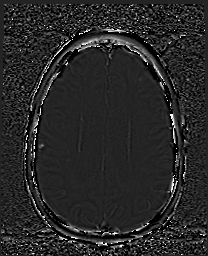
[im 54/54]
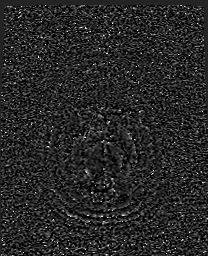

[Series 14: swi_images · axial · 3.0mm · 0.90mm/px · z∈[-167,-9]mm · 4 of 56 slices shown]
[im 1/56]
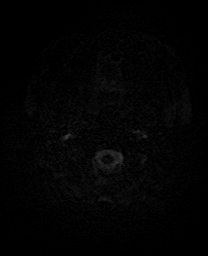
[im 19/56]
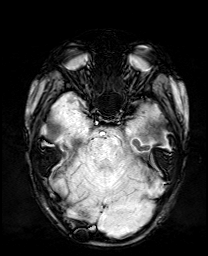
[im 37/56]
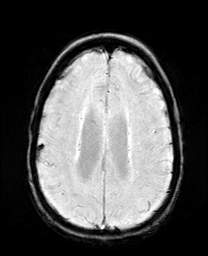
[im 56/56]
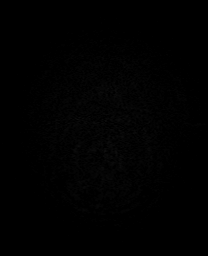

[Series 15: mip_images(sw) · axial · 24.0mm · 0.90mm/px · z∈[-157,-19]mm · 4 of 49 slices shown]
[im 1/49]
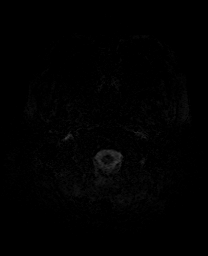
[im 17/49]
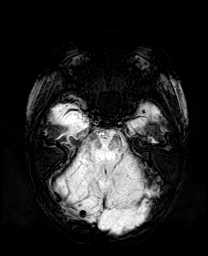
[im 33/49]
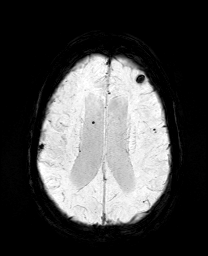
[im 49/49]
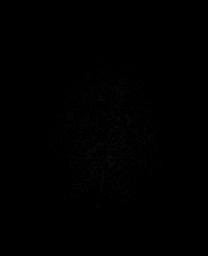

[Series 17: T2 · coronal · 5.0mm · 0.34mm/px · 2 of 31 slices shown (2 of 2)]
[im 1/31]
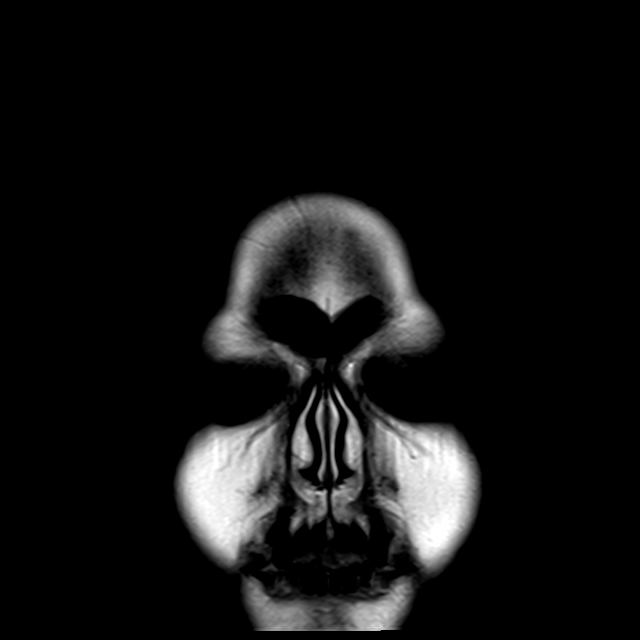
[im 31/31]
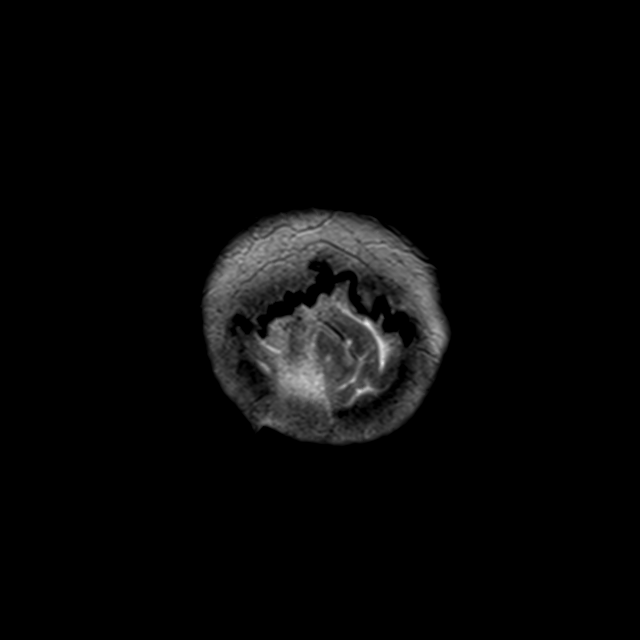

[44 of 48 positions shown; findings below may reference images not displayed]

FINDINGS: Brain: Punctate focus of abnormal diffusion restriction in the
cerebral peduncle. Postsurgical changes of posterior fossa tumor
resection with encephalomalacia of the left cerebellar hemisphere.
Chronic microhemorrhage in the right occipital and left frontal
lobes. Moderate ventriculomegaly. Mild hyperintense T2-weighted
signal at the occipital horns of both lateral ventricles. The
midline structures are normal.

Vascular: Major flow voids are preserved.

Skull and upper cervical spine: Normal calvarium and skull base.
Visualized upper cervical spine and soft tissues are normal.

Sinuses/Orbits:No paranasal sinus fluid levels or advanced mucosal
thickening. No mastoid or middle ear effusion. Normal orbits.
IMPRESSION: 1. Punctate focus of abnormal diffusion restriction in the left
cerebral peduncle may indicate a small acute infarct.
2. Postsurgical changes of posterior fossa tumor resection with
moderate ventriculomegaly

## 2020-09-03 IMAGING — MR MR MRA HEAD W/O CM
1 series · 20 of 48 positions shown · non-contrast
Comparison: Brain MRI from earlier today

CLINICAL DATA: Stroke workup

EXAM:
MRA HEAD WITHOUT CONTRAST
TECHNIQUE: Angiographic images of the Circle of Willis were acquired using MRA
technique without intravenous contrast.

[Series 5: 3d cow · axial · 0.5mm · 0.41mm/px · z∈[-149,-63]mm · 20 of 192 slices shown]
[im 1/192]
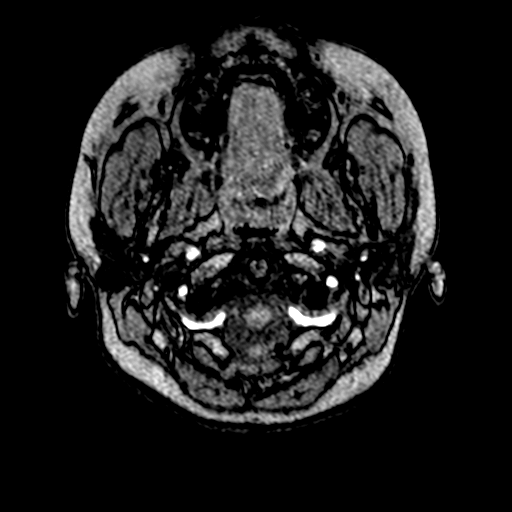
[im 5/192]
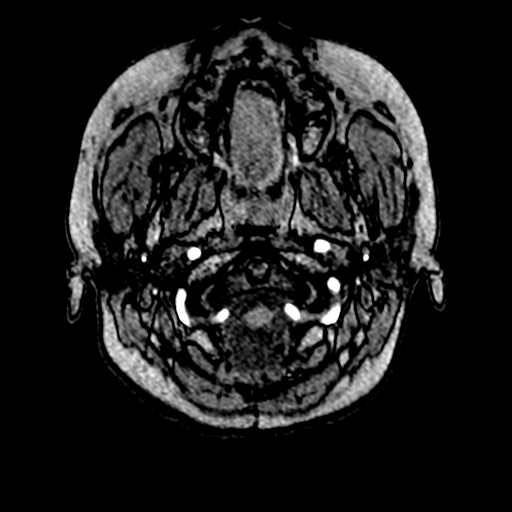
[im 9/192]
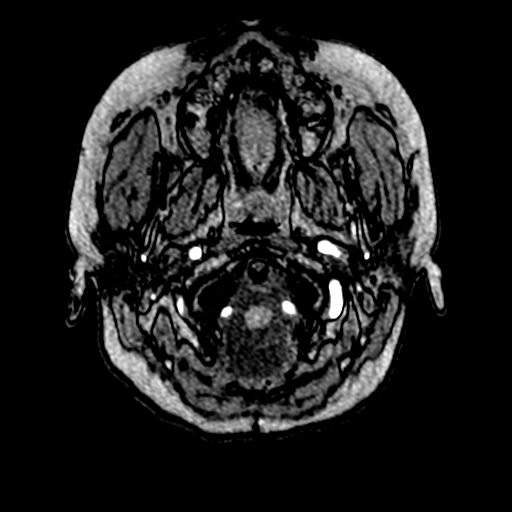
[im 13/192]
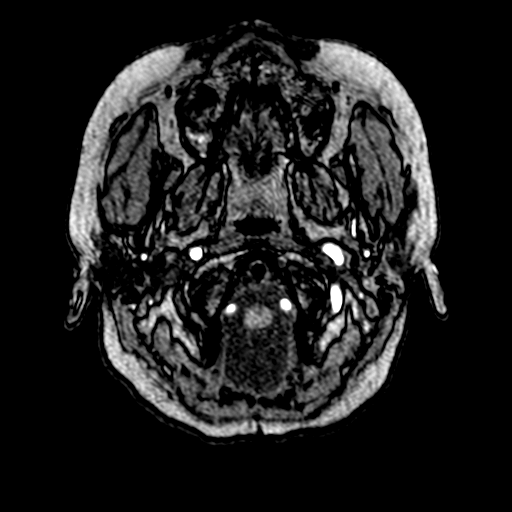
[im 17/192]
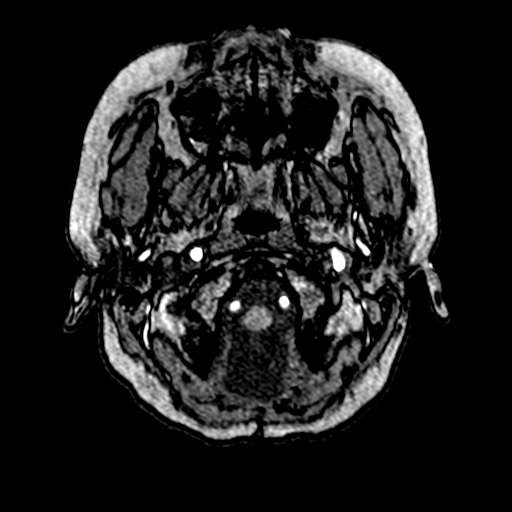
[im 21/192]
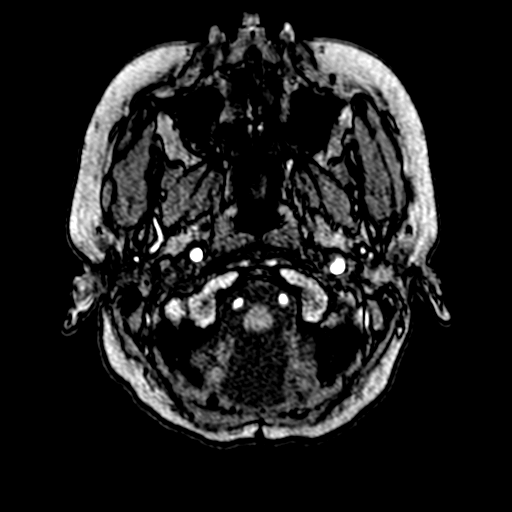
[im 25/192]
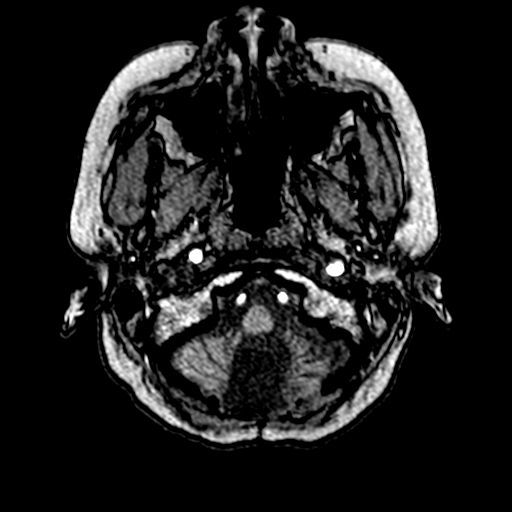
[im 29/192]
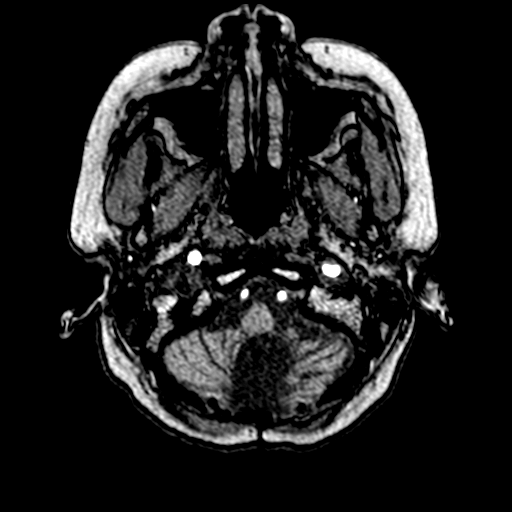
[im 33/192]
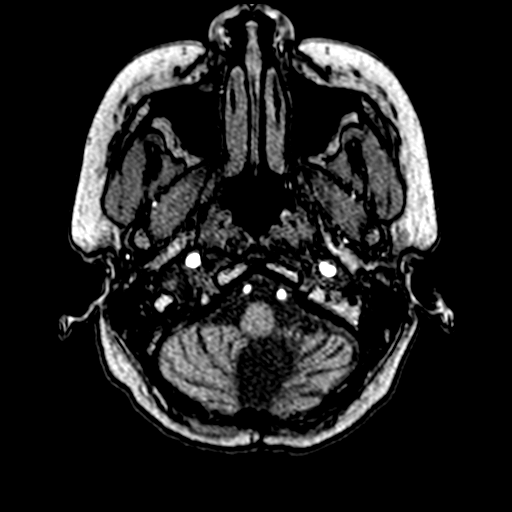
[im 37/192]
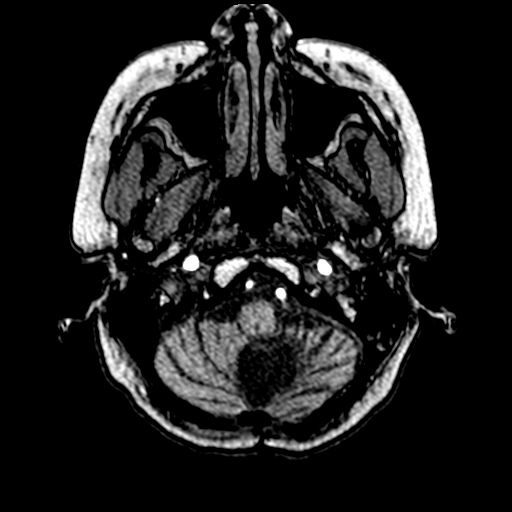
[im 41/192]
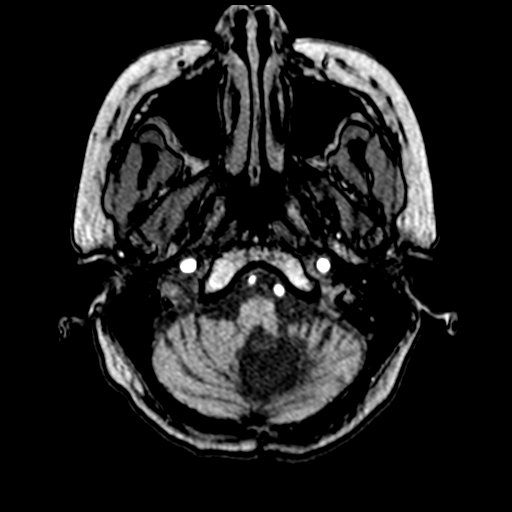
[im 45/192]
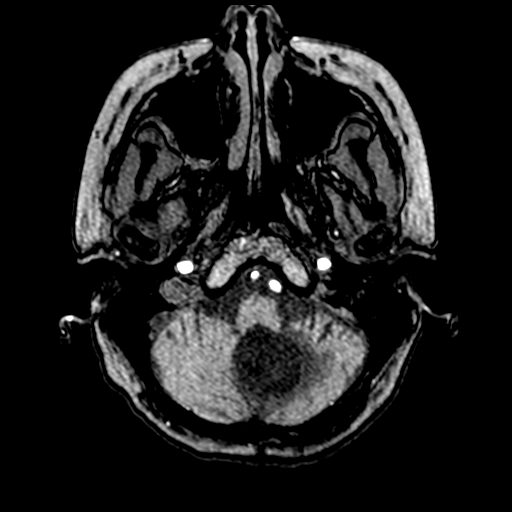
[im 61/192]
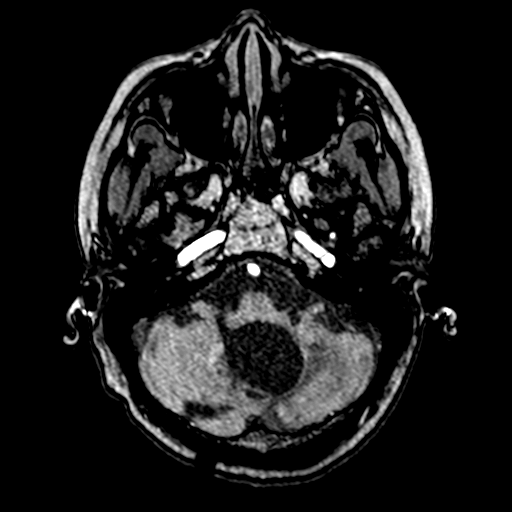
[im 86/192]
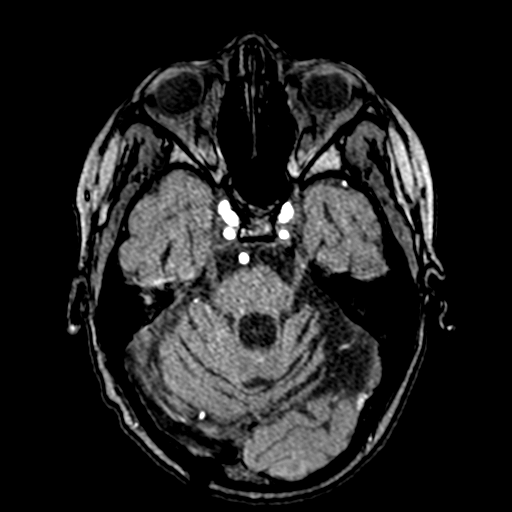
[im 98/192]
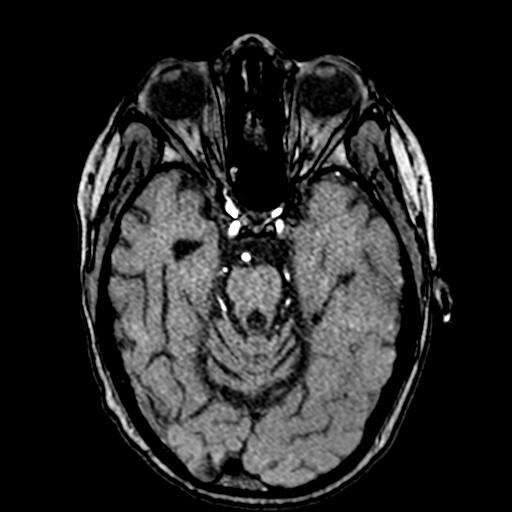
[im 110/192]
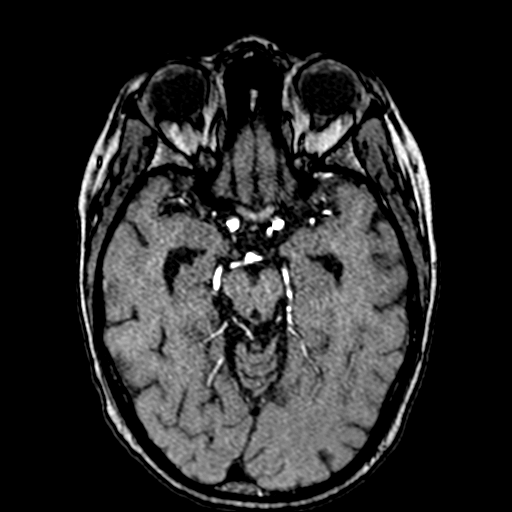
[im 135/192]
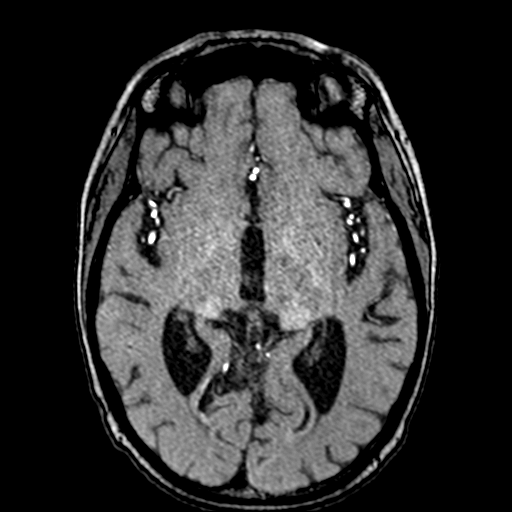
[im 159/192]
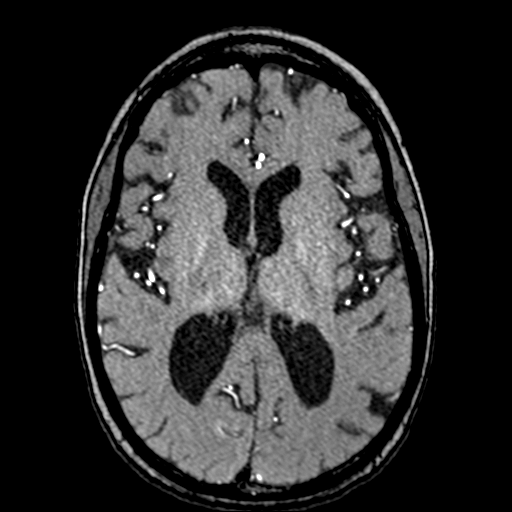
[im 163/192]
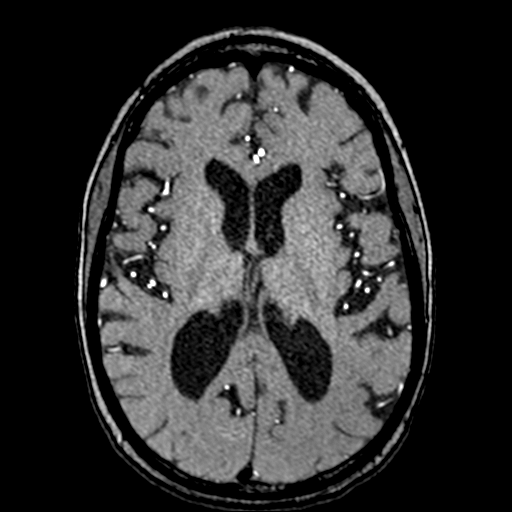
[im 183/192]
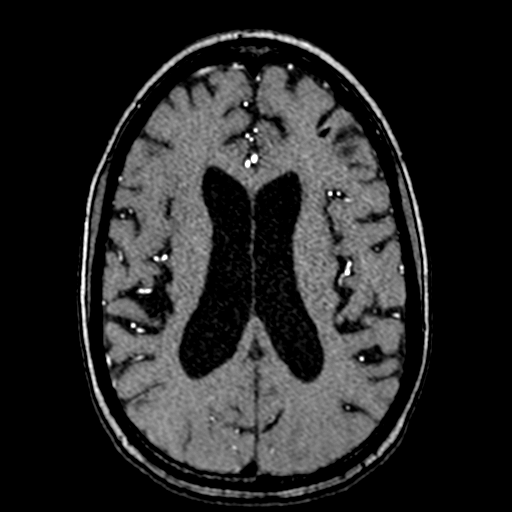

[20 of 48 positions shown; findings below may reference images not displayed]

FINDINGS: Anterior circulation: Major vessels are smoothly contoured. Left
paraclinoid ICA narrowing measuring at least 50%. MCA branch
narrowings which are mild-to-moderate. Favor premature
atherosclerosis given medical history. No aneurysm.

Posterior circulation: Vertebral and basilar arteries are smoothly
contoured and widely patent. Bilateral PCA branch narrowings,
including a moderate right P2 segment stenosis. No branch occlusion
or aneurysm.

Anatomic variants: None significant
IMPRESSION: Large and medium vessel narrowings, likely premature atherosclerosis
given medical history.

## 2020-09-03 MED ORDER — ASPIRIN EC 81 MG PO TBEC
81.0000 mg | DELAYED_RELEASE_TABLET | Freq: Every day | ORAL | Status: DC
  Administered 2020-09-03 – 2020-09-07 (×5): 81 mg via ORAL
  Filled 2020-09-03 (×5): qty 1

## 2020-09-03 MED ORDER — MONTELUKAST SODIUM 10 MG PO TABS
10.0000 mg | ORAL_TABLET | Freq: Every day | ORAL | Status: DC
  Administered 2020-09-03 – 2020-09-06 (×4): 10 mg via ORAL
  Filled 2020-09-03 (×5): qty 1

## 2020-09-03 MED ORDER — ACETAMINOPHEN 160 MG/5ML PO SOLN: 650.0000 mg | ORAL | Status: DC | PRN

## 2020-09-03 MED ORDER — ALBUTEROL SULFATE (2.5 MG/3ML) 0.083% IN NEBU: 2.5000 mg | INHALATION_SOLUTION | Freq: Four times a day (QID) | RESPIRATORY_TRACT | Status: DC | PRN

## 2020-09-03 MED ORDER — LORATADINE 10 MG PO TABS
10.0000 mg | ORAL_TABLET | Freq: Every day | ORAL | Status: DC
  Administered 2020-09-03 – 2020-09-07 (×5): 10 mg via ORAL
  Filled 2020-09-03 (×5): qty 1

## 2020-09-03 MED ORDER — ACETAMINOPHEN 325 MG PO TABS
650.0000 mg | ORAL_TABLET | ORAL | Status: DC | PRN
  Administered 2020-09-04 – 2020-09-06 (×3): 650 mg via ORAL
  Filled 2020-09-03 (×3): qty 2

## 2020-09-03 MED ORDER — VITAMIN B-12 1000 MCG PO TABS
1000.0000 ug | ORAL_TABLET | Freq: Every day | ORAL | Status: DC
  Administered 2020-09-03 – 2020-09-06 (×4): 1000 ug via ORAL
  Filled 2020-09-03 (×4): qty 1

## 2020-09-03 MED ORDER — CITALOPRAM HYDROBROMIDE 10 MG PO TABS
5.0000 mg | ORAL_TABLET | Freq: Every day | ORAL | Status: DC
  Administered 2020-09-03: 5 mg via ORAL
  Filled 2020-09-03: qty 1

## 2020-09-03 MED ORDER — ACETAMINOPHEN 650 MG RE SUPP: 650.0000 mg | RECTAL | Status: DC | PRN

## 2020-09-03 MED ORDER — CITALOPRAM HYDROBROMIDE 10 MG PO TABS
5.0000 mg | ORAL_TABLET | Freq: Every day | ORAL | Status: DC
  Administered 2020-09-04 – 2020-09-06 (×3): 5 mg via ORAL
  Filled 2020-09-03 (×3): qty 1

## 2020-09-03 MED ORDER — ROSUVASTATIN CALCIUM 5 MG PO TABS
5.0000 mg | ORAL_TABLET | Freq: Every day | ORAL | Status: DC
  Administered 2020-09-03 – 2020-09-07 (×5): 5 mg via ORAL
  Filled 2020-09-03 (×5): qty 1

## 2020-09-03 MED ORDER — ENOXAPARIN SODIUM 40 MG/0.4ML IJ SOSY
40.0000 mg | PREFILLED_SYRINGE | INTRAMUSCULAR | Status: DC
  Administered 2020-09-03 – 2020-09-07 (×5): 40 mg via SUBCUTANEOUS
  Filled 2020-09-03 (×5): qty 0.4

## 2020-09-03 MED ORDER — ONDANSETRON HCL 4 MG PO TABS: 4.0000 mg | ORAL_TABLET | Freq: Three times a day (TID) | ORAL | Status: DC | PRN

## 2020-09-03 MED ORDER — SODIUM CHLORIDE 0.9 % IV SOLN: INTRAVENOUS | Status: AC

## 2020-09-03 MED ORDER — MAGNESIUM OXIDE -MG SUPPLEMENT 400 (240 MG) MG PO TABS
800.0000 mg | ORAL_TABLET | Freq: Every day | ORAL | Status: DC
  Administered 2020-09-03 – 2020-09-06 (×4): 800 mg via ORAL
  Filled 2020-09-03 (×4): qty 2

## 2020-09-03 MED ORDER — MECLIZINE HCL 12.5 MG PO TABS: 25.0000 mg | ORAL_TABLET | Freq: Two times a day (BID) | ORAL | Status: DC | PRN

## 2020-09-03 MED ORDER — LUBIPROSTONE 8 MCG PO CAPS
16.0000 ug | ORAL_CAPSULE | Freq: Every day | ORAL | Status: DC
  Administered 2020-09-03 – 2020-09-07 (×5): 16 ug via ORAL
  Filled 2020-09-03 (×5): qty 2

## 2020-09-03 MED ORDER — LEVOTHYROXINE SODIUM 100 MCG PO TABS
100.0000 ug | ORAL_TABLET | Freq: Every day | ORAL | Status: DC
  Administered 2020-09-03 – 2020-09-07 (×5): 100 ug via ORAL
  Filled 2020-09-03 (×6): qty 1

## 2020-09-03 MED ORDER — POTASSIUM CHLORIDE CRYS ER 10 MEQ PO TBCR
10.0000 meq | EXTENDED_RELEASE_TABLET | Freq: Every day | ORAL | Status: DC
  Administered 2020-09-03 – 2020-09-07 (×5): 10 meq via ORAL
  Filled 2020-09-03 (×5): qty 1

## 2020-09-03 MED ORDER — INSULIN PUMP
Freq: Three times a day (TID) | SUBCUTANEOUS | Status: DC
  Administered 2020-09-06 (×2): 5 via SUBCUTANEOUS
  Administered 2020-09-06: 4.5 via SUBCUTANEOUS
  Filled 2020-09-03: qty 1

## 2020-09-03 MED ORDER — SACCHAROMYCES BOULARDII 250 MG PO CAPS
250.0000 mg | ORAL_CAPSULE | Freq: Every day | ORAL | Status: DC
  Administered 2020-09-03 – 2020-09-07 (×5): 250 mg via ORAL
  Filled 2020-09-03 (×5): qty 1

## 2020-09-03 MED ORDER — PERFLUTREN LIPID MICROSPHERE
1.0000 mL | INTRAVENOUS | Status: AC | PRN
  Administered 2020-09-03: 2 mL via INTRAVENOUS
  Filled 2020-09-03: qty 10

## 2020-09-03 MED ORDER — STROKE: EARLY STAGES OF RECOVERY BOOK
Freq: Once | Status: DC
  Filled 2020-09-03: qty 1

## 2020-09-03 MED ORDER — GABAPENTIN 300 MG PO CAPS
300.0000 mg | ORAL_CAPSULE | Freq: Three times a day (TID) | ORAL | Status: DC
  Administered 2020-09-03 – 2020-09-07 (×12): 300 mg via ORAL
  Filled 2020-09-03 (×13): qty 1

## 2020-09-03 NOTE — Progress Notes (Signed)
Patient seen and examined.  Admitted early morning hours by nighttime hospitalist.  35 year old female with extensive medical issues including type 1 diabetes on insulin pump, baseline mobility difficulties and hard of hearing presented with loss of balance and possible slurred speech as witnessed by mom.  She also had frequent falls last week.  She was found to have left cerebral hemisphere stroke, no obvious neurological deficits.  Seen and examined with mother at the bedside.  Also with neurology team at the bedside. Patient had intolerance to atorvastatin, I added Crestor 5 mg daily. Further management as per neurology.

## 2020-09-03 NOTE — Progress Notes (Signed)
VASCULAR LAB    Carotid duplex has been performed.  See CV proc for preliminary results.   Cheria Sadiq, RVT 09/03/2020, 4:25 PM

## 2020-09-03 NOTE — ED Notes (Signed)
Patient transported to MRI 

## 2020-09-03 NOTE — ED Notes (Addendum)
Neurologist at bedside. 

## 2020-09-03 NOTE — ED Provider Notes (Signed)
Patient to [facility] ED as transfer from [place] for MRI r/o stroke. 35 yo h/o T1DM, medulloblastoma s/p resection, previous CVA with 3 day history of gait instability above baseline, nausea, no fever. Has had some viral symptoms, diarrhea.   MRI resulted and shows findings concerning for acute stroke. This was discussed with Dr. Lorrin Goodell, neuro, who agrees with dx acute stroke. She gets the bulk of her care at [facility] and would prefer admission there.   [facility] consulted. No available beds. Dr. Lorrin Goodell to provide neuro consultation here with admission to medicine. Discussed with Dr. Hal Hope, [facility], who accepts the patient to their service.   Patient and mother updated on plan and are comfortable with admission to [facility].    Charlann Lange, PA-C 09/03/20 [id number]    Ezequiel Essex, MD 09/03/20 [phone]

## 2020-09-03 NOTE — ED Notes (Signed)
PT at bedside.

## 2020-09-03 NOTE — ED Notes (Signed)
Pt transported to MRI 

## 2020-09-03 NOTE — ED Notes (Signed)
Pt to bedside commode

## 2020-09-03 NOTE — ED Notes (Signed)
Back to room at this time.

## 2020-09-03 NOTE — ED Notes (Signed)
Coming from [facility] via [facility] - pt here for MRI.

## 2020-09-03 NOTE — ED Notes (Signed)
Report given to nurse on 3W Melissa

## 2020-09-03 NOTE — Evaluation (Signed)
Physical Therapy Evaluation Patient Details Name: Tracey Perkins MRN: [id number] DOB: 09/11/1985 Today's Date: 09/03/2020   History of Present Illness  Pt is a 35 y/o female admitted 8/17 secondary to unsteadiness. 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 PMH includes type 1 DM, asthma, head injury with resulting nystagmus, brain tumor s/p surgery.  Clinical Impression  Pt admitted secondary to problem above with deficits below. Pt requiring min to mod A to stand, perform transfers, and take side steps at EOB. Mild weakness noted in RLE >LLE. Per pt's mother, balance looks worse compared to baseline. Will need to further progress mobility to determine most appropriate d/c recommendations. If pt progresses well, will likely be able to d/c home with HHPT. However, if pt remains very unsteady, would likely benefit from CIR. Will continue to follow acutely and update recommendations based on pt progression.     Follow Up Recommendations Supervision for mobility/OOB (TBD pending progression; likely CIR vs HHPT)    Equipment Recommendations  Other (comment) (TBD pending progression)    Recommendations for Other Services       Precautions / Restrictions Precautions Precautions: Fall Precaution Comments: Pt's mom reports she had a fall on 8/14 prior to admission Restrictions Weight Bearing Restrictions: No      Mobility  Bed Mobility Overal bed mobility: Needs Assistance Bed Mobility: Supine to Sit;Sit to Supine     Supine to sit: Min assist Sit to supine: Supervision   General bed mobility comments: Min A for trunk assist to come to sitting. Increased time required.    Transfers Overall transfer level: Needs assistance Equipment used: 1 person hand held assist Transfers: Sit to/from [facility] Sit to Stand: Min assist Stand pivot transfers: Min assist;Mod assist       General transfer comment: Pt requiring min A for steadying to stand. When taking side  steps at EOB, especially to the R, noted increased unsteadiness and required min to mod A for steadying. Transferred to [facility] at end of session with min to mod A for steadying.  Ambulation/Gait                Stairs            Wheelchair Mobility    Modified Rankin (Stroke Patients Only) Modified Rankin (Stroke Patients Only) Pre-Morbid Rankin Score: No significant disability Modified Rankin: Moderately severe disability     Balance Overall balance assessment: Needs assistance Sitting-balance support: No upper extremity supported;Feet supported Sitting balance-Leahy Scale: Fair     Standing balance support: Single extremity supported Standing balance-Leahy Scale: Poor Standing balance comment: Reliant on UE and external support                             Pertinent Vitals/Pain Pain Assessment: No/denies pain    Home Living Family/patient expects to be discharged to:: Private residence Living Arrangements: Parent Available Help at Discharge: Family;Available 24 hours/day Type of Home: House Home Access: Stairs to enter Entrance Stairs-Rails: Psychiatric nurse of Steps: 5 Home Layout: Two level Home Equipment: Clinical cytogeneticist - 4 wheels      Prior Function Level of Independence: Independent with assistive device(s)         Comments: Uses rollator for longer distances     Hand Dominance        Extremity/Trunk Assessment   Upper Extremity Assessment Upper Extremity Assessment: Defer to OT evaluation    Lower Extremity Assessment Lower Extremity Assessment:  RLE deficits/detail RLE Deficits / Details: Noted mild weakness as compared to the LLE. Grossly at 4/5 throughout.    Cervical / Trunk Assessment Cervical / Trunk Assessment: Normal  Communication   Communication: No difficulties  Cognition Arousal/Alertness: Awake/alert;Lethargic Behavior During Therapy: WFL for tasks assessed/performed Overall Cognitive  Status: Impaired/Different from baseline Area of Impairment: Problem solving                             Problem Solving: Slow processing General Comments: Pt initially very sleepy as she reports she did not sleep well the night before. Slow processing noted.      General Comments General comments (skin integrity, edema, etc.): Pt's mother present during session.    Exercises     Assessment/Plan    PT Assessment Patient needs continued PT services  PT Problem List Decreased strength;Decreased activity tolerance;Decreased balance;Decreased mobility;Decreased cognition;Decreased knowledge of use of DME;Decreased safety awareness;Decreased knowledge of precautions       PT Treatment Interventions DME instruction;Gait training;Stair training;Functional mobility training;Therapeutic activities;Balance training;Therapeutic exercise;Patient/family education    PT Goals (Current goals can be found in the Care Plan section)  Acute Rehab PT Goals Patient Stated Goal: to go home PT Goal Formulation: With patient/family Time For Goal Achievement: 09/17/20 Potential to Achieve Goals: Good    Frequency Min 4X/week   Barriers to discharge        Co-evaluation               AM-PAC PT "6 Clicks" Mobility  Outcome Measure Help needed turning from your back to your side while in a flat bed without using bedrails?: A Little Help needed moving from lying on your back to sitting on the side of a flat bed without using bedrails?: A Little Help needed moving to and from a bed to a chair (including a wheelchair)?: A Lot Help needed standing up from a chair using your arms (e.g., wheelchair or bedside chair)?: A Little Help needed to walk in hospital room?: A Lot Help needed climbing 3-5 steps with a railing? : Total 6 Click Score: 14    End of Session Equipment Utilized During Treatment: Gait belt Activity Tolerance: Patient tolerated treatment well Patient left: with call  bell/phone within reach;with family/visitor present (on [facility] with call bell; NT aware) Nurse Communication: Mobility status PT Visit Diagnosis: Unsteadiness on feet (R26.81);Muscle weakness (generalized) (M62.81);History of falling (Z91.81)    Time: [id number] PT Time Calculation (min) (ACUTE ONLY): 20 min   Charges:   PT Evaluation $PT Eval Moderate Complexity: 1 Mod          Reuel Derby, PT, DPT  Acute Rehabilitation Services  Pager: [phone] Office: [phone]   Rudean Hitt 09/03/2020, 11:28 AM

## 2020-09-03 NOTE — Consult Note (Signed)
NEUROLOGY CONSULTATION NOTE   Date of service: September 03, 2020 Patient Name: Tracey Perkins MRN:  [id number] DOB:  1985/04/22 Reason for consult: "Punctate Stroke in left cerebral peduncle" Requesting Provider: Rise Patience, MD _ _ _   _ __   _ __ _ _  __ __   _ __   __ _  History of Present Illness  Tracey Perkins is a 35 y.o. female with PMH significant for asthma, Diabetes Type 1, hypothyroidism,  hx of medulloblastoma s/p surgical resection x 2 and XRT + chemo with vincristine, hx of concussion last year and extensive rehab who fell out of bed and hit the back of her head on Sunday and had gait abnormality afterwards. Monday, felt feverish with nausea and watery diarrhea. Was doing good on Tuesday and was back to her baseline. Wednesday after lunch noted trouble walking and decreased balance. Came in to the ED. She had a CT Head w/o contrast which was negative. Given new gait abnormality, cae was discussed with me and recommended MRI Brain which demonstrated a small acute stroke in the left cerebral peduncle.  She has long standing hx of type 1 Diabetes with her HbA1c around 9 per patient. Has had 2 prior strokes. Does not smoke, no Alcohol, no recreational substances.   ROS   Constitutional Denies weight loss, fever and chills.   HEENT Denies changes in vision and hearing.   Respiratory Denies SOB and cough.   CV Denies palpitations and CP   GI Denies abdominal pain, nausea, vomiting and diarrhea.   GU Denies dysuria and urinary frequency.   MSK Denies myalgia and joint pain.   Skin Denies rash and pruritus.   Neurological Denies headache and syncope.   Psychiatric Denies recent changes in mood. Denies anxiety and depression.    Past History   Past Medical History:  Diagnosis Date   Asthma    Brain tumor ([place])    Diabetes mellitus without complication ([place])    Eczema    Food allergy    Gastroparesis    Hypokalemia    Hypomagnesemia    Hypothyroid    Neuropathy     Post concussion syndrome 05/2019   Thyroid disease    Vision changes    Past Surgical History:  Procedure Laterality Date   BRAIN SURGERY     Portacath placement and removed     WISDOM TOOTH EXTRACTION     Family History  Problem Relation Age of Onset   Thyroid disease Mother    Eczema Sister    Thyroid disease Sister    Thyroid disease Sister    Social History   Socioeconomic History   Marital status: Single    Spouse name: Not on file   Number of children: Not on file   Years of education: Not on file   Highest education level: Not on file  Occupational History   Not on file  Tobacco Use   Smoking status: Never   Smokeless tobacco: Never  Vaping Use   Vaping Use: Never used  Substance and Sexual Activity   Alcohol use: No   Drug use: No   Sexual activity: Yes    Birth control/protection: Pill  Other Topics Concern   Not on file  Social History Narrative   Not on file   Social Determinants of Health   Financial Resource Strain: Not on file  Food Insecurity: Not on file  Transportation Needs: Not on file  Physical Activity: Not on  file  Stress: Not on file  Social Connections: Not on file   Allergies  Allergen Reactions   Banana Other (See Comments)    Mouth burning and swollen   Bactrim [Sulfamethoxazole-Trimethoprim] Hives   Doxycycline Hives   Gentian Violet Other (See Comments)    "It burns."  Per mother   Gentian Violet-Proflavine Sulfate [Triple Dye] Other (See Comments)    Mouth burn   Mold Extract [Trichophyton] Other (See Comments)    Hay Fever   Morphine And Related Hives   Other Other (See Comments) and Hives    "Hay fever, dust and pollen."  Per patient.   Vancomycin Other (See Comments)    Red man syndrome     Medications  (Not in a hospital admission)    Vitals   Vitals:   09/02/20 2302 09/03/20 0004 09/03/20 0047 09/03/20 0216  BP: 126/82 122/80 120/80 125/85  Pulse: 84 82 84 81  Resp: '15 16 16 16  '$ Temp:  98.4 F (36.9  C) (!) 97 F (36.1 C) (!) 97.5 F (36.4 C)  TempSrc:  Oral Oral Oral  SpO2: 96% 97% 98% 99%  Weight:         Body mass index is 32.77 kg/m.  Physical Exam   General: Laying comfortably in bed; in no acute distress.  HENT: Normal oropharynx and mucosa. Normal external appearance of ears and nose.  Neck: Supple, no pain or tenderness  CV: No JVD. No peripheral edema.  Pulmonary: Symmetric Chest rise. Normal respiratory effort.  Abdomen: Soft to touch, non-tender.  Ext: No cyanosis, edema, or deformity  Skin: No rash. Normal palpation of skin.   Musculoskeletal: Normal digits and nails by inspection. No clubbing.   Neurologic Examination  Mental status/Cognition: Asleep, opens eyes to voice and keeps them open for the rest of the exam, oriented to self, place, month and year, good attention. Speech/language: Fluent, comprehension intact, object naming intact, repetition intact. Cranial nerves:   CN II Pupils equal and reactive to light, no VF deficits   CN III,IV,VI EOM intact, no gaze preference or deviation, + nystagmus   CN V normal sensation in V1, V2, and V3 segments bilaterally   CN VII no asymmetry, no nasolabial fold flattening   CN VIII normal hearing to speech   CN IX & X normal palatal elevation, no uvular deviation   CN XI 5/5 head turn and 5/5 shoulder shrug bilaterally   CN XII midline tongue protrusion   Motor:  Muscle bulk: normal, tone normal, pronator drift mild RUE drift. tremor none Mvmt Root Nerve  Muscle Right Left Comments  SA C5/6 Ax Deltoid     EF C5/6 Mc Biceps 5 5   EE C6/7/8 Rad Triceps 5 5   WF C6/7 Med FCR     WE C7/8 PIN ECU     F Ab C8/T1 U ADM/FDI 4+ 5   HF L1/2/3 Fem Illopsoas 5 5   KE L2/3/4 Fem Quad 5 5   DF L4/5 D Peron Tib Ant 5 5   PF S1/2 Tibial Grc/Sol 5 5    Reflexes:  Right Left Comments  Pectoralis      Biceps (C5/6) 2 2   Brachioradialis (C5/6) 2 2    Triceps (C6/7) 2 2    Patellar (L3/4) 2 2    Achilles (S1)       Hoffman      Plantar     Jaw jerk    Sensation:  Light touch intact  Pin prick    Temperature    Vibration   Proprioception    Coordination/Complex Motor:  - Finger to Nose intact BL - Heel to shin intact BL - Rapid alternating movement are slowed in RUE. - Gait: deferred.  Labs   CBC:  Recent Labs  Lab 09/02/20 1840  WBC 10.4  HGB 11.8*  HCT 35.1*  MCV 89.3  PLT 432*    Basic Metabolic Panel:  Lab Results  Component Value Date   NA 136 09/02/2020   K 3.7 09/02/2020   CO2 29 09/02/2020   GLUCOSE 220 (H) 09/02/2020   BUN 11 09/02/2020   CREATININE 1.04 (H) 09/02/2020   CALCIUM 9.0 09/02/2020   GFRNONAA >60 09/02/2020   GFRAA >60 08/15/2019   Lipid Panel: No results found for: LDLCALC HgbA1c: No results found for: HGBA1C Urine Drug Screen:     Component Value Date/Time   LABOPIA NONE DETECTED 08/15/2019 1518   [place] DETECTED 08/15/2019 1518   LABBENZ NONE DETECTED 08/15/2019 1518   AMPHETMU NONE DETECTED 08/15/2019 [place] DETECTED 08/15/2019 1518   LABBARB NONE DETECTED 08/15/2019 1518    Alcohol Level     Component Value Date/Time   ETH <10 08/15/2019 1228    CT Head without contrast: Personally reviewed and CTH was negative for a large hypodensity concerning for a large territory infarct or hyperdensity concerning for an ICH  MR Angio head without contrast and Carotid Duplex BL: pending  MRI Brain: Small acute L cerebral peduncle stroke.   Impression   REILLY MANGIERI is a 35 y.o. female with PMH significant for asthma, Diabetes Type 1, hypothyroidism,  hx of medulloblastoma s/p surgical resection x 2 and XRT + chemo with vincristine, hx of concussion last year and extensive rehab who presents with gait abnormality and found to have a punctate L cerebral peduncle stroke. Her neurologic examination is notable for mild RUE hand grip weakness but she was also just waking up. No other focal deficit noted.  The infarct  appears to be a small vessl stroke, likely from long standing diabetes with elevated HbA1c to 91 per most recent labs in care-everywhere.  Primary Diagnosis:  Other cerebral infarction due to occlusion of stenosis of small artery.  Secondary Diagnosis: Obesity, Type 1 diabetes.  Recommendations  Plan:  - Frequent Neuro checks per stroke unit protocol - Recommend Vascular imaging with MRA Angio Head without contrast and US Carotid doppler - Recommend obtaining TTE - Recommend obtaining Lipid panel with LDL - Please start statin if LDL > 70 - Recommend HbA1c - Antithrombotic - aspirin '81mg'$  daily for now. - Recommend DVT ppx - SBP goal - permissive hypertension first 24 h < 220/110. Held home meds.  - Recommend Telemetry monitoring for arrythmia - Recommend bedside swallow screen prior to PO intake. - Stroke education booklet - Recommend PT/OT/SLP consult   Plan discussed with Dr. Hal Hope and with patient and her mother at bedside. ______________________________________________________________________   Thank you for the opportunity to take part in the care of this patient. If you have any further questions, please contact the neurology consultation attending.  Signed,  [place] Pager Number [phone] _ _ _   _ __   _ __ _ _  __ __   _ __   __ _

## 2020-09-03 NOTE — H&P (Signed)
History and Physical    Tracey Perkins [id number] DOB: 03-23-1985 DOA: 09/02/2020  PCP: Prince Solian, MD  Patient coming from: Home.  Chief Complaint: Gait difficulties.  HPI: Tracey Perkins is a 35 y.o. female with history of diabetes mellitus type 1 on insulin pump, chronic lymphocytic thyroiditis, asthma, history of medulloblastoma status postsurgery and radiation in the 90s was having some gait difficulties over the last 24 hours.  Patient had viral syndrome-like symptoms 3 days ago which resolved in 24 hours.  Then this gait difficulty was noticed by patient's mother and was brought to the ER.  Did not have any visual symptoms or difficulty speaking or swallowing.  ED Course: In the ER patient had a CT head followed by MRI of the brain which shows acute infarct in the left cerebral peduncle.  Neurologist on-call was consulted.  On exam patient is able to move all extremities and no facial asymmetry.  Patient passed swallow.  COVID test was negative.  EKG shows normal sinus rhythm.  Patient admitted for further work-up for stroke.  Labs show blood glucose of 220 creatinine 1.04 hemoglobin 11.8.  Review of Systems: As per HPI, rest all negative.   Past Medical History:  Diagnosis Date   Asthma    Brain tumor ([place])    Diabetes mellitus without complication ([place])    Eczema    Food allergy    Gastroparesis    Hypokalemia    Hypomagnesemia    Hypothyroid    Neuropathy    Post concussion syndrome 05/2019   Thyroid disease    Vision changes     Past Surgical History:  Procedure Laterality Date   BRAIN SURGERY     Portacath placement and removed     WISDOM TOOTH EXTRACTION       reports that she has never smoked. She has never used smokeless tobacco. She reports that she does not drink alcohol and does not use drugs.  Allergies  Allergen Reactions   Banana Other (See Comments)    Mouth burning and swollen   Bactrim [Sulfamethoxazole-Trimethoprim] Hives    Doxycycline Hives   Gentian Violet Other (See Comments)    "It burns."  Per mother   Gentian Violet-Proflavine Sulfate [Triple Dye] Other (See Comments)    Mouth burn   Mold Extract [Trichophyton] Other (See Comments)    Hay Fever   Morphine And Related Hives   Other Other (See Comments) and Hives    "Hay fever, dust and pollen."  Per patient.   Vancomycin Other (See Comments)    Red man syndrome     Family History  Problem Relation Age of Onset   Thyroid disease Mother    Eczema Sister    Thyroid disease Sister    Thyroid disease Sister     Prior to Admission medications   Medication Sig Start Date End Date Taking? Authorizing Provider  acetaminophen (TYLENOL) 500 MG tablet Take 500 mg by mouth every 6 (six) hours as needed for moderate pain.    Yes [provider]  aspirin EC 81 MG tablet Take 81 mg by mouth daily. Swallow whole.   Yes [provider]  azelastine (ASTELIN) 0.1 % nasal spray Place 2 sprays into both nostrils 2 (two) times daily. Patient taking differently: Place 2 sprays into both nostrils 2 (two) times daily as needed for rhinitis or allergies. 12/25/19  Yes Ambs, Kathrine Cords, FNP  cetirizine (ZYRTEC) 10 MG tablet TAKE 1 TABLET BY MOUTH EVERY DAY  Patient taking differently: Take 10 mg by mouth daily. 08/21/20  Yes Ambs, Kathrine Cords, FNP  citalopram (CELEXA) 10 MG tablet Take 0.5 tablets (5 mg total) by mouth daily. Patient will cut pill in half. She has a traumatic brain injury and will take half the dose. 07/08/20  Yes Eulis Canner E, NP  Continuous Blood Gluc Transmit (DEXCOM G6 TRANSMITTER) MISC USE 1 EACH EVERY 3 (THREE) MONTHS 06/24/19  Yes [provider]  cyanocobalamin 1000 MCG tablet Take 1,000 mcg by mouth at bedtime. 12/24/19 12/23/20 Yes [provider]  DERMOTIC 0.01 % OIL Place 4 drops into both ears See admin instructions. Twice a week 05/15/20  Yes [provider]  EPINEPHrine 0.3 mg/0.3 mL IJ SOAJ injection Use as  directed for severe allergic reaction. 07/13/20  Yes Ambs, Kathrine Cords, FNP  fluticasone (FLONASE) 50 MCG/ACT nasal spray 2 sprays per nostril daily as needed for stuffy nose. Patient taking differently: Place 1 spray into both nostrils daily as needed for allergies or rhinitis. 12/25/19  Yes Ambs, Kathrine Cords, FNP  fluticasone (FLOVENT HFA) 110 MCG/ACT inhaler Inhale 2 puffs into the lungs daily. Rinse, gargle and spit out after use. Patient taking differently: Inhale 2 puffs into the lungs daily as needed (wheezing/shortness of breath). Rinse, gargle and spit out after use. 12/25/19  Yes Ambs, Kathrine Cords, FNP  gabapentin (NEURONTIN) 300 MG capsule Take 300 mg by mouth 3 (three) times daily. 01/25/17  Yes [provider]  glucagon 1 MG injection Inject 1 mg into the skin once as needed (for diabeties). 06/30/16  Yes [provider]  Glucagon 3 MG/DOSE POWD Place 3 mg into the nose as needed (low blood sugar).   Yes [provider]  insulin aspart (NOVOLOG) 100 UNIT/ML injection USE UP TO 130 UNITS DAILY, AS DIRECTED IN INSULIN PUMP. 12/24/19  Yes [provider]  Insulin Human (INSULIN PUMP) SOLN Inject 80 each into the skin daily. Novolog Insulin Pump   Yes [provider]  KLOR-CON M10 10 MEQ tablet Take 10 mEq by mouth daily. 03/18/19  Yes [provider]  levothyroxine (SYNTHROID) 100 MCG tablet Take 100 mcg by mouth daily before breakfast.    Yes [provider]  lubiprostone (AMITIZA) 8 MCG capsule Take 16 mcg by mouth daily with breakfast.    Yes [provider]  magnesium oxide (MAG-OX) 400 MG tablet Take 800 mg by mouth at bedtime.   Yes [provider]  meclizine (ANTIVERT) 25 MG tablet Take 25 mg by mouth 2 (two) times daily as needed for dizziness. 06/19/19  Yes [provider]  montelukast (SINGULAIR) 10 MG tablet TAKE 1 TABLET (10 MG TOTAL) BY MOUTH AT BEDTIME. TO PREVENT COUGHING OR WHEEZING 08/21/20  Yes Ambs, Kathrine Cords, FNP   norgestimate-ethinyl estradiol (ORTHO-CYCLEN) 0.25-35 MG-MCG tablet Take 1 tablet by mouth daily.    Yes [provider]  omeprazole (PRILOSEC) 20 MG capsule Take 20 mg by mouth daily as needed (heartburn). 03/13/20  Yes [provider]  ondansetron (ZOFRAN) 4 MG tablet Take 1 tablet (4 mg total) by mouth every 8 (eight) hours as needed for nausea or vomiting. 06/03/20  Yes Domenic Moras, PA-C  Polyethyl Glycol-Propyl Glycol (SYSTANE OP) Place 1 drop into both eyes daily as needed.   Yes [provider]  PROAIR HFA 108 (90 Base) MCG/ACT inhaler TAKE 2 PUFFS BY MOUTH EVERY 4 HOURS AS NEEDED Patient taking differently: Inhale 2 puffs into the lungs every 6 (six) hours  as needed for wheezing or shortness of breath. 04/01/19  Yes Ambs, Kathrine Cords, FNP  Saccharomyces boulardii (FLORASTOR PO) Take 1 capsule by mouth daily.   Yes [provider]  insulin glargine (LANTUS) 100 UNIT/ML injection Inject 16 Units into the skin daily.  Patient not taking: No sig reported 11/19/15   [provider]  metoCLOPramide (REGLAN) 10 MG tablet Take 1 tablet (10 mg total) by mouth every 6 (six) hours. Patient not taking: No sig reported 05/27/20   Joanne Gavel, PA-C    Physical Exam: Constitutional: Moderately built and nourished. Vitals:   09/02/20 2302 09/03/20 0004 09/03/20 0047 09/03/20 0216  BP: 126/82 122/80 120/80 125/85  Pulse: 84 82 84 81  Resp: '15 16 16 16  '$ Temp:  98.4 F (36.9 C) (!) 97 F (36.1 C) (!) 97.5 F (36.4 C)  TempSrc:  Oral Oral Oral  SpO2: 96% 97% 98% 99%  Weight:       Eyes: Anicteric no pallor. ENMT: No discharge from the ears eyes nose and mouth. Neck: No mass felt.  No neck rigidity. Respiratory: No rhonchi or crepitations. Cardiovascular: S1-S2 heard. Abdomen: Soft nontender bowel sound present. Musculoskeletal: No edema. Skin: No rash. Neurologic: Alert awake oriented to her name and place moving all extremities 5 x 5 no  dysdiadochokinesia no facial asymmetry tongue is midline pupils are equal and reactive to light. Psychiatric: Oriented to name and place.   Labs on Admission: I have personally reviewed following labs and imaging studies  CBC: Recent Labs  Lab 09/02/20 1840  WBC 10.4  HGB 11.8*  HCT 35.1*  MCV 89.3  PLT 123456*   Basic Metabolic Panel: Recent Labs  Lab 09/02/20 1840  NA 136  K 3.7  CL 99  CO2 29  GLUCOSE 220*  BUN 11  CREATININE 1.04*  CALCIUM 9.0   GFR: CrCl cannot be calculated (Unknown ideal weight.). Liver Function Tests: Recent Labs  Lab 09/02/20 1840  AST 13*  ALT 14  ALKPHOS 78  BILITOT 0.1*  PROT 6.9  ALBUMIN 3.4*   No results for input(s): LIPASE, AMYLASE in the last 168 hours. No results for input(s): AMMONIA in the last 168 hours. Coagulation Profile: No results for input(s): INR, PROTIME in the last 168 hours. Cardiac Enzymes: No results for input(s): CKTOTAL, CKMB, CKMBINDEX, TROPONINI in the last 168 hours. BNP (last 3 results) No results for input(s): PROBNP in the last 8760 hours. HbA1C: No results for input(s): HGBA1C in the last 72 hours. CBG: Recent Labs  Lab 09/03/20 0138  GLUCAP 146*   Lipid Profile: No results for input(s): CHOL, HDL, LDLCALC, TRIG, CHOLHDL, LDLDIRECT in the last 72 hours. Thyroid Function Tests: No results for input(s): TSH, T4TOTAL, FREET4, T3FREE, THYROIDAB in the last 72 hours. Anemia Panel: No results for input(s): VITAMINB12, FOLATE, FERRITIN, TIBC, IRON, RETICCTPCT in the last 72 hours. Urine analysis:    Component Value Date/Time   COLORURINE YELLOW 09/02/2020 2104   APPEARANCEUR CLEAR 09/02/2020 2104   LABSPEC 1.010 09/02/2020 2104   PHURINE 6.0 09/02/2020 2104   GLUCOSEU 250 (A) 09/02/2020 2104   HGBUR LARGE (A) 09/02/2020 2104   BILIRUBINUR NEGATIVE 09/02/2020 2104   KETONESUR NEGATIVE 09/02/2020 2104   PROTEINUR NEGATIVE 09/02/2020 2104   UROBILINOGEN 0.2 07/14/2013 0746   NITRITE NEGATIVE  09/02/2020 2104   LEUKOCYTESUR NEGATIVE 09/02/2020 2104   Sepsis Labs: '@LABRCNTIP'$ (procalcitonin:4,lacticidven:4) ) Recent Results (from the past 240 hour(s))  Resp Panel by RT-PCR (Flu A&B, Covid) Nasopharyngeal Swab  Status: None   Collection Time: 09/02/20 11:07 PM   Specimen: Nasopharyngeal Swab; Nasopharyngeal(NP) swabs in vial transport medium  Result Value Ref Range Status   SARS Coronavirus 2 by RT PCR NEGATIVE NEGATIVE Final    Comment: (NOTE) SARS-CoV-2 target nucleic acids are NOT DETECTED.  The SARS-CoV-2 RNA is generally detectable in upper respiratory specimens during the acute phase of infection. The lowest concentration of SARS-CoV-2 viral copies this assay can detect is 138 copies/mL. A negative result does not preclude SARS-Cov-2 infection and should not be used as the sole basis for treatment or other patient management decisions. A negative result may occur with  improper specimen collection/handling, submission of specimen other than nasopharyngeal swab, presence of viral mutation(s) within the areas targeted by this assay, and inadequate number of viral copies(<138 copies/mL). A negative result must be combined with clinical observations, patient history, and epidemiological information. The expected result is Negative.  Fact Sheet for Patients:  EntrepreneurPulse.com.au  Fact Sheet for Healthcare Providers:  IncredibleEmployment.be  This test is no t yet approved or cleared by the [place] FDA and  has been authorized for detection and/or diagnosis of SARS-CoV-2 by FDA under an Emergency Use Authorization (EUA). This EUA will remain  in effect (meaning this test can be used) for the duration of the COVID-19 declaration under Section 564(b)(1) of the Act, 21 U.S.C.section 360bbb-3(b)(1), unless the authorization is terminated  or revoked sooner.       Influenza A by PCR NEGATIVE NEGATIVE Final   Influenza B  by PCR NEGATIVE NEGATIVE Final    Comment: (NOTE) The Xpert Xpress SARS-CoV-2/FLU/RSV plus assay is intended as an aid in the diagnosis of influenza from Nasopharyngeal swab specimens and should not be used as a sole basis for treatment. Nasal washings and aspirates are unacceptable for Xpert Xpress SARS-CoV-2/FLU/RSV testing.  Fact Sheet for Patients: EntrepreneurPulse.com.au  Fact Sheet for Healthcare Providers: IncredibleEmployment.be  This test is not yet approved or cleared by the [place] FDA and has been authorized for detection and/or diagnosis of SARS-CoV-2 by FDA under an Emergency Use Authorization (EUA). This EUA will remain in effect (meaning this test can be used) for the duration of the COVID-19 declaration under Section 564(b)(1) of the Act, 21 U.S.C. section 360bbb-3(b)(1), unless the authorization is terminated or revoked.  Performed at [facility], [address] [place], [name] [address]      Radiological Exams on Admission: CT HEAD WO CONTRAST (5MM)  Result Date: 09/02/2020 CLINICAL DATA:  History of medulloblastoma with prior resection and recent head trauma, initial encounter EXAM: CT HEAD WITHOUT CONTRAST TECHNIQUE: Contiguous axial images were obtained from the base of the skull through the vertex without intravenous contrast. COMPARISON:  By report from 05/31/2002 FINDINGS: Brain: Postsurgical changes are noted in the posterior fossa with a cystic component identified in the midline similar to that described on the prior exam. The overall appearance is stable. Some calcifications are noted within the right cerebellar hemisphere likely related to the prior surgery. Large posterior occipital defect is noted. No acute hemorrhage or infarct is noted. Vascular: No hyperdense vessel or unexpected calcification. Skull: Posterior occipital defect is noted. No acute abnormality seen. Sinuses/Orbits: No acute finding.  Other: None. IMPRESSION: Postsurgical changes in the posterior fossa consistent with the given clinical history. No acute abnormality is noted at this time. Electronically Signed   By: Inez Catalina M.D.   On: 09/02/2020 20:45   MR BRAIN WO CONTRAST  Result Date:  09/03/2020 CLINICAL DATA:  Neuro deficit, acute, stroke suspected. Recent viral illness. Unsteady gait. History of posterior fossa brain tumor removal. EXAM: MRI HEAD WITHOUT CONTRAST TECHNIQUE: Multiplanar, multiecho pulse sequences of the brain and surrounding structures were obtained without intravenous contrast. COMPARISON:  None. FINDINGS: Brain: Punctate focus of abnormal diffusion restriction in the cerebral peduncle. Postsurgical changes of posterior fossa tumor resection with encephalomalacia of the left cerebellar hemisphere. Chronic microhemorrhage in the right occipital and left frontal lobes. Moderate ventriculomegaly. Mild hyperintense T2-weighted signal at the occipital horns of both lateral ventricles. The midline structures are normal. Vascular: Major flow voids are preserved. Skull and upper cervical spine: Normal calvarium and skull base. Visualized upper cervical spine and soft tissues are normal. Sinuses/Orbits:No paranasal sinus fluid levels or advanced mucosal thickening. No mastoid or middle ear effusion. Normal orbits. IMPRESSION: 1. Punctate focus of abnormal diffusion restriction in the left cerebral peduncle may indicate a small acute infarct. 2. Postsurgical changes of posterior fossa tumor resection with moderate ventriculomegaly Electronically Signed   By: Ulyses Jarred M.D.   On: 09/03/2020 01:48   DG Chest Portable 1 View  Result Date: 09/02/2020 CLINICAL DATA:  Cough and upper respiratory symptoms.  Weakness. EXAM: PORTABLE CHEST 1 VIEW COMPARISON:  None. FINDINGS: Lung volumes are low.The cardiomediastinal contours are normal. Pulmonary vasculature is normal for technique. No consolidation, pleural effusion, or  pneumothorax. No acute osseous abnormalities are seen. IMPRESSION: Low lung volumes without acute findings. Electronically Signed   By: Keith Rake M.D.   On: 09/02/2020 23:21    EKG: Independently reviewed.  Normal sinus rhythm.  Assessment/Plan Principal Problem:   Acute CVA (cerebrovascular accident) ([place]) Active Problems:   Mild persistent asthma without complication   Type 1 diabetes mellitus without complication ([facility])   Chronic lymphocytic thyroiditis    Acute CVA -discussed with Dr. Alferd Patee on-call neurologist.  At this time plan is to continue patient's aspirin get MRA of the brain, carotid Doppler and 2D echo.  Patient did pass swallow evaluation.  We will keep patient on neurochecks.  Check hemoglobin A1c lipid panel.  Further recommendations per neurologist. Diabetes mellitus type 1 on insulin pump. History of chronic lymphocytic thyroiditis on Synthroid. History of mild asthma on as needed inhalers and montelukast. History of medulloblastoma status post surgery and radiation in the 90s. Anemia follow CBC. History of B12 deficiency on B12 supplements. History of gastroparesis and Amitiza. History of neuropathy and gait disturbance on gabapentin.  Since patient has acute CVA will need further work-up and inpatient status.   DVT prophylaxis: Lovenox. Code Status: Full code. Family Communication: Patient's mother. Disposition Plan: Home. Consults called: Neurology and physical therapy. Admission status: Inpatient.   Rise Patience MD [facility] Pager [phone].  If 7PM-7AM, please contact night-coverage [email or web address] Password [facility]  09/03/2020, 5:14 AM

## 2020-09-03 NOTE — Progress Notes (Addendum)
STROKE TEAM PROGRESS NOTE    Interval History   No acute events overnight, patients mother is at the bedside.   She states that in the past she has had minor stroke. Currently she is unsure if her gait instability has resolved as she has been resting in bed.   She and her mother were informed about enrolling in the sleep smart stroke study - she is interested in participating and wants to learn more about the study from the research coordinator.   Pertinent Lab Work and Imaging    09/02/20 T Head WO IV Contrast Postsurgical changes in the posterior fossa consistent with the given clinical history. No acute abnormality is noted at this time.  09/03/20 MRA Head  Large and medium vessel narrowings, likely premature atherosclerosis given medical history.  09/02/20 MRI Brain WO IV Contrast 1. Punctate focus of abnormal diffusion restriction in the left cerebral peduncle may indicate a small acute infarct. 2. Postsurgical changes of posterior fossa tumor resection with moderate ventriculomegaly  Echocardiogram Complete  Left ventricular ejection fraction of 55 to 60%.  No cardiac source of embolism.  Bilateral CUS  Mild bilateral thickening but not significant stenosis  Physical Examination   Constitutional: Calm, appropriate for condition  Cardiovascular: Normal RR Respiratory: No increased WOB   Mental status: AAOx4, following commands Speech: Fluent repetition and naming intact  Cranial nerves: EOMI however she has horizontal nystagmus with L > R , VFF, Face symmetric, Tongue midline  Motor: Normal bulk and tone. No drift. Antigravity throughout  Sensory: Intact to light touch throughout  Coordination: FNF + HTS intact Gait: Deferred   NIHSS: 0   Assessment and Plan   [name] is a 35 y.o. female w/pmh of asthma, Diabetes Type 1, hypothyroidism,  hx of medulloblastoma s/p surgical resection x 2 and XRT + chemo with vincristine, hx of concussion last year and  extensive rehab who presents with fall and gait abnormality.   #Left cerebral peduncle small lacunar Stroke in the setting of small vessel disease .  Prior silent bilateral subcortical lacunar strokes on imaging history of medulloblastoma surgery in 1999 with residual mild gait ataxia Patient presented with the symptoms described above. At this time, stroke work up is essentially complete aside from echocardiogram. MRA Head was pertinent for premature atherosclerosis. CUS ordered and pending. Stroke labs were completed including Lipid panel w/LDL 135 and Hemoglobin A1C 8.5.  Her MRI shows a potential stroke to the left cerebellar peduncle; suspect that this is a lacunar stroke in the setting of small vessel disease.  -DAPT for 21 days followed by Plavix monotherapy  -Continue Crestor 5 mg for stroke prevention ( Crestor chosen given statin intolerance)  -At discharge please place ambulatory referral to neurology for stroke follow up   #Permissive Hypertension  She does not have a history of hypertension and blood pressures are trending normotensive. Recommend permissive hypertension 48 hours post stroke and from there, gradually reduce the blood pressure, avoiding any acute drops. Long term blood pressure goal is < 140/90.  #Stroke Dysphagia Screening  Passed bedside swallow evaluation for a regular heart healthy diet   #Hyperlipidemia From a stroke prevention stand point, the LDL goal is < 70. LDL is 135, Crestor initiated for LDL control this admission. Recommend to continue at discharge.   #DMI Hemoglobin A1C this admission noted to be 8.5, not at goal from a stroke standpoint. Goal < 7. Recommend management with SSI this admission and to follow up outpatient with  PCP at discharge.    Hospital day # 0  Ruta Hinds, NP  [facility] Nurse Practitioner Patient seen and discussed with attending physician Dr. Carilyn Goodpasture MD NOTE  I have personally obtained history,examined  this patient, reviewed notes, independently viewed imaging studies, participated in medical decision making and plan of care.ROS completed by me personally and pertinent positives fully documented  I have made any additions or clarifications directly to the above note. Agree with note above.  Patient presented with sudden onset of worsening gait ataxia and MRI scan shows tiny punctate left cerebral peduncle lacunar infarct likely from small vessel disease.  MRI also shows what appears to be previously silent small bilateral thalamic lacunar infarct.  Continue ongoing stroke work-up.  Recommend aspirin and Plavix for 3 weeks followed by Plavix alone.  Patient also appears to be at risk for sleep apnea and is interested in considering participation in the sleep smart study.  She will be given information to review and decide.  Greater than 50% time during this 35-minute visit was spent in counseling and coordination of care and discussion with care team and answering questions.  Antony Contras, MD Medical Director [place] Pager: [phone] 09/03/2020 5:34 PM  To contact Stroke Continuity provider, please refer to [email or web address] After hours, contact General Neurology

## 2020-09-03 NOTE — ED Notes (Signed)
Dr. Hal Hope and Neurologist provider at bedside.

## 2020-09-03 NOTE — ED Notes (Signed)
MRI aware of pt. In for transport.

## 2020-09-03 NOTE — ED Notes (Signed)
Notified MRI at [facility] of pending patient's arrival to [facility] for MRI

## 2020-09-04 DIAGNOSIS — K3184 Gastroparesis: Secondary | ICD-10-CM

## 2020-09-04 DIAGNOSIS — G3184 Mild cognitive impairment, so stated: Secondary | ICD-10-CM

## 2020-09-04 DIAGNOSIS — E1143 Type 2 diabetes mellitus with diabetic autonomic (poly)neuropathy: Secondary | ICD-10-CM

## 2020-09-04 HISTORY — DX: Mild cognitive impairment of uncertain or unknown etiology: G31.84

## 2020-09-04 LAB — GLUCOSE, CAPILLARY
Glucose-Capillary: 205 mg/dL — ABNORMAL HIGH (ref 70–99)
Glucose-Capillary: 214 mg/dL — ABNORMAL HIGH (ref 70–99)
Glucose-Capillary: 202 mg/dL — ABNORMAL HIGH (ref 70–99)
Glucose-Capillary: 219 mg/dL — ABNORMAL HIGH (ref 70–99)
Glucose-Capillary: 220 mg/dL — ABNORMAL HIGH (ref 70–99)
Glucose-Capillary: 208 mg/dL — ABNORMAL HIGH (ref 70–99)

## 2020-09-04 MED ORDER — CLOPIDOGREL BISULFATE 75 MG PO TABS
75.0000 mg | ORAL_TABLET | Freq: Every day | ORAL | Status: DC
  Administered 2020-09-04 – 2020-09-07 (×4): 75 mg via ORAL
  Filled 2020-09-04 (×4): qty 1

## 2020-09-04 MED ORDER — METOCLOPRAMIDE HCL 10 MG PO TABS
5.0000 mg | ORAL_TABLET | Freq: Once | ORAL | Status: AC
  Administered 2020-09-04: 5 mg via ORAL
  Filled 2020-09-04: qty 1

## 2020-09-04 MED ORDER — ROSUVASTATIN CALCIUM 5 MG PO TABS: 5.0000 mg | ORAL_TABLET | Freq: Every day | ORAL | 1 refills | Status: DC

## 2020-09-04 MED ORDER — METOCLOPRAMIDE HCL 10 MG PO TABS
5.0000 mg | ORAL_TABLET | Freq: Three times a day (TID) | ORAL | Status: DC
  Administered 2020-09-04 – 2020-09-07 (×12): 5 mg via ORAL
  Filled 2020-09-04 (×12): qty 1

## 2020-09-04 NOTE — Evaluation (Signed)
Speech Language Pathology Evaluation Patient Details Name: Tracey Perkins MRN: [id number] DOB: Mar 09, 1985 Today's Date: 09/04/2020 Time: 1010-1045 SLP Time Calculation (min) (ACUTE ONLY): 35 min  Problem List:  Patient Active Problem List   Diagnosis Date Noted   Acute CVA (cerebrovascular accident) ([place]) 09/03/2020   Impulsiveness 10/14/2019   Mild episode of recurrent major depressive disorder ([place]) 09/04/2019   Anxiety 08/16/2019   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   Status post radiation therapy 11/22/2018   Seasonal and perennial allergic rhinitis 11/22/2018   Seasonal allergic conjunctivitis 11/22/2018   Acute sinusitis 11/22/2018   Keratosis pilaris 10/05/2017   Melanocytic nevi of trunk 10/05/2017   Adverse food reaction 02/09/2017   Gastroesophageal reflux disease 02/09/2017   Mild intermittent asthma with acute exacerbation 11/19/2015   Mild persistent asthma without complication [id number]   Acute seasonal allergic rhinitis due to pollen 11/16/2015   Anaphylactic shock due to adverse food reaction 11/16/2015   Medulloblastoma ([place]) 11/16/2015   Type 1 diabetes mellitus without complication ([place]) [id number]   Dry mouth 11/16/2015   Xerosis cutis 11/16/2015   Nystagmus 08/15/2014   Visual field defect 08/15/2014   Disequilibrium 07/25/2014   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   Past Medical History:  Past Medical History:  Diagnosis Date   Asthma    Brain tumor ([place])    Diabetes mellitus without complication ([employer])    Eczema    Food allergy    Gastroparesis    Hypokalemia    Hypomagnesemia    Hypothyroid    Neuropathy    Post concussion syndrome 05/2019   Thyroid disease    Vision changes    Past Surgical History:  Past Surgical History:  Procedure Laterality Date   BRAIN SURGERY     Portacath placement and removed     WISDOM TOOTH EXTRACTION     HPI:  35yo female admitted 09/02/20 due  to gait difficulties. PMH:  asthma, eczema, food allergy, polyneuropathy, uncontrolled type 1 diabetes, gastroparesis, h/o chronic L thalamic infarct, new R thalamic infarct since 2016, lymphocytic thyroiditis, s/p radiation therapy, chemo and 2 surgical resection for medulloblastoma, head injury with residual nystagmus, visual field defect, bilateral sensorineural hearing loss. MRI left cerebral peduncle infarct   Assessment / Plan / Recommendation Clinical Impression  The [place] Mental Status (SLUMS) was administerered. Pt scored 22/30, indicating mild neurocognitive deficits. Pt exhibited difficulty with attention, recall, and executive functions. She has a baseline of mild cognitive difficulties, and pt and her mother indicate her current status is not far from her baseline. Referral to outpatient speech therapy is recommended to maximize function and independence if pt does not go to [facility] for inpatient rehab. No further acute ST needs at this time.    SLP Assessment  SLP Recommendation/Assessment: All further Speech Language Pathology  needs can be addressed in the next venue of care  SLP Visit Diagnosis: Attention and concentration deficit Attention and concentration deficit following: Cerebral infarction    Follow Up Recommendations  Inpatient Rehab;Outpatient SLP       SLP Evaluation Cognition  Overall Cognitive Status: History of cognitive impairments - at baseline Arousal/Alertness: Awake/alert Orientation Level: Oriented X4 Attention: Focused;Sustained;Selective Focused Attention: Appears intact Sustained Attention: Impaired Sustained Attention Impairment: Verbal basic Selective Attention: Impaired Selective Attention Impairment: Verbal basic Memory: Impaired Memory Impairment: Retrieval deficit;Decreased short term memory Decreased Short Term Memory: Verbal basic Executive Function: Reasoning;Self Monitoring;Self Correcting;Organizing Reasoning:  Impaired Reasoning Impairment: Verbal basic;Functional  basic Organizing: Impaired Organizing Impairment: Verbal basic;Functional basic Self Monitoring: Impaired Self Monitoring Impairment: Verbal basic;Functional basic Self Correcting: Impaired Self Correcting Impairment: Verbal basic;Functional basic       Comprehension  Auditory Comprehension Overall Auditory Comprehension: Appears within functional limits for tasks assessed    Expression Expression Primary Mode of Expression: Verbal Verbal Expression Overall Verbal Expression: Appears within functional limits for tasks assessed Written Expression Dominant Hand: Right   Oral / Motor  Oral Motor/Sensory Function Overall Oral Motor/Sensory Function: Within functional limits Motor Speech Overall Motor Speech: Appears within functional limits for tasks assessed   GO                   Tishia Maestre B. Quentin Ore, [facility], [place] Speech Language Pathologist Office: [phone]  Shonna Chock 09/04/2020, 10:57 AM

## 2020-09-04 NOTE — Plan of Care (Signed)

## 2020-09-04 NOTE — Progress Notes (Signed)
PROGRESS NOTE  Tracey Perkins [id number] DOB: [birth year] DOA: 09/02/2020 PCP: Prince Solian, MD  HPI/Recap of past 4 hours: 35 year old female with past medical history of type 1 diabetes mellitus, hyperlipidemia, previous CVA and some mild cognitive deficit presented on early morning of 8/18 with loss of balance and slurred speech with frequent falls in the past week.  Work-up revealed left cerebral hemisphere CVA.  Patient admitted to hospitalist service and neurology consulted.  Work-up completed.  Patient changed from aspirin to aspirin/Plavix and eventually to Plavix.  Seen by PT who are recommending inpatient rehab.  Consultation pending.  Assessment/Plan: Principal Problem:   Acute CVA (cerebrovascular accident) [facility]): Appreciate neurology help.  Work-up complete.  Hemoglobin A1c at 8.5.  LDL 135 with goal below 70.  She previously had been on atorvastatin but intolerant due to myalgia.  Started on Crestor at low-dose.  Allowing for some permissive hypertension.  Noted to have some plaque, but no signs of significant stenosis.  Patient already on aspirin.  Plavix added and she will continue aspirin plus Plavix x3 weeks and then at that point, discontinue aspirin and continue Plavix indefinitely cleared by speech therapy for swallowing.  Seen by PT and OT who are recommending inpatient rehab.  Consult placed. Active Problems:   Mild persistent asthma without complication   Type 1 diabetes mellitus without complication ([place]): Continue insulin pump.  Patient has some mild cognitive impairments chronically.  She is status post medulloblastoma surgery resection in 1999 has had residual mild gait ataxia and has had previous bilateral subcortical lacunar strokes.  Continue insulin pump.  Constipation: Started on Reglan.  If constipation persists, we will try stronger medication    Chronic lymphocytic thyroiditis: Stable   Mild cognitive impairment   Diabetic gastroparesis ([place]):  Started on Reglan.  Asthma: Stable.  As needed albuterol  Code Status: Full code  Family Communication: Mother at bedside  Disposition Plan: Potential transfer to inpatient rehab   Consultants: Neurology  Procedures: Echocardiogram: Preserved ejection fraction, no evidence of diastolic dysfunction Carotid Dopplers: 50% stenosis on left ICA.  No signs of significant stenosis on either side  Antimicrobials: None  DVT prophylaxis: Lovenox  Level of care: Telemetry Medical   Objective: Vitals:   09/04/20 0751 09/04/20 1150  BP: 127/86 123/89  Pulse: 86 99  Resp: 16 16  Temp: 97.6 F (36.4 C) 98.4 F (36.9 C)  SpO2: 95% 95%   No intake or output data in the 24 hours ending 09/04/20 1602 Filed Weights   09/02/20 1835  Weight: 83.9 kg   Body mass index is 32.77 kg/m.  Exam:  General: Alert and oriented x3, no acute distress HEENT: Normocephalic, atraumatic, mucous membranes are moist Cardiovascular: Regular rate and rhythm, S1-S2 Respiratory: Clear to auscultation bilaterally Abdomen: Soft, nontender, nondistended, hypoactive bowel sounds Musculoskeletal: No clubbing or cyanosis or edema.  She is some generalized nonspecific weakness.  This is symmetric.  Her grip is a 5 -/5 in both hands as well as flexion extension of the bilateral upper and lower extremities. Skin: No skin breaks, tears or lesions Psychiatry: Appropriate, no evidence of psychoses Neurology: No focal abnormalities.  Gait not tested   Data Reviewed: CBC: Recent Labs  Lab 09/02/20 1840 09/03/20 0547  WBC 10.4 10.7*  HGB 11.8* 11.9*  HCT 35.1* 36.7  MCV 89.3 90.6  PLT 432* [id number]*   Basic Metabolic Panel: Recent Labs  Lab 09/02/20 1840 09/03/20 0547  NA 136  --   K 3.7  --  CL 99  --   CO2 29  --   GLUCOSE 220*  --   BUN 11  --   CREATININE 1.04* 1.04*  CALCIUM 9.0  --    GFR: CrCl cannot be calculated (Unknown ideal weight.). Liver Function Tests: Recent Labs  Lab  09/02/20 1840  AST 13*  ALT 14  ALKPHOS 78  BILITOT 0.1*  PROT 6.9  ALBUMIN 3.4*   No results for input(s): LIPASE, AMYLASE in the last 168 hours. No results for input(s): AMMONIA in the last 168 hours. Coagulation Profile: No results for input(s): INR, PROTIME in the last 168 hours. Cardiac Enzymes: No results for input(s): CKTOTAL, CKMB, CKMBINDEX, TROPONINI in the last 168 hours. BNP (last 3 results) No results for input(s): PROBNP in the last 8760 hours. HbA1C: Recent Labs    09/03/20 0547  HGBA1C 8.5*   CBG: Recent Labs  Lab 09/03/20 1832 09/03/20 2211 09/04/20 0607 09/04/20 0827 09/04/20 1154  GLUCAP 280* 205* 214* 202* 219*   Lipid Profile: Recent Labs    09/03/20 0547  CHOL 215*  HDL 40*  LDLCALC 135*  TRIG 198*  CHOLHDL 5.4   Thyroid Function Tests: No results for input(s): TSH, T4TOTAL, FREET4, T3FREE, THYROIDAB in the last 72 hours. Anemia Panel: No results for input(s): VITAMINB12, FOLATE, FERRITIN, TIBC, IRON, RETICCTPCT in the last 72 hours. Urine analysis:    Component Value Date/Time   COLORURINE YELLOW 09/02/2020 2104   APPEARANCEUR CLEAR 09/02/2020 2104   LABSPEC 1.010 09/02/2020 2104   PHURINE 6.0 09/02/2020 2104   GLUCOSEU 250 (A) 09/02/2020 2104   HGBUR LARGE (A) 09/02/2020 2104   BILIRUBINUR NEGATIVE 09/02/2020 2104   KETONESUR NEGATIVE 09/02/2020 2104   PROTEINUR NEGATIVE 09/02/2020 2104   UROBILINOGEN 0.2 07/14/2013 0746   NITRITE NEGATIVE 09/02/2020 2104   LEUKOCYTESUR NEGATIVE 09/02/2020 2104   Sepsis Labs: '@LABRCNTIP'$ (procalcitonin:4,lacticidven:4)  ) Recent Results (from the past 240 hour(s))  Resp Panel by RT-PCR (Flu A&B, Covid) Nasopharyngeal Swab     Status: None   Collection Time: 09/02/20 11:07 PM   Specimen: Nasopharyngeal Swab; Nasopharyngeal(NP) swabs in vial transport medium  Result Value Ref Range Status   SARS Coronavirus 2 by RT PCR NEGATIVE NEGATIVE Final    Comment: (NOTE) SARS-CoV-2 target nucleic  acids are NOT DETECTED.  The SARS-CoV-2 RNA is generally detectable in upper respiratory specimens during the acute phase of infection. The lowest concentration of SARS-CoV-2 viral copies this assay can detect is 138 copies/mL. A negative result does not preclude SARS-Cov-2 infection and should not be used as the sole basis for treatment or other patient management decisions. A negative result may occur with  improper specimen collection/handling, submission of specimen other than nasopharyngeal swab, presence of viral mutation(s) within the areas targeted by this assay, and inadequate number of viral copies(<138 copies/mL). A negative result must be combined with clinical observations, patient history, and epidemiological information. The expected result is Negative.  Fact Sheet for Patients:  EntrepreneurPulse.com.au  Fact Sheet for Healthcare Providers:  IncredibleEmployment.be  This test is no t yet approved or cleared by the [place] FDA and  has been authorized for detection and/or diagnosis of SARS-CoV-2 by FDA under an Emergency Use Authorization (EUA). This EUA will remain  in effect (meaning this test can be used) for the duration of the COVID-19 declaration under Section 564(b)(1) of the Act, 21 U.S.C.section 360bbb-3(b)(1), unless the authorization is terminated  or revoked sooner.       Influenza A by PCR NEGATIVE  NEGATIVE Final   Influenza B by PCR NEGATIVE NEGATIVE Final    Comment: (NOTE) The Xpert Xpress SARS-CoV-2/FLU/RSV plus assay is intended as an aid in the diagnosis of influenza from Nasopharyngeal swab specimens and should not be used as a sole basis for treatment. Nasal washings and aspirates are unacceptable for Xpert Xpress SARS-CoV-2/FLU/RSV testing.  Fact Sheet for Patients: EntrepreneurPulse.com.au  Fact Sheet for Healthcare Providers: IncredibleEmployment.be  This  test is not yet approved or cleared by the [place] FDA and has been authorized for detection and/or diagnosis of SARS-CoV-2 by FDA under an Emergency Use Authorization (EUA). This EUA will remain in effect (meaning this test can be used) for the duration of the COVID-19 declaration under Section 564(b)(1) of the Act, 21 U.S.C. section 360bbb-3(b)(1), unless the authorization is terminated or revoked.  Performed at [facility] [address] [place], [name] [address]       Studies: No results found.  Scheduled Meds:   stroke: mapping our early stages of recovery book   Does not apply Once   aspirin EC  81 mg Oral Daily   citalopram  5 mg Oral QHS   clopidogrel  75 mg Oral Daily   enoxaparin (LOVENOX) injection  40 mg Subcutaneous Q24H   gabapentin  300 mg Oral TID   insulin pump   Subcutaneous TID WC, HS, 0200   levothyroxine  100 mcg Oral QAC breakfast   loratadine  10 mg Oral Daily   lubiprostone  16 mcg Oral Q breakfast   magnesium oxide  800 mg Oral QHS   metoCLOPramide  5 mg Oral TID AC & HS   montelukast  10 mg Oral QHS   potassium chloride  10 mEq Oral Daily   rosuvastatin  5 mg Oral Daily   saccharomyces boulardii  250 mg Oral Daily   cyanocobalamin  1,000 mcg Oral QHS    Continuous Infusions:   LOS: 1 day     Annita Brod, MD [facility]   09/04/2020, 4:02 PM

## 2020-09-04 NOTE — Progress Notes (Signed)
STROKE TEAM PROGRESS NOTE    Interval History   No acute events overnight, patients mother is at the bedside.   She states she had a good night sleep last night and is feeling better.  She has been evaluated by therapy who recommended inpatient rehab carotid ultrasound showed no significant extracranial stenosis.  Neurological exam is unchanged.  Vital signs are stable.  She and her mother were informed about enrolling in the sleep smart stroke study - she is interested in participating and wants to learn more about the study from the research coordinator.   Pertinent Lab Work and Imaging    09/02/20 T Head WO IV Contrast Postsurgical changes in the posterior fossa consistent with the given clinical history. No acute abnormality is noted at this time.  09/03/20 MRA Head  Large and medium vessel narrowings, likely premature atherosclerosis given medical history.  09/02/20 MRI Brain WO IV Contrast 1. Punctate focus of abnormal diffusion restriction in the left cerebral peduncle may indicate a small acute infarct. 2. Postsurgical changes of posterior fossa tumor resection with moderate ventriculomegaly  Echocardiogram Complete  Left ventricular ejection fraction of 55 to 60%.  No cardiac source of embolism.  Bilateral CUS  Mild bilateral thickening but not significant stenosis  Physical Examination   Constitutional: Calm, appropriate for condition  Cardiovascular: Normal RR Respiratory: No increased WOB   Mental status: AAOx4, following commands Speech: Fluent repetition and naming intact  Cranial nerves: EOMI however she has horizontal nystagmus with L > R , VFF, Face symmetric, Tongue midline  Motor: Normal bulk and tone. No drift. Antigravity throughout  Sensory: Intact to light touch throughout  Coordination: FNF + HTS intact Gait: Deferred   NIHSS: 0   Assessment and Plan   Tracey Perkins is a 35 y.o. female w/pmh of asthma, Diabetes Type 1, hypothyroidism,  hx of  medulloblastoma s/p surgical resection x 2 and XRT + chemo with vincristine, hx of concussion last year and extensive rehab who presents with fall and gait abnormality.   #Left cerebral peduncle small lacunar Stroke in the setting of small vessel disease .  Prior silent bilateral subcortical lacunar strokes on imaging history of medulloblastoma surgery in 1999 with residual mild gait ataxia Patient presented with the symptoms described above. At this time, stroke work up is essentially complete aside from echocardiogram. MRA Head was pertinent for premature atherosclerosis. CUS ordered and pending. Stroke labs were completed including Lipid panel w/LDL 135 and Hemoglobin A1C 8.5.  Her MRI shows a potential stroke to the left cerebellar peduncle; suspect that this is a lacunar stroke in the setting of small vessel disease.  -DAPT for 21 days followed by Plavix monotherapy  -Continue Crestor 5 mg for stroke prevention ( Crestor chosen given statin intolerance)  -At discharge please place ambulatory referral to neurology for stroke follow up   #Permissive Hypertension  She does not have a history of hypertension and blood pressures are trending normotensive. Recommend permissive hypertension 48 hours post stroke and from there, gradually reduce the blood pressure, avoiding any acute drops. Long term blood pressure goal is < 140/90.  #Stroke Dysphagia Screening  Passed bedside swallow evaluation for a regular heart healthy diet   #Hyperlipidemia From a stroke prevention stand point, the LDL goal is < 70. LDL is 135, Crestor initiated for LDL control this admission. Recommend to continue at discharge.   #DMI Hemoglobin A1C this admission noted to be 8.5, not at goal from a stroke standpoint. Goal <  7. Recommend management with SSI this admission and to follow up outpatient with PCP at discharge.    Hospital day # 1   Patient presented with sudden onset of worsening gait ataxia and MRI scan shows  tiny punctate left cerebral peduncle lacunar infarct likely from small vessel disease.  MRI also shows what appears to be previously silent small bilateral thalamic lacunar infarct.  Continue ongoing stroke work-up.  Recommend aspirin and Plavix for 3 weeks followed by Plavix alone.  Patient also appears to be at risk for sleep apnea and is interested in considering participation in the sleep smart study.  She will be given information to review and decide.  Discussed with Dr. Willette Alma greater than 50% time during this 25-minute visit was spent in counseling and coordination of care and discussion with care team and answering questions.  Antony Contras, MD Medical Director [facility] Pager: [phone] 09/04/2020 3:23 PM  To contact Stroke Continuity provider, please refer to [email or web address] After hours, contact General Neurology

## 2020-09-04 NOTE — Progress Notes (Signed)
Inpatient Diabetes Program Recommendations  AACE/ADA: New Consensus Statement on Inpatient Glycemic Control (2015)  Target Ranges:  Prepandial:   less than 140 mg/dL      Peak postprandial:   less than 180 mg/dL (1-2 hours)      Critically ill patients:  140 - 180 mg/dL   Lab Results  Component Value Date   GLUCAP 219 (H) 09/04/2020   HGBA1C 8.5 (H) 09/03/2020    Review of Glycemic Control Results for Tracey Perkins, Tracey Perkins (MRN [id number]) as of 09/04/2020 14:58  Ref. Range 09/03/2020 22:11 09/04/2020 06:07 09/04/2020 08:27 09/04/2020 11:54  Glucose-Capillary Latest Ref Range: 70 - 99 mg/dL 205 (H) 214 (H) 202 (H) 219 (H)   Diabetes history: Type 1 DM Outpatient Diabetes medications: T Slim/Dexcom Current orders for Inpatient glycemic control: Insulin pump  Inpatient Diabetes Program Recommendations:    Spoke with patient's mother who helps to provide care to patient; patient asleep.  Reviewed current trends exceeding 200's mg/dL. Patient's mother explains that they have had to remove the Dexcom x2 for MRI related testing. She is at bedside with another Dexcom and has reached out to manufacturer for replacements.  Anticipates that the CBGS should improve with application of Dexcom. Plans to reapply once patient wakes up.  Confirmed current settings:  0000-0.8, 35, 8, 140 0300-0.8, 35, 8 0930- 1.8, 35, 6 1530- 1.9, 35, 6 1900- 2.0, 30, 7 2100-1.2, 30, 8  Plans to follow up with [facility] in September. Has no further questions at this time.  Thanks, Bronson Curb, MSN, RNC-OB Diabetes Coordinator [phone] (8a-5p)

## 2020-09-04 NOTE — Progress Notes (Signed)
Physical Therapy Treatment Patient Details Name: Tracey Perkins MRN: [id number] DOB: 1986-01-11 Today's Date: 09/04/2020    History of Present Illness Pt is a 35 y/o female admitted 8/17 secondary to unsteadiness. 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 PMH includes type 1 DM, asthma, head injury with resulting nystagmus, brain tumor s/p surgery.    PT Comments    Patient progressing towards physical therapy goals. Patient currently requiring minA for ambulation with RW. Patient reliant on at least 1 UE support with functional tasks. Patient demos decreased attention and slow processing. Recommend CIR for intensive therapies due to patient's functional decline and PLOF. Patient is highly motivated to regain her independence.    Follow Up Recommendations  CIR;Supervision for mobility/OOB     Equipment Recommendations   (TBD)    Recommendations for Other Services       Precautions / Restrictions Precautions Precautions: Fall Precaution Comments: Pt's mom reports she had a fall on 8/14 prior to admission Restrictions Weight Bearing Restrictions: No    Mobility  Bed Mobility Overal bed mobility: Needs Assistance Bed Mobility: Sit to Supine       Sit to supine: Supervision   General bed mobility comments: Sitting EOB with mother on arrival. Supervision to return to bed    Transfers Overall transfer level: Needs assistance Equipment used: Rolling Lyndel Dancel (2 wheeled) Transfers: Sit to/from Stand Sit to Stand: Min assist         General transfer comment: minA to rise and steady from low bed surface  Ambulation/Gait Ambulation/Gait assistance: Min assist Gait Distance (Feet): 200 Feet Assistive device: Rolling Najee Cowens (2 wheeled) Gait Pattern/deviations: Step-through pattern;Decreased stride length;Decreased weight shift to right;Decreased weight shift to left;Drifts right/left;Wide base of support Gait velocity: decreased   General Gait Details: minA for balance  and required cues for forward gaze. Patient reporting dizziness with head turns and downward gaze (patient does have history of childhood nystagmus). Dizziness improved with forward gaze   Stairs             Wheelchair Mobility    Modified Rankin (Stroke Patients Only) Modified Rankin (Stroke Patients Only) Pre-Morbid Rankin Score: No significant disability Modified Rankin: Moderately severe disability     Balance Overall balance assessment: Needs assistance Sitting-balance support: No upper extremity supported;Feet supported Sitting balance-Leahy Scale: Fair     Standing balance support: Bilateral upper extremity supported;During functional activity Standing balance-Leahy Scale: Poor Standing balance comment: reliant on UE support and external assist                            Cognition Arousal/Alertness: Awake/alert Behavior During Therapy: WFL for tasks assessed/performed Overall Cognitive Status: Impaired/Different from baseline Area of Impairment: Attention;Problem solving                   Current Attention Level: Sustained         Problem Solving: Slow processing General Comments: per SLP note, patient with history of cognitive impairments at baseline and mother and patient report being close to baseline. Patient with decreased attention and slow processing      Exercises      General Comments        Pertinent Vitals/Pain Pain Assessment: No/denies pain    Home Living Family/patient expects to be discharged to:: Private residence Living Arrangements: Parent Available Help at Discharge: Family;Available 24 hours/day Type of Home: House Home Access: Stairs to enter Entrance Stairs-Rails: Right;Left Home Layout: Two level  Prior Function            PT Goals (current goals can now be found in the care plan section) Acute Rehab PT Goals Patient Stated Goal: to get better PT Goal Formulation: With patient/family Time  For Goal Achievement: 09/17/20 Potential to Achieve Goals: Good Progress towards PT goals: Progressing toward goals    Frequency    Min 4X/week      PT Plan Discharge plan needs to be updated    Co-evaluation              AM-PAC PT "6 Clicks" Mobility   Outcome Measure  Help needed turning from your back to your side while in a flat bed without using bedrails?: A Little Help needed moving from lying on your back to sitting on the side of a flat bed without using bedrails?: A Little Help needed moving to and from a bed to a chair (including a wheelchair)?: A Lot Help needed standing up from a chair using your arms (e.g., wheelchair or bedside chair)?: A Little Help needed to walk in hospital room?: A Lot Help needed climbing 3-5 steps with a railing? : Total 6 Click Score: 14    End of Session Equipment Utilized During Treatment: Gait belt Activity Tolerance: Patient tolerated treatment well Patient left: in bed;with call bell/phone within reach;with family/visitor present Nurse Communication: Mobility status PT Visit Diagnosis: Unsteadiness on feet (R26.81);Muscle weakness (generalized) (M62.81);History of falling (Z91.81);Other abnormalities of gait and mobility (R26.89)     Time: [id number] PT Time Calculation (min) (ACUTE ONLY): 24 min  Charges:  $Gait Training: 8-22 mins                     Samarah Hogle A. Gilford Rile PT, DPT Acute Rehabilitation Services Pager [phone] Office [phone]    Linna Hoff 09/04/2020, 12:26 PM

## 2020-09-04 NOTE — Evaluation (Signed)
Occupational Therapy Evaluation Patient Details Name: Tracey Perkins MRN: [id number] DOB: Sep 13, 1985 Today's Date: 09/04/2020    History of Present Illness 35 y/o female admitted 8/17 secondary to unsteadiness. 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 PMH includes type 1 DM, asthma, head injury with resulting nystagmus, brain tumor s/p surgery.   Clinical Impression   PTA, pt was living with her parents and was performing BADLs and using a rollator for mobility in community. Pt currently requiring Min A for LB ADLs and functional mobility with RW. Pt presenting with decreased cognition, balance, activity tolerance, and safety. Pt and mother reporting she has to be able to get up her flight of stairs at home. Pt will require from further acute OT to facilitate safe dc. Recommend dc to CIR for intensive OT to optimize safety, independence with ADLs, and return to PLOF.     Follow Up Recommendations  CIR    Equipment Recommendations  None recommended by OT    Recommendations for Other Services PT consult     Precautions / Restrictions Precautions Precautions: Fall Precaution Comments: Pt's mom reports she had a fall on 8/14 prior to admission Restrictions Weight Bearing Restrictions: No      Mobility Bed Mobility Overal bed mobility: Needs Assistance Bed Mobility: Sit to Supine     Supine to sit: Min assist Sit to supine: Supervision   General bed mobility comments: Sitting EOB with mother on arrival. Supervision to return to bed    Transfers Overall transfer level: Needs assistance Equipment used: Rolling walker (2 wheeled) Transfers: Sit to/from Stand Sit to Stand: Min assist         General transfer comment: minA to rise and steady from low bed surface    Balance Overall balance assessment: Needs assistance Sitting-balance support: No upper extremity supported;Feet supported Sitting balance-Leahy Scale: Fair     Standing balance support: Bilateral upper  extremity supported;During functional activity Standing balance-Leahy Scale: Poor Standing balance comment: reliant on UE support and external assist                           ADL either performed or assessed with clinical judgement   ADL Overall ADL's : Needs assistance/impaired Eating/Feeding: Set up;Sitting   Grooming: Wash/dry face;Min guard;Standing   Upper Body Bathing: Min guard;Sitting   Lower Body Bathing: Minimal assistance;Sit to/from stand   Upper Body Dressing : Min guard;Sitting   Lower Body Dressing: Minimal assistance;Sit to/from stand   Toilet Transfer: Minimal assistance;Ambulation;Regular Toilet;Grab bars;RW Armed forces technical officer Details (indicate cue type and reason): Min A for safe descent and then power up Toileting- Water quality scientist and Hygiene: Min guard;Sitting/lateral lean Toileting - Clothing Manipulation Details (indicate cue type and reason): min guard A for safety     Functional mobility during ADLs: Min guard;Rolling walker General ADL Comments: Pt presenting with decreased balance and activity tolerance. Decreased functional performance. Dizziness with head turns imapcting her balance and safety     Vision         Perception     Praxis      Pertinent Vitals/Pain Pain Assessment: No/denies pain     Hand Dominance Right   Extremity/Trunk Assessment Upper Extremity Assessment Upper Extremity Assessment: Generalized weakness   Lower Extremity Assessment Lower Extremity Assessment: Defer to PT evaluation RLE Deficits / Details: Noted mild weakness as compared to the LLE. Grossly at 4/5 throughout.   Cervical / Trunk Assessment Cervical / Trunk Assessment: Normal  Communication Communication Communication: No difficulties   Cognition Arousal/Alertness: Awake/alert Behavior During Therapy: WFL for tasks assessed/performed Overall Cognitive Status: Impaired/Different from baseline Area of Impairment: Attention;Problem  solving                   Current Attention Level: Sustained         Problem Solving: Slow processing General Comments: Patient with history of cognitive impairments at baseline. Mother and patient report being close to baseline. Patient with decreased attention and slow processing. Very motivated.   General Comments  Mother present throughout    Exercises     Shoulder Instructions      Home Living Family/patient expects to be discharged to:: Private residence Living Arrangements: Parent Available Help at Discharge: Family;Available 24 hours/day Type of Home: House Home Access: Stairs to enter [employer] of Steps: 5 Entrance Stairs-Rails: Right;Left Home Layout: Two level Alternate Level Stairs-Number of Steps: 10 Alternate Level Stairs-Rails: Right Bathroom Shower/Tub: Walk-in shower;Tub/shower unit   Biochemist, clinical: Standard     Home Equipment: Clinical cytogeneticist - 4 wheels      Lives With: Family    Prior Functioning/Environment Level of Independence: Independent with assistive device(s)        Comments: Uses rollator for longer distances        OT Problem List: Decreased strength;Decreased range of motion;Decreased activity tolerance;Impaired balance (sitting and/or standing);Decreased knowledge of use of DME or AE;Decreased knowledge of precautions      OT Treatment/Interventions: Self-care/ADL training;Therapeutic exercise;Energy conservation;DME and/or AE instruction;Therapeutic activities;Balance training;Patient/family education    OT Goals(Current goals can be found in the care plan section) Acute Rehab OT Goals Patient Stated Goal: Return to normal OT Goal Formulation: With patient Time For Goal Achievement: 09/18/20 Potential to Achieve Goals: Good  OT Frequency: Min 2X/week   Barriers to D/C:            Co-evaluation              AM-PAC OT "6 Clicks" Daily Activity     Outcome Measure Help from another person  eating meals?: A Little Help from another person taking care of personal grooming?: A Little Help from another person toileting, which includes using toliet, bedpan, or urinal?: A Little Help from another person bathing (including washing, rinsing, drying)?: A Little Help from another person to put on and taking off regular upper body clothing?: A Little Help from another person to put on and taking off regular lower body clothing?: A Little 6 Click Score: 18   End of Session Equipment Utilized During Treatment: Rolling walker;Gait belt Nurse Communication: Mobility status  Activity Tolerance: Patient tolerated treatment well Patient left: in bed;with call bell/phone within reach;with family/visitor present  OT Visit Diagnosis: Unsteadiness on feet (R26.81);Other abnormalities of gait and mobility (R26.89);Muscle weakness (generalized) (M62.81)                Time: [id number] OT Time Calculation (min): 24 min Charges:  OT General Charges $OT Visit: 1 Visit OT Evaluation $OT Eval Moderate Complexity: [place], OTR/L Acute Rehab Pager: [phone] Office: [place] 09/04/2020, 1:01 PM

## 2020-09-05 LAB — GLUCOSE, CAPILLARY
Glucose-Capillary: 142 mg/dL — ABNORMAL HIGH (ref 70–99)
Glucose-Capillary: 146 mg/dL — ABNORMAL HIGH (ref 70–99)
Glucose-Capillary: 362 mg/dL — ABNORMAL HIGH (ref 70–99)
Glucose-Capillary: 269 mg/dL — ABNORMAL HIGH (ref 70–99)

## 2020-09-05 NOTE — Progress Notes (Addendum)
STROKE TEAM PROGRESS NOTE   ATTENDING NOTE: I reviewed above note and agree with the assessment and plan. Pt was seen and examined.   35 year old female with history of diabetes type 1, medulloblastoma status post surgery 1999, chemotherapy and radiation with residual mild gait ataxia, concussion last year with extensive rehab follow with [place] neurology admitted for fall and difficulty gait.  MRI showed left cerebral peduncle small infarct as well as silent bilateral chronic subcortical lacunar infarcts.  MRI showed bilateral PCA and left ICA siphon severe stenosis.  Carotid Doppler negative.  EF 55 to 60%, LDL 135, A1c 8.5.  On exam, patient lying in bed, parents at bedside.  She is awake alert, orientated x3, mildly bradyphonia with mild psychomotor slowing.  No aphasia, follows simple commands, able to name and repeat.  Mild horizontal nystagmus, otherwise no focal deficit.  Etiology for patient stroke likely small vessel disease.  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  Recommend DAPT for 3 weeks and then Plavix alone.  Continue Crestor.  Continue PT/OT.  Patient need continued follow-up with [place] neurology.  Had long discussion with patient and her parents regarding stroke risk factor modification.  For detailed assessment and plan, please refer to above as I have made changes wherever appropriate.   Neurology will sign off. Please call with questions. Pt will follow up with [place] neurology in about 3-4 weeks. Thanks for the consult.   Tracey Hawking, MD PhD Stroke Neurology 09/05/2020 8:15 PM    Interval History   No acute events overnight. Mother is at bedside.   Neurological examination is non focal and stable with an NIHSS of 0.  Patient and mother informed that stroke work up is complete and plan of DAPT  followed by Plavix monotherapy. Instructed to continue seeing [facility] for diabetes management.   Pertinent Lab Work and Imaging    09/02/20 T Head WO IV Contrast Postsurgical changes in the posterior fossa consistent with the given clinical history. No acute abnormality is noted at this time.  09/03/20 MRA Head  Large and medium vessel narrowings, likely premature atherosclerosis given medical history.  09/02/20 MRI Brain WO IV Contrast 1. Punctate focus of abnormal diffusion restriction in the left cerebral peduncle may indicate a small acute infarct. 2. Postsurgical changes of posterior fossa tumor resection with moderate ventriculomegaly  Echocardiogram Complete  Left ventricular ejection fraction of 55 to 60%.  No cardiac source of embolism.  Bilateral CUS  Mild bilateral thickening but not significant stenosis  Physical Examination   Constitutional: Calm, appropriate for condition  Cardiovascular: Normal RR Respiratory: No increased WOB   Mental status: AAOx4, following commands Speech: Fluent repetition and naming intact  Cranial nerves: EOMI however she has horizontal nystagmus with L > R , VFF, Face symmetric, Tongue midline  Motor: Normal bulk and tone. No drift. Antigravity throughout  Sensory: Intact to light touch throughout  Coordination: FNF + HTS intact Gait: Deferred   NIHSS: 0   Assessment and Plan   [name] is a 35 y.o. female w/pmh of asthma, Diabetes Type 1, hypothyroidism,  hx of medulloblastoma s/p surgical resection x 2 and XRT + chemo with vincristine, hx of concussion last year and extensive rehab who presents with fall and gait abnormality.   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 At this time, stroke work up is  complete. MRA Head was pertinent for premature atherosclerosis. CUS showed no significant stenosis. Echo with EF 55 to 60 %, no intracardiac thrombus, LA normal size. Stroke labs were completed including Lipid panel w/LDL 135 and Hemoglobin A1C 8.5.  Her MRI shows a potential stroke to the left cerebellar peduncle; suspect that this is a lacunar stroke in the setting of small vessel disease.  -DAPT for 21 days followed by Plavix monotherapy  -Continue Crestor 5 mg for stroke prevention ( Crestor chosen given statin intolerance)  -At discharge please place ambulatory referral to neurology for stroke follow up   #Permissive Hypertension  She does not have a history of hypertension and blood pressures are trending normotensive. Recommend permissive hypertension 48 hours post stroke and from there, gradually reduce the blood pressure, avoiding any acute drops. Long term blood pressure goal is < 140/90.  #Stroke Dysphagia Screening  Passed bedside swallow evaluation for a regular heart healthy diet   #Hyperlipidemia From a stroke prevention stand point, the LDL goal is < 70. LDL is 135, Crestor initiated for LDL control this admission. Recommend to continue at discharge.   #DMI Hemoglobin A1C this admission noted to be 8.5, not at goal from a stroke standpoint. Goal < 7. Recommend management with SSI this admission and to follow up outpatient with PCP at discharge.   Tracey Hinds, NP  Stroke Service Nurse Practitioner Patient seen and discussed with attending physician Dr. Erlinda Hong   Neurology will sign off at this time as stroke work up is complete. Please do not hesitate to reach out with questions via Amion or chat.   Hospital day # 2  To contact Stroke Continuity provider, please refer to [email or web address] After hours, contact General Neurology

## 2020-09-05 NOTE — Plan of Care (Signed)

## 2020-09-05 NOTE — Progress Notes (Signed)
Inpatient Rehab Admissions Coordinator:   I spoke with pt. And her mother regarding potential CIR admit. They are interested and state that Pt.'s parents  can provide 24/7 assist at home.   Clemens Catholic, [place], [place] Admissions Coordinator  [phone] ([place]) [phone] (office)

## 2020-09-05 NOTE — Progress Notes (Signed)
Physical Therapy Treatment Patient Details Name: ISLAND BESIC MRN: [id number] DOB: Jan 22, 1985 Today's Date: 09/05/2020    History of Present Illness 35 y/o female admitted 8/17 secondary to unsteadiness. 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 PMH includes type 1 DM, asthma, head injury with resulting nystagmus, brain tumor s/p surgery.    PT Comments    Pt seated edge of bed.   Father present at bedside.  Pt performed bouts of gt training with and without device.  Will plan to trial rollator next visit as she reports she uses a rollator at baseline.  Pt with LOB during session and poor obstacle negotiation.  Continue to recommend rehab in a post acute setting to improve balance, strength and function before returning home.    Follow Up Recommendations  CIR;Supervision for mobility/OOB     Equipment Recommendations   (TBD)    Recommendations for Other Services       Precautions / Restrictions Precautions Precautions: Fall Precaution Comments: Pt's mom reports she had a fall on 8/14 prior to admission Restrictions Weight Bearing Restrictions: No    Mobility  Bed Mobility               General bed mobility comments: Pt seated edge of bed on arrival.    Transfers Overall transfer level: Needs assistance Equipment used: Rolling walker (2 wheeled) Transfers: Sit to/from Stand Sit to Stand: Min assist         General transfer comment: Cues for sequencing to and from seated surface.  Ambulation/Gait Ambulation/Gait assistance: Min assist;Mod assist Gait Distance (Feet): 60 Feet (+ 30 ft no device and additional 60 ft with RW) Assistive device: Rolling walker (2 wheeled);None Gait Pattern/deviations: Step-through pattern;Decreased stride length;Decreased weight shift to right;Decreased weight shift to left;Drifts right/left;Wide base of support Gait velocity: decreased   General Gait Details: Pt with poor obstacle negotiation on R side.  Pt has poor safety  awareness and mild unsteadiness.  Noted with minor LOB min asistance to correct.  During trial without device require mod assistance.   Stairs             Wheelchair Mobility    Modified Rankin (Stroke Patients Only) Modified Rankin (Stroke Patients Only) Pre-Morbid Rankin Score: No significant disability Modified Rankin: Moderately severe disability     Balance Overall balance assessment: Needs assistance Sitting-balance support: No upper extremity supported;Feet supported Sitting balance-Leahy Scale: Fair       Standing balance-Leahy Scale: Poor Standing balance comment: reliant on UE support and external assist                            Cognition Arousal/Alertness: Awake/alert Behavior During Therapy: WFL for tasks assessed/performed Overall Cognitive Status: Impaired/Different from baseline Area of Impairment: Problem solving;Safety/judgement                         Safety/Judgement: Decreased awareness of safety;Decreased awareness of deficits   Problem Solving: Slow processing General Comments: Patient with history of cognitive impairments at baseline. Mother and patient report being close to baseline. Patient with decreased attention and slow processing. Very motivated.      Exercises Other Exercises Other Exercises: chair push-ups x 10 reps.    General Comments        Pertinent Vitals/Pain Pain Assessment: No/denies pain    Home Living  Prior Function            PT Goals (current goals can now be found in the care plan section) Acute Rehab PT Goals Patient Stated Goal: Return to normal Potential to Achieve Goals: Good Progress towards PT goals: Progressing toward goals    Frequency    Min 4X/week      PT Plan Current plan remains appropriate    Co-evaluation              AM-PAC PT "6 Clicks" Mobility   Outcome Measure  Help needed turning from your back to your side while  in a flat bed without using bedrails?: A Little Help needed moving from lying on your back to sitting on the side of a flat bed without using bedrails?: A Little Help needed moving to and from a bed to a chair (including a wheelchair)?: A Little Help needed standing up from a chair using your arms (e.g., wheelchair or bedside chair)?: A Little Help needed to walk in hospital room?: A Lot Help needed climbing 3-5 steps with a railing? : Total 6 Click Score: 15    End of Session Equipment Utilized During Treatment: Gait belt Activity Tolerance: Patient tolerated treatment well Patient left: in chair;with call bell/phone within reach;with family/visitor present Nurse Communication: Mobility status PT Visit Diagnosis: Unsteadiness on feet (R26.81);Muscle weakness (generalized) (M62.81);History of falling (Z91.81);Other abnormalities of gait and mobility (R26.89)     Time: [id number] PT Time Calculation (min) (ACUTE ONLY): 16 min  Charges:  $Gait Training: 8-22 mins                     Erasmo Leventhal , PTA Acute Rehabilitation Services Pager [phone] Office [phone],    Cristela Blue 09/05/2020, 4:22 PM

## 2020-09-05 NOTE — Progress Notes (Signed)
[facility] PROGRESS NOTE    Tracey Perkins  [id number] DOB: 1985/02/16 DOA: 09/02/2020 PCP: Prince Solian, MD      Brief Narrative:  Ms Tracey Perkins is a 35 y.o. F with hx brain tumor as child, prior stroke, severe concussion 1 year ago, DM, obesity, gastroparesis, hypothyroidism and cognitive impairment who presented with loss of balance, slurred speech.  In the ER imaging revealed left cerebral hemisphere CVA.     Assessment & Plan:  Acute ischemic stroke -Echocardiogram showed no cardiogenic source of embolism -Carotid imaging unremarkable   -Lipids ordered: Continue Crestor -Aspirin ordered at admission --> plan for discharge on aspirin plus Plavix for 3 weeks, then Plavix indefinitely -Atrial fibrillation: Not present -tPA not given because outside the window -Dysphagia screen ordered in ER -PT eval ordered: recommended CIR, placement pending -Smoking cessation: Not pertinent   Type 1 diabetes Glucose well controlled - Continue insulin pump -Continue gabapentin History of medulloblastoma resection, 1999, mild cognitive impairment at baseline  Constipation Gastroparesis - Continue Reglan lubiprostone, Mag-Ox      Disposition: Status is: Inpatient  Remains inpatient appropriate because:Unsafe d/c plan  Dispo: The patient is from: Home              Anticipated d/c is to: CIR              Patient currently is medically stable to d/c.   Difficult to place patient No       Level of care: Telemetry Medical       MDM: The below labs and imaging reports were reviewed and summarized above.  Medication management as above.   DVT prophylaxis: enoxaparin (LOVENOX) injection 40 mg Start: 09/03/20 1000  Code Status: Full code Family Communication: Mother at the bedside           Subjective: No new complaints  Objective: Vitals:   09/05/20 0303 09/05/20 0737 09/05/20 1255 09/05/20 1730  BP: 105/74 108/75 (!) 116/91  113/85  Pulse: 93 91 (!) 104 (!) 104  Resp: '18 17 18 18  '$ Temp: 98.6 F (37 C) 97.7 F (36.5 C) 98.3 F (36.8 C) 98.4 F (36.9 C)  TempSrc: Oral Oral Oral Oral  SpO2: 91% 96% 95% 96%  Weight:        Intake/Output Summary (Last 24 hours) at 09/05/2020 1811 Last data filed at 09/05/2020 0900 Gross per 24 hour  Intake 660 ml  Output --  Net 660 ml   Filed Weights   09/02/20 1835  Weight: 83.9 kg    Examination: General appearance:  adult female, alert and in no acute distress.  Lying in bed, watching a movie   Cardiac: RRR, nl S1-S2, no murmurs appreciated.   No LE edema.  Radial  pulses 2+ and symmetric.        Psych: Sensorium intact and responding to questions, attention normal. Affect normal.  Judgment and insight appear mildly impaired.    Data Reviewed: I have personally reviewed following labs and imaging studies:  CBC: Recent Labs  Lab 09/02/20 1840 09/03/20 0547  WBC 10.4 10.7*  HGB 11.8* 11.9*  HCT 35.1* 36.7  MCV 89.3 90.6  PLT 432* [id number]*   Basic Metabolic Panel: Recent Labs  Lab 09/02/20 1840 09/03/20 0547  NA 136  --   K 3.7  --   CL 99  --   CO2 29  --   GLUCOSE 220*  --   BUN 11  --   CREATININE 1.04* 1.04*  CALCIUM  9.0  --    GFR: CrCl cannot be calculated (Unknown ideal weight.). Liver Function Tests: Recent Labs  Lab 09/02/20 1840  AST 13*  ALT 14  ALKPHOS 78  BILITOT 0.1*  PROT 6.9  ALBUMIN 3.4*   No results for input(s): LIPASE, AMYLASE in the last 168 hours. No results for input(s): AMMONIA in the last 168 hours. Coagulation Profile: No results for input(s): INR, PROTIME in the last 168 hours. Cardiac Enzymes: No results for input(s): CKTOTAL, CKMB, CKMBINDEX, TROPONINI in the last 168 hours. BNP (last 3 results) No results for input(s): PROBNP in the last 8760 hours. HbA1C: Recent Labs    09/03/20 0547  HGBA1C 8.5*   CBG: Recent Labs  Lab 09/04/20 1154 09/04/20 1616 09/04/20 2142 09/05/20 0303 09/05/20 0624   GLUCAP 219* 220* 208* 142* 146*   Lipid Profile: Recent Labs    09/03/20 0547  CHOL 215*  HDL 40*  LDLCALC 135*  TRIG 198*  CHOLHDL 5.4   Thyroid Function Tests: No results for input(s): TSH, T4TOTAL, FREET4, T3FREE, THYROIDAB in the last 72 hours. Anemia Panel: No results for input(s): VITAMINB12, FOLATE, FERRITIN, TIBC, IRON, RETICCTPCT in the last 72 hours. Urine analysis:    Component Value Date/Time   COLORURINE YELLOW 09/02/2020 2104   APPEARANCEUR CLEAR 09/02/2020 2104   LABSPEC 1.010 09/02/2020 2104   PHURINE 6.0 09/02/2020 2104   GLUCOSEU 250 (A) 09/02/2020 2104   HGBUR LARGE (A) 09/02/2020 2104   BILIRUBINUR NEGATIVE 09/02/2020 2104   KETONESUR NEGATIVE 09/02/2020 2104   PROTEINUR NEGATIVE 09/02/2020 2104   UROBILINOGEN 0.2 07/14/2013 0746   NITRITE NEGATIVE 09/02/2020 2104   LEUKOCYTESUR NEGATIVE 09/02/2020 2104   Sepsis Labs: '@LABRCNTIP'$ (procalcitonin:4,lacticacidven:4)  ) Recent Results (from the past 240 hour(s))  Resp Panel by RT-PCR (Flu A&B, Covid) Nasopharyngeal Swab     Status: None   Collection Time: 09/02/20 11:07 PM   Specimen: Nasopharyngeal Swab; Nasopharyngeal(NP) swabs in vial transport medium  Result Value Ref Range Status   SARS Coronavirus 2 by RT PCR NEGATIVE NEGATIVE Final    Comment: (NOTE) SARS-CoV-2 target nucleic acids are NOT DETECTED.  The SARS-CoV-2 RNA is generally detectable in upper respiratory specimens during the acute phase of infection. The lowest concentration of SARS-CoV-2 viral copies this assay can detect is 138 copies/mL. A negative result does not preclude SARS-Cov-2 infection and should not be used as the sole basis for treatment or other patient management decisions. A negative result may occur with  improper specimen collection/handling, submission of specimen other than nasopharyngeal swab, presence of viral mutation(s) within the areas targeted by this assay, and inadequate number of viral copies(<138  copies/mL). A negative result must be combined with clinical observations, patient history, and epidemiological information. The expected result is Negative.  Fact Sheet for Patients:  EntrepreneurPulse.com.au  Fact Sheet for Healthcare Providers:  IncredibleEmployment.be  This test is no t yet approved or cleared by the [place] FDA and  has been authorized for detection and/or diagnosis of SARS-CoV-2 by FDA under an Emergency Use Authorization (EUA). This EUA will remain  in effect (meaning this test can be used) for the duration of the COVID-19 declaration under Section 564(b)(1) of the Act, 21 U.S.C.section 360bbb-3(b)(1), unless the authorization is terminated  or revoked sooner.       Influenza A by PCR NEGATIVE NEGATIVE Final   Influenza B by PCR NEGATIVE NEGATIVE Final    Comment: (NOTE) The Xpert Xpress SARS-CoV-2/FLU/RSV plus assay is intended as an aid in the  diagnosis of influenza from Nasopharyngeal swab specimens and should not be used as a sole basis for treatment. Nasal washings and aspirates are unacceptable for Xpert Xpress SARS-CoV-2/FLU/RSV testing.  Fact Sheet for Patients: EntrepreneurPulse.com.au  Fact Sheet for Healthcare Providers: IncredibleEmployment.be  This test is not yet approved or cleared by the [place] FDA and has been authorized for detection and/or diagnosis of SARS-CoV-2 by FDA under an Emergency Use Authorization (EUA). This EUA will remain in effect (meaning this test can be used) for the duration of the COVID-19 declaration under Section 564(b)(1) of the Act, 21 U.S.C. section 360bbb-3(b)(1), unless the authorization is terminated or revoked.  Performed at [facility], [address], [place], [place] [address]          Radiology Studies: No results found.      Scheduled Meds:   stroke: mapping our early stages of recovery  book   Does not apply Once   aspirin EC  81 mg Oral Daily   citalopram  5 mg Oral QHS   clopidogrel  75 mg Oral Daily   enoxaparin (LOVENOX) injection  40 mg Subcutaneous Q24H   gabapentin  300 mg Oral TID   insulin pump   Subcutaneous TID WC, HS, 0200   levothyroxine  100 mcg Oral QAC breakfast   loratadine  10 mg Oral Daily   lubiprostone  16 mcg Oral Q breakfast   magnesium oxide  800 mg Oral QHS   metoCLOPramide  5 mg Oral TID AC & HS   montelukast  10 mg Oral QHS   potassium chloride  10 mEq Oral Daily   rosuvastatin  5 mg Oral Daily   saccharomyces boulardii  250 mg Oral Daily   cyanocobalamin  1,000 mcg Oral QHS   Continuous Infusions:   LOS: 2 days    Time spent: 15 minutes    Edwin Dada, MD [facility] 09/05/2020, 6:11 PM     Please page though AMION or Epic secure chat:  For [employer], contact charge nurse      NUTRITION: The patient's BMI is: Body mass index is 32.77 kg/m.Marland Kitchen Seen by dietician.  I agree with the assessment and plan as outlined below: Nutrition Status:        .

## 2020-09-06 LAB — GLUCOSE, CAPILLARY
Glucose-Capillary: 216 mg/dL — ABNORMAL HIGH (ref 70–99)
Glucose-Capillary: 288 mg/dL — ABNORMAL HIGH (ref 70–99)
Glucose-Capillary: 208 mg/dL — ABNORMAL HIGH (ref 70–99)

## 2020-09-06 NOTE — Plan of Care (Signed)
  Problem: Education: Goal: Knowledge of General Education information will improve Description: Including pain rating scale, medication(s)/side effects and non-pharmacologic comfort measures Outcome: Progressing   Problem: Health Behavior/Discharge Planning: Goal: Ability to manage health-related needs will improve Outcome: Progressing   Problem: Clinical Measurements: Goal: Ability to maintain clinical measurements within normal limits will improve Outcome: Progressing Goal: Will remain free from infection Outcome: Progressing Goal: Diagnostic test results will improve Outcome: Progressing Goal: Respiratory complications will improve Outcome: Progressing Goal: Cardiovascular complication will be avoided Outcome: Progressing   Problem: Activity: Goal: Risk for activity intolerance will decrease Outcome: Progressing   Problem: Nutrition: Goal: Adequate nutrition will be maintained Outcome: Progressing   Problem: Coping: Goal: Level of anxiety will decrease Outcome: Progressing   Problem: Elimination: Goal: Will not experience complications related to bowel motility Outcome: Progressing Goal: Will not experience complications related to urinary retention Outcome: Progressing   Problem: Pain Managment: Goal: General experience of comfort will improve Outcome: Progressing   Problem: Education: Goal: Knowledge of disease or condition will improve Outcome: Progressing Goal: Knowledge of secondary prevention will improve Outcome: Progressing Goal: Knowledge of patient specific risk factors addressed and post discharge goals established will improve Outcome: Progressing   Problem: Skin Integrity: Goal: Risk for impaired skin integrity will decrease Outcome: Progressing   Problem: Self-Care: Goal: Ability to participate in self-care as condition permits will improve Outcome: Progressing Goal: Ability to communicate needs accurately will improve Outcome:  Progressing

## 2020-09-06 NOTE — Progress Notes (Signed)
[facility] PROGRESS NOTE    Tracey Perkins  [id number] DOB: 15-Jul-1985 DOA: 09/02/2020 PCP: Prince Solian, MD      Brief Narrative:  Patient is a 35 year old Caucasian female with past medical history significant for diabetes mellitus type 1 (on insulin pump), chronic lymphocytic thyroiditis, asthma and history of medulloblastoma status postsurgery and radiation in the 1990s.  Patient was admitted with acute left cerebral hemisphere CVA.  Patient is currently awaiting CIR.   Assessment & Plan:  Acute ischemic stroke -Echocardiogram showed no cardiogenic source of embolism -Carotid imaging unremarkable   -Lipids ordered: Continue Crestor -Aspirin ordered at admission --> plan for discharge on aspirin plus Plavix for 3 weeks, then Plavix indefinitely -Atrial fibrillation: Not present -tPA not given because outside the window -Dysphagia screen ordered in ER -PT eval ordered: recommended CIR, placement pending -Smoking cessation: Not pertinent   Type 1 diabetes mellitus: -Blood sugar has ranged from 1 46-3 62. -Continue insulin pump. -Continue to optimize.  History of medulloblastoma resection, 1999, mild cognitive impairment at baseline  Constipation Gastroparesis - Continue Reglan lubiprostone, Mag-Ox   Disposition: Status is: Inpatient  Remains inpatient appropriate because:Unsafe d/c plan  Dispo: The patient is from: Home              Anticipated d/c is to: CIR              Patient currently is medically stable to d/c.   Difficult to place patient No   Level of care: Telemetry Medical  DVT prophylaxis: enoxaparin (LOVENOX) injection 40 mg Start: 09/03/20 1000  Code Status: Full code Family Communication: Father.    Subjective: No new complaints  Objective: Vitals:   09/05/20 2339 09/06/20 0331 09/06/20 0840 09/06/20 1214  BP: 110/71 104/66 113/78 104/79  Pulse: (!) 108 (!) 103 92 (!) 101  Resp: '17 18 18 18  '$ Temp: 98.3 F (36.8  C) 98.6 F (37 C) 98.6 F (37 C) 98.3 F (36.8 C)  TempSrc: Oral Oral Oral Oral  SpO2: 95% 95% 93% 96%  Weight:       No intake or output data in the 24 hours ending 09/06/20 1602  Filed Weights   09/02/20 1835  Weight: 83.9 kg    Examination: General appearance: Patient is awake and alert.  Patient is not in any distress. HEENT: Patient is pale.  No jaundice. Neck: Supple.  No raised JVD. Lungs: Clear to auscultation. CVS: S1-S2. Abdomen: Obese, soft and nontender.  Organs are not palpable. Neuro: Awake and alert.  Patient moves all extremities.  Patient may have mild/minimal right-sided weakness. Extremities: No leg edema.    Data Reviewed: I have personally reviewed following labs and imaging studies:  CBC: Recent Labs  Lab 09/02/20 1840 09/03/20 0547  WBC 10.4 10.7*  HGB 11.8* 11.9*  HCT 35.1* 36.7  MCV 89.3 90.6  PLT 432* 421*    Basic Metabolic Panel: Recent Labs  Lab 09/02/20 1840 09/03/20 0547  NA 136  --   K 3.7  --   CL 99  --   CO2 29  --   GLUCOSE 220*  --   BUN 11  --   CREATININE 1.04* 1.04*  CALCIUM 9.0  --     GFR: CrCl cannot be calculated (Unknown ideal weight.). Liver Function Tests: Recent Labs  Lab 09/02/20 1840  AST 13*  ALT 14  ALKPHOS 78  BILITOT 0.1*  PROT 6.9  ALBUMIN 3.4*    No results for input(s): LIPASE,  AMYLASE in the last 168 hours. No results for input(s): AMMONIA in the last 168 hours. Coagulation Profile: No results for input(s): INR, PROTIME in the last 168 hours. Cardiac Enzymes: No results for input(s): CKTOTAL, CKMB, CKMBINDEX, TROPONINI in the last 168 hours. BNP (last 3 results) No results for input(s): PROBNP in the last 8760 hours. HbA1C: No results for input(s): HGBA1C in the last 72 hours.  CBG: Recent Labs  Lab 09/05/20 0624 09/05/20 1842 09/05/20 2107 09/06/20 0331 09/06/20 0609  GLUCAP 146* 362* 269* 216* 288*    Lipid Profile: No results for input(s): CHOL, HDL, LDLCALC, TRIG,  CHOLHDL, LDLDIRECT in the last 72 hours.  Thyroid Function Tests: No results for input(s): TSH, T4TOTAL, FREET4, T3FREE, THYROIDAB in the last 72 hours. Anemia Panel: No results for input(s): VITAMINB12, FOLATE, FERRITIN, TIBC, IRON, RETICCTPCT in the last 72 hours. Urine analysis:    Component Value Date/Time   COLORURINE YELLOW 09/02/2020 2104   APPEARANCEUR CLEAR 09/02/2020 2104   LABSPEC 1.010 09/02/2020 2104   PHURINE 6.0 09/02/2020 2104   GLUCOSEU 250 (A) 09/02/2020 2104   HGBUR LARGE (A) 09/02/2020 2104   BILIRUBINUR NEGATIVE 09/02/2020 2104   KETONESUR NEGATIVE 09/02/2020 2104   PROTEINUR NEGATIVE 09/02/2020 2104   UROBILINOGEN 0.2 07/14/2013 0746   NITRITE NEGATIVE 09/02/2020 2104   LEUKOCYTESUR NEGATIVE 09/02/2020 2104   Sepsis Labs: '@LABRCNTIP'$ (procalcitonin:4,lacticacidven:4)  ) Recent Results (from the past 240 hour(s))  Resp Panel by RT-PCR (Flu A&B, Covid) Nasopharyngeal Swab     Status: None   Collection Time: 09/02/20 11:07 PM   Specimen: Nasopharyngeal Swab; Nasopharyngeal(NP) swabs in vial transport medium  Result Value Ref Range Status   SARS Coronavirus 2 by RT PCR NEGATIVE NEGATIVE Final    Comment: (NOTE) SARS-CoV-2 target nucleic acids are NOT DETECTED.  The SARS-CoV-2 RNA is generally detectable in upper respiratory specimens during the acute phase of infection. The lowest concentration of SARS-CoV-2 viral copies this assay can detect is 138 copies/mL. A negative result does not preclude SARS-Cov-2 infection and should not be used as the sole basis for treatment or other patient management decisions. A negative result may occur with  improper specimen collection/handling, submission of specimen other than nasopharyngeal swab, presence of viral mutation(s) within the areas targeted by this assay, and inadequate number of viral copies(<138 copies/mL). A negative result must be combined with clinical observations, patient history, and  epidemiological information. The expected result is Negative.  Fact Sheet for Patients:  EntrepreneurPulse.com.au  Fact Sheet for Healthcare Providers:  IncredibleEmployment.be  This test is no t yet approved or cleared by the [place] FDA and  has been authorized for detection and/or diagnosis of SARS-CoV-2 by FDA under an Emergency Use Authorization (EUA). This EUA will remain  in effect (meaning this test can be used) for the duration of the COVID-19 declaration under Section 564(b)(1) of the Act, 21 U.S.C.section 360bbb-3(b)(1), unless the authorization is terminated  or revoked sooner.       Influenza A by PCR NEGATIVE NEGATIVE Final   Influenza B by PCR NEGATIVE NEGATIVE Final    Comment: (NOTE) The Xpert Xpress SARS-CoV-2/FLU/RSV plus assay is intended as an aid in the diagnosis of influenza from Nasopharyngeal swab specimens and should not be used as a sole basis for treatment. Nasal washings and aspirates are unacceptable for Xpert Xpress SARS-CoV-2/FLU/RSV testing.  Fact Sheet for Patients: EntrepreneurPulse.com.au  Fact Sheet for Healthcare Providers: IncredibleEmployment.be  This test is not yet approved or cleared by the [place] FDA  and has been authorized for detection and/or diagnosis of SARS-CoV-2 by FDA under an Emergency Use Authorization (EUA). This EUA will remain in effect (meaning this test can be used) for the duration of the COVID-19 declaration under Section 564(b)(1) of the Act, 21 U.S.C. section 360bbb-3(b)(1), unless the authorization is terminated or revoked.  Performed at [facility], [address] [place], [place] [address]          Radiology Studies: No results found.      Scheduled Meds:   stroke: mapping our early stages of recovery book   Does not apply Once   aspirin EC  81 mg Oral Daily   citalopram  5 mg Oral QHS    clopidogrel  75 mg Oral Daily   enoxaparin (LOVENOX) injection  40 mg Subcutaneous Q24H   gabapentin  300 mg Oral TID   insulin pump   Subcutaneous TID WC, HS, 0200   levothyroxine  100 mcg Oral QAC breakfast   loratadine  10 mg Oral Daily   lubiprostone  16 mcg Oral Q breakfast   magnesium oxide  800 mg Oral QHS   metoCLOPramide  5 mg Oral TID AC & HS   montelukast  10 mg Oral QHS   potassium chloride  10 mEq Oral Daily   rosuvastatin  5 mg Oral Daily   saccharomyces boulardii  250 mg Oral Daily   cyanocobalamin  1,000 mcg Oral QHS   Continuous Infusions:   LOS: 3 days    Time spent: 25 minutes    Bonnell Public, MD [facility] 09/06/2020, 4:02 PM     Please page though Shea Evans or Epic secure chat:  For [employer], contact charge nurse      NUTRITION: The patient's BMI is: Body mass index is 32.77 kg/m.Marland Kitchen Seen by dietician.  I agree with the assessment and plan as outlined below: Nutrition Status:        .

## 2020-09-07 LAB — GLUCOSE, CAPILLARY
Glucose-Capillary: 166 mg/dL — ABNORMAL HIGH (ref 70–99)
Glucose-Capillary: 175 mg/dL — ABNORMAL HIGH (ref 70–99)
Glucose-Capillary: 242 mg/dL — ABNORMAL HIGH (ref 70–99)

## 2020-09-07 MED ORDER — PANTOPRAZOLE SODIUM 20 MG PO TBEC: 20.0000 mg | DELAYED_RELEASE_TABLET | Freq: Every day | ORAL | 2 refills | Status: DC

## 2020-09-07 MED ORDER — CLOPIDOGREL BISULFATE 75 MG PO TABS
75.0000 mg | ORAL_TABLET | Freq: Every day | ORAL | 3 refills | Status: DC
Start: 2020-09-08 — End: 2021-01-16

## 2020-09-07 NOTE — Plan of Care (Signed)
  Problem: Education: Goal: Knowledge of General Education information will improve Description: Including pain rating scale, medication(s)/side effects and non-pharmacologic comfort measures 09/07/2020 1414 by Delia Chimes, RN Outcome: Adequate for Discharge 09/07/2020 1403 by Delia Chimes, RN Outcome: Adequate for Discharge 09/07/2020 1402 by Delia Chimes, RN Outcome: Adequate for Discharge   Problem: Health Behavior/Discharge Planning: Goal: Ability to manage health-related needs will improve 09/07/2020 1414 by Delia Chimes, RN Outcome: Adequate for Discharge 09/07/2020 1403 by Delia Chimes, RN Outcome: Adequate for Discharge 09/07/2020 1402 by Delia Chimes, RN Outcome: Adequate for Discharge   Problem: Elimination: Goal: Will not experience complications related to bowel motility 09/07/2020 1414 by Delia Chimes, RN Outcome: Adequate for Discharge 09/07/2020 1403 by Delia Chimes, RN Outcome: Adequate for Discharge 09/07/2020 1402 by Delia Chimes, RN Outcome: Adequate for Discharge Goal: Will not experience complications related to urinary retention 09/07/2020 1414 by Delia Chimes, RN Outcome: Adequate for Discharge 09/07/2020 1403 by Delia Chimes, RN Outcome: Adequate for Discharge 09/07/2020 1402 by Delia Chimes, RN Outcome: Adequate for Discharge   Problem: Ischemic Stroke/TIA Tissue Perfusion: Goal: Complications of ischemic stroke/TIA will be minimized 09/07/2020 1414 by Delia Chimes, RN Outcome: Adequate for Discharge 09/07/2020 1403 by Delia Chimes, RN Outcome: Adequate for Discharge 09/07/2020 1402 by Delia Chimes, RN Outcome: Adequate for Discharge

## 2020-09-07 NOTE — Plan of Care (Signed)
  Problem: Education: Goal: Knowledge of General Education information will improve Description: Including pain rating scale, medication(s)/side effects and non-pharmacologic comfort measures 09/07/2020 1403 by Delia Chimes, RN Outcome: Adequate for Discharge 09/07/2020 1402 by Delia Chimes, RN Outcome: Adequate for Discharge   Problem: Health Behavior/Discharge Planning: Goal: Ability to manage health-related needs will improve 09/07/2020 1403 by Delia Chimes, RN Outcome: Adequate for Discharge 09/07/2020 1402 by Delia Chimes, RN Outcome: Adequate for Discharge   Problem: Elimination: Goal: Will not experience complications related to bowel motility 09/07/2020 1403 by Delia Chimes, RN Outcome: Adequate for Discharge 09/07/2020 1402 by Delia Chimes, RN Outcome: Adequate for Discharge Goal: Will not experience complications related to urinary retention 09/07/2020 1403 by Delia Chimes, RN Outcome: Adequate for Discharge 09/07/2020 1402 by Delia Chimes, RN Outcome: Adequate for Discharge   Problem: Pain Managment: Goal: General experience of comfort will improve 09/07/2020 1403 by Delia Chimes, RN Outcome: Adequate for Discharge 09/07/2020 1402 by Delia Chimes, RN Outcome: Adequate for Discharge   Problem: Skin Integrity: Goal: Risk for impaired skin integrity will decrease 09/07/2020 1403 by Delia Chimes, RN Outcome: Adequate for Discharge 09/07/2020 1402 by Delia Chimes, RN Outcome: Adequate for Discharge   Problem: Education: Goal: Knowledge of disease or condition will improve 09/07/2020 1403 by Delia Chimes, RN Outcome: Adequate for Discharge 09/07/2020 1402 by Delia Chimes, RN Outcome: Adequate for Discharge Goal: Knowledge of secondary prevention will improve 09/07/2020 1403 by Delia Chimes, RN Outcome: Adequate for Discharge 09/07/2020 1402 by Delia Chimes, RN Outcome: Adequate for Discharge Goal: Knowledge of  patient specific risk factors addressed and post discharge goals established will improve 09/07/2020 1403 by Delia Chimes, RN Outcome: Adequate for Discharge 09/07/2020 1402 by Delia Chimes, RN Outcome: Adequate for Discharge   Problem: Ischemic Stroke/TIA Tissue Perfusion: Goal: Complications of ischemic stroke/TIA will be minimized 09/07/2020 1403 by Delia Chimes, RN Outcome: Adequate for Discharge 09/07/2020 1402 by Delia Chimes, RN Outcome: Adequate for Discharge

## 2020-09-07 NOTE — TOC Transition Note (Signed)
Transition of Care [facility]) - CM/SW Discharge Note   Patient Details  Name: LAKEISA TOWNS MRN: [id number] Date of Birth: 03-31-1985  Transition of Care [facility]) CM/SW Contact:  Pollie Friar, RN Phone Number: 09/07/2020, 12:11 PM   Clinical Narrative:    Patient lives at home with her mother who works from home. No issues with transportation or home medications. Pt did better with therapies today and new recommendation is home with outpatient therapy. Pt has been to [facility] in the past and would like to attend there again. CM has entered the orders and information is on the AVS. Mom to provide transport home today after pt sees PT again for stair training.    Final next level of care: OP Rehab Barriers to Discharge: No Barriers Identified   Patient Goals and CMS Choice   CMS Medicare.gov Compare Post Acute Care list provided to:: Patient Represenative (must comment) Choice offered to / list presented to : Patient, Parent  Discharge Placement                       Discharge Plan and Services                                     Social Determinants of Health (SDOH) Interventions     Readmission Risk Interventions No flowsheet data found.

## 2020-09-07 NOTE — Progress Notes (Signed)
Physical Therapy Treatment Patient Details Name: Tracey Perkins MRN: [id number] DOB: 1985/09/13 Today's Date: 09/07/2020    History of Present Illness 35 y/o female admitted 8/17 secondary to unsteadiness. 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 PMH includes type 1 DM, asthma, head injury with resulting nystagmus, brain tumor s/p surgery.    PT Comments    Pt progressing towards physical therapy goals. Focus of session was stair training with mother present and HEP. Pt negotiated stairs well sideways with BUE's on the railing, however pt still wanting to bump up the stairs on her bottom at home. Discussed safest way to sit and stand from the stairs. Pt anticipates d/c home today. Will continue to follow and progress as able per POC.    Follow Up Recommendations  CIR;Supervision for mobility/OOB     Equipment Recommendations  None recommended by PT    Recommendations for Other Services Rehab consult     Precautions / Restrictions Precautions Precautions: Fall Precaution Comments: Pt's mom reports she had a fall on 8/14 prior to admission Restrictions Weight Bearing Restrictions: No    Mobility  Bed Mobility Overal bed mobility: Needs Assistance Bed Mobility: Sit to Supine;Supine to Sit     Supine to sit: Supervision Sit to supine: Modified independent (Device/Increase time)   General bed mobility comments: Increased time and effort to transition to sitting. Noted some problem solving difficulties with attempting to figure out the best way for her to transition supine>sit.    Transfers Overall transfer level: Needs assistance Equipment used: 4-wheeled walker Transfers: Sit to/from Stand Sit to Stand: Supervision         General transfer comment: VC's for use of rollator brakes. No assist to power up to full stand.  Ambulation/Gait Ambulation/Gait assistance: Min guard Gait Distance (Feet): 250 Feet Assistive device: 4-wheeled walker Gait Pattern/deviations:  Step-through pattern;Decreased stride length;Decreased weight shift to right;Decreased weight shift to left;Drifts right/left;Wide base of support Gait velocity: decreased Gait velocity interpretation: 1.31 - 2.62 ft/sec, indicative of limited community ambulator General Gait Details: Tennis shoes donned for ambulation this afternoon. No assist required, however close guard provided for safety. Rollator lowered to a more appropriate height and posture improved.   Stairs Stairs: Yes Stairs assistance: Min guard Stair Management: Two rails;Step to pattern;Forwards;Sideways Number of Stairs: 10 (x2) General stair comments: Mom present for education. Hands on guarding throughout for safety. VC's for sequencing. Pt initially attempted forwards and then sideways. Pt and mom agree sideways would be a better option for home.   Wheelchair Mobility    Modified Rankin (Stroke Patients Only) Modified Rankin (Stroke Patients Only) Pre-Morbid Rankin Score: No significant disability Modified Rankin: Moderately severe disability     Balance Overall balance assessment: Needs assistance Sitting-balance support: No upper extremity supported;Feet supported Sitting balance-Leahy Scale: Fair     Standing balance support: Bilateral upper extremity supported;During functional activity;No upper extremity supported Standing balance-Leahy Scale: Fair Standing balance comment: Able to maintain static standing                 Standardized Balance Assessment Standardized Balance Assessment : Dynamic Gait Index   Dynamic Gait Index Level Surface: Mild Impairment Change in Gait Speed: Mild Impairment Gait with Horizontal Head Turns: Moderate Impairment Gait with Vertical Head Turns: Moderate Impairment Gait and Pivot Turn: Mild Impairment Step Over Obstacle: Mild Impairment Step Around Obstacles: Mild Impairment Steps: Moderate Impairment Total Score: 13      Cognition Arousal/Alertness:  Awake/alert Behavior During Therapy: WFL for tasks  assessed/performed Overall Cognitive Status: Impaired/Different from baseline Area of Impairment: Problem solving;Safety/judgement                   Current Attention Level: Selective     Safety/Judgement: Decreased awareness of safety;Decreased awareness of deficits   Problem Solving: Slow processing General Comments: Patient with history of cognitive impairments at baseline. Mother and patient report being close to baseline. Patient with decreased attention and slow processing. Very motivated.      Exercises Other Exercises Other Exercises: [employer] HEP provided: Access Code: [id number] Other Exercises: Yellow band provided and pt instructed in HEP (Supine hip abd/add, bridges, chair push ups, shoulder ER, shoulder press, scapular retraction, SLR)    General Comments General comments (skin integrity, edema, etc.): Mother present and supportive throughout      Pertinent Vitals/Pain Pain Assessment: No/denies pain    Home Living                      Prior Function            PT Goals (current goals can now be found in the care plan section) Acute Rehab PT Goals Patient Stated Goal: be able to manage stairs at home PT Goal Formulation: With patient/family Time For Goal Achievement: 09/17/20 Potential to Achieve Goals: Good Progress towards PT goals: Progressing toward goals    Frequency    Min 4X/week      PT Plan Current plan remains appropriate    Co-evaluation              AM-PAC PT "6 Clicks" Mobility   Outcome Measure  Help needed turning from your back to your side while in a flat bed without using bedrails?: None Help needed moving from lying on your back to sitting on the side of a flat bed without using bedrails?: A Little Help needed moving to and from a bed to a chair (including a wheelchair)?: A Little Help needed standing up from a chair using your arms (e.g., wheelchair or  bedside chair)?: A Little Help needed to walk in hospital room?: A Little Help needed climbing 3-5 steps with a railing? : A Little 6 Click Score: 19    End of Session Equipment Utilized During Treatment: Gait belt Activity Tolerance: Patient tolerated treatment well Patient left: with family/visitor present;in bed;with call bell/phone within reach Nurse Communication: Mobility status PT Visit Diagnosis: Unsteadiness on feet (R26.81);Muscle weakness (generalized) (M62.81);History of falling (Z91.81);Other abnormalities of gait and mobility (R26.89)     Time: 1335-1430 PT Time Calculation (min) (ACUTE ONLY): 55 min  Charges:  $Gait Training: 23-37 mins $Therapeutic Exercise: 23-37 mins $Physical Performance Test: 8-22 mins                     Rolinda Roan, PT, DPT Acute Rehabilitation Services Pager: [phone] Office: [place] 09/07/2020, 2:46 PM

## 2020-09-07 NOTE — Progress Notes (Signed)
Inpatient Rehab Admissions Coordinator:   I spoke with Pt. And mother and she states that she wants to go home with outpatient. TOC notified. CIR will sign off  Clemens Catholic, [place], [place] Admissions Coordinator  [phone] (celll) [phone] (office)

## 2020-09-07 NOTE — Plan of Care (Signed)
  Problem: Health Behavior/Discharge Planning: Goal: Ability to manage health-related needs will improve Outcome: Progressing   Problem: Clinical Measurements: Goal: Ability to maintain clinical measurements within normal limits will improve Outcome: Completed/Met Goal: Will remain free from infection Outcome: Completed/Met Goal: Diagnostic test results will improve Outcome: Completed/Met Goal: Respiratory complications will improve Outcome: Completed/Met Goal: Cardiovascular complication will be avoided Outcome: Completed/Met   Problem: Activity: Goal: Risk for activity intolerance will decrease Outcome: Completed/Met   Problem: Nutrition: Goal: Adequate nutrition will be maintained Outcome: Completed/Met   Problem: Coping: Goal: Level of anxiety will decrease Outcome: Completed/Met

## 2020-09-07 NOTE — Progress Notes (Signed)
Physical Therapy Treatment Patient Details Name: Tracey Perkins MRN: [id number] DOB: 11-25-1985 Today's Date: 09/07/2020    History of Present Illness 35 y/o female admitted 8/17 secondary to unsteadiness. 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 PMH includes type 1 DM, asthma, head injury with resulting nystagmus, brain tumor s/p surgery.    PT Comments    Pt progressing towards physical therapy goals. Was able to perform transfers and ambulation with gross min guard assist and rollator for support. Pt reports numbness in B feet improved to baseline. She does not feel she is 100% back to baseline but is getting close. Pt scored 13/24 on DGI this session, indicating she is at a higher risk for falls. Continue to recommend follow-up PT at d/c to maximize functional independence, decrease risk for falls, and improve overall tolerance for functional activity.    Follow Up Recommendations  CIR;Supervision for mobility/OOB     Equipment Recommendations   (TBD)    Recommendations for Other Services Rehab consult     Precautions / Restrictions Precautions Precautions: Fall Precaution Comments: Pt's mom reports she had a fall on 8/14 prior to admission Restrictions Weight Bearing Restrictions: No    Mobility  Bed Mobility Overal bed mobility: Needs Assistance Bed Mobility: Sit to Supine;Supine to Sit     Supine to sit: Supervision Sit to supine: Modified independent (Device/Increase time)   General bed mobility comments: HOB flat and min use of rails. No assist required. Supervision for safety as pt transitioned trunk to full sitting position with mod use of rails.    Transfers Overall transfer level: Needs assistance Equipment used: 4-wheeled walker Transfers: Sit to/from Stand Sit to Stand: Min guard         General transfer comment: VC's for hand placement on seated surface for safety and use of rollator brakes. No assist to power up to full  stand.  Ambulation/Gait Ambulation/Gait assistance: Min guard Gait Distance (Feet): 200 Feet Assistive device: Rolling walker (2 wheeled) Gait Pattern/deviations: Step-through pattern;Decreased stride length;Decreased weight shift to right;Decreased weight shift to left;Drifts right/left;Wide base of support Gait velocity: decreased Gait velocity interpretation: 1.31 - 2.62 ft/sec, indicative of limited community ambulator General Gait Details: Mild unsteadiness throughout however no overt LOB noted. Initially felt like pt was drifting more R than L, however eventually got into a even drifting pattern to both sides.   Stairs Stairs: Yes Stairs assistance: Min guard Stair Management: Two rails;Step to pattern;Forwards Number of Stairs: 5 General stair comments: Practice stiars in rehab gym. Pt managed well with BUE support. Will need to practice a full flight before d/c.   Wheelchair Mobility    Modified Rankin (Stroke Patients Only) Modified Rankin (Stroke Patients Only) Pre-Morbid Rankin Score: No significant disability Modified Rankin: Moderately severe disability     Balance Overall balance assessment: Needs assistance Sitting-balance support: No upper extremity supported;Feet supported Sitting balance-Leahy Scale: Fair     Standing balance support: Bilateral upper extremity supported;During functional activity;No upper extremity supported Standing balance-Leahy Scale: Fair Standing balance comment: Able to maintain static standing                 Standardized Balance Assessment Standardized Balance Assessment : Dynamic Gait Index   Dynamic Gait Index Level Surface: Mild Impairment Change in Gait Speed: Mild Impairment Gait with Horizontal Head Turns: Moderate Impairment Gait with Vertical Head Turns: Moderate Impairment Gait and Pivot Turn: Mild Impairment Step Over Obstacle: Mild Impairment Step Around Obstacles: Mild Impairment Steps: Moderate  Impairment Total  Score: 13      Cognition Arousal/Alertness: Awake/alert Behavior During Therapy: WFL for tasks assessed/performed Overall Cognitive Status: Impaired/Different from baseline Area of Impairment: Problem solving;Safety/judgement                   Current Attention Level: Selective     Safety/Judgement: Decreased awareness of safety;Decreased awareness of deficits   Problem Solving: Slow processing General Comments: Patient with history of cognitive impairments at baseline. Mother and patient report being close to baseline. Patient with decreased attention and slow processing. Very motivated.      Exercises      General Comments General comments (skin integrity, edema, etc.): Mother present and supportive throughout      Pertinent Vitals/Pain Pain Assessment: No/denies pain    Home Living                      Prior Function            PT Goals (current goals can now be found in the care plan section) Acute Rehab PT Goals Patient Stated Goal: be able to manage stairs at home PT Goal Formulation: With patient/family Time For Goal Achievement: 09/17/20 Potential to Achieve Goals: Good Progress towards PT goals: Progressing toward goals    Frequency    Min 4X/week      PT Plan Current plan remains appropriate    Co-evaluation              AM-PAC PT "6 Clicks" Mobility   Outcome Measure  Help needed turning from your back to your side while in a flat bed without using bedrails?: A Little Help needed moving from lying on your back to sitting on the side of a flat bed without using bedrails?: A Little Help needed moving to and from a bed to a chair (including a wheelchair)?: A Little Help needed standing up from a chair using your arms (e.g., wheelchair or bedside chair)?: A Little Help needed to walk in hospital room?: A Lot Help needed climbing 3-5 steps with a railing? : Total 6 Click Score: 15    End of Session  Equipment Utilized During Treatment: Gait belt Activity Tolerance: Patient tolerated treatment well Patient left: with family/visitor present;in bed;with call bell/phone within reach Nurse Communication: Mobility status PT Visit Diagnosis: Unsteadiness on feet (R26.81);Muscle weakness (generalized) (M62.81);History of falling (Z91.81);Other abnormalities of gait and mobility (R26.89)     Time: [id number] PT Time Calculation (min) (ACUTE ONLY): 34 min  Charges:  $Gait Training: 8-22 mins $Physical Performance Test: 8-22 mins                     Rolinda Roan, PT, DPT Acute Rehabilitation Services Pager: [phone] Office: [place] 09/07/2020, 11:46 AM

## 2020-09-07 NOTE — Progress Notes (Signed)
Occupational Therapy Treatment Patient Details Name: Tracey Perkins MRN: [id number] DOB: Apr 17, 1985 Today's Date: 09/07/2020    History of present illness 35 y/o female admitted 8/17 secondary to unsteadiness. 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 PMH includes type 1 DM, asthma, head injury with resulting nystagmus, brain tumor s/p surgery.   OT comments  Pt progressing towards established OT goals. Pt continues to be highly motivated to participate in therapy. Due to pt's deceased strength and balance compared to baseline, practicing tub transfer to optimize safety and independence. Pt requiring Min A for maintaining safety in standing. Pt and mother also reporting a LOB in bathroom earlier this morning. Pt continues to be at fall risk. Continue to recommend dc to CIR and will continue to follow acutely as admitted.   Follow Up Recommendations  CIR    Equipment Recommendations  None recommended by OT    Recommendations for Other Services PT consult    Precautions / Restrictions Precautions Precautions: Fall Precaution Comments: Pt's mom reports she had a fall on 8/14 prior to admission Restrictions Weight Bearing Restrictions: No       Mobility Bed Mobility Overal bed mobility: Needs Assistance Bed Mobility: Sit to Supine;Supine to Sit     Supine to sit: Supervision Sit to supine: Supervision   General bed mobility comments: Supervision for safety    Transfers Overall transfer level: Needs assistance Equipment used:  (rollator) Transfers: Sit to/from Stand Sit to Stand: Min guard         General transfer comment: Min Guard A for safety; cues for rollator management. Will need further education and practice    Balance Overall balance assessment: Needs assistance Sitting-balance support: No upper extremity supported;Feet supported Sitting balance-Leahy Scale: Fair     Standing balance support: Bilateral upper extremity supported;During functional  activity;No upper extremity supported Standing balance-Leahy Scale: Fair Standing balance comment: Able to maintain static standing                           ADL either performed or assessed with clinical judgement   ADL Overall ADL's : Needs assistance/impaired                         Toilet Transfer: Min guard;Ambulation;RW       Tub/ Shower Transfer: Supervision/safety;Tub bench;Minimal assistance;Ambulation;Tub transfer Tub/Shower Transfer Details (indicate cue type and reason): Pt performing tub transfer with bench and SUpervision. Pt performing step over tub transfer with Min A for safety and balance. Functional mobility during ADLs: Min guard (rollator) General ADL Comments: Focused session on tub transfer as pt and family nervous about tub transfers due to weakness and poor balance.     Vision       Perception     Praxis      Cognition Arousal/Alertness: Awake/alert Behavior During Therapy: WFL for tasks assessed/performed Overall Cognitive Status: Impaired/Different from baseline Area of Impairment: Problem solving;Safety/judgement                   Current Attention Level: Selective     Safety/Judgement: Decreased awareness of safety;Decreased awareness of deficits   Problem Solving: Slow processing General Comments: Patient with history of cognitive impairments at baseline. Mother and patient report being close to baseline. Patient with decreased attention and slow processing. Very motivated.        Exercises     Shoulder Instructions       General Comments  Mother present throughout    Pertinent Vitals/ Pain       Pain Assessment: No/denies pain  Home Living                                          Prior Functioning/Environment              Frequency  Min 2X/week        Progress Toward Goals  OT Goals(current goals can now be found in the care plan section)  Progress towards OT goals:  Progressing toward goals  Acute Rehab OT Goals Patient Stated Goal: Return to normal OT Goal Formulation: With patient Time For Goal Achievement: 09/18/20 Potential to Achieve Goals: Good ADL Goals Pt Will Perform Grooming: with modified independence;standing Pt Will Perform Lower Body Dressing: with modified independence;sit to/from stand Pt Will Transfer to Toilet: with modified independence;ambulating;regular height toilet Pt Will Perform Toileting - Clothing Manipulation and hygiene: with modified independence;sit to/from stand;sitting/lateral leans Pt Will Perform Tub/Shower Transfer: ambulating;with supervision;Shower transfer  Plan Discharge plan remains appropriate    Co-evaluation                 AM-PAC OT "6 Clicks" Daily Activity     Outcome Measure   Help from another person eating meals?: A Little Help from another person taking care of personal grooming?: A Little Help from another person toileting, which includes using toliet, bedpan, or urinal?: A Little Help from another person bathing (including washing, rinsing, drying)?: A Little Help from another person to put on and taking off regular upper body clothing?: A Little Help from another person to put on and taking off regular lower body clothing?: A Little 6 Click Score: 18    End of Session    OT Visit Diagnosis: Unsteadiness on feet (R26.81);Other abnormalities of gait and mobility (R26.89);Muscle weakness (generalized) (M62.81)   Activity Tolerance Patient tolerated treatment well   Patient Left in bed;with call bell/phone within reach;with family/visitor present   Nurse Communication Mobility status        Time: [id number] OT Time Calculation (min): 23 min  Charges: OT General Charges $OT Visit: 1 Visit OT Treatments $Self Care/Home Management : 23-37 mins  [place], OTR/L Acute Rehab Pager: [phone] Office: [place] 09/07/2020, 11:13  AM

## 2020-09-07 NOTE — Discharge Summary (Signed)
Physician Discharge Summary  Tracey Perkins [id number] DOB: 01/04/1986 DOA: 09/02/2020  PCP: Tracey Solian, MD  Admit date: 09/02/2020 Discharge date: 09/07/2020  Admitted From: Home  Disposition:  Home with outpatient PT   Recommendations for Outpatient Follow-up:  Follow up with Neurology in 4-6 weeks at [facility] Follow up with PCP in 1-2 weeks Follow up with OB-Gyn as needed Tracey Perkins: Please review indication for estrogen therapy post-stroke and discuss alternatives        Home Health: None  Equipment/Devices: None new  Discharge Condition: Good  CODE STATUS: FULL Diet recommendation: Cardiac  Brief/Interim Summary: Tracey Perkins is a 35 y.o. F with hx brain tumor as child, prior stroke, severe concussion 1 year ago, DM, obesity, gastroparesis, hypothyroidism and cognitive impairment who presented with loss of balance, slurred speech.  In the ER imaging revealed left cerebral hemisphere CVA.    PRINCIPAL HOSPITAL DIAGNOSIS: Acute stroke    Discharge Diagnoses:   Acute ischemic stroke Presented with fall and gait difficulty.  MRI showed left cerebral peducnle infarct and small silent bilateral chronic subcortical infarcts.  MRA showed bilateral PCA and left ICA siphon severe stenosis.    -Echocardiogram showed no cardiogenic source of embolism -Carotid imaging unremarkable   -Lipids ordered: discharged on new Crestor -Aspirin ordered at admission --> discharged on aspirin plus Plavix for 3 weeks, then Plavix indefinitely -Atrial fibrillation: Not present -tPA not given because outside the window -Dysphagia screen ordered in ER -PT eval ordered -Smoking cessation: Not pertinent     Type 1 diabetes Glucose well controlled on pump  History of medulloblastoma resection, 1999, mild cognitive impairment at baseline  Constipation Gastroparesis   Irregular menses Estrogen/progesterone OCP stopped.  Recommend follow up with Ob-GYN for discussion of  alternatives.      Discharge Instructions  Discharge Instructions     Ambulatory referral to Occupational Therapy   Complete by: As directed    Ambulatory referral to Physical Therapy   Complete by: As directed    Ambulatory referral to Speech Therapy   Complete by: As directed    Discharge instructions   Complete by: As directed    From Tracey Perkins: You were admitted for imbalance and found to have a small stroke in one of your cerebral peduncles This was small and thankfully and does not seem to have caused major deficits  Follow up with your Neurologist at [facility] in 4-6 weeks To prevent recurrent strokes, take aspirin 81 mg and Plavix 75 mg daily for 3 weeks  After three weeks, stop aspirin and take Plavix 75 mg daily indefinitely or unless your Neurologist tells you to stop NOTE: Omeprazole interacts with Plavix. STOP omeprazole  If you need an alternative antacid medicine, take pantoprazole (I have sent in a prescription for this) or you may try an alternative like famotidine 20 mg daily (this is Pepcid)    Also, take the cholesterol medicine Crestor daily Typically, people take this at night   Go see Dr. Dagmar Perkins, your primary care in 1 week  Do not take the birth control until you speak with Tracey Perkins and discuss alternatives and risk benefit   Increase activity slowly   Complete by: As directed       Allergies as of 09/07/2020       Reactions   Banana Other (See Comments)   Mouth burning and swollen   Bactrim [sulfamethoxazole-trimethoprim] Hives   Doxycycline Hives   Gentian Violet Other (See Comments)   "It burns."  Per  mother   Gentian Violet-proflavine Sulfate [triple Dye] Other (See Comments)   Mouth burn   Mold Extract [trichophyton] Other (See Comments)   Hay Fever   Morphine And Related Hives   Other Other (See Comments), Hives   "Hay fever, dust and pollen."  Per patient.   Vancomycin Other (See Comments)   Red man syndrome          Medication List     STOP taking these medications    metoCLOPramide 10 MG tablet Commonly known as: REGLAN   norgestimate-ethinyl estradiol 0.25-35 MG-MCG tablet Commonly known as: ORTHO-CYCLEN   omeprazole 20 MG capsule Commonly known as: PRILOSEC       TAKE these medications    acetaminophen 500 MG tablet Commonly known as: TYLENOL Take 500 mg by mouth every 6 (six) hours as needed for moderate pain.   aspirin EC 81 MG tablet Take 81 mg by mouth daily. Swallow whole.   azelastine 0.1 % nasal spray Commonly known as: ASTELIN Place 2 sprays into both nostrils 2 (two) times daily. What changed:  when to take this reasons to take this   cetirizine 10 MG tablet Commonly known as: ZYRTEC TAKE 1 TABLET BY MOUTH EVERY DAY   citalopram 10 MG tablet Commonly known as: CELEXA Take 0.5 tablets (5 mg total) by mouth daily. Patient will cut pill in half. She has a traumatic brain injury and will take half the dose.   clopidogrel 75 MG tablet Commonly known as: PLAVIX Take 1 tablet (75 mg total) by mouth daily. Start taking on: September 08, 2020   cyanocobalamin 1000 MCG tablet Take 1,000 mcg by mouth at bedtime.   DermOtic 0.01 % Oil Generic drug: Fluocinolone Acetonide Place 4 drops into both ears See admin instructions. Twice a week   Dexcom G6 Transmitter Misc USE 1 EACH EVERY 3 (THREE) MONTHS   EPINEPHrine 0.3 mg/0.3 mL Soaj injection Commonly known as: EPI-PEN Use as directed for severe allergic reaction.   FLORASTOR PO Take 1 capsule by mouth daily.   Flovent HFA 110 MCG/ACT inhaler Generic drug: fluticasone Inhale 2 puffs into the lungs daily. Rinse, gargle and spit out after use. What changed:  when to take this reasons to take this   fluticasone 50 MCG/ACT nasal spray Commonly known as: FLONASE 2 sprays per nostril daily as needed for stuffy nose. What changed:  how much to take how to take this when to take this reasons to take  this additional instructions   gabapentin 300 MG capsule Commonly known as: NEURONTIN Take 300 mg by mouth 3 (three) times daily.   glucagon 1 MG injection Inject 1 mg into the skin once as needed (for diabeties).   Glucagon 3 MG/DOSE Powd Place 3 mg into the nose as needed (low blood sugar).   insulin aspart 100 UNIT/ML injection Commonly known as: novoLOG USE UP TO 130 UNITS DAILY, AS DIRECTED IN INSULIN PUMP.   insulin glargine 100 UNIT/ML injection Commonly known as: LANTUS Inject 16 Units into the skin daily.   insulin pump Soln Inject 80 each into the skin daily. Novolog Insulin Pump   Klor-Con M10 10 MEQ tablet Generic drug: potassium chloride Take 10 mEq by mouth daily.   levothyroxine 100 MCG tablet Commonly known as: SYNTHROID Take 100 mcg by mouth daily before breakfast.   lubiprostone 8 MCG capsule Commonly known as: AMITIZA Take 16 mcg by mouth daily with breakfast.   magnesium oxide 400 MG tablet Commonly known as: MAG-OX Take  800 mg by mouth at bedtime.   meclizine 25 MG tablet Commonly known as: ANTIVERT Take 25 mg by mouth 2 (two) times daily as needed for dizziness.   montelukast 10 MG tablet Commonly known as: SINGULAIR TAKE 1 TABLET (10 MG TOTAL) BY MOUTH AT BEDTIME. TO PREVENT COUGHING OR WHEEZING   ondansetron 4 MG tablet Commonly known as: ZOFRAN Take 1 tablet (4 mg total) by mouth every 8 (eight) hours as needed for nausea or vomiting.   pantoprazole 20 MG tablet Commonly known as: Protonix Take 1 tablet (20 mg total) by mouth daily.   ProAir HFA 108 (90 Base) MCG/ACT inhaler Generic drug: albuterol TAKE 2 PUFFS BY MOUTH EVERY 4 HOURS AS NEEDED What changed: See the new instructions.   rosuvastatin 5 MG tablet Commonly known as: CRESTOR Take 1 tablet (5 mg total) by mouth daily.   SYSTANE OP Place 1 drop into both eyes daily as needed.        Follow-up [facility]. Schedule an appointment as soon as  possible for a visit in 1 month(s).          [facility] Follow up.   Specialty: Rehabilitation Why: Please call the outpatient rehab to schedule an appointment Contact information: [facility]        Tracey Solian, MD. Schedule an appointment as soon as possible for a visit in 1 week(s).   Specialty: Internal Medicine Contact information: [name] [address] [phone]                Allergies  Allergen Reactions   Banana Other (See Comments)    Mouth burning and swollen   Bactrim [Sulfamethoxazole-Trimethoprim] Hives   Doxycycline Hives   Gentian Violet Other (See Comments)    "It burns."  Per mother   Gentian Violet-Proflavine Sulfate [Triple Dye] Other (See Comments)    Mouth burn   Mold Extract [Trichophyton] Other (See Comments)    Hay Fever   Morphine And Related Hives   Other Other (See Comments) and Hives    "Hay fever, dust and pollen."  Per patient.   Vancomycin Other (See Comments)    Red man syndrome     Consultations: Neurology   Procedures/Studies: CT HEAD WO CONTRAST (5MM)  Result Date: 09/02/2020 CLINICAL DATA:  History of medulloblastoma with prior resection and recent head trauma, initial encounter EXAM: CT HEAD WITHOUT CONTRAST TECHNIQUE: Contiguous axial images were obtained from the base of the skull through the vertex without intravenous contrast. COMPARISON:  By report from 05/31/2002 FINDINGS: Brain: Postsurgical changes are noted in the posterior fossa with a cystic component identified in the midline similar to that described on the prior exam. The overall appearance is stable. Some calcifications are noted within the right cerebellar hemisphere likely related to the prior surgery. Large posterior occipital defect is noted. No acute hemorrhage or infarct is noted. Vascular: No hyperdense vessel or  unexpected calcification. Skull: Posterior occipital defect is noted. No acute abnormality seen. Sinuses/Orbits: No acute finding. Other: None. IMPRESSION: Postsurgical changes in the posterior fossa consistent with the given clinical history. No acute abnormality is noted at this time. Electronically Signed   By: Inez Catalina M.D.   On: 09/02/2020 20:45   MR ANGIO HEAD WO CONTRAST  Result Date: 09/03/2020 CLINICAL DATA:  Stroke workup EXAM: MRA HEAD WITHOUT CONTRAST TECHNIQUE: Angiographic images of the Circle of Willis were  acquired using MRA technique without intravenous contrast. COMPARISON:  Brain MRI from earlier today FINDINGS: Anterior circulation: Major vessels are smoothly contoured. Left paraclinoid ICA narrowing measuring at least 50%. MCA branch narrowings which are mild-to-moderate. Favor premature atherosclerosis given medical history. No aneurysm. Posterior circulation: Vertebral and basilar arteries are smoothly contoured and widely patent. Bilateral PCA branch narrowings, including a moderate right P2 segment stenosis. No branch occlusion or aneurysm. Anatomic variants: None significant IMPRESSION: Large and medium vessel narrowings, likely premature atherosclerosis given medical history. Electronically Signed   By: Monte Fantasia M.D.   On: 09/03/2020 06:40   MR BRAIN WO CONTRAST  Result Date: 09/03/2020 CLINICAL DATA:  Neuro deficit, acute, stroke suspected. Recent viral illness. Unsteady gait. History of posterior fossa brain tumor removal. EXAM: MRI HEAD WITHOUT CONTRAST TECHNIQUE: Multiplanar, multiecho pulse sequences of the brain and surrounding structures were obtained without intravenous contrast. COMPARISON:  None. FINDINGS: Brain: Punctate focus of abnormal diffusion restriction in the cerebral peduncle. Postsurgical changes of posterior fossa tumor resection with encephalomalacia of the left cerebellar hemisphere. Chronic microhemorrhage in the right occipital and left frontal  lobes. Moderate ventriculomegaly. Mild hyperintense T2-weighted signal at the occipital horns of both lateral ventricles. The midline structures are normal. Vascular: Major flow voids are preserved. Skull and upper cervical spine: Normal calvarium and skull base. Visualized upper cervical spine and soft tissues are normal. Sinuses/Orbits:No paranasal sinus fluid levels or advanced mucosal thickening. No mastoid or middle ear effusion. Normal orbits. IMPRESSION: 1. Punctate focus of abnormal diffusion restriction in the left cerebral peduncle may indicate a small acute infarct. 2. Postsurgical changes of posterior fossa tumor resection with moderate ventriculomegaly Electronically Signed   By: Ulyses Jarred M.D.   On: 09/03/2020 01:48   DG Chest Portable 1 View  Result Date: 09/02/2020 CLINICAL DATA:  Cough and upper respiratory symptoms.  Weakness. EXAM: PORTABLE CHEST 1 VIEW COMPARISON:  None. FINDINGS: Lung volumes are low.The cardiomediastinal contours are normal. Pulmonary vasculature is normal for technique. No consolidation, pleural effusion, or pneumothorax. No acute osseous abnormalities are seen. IMPRESSION: Low lung volumes without acute findings. Electronically Signed   By: Keith Rake M.D.   On: 09/02/2020 23:21   ECHOCARDIOGRAM COMPLETE  Result Date: 09/03/2020    ECHOCARDIOGRAM REPORT   Patient Name:   Tracey Perkins Date of Exam: 09/03/2020 Medical Rec #:  [id number]      Height:       63.0 in Accession #:    [id number]     Weight:       185.0 lb Date of Birth:  05-28-1985     BSA:          1.871 m Patient Age:    34 years       BP:           120/83 mmHg Patient Gender: F              HR:           89 bpm. Exam Location:  Inpatient Procedure: 2D Echo, Cardiac Doppler, Color Doppler and Intracardiac            Opacification Agent Indications:    Stroke  History:        Patient has no prior history of Echocardiogram examinations.                 Stroke; Risk Factors:Diabetes.  Sonographer:     Roseanna Rainbow RDCS Referring Phys: [place]  Sonographer Comments:  Technically difficult study due to poor echo windows. Image acquisition challenging due to patient body habitus. IMPRESSIONS  1. Left ventricular ejection fraction, by estimation, is 55 to 60%. The left ventricle has normal function. The left ventricle has no regional wall motion abnormalities. Left ventricular diastolic parameters were normal.  2. Right ventricular systolic function is normal. The right ventricular size is normal. Tricuspid regurgitation signal is inadequate for assessing PA pressure.  3. The mitral valve is normal in structure. No evidence of mitral valve regurgitation.  4. The aortic valve was not well visualized. Aortic valve regurgitation is not visualized. No aortic stenosis is present.  5. The inferior vena cava is normal in size with greater than 50% respiratory variability, suggesting right atrial pressure of 3 mmHg. FINDINGS  Left Ventricle: Left ventricular ejection fraction, by estimation, is 55 to 60%. The left ventricle has normal function. The left ventricle has no regional wall motion abnormalities. Definity contrast agent was given IV to delineate the left ventricular  endocardial borders. The left ventricular internal cavity size was normal in size. There is no left ventricular hypertrophy. Left ventricular diastolic parameters were normal. Right Ventricle: The right ventricular size is normal. No increase in right ventricular wall thickness. Right ventricular systolic function is normal. Tricuspid regurgitation signal is inadequate for assessing PA pressure. Left Atrium: Left atrial size was normal in size. Right Atrium: Right atrial size was normal in size. Pericardium: There is no evidence of pericardial effusion. Mitral Valve: The mitral valve is normal in structure. No evidence of mitral valve regurgitation. Tricuspid Valve: The tricuspid valve is normal in structure. Tricuspid valve regurgitation  is trivial. Aortic Valve: The aortic valve was not well visualized. Aortic valve regurgitation is not visualized. No aortic stenosis is present. Pulmonic Valve: The pulmonic valve was not well visualized. Pulmonic valve regurgitation is not visualized. Aorta: The aortic root and ascending aorta are structurally normal, with no evidence of dilitation. Venous: The inferior vena cava is normal in size with greater than 50% respiratory variability, suggesting right atrial pressure of 3 mmHg. IAS/Shunts: The interatrial septum was not well visualized.  LEFT VENTRICLE PLAX 2D LVIDd:         4.20 cm     Diastology LVIDs:         2.90 cm     LV e' medial:    6.74 cm/s LV PW:         0.90 cm     LV E/e' medial:  15.9 LV IVS:        0.80 cm     LV e' lateral:   11.10 cm/s LVOT diam:     2.00 cm     LV E/e' lateral: 9.6 LV SV:         59 LV SV Index:   32 LVOT Area:     3.14 cm  LV Volumes (MOD) LV vol d, MOD A2C: 72.9 ml LV vol d, MOD A4C: 79.1 ml LV vol s, MOD A2C: 24.0 ml LV vol s, MOD A4C: 36.1 ml LV SV MOD A2C:     48.9 ml LV SV MOD A4C:     79.1 ml LV SV MOD BP:      45.9 ml RIGHT VENTRICLE            IVC RV S prime:     8.38 cm/s  IVC diam: 1.30 cm TAPSE (M-mode): 1.4 cm LEFT ATRIUM           Index  RIGHT ATRIUM          Index LA diam:      2.00 cm 1.07 cm/m  RA Area:     7.97 cm LA Vol (A2C): 18.2 ml 9.73 ml/m  RA Volume:   13.50 ml 7.22 ml/m LA Vol (A4C): 18.7 ml 10.00 ml/m  AORTIC VALVE LVOT Vmax:   109.00 cm/s LVOT Vmean:  77.100 cm/s LVOT VTI:    0.189 m  AORTA Ao Root diam: 3.10 cm Ao Asc diam:  3.20 cm MITRAL VALVE MV Area (PHT): 5.02 cm     SHUNTS MV Decel Time: 151 msec     Systemic VTI:  0.19 m MV E velocity: 107.00 cm/s  Systemic Diam: 2.00 cm MV A velocity: 113.00 cm/s MV E/A ratio:  0.95 Oswaldo Milian MD Electronically signed by Oswaldo Milian MD Signature Date/Time: 09/03/2020/4:17:05 PM    Final    VAS US CAROTID  Result Date: 09/04/2020 Carotid Arterial Duplex Study Patient  Name:  Tracey Perkins  Date of Exam:   09/03/2020 Medical Rec #: [id number]       Accession #:    [id number] Date of Birth: Jan 03, 1986      Patient Gender: F Patient Age:   68 years Exam Location:  [facility] Procedure:      VAS US CAROTID Referring Phys: COURTNEY HEARD --------------------------------------------------------------------------------  Indications:       Gait difficulties. Risk Factors:      Diabetes. Other Factors:     History of medulloblastoma status post surgery and radiation                    in the 90s.History of chronic lymphocytic thyroiditis. Comparison Study:  No prior study on file Performing Technologist: Sharion Dove RVS  Examination Guidelines: A complete evaluation includes B-mode imaging, spectral Doppler, color Doppler, and power Doppler as needed of all accessible portions of each vessel. Bilateral testing is considered an integral part of a complete examination. Limited examinations for reoccurring indications may be performed as noted.  Right Carotid Findings: +----------+--------+--------+--------+------------------+------------------+           PSV cm/sEDV cm/sStenosisPlaque DescriptionComments           +----------+--------+--------+--------+------------------+------------------+ CCA Prox  62      26                                intimal thickening +----------+--------+--------+--------+------------------+------------------+ CCA Distal66      28                                intimal thickening +----------+--------+--------+--------+------------------+------------------+ ICA Prox  75      33                                                   +----------+--------+--------+--------+------------------+------------------+ ICA Distal78      36                                                   +----------+--------+--------+--------+------------------+------------------+ ECA       68  11                                                    +----------+--------+--------+--------+------------------+------------------+ +----------+--------+-------+--------+-------------------+           PSV cm/sEDV cmsDescribeArm Pressure (mmHG) +----------+--------+-------+--------+-------------------+ [id number]                                         +----------+--------+-------+--------+-------------------+ +---------+--------+--+--------+--+ VertebralPSV cm/s33EDV cm/s10 +---------+--------+--+--------+--+  Left Carotid Findings: +----------+--------+--------+--------+------------------+------------------+           PSV cm/sEDV cm/sStenosisPlaque DescriptionComments           +----------+--------+--------+--------+------------------+------------------+ CCA Prox  67      18                                intimal thickening +----------+--------+--------+--------+------------------+------------------+ CCA Distal78      28                                intimal thickening +----------+--------+--------+--------+------------------+------------------+ ICA Prox  57      26                                                   +----------+--------+--------+--------+------------------+------------------+ ICA Distal81      40                                                   +----------+--------+--------+--------+------------------+------------------+ ECA       75      21                                                   +----------+--------+--------+--------+------------------+------------------+ +----------+--------+--------+--------+-------------------+           PSV cm/sEDV cm/sDescribeArm Pressure (mmHG) +----------+--------+--------+--------+-------------------+ [id number]                                          +----------+--------+--------+--------+-------------------+ +---------+--------+--+--------+--+ VertebralPSV cm/s61EDV cm/s24 +---------+--------+--+--------+--+   Summary: Right  Carotid: The extracranial vessels were near-normal with only minimal wall                thickening or plaque. Left Carotid: The extracranial vessels were near-normal with only minimal wall               thickening or plaque. Vertebrals:  Bilateral vertebral arteries demonstrate antegrade flow. Subclavians: Normal flow hemodynamics were seen in bilateral subclavian              arteries. *See table(s) above for measurements and observations.  Electronically signed by Antony Contras MD on 09/04/2020 at 1:41:34 PM.    Final  Subjective: Feeling well.  Her chronic neuropathy feels better today.  No new focal neurological deficits.  Discharge Exam: Vitals:   09/07/20 0714 09/07/20 1114  BP: 109/80 121/89  Pulse: 84 (!) 101  Resp: 18 20  Temp: 98.4 F (36.9 C) 98.3 F (36.8 C)  SpO2:  95%   Vitals:   09/06/20 2019 09/07/20 0437 09/07/20 0714 09/07/20 1114  BP: 115/76 118/71 109/80 121/89  Pulse: 92 86 84 (!) 101  Resp: '17  18 20  '$ Temp:  98.5 F (36.9 C) 98.4 F (36.9 C) 98.3 F (36.8 C)  TempSrc: Oral Oral Oral Oral  SpO2: 96% 97%  95%  Weight:        General: Pt is alert, awake, not in acute distress Cardiovascular: RRR, nl S1-S2, no murmurs appreciated.   No LE edema.   Respiratory: Normal respiratory rate and rhythm.  CTAB without rales or wheezes. Abdominal: Abdomen soft and non-tender.  No distension or HSM.   Neuro/Psych: Strength symmetric in upper and lower extremities.  Judgment and insight appear impaired.   The results of significant diagnostics from this hospitalization (including imaging, microbiology, ancillary and laboratory) are listed below for reference.     Microbiology: Recent Results (from the past 240 hour(s))  Resp Panel by RT-PCR (Flu A&B, Covid) Nasopharyngeal Swab     Status: None   Collection Time: 09/02/20 11:07 PM   Specimen: Nasopharyngeal Swab; Nasopharyngeal(NP) swabs in vial transport medium  Result Value Ref Range Status   SARS  Coronavirus 2 by RT PCR NEGATIVE NEGATIVE Final    Comment: (NOTE) SARS-CoV-2 target nucleic acids are NOT DETECTED.  The SARS-CoV-2 RNA is generally detectable in upper respiratory specimens during the acute phase of infection. The lowest concentration of SARS-CoV-2 viral copies this assay can detect is 138 copies/mL. A negative result does not preclude SARS-Cov-2 infection and should not be used as the sole basis for treatment or other patient management decisions. A negative result may occur with  improper specimen collection/handling, submission of specimen other than nasopharyngeal swab, presence of viral mutation(s) within the areas targeted by this assay, and inadequate number of viral copies(<138 copies/mL). A negative result must be combined with clinical observations, patient history, and epidemiological information. The expected result is Negative.  Fact Sheet for Patients:  EntrepreneurPulse.com.au  Fact Sheet for Healthcare Providers:  IncredibleEmployment.be  This test is no t yet approved or cleared by the [place] FDA and  has been authorized for detection and/or diagnosis of SARS-CoV-2 by FDA under an Emergency Use Authorization (EUA). This EUA will remain  in effect (meaning this test can be used) for the duration of the COVID-19 declaration under Section 564(b)(1) of the Act, 21 U.S.C.section 360bbb-3(b)(1), unless the authorization is terminated  or revoked sooner.       Influenza A by PCR NEGATIVE NEGATIVE Final   Influenza B by PCR NEGATIVE NEGATIVE Final    Comment: (NOTE) The Xpert Xpress SARS-CoV-2/FLU/RSV plus assay is intended as an aid in the diagnosis of influenza from Nasopharyngeal swab specimens and should not be used as a sole basis for treatment. Nasal washings and aspirates are unacceptable for Xpert Xpress SARS-CoV-2/FLU/RSV testing.  Fact Sheet for  Patients: EntrepreneurPulse.com.au  Fact Sheet for Healthcare Providers: IncredibleEmployment.be  This test is not yet approved or cleared by the [place] FDA and has been authorized for detection and/or diagnosis of SARS-CoV-2 by FDA under an Emergency Use Authorization (EUA). This EUA will remain in effect (meaning this test can  be used) for the duration of the COVID-19 declaration under Section 564(b)(1) of the Act, 21 U.S.C. section 360bbb-3(b)(1), unless the authorization is terminated or revoked.  Performed at [facility], [address] [place], [name] [address]      Labs: BNP (last 3 results) No results for input(s): BNP in the last 8760 hours. Basic Metabolic Panel: Recent Labs  Lab 09/02/20 1840 09/03/20 0547  NA 136  --   K 3.7  --   CL 99  --   CO2 29  --   GLUCOSE 220*  --   BUN 11  --   CREATININE 1.04* 1.04*  CALCIUM 9.0  --    Liver Function Tests: Recent Labs  Lab 09/02/20 1840  AST 13*  ALT 14  ALKPHOS 78  BILITOT 0.1*  PROT 6.9  ALBUMIN 3.4*   No results for input(s): LIPASE, AMYLASE in the last 168 hours. No results for input(s): AMMONIA in the last 168 hours. CBC: Recent Labs  Lab 09/02/20 1840 09/03/20 0547  WBC 10.4 10.7*  HGB 11.8* 11.9*  HCT 35.1* 36.7  MCV 89.3 90.6  PLT 432* 421*   Cardiac Enzymes: No results for input(s): CKTOTAL, CKMB, CKMBINDEX, TROPONINI in the last 168 hours. BNP: Invalid input(s): POCBNP CBG: Recent Labs  Lab 09/06/20 0609 09/06/20 2122 09/07/20 0445 09/07/20 0619 09/07/20 1214  GLUCAP 288* 208* 166* 175* 242*   D-Dimer No results for input(s): DDIMER in the last 72 hours. Hgb A1c No results for input(s): HGBA1C in the last 72 hours. Lipid Profile No results for input(s): CHOL, HDL, LDLCALC, TRIG, CHOLHDL, LDLDIRECT in the last 72 hours. Thyroid function studies No results for input(s): TSH, T4TOTAL, T3FREE, THYROIDAB in the last 72  hours.  Invalid input(s): FREET3 Anemia work up No results for input(s): VITAMINB12, FOLATE, FERRITIN, TIBC, IRON, RETICCTPCT in the last 72 hours. Urinalysis    Component Value Date/Time   COLORURINE YELLOW 09/02/2020 2104   APPEARANCEUR CLEAR 09/02/2020 2104   LABSPEC 1.010 09/02/2020 2104   PHURINE 6.0 09/02/2020 2104   GLUCOSEU 250 (A) 09/02/2020 2104   HGBUR LARGE (A) 09/02/2020 2104   BILIRUBINUR NEGATIVE 09/02/2020 2104   KETONESUR NEGATIVE 09/02/2020 2104   PROTEINUR NEGATIVE 09/02/2020 2104   UROBILINOGEN 0.2 07/14/2013 0746   NITRITE NEGATIVE 09/02/2020 2104   LEUKOCYTESUR NEGATIVE 09/02/2020 2104   Sepsis Labs Invalid input(s): PROCALCITONIN,  WBC,  LACTICIDVEN Microbiology Recent Results (from the past 240 hour(s))  Resp Panel by RT-PCR (Flu A&B, Covid) Nasopharyngeal Swab     Status: None   Collection Time: 09/02/20 11:07 PM   Specimen: Nasopharyngeal Swab; Nasopharyngeal(NP) swabs in vial transport medium  Result Value Ref Range Status   SARS Coronavirus 2 by RT PCR NEGATIVE NEGATIVE Final    Comment: (NOTE) SARS-CoV-2 target nucleic acids are NOT DETECTED.  The SARS-CoV-2 RNA is generally detectable in upper respiratory specimens during the acute phase of infection. The lowest concentration of SARS-CoV-2 viral copies this assay can detect is 138 copies/mL. A negative result does not preclude SARS-Cov-2 infection and should not be used as the sole basis for treatment or other patient management decisions. A negative result may occur with  improper specimen collection/handling, submission of specimen other than nasopharyngeal swab, presence of viral mutation(s) within the areas targeted by this assay, and inadequate number of viral copies(<138 copies/mL). A negative result must be combined with clinical observations, patient history, and epidemiological information. The expected result is Negative.  Fact Sheet for Patients:  EntrepreneurPulse.com.au  Fact Sheet for Healthcare Providers:  IncredibleEmployment.be  This test is no t yet approved or cleared by the [place] FDA and  has been authorized for detection and/or diagnosis of SARS-CoV-2 by FDA under an Emergency Use Authorization (EUA). This EUA will remain  in effect (meaning this test can be used) for the duration of the COVID-19 declaration under Section 564(b)(1) of the Act, 21 U.S.C.section 360bbb-3(b)(1), unless the authorization is terminated  or revoked sooner.       Influenza A by PCR NEGATIVE NEGATIVE Final   Influenza B by PCR NEGATIVE NEGATIVE Final    Comment: (NOTE) The Xpert Xpress SARS-CoV-2/FLU/RSV plus assay is intended as an aid in the diagnosis of influenza from Nasopharyngeal swab specimens and should not be used as a sole basis for treatment. Nasal washings and aspirates are unacceptable for Xpert Xpress SARS-CoV-2/FLU/RSV testing.  Fact Sheet for Patients: EntrepreneurPulse.com.au  Fact Sheet for Healthcare Providers: IncredibleEmployment.be  This test is not yet approved or cleared by the [place] FDA and has been authorized for detection and/or diagnosis of SARS-CoV-2 by FDA under an Emergency Use Authorization (EUA). This EUA will remain in effect (meaning this test can be used) for the duration of the COVID-19 declaration under Section 564(b)(1) of the Act, 21 U.S.C. section 360bbb-3(b)(1), unless the authorization is terminated or revoked.  Performed at [facility], [address] [place], [place] [address]      Time coordinating discharge: 25 minutes   30 Day Unplanned Readmission Risk Score    Flowsheet Row ED to Hosp-Admission (Current) from 09/02/2020 in [facility]  30 Day Unplanned Readmission Risk Score (%) 19.12 Filed at 09/07/2020 1200       This score is the patient's risk of an  unplanned readmission within 30 days of being discharged (0 -100%). The score is based on dignosis, age, lab data, medications, orders, and past utilization.   Low:  0-14.9   Medium: 15-21.9   High: 22-29.9   Extreme: 30 and above            SIGNED:   Edwin Dada, MD  [facility] 09/07/2020, 1:25 PM

## 2020-09-09 ENCOUNTER — Ambulatory Visit: Payer: Medicaid Other

## 2020-09-09 ENCOUNTER — Other Ambulatory Visit: Payer: Self-pay

## 2020-09-09 ENCOUNTER — Ambulatory Visit: Payer: Medicaid Other | Admitting: Occupational Therapy

## 2020-09-09 ENCOUNTER — Ambulatory Visit: Payer: Medicaid Other | Attending: Neurology

## 2020-09-09 DIAGNOSIS — R41841 Cognitive communication deficit: Secondary | ICD-10-CM | POA: Insufficient documentation

## 2020-09-09 DIAGNOSIS — R278 Other lack of coordination: Secondary | ICD-10-CM | POA: Insufficient documentation

## 2020-09-09 DIAGNOSIS — R2689 Other abnormalities of gait and mobility: Secondary | ICD-10-CM | POA: Insufficient documentation

## 2020-09-09 DIAGNOSIS — R41840 Attention and concentration deficit: Secondary | ICD-10-CM | POA: Diagnosis present

## 2020-09-09 DIAGNOSIS — R2681 Unsteadiness on feet: Secondary | ICD-10-CM | POA: Insufficient documentation

## 2020-09-09 DIAGNOSIS — R296 Repeated falls: Secondary | ICD-10-CM | POA: Diagnosis present

## 2020-09-09 DIAGNOSIS — M6281 Muscle weakness (generalized): Secondary | ICD-10-CM | POA: Insufficient documentation

## 2020-09-09 NOTE — Therapy (Addendum)
[facility] [address] [place], [name], [address] Phone: [phone]   Fax:  [phone]  Speech Language Pathology Evaluation  Patient Details  Name: Tracey Perkins MRN: [id number] Date of Birth: [birth year] Referring Provider (SLP): Dr. Loleta Books (referring); Dr. Joyce Copa (documentation)   Encounter Date: 09/09/2020   End of Session - 09/09/20 1711     Visit Number 1    Number of Visits 4    Date for SLP Re-Evaluation 10/24/20   every other week   Authorization Type Medicaid -requested    SLP Start Time 1318    SLP Stop Time  31    SLP Time Calculation (min) 47 min    Activity Tolerance Patient tolerated treatment well             Past Medical History:  Diagnosis Date   Asthma    Brain tumor ([place])    Diabetes mellitus without complication ([place])    Eczema    Food allergy    Gastroparesis    Hypokalemia    Hypomagnesemia    Hypothyroid    Neuropathy    Post concussion syndrome 05/2019   Thyroid disease    Vision changes     Past Surgical History:  Procedure Laterality Date   BRAIN SURGERY     Portacath placement and removed     WISDOM TOOTH EXTRACTION      There were no vitals filed for this visit.   Subjective Assessment - 09/09/20 1447     Subjective "I was going to get my Master's Degree before the concussion. Now, I don't know." "After the concussion I feel like my processing is worse, and I get overwhelmed very easily with a lot of motion."    Patient is accompained by: Family member   mother   Currently in Pain? No/denies                SLP Evaluation [facility] - 09/09/20 1443       SLP Visit Information   SLP Received On 09/09/20    Referring Provider (SLP) Dr. Loleta Books (referring); Dr. Joyce Copa (documentation)    Onset Date 09-02-20    Medical Diagnosis CVA (small lt cerebral peduncle infarct)      Subjective   Subjective pt with hx of attention deficit and processing  deficit - dx of MCI is in clinical medical hx    Patient/Family Stated Goal "I know my balance is messed up but I'm really concerned about my thinking too."      General Information   HPI 35yo female with long medical history admitted 09/02/20 due to gait difficulties. PMH:  asthma, eczema, food allergy, polyneuropathy, uncontrolled type 1 diabetes, gastroparesis,  L thalamic infarct, R thalamic infarct, lymphocytic thyroiditis, s/p radiation therapy, chemo and 2 surgical resection for medulloblastoma, head injury in May 2021 with residual nystagmus, visual field defect, bilateral sensorineural hearing loss. MRI 09-03-20 new left cerebral peduncle infarct.    Mobility Status arrived in wheelchair      Balance Screen   Has the patient fallen in the past 6 months Yes    How many times? 3    Has the patient had a decrease in activity level because of a fear of falling?  Yes      Prior Functional Status   Cognitive/Linguistic Baseline Baseline deficits    Baseline deficit details difficulty with memory, mild difficulty with processing, and deficit with attention PTA    Type of Home  House     Lives With [employer] --   has some credits for Masters in [employer] Unemployed      Cognition   Overall Cognitive Status History of cognitive impairments - at baseline   however pt and mother agree processing and attention are worse - pt now gets overwhelmed with a task if incr'd visual activity is present   Area of Impairment Problem solving;Memory;Attention    Attention Comments Pt acknowledged attention deficits PTA but mother and pt endorsed these appear worse after the concussion in May 2021. Pt held conversation rather well without tangential conversation today; she did appear to have some disorganzed explanation. Pt reports after CVA she now gets "overwhelmed" when having to selectively attend to a task in mod noisy environment.    Memory Comments Pt and mother both state  memory is worse after concussion May 2021.    Problem Solving Comments Pt with decr'd processing speed - appears more cognitively based than language-based, as she could not process how to perform the last task (a simple shape modification task) on Self Adminsitered Gerocognitive Eval (SAGE). This 12-step evaluation took pt over 25 minutes to complete.    Behaviors Restless      Auditory Comprehension   Overall Auditory Comprehension Appears within functional limits for tasks assessed      Verbal Expression   Overall Verbal Expression Appears within functional limits for tasks assessed      Motor Speech   Overall Motor Speech Appears within functional limits for tasks assessed                             SLP Education - 09/09/20 1709     Education Details evaluation results, do 3 visits ST and the rest PT, Constant Therapy app    Person(s) Educated Patient;Parent(s)    Methods Explanation    Comprehension Verbalized understanding;Need further instruction                SLP Long Term Goals - 09/09/20 1722       SLP LONG TERM GOAL #1   Title pt will use 3 memory enhancing techniques between 2 sessions    Time 3    Period --   visits, for all LTGs   Status New    Target Date 10/23/20      SLP LONG TERM GOAL #2   Title pt will demo knowledge about when to incorporate compensations for attention between 2 sessions    Time 3    Period --   visits   Status New    Target Date 10/23/20      SLP LONG TERM GOAL #3   Title pt will demo knowledge about how to compensate for reduced processing skills (ask for repeat, double check her work, etc) with rare min A in 3 sessions    Time 3    Period --   visits   Status New    Target Date 10/23/20      SLP LONG TERM GOAL #4   Title pt will demo how to access cognitive activity apps on her phone 10/10 reps    Time 3    Period --   visits   Status New              Plan - 09/09/20 1717     Clinical  Impression Statement Tracey Perkins presents today with severe  cognitive communication deficits in areas of attention, problem solving/processing, and memory, however practically deficits appear more moderate in nature. She scored 13/22 on the Self-Administered Gerocognitie Evaluation (SAGE) which is just below WNL for the MCI group norms, and > 3 standard deviations below the WNL norms. Mother and pt tell SLP that these areas were all difficulties PTA however now are exacerbated after concussion in May 2021 and now with [facility] admission. Pt would benefit from short course of skilled ST targeting compensations and accessing apps to assist with improvement of these cognitive skills, as pt would like to focus the majority of her therapy at this time with balance and mobility.    Speech Therapy Frequency 1x /week    Duration --   one or once every other week   Treatment/Interventions Compensatory techniques;Functional tasks;Cognitive reorganization;Internal/external aids;Patient/family education;SLP instruction and feedback    Potential to Achieve Goals Fair    Potential Considerations Previous level of function    SLP Home Exercise Plan Constant Therapy app was discussed today    Consulted and Agree with Plan of Care Patient;Family member/caregiver    Family Member Consulted mother             Patient will benefit from skilled therapeutic intervention in order to improve the following deficits and impairments:   Cognitive communication deficit  Managed medicaid CPT codes: [phone] - Cognitive training (First 15 min) and [id number] - Cognitive training (each additional 15 min)   Problem List Patient Active Problem List   Diagnosis Date Noted   Mild cognitive impairment 09/04/2020   Diabetic gastroparesis ([place]) 09/04/2020   Acute CVA (cerebrovascular accident) ([place]) 09/03/2020   Impulsiveness 10/14/2019   Mild episode of recurrent major depressive disorder ([place]) 09/04/2019   Anxiety 08/16/2019    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   Status post radiation therapy 11/22/2018   Seasonal and perennial allergic rhinitis 11/22/2018   Seasonal allergic conjunctivitis 11/22/2018   Acute sinusitis 11/22/2018   Keratosis pilaris 10/05/2017   Melanocytic nevi of trunk 10/05/2017   Adverse food reaction 02/09/2017   Gastroesophageal reflux disease 02/09/2017   Mild intermittent asthma with acute exacerbation 11/19/2015   Mild persistent asthma without complication [id number]   Acute seasonal allergic rhinitis due to pollen 11/16/2015   Anaphylactic shock due to adverse food reaction 11/16/2015   Medulloblastoma ([place]) 11/16/2015   Type 1 diabetes mellitus without complication ([place]) Q000111Q   Dry mouth 11/16/2015   Xerosis cutis 11/16/2015   Nystagmus 08/15/2014   Visual field defect 08/15/2014   Disequilibrium 07/25/2014   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    [facility] ,MS, CCC-SLP  09/09/2020, 5:29 PM  [facility] [address] [place], [name], [address] Phone: [phone]   Fax:  [phone]  Name: Tracey Perkins MRN: [id number] Date of Birth: Mar 27, 1985

## 2020-09-09 NOTE — Therapy (Signed)
[facility] [address] [place], [name], [address] Phone: [phone]   Fax:  [phone]  Occupational Therapy Evaluation  Patient Details  Name: Tracey Perkins MRN: [id number] Date of Birth: 12/27/1985 Referring Provider (OT): [place]   Encounter Date: 09/09/2020   OT End of Session - 09/09/20 1557     Visit Number 1    Number of Visits 1    OT Start Time 2    OT Stop Time 1058    OT Time Calculation (min) 38 min             Past Medical History:  Diagnosis Date   Asthma    Brain tumor ([place])    Diabetes mellitus without complication ([place])    Eczema    Food allergy    Gastroparesis    Hypokalemia    Hypomagnesemia    Hypothyroid    Neuropathy    Post concussion syndrome 05/2019   Thyroid disease    Vision changes     Past Surgical History:  Procedure Laterality Date   BRAIN SURGERY     Portacath placement and removed     WISDOM TOOTH EXTRACTION      There were no vitals filed for this visit.   Subjective Assessment - 09/09/20 1029     Patient Stated Goals get arm stronger    Currently in Pain? No/denies               [facility] OT Assessment - 09/09/20 1310       Assessment   Medical Diagnosis CVA    Referring Provider (OT) Dr. Loleta Books [name]   Onset Date/Surgical Date 09/02/20    Hand Dominance Right    Prior Therapy acute and inpatient rehab at [facility]; OPPT services at this location      Precautions   Precautions Fall    Precaution Comments asthma, eczema, food allergy, polyneuropathy, uncontrolled type 1 diabetes, gastroparesis, h/o chronic L thalamic infarct, new R thalamic infarct since 2016, lymphocytic thyroiditis, s/p radiation therapy, chemo and 2 surgical resection for medulloblastoma, childhood, nystagmus, visual field defect, sensorineural hearing loss of bilat ears      Balance Screen   Has the patient fallen in the past 6 months Yes    How many times? 3   maybe  more   Has the patient had a decrease in activity level because of a fear of falling?  Yes    Is the patient reluctant to leave their home because of a fear of falling?  Yes      Home  Environment   Family/patient expects to be discharged to: Private residence    Living Arrangements Parent      Prior Function   Level of Independence Independent    Vocation Student    Vocation Requirements Was in [employer], looking more toward finding a job    Leisure Writing; Crafts      ADL   Eating/Feeding Set up    Makena up    Upper Body Bathing Set up    Superior    Lower Body Dressing Minimal assistance    Risk analyst Minimal assistance      Mobility   Mobility Status Needs assist      Written Expression   Dominant Hand Right    Handwriting 100% legible      Vision Assessment   Vision Assessment Vision not tested  Comment diplopia since childhood      Cognition   Overall Cognitive Status Impaired/Different from baseline    Area of Impairment Safety/judgement;Attention;Awareness;Problem solving    Safety/Judgement Decreased awareness of safety;Decreased awareness of deficits    Awareness Intellectual    Problem Solving Slow processing;Decreased initiation;Difficulty sequencing    Attention Focused    Focused Attention Impaired    Problem Solving Impaired    Behaviors Impulsive;Restless;Poor frustration tolerance      Sensation   Light Touch Appears Intact      Coordination   9 Hole Peg Test Right;Left    Right 9 Hole Peg Test 37.44    Left 9 Hole Peg Test 63      ROM / Strength   AROM / PROM / Strength AROM;Strength      AROM   Overall AROM  Deficits    Overall AROM Comments bilateral shoulder flexion is grossly 120, and pt demonstrates decreased bilateral supination.      Strength   Overall Strength Deficits    Overall Strength Comments RUE proximal grossly #=/5, biceps  4-/5, triceps, 4/5, LUE grossly 4/5      Hand Function   Right Hand Grip (lbs) 30.8    Left Hand Grip (lbs) 43.8                             OT Education - 09/09/20 1555     Education Details red putty for grip strength, coordination activities for fine motor coordination- see pt instructions    Person(s) Educated Patient;Parent(s)    Methods Explanation;Verbal cues;Handout    Comprehension Verbalized understanding;Returned demonstration                        Plan - 09/09/20 1558     Clinical Impression Statement 35 y/o female admitted 8/17 secondary to unsteadiness. Found to have L cerebral peduncle infarcts and small silent bilateral chronic infarcts. PMH includes type 1 DM, asthma, head injury with resulting nystagmus, brain tumor s/p surgery 1999.Pt presents to occupational therapy with the following deficits: cogntive deficits, deceased balnce, decreased strength, decreased coordiantion, visual deficits which impede performance of daily activities. Due to p's insurance limitations, pt/ mother decided not to pursue OT at this time. Pt was provided with a simple HEP for grip and coordination.    OT Occupational Profile and History Problem Focused Assessment - Including review of records relating to presenting problem    Occupational performance deficits (Please refer to evaluation for details): ADL's;IADL's;Play;Leisure;Social Participation    Body Structure / Function / Physical Skills ADL;UE functional use;Balance;Flexibility;Vision;FMC;ROM;Gait;GMC;Coordination;IADL;Decreased knowledge of use of DME;Strength;Dexterity;Mobility    Cognitive Skills Attention;Memory;Problem Solve;Safety Awareness;Sequencing;Temperament/Personality;Thought;Understand    Rehab Potential Fair    Clinical Decision Making Several treatment options, min-mod task modification necessary    Comorbidities Affecting Occupational Performance: May have comorbidities impacting  occupational performance    Modification or Assistance to Complete Evaluation  Min-Moderate modification of tasks or assist with assess necessary to complete eval    OT Frequency One time visit    OT Duration 12 weeks    OT Treatment/Interventions Self-care/ADL training;Patient/family education    Plan no further OT visits scheduled at this time per pt request due to insurance limitations. Pt would prefer to work with PT/ ST    Consulted and Agree with Plan of Care Patient;Family member/caregiver    Family Member Consulted mother  Patient will benefit from skilled therapeutic intervention in order to improve the following deficits and impairments:   Body Structure / Function / Physical Skills: ADL, UE functional use, Balance, Flexibility, Vision, FMC, ROM, Gait, GMC, Coordination, IADL, Decreased knowledge of use of DME, Strength, Dexterity, Mobility Cognitive Skills: Attention, Memory, Problem Solve, Safety Awareness, Sequencing, Temperament/Personality, Thought, Understand     Visit Diagnosis: Muscle weakness (generalized) - Plan: Ot plan of care cert/re-cert  Other lack of coordination - Plan: Ot plan of care cert/re-cert  Attention and concentration deficit - Plan: Ot plan of care cert/re-cert    Problem List Patient Active Problem List   Diagnosis Date Noted   Mild cognitive impairment 09/04/2020   Diabetic gastroparesis ([place]) 09/04/2020   Acute CVA (cerebrovascular accident) ([place]) 09/03/2020   Impulsiveness 10/14/2019   Mild episode of recurrent major depressive disorder ([place]) 09/04/2019   Anxiety 08/16/2019   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   Status post radiation therapy 11/22/2018   Seasonal and perennial allergic rhinitis 11/22/2018   Seasonal allergic conjunctivitis 11/22/2018   Acute sinusitis 11/22/2018   Keratosis pilaris 10/05/2017   Melanocytic nevi of trunk 10/05/2017   Adverse food reaction  02/09/2017   Gastroesophageal reflux disease 02/09/2017   Mild intermittent asthma with acute exacerbation 11/19/2015   Mild persistent asthma without complication Q000111Q   Acute seasonal allergic rhinitis due to pollen 11/16/2015   Anaphylactic shock due to adverse food reaction 11/16/2015   Medulloblastoma ([place]) 11/16/2015   Type 1 diabetes mellitus without complication ([place]) Q000111Q   Dry mouth 11/16/2015   Xerosis cutis 11/16/2015   Nystagmus 08/15/2014   Visual field defect 08/15/2014   Disequilibrium 07/25/2014   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    [facility] 09/09/2020, 4:05 PM  [facility] [address] [place], [name], [address] Phone: [phone]   Fax:  [phone]  Name: Tracey Perkins MRN: [id number] Date of Birth: [birth year]

## 2020-09-09 NOTE — Therapy (Addendum)
[facility] [address] [place], [name], [address] Phone: [phone]   Fax:  [phone]  Physical Therapy Evaluation  Patient Details  Name: Tracey Perkins MRN: [id number] Date of Birth: 1985/11/10 Referring Provider (PT): Edwin Dada, MD   Encounter Date: 09/09/2020   PT End of Session - 09/09/20 1100     Visit Number 1    Number of Visits 9    Date for PT Re-Evaluation 11/06/20    Authorization Type Waiting MCD Authorization    PT Start Time 1100    PT Stop Time 1145    PT Time Calculation (min) 45 min    Equipment Utilized During Treatment Gait belt    Activity Tolerance Patient tolerated treatment well    Behavior During Therapy [facility] for tasks assessed/performed             Past Medical History:  Diagnosis Date   Asthma    Brain tumor ([place])    Diabetes mellitus without complication ([place])    Eczema    Food allergy    Gastroparesis    Hypokalemia    Hypomagnesemia    Hypothyroid    Neuropathy    Post concussion syndrome 05/2019   Thyroid disease    Vision changes     Past Surgical History:  Procedure Laterality Date   BRAIN SURGERY     Portacath placement and removed     WISDOM TOOTH EXTRACTION      There were no vitals filed for this visit.    Subjective Assessment - 09/09/20 1104     Subjective Patient reports that she was discharged home on monday. Mom reports that that they are giving her 24 hour supervision and assistance. Reports she has been using the rollator, but is unable to get into the bathroom. No falls. Reports some weakness and numbness on R side. Reports the numbness has mainly gone away in the R leg.    Patient is accompained by: Family member   Mother, [name]   Pertinent History asthma, eczema, food allergy, polyneuropathy, uncontrolled type 1 diabetes, gastroparesis, h/o chronic L thalamic infarct, new R thalamic infarct since 2016, lymphocytic thyroiditis,  s/p radiation therapy, chemo and 2 surgical resection for medulloblastoma, childhood, nystagmus, visual field defect, sensorineural hearing loss of bilat ears    Limitations Standing;Walking    Patient Stated Goals Improving balance and decreasing falls.    Currently in Pain? No/denies                [facility] PT Assessment - 09/09/20 1106       Assessment   Medical Diagnosis CVA    Referring Provider (PT) Edwin Dada, MD    Onset Date/Surgical Date 09/02/20    Hand Dominance Right    Prior Therapy acute and inpatient rehab at [facility]; OPPT services at this location      Precautions   Precautions Fall    Precaution Comments asthma, eczema, food allergy, polyneuropathy, uncontrolled type 1 diabetes, gastroparesis, h/o chronic L thalamic infarct, new R thalamic infarct since 2016, lymphocytic thyroiditis, s/p radiation therapy, chemo and 2 surgical resection for medulloblastoma, childhood, nystagmus, visual field defect, sensorineural hearing loss of bilat ears      Restrictions   Weight Bearing Restrictions No      Balance Screen   Has the patient fallen in the past 6 months Yes    How many times? 5    Has the patient had a decrease in activity  level because of a fear of falling?  Yes    Is the patient reluctant to leave their home because of a fear of falling?  No      Home Social worker Private residence    Living Arrangements Parent    Available Help at Discharge Family    Type of [place] to enter    Entrance Stairs-Number of Steps 5    Entrance Stairs-Rails Walker Two level;Bed/bath upstairs    Alternate Level Stairs-Number of Steps 12    Alternate Level Stairs-Rails Right    [place] - 2 wheels;Tub bench    Additional Comments reports is going sideways up stairs to stay in her bedroom      Prior Function   Level of [place] Requirements  Was in [employer], looking more toward finding a job    Leisure Writing; Nature conservation officer Intact    Additional Comments hc neuropathy in bilat feet, intermittent numbness reported during evaluation      Coordination   Gross Motor Movements are Fluid and Coordinated No    Coordination and Movement Description 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  RLE: 3+/5 hip flexion, 4/5 knee extension and flexion, 4-/5 ankle DF.  LLE: 4/5 hip flexion, 4-/5 knee flexion/extension, 4/5 ankle DF      Bed Mobility   Bed Mobility Rolling Right;Rolling Left;Supine to Sit;Sit to Supine    Rolling Right Independent    Rolling Left Independent    Supine to Sit Independent    Sit to Supine Independent      Transfers   Transfers Sit to Stand;Stand to Sit    Sit to Stand 5: Supervision;4: Min guard    Five time sit to stand comments  14.20 seconds w/ UE support; fatigue noted at end assesment with decreased control with descent    Stand to Sit 5: Supervision;4: Min guard    Stand Pivot Transfers 5: Supervision;4: Min guard    Stand Pivot Transfer Details (indicate cue type and reason) transfer from w/c <> mat without AD.      Ambulation/Gait   Ambulation/Gait Yes    Ambulation/Gait Assistance 5: Supervision    Ambulation/Gait Assistance Details Completd ambulation with rollator x 115 with mild imbalance noted, require PT cues for slowed paced as often increased speed and further challenge to balance. CGA. Completed ambulation with standard RW x 115 ft with improved stability noted with mainly close supervision with this device. Patient unable to use rollator in house due to space constraints. PT educating on need for standard RW at this  time for safety. Mom verbalize agreement.    Ambulation Distance (Feet) 230 Feet    Assistive device Rolling walker;Rollator    Gait Pattern Step-through pattern;Decreased stride length;Trunk flexed;Decreased trunk rotation;Decreased hip/knee flexion - right;Decreased hip/knee flexion - left;Poor foot clearance - left    Ambulation Surface Level;Indoor    Gait velocity 14.24 seconds = 2.3 ft/sec  Objective measurements completed on examination: See above findings.               PT Education - 09/09/20 1207     Education Details Evaluation Findings/POC; Visit Limit    Person(s) Educated Patient;Parent(s)    Methods Explanation    Comprehension Verbalized understanding              PT Short Term Goals - 09/09/20 1216       PT SHORT TERM GOAL #1   Title Patient will undergo further balance assesment with Berg/DGI as applicable and LTG to be set    Baseline TBA    Time 4    Period Weeks    Status New    Target Date 10/09/20      PT SHORT TERM GOAL #2   Title Pt be able to perform HEP with supervision focusing on strength and standing balance    Baseline HEP established at prior POC    Time 4    Period Weeks    Status New      PT SHORT TERM GOAL #3   Title Pt will demonstrate ability to perform stand pivot transfers consistently to L and R with supervision and without AD    Baseline CGA required    Time 4    Period Weeks    Status New               PT Long Term Goals - 09/09/20 1218       PT LONG TERM GOAL #1   Title Patient will demonstrate ability to perform final HEP independently at home for improved strength/balance    Baseline HEP established at prior POC    Time 8    Period Weeks    Status New    Target Date 11/06/20      PT LONG TERM GOAL #2   Title LTG to be set for Berg/DGI as applicable    Baseline TBA    Time 8    Period Weeks    Status New      PT LONG TERM GOAL #3   Title Patient will  improve 5x sit <> stand to </= 12 seconds without UE support    Baseline 14.20 seconds with UE support    Time 8    Period Weeks    Status New      PT LONG TERM GOAL #4   Title Patient will improve gait speed to >/= 2.62 ft/sec to demo improved community ambulator with LRAD    Baseline 2.3 ft/sec    Time 8    Period Weeks    Status New      PT LONG TERM GOAL #5   Title Paitent will be able to ambulate >/= 500 ft with LRAD and supervision to demo improved household mobility    Baseline 115 CGA with RW    Time 8    Period Weeks    Status New                    Plan - 09/09/20 1209     Clinical Impression Statement Patient returns to [facility] services after recent hospilization for CVA on 09/02/20. Patient's PMH significant for the following: asthma, eczema, food allergy, polyneuropathy, uncontrolled type 1 diabetes, gastroparesis, h/o chronic L thalamic infarct, new R thalamic infarct since 2016, lymphocytic thyroiditis, s/p radiation therapy, chemo and 2 surgical resection for medulloblastoma, childhood, nystagmus, visual field defect, sensorineural hearing loss of bilat  ears. Patient presents with the following impairments upon evaluation: impaired coordination, decreased strength, impaired balance, abnormal gait, impaired sensation, and increased risk for falls. Patient is currently ambulating with Rollator in home, but due to space constraints interested in obtaining standard rolling walker. Trialed various AD during evaluation, with improved safety noted with RW at this time. Patient iwll benefit from skilled PT services to address impairments and return to improved independence with functional activities.    Personal Factors and Comorbidities Comorbidity 3+;Past/Current Experience    Comorbidities asthma, eczema, food allergy, polyneuropathy, uncontrolled type 1 diabetes, gastroparesis, h/o chronic L thalamic infarct, new R thalamic infarct since 2016, lymphocytic thyroiditis, s/p  radiation therapy, chemo and 2 surgical resection for medulloblastoma, childhood, nystagmus, visual field defect, sensorineural hearing loss of bilat ears    Examination-Activity Limitations Bathing;Bend;Dressing;Locomotion Level;Stairs;Stand;Reach Overhead;Transfers    Examination-Participation Restrictions Community Activity;School    Stability/Clinical Decision Making Evolving/Moderate complexity    Clinical Decision Making Moderate    Rehab Potential Good    PT Frequency 1x / week    PT Duration 8 weeks    PT Treatment/Interventions ADLs/Self Care Home Management;Aquatic Therapy;Canalith Repostioning;DME Instruction;Gait training;Stair training;Functional mobility training;Patient/family education;Therapeutic activities;Orthotic Fit/Training;Therapeutic exercise;Balance training;Neuromuscular re-education;Vestibular    PT Next Visit Plan Assess Berg/FGA and update goals. Review/update HEP. Did we get standard RW?    Consulted and Agree with Plan of Care Patient;Family member/caregiver    Family Member Consulted Mother             Patient will benefit from skilled therapeutic intervention in order to improve the following deficits and impairments:  Abnormal gait, Decreased coordination, Difficulty walking, Dizziness, Decreased activity tolerance, Decreased balance, Decreased strength, Impaired sensation, Pain  Visit Diagnosis: Unsteadiness on feet  Other abnormalities of gait and mobility  Muscle weakness (generalized)  Repeated falls     Problem List Patient Active Problem List   Diagnosis Date Noted   Mild cognitive impairment 09/04/2020   Diabetic gastroparesis ([place]) 09/04/2020   Acute CVA (cerebrovascular accident) ([place]) 09/03/2020   Impulsiveness 10/14/2019   Mild episode of recurrent major depressive disorder ([place]) 09/04/2019   Anxiety 08/16/2019   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   Status post radiation therapy  11/22/2018   Seasonal and perennial allergic rhinitis 11/22/2018   Seasonal allergic conjunctivitis 11/22/2018   Acute sinusitis 11/22/2018   Keratosis pilaris 10/05/2017   Melanocytic nevi of trunk 10/05/2017   Adverse food reaction 02/09/2017   Gastroesophageal reflux disease 02/09/2017   Mild intermittent asthma with acute exacerbation 11/19/2015   Mild persistent asthma without complication Q000111Q   Acute seasonal allergic rhinitis due to pollen 11/16/2015   Anaphylactic shock due to adverse food reaction 11/16/2015   Medulloblastoma ([place]) 11/16/2015   Type 1 diabetes mellitus without complication ([place]) [id number]   Dry mouth 11/16/2015   Xerosis cutis 11/16/2015   Nystagmus 08/15/2014   Visual field defect 08/15/2014   Disequilibrium 07/25/2014   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    Managed medicaid CPT codes: 97110- Therapeutic Exercise, 97112- Neuro Re-education, [phone] - Gait Training, 97140 - Manual Therapy, 97530 - Therapeutic Activities, [id number] - [place], [address] - Electrical stimulation (unattended), [id number] - Electrical stimulation (Manual), [id number] - Iontophoresis, and [id number] - [place], PT, DPT 09/09/2020, 12:26 PM  [facility] [address] [place], [name], [address] Phone: [phone]   Fax:  [phone]  Name: JERMIYA LAZZARA MRN: [id number]  Date of Birth: 06/07/1985

## 2020-09-09 NOTE — Patient Instructions (Signed)
1. Grip Strengthening (Resistive Putty)- squeeze with right hand   Squeeze putty using thumb and all fingers. Repeat _20___ times. Do __2__ sessions per day.   2. Roll putty into tube on table and pinch between each finger and thumb x 10 reps each. (can do ring and small finger together)     Copyright  VHI. All rights reserved.     Flip playing cards and pick up and stack coins with left hand

## 2020-09-09 NOTE — Patient Instructions (Signed)
[employer] app Free for 14 days - then subscription  [id number] for 15% off monthly subscription

## 2020-09-15 ENCOUNTER — Ambulatory Visit: Payer: Medicaid Other | Admitting: Physical Therapy

## 2020-09-15 ENCOUNTER — Ambulatory Visit: Payer: Medicaid Other

## 2020-09-15 ENCOUNTER — Other Ambulatory Visit: Payer: Self-pay

## 2020-09-15 DIAGNOSIS — R41841 Cognitive communication deficit: Secondary | ICD-10-CM

## 2020-09-15 DIAGNOSIS — R278 Other lack of coordination: Secondary | ICD-10-CM

## 2020-09-15 DIAGNOSIS — R2681 Unsteadiness on feet: Secondary | ICD-10-CM | POA: Diagnosis not present

## 2020-09-15 DIAGNOSIS — M6281 Muscle weakness (generalized): Secondary | ICD-10-CM

## 2020-09-15 DIAGNOSIS — R296 Repeated falls: Secondary | ICD-10-CM

## 2020-09-15 DIAGNOSIS — R2689 Other abnormalities of gait and mobility: Secondary | ICD-10-CM

## 2020-09-15 NOTE — Therapy (Signed)
[facility] [address] [place], [name], [address] Phone: [phone]   Fax:  [phone]  Physical Therapy Treatment  Patient Details  Name: Tracey Perkins MRN: [id number] Date of Birth: 11/08/1985 Referring Provider (PT): Edwin Dada, MD   Encounter Date: 09/15/2020   PT End of Session - 09/15/20 1113     Visit Number 2    Number of Visits 9    Date for PT Re-Evaluation 11/06/20    Authorization Type Waiting MCD Authorization    PT Start Time 1110    PT Stop Time 1150    PT Time Calculation (min) 40 min    Activity Tolerance Patient tolerated treatment well    Behavior During Therapy [facility] for tasks assessed/performed             Past Medical History:  Diagnosis Date   Asthma    Brain tumor ([place])    Diabetes mellitus without complication ([place])    Eczema    Food allergy    Gastroparesis    Hypokalemia    Hypomagnesemia    Hypothyroid    Neuropathy    Post concussion syndrome 05/2019   Thyroid disease    Vision changes     Past Surgical History:  Procedure Laterality Date   BRAIN SURGERY     Portacath placement and removed     WISDOM TOOTH EXTRACTION      There were no vitals filed for this visit.   Subjective Assessment - 09/15/20 1114     Subjective Ambulating with rollator.  Mother reports many of the CVA symptoms are resolving.  No further dizziness, speech is improving, numbness in her foot is improving.  Has to have help getting into and out of tub again.  Ordered small RW, should be delivered today    Patient is accompained by: Family member   Mother, [name]   Pertinent History asthma, eczema, food allergy, polyneuropathy, uncontrolled type 1 diabetes, gastroparesis, h/o chronic L thalamic infarct, new R thalamic infarct since 2016, lymphocytic thyroiditis, s/p radiation therapy, chemo and 2 surgical resection for medulloblastoma, childhood, nystagmus, visual field defect,  sensorineural hearing loss of bilat ears    Limitations Standing;Walking    Patient Stated Goals Improving balance and decreasing falls.    Currently in Pain? No/denies                [facility] PT Assessment - 09/15/20 1116       Standardized Balance Assessment   Standardized Balance Assessment Berg Balance Test;Dynamic Gait Index      Berg Balance Test   Sit to Stand Able to stand without using hands and stabilize independently    Standing Unsupported Able to stand 2 minutes with supervision    Sitting with Back Unsupported but Feet Supported on Floor or Stool Able to sit safely and securely 2 minutes    Stand to Sit Sits safely with minimal use of hands    Transfers Able to transfer safely, minor use of hands    Standing Unsupported with Eyes Closed Able to stand 10 seconds with supervision    Standing Unsupported with Feet Together Needs help to attain position but able to stand for 30 seconds with feet together    From Standing, Reach Forward with Outstretched Arm Can reach confidently >25 cm (10")    From Standing Position, Pick up Object from Floor Able to pick up shoe, needs supervision    From Standing Position, Turn to Look  Behind Over each Shoulder Needs supervision when turning    Turn 360 Degrees Needs close supervision or verbal cueing    Standing Unsupported, Alternately Place Feet on Step/Stool Able to complete >2 steps/needs minimal assist    Standing Unsupported, One Foot in [place] to take small step independently and hold 30 seconds    Standing on One Leg Tries to lift leg/unable to hold 3 seconds but remains standing independently    Total Score 36    Berg comment: 36/56 high falls risk; difficulty with transitions      Dynamic Gait Index   Level Surface Moderate Impairment    Change in Gait Speed Moderate Impairment    Gait with Horizontal Head Turns Severe Impairment    Gait with Vertical Head Turns Moderate Impairment    Gait and Pivot Turn Moderate  Impairment    Step Over Obstacle Severe Impairment    Step Around Obstacles Mild Impairment    Steps Mild Impairment    Total Score 8    DGI comment: 8/24           Performed DGI without rollator but with therapist providing min-mod A with gait belt for safety.    PT Education - 09/15/20 1430     Education Details results of BERG and DGI; high falls risk and recommendation to continue to use the rollator or RW inside    Person(s) Educated Patient;Parent(s)    Methods Explanation    Comprehension Verbalized understanding              PT Short Term Goals - 09/15/20 1426       PT SHORT TERM GOAL #1   Title Patient will undergo further balance assesment with Berg/DGI as applicable and LTG to be set    Baseline assessed today 8/30    Time 4    Period Weeks    Status Achieved    Target Date 10/09/20      PT SHORT TERM GOAL #2   Title Pt be able to perform HEP with supervision focusing on strength and standing balance    Baseline HEP established at prior POC    Time 4    Period Weeks    Status New      PT SHORT TERM GOAL #3   Title Pt will demonstrate ability to perform stand pivot transfers consistently to L and R with supervision and without AD    Baseline CGA required    Time 4    Period Weeks    Status New               PT Long Term Goals - 09/15/20 1426       PT LONG TERM GOAL #1   Title Patient will demonstrate ability to perform final HEP independently at home for improved strength/balance    Baseline HEP established at prior POC    Time 8    Period Weeks    Status New    Target Date 11/06/20      PT LONG TERM GOAL #2   Title Pt will improve BERG balance score to >/= 40/56 and will improve DGI to >/= 16/24 to indicate decreased falls risk    Baseline BERG: 36/56; DGI: 8/24 without rollator    Time 8    Period Weeks    Status Revised    Target Date 11/06/20      PT LONG TERM GOAL #3   Title Patient will improve 5x sit <> stand  to </= 12  seconds without UE support    Baseline 14.20 seconds with UE support    Time 8    Period Weeks    Status New    Target Date 11/06/20      PT LONG TERM GOAL #4   Title Patient will improve gait speed to >/= 2.62 ft/sec to demo improved community ambulator with LRAD    Baseline 2.3 ft/sec    Time 8    Period Weeks    Status New    Target Date 11/06/20      PT LONG TERM GOAL #5   Title Paitent will be able to ambulate >/= 500 ft with LRAD and supervision to demo improved household mobility    Baseline 115 CGA with RW    Time 8    Period Weeks    Status New    Target Date 11/06/20                   Plan - 09/15/20 1427     Clinical Impression Statement Performed assessment of falls risk and balance impairments with BERG and DGI.  Compared to scores in June, pt has experienced a significant decline in balance and increase in falls risk.  Pt has greatest difficulty with transitions and LOB to the R.  Recommending pt continue to use rollator or RW at home due to high falls risk.    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, childhood, nystagmus, visual field defect, sensorineural hearing loss of bilat ears    Examination-Activity Limitations Bathing;Bend;Dressing;Locomotion Level;Stairs;Stand;Reach Overhead;Transfers    Examination-Participation Restrictions Community Activity;School    Stability/Clinical Decision Making Evolving/Moderate complexity    Rehab Potential Good    PT Frequency 1x / week    PT Duration 8 weeks    PT Treatment/Interventions ADLs/Self Care Home Management;Aquatic Therapy;Canalith Repostioning;DME Instruction;Gait training;Stair training;Functional  mobility training;Patient/family education;Therapeutic activities;Orthotic Fit/Training;Therapeutic exercise;Balance training;Neuromuscular re-education;Vestibular    PT Next Visit Plan Review/update HEP. Focus on transitions and controlled weight shifting R<>midline.  Do pool again?    Consulted and Agree with Plan of Care Patient;Family member/caregiver    Family Member Consulted Mother             Patient will benefit from skilled therapeutic intervention in order to improve the following deficits and impairments:  Abnormal gait, Decreased coordination, Difficulty walking, Dizziness, Decreased activity tolerance, Decreased balance, Decreased strength, Impaired sensation, Pain  Visit Diagnosis: Muscle weakness (generalized)  Other lack of coordination  Unsteadiness on feet  Other abnormalities of gait and mobility  Repeated falls     Problem List Patient Active Problem List   Diagnosis Date Noted   Mild cognitive impairment 09/04/2020   Diabetic gastroparesis ([place]) 09/04/2020   Acute CVA (cerebrovascular accident) ([place]) 09/03/2020   Impulsiveness 10/14/2019   Mild episode of recurrent major depressive disorder ([place]) 09/04/2019   Anxiety 08/16/2019   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   Status post radiation therapy 11/22/2018   Seasonal and perennial allergic rhinitis 11/22/2018   Seasonal allergic conjunctivitis 11/22/2018   Acute sinusitis 11/22/2018   Keratosis pilaris 10/05/2017   Melanocytic nevi of trunk 10/05/2017   Adverse food reaction 02/09/2017   Gastroesophageal reflux disease 02/09/2017   Mild intermittent asthma with  acute exacerbation 11/19/2015   Mild persistent asthma without complication Q000111Q   Acute seasonal allergic rhinitis due to pollen 11/16/2015   Anaphylactic shock due to adverse food reaction 11/16/2015   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

## 2020-09-15 NOTE — Therapy (Signed)
[facility] [address] [place], [name], [address] Phone: [phone]   Fax:  [phone]  Speech Language Pathology Treatment  Patient Details  Name: Tracey Perkins MRN: [id number] Date of Birth: 04/30/1985 Referring Provider (SLP): Dr. Loleta Books (referring); Dr. Joyce Copa (documentation)   Encounter Date: 09/15/2020   End of Session - 09/15/20 2335     Visit Number 2    Number of Visits 4    Date for SLP Re-Evaluation 10/24/20   every other week   Authorization Type Medicaid -requested    SLP Start Time 1149    SLP Stop Time  1230    SLP Time Calculation (min) 41 min    Activity Tolerance Patient tolerated treatment well             Past Medical History:  Diagnosis Date   Asthma    Brain tumor ([place])    Diabetes mellitus without complication ([place])    Eczema    Food allergy    Gastroparesis    Hypokalemia    Hypomagnesemia    Hypothyroid    Neuropathy    Post concussion syndrome 05/2019   Thyroid disease    Vision changes     Past Surgical History:  Procedure Laterality Date   BRAIN SURGERY     Portacath placement and removed     WISDOM TOOTH EXTRACTION      There were no vitals filed for this visit.   Subjective Assessment - 09/15/20 2329     Subjective "I was on Ritalin once but it sort of messed me up and so I stopped taking it."    Patient is accompained by: Family member   Mother   Currently in Pain? No/denies                   ADULT SLP TREATMENT - 09/15/20 1220       General Information   Behavior/Cognition Alert;Cooperative;Pleasant mood      Pain Assessment   Pain Assessment No/denies pain      Cognitive-Linquistic Treatment   Treatment focused on Cognition;Patient/family/caregiver education    Skilled Treatment SLP educated pt on difference (pros/cons) between reminders, alarms, and timers. Pt took notes spontaneously in order to tell mother about our  conversation/education when mom joined the session. Tracey Perkins had functional recall with her notes to convey the info correctly.      Assessment / Recommendations / Plan   Plan Continue with current plan of care      Progression Toward Goals   Progression toward goals Progressing toward goals              SLP Education - 09/15/20 2334     Education Details timers, alarms, reminders compare/contrast    Person(s) Educated Patient;Parent(s)    Methods Explanation    Comprehension Verbalized understanding                SLP Long Term Goals - 09/15/20 2336       SLP LONG TERM GOAL #1   Title pt will use 3 memory enhancing techniques between 2 sessions    Time 3    Period --   visits, for all LTGs   Status On-going    Target Date 10/23/20      SLP LONG TERM GOAL #2   Title pt will demo knowledge about when to incorporate compensations for attention between 2 sessions    Time 3    Period --  visits   Status On-going    Target Date 10/23/20      SLP LONG TERM GOAL #3   Title pt will demo knowledge about how to compensate for reduced processing skills (ask for repeat, double check her work, etc) with rare min A in 3 sessions    Time 3    Period --   visits   Status On-going    Target Date 10/23/20      SLP LONG TERM GOAL #4   Title pt will demo how to access cognitive activity apps on her phone 10/10 reps    Time 3    Period --   visits   Status On-going    Target Date 10/23/20              Plan - 09/15/20 2335     Clinical Impression Statement Tracey Perkins presents today with severe cognitive communication deficits in areas of attention, problem solving/processing, and memory, however practically deficits appear more moderate in nature. She scored 13/22 on the Self-Administered Gerocognitie Evaluation (SAGE) which is just below WNL for the MCI group norms, and > 3 standard deviations below the WNL norms. Mother and pt tell SLP that these areas were all  difficulties PTA however now are exacerbated after concussion in May 2021 and now with [place] admission. Today pt used notes spontaneously to recall info provided during session. Pt would benefit from short course of skilled ST targeting compensations and accessing apps to assist with improvement of these cognitive skills, as pt would like to focus the majority of her therapy at this time with balance and mobility.    Speech Therapy Frequency 1x /week    Duration --   one or once every other week   Treatment/Interventions Compensatory techniques;Functional tasks;Cognitive reorganization;Internal/external aids;Patient/family education;SLP instruction and feedback    Potential to Achieve Goals Fair    Potential Considerations Previous level of function    SLP Home Exercise Plan Constant Therapy app was discussed today    Consulted and Agree with Plan of Care Patient;Family member/caregiver    Family Member Consulted mother             Patient will benefit from skilled therapeutic intervention in order to improve the following deficits and impairments:   Cognitive communication deficit    Problem List Patient Active Problem List   Diagnosis Date Noted   Mild cognitive impairment 09/04/2020   Diabetic gastroparesis ([place]) 09/04/2020   Acute CVA (cerebrovascular accident) ([place]) 09/03/2020   Impulsiveness 10/14/2019   Mild episode of recurrent major depressive disorder ([place]) 09/04/2019   Anxiety 08/16/2019   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   Status post radiation therapy 11/22/2018   Seasonal and perennial allergic rhinitis 11/22/2018   Seasonal allergic conjunctivitis 11/22/2018   Acute sinusitis 11/22/2018   Keratosis pilaris 10/05/2017   Melanocytic nevi of trunk 10/05/2017   Adverse food reaction 02/09/2017   Gastroesophageal reflux disease 02/09/2017   Mild intermittent asthma with acute exacerbation 11/19/2015   Mild persistent  asthma without complication [id number]   Acute seasonal allergic rhinitis due to pollen 11/16/2015   Anaphylactic shock due to adverse food reaction 11/16/2015   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

## 2020-09-16 DIAGNOSIS — I639 Cerebral infarction, unspecified: Secondary | ICD-10-CM | POA: Insufficient documentation

## 2020-09-22 ENCOUNTER — Encounter: Payer: Self-pay | Admitting: Family Medicine

## 2020-09-22 ENCOUNTER — Other Ambulatory Visit: Payer: Self-pay

## 2020-09-22 ENCOUNTER — Ambulatory Visit (INDEPENDENT_AMBULATORY_CARE_PROVIDER_SITE_OTHER): Payer: Medicaid Other | Admitting: Family Medicine

## 2020-09-22 VITALS — BP 110/70 | HR 114 | Temp 97.2°F | Resp 14 | Ht 64.0 in | Wt 184.0 lb

## 2020-09-22 DIAGNOSIS — H1010 Acute atopic conjunctivitis, unspecified eye: Secondary | ICD-10-CM

## 2020-09-22 DIAGNOSIS — J3089 Other allergic rhinitis: Secondary | ICD-10-CM

## 2020-09-22 DIAGNOSIS — J4530 Mild persistent asthma, uncomplicated: Secondary | ICD-10-CM

## 2020-09-22 DIAGNOSIS — T781XXS Other adverse food reactions, not elsewhere classified, sequela: Secondary | ICD-10-CM

## 2020-09-22 DIAGNOSIS — J302 Other seasonal allergic rhinitis: Secondary | ICD-10-CM

## 2020-09-22 DIAGNOSIS — K219 Gastro-esophageal reflux disease without esophagitis: Secondary | ICD-10-CM

## 2020-09-22 DIAGNOSIS — E109 Type 1 diabetes mellitus without complications: Secondary | ICD-10-CM

## 2020-09-22 DIAGNOSIS — H1013 Acute atopic conjunctivitis, bilateral: Secondary | ICD-10-CM

## 2020-09-22 MED ORDER — LORATADINE 10 MG PO TABS: 10.0000 mg | ORAL_TABLET | Freq: Every day | ORAL | 5 refills | Status: DC

## 2020-09-22 NOTE — Progress Notes (Signed)
Tracey Perkins [id number] Dept: [phone]  FOLLOW UP NOTE  Patient ID: Tracey Perkins, female    DOB: 1985/12/01  Age: 35 y.o. MRN: [id number] Date of Office Visit: 09/22/2020  Assessment  Chief Complaint: Asthma  HPI [name] is a 35 year old female who presents the clinic for follow-up visit.  She was last seen in this clinic on 12/25/2019 via televisit for evaluation of asthma, allergic rhinitis, allergic conjunctivitis, reflux, and food allergy to banana.  In the interim, she was admitted to the hospital with a CVA on 09/02/2020 and is currently continuing outpatient rehab services for balance and steadiness.  At today's visit she is accompanied by her mother who assists with history.  At today's visit, she reports her asthma has been moderately well controlled with shortness of breath with activity and rare dry cough.  She does report feeling anxiety that she believes stems from blocked nasal passages which results in chest tightness.  She reports that this depends on whether how much time she is spending outside.  She reports that when she cannot breathe through her nasal passages she begins to feel chest tightness at which time she uses Flonase and albuterol with relief of symptoms.  She continues montelukast 10 mg once a day, albuterol about 2 to 3 days a week, and used Flovent for a couple of days since her last visit to this clinic.  Allergic rhinitis is reported as moderately well controlled with symptoms including clear rhinorrhea, occasional nasal congestion resulting in stuffy nose or "blocked nasal passages".  She continues cetirizine, Flonase, and azelastine daily and nasal saline rinses as needed.  Allergic conjunctivitis is reported as moderately well controlled with the use of Systane eyedrops.  Reflux is reported as well controlled with pantoprazole.  She continues to avoid banana with no accidental ingestion or EpiPen use since her last visit to this clinic.   Her last skin testing was on 11/16/2015 and was negative to banana and labs from 11/16/2015 indicate banana IgE 0.10.  Her current medications are listed in the chart. Of note, she is scheduled for an upcoming sleep study due to recent CVA.   Drug Allergies:  Allergies  Allergen Reactions   Banana Other (See Comments)    Mouth burning and swollen   Bactrim [Sulfamethoxazole-Trimethoprim] Hives   Doxycycline Hives   Gentian Violet Other (See Comments)    "It burns."  Per mother   Gentian Violet-Proflavine Sulfate [Triple Dye] Other (See Comments)    Mouth burn   Mold Extract [Trichophyton] Other (See Comments)    Hay Fever   Morphine And Related Hives   Other Other (See Comments) and Hives    "Hay fever, dust and pollen."  Per patient.   Vancomycin Other (See Comments)    Red man syndrome     Physical Exam: BP 110/70   Pulse (!) 114   Temp (!) 97.2 F (36.2 C) (Temporal)   Resp 14   Ht '5\' 4"'$  (1.626 m)   Wt 184 lb (83.5 kg)   SpO2 94%   BMI 31.58 kg/m    Physical Exam Vitals reviewed.  Constitutional:      Appearance: Normal appearance.  HENT:     Head: Normocephalic and atraumatic.     Right Ear: Tympanic membrane normal.     Left Ear: Tympanic membrane normal.     Nose:     Comments: Bilateral nares normal.  Pharynx normal.  Ears normal.  Eyes normal.  Mouth/Throat:     Pharynx: Oropharynx is clear.  Eyes:     Conjunctiva/sclera: Conjunctivae normal.  Cardiovascular:     Rate and Rhythm: Normal rate and regular rhythm.     Heart sounds: Normal heart sounds. No murmur heard. Pulmonary:     Effort: Pulmonary effort is normal.     Breath sounds: Normal breath sounds.     Comments: Lungs clear to auscultation Musculoskeletal:        General: Normal range of motion.     Cervical back: Normal range of motion.  Skin:    General: Skin is warm and dry.  Neurological:     Mental Status: She is alert and oriented to person, place, and time.  Psychiatric:         Mood and Affect: Mood normal.        Behavior: Behavior normal.        Thought Content: Thought content normal.        Judgment: Judgment normal.    Diagnostics: FVC 1.66, FEV1 1.43.  Predicted FVC 3.78, predicted FEV1 3.16.  Spirometry indicates severe restriction.  Patient indicates technical difficulty with the testing procedure.  Assessment and Plan: 1. Mild persistent asthma without complication   2. Adverse food reaction, sequela   3. Seasonal and perennial allergic rhinitis   4. Seasonal allergic conjunctivitis   5. Gastroesophageal reflux disease, unspecified whether esophagitis present   6. Type 1 diabetes mellitus without complication (HCC)     Meds ordered this encounter  Medications   loratadine (CLARITIN) 10 MG tablet    Sig: Take 1 tablet (10 mg total) by mouth daily.    Dispense:  30 tablet    Refill:  5     Patient Instructions  Asthma Continue montelukast  10 mg once a day for coughing or wheezing Continue albuterol (ProAir, red inhaler) 2 puffs every 4 hours if needed for wheezing or coughing spells For asthma flares, begin Flovent 110 (orange inhaler)-2 puffs once a day with a spacer for 1-2 weeks or until you are cough and wheeze free. Rinse your mouth after each use  Allergic rhinitis Begin Claritin 10 mg once a day if needed for runny nose or itchy eyes. This will replace cetirizine for now Continue fluticasone 1 spray per nostril twice a day if needed for stuffy nose. This may help your ears.  In the right nostril, point the applicator out toward the right ear. In the left nostril, point the applicator out toward the left ear Astelin nasal spray 2 sprays in each nostril twice a day as needed for runny nose or sneezing Consider saline nasal rinses as needed for nasal symptoms. Use this before any medicated nasal sprays for best result Continue environmental control of pollens dust mite and mold  Consider allergen immunotherapy if medications are not  effective in controlling your symptoms  Allergic conjunctivitis Continue a daily lubricating eye drop such as Natural Tears or Systane as needed Some over the counter eye drops include Pataday one drop in each eye once a day as needed for red, itchy eyes OR Zaditor one drop in each eye twice a day as needed for red itchy eyes.  Reflux Continue pantoprazole as previously prescribed Follow up with your Gastroenterologist as you normally do  Adverse food reaction Continue to avoid banana. In case of an allergic reaction, take Benadryl 50 mg every 4 hours, and if life-threatening symptoms occur, inject with EpiPen 0.3 mg. Return to the clinic when it is  convenient for you to update her food allergy testing. Remember to stop antihistamines for 3 days before the testing appointment. We will order blood work to help [place] manage your food allergies. We will call you when the results become available.   Continue other medications as noted in your chart.  Call me if this treatment plan is not working well for you  Follow up in the clinic in 6 months or sooner as needed.  Return in about 6 months (around 03/22/2021), or if symptoms worsen or fail to improve.    Thank you for the opportunity to care for this patient.  Please do not hesitate to contact me with questions.  Gareth Morgan, FNP [facility]

## 2020-09-22 NOTE — Patient Instructions (Addendum)
Asthma Continue montelukast  10 mg once a day for coughing or wheezing Continue albuterol (ProAir, red inhaler) 2 puffs every 4 hours if needed for wheezing or coughing spells For asthma flares, begin Flovent 110 (orange inhaler)-2 puffs once a day with a spacer for 1-2 weeks or until you are cough and wheeze free. Rinse your mouth after each use  Allergic rhinitis Begin Claritin 10 mg once a day if needed for runny nose or itchy eyes. This will replace cetirizine for now Continue fluticasone 1 spray per nostril twice a day if needed for stuffy nose. This may help your ears.  In the right nostril, point the applicator out toward the right ear. In the left nostril, point the applicator out toward the left ear Astelin nasal spray 2 sprays in each nostril twice a day as needed for runny nose or sneezing Consider saline nasal rinses as needed for nasal symptoms. Use this before any medicated nasal sprays for best result Continue environmental control of pollens dust mite and mold  Consider allergen immunotherapy if medications are not effective in controlling your symptoms  Allergic conjunctivitis Continue a daily lubricating eye drop such as Natural Tears or Systane as needed Some over the counter eye drops include Pataday one drop in each eye once a day as needed for red, itchy eyes OR Zaditor one drop in each eye twice a day as needed for red itchy eyes.  Reflux Continue pantoprazole as previously prescribed Follow up with your Gastroenterologist as you normally do  Adverse food reaction Continue to avoid banana. In case of an allergic reaction, take Benadryl 50 mg every 4 hours, and if life-threatening symptoms occur, inject with EpiPen 0.3 mg. Return to the clinic when it is convenient for you to update her food allergy testing. Remember to stop antihistamines for 3 days before the testing appointment. We will order blood work to help [place] manage your food allergies. We will call you when the  results become available.   Continue other medications as noted in your chart.  Call me if this treatment plan is not working well for you  Follow up in the clinic in 6 months or sooner as needed.

## 2020-09-23 ENCOUNTER — Ambulatory Visit: Payer: Medicaid Other | Attending: Neurology

## 2020-09-23 DIAGNOSIS — R2689 Other abnormalities of gait and mobility: Secondary | ICD-10-CM | POA: Insufficient documentation

## 2020-09-23 DIAGNOSIS — R278 Other lack of coordination: Secondary | ICD-10-CM | POA: Diagnosis present

## 2020-09-23 DIAGNOSIS — M6281 Muscle weakness (generalized): Secondary | ICD-10-CM | POA: Insufficient documentation

## 2020-09-23 DIAGNOSIS — R296 Repeated falls: Secondary | ICD-10-CM | POA: Insufficient documentation

## 2020-09-23 DIAGNOSIS — R2681 Unsteadiness on feet: Secondary | ICD-10-CM | POA: Insufficient documentation

## 2020-09-23 DIAGNOSIS — R41841 Cognitive communication deficit: Secondary | ICD-10-CM | POA: Diagnosis present

## 2020-09-24 NOTE — Therapy (Signed)
[facility] [address] [place], [name], [address] Phone: [phone]   Fax:  [phone]  Speech Language Pathology Treatment  Patient Details  Name: Tracey Perkins MRN: [id number] Date of Birth: 1985/06/05 Referring Provider (SLP): Dr. Loleta Books (referring); Dr. Joyce Copa (documentation)   Encounter Date: 09/23/2020   End of Session - 09/24/20 0816     Visit Number 3    Number of Visits 4    Date for SLP Re-Evaluation 10/24/20   every other week   Authorization Type Medicaid -requested    SLP Start Time [id number]    SLP Stop Time  [id number]    SLP Time Calculation (min) 42 min    Activity Tolerance Patient tolerated treatment well             Past Medical History:  Diagnosis Date   Asthma    Brain tumor ([place])    Diabetes mellitus without complication ([place])    Eczema    Food allergy    Gastroparesis    Hypokalemia    Hypomagnesemia    Hypothyroid    Neuropathy    Post concussion syndrome 05/2019   Thyroid disease    Vision changes     Past Surgical History:  Procedure Laterality Date   BRAIN SURGERY     Portacath placement and removed     WISDOM TOOTH EXTRACTION      There were no vitals filed for this visit.   Subjective Assessment - 09/23/20 1408     Subjective Pt went down PT hallway, and then into wrong ST office without SLP cues to the correct room.    Currently in Pain? No/denies                   ADULT SLP TREATMENT - 09/24/20 0001       General Information   Behavior/Cognition Alert;Cooperative;Pleasant mood      Cognitive-Linquistic Treatment   Treatment focused on Cognition;Patient/family/caregiver education    Skilled Treatment Pt recalled that she told SLP she would check on a website for hats: "I wanted to tell you when I got home my mom and I went online to try to find that hat company but we couldn't find it." Pt recalled we talked about "settings for memory" last week.  "ANd I'll tell you what I found out about "reminders" is that there isn't an alarm for it so I have to be diligent about looking at my phone. Marland Kitchen..I also set timers for a couple times last week." Pt became labile about being overwhelmed about checking google calendar, email, etc. Pt thought about setting reminder in Google Calendar for each of these things. Pt reported she did talk to her PCP about the atteniton medicine and is considering it. Discussion with mother in the room about how this may help pt's focus somewhat, adn to continue to talk about with her PCP. Pt to try the Google calendar reminders for gmail, mychart, etc so that she does not feel so overwhelmed.      Assessment / Recommendations / Plan   Plan Continue with current plan of care      Progression Toward Goals   Progression toward goals Progressing toward goals              SLP Education - 09/24/20 0812     Education Details Google reminders/calendar for daily tasks can assist pt in not feeling overwhelmed    Person(s) Educated Patient;Parent(s)    Methods  Explanation;Demonstration    Comprehension Returned demonstration;Verbalized understanding;Need further instruction                SLP Long Term Goals - 09/24/20 0817       SLP LONG TERM GOAL #1   Title pt will use 3 memory enhancing techniques between 2 sessions    Baseline 09-23-20    Time 3    Period --   visits, for all LTGs   Status On-going    Target Date 10/23/20      SLP LONG TERM GOAL #2   Title pt will demo knowledge about when to incorporate compensations for attention between 2 sessions    Time 3    Period --   visits   Status On-going    Target Date 10/23/20      SLP LONG TERM GOAL #3   Title pt will demo knowledge about how to compensate for reduced processing skills (ask for repeat, double check her work, etc) with rare min A in 3 sessions    Time 3    Period --   visits   Status On-going    Target Date 10/23/20      SLP LONG TERM  GOAL #4   Title pt will demo how to access cognitive activity apps on her phone 10/10 reps    Time 3    Period --   visits   Status On-going    Target Date 10/23/20              Plan - 09/24/20 0816     Clinical Impression Statement Tracey Perkins presents today with severe cognitive communication deficits in areas of attention, problem solving/processing, and memory, however practically deficits appear more moderate in nature. She scored 13/22 on the Self-Administered Gerocognitie Evaluation (SAGE) which is just below WNL for the MCI group norms, and > 3 standard deviations below the WNL norms. Pt would cont to benefit from short course of skilled ST targeting compensations and accessing apps to assist with improvement of these cognitive skills, as pt would like to focus the majority of her therapy at this time with balance and mobility.    Speech Therapy Frequency 1x /week    Duration --   one or once every other week   Treatment/Interventions Compensatory techniques;Functional tasks;Cognitive reorganization;Internal/external aids;Patient/family education;SLP instruction and feedback    Potential to Achieve Goals Fair    Potential Considerations Previous level of function    SLP Home Exercise Plan Constant Therapy app was discussed today    Consulted and Agree with Plan of Care Patient;Family member/caregiver    Family Member Consulted mother             Patient will benefit from skilled therapeutic intervention in order to improve the following deficits and impairments:   Cognitive communication deficit    Problem List Patient Active Problem List   Diagnosis Date Noted   Mild cognitive impairment 09/04/2020   Diabetic gastroparesis ([place]) 09/04/2020   Acute CVA (cerebrovascular accident) ([place]) 09/03/2020   Impulsiveness 10/14/2019   Mild episode of recurrent major depressive disorder ([place]) 09/04/2019   Anxiety 08/16/2019   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   Status post radiation therapy 11/22/2018   Seasonal and perennial allergic rhinitis 11/22/2018   Seasonal allergic conjunctivitis 11/22/2018   Acute sinusitis 11/22/2018   Keratosis pilaris 10/05/2017   Melanocytic nevi of trunk 10/05/2017   Adverse food reaction 02/09/2017   Gastroesophageal reflux disease  02/09/2017   Mild intermittent asthma with acute exacerbation 11/19/2015   Mild persistent asthma without complication [id number]   Acute seasonal allergic rhinitis due to pollen 11/16/2015   Anaphylactic shock due to adverse food reaction 11/16/2015   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

## 2020-09-25 ENCOUNTER — Ambulatory Visit: Payer: Medicaid Other | Admitting: Physical Therapy

## 2020-09-25 ENCOUNTER — Other Ambulatory Visit: Payer: Self-pay

## 2020-09-25 DIAGNOSIS — R296 Repeated falls: Secondary | ICD-10-CM

## 2020-09-25 DIAGNOSIS — R2681 Unsteadiness on feet: Secondary | ICD-10-CM

## 2020-09-25 DIAGNOSIS — R278 Other lack of coordination: Secondary | ICD-10-CM

## 2020-09-25 DIAGNOSIS — M6281 Muscle weakness (generalized): Secondary | ICD-10-CM

## 2020-09-25 DIAGNOSIS — R41841 Cognitive communication deficit: Secondary | ICD-10-CM | POA: Diagnosis not present

## 2020-09-25 DIAGNOSIS — R2689 Other abnormalities of gait and mobility: Secondary | ICD-10-CM

## 2020-09-25 NOTE — Patient Instructions (Addendum)
Access Code: [email or web address] URL: [email or web address] Date: 09/25/2020 Prepared by: Misty Stanley  Exercises Supine Hamstring Stretch with Strap - 1 x daily - 7 x weekly - 2 sets - 30 second hold Seated Gastroc Stretch with Strap - 1 x daily - 7 x weekly - 2 sets - 30 seconds hold Sit to Stand with Right Leg Back - stand on pillow - 1 x daily - 7 x weekly - 1 sets - 10 reps Standing Single Leg Stance with Unilateral Counter Support - 1 x daily - 7 x weekly - 3 sets - 10 reps Forward Walking with Looking to the RIGHT - 1 x daily - 7 x weekly - 4 sets Romberg Stance with Head Rotation - 1 x daily - 7 x weekly - 1 sets - 10 reps

## 2020-09-25 NOTE — Therapy (Signed)
[facility] [address] [place], [name], [address] Phone: [phone]   Fax:  [phone]  Physical Therapy Treatment  Patient Details  Name: Tracey Perkins MRN: [id number] Date of Birth: Jul 17, 1985 Referring Provider (PT): Edwin Dada, MD   Encounter Date: 09/25/2020   PT End of Session - 09/25/20 1025     Visit Number 3    Number of Visits 9    Date for PT Re-Evaluation 11/06/20    Authorization Type [employer] $4 copay    PT Start Time 1022    PT Stop Time 1105    PT Time Calculation (min) 43 min    Activity Tolerance Patient tolerated treatment well    Behavior During Therapy [facility] for tasks assessed/performed             Past Medical History:  Diagnosis Date   Asthma    Brain tumor ([place])    Diabetes mellitus without complication ([place])    Eczema    Food allergy    Gastroparesis    Hypokalemia    Hypomagnesemia    Hypothyroid    Neuropathy    Post concussion syndrome 05/2019   Thyroid disease    Vision changes     Past Surgical History:  Procedure Laterality Date   BRAIN SURGERY     Portacath placement and removed     WISDOM TOOTH EXTRACTION      There were no vitals filed for this visit.   Subjective Assessment - 09/25/20 1121     Subjective Walking better since last week, not using rollator in the house anymore.  Was at [facility] all day yesterday, trying to figure out what caused the CVA.  Wearing a heart monitor.    Patient is accompained by: Family member   Mother, [name]   Pertinent History asthma, eczema, food allergy, polyneuropathy, uncontrolled type 1 diabetes, gastroparesis, h/o chronic L thalamic infarct, new R thalamic infarct since 2016, lymphocytic thyroiditis, s/p radiation therapy, chemo and 2 surgical resection for medulloblastoma, childhood, nystagmus, visual field defect, sensorineural hearing loss of bilat ears    Limitations Standing;Walking    Patient  Stated Goals Improving balance and decreasing falls.    Currently in Pain? No/denies                [facility] Adult PT Treatment/Exercise - 09/25/20 1123       High Level Balance   High Level Balance Activities Backward walking;Direction changes;Turns;Sudden stops;Head turns;Negotitating around obstacles    High Level Balance Comments Combined with walking around gym without rollator.  Intermittent veering to R when gaze forwards; significant veering to R with head turns to R.  Mild diplopia after performing multiple turns/direction changes      Exercises   Exercises Other Exercises    Other Exercises  Updated and revised HEP as below.  Pt instructed on how to perform safely at home with UE support, table in front or touching wall.            Access Code: [email or web address] URL: [email or web address] Date: 09/25/2020 Prepared by: Misty Stanley  Exercises Supine Hamstring Stretch with Strap - 1 x daily - 7 x weekly - 2 sets - 30 second hold Seated Gastroc Stretch with Strap - 1 x daily - 7 x weekly - 2 sets - 30 seconds hold Sit to Stand with Right Leg Back - stand on pillow - 1 x daily - 7 x weekly - 1 sets - 10 reps  Standing Single Leg Stance with Unilateral Counter Support - 1 x daily - 7 x weekly - 3 sets - 10 reps Forward Walking with Looking to the RIGHT - 1 x daily - 7 x weekly - 4 sets Romberg Stance with Head Rotation - 1 x daily - 7 x weekly - 1 sets - 10 reps        PT Education - 09/25/20 1122     Education Details updated HEP    Person(s) Educated Patient    Methods Explanation;Demonstration;Handout    Comprehension Verbalized understanding;Returned demonstration              PT Short Term Goals - 09/15/20 1426       PT SHORT TERM GOAL #1   Title Patient will undergo further balance assesment with Berg/DGI as applicable and LTG to be set    Baseline assessed today 8/30    Time 4    Period Weeks    Status Achieved    Target Date 10/09/20       PT SHORT TERM GOAL #2   Title Pt be able to perform HEP with supervision focusing on strength and standing balance    Baseline HEP established at prior POC    Time 4    Period Weeks    Status New      PT SHORT TERM GOAL #3   Title Pt will demonstrate ability to perform stand pivot transfers consistently to L and R with supervision and without AD    Baseline CGA required    Time 4    Period Weeks    Status New               PT Long Term Goals - 09/15/20 1426       PT LONG TERM GOAL #1   Title Patient will demonstrate ability to perform final HEP independently at home for improved strength/balance    Baseline HEP established at prior POC    Time 8    Period Weeks    Status New    Target Date 11/06/20      PT LONG TERM GOAL #2   Title Pt will improve BERG balance score to >/= 40/56 and will improve DGI to >/= 16/24 to indicate decreased falls risk    Baseline BERG: 36/56; DGI: 8/24 without rollator    Time 8    Period Weeks    Status Revised    Target Date 11/06/20      PT LONG TERM GOAL #3   Title Patient will improve 5x sit <> stand to </= 12 seconds without UE support    Baseline 14.20 seconds with UE support    Time 8    Period Weeks    Status New    Target Date 11/06/20      PT LONG TERM GOAL #4   Title Patient will improve gait speed to >/= 2.62 ft/sec to demo improved community ambulator with LRAD    Baseline 2.3 ft/sec    Time 8    Period Weeks    Status New    Target Date 11/06/20      PT LONG TERM GOAL #5   Title Paitent will be able to ambulate >/= 500 ft with LRAD and supervision to demo improved household mobility    Baseline 115 CGA with RW    Time 8    Period Weeks    Status New    Target Date 11/06/20  Plan - 09/25/20 1114     Clinical Impression Statement Pt demonstrating improved balance with dynamic gait challenges today but continues to experience increased veering and LOB to R.  Treatment session  focused on revision of HEP to focus on dynamic balance, LE strengthening and righting reactions. Pt tolerated well with no reports of dizziness but requires UE support and close supervision due to LOB.    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, childhood, nystagmus, visual field defect, sensorineural hearing loss of bilat ears    Examination-Activity Limitations Bathing;Bend;Dressing;Locomotion Level;Stairs;Stand;Reach Overhead;Transfers    Examination-Participation Restrictions Community Activity;School    Stability/Clinical Decision Making Evolving/Moderate complexity    Rehab Potential Good    PT Frequency 1x / week    PT Duration 8 weeks    PT Treatment/Interventions ADLs/Self Care Home Management;Aquatic Therapy;Canalith Repostioning;DME Instruction;Gait training;Stair training;Functional mobility training;Patient/family education;Therapeutic activities;Orthotic Fit/Training;Therapeutic exercise;Balance training;Neuromuscular re-education;Vestibular    PT Next Visit Plan How is HEP?  walking with head turns, balance with head turns.  Focus on transitions and controlled weight shifting R<>midline.  Do pool again?    Consulted and Agree with Plan of Care Patient;Family member/caregiver    Family Member Consulted Mother             Patient will benefit from skilled therapeutic intervention in order to improve the following deficits and impairments:  Abnormal gait, Decreased coordination, Difficulty walking, Dizziness, Decreased activity tolerance, Decreased balance, Decreased strength, Impaired sensation, Pain  Visit Diagnosis: Muscle weakness (generalized)  Other lack of  coordination  Unsteadiness on feet  Other abnormalities of gait and mobility  Repeated falls     Problem List Patient Active Problem List   Diagnosis Date Noted   Mild cognitive impairment 09/04/2020   Diabetic gastroparesis ([place]) 09/04/2020   Acute CVA (cerebrovascular accident) ([place]) 09/03/2020   Impulsiveness 10/14/2019   Mild episode of recurrent major depressive disorder ([place]) 09/04/2019   Anxiety 08/16/2019   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   Status post radiation therapy 11/22/2018   Seasonal and perennial allergic rhinitis 11/22/2018   Seasonal allergic conjunctivitis 11/22/2018   Acute sinusitis 11/22/2018   Keratosis pilaris 10/05/2017   Melanocytic nevi of trunk 10/05/2017   Adverse food reaction 02/09/2017   Gastroesophageal reflux disease 02/09/2017   Mild intermittent asthma with acute exacerbation 11/19/2015   Mild persistent asthma without complication [id number]   Acute seasonal allergic rhinitis due to pollen 11/16/2015   Anaphylactic shock due to adverse food reaction 11/16/2015   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 [place], [name], [address] Phone: [phone]   Fax:  [phone]  Name: Tracey Perkins MRN: [id number] Date of Birth: 1985-12-06

## 2020-09-29 ENCOUNTER — Other Ambulatory Visit: Payer: Self-pay

## 2020-09-29 ENCOUNTER — Ambulatory Visit: Payer: Medicaid Other

## 2020-09-29 ENCOUNTER — Ambulatory Visit: Payer: Medicaid Other | Admitting: Physical Therapy

## 2020-09-29 DIAGNOSIS — R2689 Other abnormalities of gait and mobility: Secondary | ICD-10-CM

## 2020-09-29 DIAGNOSIS — R41841 Cognitive communication deficit: Secondary | ICD-10-CM | POA: Diagnosis not present

## 2020-09-29 DIAGNOSIS — M6281 Muscle weakness (generalized): Secondary | ICD-10-CM

## 2020-09-29 DIAGNOSIS — R296 Repeated falls: Secondary | ICD-10-CM

## 2020-09-29 DIAGNOSIS — R278 Other lack of coordination: Secondary | ICD-10-CM

## 2020-09-29 DIAGNOSIS — R2681 Unsteadiness on feet: Secondary | ICD-10-CM

## 2020-09-29 NOTE — Patient Instructions (Signed)
  Constant Therapy - cognitive app (partner15 = 15% off monthly subscription) Talk Path Therapy (free website, but not as good)  [email or web address]://www.foster.info/  You can modify your "morning routine" note in your notes app for the list you might need for your grandparents' house this weekend.  You can put this "morning routine" note into google docs if you want, instead of the notes app  Make a list of the grocery items and the aisle numbers for your frequently-bought items in your google docs, so you don't wander around the store looking for the item  Use the GPS when you need to find a route somewhere, even to the grocery store down the street!

## 2020-09-29 NOTE — Therapy (Signed)
[facility] [address] [place], [name], [address] Phone: [phone]   Fax:  [phone]  Speech Language Pathology Treatment/discharge summary  Patient Details  Name: Tracey Perkins MRN: [id number] Date of Birth: 03-Mar-1985 Referring Provider (SLP): Dr. Loleta Books (referring); Dr. Joyce Copa (documentation)   Encounter Date: 09/29/2020   End of Session - 09/29/20 1544     Visit Number 4    Number of Visits 4    Date for SLP Re-Evaluation 10/24/20   every other week   Authorization Type Medicaid -requested    SLP Start Time 1534    SLP Stop Time  75    SLP Time Calculation (min) 41 min    Activity Tolerance Patient tolerated treatment well             Past Medical History:  Diagnosis Date   Asthma    Brain tumor ([place])    Diabetes mellitus without complication ([place])    Eczema    Food allergy    Gastroparesis    Hypokalemia    Hypomagnesemia    Hypothyroid    Neuropathy    Post concussion syndrome 05/2019   Thyroid disease    Vision changes     Past Surgical History:  Procedure Laterality Date   BRAIN SURGERY     Portacath placement and removed     WISDOM TOOTH EXTRACTION      There were no vitals filed for this visit.  SPEECH THERAPY DISCHARGE SUMMARY  Visits from Start of Care: 4  Current functional level related to goals / functional outcomes: See goals below.   Remaining deficits: Mod (at least) cognitive communication deficits. Pt may benefit from neuropsych testing to ascertain more specific deficits/performance levels.    Education / Equipment: Compensations for [employer] and memory  Patient agrees to discharge. Patient goals were partially met. Patient is being discharged due to the patient's request.  (used all authorized visits).           ADULT SLP TREATMENT - 09/29/20 1555       General Information   Behavior/Cognition  Alert;Cooperative;Pleasant mood;Requires cueing      Cognitive-Linquistic Treatment   Treatment focused on Cognition;Patient/family/caregiver education    Skilled Treatment Pt entered with ankle brace provided by PT and she will need to give it back before she leaves. Tracey Perkins stated that using an alarm to eat lunch (instead of the reminder as we set up last week) is helping her better regulate her blood sugar due to eating sooner than she was before. She told SLP she wants to modify notes in her notes in notes app to be in [employer] because she looks at [employer] more often during the day -SLP agreed with Tracey Perkins with this. She is continuing to use compensations for attention such as timers. Pt stated she has difficulty recalling where certain groceries are in the store - SLP suggested pt write down aisle numbers to assist with this and put in Tracey Perkins. She has not downloaded Constant Therapy yet (is going to do so after d/c today), but asked for this information again so that she could get a discount on monthly subscripiton. On this, her last visit, she stated, "You don't know how much this has helped me with some life skills." Tracey Perkins recalled to give back ankle brace that she borrowed, without cues.      Assessment / Recommendations / Plan   Plan Discharge SLP treatment due to (comment)  pt used authorized visits     Progression Toward Goals   Progression toward goals --   dc day - look at goals             SLP Education - 09/29/20 1641     Education Details see note from today's session    Person(s) Educated Patient    Methods Explanation;Handout    Comprehension Verbalized understanding;Need further instruction                SLP Long Term Goals - 09/29/20 1556       SLP LONG TERM GOAL #1   Title pt will use 3 memory enhancing techniques between 2 sessions    Baseline 09-23-20; 09-29-20    Time 3    Period --   visits, for all LTGs   Status Partially Met      SLP LONG  TERM GOAL #2   Title pt will demo knowledge about when to incorporate compensations for attention between 2 sessions    Baseline 09-29-20    Time 3    Period --   visits   Status Partially Met      SLP LONG TERM GOAL #3   Title pt will demo knowledge about how to compensate for reduced processing skills (ask for repeat, double check her work, etc) with rare min A in 3 sessions    Baseline 09-29-20    Time 3    Period --   visits   Status Partially Met      SLP LONG TERM GOAL #4   Title pt will demo how to access cognitive activity apps on her phone 10/10 reps    Time 3    Period --   visits   Status Deferred              Plan - 09/29/20 1641     Clinical Impression Statement Tracey Perkins presents today with severe cognitive communication deficits in areas of attention, problem solving/processing, and memory, however practically deficits appear more moderate in nature. She scored 13/22 on the Self-Administered Gerocognitie Evaluation (SAGE) which is just below WNL for the MCI group norms, and > 3 standard deviations below the WNL norms. She has learned about some memory and attention compensations which will assist her in IADLs and ADLs in the future. Today, she will be d/c'd from Tracey Perkins and agrees with this as she has used her authorized visits.    Speech Therapy Frequency 1x /week    Duration --   one or once every other week   Treatment/Interventions Compensatory techniques;Functional tasks;Cognitive reorganization;Internal/external aids;Patient/family education;SLP instruction and feedback    Potential to Achieve Goals Fair    Potential Considerations Previous level of function    SLP Home Exercise Plan Constant Therapy app was discussed today    Consulted and Agree with Plan of Care Patient;Family member/caregiver    Family Member Consulted mother             Patient will benefit from skilled therapeutic intervention in order to improve the following deficits and  impairments:   Cognitive communication deficit    Problem List Patient Active Problem List   Diagnosis Date Noted   Mild cognitive impairment 09/04/2020   Diabetic gastroparesis ([place]) 09/04/2020   Acute CVA (cerebrovascular accident) ([place]) 09/03/2020   Impulsiveness 10/14/2019   Mild episode of recurrent major depressive disorder ([place]) 09/04/2019   Anxiety 08/16/2019   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  Status post radiation therapy 11/22/2018   Seasonal and perennial allergic rhinitis 11/22/2018   Seasonal allergic conjunctivitis 11/22/2018   Acute sinusitis 11/22/2018   Keratosis pilaris 10/05/2017   Melanocytic nevi of trunk 10/05/2017   Adverse food reaction 02/09/2017   Gastroesophageal reflux disease 02/09/2017   Mild intermittent asthma with acute exacerbation 11/19/2015   Mild persistent asthma without complication 54/30/1484   Acute seasonal allergic rhinitis due to pollen 11/16/2015   Anaphylactic shock due to adverse food reaction 11/16/2015   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, CCC-SLP  09/29/2020, 4:43 PM  [facility] [address] [place], [name], [address] Phone: [phone]   Fax:  [phone]   Name: ITZELL BENDAVID MRN: [id number] Date of Birth: 05-19-1985

## 2020-09-30 NOTE — Therapy (Signed)
[facility] [address] [place], [name], [address] Phone: [phone]   Fax:  [phone]  Physical Therapy Treatment  Patient Details  Name: BENNETT KAUZLARICH MRN: [id number] Date of Birth: [birth year] Referring Provider (PT): Edwin Dada, MD   Encounter Date: 09/29/2020   PT End of Session - 09/29/20 1512     Visit Number 4    Number of Visits 9    Date for PT Re-Evaluation 11/06/20    Authorization Type [employer] [place] Medicaid $4 copay    Authorization Time Period 8 visits from 8/30 - 10/18    Authorization - Visit Number 3    Authorization - Number of Visits 8    PT Start Time 1500    PT Stop Time 1535    PT Time Calculation (min) 35 min    Activity Tolerance Patient tolerated treatment well    Behavior During Therapy [facility] for tasks assessed/performed             Past Medical History:  Diagnosis Date   Asthma    Brain tumor ([place])    Diabetes mellitus without complication ([place])    Eczema    Food allergy    Gastroparesis    Hypokalemia    Hypomagnesemia    Hypothyroid    Neuropathy    Post concussion syndrome 05/2019   Thyroid disease    Vision changes     Past Surgical History:  Procedure Laterality Date   BRAIN SURGERY     Portacath placement and removed     WISDOM TOOTH EXTRACTION      There were no vitals filed for this visit.   Subjective Assessment - 09/29/20 1512     Subjective Has been walking and doing exercises, still veering some to the R.  Is getting the hang of the sit > stand.  Stood up yesterday when foot was asleep and rolled her ankle and fell.  Was painful and swollen yesterday but improved today.  Less painful today, no bruising observed.    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    Currently in Pain? Yes               [facility] Adult PT Treatment/Exercise - 09/29/20 1525       Therapeutic Activites    Therapeutic Activities Other Therapeutic Activities    Other Therapeutic Activities Allowed pt to trial ASO on R ankle during therapy session to improve stability during gait and stance.  Pt did feel more stable with ASO donned; wore to Speech therapy after PT and then returned to PT at end of speech session.  Provided pt with picture to take shopping to purchase if needed.      Neuro Re-ed    Neuro Re-ed Details  Walking with LUE raised and touching wall to L for increased weight shift to L and midline: forwards and backwards x 2 with gaze forwards, 2 laps forwards and backwards with head turned to the R and min A to control weight shift backwards and cues to keep weight shifted to L.  Continued to utilize hand on wall to L for weight shift to L while performing L SLS and stepping RLE forwards to put knee in chair and then remove to simulate gait sequence and anterior/posterior weight shifting.  Min-mod A required to sequence weight shift and prevent fall.                     PT Education - 09/30/20 1021     Education Details use of ASO on R ankle for stability during gait/balance    Person(s) Educated Patient    Methods Explanation;Handout    Comprehension Verbalized understanding              PT Short Term Goals - 09/15/20 1426       PT SHORT TERM GOAL #1   Title Patient will undergo further balance assesment with Berg/DGI as applicable and LTG to be set    Baseline assessed today 8/30    Time 4    Period Weeks    Status Achieved    Target Date 10/09/20       PT SHORT TERM GOAL #2   Title Pt be able to perform HEP with supervision focusing on strength and standing balance    Baseline HEP established at prior POC    Time 4    Period Weeks    Status New      PT SHORT TERM GOAL #3   Title Pt will demonstrate ability to perform stand pivot transfers consistently to L and R with supervision and without AD    Baseline CGA required    Time 4    Period Weeks    Status New               PT Long Term Goals - 09/30/20 1017       PT LONG TERM GOAL #1   Title Patient will demonstrate ability to perform final HEP independently at home for improved strength/balance (ALL LTG DUE BY 11/03/20)    Baseline HEP established at prior POC    Time 8    Period Weeks    Status New      PT LONG TERM GOAL #2   Title Pt will improve BERG balance score to >/= 40/56 and will improve DGI to >/= 16/24 to indicate decreased falls risk    Baseline BERG: 36/56; DGI: 8/24 without rollator    Time 8    Period Weeks    Status Revised      PT LONG TERM GOAL #3   Title Patient will improve 5x sit <> stand to </= 12 seconds without UE support    Baseline 14.20 seconds with UE support    Time 8    Period Weeks    Status New      PT LONG TERM GOAL #4   Title Patient will improve gait speed to >/= 2.62 ft/sec to demo improved community ambulator with LRAD    Baseline 2.3 ft/sec    Time 8    Period Weeks    Status New      PT LONG TERM GOAL #5   Title Paitent will be able to ambulate >/= 500 ft with LRAD and supervision to demo improved household mobility    Baseline 115 CGA with RW    Time 8    Period Weeks    Status New                   Plan -  09/30/20 1017     Clinical Impression Statement Treatment session focused on providing pt with options for ankle stability due to recent fall and injury to ankle; pt did not present with pain with WB or pain with palpation to lateral malleolus.  Pt able to tolerate gait and balance training with ASO  on R ankle without UE support.  Focused gait and balance training on controlled weight shifting to L and reorientation to midline.  Will continue to address and progress towards LTG.    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, childhood, nystagmus, visual field defect, sensorineural hearing loss of bilat ears    Examination-Activity Limitations Bathing;Bend;Dressing;Locomotion Level;Stairs;Stand;Reach Overhead;Transfers    Examination-Participation Restrictions Community Activity;School    Stability/Clinical Decision Making Evolving/Moderate complexity    Rehab Potential Good    PT Frequency 1x / week    PT Duration 8 weeks    PT Treatment/Interventions ADLs/Self Care Home Management;Aquatic Therapy;Canalith Repostioning;DME Instruction;Gait training;Stair training;Functional mobility training;Patient/family education;Therapeutic activities;Orthotic Fit/Training;Therapeutic exercise;Balance training;Neuromuscular re-education;Vestibular    PT Next Visit Plan How is R ankle, did she get ASO?  Check STG.  walking with head turns, balance with head turns.  Focus on transitions and controlled weight shifting R<>midline, weight shift to L and maintaining.  Do pool again?    Consulted and Agree with Plan of Care Patient;Family member/caregiver    Family Member Consulted Mother             Patient will benefit from skilled therapeutic intervention in order to improve the following deficits and impairments:  Abnormal gait, Decreased coordination, Difficulty walking, Dizziness, Decreased activity tolerance, Decreased balance, Decreased strength, Impaired sensation, Pain  Visit Diagnosis: Muscle  weakness (generalized)  Other lack of coordination  Other abnormalities of gait and mobility  Repeated falls  Unsteadiness on feet     Problem List Patient Active Problem List   Diagnosis Date Noted   Mild cognitive impairment 09/04/2020   Diabetic gastroparesis ([place]) 09/04/2020   Acute CVA (cerebrovascular accident) ([place]) 09/03/2020   Impulsiveness 10/14/2019   Mild episode of recurrent major depressive disorder ([place]) 09/04/2019   Anxiety 08/16/2019   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   Status post radiation therapy 11/22/2018   Seasonal and perennial allergic rhinitis 11/22/2018   Seasonal allergic conjunctivitis 11/22/2018   Acute sinusitis 11/22/2018   Keratosis pilaris 10/05/2017   Melanocytic nevi of trunk 10/05/2017   Adverse food reaction 02/09/2017   Gastroesophageal reflux disease 02/09/2017   Mild intermittent asthma with acute exacerbation 11/19/2015   Mild persistent asthma without complication [id number]   Acute seasonal allergic rhinitis due to pollen 11/16/2015   Anaphylactic shock due to adverse food reaction 11/16/2015   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

## 2020-10-01 ENCOUNTER — Ambulatory Visit: Payer: Medicaid Other | Admitting: Physical Therapy

## 2020-10-07 ENCOUNTER — Other Ambulatory Visit: Payer: Self-pay | Admitting: Family Medicine

## 2020-10-08 ENCOUNTER — Encounter (HOSPITAL_COMMUNITY): Payer: Self-pay | Admitting: Psychiatry

## 2020-10-08 ENCOUNTER — Telehealth (INDEPENDENT_AMBULATORY_CARE_PROVIDER_SITE_OTHER): Payer: Medicaid Other | Admitting: Psychiatry

## 2020-10-08 DIAGNOSIS — F419 Anxiety disorder, unspecified: Secondary | ICD-10-CM

## 2020-10-08 DIAGNOSIS — F330 Major depressive disorder, recurrent, mild: Secondary | ICD-10-CM | POA: Diagnosis not present

## 2020-10-08 MED ORDER — CITALOPRAM HYDROBROMIDE 10 MG PO TABS: 5.0000 mg | ORAL_TABLET | Freq: Every day | ORAL | 3 refills | Status: DC

## 2020-10-08 NOTE — Progress Notes (Signed)
[facility] MD/PA/NP OP Progress Note Virtual Visit via Video Note  I connected with Tracey Perkins on 10/08/20 at  1:00 PM EDT by a video enabled telemedicine application and verified that I am speaking with the correct person using two identifiers.  Location: Patient: Home Provider: Clinic   I discussed the limitations of evaluation and management by telemedicine and the availability of in person appointments. The patient expressed understanding and agreed to proceed.  I provided 30 minutes of non-face-to-face time during this encounter.        10/08/2020 12:03 PM Tracey Perkins  MRN:  [id number]  Chief Complaint:  "I have been feeling anxious several times a day"     HPI: 35 year old female seen today for follow up psychiatric evaluation.   She has a psychiatric history of depression and anxiey.  She is currently being managed on Celexa 5 mg daily. She notes that her medication is somewhat effective in managing her psychiatric conditions.  Today she well groomed, pleasant, cooperative, engaged in conversation and maintained eye contact.  She informed Probation officer that for the last few weeks she has been more anxious.Patient noted that she had a stroke recently.  Patient was admitted  to [facility] on 09/02/2020-09/07/2020.  She was discharged on Crestor, aspirin and Plavix.She notes that she believes her increased anxiety is due to adjustments in her cholesterol medication.  Patient was seen with her mother who notes that she was started on Crestor however began to have increased anxiety and chest pains.  She notes that her primary care doctor now wants to discontinue Crestor and start pravastatin.  She also informed Probation officer that her OB/GYN is changing her birth control from estrogen-based to progesterone-based and noted that she believed her anxiety could be contributed to hormonal imbalances.   Patient informed writer that due to the above she has been thinking about her health and death more  often.  She notes that her sleep fluctuates because she has been having nightmares.  She informed Probation officer that she dreams of the phantom of death.  She also notes that her appetite has been reduced but denies weight loss.  Today she denies SI/HI/VAH, mania, or paranoia.  Patient mother notes that recently she had some impulsive behaviors.  She notes that she was speaking to a gentleman online and notes that randomly this gentleman showed up to their home.  Patient notes that she is no longer dating this gentleman as her family believes that the relationship was inappropriate.   Patient informed Probation officer that she would like to start Ritalin to help better manage her concentration.  She notes that she spoke to her neurologist who informed her that the medication may be effective.  Provider informed patient that she does not recommend the start of Ritalin today due to the above comorbidities and suggested that this be evaluated at a different date.  Patient mother was also interested in [place] testing however provider recommended that this be placed on hold at this time.  She was agreeable to this recommendation.  No medication changes made today.  Patient agreeable to continue Celexa as prescribed.  Patient will follow up with outpatient counseling for therapy.  No other concerns noted at this time.   Visit Diagnosis:    ICD-10-CM   1. Anxiety  F41.9 citalopram (CELEXA) 10 MG tablet    2. Mild episode of recurrent major depressive disorder (HCC)  F33.0 citalopram (CELEXA) 10 MG tablet      Past Psychiatric History:  Anxiety and depression   Past Medical History:  Past Medical History:  Diagnosis Date   Asthma    Brain tumor ([place])    Diabetes mellitus without complication ([place])    Eczema    Food allergy    Gastroparesis    Hypokalemia    Hypomagnesemia    Hypothyroid    Neuropathy    Post concussion syndrome 05/2019   Thyroid disease    Vision changes     Past Surgical History:   Procedure Laterality Date   BRAIN SURGERY     Portacath placement and removed     WISDOM TOOTH EXTRACTION      Family Psychiatric History: Maternal uncle schizophrenia and overdosed on pain pills. Paternal cousin and aunt bipolar  Family History:  Family History  Problem Relation Age of Onset   Thyroid disease Mother    Eczema Sister    Thyroid disease Sister    Thyroid disease Sister     Social History:  Social History   Socioeconomic History   Marital status: Single    Spouse name: Not on file   Number of children: Not on file   Years of education: Not on file   Highest education level: Not on file  Occupational History   Not on file  Tobacco Use   Smoking status: Never   Smokeless tobacco: Never  Vaping Use   Vaping Use: Never used  Substance and Sexual Activity   Alcohol use: No   Drug use: No   Sexual activity: Yes    Birth control/protection: Pill  Other Topics Concern   Not on file  Social History Narrative   Not on file   Social Determinants of Health   Financial Resource Strain: Not on file  Food Insecurity: Not on file  Transportation Needs: Not on file  Physical Activity: Not on file  Stress: Not on file  Social Connections: Not on file    Allergies:  Allergies  Allergen Reactions   Banana Other (See Comments)    Mouth burning and swollen   Bactrim [Sulfamethoxazole-Trimethoprim] Hives   Doxycycline Hives   Gentian Violet Other (See Comments)    "It burns."  Per mother   Gentian Violet-Proflavine Sulfate [Triple Dye] Other (See Comments)    Mouth burn   Mold Extract [Trichophyton] Other (See Comments)    Hay Fever   Morphine And Related Hives   Other Other (See Comments) and Hives    "Hay fever, dust and pollen."  Per patient.   Vancomycin Other (See Comments)    Red man syndrome     Metabolic Disorder Labs: Lab Results  Component Value Date   HGBA1C 8.5 (H) 09/03/2020   MPG 197.25 09/03/2020   No results found for:  PROLACTIN Lab Results  Component Value Date   CHOL 215 (H) 09/03/2020   TRIG 198 (H) 09/03/2020   HDL 40 (L) 09/03/2020   CHOLHDL 5.4 09/03/2020   VLDL 40 09/03/2020   LDLCALC 135 (H) 09/03/2020   No results found for: TSH  Therapeutic Level Labs: No results found for: LITHIUM No results found for: VALPROATE No components found for:  CBMZ  Current Medications: Current Outpatient Medications  Medication Sig Dispense Refill   acetaminophen (TYLENOL) 500 MG tablet Take 500 mg by mouth every 6 (six) hours as needed for moderate pain.      aspirin EC 81 MG tablet Take 81 mg by mouth daily. Swallow whole.     azelastine (ASTELIN) 0.1 % nasal spray  Place 2 sprays into both nostrils 2 (two) times daily. (Patient taking differently: Place 2 sprays into both nostrils 2 (two) times daily as needed for rhinitis or allergies.) 30 mL 5   citalopram (CELEXA) 10 MG tablet Take 0.5 tablets (5 mg total) by mouth daily. Patient will cut pill in half. She has a traumatic brain injury and will take half the dose. 30 tablet 3   clopidogrel (PLAVIX) 75 MG tablet Take 1 tablet (75 mg total) by mouth daily. 30 tablet 3   Continuous Blood Gluc Transmit (DEXCOM G6 TRANSMITTER) MISC USE 1 EACH EVERY 3 (THREE) MONTHS     cyanocobalamin 1000 MCG tablet Take 1,000 mcg by mouth at bedtime.     DERMOTIC 0.01 % OIL Place 4 drops into both ears See admin instructions. Twice a week     EPINEPHrine 0.3 mg/0.3 mL IJ SOAJ injection Use as directed for severe allergic reaction. 2 each 1   fluticasone (FLONASE) 50 MCG/ACT nasal spray 2 sprays per nostril daily as needed for stuffy nose. (Patient taking differently: Place 1 spray into both nostrils daily as needed for allergies or rhinitis.) 16 g 5   fluticasone (FLOVENT HFA) 110 MCG/ACT inhaler Inhale 2 puffs into the lungs daily. Rinse, gargle and spit out after use. (Patient taking differently: Inhale 2 puffs into the lungs daily as needed (wheezing/shortness of breath).  Rinse, gargle and spit out after use.) 1 each 5   gabapentin (NEURONTIN) 300 MG capsule Take 300 mg by mouth 3 (three) times daily.  3   glucagon 1 MG injection Inject 1 mg into the skin once as needed (for diabeties).     Glucagon 3 MG/DOSE POWD Place 3 mg into the nose as needed (low blood sugar).     insulin aspart (NOVOLOG) 100 UNIT/ML injection USE UP TO 130 UNITS DAILY, AS DIRECTED IN INSULIN PUMP.     insulin glargine (LANTUS) 100 UNIT/ML injection Inject 16 Units into the skin daily.     Insulin Human (INSULIN PUMP) SOLN Inject 80 each into the skin daily. Novolog Insulin Pump     KLOR-CON M10 10 MEQ tablet Take 10 mEq by mouth daily.     levothyroxine (SYNTHROID) 100 MCG tablet Take 100 mcg by mouth daily before breakfast.      loratadine (CLARITIN) 10 MG tablet Take 1 tablet (10 mg total) by mouth daily. 30 tablet 5   lubiprostone (AMITIZA) 8 MCG capsule Take 16 mcg by mouth daily with breakfast.      magnesium oxide (MAG-OX) 400 MG tablet Take 800 mg by mouth at bedtime.     meclizine (ANTIVERT) 25 MG tablet Take 25 mg by mouth 2 (two) times daily as needed for dizziness.     montelukast (SINGULAIR) 10 MG tablet TAKE 1 TABLET (10 MG TOTAL) BY MOUTH AT BEDTIME. TO PREVENT COUGHING OR WHEEZING 34 tablet 5   ondansetron (ZOFRAN) 4 MG tablet Take 1 tablet (4 mg total) by mouth every 8 (eight) hours as needed for nausea or vomiting. 12 tablet 0   pantoprazole (PROTONIX) 20 MG tablet Take 1 tablet (20 mg total) by mouth daily. 30 tablet 2   Polyethyl Glycol-Propyl Glycol (SYSTANE OP) Place 1 drop into both eyes daily as needed.     PROAIR HFA 108 (90 Base) MCG/ACT inhaler TAKE 2 PUFFS BY MOUTH EVERY 4 HOURS AS NEEDED (Patient taking differently: Inhale 2 puffs into the lungs every 6 (six) hours as needed for wheezing or shortness of breath.) 6.7 g  0   rosuvastatin (CRESTOR) 5 MG tablet Take 1 tablet (5 mg total) by mouth daily. 30 tablet 1   Saccharomyces boulardii (FLORASTOR PO) Take 1  capsule by mouth daily.     No current facility-administered medications for this visit.     Musculoskeletal: Strength & Muscle Tone:  Unable to assess due to telehealth visit [place]:  Unable to assess due to telhealth visit Patient leans: N/A  Psychiatric Specialty Exam: Review of Systems  There were no vitals taken for this visit.There is no height or weight on file to calculate BMI.  General Appearance: Well Groomed  Eye Contact:  Good  Speech:  Clear and Coherent and Normal Rate  Volume:  Normal  Mood:  Euthymic  Affect:  Appropriate and Congruent  Thought Process:  Coherent, Goal Directed and Linear  Orientation:  Full (Time, Place, and Person)  Thought Content: WDL and Logical   Suicidal Thoughts:  No  Homicidal Thoughts:  No  Memory:  Immediate;   Good Recent;   Good Remote;   Good  Judgement:  Good  Insight:  Good  Psychomotor Activity:  Normal  Concentration:  Concentration: Good and Attention Span: Good  Recall:  Good  Fund of Knowledge: Good  Language: Good  Akathisia:  No  Handed:  Right  AIMS (if indicated):Not done  Assets:  Communication Skills Desire for Improvement Financial Resources/Insurance Housing Social Support  ADL's:  Intact  Cognition: WNL  Sleep:  Fair   Screenings: GAD-7    Flowsheet Row Video [facility] 07/08/2020 in [facility] Video Visit from 04/20/2020 in [facility]  Total GAD-7 Score 8 7      PHQ2-9    Flowsheet Row Video Visit from 07/08/2020 in [facility] Video Visit from 04/20/2020 in [facility] Counselor from 09/17/2019 in [facility] Nutrition from 08/20/2014 in [facility]  PHQ-2 Total Score 1 0 2 0  PHQ-9 Total Score 9 6 7  --      Flowsheet Row ED to Hosp-Admission (Discharged) from 09/02/2020 in [facility] from  06/03/2020 in [facility] ED from 05/27/2020 in [facility] No Risk No Risk No Risk        Assessment and Plan: Patient notes that since having her stroke she has had less concentration is unable to focus.  Also notes that she has been more anxious but can attributes her anxiety to change in her cholesterol medications and birth control.  Patient informed Probation officer that she would like to start Ritalin to help better manage her concentration.  She notes that she spoke to her neurologist who informed her that the medication may be effective.  Provider informed patient that she does not recommend the start of Ritalin today due to the above comorbidities and suggested that this be evaluated at a different date.  Patient mother was also interested in [place] testing however provider recommended that this be placed on hold at this time.  She was agreeable to this recommendation.  No medication changes made today.  Patient agreeable to continue Celexa as prescribed.  .  1. Anxiety  Continue- citalopram (CELEXA) 10 MG tablet; Take 0.5 tablets (5 mg total) by mouth daily. Patient will cut pill in half. She has a traumatic brain injury and will take half the dose.  Dispense:  30 tablet; Refill: 3  2. Mild episode of recurrent major depressive disorder (HCC)  Continue- citalopram (CELEXA) 10 MG tablet; Take 0.5 tablets (5 mg total) by mouth daily. Patient will cut pill in half. She has a traumatic brain injury and will take half the dose.  Dispense: 30 tablet; Refill: 3     Follow-up in 3 months Follow-up with therapy  Salley Slaughter, NP 10/08/2020, 12:03 PM

## 2020-10-09 ENCOUNTER — Other Ambulatory Visit: Payer: Self-pay

## 2020-10-09 ENCOUNTER — Ambulatory Visit: Payer: Medicaid Other | Admitting: Physical Therapy

## 2020-10-09 DIAGNOSIS — R2689 Other abnormalities of gait and mobility: Secondary | ICD-10-CM

## 2020-10-09 DIAGNOSIS — R296 Repeated falls: Secondary | ICD-10-CM

## 2020-10-09 DIAGNOSIS — R278 Other lack of coordination: Secondary | ICD-10-CM

## 2020-10-09 DIAGNOSIS — M6281 Muscle weakness (generalized): Secondary | ICD-10-CM

## 2020-10-09 DIAGNOSIS — R41841 Cognitive communication deficit: Secondary | ICD-10-CM | POA: Diagnosis not present

## 2020-10-09 DIAGNOSIS — R2681 Unsteadiness on feet: Secondary | ICD-10-CM

## 2020-10-09 NOTE — Therapy (Signed)
[facility] [address] [place], [name], [address] Phone: [phone]   Fax:  [phone]  Physical Therapy Treatment  Patient Details  Name: Tracey Perkins MRN: [id number] Date of Birth: 03-Apr-1985 Referring Provider (PT): Edwin Dada, MD   Encounter Date: 10/09/2020   PT End of Session - 10/09/20 1326     Visit Number 5    Number of Visits 9    Date for PT Re-Evaluation 11/06/20    Authorization Type [employer] [place] Medicaid $4 copay    Authorization Time Period 8 visits from 8/30 - 10/18    Authorization - Visit Number 4    Authorization - Number of Visits 8    PT Start Time 1443    PT Stop Time 1400    PT Time Calculation (min) 38 min    Activity Tolerance Patient tolerated treatment well    Behavior During Therapy [facility] for tasks assessed/performed             Past Medical History:  Diagnosis Date   Asthma    Brain tumor ([place])    Diabetes mellitus without complication ([place])    Eczema    Food allergy    Gastroparesis    Hypokalemia    Hypomagnesemia    Hypothyroid    Neuropathy    Post concussion syndrome 05/2019   Thyroid disease    Vision changes     Past Surgical History:  Procedure Laterality Date   BRAIN SURGERY     Portacath placement and removed     WISDOM TOOTH EXTRACTION      There were no vitals filed for this visit.   Subjective Assessment - 10/09/20 1327     Subjective Is feeling better, mind feels clearer; has been participating in a mindfulness class which is helping.  Ankle feels better, is able to put more weight on it.  Saw a Stroke neurologist who said the radiation from her childhood had contributed to her recent CVA.  Feels safe and more balanced when in more enclosed spaces, wide open spaces make pt feel more off balance.    Patient is accompained by: Family member   Mother, [name]   Pertinent History asthma, eczema, food allergy, polyneuropathy, uncontrolled  type 1 diabetes, gastroparesis, h/o chronic L thalamic infarct, new R thalamic infarct since 2016, lymphocytic thyroiditis, s/p radiation therapy, chemo and 2 surgical resection for medulloblastoma, childhood, nystagmus, visual field defect, sensorineural hearing loss of bilat ears    Limitations Standing;Walking    Patient Stated Goals Improving balance and decreasing falls.    Currently in Pain? No/denies              [facility] Adult PT Treatment/Exercise - 10/09/20 1331       Transfers   Transfers Sit to Stand;Stand to [employer] Transfers    Sit to Stand 6: Modified independent (Device/Increase time)    Stand to Sit 6: Modified independent (Device/Increase time)    Stand Pivot Transfers 6: Modified independent (Device/Increase time)    Stand Pivot Transfer Details (indicate cue type and reason) without UE support and without AD      Exercises   Exercises Other Exercises    Other Exercises  Updated and revised HEP as below.  Pt instructed on how to perform safely at home with UE support or in corner to prevent LOB.            Access Code: [email or web address] URL: [email or web address] Date: 10/09/2020 Prepared by: Letta Moynahan  Yazeed Pryer  Exercises Supine Hamstring Stretch with Strap - 1 x daily - 7 x weekly - 2 sets - 30 second hold Seated Gastroc Stretch with Strap - 1 x daily - 7 x weekly - 2 sets - 30 seconds hold Forward Walking with Looking to the RIGHT - 1 x daily - 7 x weekly - 4 sets - No change Romberg Stance with Head Rotation - 1 x daily - 7 x weekly - 1 sets - 10 reps - No change SIT TO STAND ON A CUSHION/PILLOW - 1 x daily - 7 x weekly - 1 sets - 10 reps - progressed to standing on compliant surface Standing Single Leg Stance with Counter Support - 1 x daily - 7 x weekly - 2 sets - 10 seconds hold - Added head turns if pt has close supervision from family Wide Stance with Eyes Closed - 1 x daily - 7 x weekly - 1 sets - 10 reps - added to HEP    PT Education -  10/09/20 2113     Education Details updated HEP    Person(s) Educated Patient;Parent(s)    Methods Explanation;Demonstration;Handout    Comprehension Verbalized understanding;Returned demonstration              PT Short Term Goals - 10/09/20 1337       PT SHORT TERM GOAL #1   Title Patient will undergo further balance assesment with Berg/DGI as applicable and LTG to be set    Baseline assessed today 8/30    Time 4    Period Weeks    Status Achieved    Target Date 10/09/20      PT SHORT TERM GOAL #2   Title Pt be able to perform HEP with supervision focusing on strength and standing balance    Baseline HEP established at prior POC    Time 4    Period Weeks    Status Achieved      PT SHORT TERM GOAL #3   Title Pt will demonstrate ability to perform stand pivot transfers consistently to L and R with supervision and without AD    Baseline CGA required    Time 4    Period Weeks    Status Achieved               PT Long Term Goals - 09/30/20 1017       PT LONG TERM GOAL #1   Title Patient will demonstrate ability to perform final HEP independently at home for improved strength/balance (ALL LTG DUE BY 11/03/20)    Baseline HEP established at prior POC    Time 8    Period Weeks    Status New      PT LONG TERM GOAL #2   Title Pt will improve BERG balance score to >/= 40/56 and will improve DGI to >/= 16/24 to indicate decreased falls risk    Baseline BERG: 36/56; DGI: 8/24 without rollator    Time 8    Period Weeks    Status Revised      PT LONG TERM GOAL #3   Title Patient will improve 5x sit <> stand to </= 12 seconds without UE support    Baseline 14.20 seconds with UE support    Time 8    Period Weeks    Status New      PT LONG TERM GOAL #4   Title Patient will improve gait speed to >/= 2.62 ft/sec to demo improved community ambulator  with LRAD    Baseline 2.3 ft/sec    Time 8    Period Weeks    Status New      PT LONG TERM GOAL #5   Title  Paitent will be able to ambulate >/= 500 ft with LRAD and supervision to demo improved household mobility    Baseline 115 CGA with RW    Time [place] - 10/09/20 2044     Clinical Impression Statement Pt demonstrates good progress towards STG and has met all STG.  Pt is now able to perform sit > stand and stand pivot transfers independently without external support for balance.  Due to progress, rest of session focused on reviewing and updating HEP.  Pt continues to experience dizziness with quick turns and continues to experience LOB to the R, especially with head turns to R.  Will continue to address and progress towards LTG.    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, childhood, nystagmus, visual field defect, sensorineural hearing loss of bilat ears    Examination-Activity Limitations Bathing;Bend;Dressing;Locomotion Level;Stairs;Stand;Reach Overhead;Transfers    Examination-Participation Restrictions Community Activity;School    Stability/Clinical Decision Making Evolving/Moderate complexity    Rehab Potential Good    PT Frequency 1x / week    PT Duration 8 weeks    PT Treatment/Interventions ADLs/Self Care Home Management;Aquatic Therapy;Canalith Repostioning;DME Instruction;Gait training;Stair training;Functional mobility training;Patient/family education;Therapeutic activities;Orthotic Fit/Training;Therapeutic exercise;Balance training;Neuromuscular re-education;Vestibular    PT Next Visit Plan SLS with head turns.  Focus on transitions and controlled weight shifting R<>midline, weight shift to L and maintaining.  Do pool again?   Balance in more wide open spaces    Consulted and Agree with Plan of Care Patient;Family member/caregiver    Family Member Consulted Father             Patient will benefit from skilled therapeutic intervention in order to improve the following deficits and impairments:  Abnormal gait, Decreased coordination, Difficulty walking, Dizziness, Decreased activity tolerance, Decreased balance, Decreased strength, Impaired sensation, Pain  Visit Diagnosis: Muscle weakness (generalized)  Other lack of coordination  Other abnormalities of gait and mobility  Repeated falls  Unsteadiness on feet     Problem List Patient Active Problem List   Diagnosis Date Noted   Mild cognitive impairment 09/04/2020   Diabetic gastroparesis ([place]) 09/04/2020   Acute CVA (cerebrovascular accident) ([place]) 09/03/2020   Impulsiveness 10/14/2019   Mild episode of recurrent major depressive disorder ([place]) 09/04/2019   Anxiety 08/16/2019   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   Status post radiation therapy 11/22/2018   Seasonal and perennial allergic rhinitis 11/22/2018   Seasonal allergic conjunctivitis 11/22/2018   Acute sinusitis 11/22/2018   Keratosis pilaris 10/05/2017   Melanocytic nevi of trunk 10/05/2017   Adverse food reaction 02/09/2017   Gastroesophageal reflux disease 02/09/2017   Mild intermittent asthma with acute exacerbation 11/19/2015   Mild persistent asthma without complication 88/32/5498   Acute seasonal allergic rhinitis due to pollen 11/16/2015   Anaphylactic shock due to adverse food reaction 11/16/2015   Medulloblastoma ([place]) 11/16/2015   Type 1 diabetes mellitus without  complication ([place]) 82/50/0370   Dry mouth 11/16/2015   Xerosis cutis 11/16/2015   Nystagmus 08/15/2014   Visual field defect 08/15/2014   Disequilibrium 07/25/2014   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   Rico Junker, PT, DPT 10/09/20    9:23  PM    [facility] [address] [place], [name], [address] Phone: [phone]   Fax:  [phone]  Name: Tracey Perkins MRN: [id number] Date of Birth: [birth year]

## 2020-10-09 NOTE — Patient Instructions (Addendum)
Access Code: [email or web address] URL: [email or web address] Date: 10/09/2020 Prepared by: Misty Stanley  Exercises Supine Hamstring Stretch with Strap - 1 x daily - 7 x weekly - 2 sets - 30 second hold Seated Gastroc Stretch with Strap - 1 x daily - 7 x weekly - 2 sets - 30 seconds hold Forward Walking with Looking to the RIGHT - 1 x daily - 7 x weekly - 4 sets Romberg Stance with Head Rotation - 1 x daily - 7 x weekly - 1 sets - 10 reps SIT TO STAND ON A CUSHION/PILLOW - 1 x daily - 7 x weekly - 1 sets - 10 reps Standing Single Leg Stance with Counter Support - 1 x daily - 7 x weekly - 2 sets - 10 seconds hold Wide Stance with Eyes Closed - 1 x daily - 7 x weekly - 1 sets - 10 reps

## 2020-10-13 ENCOUNTER — Other Ambulatory Visit: Payer: Self-pay

## 2020-10-13 ENCOUNTER — Ambulatory Visit: Payer: Medicaid Other | Admitting: Physical Therapy

## 2020-10-13 DIAGNOSIS — R278 Other lack of coordination: Secondary | ICD-10-CM

## 2020-10-13 DIAGNOSIS — M6281 Muscle weakness (generalized): Secondary | ICD-10-CM

## 2020-10-13 DIAGNOSIS — R2681 Unsteadiness on feet: Secondary | ICD-10-CM

## 2020-10-13 DIAGNOSIS — R2689 Other abnormalities of gait and mobility: Secondary | ICD-10-CM

## 2020-10-13 DIAGNOSIS — R41841 Cognitive communication deficit: Secondary | ICD-10-CM | POA: Diagnosis not present

## 2020-10-13 DIAGNOSIS — R296 Repeated falls: Secondary | ICD-10-CM

## 2020-10-13 NOTE — Therapy (Signed)
[facility] [address] [place], [name], [address] Phone: [phone]   Fax:  [phone]  Physical Therapy Treatment  Patient Details  Name: Tracey Perkins MRN: [id number] Date of Birth: November 17, 1985 Referring Provider (PT): Edwin Dada, MD   Encounter Date: 10/13/2020   PT End of Session - 10/13/20 1735     Visit Number 6    Number of Visits 9    Date for PT Re-Evaluation 11/06/20    Authorization Type [employer] [place] Medicaid $4 copay    Authorization Time Period 8 visits from 8/30 - 10/18    Authorization - Visit Number 5    Authorization - Number of Visits 8    PT Start Time 1400    PT Stop Time 1448    PT Time Calculation (min) 48 min    Activity Tolerance Patient tolerated treatment well    Behavior During Therapy [facility] for tasks assessed/performed             Past Medical History:  Diagnosis Date   Asthma    Brain tumor ([place])    Diabetes mellitus without complication ([place])    Eczema    Food allergy    Gastroparesis    Hypokalemia    Hypomagnesemia    Hypothyroid    Neuropathy    Post concussion syndrome 05/2019   Thyroid disease    Vision changes     Past Surgical History:  Procedure Laterality Date   BRAIN SURGERY     Portacath placement and removed     WISDOM TOOTH EXTRACTION      There were no vitals filed for this visit.   Subjective Assessment - 10/13/20 1410     Subjective Went to the [employer] this past weekend.  Did a lot of walking, had to sit and rest on the rollator a couple times but didn't lose her balance, even with negotiating around other people.  Had one fall due to slipping in socks, landed on her buttocks.  No injuries.  Ankle is a lot better.  Went to the grocery store with Dad yesterday; had him walk on the R, when looking to the L pt became dizzy and off balance.    Patient is accompained by: Family member   Mother, Tracey Perkins   Pertinent History asthma,  eczema, food allergy, polyneuropathy, uncontrolled type 1 diabetes, gastroparesis, h/o chronic L thalamic infarct, new R thalamic infarct since 2016, lymphocytic thyroiditis, s/p radiation therapy, chemo and 2 surgical resection for medulloblastoma, childhood, nystagmus, visual field defect, sensorineural hearing loss of bilat ears    Limitations Standing;Walking    Patient Stated Goals Improving balance and decreasing falls.    Currently in Pain? No/denies                [facility] Adult PT Treatment/Exercise - 10/13/20 1747       Transfers   Transfers Sit to Stand;Stand to Sit;Stand Pivot Transfers    Sit to Stand 6: Modified independent (Device/Increase time)    Stand to Sit 6: Modified independent (Device/Increase time)    Stand Pivot Transfers 6: Modified independent (Device/Increase time)                 Balance Exercises - 10/13/20 1438       Balance Exercises: Standing   Standing, One Foot on a Step Eyes open;Eyes closed;Head turns;4 inch;10 secs;Limitations    Standing, One Foot on a Step Limitations Standing at counter with R foot then  L foot on yoga block beginning with bilat UE support > one UE support > no UE support holding x 10 seconds with EO.  Changed to EO with head turns to L and R x 10.  Also performed with EC with one UE support x 2 reps each side x 10 seconds with increased sway during R support.    Other Standing Exercises Standing on stepping stone with R then L foot, tapping forwards and back in diagonal pattern to simulate gait x 8 reps with bilat UE support.  Pt required cues for more upright posture; pt has to look at feet placement due to impaired sensation causing her head to lean forwards causing anterior LOB.  Also provided cues for full weight shift prior to stepping contralateral LE Forwards or backwards.                PT Education - 10/13/20 1754     Education Details plan for next visit; plan to practice curb again with rollator     Person(s) Educated Patient;Parent(s)    Methods Explanation    Comprehension Verbalized understanding              PT Short Term Goals - 10/09/20 1337       PT SHORT TERM GOAL #1   Title Patient will undergo further balance assesment with Berg/DGI as applicable and LTG to be set    Baseline assessed today 8/30    Time 4    Period Weeks    Status Achieved    Target Date 10/09/20      PT SHORT TERM GOAL #2   Title Pt be able to perform HEP with supervision focusing on strength and standing balance    Baseline HEP established at prior POC    Time 4    Period Weeks    Status Achieved      PT SHORT TERM GOAL #3   Title Pt will demonstrate ability to perform stand pivot transfers consistently to L and R with supervision and without AD    Baseline CGA required    Time 4    Period Weeks    Status Achieved               PT Long Term Goals - 09/30/20 1017       PT LONG TERM GOAL #1   Title Patient will demonstrate ability to perform final HEP independently at home for improved strength/balance (ALL LTG DUE BY 11/03/20)    Baseline HEP established at prior POC    Time 8    Period Weeks    Status New      PT LONG TERM GOAL #2   Title Pt will improve BERG balance score to >/= 40/56 and will improve DGI to >/= 16/24 to indicate decreased falls risk    Baseline BERG: 36/56; DGI: 8/24 without rollator    Time 8    Period Weeks    Status Revised      PT LONG TERM GOAL #3   Title Patient will improve 5x sit <> stand to </= 12 seconds without UE support    Baseline 14.20 seconds with UE support    Time 8    Period Weeks    Status New      PT LONG TERM GOAL #4   Title Patient will improve gait speed to >/= 2.62 ft/sec to demo improved community ambulator with LRAD    Baseline 2.3 ft/sec    Time 8  Period Weeks    Status New      PT LONG TERM GOAL #5   Title Paitent will be able to ambulate >/= 500 ft with LRAD and supervision to demo improved household mobility     Baseline 115 CGA with RW    Time 8    Period Weeks    Status New                   Plan - 10/13/20 1447     Clinical Impression Statement Continued to focus on balance with SLS activities with pt continuing to demonstrate increased difficulty with stance phase on R and requires use of vision to determine foot placement due to impaired sensation.  When looking down pt tends to lean too far forwards and experiences anterior LOB.  Will continue to address and progress towards LTG.    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, childhood, nystagmus, visual field defect, sensorineural hearing loss of bilat ears    Examination-Activity Limitations Bathing;Bend;Dressing;Locomotion Level;Stairs;Stand;Reach Overhead;Transfers    Examination-Participation Restrictions Community Activity;School    Stability/Clinical Decision Making Evolving/Moderate complexity    Rehab Potential Good    PT Frequency 1x / week    PT Duration 8 weeks    PT Treatment/Interventions ADLs/Self Care Home Management;Aquatic Therapy;Canalith Repostioning;DME Instruction;Gait training;Stair training;Functional mobility training;Patient/family education;Therapeutic activities;Orthotic Fit/Training;Therapeutic exercise;Balance training;Neuromuscular re-education;Vestibular    PT Next Visit Plan Sit > stand with turn and tap to stepping stone; curb with rollator.  Do pool again?  Balance in more wide open spaces    Consulted and Agree with Plan of Care Patient;Family member/caregiver    Family Member Consulted Mother             Patient will benefit from skilled therapeutic intervention in order to improve the  following deficits and impairments:  Abnormal gait, Decreased coordination, Difficulty walking, Dizziness, Decreased activity tolerance, Decreased balance, Decreased strength, Impaired sensation, Pain  Visit Diagnosis: Muscle weakness (generalized)  Other lack of coordination  Other abnormalities of gait and mobility  Repeated falls  Unsteadiness on feet     Problem List Patient Active Problem List   Diagnosis Date Noted   Mild cognitive impairment 09/04/2020   Diabetic gastroparesis ([place]) 09/04/2020   Acute CVA (cerebrovascular accident) ([place]) 09/03/2020   Impulsiveness 10/14/2019   Mild episode of recurrent major depressive disorder ([place]) 09/04/2019   Anxiety 08/16/2019   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   Status post radiation therapy 11/22/2018   Seasonal and perennial allergic rhinitis 11/22/2018   Seasonal allergic conjunctivitis 11/22/2018   Acute sinusitis 11/22/2018   Keratosis pilaris 10/05/2017   Melanocytic nevi of trunk 10/05/2017   Adverse food reaction 02/09/2017   Gastroesophageal reflux disease 02/09/2017   Mild intermittent asthma with acute exacerbation 11/19/2015   Mild persistent asthma without complication 90/24/0973   Acute seasonal allergic rhinitis due to pollen 11/16/2015   Anaphylactic shock due to adverse food reaction 11/16/2015   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 [place], [name], [address] Phone: [phone]   Fax:  [phone]  Name: LORENA BENHAM MRN: [id number] Date of Birth: [birth year]

## 2020-10-20 ENCOUNTER — Ambulatory Visit: Payer: Medicaid Other | Admitting: Physical Therapy

## 2020-10-22 ENCOUNTER — Other Ambulatory Visit: Payer: Self-pay

## 2020-10-22 ENCOUNTER — Ambulatory Visit: Payer: Medicaid Other | Attending: Neurology | Admitting: Physical Therapy

## 2020-10-22 DIAGNOSIS — R2689 Other abnormalities of gait and mobility: Secondary | ICD-10-CM | POA: Diagnosis present

## 2020-10-22 DIAGNOSIS — R296 Repeated falls: Secondary | ICD-10-CM | POA: Insufficient documentation

## 2020-10-22 DIAGNOSIS — R278 Other lack of coordination: Secondary | ICD-10-CM | POA: Insufficient documentation

## 2020-10-22 DIAGNOSIS — M6281 Muscle weakness (generalized): Secondary | ICD-10-CM | POA: Diagnosis present

## 2020-10-22 DIAGNOSIS — R2681 Unsteadiness on feet: Secondary | ICD-10-CM | POA: Insufficient documentation

## 2020-10-22 NOTE — Therapy (Signed)
[facility] [address] [place], [name], [address] Phone: [phone]   Fax:  [phone]  Physical Therapy Treatment  Patient Details  Name: Tracey Perkins MRN: [id number] Date of Birth: [birth year] Referring Provider (PT): Edwin Dada, MD   Encounter Date: 10/22/2020   PT End of Session - 10/22/20 1019     Visit Number 7    Number of Visits 9    Date for PT Re-Evaluation 11/06/20    Authorization Type [employer] [place] Medicaid $4 copay    Authorization Time Period 8 visits from 8/30 - 10/18    Authorization - Visit Number 6    Authorization - Number of Visits 8    PT Start Time 1017    PT Stop Time 1100    PT Time Calculation (min) 43 min    Equipment Utilized During Treatment Gait belt    Activity Tolerance Patient tolerated treatment well    Behavior During Therapy WFL for tasks assessed/performed             Past Medical History:  Diagnosis Date   Asthma    Brain tumor ([place])    Diabetes mellitus without complication ([place])    Eczema    Food allergy    Gastroparesis    Hypokalemia    Hypomagnesemia    Hypothyroid    Neuropathy    Post concussion syndrome 05/2019   Thyroid disease    Vision changes     Past Surgical History:  Procedure Laterality Date   BRAIN SURGERY     Portacath placement and removed     WISDOM TOOTH EXTRACTION      There were no vitals filed for this visit.   Subjective Assessment - 10/22/20 1021     Subjective Mother reports pt is still falling to the R.    Patient is accompained by: Family member   Mother, Tracey Perkins   Pertinent History asthma, eczema, food allergy, polyneuropathy, uncontrolled type 1 diabetes, gastroparesis, h/o chronic L thalamic infarct, new R thalamic infarct since 2016, lymphocytic thyroiditis, s/p radiation therapy, chemo and 2 surgical resection for medulloblastoma, childhood, nystagmus, visual field defect, sensorineural hearing loss of  bilat ears    Limitations Standing;Walking    Patient Stated Goals Improving balance and decreasing falls.    Currently in Pain? No/denies              [facility] - 10/22/20 1122       Ambulation/Gait   Ambulation/Gait Yes    Ambulation/Gait Assistance 4: Min assist    Ambulation/Gait Assistance Details without AD; carry over of balance exercises to dynamic gait: while walking around gym cued pt to perform high knee step overs to simulate stepping over obstacles, side stepping to R with head turn to R, retro gait and making 180 turns to R and L with one full turn instead of 3-4 small steps.  Provided min A, no significant LOB during higher level gait challenges today    Ambulation Distance (Feet) 400 Feet    Assistive device None    Gait Pattern Step-through pattern;Wide base of support    Ambulation Surface Level;Indoor               Balance Exercises - 10/22/20 1023       Balance Exercises: Standing   Balance Beam Repeated all balance exercises on blue balance beam: High knee marching with tandem gait, side stepping to L and R with looking to L  and R, 180 deg turns on balance beam with one UE support down and back x2 with close supervision in // bars    Sidestepping Upper extremity support;4 reps;Limitations    Sidestepping Limitations in // bars, slow and controlled    Turning Right;Left;5 reps;Limitations    Turning Limitations 180 to R and L with one foot planted, pivoting in // bars beginning with bilat UE support > one UE support    Marching Solid surface;Upper extremity assist 2;Dynamic;Forwards;Limitations    Marching Limitations in with 2-3 second hold in SLS; slow and controlled                  PT Short Term Goals - 10/09/20 1337       PT SHORT TERM GOAL #1   Title Patient will undergo further balance assesment with Berg/DGI as applicable and LTG to be set    Baseline assessed today 8/30    Time 4    Period Weeks    Status  Achieved    Target Date 10/09/20      PT SHORT TERM GOAL #2   Title Pt be able to perform HEP with supervision focusing on strength and standing balance    Baseline HEP established at prior POC    Time 4    Period Weeks    Status Achieved      PT SHORT TERM GOAL #3   Title Pt will demonstrate ability to perform stand pivot transfers consistently to L and R with supervision and without AD    Baseline CGA required    Time 4    Period Weeks    Status Achieved               PT Long Term Goals - 09/30/20 1017       PT LONG TERM GOAL #1   Title Patient will demonstrate ability to perform final HEP independently at home for improved strength/balance (ALL LTG DUE BY 11/03/20)    Baseline HEP established at prior POC    Time 8    Period Weeks    Status New      PT LONG TERM GOAL #2   Title Pt will improve BERG balance score to >/= 40/56 and will improve DGI to >/= 16/24 to indicate decreased falls risk    Baseline BERG: 36/56; DGI: 8/24 without rollator    Time 8    Period Weeks    Status Revised      PT LONG TERM GOAL #3   Title Patient will improve 5x sit <> stand to </= 12 seconds without UE support    Baseline 14.20 seconds with UE support    Time 8    Period Weeks    Status New      PT LONG TERM GOAL #4   Title Patient will improve gait speed to >/= 2.62 ft/sec to demo improved community ambulator with LRAD    Baseline 2.3 ft/sec    Time 8    Period Weeks    Status New      PT LONG TERM GOAL #5   Title Paitent will be able to ambulate >/= 500 ft with LRAD and supervision to demo improved household mobility    Baseline 115 CGA with RW    Time [place] - 10/22/20 1126  Clinical Impression Statement Progressed treatment today to focusing on more dynamic gait challenges on solid surface > compliant surface > and then over ground during dynamic gait without UE support.  Also continued to incorporate  dual task with alternating visual attention and recall of multi-step instructions.  Pt demonstrated progress with gait and was able to perform higher level gait challenges without significant LOB to the R.    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, childhood, nystagmus, visual field defect, sensorineural hearing loss of bilat ears    Examination-Activity Limitations Bathing;Bend;Dressing;Locomotion Level;Stairs;Stand;Reach Overhead;Transfers    Examination-Participation Restrictions Community Activity;School    Stability/Clinical Decision Making Evolving/Moderate complexity    Rehab Potential Good    PT Frequency 1x / week    PT Duration 8 weeks    PT Treatment/Interventions ADLs/Self Care Home Management;Aquatic Therapy;Canalith Repostioning;DME Instruction;Gait training;Stair training;Functional mobility training;Patient/family education;Therapeutic activities;Orthotic Fit/Training;Therapeutic exercise;Balance training;Neuromuscular re-education;Vestibular    PT Next Visit Plan Check LTG, recert for more visits - practice curb with rollator.  Do pool again?  Balance in more wide open spaces    Consulted and Agree with Plan of Care Patient;Family member/caregiver    Family Member Consulted Mother             Patient will benefit from skilled therapeutic intervention in order to improve the following deficits and impairments:  Abnormal gait, Decreased coordination, Difficulty walking, Dizziness, Decreased activity tolerance, Decreased balance, Decreased strength, Impaired sensation, Pain  Visit Diagnosis: Muscle weakness (generalized)  Other lack of coordination  Other abnormalities of  gait and mobility  Repeated falls  Unsteadiness on feet     Problem List Patient Active Problem List   Diagnosis Date Noted   Mild cognitive impairment 09/04/2020   Diabetic gastroparesis ([place]) 09/04/2020   Acute CVA (cerebrovascular accident) ([place]) 09/03/2020   Impulsiveness 10/14/2019   Mild episode of recurrent major depressive disorder ([place]) 09/04/2019   Anxiety 08/16/2019   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   Status post radiation therapy 11/22/2018   Seasonal and perennial allergic rhinitis 11/22/2018   Seasonal allergic conjunctivitis 11/22/2018   Acute sinusitis 11/22/2018   Keratosis pilaris 10/05/2017   Melanocytic nevi of trunk 10/05/2017   Adverse food reaction 02/09/2017   Gastroesophageal reflux disease 02/09/2017   Mild intermittent asthma with acute exacerbation 11/19/2015   Mild persistent asthma without complication 91/79/1505   Acute seasonal allergic rhinitis due to pollen 11/16/2015   Anaphylactic shock due to adverse food reaction 11/16/2015   Medulloblastoma ([place]) 11/16/2015   Type 1 diabetes mellitus without complication ([place]) 69/79/4801   Dry mouth 11/16/2015   Xerosis cutis 11/16/2015   Nystagmus 08/15/2014   Visual field defect 08/15/2014   Disequilibrium 07/25/2014   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    Rico Junker, PT, DPT 10/22/20    11:30 AM    [facility] [address] [place] [place], [name], [address] Phone: [phone]   Fax:  [phone]  Name: Tracey Perkins MRN: [id number] Date of Birth: Oct 17, 1985

## 2020-10-26 ENCOUNTER — Other Ambulatory Visit: Payer: Self-pay

## 2020-10-26 ENCOUNTER — Ambulatory Visit: Payer: Medicaid Other | Admitting: Physical Therapy

## 2020-10-26 DIAGNOSIS — R2689 Other abnormalities of gait and mobility: Secondary | ICD-10-CM

## 2020-10-26 DIAGNOSIS — R2681 Unsteadiness on feet: Secondary | ICD-10-CM

## 2020-10-26 DIAGNOSIS — R296 Repeated falls: Secondary | ICD-10-CM

## 2020-10-26 DIAGNOSIS — M6281 Muscle weakness (generalized): Secondary | ICD-10-CM | POA: Diagnosis not present

## 2020-10-26 DIAGNOSIS — R278 Other lack of coordination: Secondary | ICD-10-CM

## 2020-10-26 NOTE — Therapy (Signed)
[facility] [address] [place], [name], [address] Phone: [phone]   Fax:  [phone]  Physical Therapy Treatment  Patient Details  Name: Tracey Perkins MRN: [id number] Date of Birth: January 15, 1986 Referring Provider (PT): Tracey Dada, MD   Encounter Date: 10/26/2020   PT End of Session - 10/26/20 1112     Visit Number 8    Number of Visits 9    Date for PT Re-Evaluation 11/03/20    Authorization Type [employer] [place] Medicaid $4 copay    Authorization Time Period 8 visits from 8/30 - 10/18    Authorization - Visit Number 7    Authorization - Number of Visits 8    PT Start Time 1020    PT Stop Time 1102    PT Time Calculation (min) 42 min    Equipment Utilized During Treatment Gait belt    Activity Tolerance Patient tolerated treatment well    Behavior During Therapy WFL for tasks assessed/performed             Past Medical History:  Diagnosis Date   Asthma    Brain tumor ([place])    Diabetes mellitus without complication ([place])    Eczema    Food allergy    Gastroparesis    Hypokalemia    Hypomagnesemia    Hypothyroid    Neuropathy    Post concussion syndrome 05/2019   Thyroid disease    Vision changes     Past Surgical History:  Procedure Laterality Date   BRAIN SURGERY     Portacath placement and removed     WISDOM TOOTH EXTRACTION      There were no vitals filed for this visit.   Subjective Assessment - 10/26/20 1026     Subjective Went to music in the park over the weekned, was very overcrowded.  Nothing new to report.  Asking how many visits she has left for the year.    Patient is accompained by: Family member   Mother, Tracey Perkins   Pertinent History asthma, eczema, food allergy, polyneuropathy, uncontrolled type 1 diabetes, gastroparesis, h/o chronic L thalamic infarct, new R thalamic infarct since 2016, lymphocytic thyroiditis, s/p radiation therapy, chemo and 2 surgical resection  for medulloblastoma, childhood, nystagmus, visual field defect, sensorineural hearing loss of bilat ears    Limitations Standing;Walking    Patient Stated Goals Improving balance and decreasing falls.    Currently in Pain? No/denies                [facility] PT Assessment - 10/26/20 1028       Assessment   Medical Diagnosis CVA    Referring Provider (PT) Tracey Dada, MD    Onset Date/Surgical Date 09/02/20    Hand Dominance Right    Prior Therapy acute and inpatient rehab at [facility]; OPPT services at this location      Precautions   Precautions Fall    Precaution Comments asthma, eczema, food allergy, polyneuropathy, uncontrolled type 1 diabetes, gastroparesis, h/o chronic L thalamic infarct, new R thalamic infarct since 2016, lymphocytic thyroiditis, s/p radiation therapy, chemo and 2 surgical resection for medulloblastoma, childhood, nystagmus, visual field defect, sensorineural hearing loss of bilat ears      Balance Screen   Has the patient fallen in the past 6 months Yes      Prior Function   Level of Independence Independent    Vocation Student    Vocation Requirements Was in [employer], looking more  toward finding a job    Leisure Writing; Geneticist, molecular   Overall Cognitive Status History of cognitive impairments - at baseline      Observation/Other Assessments   Focus on Therapeutic Outcomes (FOTO)  Not applicable - Medicaid      Ambulation/Gait   Gait velocity 2.9 ft/sec without Rollator      Standardized Balance Assessment   Standardized Balance Assessment Berg Balance Test;Dynamic Gait Index;Five Times Sit to Stand;10 meter walk test    Five times sit to stand comments  10.88 from chair, without use of UE, close supervision      Berg Balance Test   Sit to Stand Able to stand without using hands and stabilize independently    Standing Unsupported Able to stand safely 2 minutes    Sitting with Back Unsupported but Feet Supported on  Floor or Stool Able to sit safely and securely 2 minutes    Stand to Sit Sits safely with minimal use of hands    Transfers Able to transfer safely, minor use of hands    Standing Unsupported with Eyes Closed Able to stand 10 seconds safely    Standing Unsupported with Feet Together Able to place feet together independently and stand for 1 minute with supervision    From Standing, Reach Forward with Outstretched Arm Can reach forward >12 cm safely (5")    From Standing Position, Pick up Object from Perkins to pick up shoe safely and easily    From Standing Position, Turn to Look Behind Over each Shoulder Looks behind from both sides and weight shifts well    Turn 360 Degrees Able to turn 360 degrees safely but slowly    Standing Unsupported, Alternately Place Feet on Step/Stool Able to stand independently and safely and complete 8 steps in 20 seconds    Standing Unsupported, One Foot in Front Able to take small step independently and hold 30 seconds    Standing on One Leg Able to lift leg independently and hold 5-10 seconds    Total Score 49    Berg comment: 49/56; moderate risk for falls, improved from 36/56      Dynamic Gait Index   Level Surface Mild Impairment    Change in Gait Speed Mild Impairment    Gait with Horizontal Head Turns Mild Impairment    Gait with Vertical Head Turns Mild Impairment    Gait and Pivot Turn Mild Impairment    Step Over Obstacle Normal    Step Around Obstacles Normal    Steps Mild Impairment    Total Score 18    DGI comment: 18/24 without rollator                 PT Education - 10/26/20 1123     Education Details plan for next visit, D/C and visits start over in new year    Person(s) Educated Patient;Parent(s)    Methods Explanation    Comprehension Verbalized understanding              PT Short Term Goals - 10/09/20 1337       PT SHORT TERM GOAL #1   Title Patient will undergo further balance assesment with Berg/DGI as  applicable and LTG to be set    Baseline assessed today 8/30    Time 4    Period Weeks    Status Achieved    Target Date 10/09/20      PT SHORT TERM GOAL #2  Title Pt be able to perform HEP with supervision focusing on strength and standing balance    Baseline HEP established at prior POC    Time 4    Period Weeks    Status Achieved      PT SHORT TERM GOAL #3   Title Pt will demonstrate ability to perform stand pivot transfers consistently to L and R with supervision and without AD    Baseline CGA required    Time 4    Period Weeks    Status Achieved               PT Long Term Goals - 10/26/20 1114       PT LONG TERM GOAL #1   Title Patient will demonstrate ability to perform final HEP independently at home for improved strength/balance (ALL LTG DUE BY 11/03/20)    Baseline HEP established at prior POC    Time 8    Period Weeks    Status On-going      PT LONG TERM GOAL #2   Title Pt will improve BERG balance score to >/= 40/56 and will improve DGI to >/= 16/24 to indicate decreased falls risk    Baseline BERG: 36/56; DGI: 8/24 without rollator >> 49/56 and 18/24    Time 8    Period Weeks    Status Achieved      PT LONG TERM GOAL #3   Title Patient will improve 5x sit <> stand to </= 12 seconds without UE support    Baseline 14.20 seconds with UE support > 10 seconds without UE support    Time 8    Period Weeks    Status Achieved      PT LONG TERM GOAL #4   Title Patient will improve gait speed to >/= 2.62 ft/sec to demo improved community ambulator with LRAD    Baseline 2.3 ft/sec > 2.9 ft/sec without AD    Time 8    Period Weeks    Status Achieved      PT LONG TERM GOAL #5   Title Paitent will be able to ambulate >/= 500 ft with LRAD and supervision to demo improved household mobility    Baseline 500' with rollator with supervision, min A without rollator    Time 8    Period Weeks    Status Achieved                   Plan - 10/26/20 1115      Clinical Impression Statement Began assessment of progress towards LTG.  Pt is making excellent progress and has met 4/5 LTG with final goal to be assessed next sesion.  Pt was able to perform all balance testing today without UE support and without use of rollator with no significant LOB.  Will complete LTG assessment next session with update of HEP and then plan to D/C.    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, childhood, nystagmus, visual field defect, sensorineural hearing loss of bilat ears    Examination-Activity Limitations Bathing;Bend;Dressing;Locomotion Level;Stairs;Stand;Reach Overhead;Transfers    Examination-Participation Restrictions Community Activity;School    Stability/Clinical Decision Making Evolving/Moderate complexity    Rehab Potential Good    PT Frequency 1x / week    PT Duration 8 weeks  PT Treatment/Interventions ADLs/Self Care Home Management;Aquatic Therapy;Canalith Repostioning;DME Instruction;Gait training;Stair training;Functional mobility training;Patient/family education;Therapeutic activities;Orthotic Fit/Training;Therapeutic exercise;Balance training;Neuromuscular re-education;Vestibular    PT Next Visit Plan Revise HEP, review rollator on curb and D/C - have her come back in new year if needed for more balance work.  has used all 27 visits this year    Consulted and Agree with Plan of Care Patient;Family member/caregiver    Family Member Consulted Mother             Patient will benefit from skilled therapeutic intervention in order to improve the following deficits and impairments:  Abnormal gait, Decreased coordination, Difficulty walking,  Dizziness, Decreased activity tolerance, Decreased balance, Decreased strength, Impaired sensation, Pain  Visit Diagnosis: Muscle weakness (generalized)  Other lack of coordination  Other abnormalities of gait and mobility  Repeated falls  Unsteadiness on feet     Problem List Patient Active Problem List   Diagnosis Date Noted   Mild cognitive impairment 09/04/2020   Diabetic gastroparesis ([place]) 09/04/2020   Acute CVA (cerebrovascular accident) ([place]) 09/03/2020   Impulsiveness 10/14/2019   Mild episode of recurrent major depressive disorder ([place]) 09/04/2019   Anxiety 08/16/2019   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   Status post radiation therapy 11/22/2018   Seasonal and perennial allergic rhinitis 11/22/2018   Seasonal allergic conjunctivitis 11/22/2018   Acute sinusitis 11/22/2018   Keratosis pilaris 10/05/2017   Melanocytic nevi of trunk 10/05/2017   Adverse food reaction 02/09/2017   Gastroesophageal reflux disease 02/09/2017   Mild intermittent asthma with acute exacerbation 11/19/2015   Mild persistent asthma without complication 36/62/9476   Acute seasonal allergic rhinitis due to pollen 11/16/2015   Anaphylactic shock due to adverse food reaction 11/16/2015   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

## 2020-10-27 ENCOUNTER — Ambulatory Visit: Payer: Medicaid Other | Admitting: Physical Therapy

## 2020-11-03 ENCOUNTER — Ambulatory Visit: Payer: Medicaid Other | Admitting: Physical Therapy

## 2020-11-03 ENCOUNTER — Other Ambulatory Visit: Payer: Self-pay

## 2020-11-03 DIAGNOSIS — R2681 Unsteadiness on feet: Secondary | ICD-10-CM

## 2020-11-03 DIAGNOSIS — M6281 Muscle weakness (generalized): Secondary | ICD-10-CM

## 2020-11-03 DIAGNOSIS — R278 Other lack of coordination: Secondary | ICD-10-CM

## 2020-11-03 DIAGNOSIS — R2689 Other abnormalities of gait and mobility: Secondary | ICD-10-CM

## 2020-11-03 DIAGNOSIS — R296 Repeated falls: Secondary | ICD-10-CM

## 2020-11-03 NOTE — Patient Instructions (Addendum)
Access Code: [email or web address] URL: [email or web address] Date: 11/03/2020 Prepared by: Misty Stanley  Exercises Supine Hamstring Stretch with Strap - 1 x daily - 7 x weekly - 2 sets - 30 second hold Seated Gastroc Stretch with Strap - 1 x daily - 7 x weekly - 2 sets - 30 seconds hold SIT TO STAND ON A CUSHION/PILLOW - 1 x daily - 7 x weekly - 1 sets - 10 reps Standing Single Leg Stance with Counter Support - 1 x daily - 7 x weekly - 2 sets - 5 reps Narrow Stance with Unilateral Counter Support - EYES CLOSED - 1 x daily - 7 x weekly - 3 sets - 10 seconds hold - no head movement Staggered stance with Eyes Open - 1 x daily - 7 x weekly - 4 sets - 5 reps

## 2020-11-04 NOTE — Therapy (Signed)
[facility] [address] [place], [name], [address] Phone: [phone]   Fax:  [phone]  Physical Therapy Treatment  Patient Details  Name: Tracey Perkins MRN: [id number] Date of Birth: [birth year] Referring Provider (PT): Edwin Dada, MD   Encounter Date: 11/03/2020   PT End of Session - 11/04/20 1212     Visit Number 9    Number of Visits 9    Date for PT Re-Evaluation 11/03/20    Authorization Type [place] Medicaid $4 copay    Authorization Time Period 8 visits from 8/30 - 10/18    Authorization - Visit Number 8    Authorization - Number of Visits 8    PT Start Time 1230    PT Stop Time 1315    PT Time Calculation (min) 45 min    Activity Tolerance Patient tolerated treatment well    Behavior During Therapy [facility] for tasks assessed/performed             Past Medical History:  Diagnosis Date   Asthma    Brain tumor ([place])    Diabetes mellitus without complication ([place])    Eczema    Food allergy    Gastroparesis    Hypokalemia    Hypomagnesemia    Hypothyroid    Neuropathy    Post concussion syndrome 05/2019   Thyroid disease    Vision changes     Past Surgical History:  Procedure Laterality Date   BRAIN SURGERY     Portacath placement and removed     WISDOM TOOTH EXTRACTION      There were no vitals filed for this visit.   Subjective Assessment - 11/03/20 1244     Subjective Pt's sister and nephew were here from [place].  Did a lot of walking and playing with nephew and having a little bit of back pain.  Asking PT to check height of rollator.    Patient is accompained by: Family member   Mother, [name]   Pertinent History asthma, eczema, food allergy, polyneuropathy, uncontrolled type 1 diabetes, gastroparesis, h/o chronic L thalamic infarct, new R thalamic infarct since 2016, lymphocytic thyroiditis, s/p radiation therapy, chemo and 2 surgical resection for medulloblastoma,  childhood, nystagmus, visual field defect, sensorineural hearing loss of bilat ears    Limitations Standing;Walking    Patient Stated Goals Improving balance and decreasing falls.    Currently in Pain? Yes    Pain Score 5     Pain Location Back    Pain Orientation Mid    Pain Descriptors / Indicators Tightness    Pain Type Acute pain    Pain Onset Efrain Sella Adult PT Treatment/Exercise - 11/03/20 1254       Therapeutic Activites    Therapeutic Activities Other Therapeutic Activities    Other Therapeutic Activities Raised handles of rollator one level.  Pt reported improvement in strain/pull on mid back muscles and improved comfort.  Retained handles at new height.      Exercises   Exercises Other Exercises    Other Exercises  Demonstrated to patient how to perform mid back stretches with arm pulling across chest with trunk flexion and pulling arms forwards with trunk flexion.  Also discussed using tennis ball or massage ball on trigger points.  Reviewed and updated patient's HEP; made final exercise recommendations to continue to address balance impairments.  Pt return demonstrated each exercise;  discussed ways to perform safely at home.            Access Code: [email or web address] URL: [email or web address] Date: 11/03/2020 Prepared by: Misty Stanley  Exercises Supine Hamstring Stretch with Strap - 1 x daily - 7 x weekly - 2 sets - 30 second hold Seated Gastroc Stretch with Strap - 1 x daily - 7 x weekly - 2 sets - 30 seconds hold SIT TO STAND ON A CUSHION/PILLOW with feet together- 1 x daily - 7 x weekly - 1 sets - 10 reps Standing Single Leg Stance with Counter Support with trunk/pelvic rotations to L and R- 1 x daily - 7 x weekly - 2 sets - 5 reps Narrow Stance with Unilateral Counter Support - EYES CLOSED - 1 x daily - 7 x weekly - 3 sets - 10 seconds hold - no head movement Staggered stance with Eyes Open with head movements- 1 x daily - 7 x  weekly - 4 sets - 5 reps     PT Education - 11/04/20 1211     Education Details D/C today due to visits expiring; progress made, final HEP recommendations    Person(s) Educated Patient;Parent(s)    Methods Explanation;Demonstration;Handout    Comprehension Verbalized understanding;Returned demonstration              PT Short Term Goals - 10/09/20 1337       PT SHORT TERM GOAL #1   Title Patient will undergo further balance assesment with Berg/DGI as applicable and LTG to be set    Baseline assessed today 8/30    Time 4    Period Weeks    Status Achieved    Target Date 10/09/20      PT SHORT TERM GOAL #2   Title Pt be able to perform HEP with supervision focusing on strength and standing balance    Baseline HEP established at prior POC    Time 4    Period Weeks    Status Achieved      PT SHORT TERM GOAL #3   Title Pt will demonstrate ability to perform stand pivot transfers consistently to L and R with supervision and without AD    Baseline CGA required    Time 4    Period Weeks    Status Achieved               PT Long Term Goals - 11/04/20 1213       PT LONG TERM GOAL #1   Title Patient will demonstrate ability to perform final HEP independently at home for improved strength/balance (ALL LTG DUE BY 11/03/20)    Baseline HEP established at prior POC    Time 8    Period Weeks    Status Achieved      PT LONG TERM GOAL #2   Title Pt will improve BERG balance score to >/= 40/56 and will improve DGI to >/= 16/24 to indicate decreased falls risk    Baseline BERG: 36/56; DGI: 8/24 without rollator >> 49/56 and 18/24    Time 8    Period Weeks    Status Achieved      PT LONG TERM GOAL #3   Title Patient will improve 5x sit <> stand to </= 12 seconds without UE support    Baseline 14.20 seconds with UE support > 10 seconds without UE support    Time 8    Period Weeks    Status Achieved      PT LONG  TERM GOAL #4   Title Patient will improve gait speed to  >/= 2.62 ft/sec to demo improved community ambulator with LRAD    Baseline 2.3 ft/sec > 2.9 ft/sec without AD    Time 8    Period Weeks    Status Achieved      PT LONG TERM GOAL #5   Title Paitent will be able to ambulate >/= 500 ft with LRAD and supervision to demo improved household mobility    Baseline 500' with rollator with supervision, min A without rollator    Time 8    Period Weeks    Status Achieved                   Plan - 11/04/20 1213     Clinical Impression Statement Completed assessment of final LTG with revision of HEP and final recommendations for ongoing balance training after D/C.  Pt return demonstrated each exercise.  Pt has met all LTG set at evaluation and is ready for D/C today.    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, childhood, nystagmus, visual field defect, sensorineural hearing loss of bilat ears    Examination-Activity Limitations Bathing;Bend;Dressing;Locomotion Level;Stairs;Stand;Reach Overhead;Transfers    Examination-Participation Restrictions Community Activity;School    Stability/Clinical Decision Making Evolving/Moderate complexity    Rehab Potential Good    PT Frequency 1x / week    PT Duration 8 weeks    PT Treatment/Interventions ADLs/Self Care Home Management;Aquatic Therapy;Canalith Repostioning;DME Instruction;Gait training;Stair training;Functional mobility training;Patient/family education;Therapeutic activities;Orthotic Fit/Training;Therapeutic exercise;Balance training;Neuromuscular re-education;Vestibular    Consulted and Agree with Plan of Care Patient;Family member/caregiver    Family Member Consulted Mother              Patient will benefit from skilled therapeutic intervention in order to improve the following deficits and impairments:  Abnormal gait, Decreased coordination, Difficulty walking, Dizziness, Decreased activity tolerance, Decreased balance, Decreased strength, Impaired sensation, Pain  Visit Diagnosis: Muscle weakness (generalized)  Other abnormalities of gait and mobility  Other lack of coordination  Repeated falls  Unsteadiness on feet     Problem List Patient Active Problem List   Diagnosis Date Noted   Mild cognitive impairment 09/04/2020   Diabetic gastroparesis ([place]) 09/04/2020   Acute CVA (cerebrovascular accident) ([place]) 09/03/2020   Impulsiveness 10/14/2019   Mild episode of recurrent major depressive disorder ([place]) 09/04/2019   Anxiety 08/16/2019   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   Status post radiation therapy 11/22/2018   Seasonal and perennial allergic rhinitis 11/22/2018   Seasonal allergic conjunctivitis 11/22/2018   Acute sinusitis 11/22/2018   Keratosis pilaris 10/05/2017   Melanocytic nevi of trunk 10/05/2017   Adverse food reaction 02/09/2017   Gastroesophageal reflux disease 02/09/2017   Mild intermittent asthma with acute exacerbation 11/19/2015   Mild persistent asthma without complication 33/35/4562   Acute seasonal allergic rhinitis due to pollen 11/16/2015   Anaphylactic shock due to adverse food reaction 11/16/2015   Medulloblastoma ([place]) 11/16/2015   Type 1 diabetes mellitus without complication ([place]) 56/38/9373   Dry mouth 11/16/2015   Xerosis cutis 11/16/2015   Nystagmus 08/15/2014   Visual field defect 08/15/2014   Disequilibrium 07/25/2014   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   PHYSICAL THERAPY DISCHARGE SUMMARY  Visits  from Start of Care: 9  Current functional level related to goals / functional outcomes: See impression statement and LTG achievement above.    Remaining deficits: Impaired balance   Education / Equipment: HEP   Patient agrees to discharge. Patient goals were met. Patient is being discharged due to meeting the stated rehab goals.  Rico Junker, PT, DPT 11/04/20    12:17 PM   [facility] [address] [place], [name], [address] Phone: [phone]   Fax:  [phone]  Name: Tracey Perkins MRN: [id number] Date of Birth: [birth year]

## 2020-11-22 ENCOUNTER — Other Ambulatory Visit: Payer: Self-pay | Admitting: Family Medicine

## 2021-01-12 ENCOUNTER — Other Ambulatory Visit (HOSPITAL_COMMUNITY): Payer: Self-pay | Admitting: Psychiatry

## 2021-01-12 ENCOUNTER — Encounter (HOSPITAL_COMMUNITY): Payer: Self-pay | Admitting: Psychiatry

## 2021-01-12 ENCOUNTER — Telehealth (INDEPENDENT_AMBULATORY_CARE_PROVIDER_SITE_OTHER): Payer: Medicaid Other | Admitting: Psychiatry

## 2021-01-12 DIAGNOSIS — F419 Anxiety disorder, unspecified: Secondary | ICD-10-CM | POA: Diagnosis not present

## 2021-01-12 DIAGNOSIS — F330 Major depressive disorder, recurrent, mild: Secondary | ICD-10-CM

## 2021-01-12 MED ORDER — CITALOPRAM HYDROBROMIDE 10 MG PO TABS: 5.0000 mg | ORAL_TABLET | Freq: Every day | ORAL | 3 refills | Status: DC

## 2021-01-12 NOTE — Progress Notes (Signed)
[facility] MD/PA/NP OP Progress Note Virtual Visit via Video Note  I connected with [name] on 01/12/21 at  2:00 PM EST by a video enabled telemedicine application and verified that I am speaking with the correct person using two identifiers.  Location: Patient: Home Provider: Clinic   I discussed the limitations of evaluation and management by telemedicine and the availability of in person appointments. The patient expressed understanding and agreed to proceed.  I provided 30 minutes of non-face-to-face time during this encounter.        01/12/2021 2:55 PM VELMER BROADFOOT  MRN:  [id number]  Chief Complaint:  Things are good. I have anxiety every now and then" Per mother " she has been doing well"     HPI: 35 year old female seen today for follow up psychiatric evaluation.   She has a psychiatric history of depression and anxiey.  She is currently being managed on Celexa 5 mg daily. She notes that her medication is somewhat effective in managing her psychiatric conditions.  Today she well groomed, pleasant, cooperative, engaged in conversation and maintained eye contact.  She informed Probation officer that overall things have been going well. She however notes that she has anxiety every now and then. She notes that on Christmas she has an anxiety attack after plans were changed. She notes that her family had Christmas breakfast and then went into Christmas activities. She notes that she felt a sensory overload and felt that she was having a stroke. She also notes that when she has asthma or allergie exacerbation she she more anxious. She notes that these episodes have been isolate and notes that she finds her medications effective. Patient has Probation officer how she can handle sensory overload.  Provider informed patient to do sensory techniques by utilizing her 5 senses to calm down.  She endorsed understanding and agreed.    She notes that her anxiety and depression are manageable. She endorses adequate  sleep and appetite.  Today she denies SI/HI/VH, mania, or paranoia.  Patient informed Probation officer that recently she broke her wrist.  She notes that she was in a cast for a few weeks however informed writer that she is in no pain.    Patient was seen with her mother who notes that she has been doing well at home.  She informed Probation officer that she and her husband have given Caitlen more responsibility.  She notes that they have returned her credit card and has been managing her purposes.    Patient informed Probation officer that her neurologist suggested she start Ritalin for cognitive improvement.  Provider informed patient that if her neurologist is interested in starting Ritalin this is fine however suggested that he or she start medication.  She and her mother endorsed understanding and agreed.No medication changes made today. Patient will follow-up with neurology regarding Ritalin.  No other concerns at this time.  Visit Diagnosis:    ICD-10-CM   1. Anxiety  F41.9 citalopram (CELEXA) 10 MG tablet    2. Mild episode of recurrent major depressive disorder (HCC)  F33.0 citalopram (CELEXA) 10 MG tablet      Past Psychiatric History: Anxiety and depression   Past Medical History:  Past Medical History:  Diagnosis Date   Asthma    Brain tumor ([place])    Diabetes mellitus without complication ([place])    Eczema    Food allergy    Gastroparesis    Hypokalemia    Hypomagnesemia    Hypothyroid    Neuropathy  Post concussion syndrome 05/2019   Thyroid disease    Vision changes     Past Surgical History:  Procedure Laterality Date   BRAIN SURGERY     Portacath placement and removed     WISDOM TOOTH EXTRACTION      Family Psychiatric History: Maternal uncle schizophrenia and overdosed on pain pills. Paternal cousin and aunt bipolar  Family History:  Family History  Problem Relation Age of Onset   Thyroid disease Mother    Eczema Sister    Thyroid disease Sister    Thyroid disease Sister     Social  History:  Social History   Socioeconomic History   Marital status: Single    Spouse name: Not on file   Number of children: Not on file   Years of education: Not on file   Highest education level: Not on file  Occupational History   Not on file  Tobacco Use   Smoking status: Never   Smokeless tobacco: Never  Vaping Use   Vaping Use: Never used  Substance and Sexual Activity   Alcohol use: No   Drug use: No   Sexual activity: Yes    Birth control/protection: Pill  Other Topics Concern   Not on file  Social History Narrative   Not on file   Social Determinants of Health   Financial Resource Strain: Not on file  Food Insecurity: Not on file  Transportation Needs: Not on file  Physical Activity: Not on file  Stress: Not on file  Social Connections: Not on file    Allergies:  Allergies  Allergen Reactions   Banana Other (See Comments)    Mouth burning and swollen   Bactrim [Sulfamethoxazole-Trimethoprim] Hives   Doxycycline Hives   Gentian Violet Other (See Comments)    "It burns."  Per mother   Gentian Violet-Proflavine Sulfate [Triple Dye] Other (See Comments)    Mouth burn   Mold Extract [Trichophyton] Other (See Comments)    Hay Fever   Morphine And Related Hives   Other Other (See Comments) and Hives    "Hay fever, dust and pollen."  Per patient.   Vancomycin Other (See Comments)    Red man syndrome     Metabolic Disorder Labs: Lab Results  Component Value Date   HGBA1C 8.5 (H) 09/03/2020   MPG 197.25 09/03/2020   No results found for: PROLACTIN Lab Results  Component Value Date   CHOL 215 (H) 09/03/2020   TRIG 198 (H) 09/03/2020   HDL 40 (L) 09/03/2020   CHOLHDL 5.4 09/03/2020   VLDL 40 09/03/2020   LDLCALC 135 (H) 09/03/2020   No results found for: TSH  Therapeutic Level Labs: No results found for: LITHIUM No results found for: VALPROATE No components found for:  CBMZ  Current Medications: Current Outpatient Medications  Medication  Sig Dispense Refill   acetaminophen (TYLENOL) 500 MG tablet Take 500 mg by mouth every 6 (six) hours as needed for moderate pain.      aspirin EC 81 MG tablet Take 81 mg by mouth daily. Swallow whole.     azelastine (ASTELIN) 0.1 % nasal spray Place 2 sprays into both nostrils 2 (two) times daily. (Patient taking differently: Place 2 sprays into both nostrils 2 (two) times daily as needed for rhinitis or allergies.) 30 mL 5   cetirizine (ZYRTEC) 10 MG tablet TAKE 1 TABLET BY MOUTH EVERY DAY 34 tablet 4   citalopram (CELEXA) 10 MG tablet Take 0.5 tablets (5 mg total) by mouth  daily. Patient will cut pill in half. She has a traumatic brain injury and will take half the dose. 30 tablet 3   clopidogrel (PLAVIX) 75 MG tablet Take 1 tablet (75 mg total) by mouth daily. 30 tablet 3   Continuous Blood Gluc Transmit (DEXCOM G6 TRANSMITTER) MISC USE 1 EACH EVERY 3 (THREE) MONTHS     DERMOTIC 0.01 % OIL Place 4 drops into both ears See admin instructions. Twice a week     EPINEPHrine 0.3 mg/0.3 mL IJ SOAJ injection Use as directed for severe allergic reaction. 2 each 1   fluticasone (FLONASE) 50 MCG/ACT nasal spray 2 sprays per nostril daily as needed for stuffy nose. (Patient taking differently: Place 1 spray into both nostrils daily as needed for allergies or rhinitis.) 16 g 5   fluticasone (FLOVENT HFA) 110 MCG/ACT inhaler Inhale 2 puffs into the lungs daily. Rinse, gargle and spit out after use. (Patient taking differently: Inhale 2 puffs into the lungs daily as needed (wheezing/shortness of breath). Rinse, gargle and spit out after use.) 1 each 5   gabapentin (NEURONTIN) 300 MG capsule Take 300 mg by mouth 3 (three) times daily.  3   glucagon 1 MG injection Inject 1 mg into the skin once as needed (for diabeties).     Glucagon 3 MG/DOSE POWD Place 3 mg into the nose as needed (low blood sugar).     insulin aspart (NOVOLOG) 100 UNIT/ML injection USE UP TO 130 UNITS DAILY, AS DIRECTED IN INSULIN PUMP.      insulin glargine (LANTUS) 100 UNIT/ML injection Inject 16 Units into the skin daily.     Insulin Human (INSULIN PUMP) SOLN Inject 80 each into the skin daily. Novolog Insulin Pump     KLOR-CON M10 10 MEQ tablet Take 10 mEq by mouth daily.     levothyroxine (SYNTHROID) 100 MCG tablet Take 100 mcg by mouth daily before breakfast.      loratadine (CLARITIN) 10 MG tablet Take 1 tablet (10 mg total) by mouth daily. 30 tablet 5   lubiprostone (AMITIZA) 8 MCG capsule Take 16 mcg by mouth daily with breakfast.      magnesium oxide (MAG-OX) 400 MG tablet Take 800 mg by mouth at bedtime.     meclizine (ANTIVERT) 25 MG tablet Take 25 mg by mouth 2 (two) times daily as needed for dizziness.     montelukast (SINGULAIR) 10 MG tablet TAKE 1 TABLET (10 MG TOTAL) BY MOUTH AT BEDTIME. TO PREVENT COUGHING OR WHEEZING 34 tablet 5   ondansetron (ZOFRAN) 4 MG tablet Take 1 tablet (4 mg total) by mouth every 8 (eight) hours as needed for nausea or vomiting. 12 tablet 0   pantoprazole (PROTONIX) 20 MG tablet Take 1 tablet (20 mg total) by mouth daily. 30 tablet 2   Polyethyl Glycol-Propyl Glycol (SYSTANE OP) Place 1 drop into both eyes daily as needed.     PROAIR HFA 108 (90 Base) MCG/ACT inhaler TAKE 2 PUFFS BY MOUTH EVERY 4 HOURS AS NEEDED (Patient taking differently: Inhale 2 puffs into the lungs every 6 (six) hours as needed for wheezing or shortness of breath.) 6.7 g 0   rosuvastatin (CRESTOR) 5 MG tablet Take 1 tablet (5 mg total) by mouth daily. 30 tablet 1   Saccharomyces boulardii (FLORASTOR PO) Take 1 capsule by mouth daily.     No current facility-administered medications for this visit.     Musculoskeletal: Strength & Muscle Tone:  Unable to assess due to telehealth visit Gait &  Station:  Unable to assess due to telhealth visit Patient leans: N/A  Psychiatric Specialty Exam: Review of Systems  There were no vitals taken for this visit.There is no height or weight on file to calculate BMI.  General  Appearance: Well Groomed  Eye Contact:  Good  Speech:  Clear and Coherent and Normal Rate  Volume:  Normal  Mood:  Euthymic, occasional anxiety attacks but notes that she is able to cope with  Affect:  Appropriate and Congruent  Thought Process:  Coherent, Goal Directed and Linear  Orientation:  Full (Time, Place, and Person)  Thought Content: WDL and Logical   Suicidal Thoughts:  No  Homicidal Thoughts:  No  Memory:  Immediate;   Good Recent;   Good Remote;   Good  Judgement:  Good  Insight:  Good  Psychomotor Activity:  Normal  Concentration:  Concentration: Good and Attention Span: Good  Recall:  Good  Fund of Knowledge: Good  Language: Good  Akathisia:  No  Handed:  Right  AIMS (if indicated):Not done  Assets:  Communication Skills Desire for Improvement Financial Resources/Insurance Housing Social Support  ADL's:  Intact  Cognition: WNL  Sleep:  Fair   Screenings: GAD-7    Flowsheet Row Video Visit from 07/08/2020 in [facility] Video Visit from 04/20/2020 in [facility]  Total GAD-7 Score 8 7      PHQ2-9    Flowsheet Row Video Visit from 07/08/2020 in [facility] Video Visit from 04/20/2020 in [facility] Counselor from 09/17/2019 in [facility] Nutrition [facility]/20/2014 [facility]  PHQ-2 Total Score 1 0 2 0  PHQ-9 Total Score 9 6 7  --      Flowsheet Row ED to Hosp-Admission (Discharged) from 09/02/2020 in [facility] ED from 06/03/2020 in [facility] ED from 05/27/2020 in [facility] No Risk No Risk No Risk        Assessment and Plan: Patient notes she has occasional anxiety attacks but reports that she is able to cope with it.  She informed that her neurologist is interested  in her starting Ritalin for cognitive improvement.  Provider recommended neurology start this medication.  She endorsed understanding and agreed.  No other medication changes made today.  Patient agreeable to continue medications as prescribed. 1. Anxiety  Continue- citalopram (CELEXA) 10 MG tablet; Take 0.5 tablets (5 mg total) by mouth daily. Patient will cut pill in half. She has a traumatic brain injury and will take half the dose.  Dispense: 30 tablet; Refill: 3  2. Mild episode of recurrent major depressive disorder (HCC)  Continue- citalopram (CELEXA) 10 MG tablet; Take 0.5 tablets (5 mg total) by mouth daily. Patient will cut pill in half. She has a traumatic brain injury and will take half the dose.  Dispense: 30 tablet; Refill: 3     Follow-up in 3 months Follow-up with therapy  Salley Slaughter, NP 01/12/2021, 2:55 PM

## 2021-01-16 ENCOUNTER — Emergency Department (HOSPITAL_COMMUNITY)
Admission: EM | Admit: 2021-01-16 | Discharge: 2021-01-16 | Disposition: A | Discharge status: Home / Self Care | Payer: Medicaid Other | Attending: Emergency Medicine | Admitting: Emergency Medicine

## 2021-01-16 ENCOUNTER — Other Ambulatory Visit: Payer: Self-pay

## 2021-01-16 DIAGNOSIS — R000 Tachycardia, unspecified: Secondary | ICD-10-CM | POA: Diagnosis not present

## 2021-01-16 DIAGNOSIS — R109 Unspecified abdominal pain: Secondary | ICD-10-CM | POA: Insufficient documentation

## 2021-01-16 DIAGNOSIS — J45909 Unspecified asthma, uncomplicated: Secondary | ICD-10-CM | POA: Insufficient documentation

## 2021-01-16 DIAGNOSIS — K3184 Gastroparesis: Secondary | ICD-10-CM

## 2021-01-16 DIAGNOSIS — Z7902 Long term (current) use of antithrombotics/antiplatelets: Secondary | ICD-10-CM | POA: Insufficient documentation

## 2021-01-16 DIAGNOSIS — R112 Nausea with vomiting, unspecified: Secondary | ICD-10-CM | POA: Insufficient documentation

## 2021-01-16 DIAGNOSIS — Z79899 Other long term (current) drug therapy: Secondary | ICD-10-CM | POA: Diagnosis not present

## 2021-01-16 DIAGNOSIS — E109 Type 1 diabetes mellitus without complications: Secondary | ICD-10-CM | POA: Insufficient documentation

## 2021-01-16 DIAGNOSIS — R739 Hyperglycemia, unspecified: Secondary | ICD-10-CM

## 2021-01-16 DIAGNOSIS — E1065 Type 1 diabetes mellitus with hyperglycemia: Secondary | ICD-10-CM

## 2021-01-16 DIAGNOSIS — N9489 Other specified conditions associated with female genital organs and menstrual cycle: Secondary | ICD-10-CM | POA: Insufficient documentation

## 2021-01-16 DIAGNOSIS — E039 Hypothyroidism, unspecified: Secondary | ICD-10-CM | POA: Insufficient documentation

## 2021-01-16 DIAGNOSIS — Z794 Long term (current) use of insulin: Secondary | ICD-10-CM | POA: Diagnosis not present

## 2021-01-16 DIAGNOSIS — R197 Diarrhea, unspecified: Secondary | ICD-10-CM | POA: Insufficient documentation

## 2021-01-16 DIAGNOSIS — Z7982 Long term (current) use of aspirin: Secondary | ICD-10-CM | POA: Diagnosis not present

## 2021-01-16 LAB — COMPREHENSIVE METABOLIC PANEL
ALT: 43 U/L (ref 0–44)
AST: 35 U/L (ref 15–41)
Albumin: 4 g/dL (ref 3.5–5.0)
Alkaline Phosphatase: 139 U/L — ABNORMAL HIGH (ref 38–126)
Anion gap: 10 (ref 5–15)
BUN: 13 mg/dL (ref 6–20)
CO2: 29 mmol/L (ref 22–32)
Calcium: 9.7 mg/dL (ref 8.9–10.3)
Chloride: 97 mmol/L — ABNORMAL LOW (ref 98–111)
Creatinine, Ser: 1.02 mg/dL — ABNORMAL HIGH (ref 0.44–1.00)
GFR, Estimated: >60 mL/min (ref >60)
Glucose, Bld: 335 mg/dL — ABNORMAL HIGH (ref 70–99)
Potassium: 4.6 mmol/L (ref 3.5–5.1)
Sodium: 136 mmol/L (ref 135–145)
Total Bilirubin: 0.5 mg/dL (ref 0.3–1.2)
Total Protein: 7.6 g/dL (ref 6.5–8.1)

## 2021-01-16 LAB — URINALYSIS, ROUTINE W REFLEX MICROSCOPIC
Bacteria, UA: NONE SEEN
Bilirubin Urine: NEGATIVE
Glucose, UA: >=500 mg/dL — AB
Ketones, ur: 20 mg/dL — AB
Leukocytes,Ua: NEGATIVE
Nitrite: NEGATIVE
Protein, ur: NEGATIVE mg/dL
Specific Gravity, Urine: 1.016 (ref 1.005–1.030)
pH: 6 (ref 5.0–8.0)

## 2021-01-16 LAB — CBC WITH DIFFERENTIAL/PLATELET
Abs Immature Granulocytes: 0.07 10*3/uL (ref 0.00–0.07)
Basophils Absolute: 0 10*3/uL (ref 0.0–0.1)
Basophils Relative: 0 %
Eosinophils Absolute: 0.1 10*3/uL (ref 0.0–0.5)
Eosinophils Relative: 1 %
HCT: 42.7 % (ref 36.0–46.0)
Hemoglobin: 13.9 g/dL (ref 12.0–15.0)
Immature Granulocytes: 0 %
Lymphocytes Relative: 9 %
Lymphs Abs: 1.6 10*3/uL (ref 0.7–4.0)
MCH: 29.7 pg (ref 26.0–34.0)
MCHC: 32.6 g/dL (ref 30.0–36.0)
MCV: 91.2 fL (ref 80.0–100.0)
Monocytes Absolute: 1.5 10*3/uL — ABNORMAL HIGH (ref 0.1–1.0)
Monocytes Relative: 9 %
Neutro Abs: 13.9 10*3/uL — ABNORMAL HIGH (ref 1.7–7.7)
Neutrophils Relative %: 81 %
Platelets: 489 10*3/uL — ABNORMAL HIGH (ref 150–400)
RBC: 4.68 MIL/uL (ref 3.87–5.11)
RDW: 13.2 % (ref 11.5–15.5)
WBC: 17.3 10*3/uL — ABNORMAL HIGH (ref 4.0–10.5)
nRBC: 0 % (ref 0.0–0.2)

## 2021-01-16 LAB — I-STAT VENOUS BLOOD GAS, ED
Acid-Base Excess: 5 mmol/L — ABNORMAL HIGH (ref 0.0–2.0)
Bicarbonate: 34.2 mmol/L — ABNORMAL HIGH (ref 20.0–28.0)
Calcium, Ion: 1.21 mmol/L (ref 1.15–1.40)
HCT: 45 % (ref 36.0–46.0)
Hemoglobin: 15.3 g/dL — ABNORMAL HIGH (ref 12.0–15.0)
O2 Saturation: 64 %
Potassium: 4.6 mmol/L (ref 3.5–5.1)
Sodium: 137 mmol/L (ref 135–145)
TCO2: 36 mmol/L — ABNORMAL HIGH (ref 22–32)
pCO2, Ven: 71.1 mmHg (ref 44.0–60.0)
pH, Ven: 7.289 (ref 7.250–7.430)
pO2, Ven: 39 mmHg (ref 32.0–45.0)

## 2021-01-16 LAB — CBG MONITORING, ED
Glucose-Capillary: 276 mg/dL — ABNORMAL HIGH (ref 70–99)
Glucose-Capillary: 224 mg/dL — ABNORMAL HIGH (ref 70–99)

## 2021-01-16 LAB — LIPASE, BLOOD: Lipase: 32 U/L (ref 11–51)

## 2021-01-16 LAB — I-STAT BETA HCG BLOOD, ED (MC, WL, AP ONLY): I-stat hCG, quantitative: <5 m[IU]/mL (ref <5)

## 2021-01-16 MED ORDER — METOCLOPRAMIDE HCL 5 MG/ML IJ SOLN
10.0000 mg | Freq: Once | INTRAMUSCULAR | Status: AC
  Administered 2021-01-16: 10 mg via INTRAVENOUS
  Filled 2021-01-16: qty 2

## 2021-01-16 MED ORDER — SODIUM CHLORIDE 0.9 % IV BOLUS
1000.0000 mL | Freq: Once | INTRAVENOUS | Status: AC
  Administered 2021-01-16: 1000 mL via INTRAVENOUS

## 2021-01-16 MED ORDER — METOCLOPRAMIDE HCL 10 MG PO TABS: 10.0000 mg | ORAL_TABLET | Freq: Four times a day (QID) | ORAL | 0 refills | Status: DC | PRN

## 2021-01-16 MED ORDER — ONDANSETRON HCL 4 MG/2ML IJ SOLN
4.0000 mg | Freq: Once | INTRAMUSCULAR | Status: AC
  Administered 2021-01-16: 4 mg via INTRAVENOUS
  Filled 2021-01-16: qty 2

## 2021-01-16 MED ORDER — HYDROMORPHONE HCL 1 MG/ML IJ SOLN
0.5000 mg | Freq: Once | INTRAMUSCULAR | Status: DC
  Filled 2021-01-16: qty 1

## 2021-01-16 NOTE — ED Provider Notes (Signed)
Emergency Medicine Provider Triage Evaluation Note  Tracey Perkins , a 35 y.o. female  was evaluated in triage.  Pt complains of nv and abd pain that woke her up this AM. Feels consistent with her h/o gastroparesis.  Review of Systems  Positive: Nv, abd pain Negative: fever  Physical Exam  BP 104/83    Pulse (!) 129    Temp 98.8 F (37.1 C) (Oral)    Resp 17    SpO2 98%  Gen:   Awake, no distress   Resp:  Normal effort  MSK:   Moves extremities without difficulty  Other:  Abd is soft and nontender, pt tachycardic  Medical Decision Making  Medically screening exam initiated at 6:53 AM.  Appropriate orders placed.  [name] was informed that the remainder of the evaluation will be completed by another provider, this initial triage assessment does not replace that evaluation, and the importance of remaining in the ED until their evaluation is complete.    Rodney Booze, PA-C 01/16/21 3546    Veryl Speak, MD 01/17/21 [phone]

## 2021-01-16 NOTE — ED Triage Notes (Signed)
Pt c/o acute onset of lower abdominal pain described as burning at 5 am along with NVD. Per note pt Type 1 DM. Pt states she ate butter popcorn, believes this is a gastroparesis flare up. HR 131.

## 2021-01-16 NOTE — Discharge Instructions (Addendum)
It was our pleasure to provide your ER care today - we hope that you feel better.  Drink plenty of water/fluids, stay well hydrated. Take reglan as  need for nausea.   Continue your diabetes meds. Check sugars regularly, 4x/day, and record values.   Follow up closely with your primary care doctor in the next 2-3 days.  Return to ER right away if worse, new symptoms, fevers, new or severe pain, abdominal pain, persistent vomiting, weak/fainting, trouble breathing, or other concern.

## 2021-01-16 NOTE — ED Provider Notes (Signed)
[facility] EMERGENCY DEPARTMENT Provider Note   CSN: [id number] Arrival date & time: 01/16/21  0867     History Chief Complaint  Patient presents with   Abdominal Pain    Tracey Perkins is a 35 y.o. female.  Patient with ix iddm, c/o nausea, vomiting, diarrhea, mid abd discomfort. Symptoms acute onset in past day, constant, moderate, recurrent. States similar symptoms in past due to her gastroparesis. Denies abd distension. Several loose stools. Emesis not bloody or bilious. No known ill contacts. No recent abx use. No dysuria. No  vaginal discharge or bleeding. No fever or chills. No chest pain, cough or sob. No back/flank pain. Compliant w home meds.   The history is provided by the patient, the spouse and medical records.  Abdominal Pain Associated symptoms: diarrhea and vomiting   Associated symptoms: no chest pain, no chills, no cough, no dysuria, no fever, no shortness of breath and no sore throat       Past Medical History:  Diagnosis Date   Asthma    Brain tumor ([place])    Diabetes mellitus without complication (Tracey Perkins)    Eczema    Food allergy    Gastroparesis    Hypokalemia    Hypomagnesemia    Hypothyroid    Neuropathy    Post concussion syndrome 05/2019   Thyroid disease    Vision changes     Patient Active Problem List   Diagnosis Date Noted   Mild cognitive impairment 09/04/2020   Diabetic gastroparesis ([place]) 09/04/2020   Acute CVA (cerebrovascular accident) ([place]) 09/03/2020   Impulsiveness 10/14/2019   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   Anxiety 08/16/2019   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   Status post radiation therapy 11/22/2018   Seasonal and perennial allergic rhinitis 11/22/2018   Seasonal allergic conjunctivitis 11/22/2018   Acute sinusitis 11/22/2018   Keratosis pilaris 10/05/2017   Melanocytic nevi of trunk 10/05/2017   Adverse food  reaction 02/09/2017   Gastroesophageal reflux disease 02/09/2017   Mild intermittent asthma with acute exacerbation 11/19/2015   Mild persistent asthma without complication 61/95/0932   Acute seasonal allergic rhinitis due to pollen 11/16/2015   Anaphylactic shock due to adverse food reaction 11/16/2015   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     Family History  Problem Relation Age of Onset   Thyroid disease Mother    Eczema Sister    Thyroid disease Sister    Thyroid disease Sister     Social History   Tobacco Use   Smoking status: Never   Smokeless tobacco: Never  Vaping Use   Vaping Use: Never used  Substance Use Topics   Alcohol use: No   Drug use: No    Home Medications Prior to Admission medications   Medication Sig Start Date End Date Taking? Authorizing Provider  acetaminophen (TYLENOL) 500 MG tablet Take 500 mg by mouth every 6 (six) hours as needed for moderate pain.  [provider]  aspirin EC 81 MG tablet Take 81 mg by mouth daily. Swallow whole.    [provider]  azelastine (ASTELIN) 0.1 % nasal spray Place 2 sprays into both nostrils 2 (two) times daily. Patient taking differently: Place 2 sprays into both nostrils 2 (two) times daily as needed for rhinitis or allergies. 12/25/19   Dara Hoyer, FNP  cetirizine (ZYRTEC) 10 MG tablet TAKE 1 TABLET BY MOUTH EVERY DAY 11/23/20   Ambs, Kathrine Cords, FNP  citalopram (CELEXA) 10 MG tablet TAKE 0.5 TABLETS BY MOUTH DAILY. PATIENT WILL CUT PILL IN HALF. SHE HAS A  TRAUMATIC BRAIN INJURY AND WILL TAKE HALF THE DOSE. 01/12/21   Eulis Canner E, NP  clopidogrel (PLAVIX) 75 MG tablet Take 1 tablet (75 mg total) by mouth daily. 09/08/20   Danford, Suann Larry, MD  Continuous Blood Gluc Transmit (DEXCOM G6 TRANSMITTER) MISC USE 1 EACH EVERY 3 (THREE) MONTHS 06/24/19   [provider]  DERMOTIC 0.01 % OIL Place 4 drops into both ears See admin instructions. Twice a week 05/15/20   [provider]  EPINEPHrine 0.3 mg/0.3 mL IJ SOAJ injection Use as directed for severe allergic reaction. 07/13/20   Dara Hoyer, FNP  fluticasone (FLONASE) 50 MCG/ACT nasal spray 2 sprays per nostril daily as needed for stuffy nose. Patient taking differently: Place 1 spray into both nostrils daily as needed for allergies or rhinitis. 12/25/19   Dara Hoyer, FNP  fluticasone (FLOVENT HFA) 110 MCG/ACT inhaler Inhale 2 puffs into the lungs daily. Rinse, gargle and spit out after use. Patient taking differently: Inhale 2 puffs into the lungs daily as needed (wheezing/shortness of breath). Rinse, gargle and spit out after use. 12/25/19   Dara Hoyer, FNP  gabapentin (NEURONTIN) 300 MG capsule Take 300 mg by mouth 3 (three) times daily. 01/25/17   [provider]  glucagon 1 MG injection Inject 1 mg into the skin once as needed (for diabeties). 06/30/16   [provider]  Glucagon 3 MG/DOSE POWD Place 3 mg into the nose as needed (low blood sugar).    [provider]  insulin aspart (NOVOLOG) 100 UNIT/ML injection USE UP TO 130 UNITS DAILY, AS DIRECTED IN INSULIN PUMP. 12/24/19   [provider]  insulin glargine (LANTUS) 100 UNIT/ML injection Inject 16 Units into the skin daily. 11/19/15   [provider]  Insulin Human (INSULIN PUMP) SOLN Inject 80 each into the skin daily. Novolog Insulin Pump    [provider]  KLOR-CON M10 10 MEQ tablet Take 10 mEq by mouth daily. 03/18/19   [provider]  levothyroxine  (SYNTHROID) 100 MCG tablet Take 100 mcg by mouth daily before breakfast.     [provider]  loratadine (CLARITIN) 10 MG tablet Take 1 tablet (10 mg total) by mouth daily. 09/22/20   Dara Hoyer, FNP  lubiprostone (AMITIZA) 8 MCG capsule Take 16 mcg by mouth daily with breakfast.     [provider]  magnesium oxide (MAG-OX) 400 MG tablet Take 800 mg by mouth at bedtime.    [provider]  meclizine (ANTIVERT) 25 MG tablet Take 25 mg by mouth 2 (two) times daily as needed for dizziness. 06/19/19   [provider]  montelukast (SINGULAIR) 10 MG tablet TAKE 1 TABLET (10 MG TOTAL) BY MOUTH AT BEDTIME. TO PREVENT COUGHING OR WHEEZING 10/07/20   Ambs, Kathrine Cords, FNP  ondansetron (ZOFRAN) 4 MG tablet Take 1 tablet (4  mg total) by mouth every 8 (eight) hours as needed for nausea or vomiting. 06/03/20   Domenic Moras, PA-C  pantoprazole (PROTONIX) 20 MG tablet Take 1 tablet (20 mg total) by mouth daily. 09/07/20 09/07/21  DanfordSuann Larry, MD  Polyethyl Glycol-Propyl Glycol (SYSTANE OP) Place 1 drop into both eyes daily as needed.    [provider]  PROAIR HFA 108 (90 Base) MCG/ACT inhaler TAKE 2 PUFFS BY MOUTH EVERY 4 HOURS AS NEEDED Patient taking differently: Inhale 2 puffs into the lungs every 6 (six) hours as needed for wheezing or shortness of breath. 04/01/19   Dara Hoyer, FNP  rosuvastatin (CRESTOR) 5 MG tablet Take 1 tablet (5 mg total) by mouth daily. 09/04/20   Annita Brod, MD  Saccharomyces boulardii (FLORASTOR PO) Take 1 capsule by mouth daily.    [provider]    Allergies    Banana, Bactrim [sulfamethoxazole-trimethoprim], Doxycycline, Gentian violet, Gentian violet-proflavine sulfate [triple dye], Mold extract [trichophyton], Morphine and related, Other, and Vancomycin  Review of Systems   Review of Systems  Constitutional:  Negative for chills and fever.  HENT:  Negative for sore throat.   Eyes:  Negative for redness.   Respiratory:  Negative for cough and shortness of breath.   Cardiovascular:  Negative for chest pain.  Gastrointestinal:  Positive for abdominal pain, diarrhea and vomiting.  Genitourinary:  Negative for dysuria and flank pain.  Musculoskeletal:  Negative for back pain and neck pain.  Skin:  Negative for rash.  Neurological:  Negative for headaches.  Hematological:  Does not bruise/bleed easily.  Psychiatric/Behavioral:  Negative for confusion.    Physical Exam Updated Vital Signs BP 104/83    Pulse (!) 129    Temp 98.8 F (37.1 C) (Oral)    Resp 17    SpO2 98%   Physical Exam Vitals and nursing note reviewed.  Constitutional:      Appearance: Normal appearance. She is well-developed.  HENT:     Head: Atraumatic.     Nose: Nose normal.     Mouth/Throat:     Mouth: Mucous membranes are moist.     Pharynx: Oropharynx is clear. No oropharyngeal exudate or posterior oropharyngeal erythema.  Eyes:     General: No scleral icterus.    Conjunctiva/sclera: Conjunctivae normal.  Neck:     Trachea: No tracheal deviation.  Cardiovascular:     Rate and Rhythm: Regular rhythm. Tachycardia present.     Pulses: Normal pulses.     Heart sounds: Normal heart sounds. No murmur heard.   No friction rub. No gallop.  Pulmonary:     Effort: Pulmonary effort is normal. No respiratory distress.     Breath sounds: Normal breath sounds.  Abdominal:     General: Bowel sounds are normal. There is no distension.     Palpations: Abdomen is soft. There is no mass.     Tenderness: There is no abdominal tenderness. There is no guarding.     Comments: Insulin pump site looks good/no sign of infection.   Genitourinary:    Comments: No cva tenderness.  Musculoskeletal:        General: No swelling or tenderness.     Cervical back: Normal range of motion and neck supple. No rigidity. No muscular tenderness.     Right lower leg: No edema.     Left lower leg: No edema.  Skin:    General: Skin is warm and  dry.     Findings: No  rash.  Neurological:     Mental Status: She is alert.     Comments: Alert, speech normal.   Psychiatric:        Mood and Affect: Mood normal.    ED Results / Procedures / Treatments   Labs (all labs ordered are listed, but only abnormal results are displayed) Results for orders placed or performed during the hospital encounter of 01/16/21  CBC with Differential  Result Value Ref Range   WBC 17.3 (H) 4.0 - 10.5 K/uL   RBC 4.68 3.87 - 5.11 MIL/uL   Hemoglobin 13.9 12.0 - 15.0 g/dL   HCT 42.7 36.0 - 46.0 %   MCV 91.2 80.0 - 100.0 fL   MCH 29.7 26.0 - 34.0 pg   MCHC 32.6 30.0 - 36.0 g/dL   RDW 13.2 11.5 - 15.5 %   Platelets 489 (H) 150 - 400 K/uL   nRBC 0.0 0.0 - 0.2 %   Neutrophils Relative % 81 %   Neutro Abs 13.9 (H) 1.7 - 7.7 K/uL   Lymphocytes Relative 9 %   Lymphs Abs 1.6 0.7 - 4.0 K/uL   Monocytes Relative 9 %   Monocytes Absolute 1.5 (H) 0.1 - 1.0 K/uL   Eosinophils Relative 1 %   Eosinophils Absolute 0.1 0.0 - 0.5 K/uL   Basophils Relative 0 %   Basophils Absolute 0.0 0.0 - 0.1 K/uL   Immature Granulocytes 0 %   Abs Immature Granulocytes 0.07 0.00 - 0.07 K/uL  Comprehensive metabolic panel  Result Value Ref Range   Sodium 136 135 - 145 mmol/L   Potassium 4.6 3.5 - 5.1 mmol/L   Chloride 97 (L) 98 - 111 mmol/L   CO2 29 22 - 32 mmol/L   Glucose, Bld 335 (H) 70 - 99 mg/dL   BUN 13 6 - 20 mg/dL   Creatinine, Ser 1.02 (H) 0.44 - 1.00 mg/dL   Calcium 9.7 8.9 - 10.3 mg/dL   Total Protein 7.6 6.5 - 8.1 g/dL   Albumin 4.0 3.5 - 5.0 g/dL   AST 35 15 - 41 U/L   ALT 43 0 - 44 U/L   Alkaline Phosphatase 139 (H) 38 - 126 U/L   Total Bilirubin 0.5 0.3 - 1.2 mg/dL   GFR, Estimated >60 >60 mL/min   Anion gap 10 5 - 15  Lipase, blood  Result Value Ref Range   Lipase 32 11 - 51 U/L  Urinalysis, Routine w reflex microscopic Urine, Unspecified Source  Result Value Ref Range   Color, Urine YELLOW YELLOW   APPearance CLEAR CLEAR   Specific Gravity,  Urine 1.016 1.005 - 1.030   pH 6.0 5.0 - 8.0   Glucose, UA >=500 (A) NEGATIVE mg/dL   Hgb urine dipstick LARGE (A) NEGATIVE   Bilirubin Urine NEGATIVE NEGATIVE   Ketones, ur 20 (A) NEGATIVE mg/dL   Protein, ur NEGATIVE NEGATIVE mg/dL   Nitrite NEGATIVE NEGATIVE   Leukocytes,Ua NEGATIVE NEGATIVE   RBC / HPF 11-20 0 - 5 RBC/hpf   WBC, UA 0-5 0 - 5 WBC/hpf   Bacteria, UA NONE SEEN NONE SEEN   Squamous Epithelial / LPF 0-5 0 - 5   Mucus PRESENT    Hyaline Casts, UA PRESENT   CBG monitoring, ED  Result Value Ref Range   Glucose-Capillary 276 (H) 70 - 99 mg/dL  I-Stat beta hCG blood, ED  Result Value Ref Range   I-stat hCG, quantitative <5.0 <5 mIU/mL   Comment 3  I-Stat venous blood gas, ED  Result Value Ref Range   pH, Ven 7.289 7.250 - 7.430   pCO2, Ven 71.1 (HH) 44.0 - 60.0 mmHg   pO2, Ven 39.0 32.0 - 45.0 mmHg   Bicarbonate 34.2 (H) 20.0 - 28.0 mmol/L   TCO2 36 (H) 22 - 32 mmol/L   O2 Saturation 64.0 %   Acid-Base Excess 5.0 (H) 0.0 - 2.0 mmol/L   Sodium 137 135 - 145 mmol/L   Potassium 4.6 3.5 - 5.1 mmol/L   Calcium, Ion 1.21 1.15 - 1.40 mmol/L   HCT 45.0 36.0 - 46.0 %   Hemoglobin 15.3 (H) 12.0 - 15.0 g/dL   Sample type VENOUS    Comment NOTIFIED PHYSICIAN      EKG EKG Interpretation  Date/Time:  Saturday January 16 2021 06:56:36 EST Ventricular Rate:  128 PR Interval:  144 QRS Duration: 64 QT Interval:  298 QTC Calculation: 435 R Axis:   25 Text Interpretation: Sinus tachycardia Confirmed by Lajean Saver [phone]) on 01/16/2021 9:48:31 AM  Radiology No results found.  Procedures Procedures   Medications Ordered in ED Medications  sodium chloride 0.9 % bolus 1,000 mL (has no administration in time range)  ondansetron (ZOFRAN) injection 4 mg (has no administration in time range)  HYDROmorphone (DILAUDID) injection 0.5 mg (has no administration in time range)    ED Course  I have reviewed the triage vital signs and the nursing  notes.  Pertinent labs & imaging results that were available during my care of the patient were reviewed by me and considered in my medical decision making (see chart for details).    MDM Rules/Calculators/A&P                         Iv ns bolus. Zofran iv. Dilaudid .5mg  iv.  Reviewed nursing notes and prior charts for additional history.   Labs reviewed/interpreted by me - wbc elev. Glucose high, 335. Hco3 normal. Ns bolus.   Additional ivf, ns bolus. Reglan iv.   Recheck abd soft nt.  No recurrent emesis. Po fluids.   Recheck cbg 224. Pt receiving insulin per her pump. Nausea/vomiting resolved. Tolerating po in ED. Currently hr 1-4, rr 14, pulse ox 96% room air.  No abd pain. Abd soft nt.   Pt currently appears stable for d/c.   Return precautions provided.     Final Clinical Impression(s) / ED Diagnoses Final diagnoses:  None    Rx / DC Orders ED Discharge Orders     None        Lajean Saver, MD 01/16/21 1535

## 2021-01-16 NOTE — ED Triage Notes (Signed)
Pt is accompanied by mother who is able to collaborate information provided in triage.

## 2021-01-28 LAB — ALLERGEN BANANA: Allergen Banana IgE: 0.47 kU/L — AB

## 2021-01-28 NOTE — Progress Notes (Signed)
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 Please have her continue to avoid banana and have access to an epinephrine auto-injector set. Thank you

## 2021-01-29 ENCOUNTER — Other Ambulatory Visit: Payer: Self-pay | Admitting: *Deleted

## 2021-01-29 MED ORDER — EPINEPHRINE 0.3 MG/0.3ML IJ SOAJ: INTRAMUSCULAR | 1 refills | Status: DC

## 2021-03-11 ENCOUNTER — Encounter (HOSPITAL_COMMUNITY): Payer: Self-pay | Admitting: Psychiatry

## 2021-03-11 ENCOUNTER — Telehealth (INDEPENDENT_AMBULATORY_CARE_PROVIDER_SITE_OTHER): Payer: Medicaid Other | Admitting: Psychiatry

## 2021-03-11 DIAGNOSIS — F419 Anxiety disorder, unspecified: Secondary | ICD-10-CM | POA: Diagnosis not present

## 2021-03-11 DIAGNOSIS — F330 Major depressive disorder, recurrent, mild: Secondary | ICD-10-CM

## 2021-03-11 MED ORDER — CITALOPRAM HYDROBROMIDE 10 MG PO TABS: ORAL_TABLET | ORAL | 3 refills | Status: DC

## 2021-03-11 NOTE — Progress Notes (Signed)
[facility] MD/PA/NP OP Progress Note Virtual Visit via Video Note  I connected with Tracey Perkins on 03/11/21 at  9:30 AM EST by a video enabled telemedicine application and verified that I am speaking with the correct person using two identifiers.  Location: Patient: Home Provider: Clinic   I discussed the limitations of evaluation and management by telemedicine and the availability of in person appointments. The patient expressed understanding and agreed to proceed.  I provided 30 minutes of non-face-to-face time during this encounter.        03/11/2021 9:44 AM Tracey Perkins  MRN:  [id number]  Chief Complaint:  "I'm in school now"   HPI: 36 year old female seen today for follow up psychiatric evaluation.   She has a psychiatric history of depression and anxiey.  She is currently being managed on Celexa 5 mg daily. She notes that her medication is somewhat effective in managing her psychiatric conditions.  Today she well groomed, pleasant, cooperative, engaged in conversation and maintained eye contact.  She informed Probation officer that  she started school to obtain a degree in [place] studies. Patient notes her anxiety and depression are well managed. She notes that when she has an anxiety attack she looks at a Goofy arm band and makes herself smile. She reports that that she has not had an anxiety attack in 2 weeks.  Today provider conducted a GAD-7 and patient scored a 7.provider also conducted PHQ-9 and patient scored a 3.  She endorses adequate sleep and appetite.   Today she denies SI/HI/VH, mania, or paranoia.  Patient informed Probation officer that her neurologist suggested she start Ritalin for cognitive improvement.  She reports that she has not seen her neurologist since her last visit but is still interested in starting Ritalin.  Provider informed patient that if her neurologist is interested in starting Ritalin this is fine however suggested that he or she start medication.  She endorsed  understanding and agreed.No medication changes made today. Patient will follow-up with neurology regarding Ritalin.  No other concerns at this time.  Visit Diagnosis:    ICD-10-CM   1. Anxiety  F41.9 citalopram (CELEXA) 10 MG tablet    2. Mild episode of recurrent major depressive disorder (HCC)  F33.0 citalopram (CELEXA) 10 MG tablet      Past Psychiatric History: Anxiety and depression   Past Medical History:  Past Medical History:  Diagnosis Date   Asthma    Brain tumor ([place])    Diabetes mellitus without complication ([place])    Eczema    Food allergy    Gastroparesis    Hypokalemia    Hypomagnesemia    Hypothyroid    Neuropathy    Post concussion syndrome 05/2019   Thyroid disease    Vision changes     Past Surgical History:  Procedure Laterality Date   BRAIN SURGERY     Portacath placement and removed     WISDOM TOOTH EXTRACTION      Family Psychiatric History: Maternal uncle schizophrenia and overdosed on pain pills. Paternal cousin and aunt bipolar  Family History:  Family History  Problem Relation Age of Onset   Thyroid disease Mother    Eczema Sister    Thyroid disease Sister    Thyroid disease Sister     Social History:  Social History   Socioeconomic History   Marital status: Single    Spouse name: Not on file   Number of children: Not on file   Years of education: Not on file  Highest education level: Not on file  Occupational History   Not on file  Tobacco Use   Smoking status: Never   Smokeless tobacco: Never  Vaping Use   Vaping Use: Never used  Substance and Sexual Activity   Alcohol use: No   Drug use: No   Sexual activity: Yes    Birth control/protection: Pill  Other Topics Concern   Not on file  Social History Narrative   Not on file   Social Determinants of Health   Financial Resource Strain: Not on file  Food Insecurity: Not on file  Transportation Needs: Not on file  Physical Activity: Not on file  Stress: Not on file   Social Connections: Not on file    Allergies:  Allergies  Allergen Reactions   Banana Other (See Comments)    Mouth burning and swollen   Bactrim [Sulfamethoxazole-Trimethoprim] Hives   Doxycycline Hives   Gentian Violet Other (See Comments)    "It burns."  Per mother   Gentian Violet-Proflavine Sulfate [Triple Dye] Other (See Comments)    Mouth burn   Mold Extract [Trichophyton] Other (See Comments)    Hay Fever   Morphine And Related Hives   Other Other (See Comments) and Hives    "Hay fever, dust and pollen."  Per patient.   Vancomycin Other (See Comments)    Red man syndrome     Metabolic Disorder Labs: Lab Results  Component Value Date   HGBA1C 8.5 (H) 09/03/2020   MPG 197.25 09/03/2020   No results found for: PROLACTIN Lab Results  Component Value Date   CHOL 215 (H) 09/03/2020   TRIG 198 (H) 09/03/2020   HDL 40 (L) 09/03/2020   CHOLHDL 5.4 09/03/2020   VLDL 40 09/03/2020   LDLCALC 135 (H) 09/03/2020   No results found for: TSH  Therapeutic Level Labs: No results found for: LITHIUM No results found for: VALPROATE No components found for:  CBMZ  Current Medications: Current Outpatient Medications  Medication Sig Dispense Refill   acetaminophen (TYLENOL) 500 MG tablet Take 500 mg by mouth every 6 (six) hours as needed for moderate pain.      aspirin EC 81 MG tablet Take 81 mg by mouth daily. Swallow whole.     azelastine (ASTELIN) 0.1 % nasal spray Place 2 sprays into both nostrils 2 (two) times daily. (Patient taking differently: Place 2 sprays into both nostrils 2 (two) times daily as needed for rhinitis or allergies.) 30 mL 5   citalopram (CELEXA) 10 MG tablet TAKE 0.5 TABLETS BY MOUTH DAILY. PATIENT WILL CUT PILL IN HALF. SHE HAS A TRAUMATIC BRAIN INJURY AND WILL TAKE HALF THE DOSE. 45 tablet 3   Continuous Blood Gluc Transmit (DEXCOM G6 TRANSMITTER) MISC USE 1 EACH EVERY 3 (THREE) MONTHS     DERMOTIC 0.01 % OIL Place 4 drops into both ears 2 (two)  times a week. Twice a week     EPINEPHrine 0.3 mg/0.3 mL IJ SOAJ injection Use as directed for severe allergic reaction. 2 each 1   fluticasone (FLONASE) 50 MCG/ACT nasal spray 2 sprays per nostril daily as needed for stuffy nose. (Patient taking differently: Place 1 spray into both nostrils daily as needed for allergies or rhinitis.) 16 g 5   fluticasone (FLOVENT HFA) 110 MCG/ACT inhaler Inhale 2 puffs into the lungs daily. Rinse, gargle and spit out after use. (Patient taking differently: Inhale 2 puffs into the lungs daily as needed (wheezing/shortness of breath). Rinse, gargle and spit out after  use.) 1 each 5   gabapentin (NEURONTIN) 300 MG capsule Take 300 mg by mouth 2 (two) times daily.  3   glucagon 1 MG injection Inject 1 mg into the skin once as needed (for diabeties).     Glucagon 3 MG/DOSE POWD Place 3 mg into the nose as needed (low blood sugar).     insulin aspart (NOVOLOG) 100 UNIT/ML injection USE UP TO 130 UNITS DAILY, AS DIRECTED IN INSULIN PUMP.     insulin glargine (LANTUS) 100 UNIT/ML injection Inject 16 Units into the skin daily.     Insulin Human (INSULIN PUMP) SOLN Inject 80 each into the skin daily. Novolog Insulin Pump     KLOR-CON M10 10 MEQ tablet Take 10 mEq by mouth daily.     levothyroxine (SYNTHROID) 100 MCG tablet Take 100 mcg by mouth daily before breakfast.      loratadine (CLARITIN) 10 MG tablet Take 1 tablet (10 mg total) by mouth daily. 30 tablet 5   lubiprostone (AMITIZA) 8 MCG capsule Take 16 mcg by mouth daily with breakfast.      magnesium oxide (MAG-OX) 400 MG tablet Take 800 mg by mouth at bedtime.     meclizine (ANTIVERT) 25 MG tablet Take 25 mg by mouth 2 (two) times daily as needed for dizziness.     metoCLOPramide (REGLAN) 10 MG tablet Take 1 tablet (10 mg total) by mouth every 6 (six) hours as needed for nausea or vomiting. 15 tablet 0   montelukast (SINGULAIR) 10 MG tablet TAKE 1 TABLET (10 MG TOTAL) BY MOUTH AT BEDTIME. TO PREVENT COUGHING OR  WHEEZING 34 tablet 5   ondansetron (ZOFRAN) 4 MG tablet Take 1 tablet (4 mg total) by mouth every 8 (eight) hours as needed for nausea or vomiting. 12 tablet 0   pantoprazole (PROTONIX) 20 MG tablet Take 1 tablet (20 mg total) by mouth daily. (Patient taking differently: Take 20 mg by mouth daily as needed for heartburn or indigestion.) 30 tablet 2   Polyethyl Glycol-Propyl Glycol (SYSTANE OP) Place 1 drop into both eyes daily as needed (dry eyes).     PROAIR HFA 108 (90 Base) MCG/ACT inhaler TAKE 2 PUFFS BY MOUTH EVERY 4 HOURS AS NEEDED (Patient taking differently: Inhale 2 puffs into the lungs every 6 (six) hours as needed for wheezing or shortness of breath.) 6.7 g 0   rosuvastatin (CRESTOR) 5 MG tablet Take 1 tablet (5 mg total) by mouth daily. 30 tablet 1   Saccharomyces boulardii (FLORASTOR PO) Take 1 capsule by mouth daily.     vitamin B-12 (CYANOCOBALAMIN) 1000 MCG tablet Take 1,000 mcg by mouth daily.     No current facility-administered medications for this visit.     Musculoskeletal: Strength & Muscle Tone:  Unable to assess due to telehealth visit [place]:  Unable to assess due to telhealth visit Patient leans: N/A  Psychiatric Specialty Exam: Review of Systems  There were no vitals taken for this visit.There is no height or weight on file to calculate BMI.  General Appearance: Well Groomed  Eye Contact:  Good  Speech:  Clear and Coherent and Slow  Volume:  Normal  Mood:  Euthymic, occasional anxiety attacks but notes that she is able to cope with  Affect:  Appropriate and Congruent  Thought Process:  Coherent, Goal Directed and Linear  Orientation:  Full (Time, Place, and Person)  Thought Content: WDL and Logical   Suicidal Thoughts:  No  Homicidal Thoughts:  No  Memory:  Immediate;   Good Recent;   Good Remote;   Good  Judgement:  Good  Insight:  Good  Psychomotor Activity:  Normal  Concentration:  Concentration: Good and Attention Span: Good  Recall:  Good   Fund of Knowledge: Good  Language: Good  Akathisia:  No  Handed:  Right  AIMS (if indicated):Not done  Assets:  Communication Skills Desire for Improvement Financial Resources/Insurance Housing Social Support  ADL's:  Intact  Cognition: WNL  Sleep:  Good   Screenings: GAD-7    Flowsheet Row Video Visit from 03/11/2021 in [facility] Video Visit from 07/08/2020 in [facility] Video Visit from 04/20/2020 in [facility]  Total GAD-7 Score 7 8 7       PHQ2-9    Flowsheet Row Video Visit from 03/11/2021 in [facility] Video Visit from 07/08/2020 in [facility] Video Visit from 04/20/2020 in [facility] Counselor from 09/17/2019 in [facility] Nutrition [facility]20/2014 [facility]  PHQ-2 Total Score 0 1 0 2 0  PHQ-9 Total Score 3 9 6 7  --      Flowsheet Row ED from 01/16/2021 in [place] ED to Hosp-Admission (Discharged) from 09/02/2020 in [place] ED from 06/03/2020 in [place] No Risk No Risk No Risk        Assessment and Plan: Patient notes she has occasional anxiety attacks but reports that she is able to cope with it.  She informed that she has not seen her neurologist since her last visit but notes that she is still interested in starting Ritalin for cognitive improvement.  Provider recommended neurology start this medication.  She endorsed understanding and agreed.  No other medication changes made today.  Patient agreeable to continue medications as prescribed. 1. Anxiety  Continue- citalopram (CELEXA) 10 MG tablet; Take 0.5 tablets (5 mg total) by mouth daily. Patient will cut pill in half. She has a traumatic brain injury and  will take half the dose.  Dispense: 30 tablet; Refill: 3  2. Mild episode of recurrent major depressive disorder (HCC)  Continue- citalopram (CELEXA) 10 MG tablet; Take 0.5 tablets (5 mg total) by mouth daily. Patient will cut pill in half. She has a traumatic brain injury and will take half the dose.  Dispense: 30 tablet; Refill: 3     Follow-up in 3 months Follow-up with therapy  Salley Slaughter, NP 03/11/2021, 9:44 AM

## 2021-03-29 ENCOUNTER — Other Ambulatory Visit: Payer: Self-pay | Admitting: Family Medicine

## 2021-03-31 ENCOUNTER — Telehealth (HOSPITAL_COMMUNITY): Payer: Medicaid Other | Admitting: Psychiatry

## 2021-04-09 ENCOUNTER — Other Ambulatory Visit: Payer: Self-pay | Admitting: Family Medicine

## 2021-04-12 ENCOUNTER — Other Ambulatory Visit: Payer: Self-pay | Admitting: Family Medicine

## 2021-04-20 ENCOUNTER — Other Ambulatory Visit: Payer: Self-pay | Admitting: Family Medicine

## 2021-04-21 ENCOUNTER — Telehealth (HOSPITAL_COMMUNITY): Payer: Self-pay | Admitting: *Deleted

## 2021-04-21 NOTE — Telephone Encounter (Signed)
Lengthy message left on my VM re changes with her medical meds and sx and she is having a reported increase in her anxiety, mood swings and easily stressed. She is believing she needs to have a change in her medicines and would like to speak with her procvc ?

## 2021-04-22 ENCOUNTER — Telehealth (HOSPITAL_COMMUNITY): Payer: Self-pay | Admitting: *Deleted

## 2021-04-22 ENCOUNTER — Other Ambulatory Visit (HOSPITAL_COMMUNITY): Payer: Self-pay | Admitting: Psychiatry

## 2021-04-22 NOTE — Telephone Encounter (Signed)
Addendum from note dated 04/21/21: patient requesting to speak with her provider re her medical situation and how it relates to her mental health and the need for a medicine change. She has a future appt with NP [facility] on 05/26/21 but feels she needs to speak with her before this date. Will forward concern to the provider.  ?

## 2021-04-22 NOTE — Progress Notes (Signed)
Contacted patient in reference to her request to speak with a provider. No answer. Message left.  ?

## 2021-05-05 ENCOUNTER — Other Ambulatory Visit: Payer: Self-pay | Admitting: Family Medicine

## 2021-05-11 ENCOUNTER — Other Ambulatory Visit: Payer: Self-pay | Admitting: Family Medicine

## 2021-05-25 ENCOUNTER — Encounter (HOSPITAL_COMMUNITY): Payer: Self-pay | Admitting: Emergency Medicine

## 2021-05-25 ENCOUNTER — Emergency Department (HOSPITAL_COMMUNITY)
Admission: EM | Admit: 2021-05-25 | Discharge: 2021-05-25 | Disposition: A | Discharge status: Home / Self Care | Payer: Medicaid Other | Attending: Emergency Medicine | Admitting: Emergency Medicine

## 2021-05-25 DIAGNOSIS — R1012 Left upper quadrant pain: Secondary | ICD-10-CM | POA: Diagnosis not present

## 2021-05-25 DIAGNOSIS — Z7982 Long term (current) use of aspirin: Secondary | ICD-10-CM | POA: Diagnosis not present

## 2021-05-25 DIAGNOSIS — R112 Nausea with vomiting, unspecified: Secondary | ICD-10-CM | POA: Diagnosis present

## 2021-05-25 DIAGNOSIS — E109 Type 1 diabetes mellitus without complications: Secondary | ICD-10-CM | POA: Insufficient documentation

## 2021-05-25 DIAGNOSIS — R1013 Epigastric pain: Secondary | ICD-10-CM | POA: Diagnosis not present

## 2021-05-25 DIAGNOSIS — D72829 Elevated white blood cell count, unspecified: Secondary | ICD-10-CM | POA: Diagnosis not present

## 2021-05-25 DIAGNOSIS — R000 Tachycardia, unspecified: Secondary | ICD-10-CM | POA: Diagnosis not present

## 2021-05-25 DIAGNOSIS — R197 Diarrhea, unspecified: Secondary | ICD-10-CM | POA: Insufficient documentation

## 2021-05-25 DIAGNOSIS — R748 Abnormal levels of other serum enzymes: Secondary | ICD-10-CM | POA: Diagnosis not present

## 2021-05-25 DIAGNOSIS — Z794 Long term (current) use of insulin: Secondary | ICD-10-CM | POA: Insufficient documentation

## 2021-05-25 LAB — COMPREHENSIVE METABOLIC PANEL
ALT: 28 U/L (ref 0–44)
AST: 23 U/L (ref 15–41)
Albumin: 4.1 g/dL (ref 3.5–5.0)
Alkaline Phosphatase: 141 U/L — ABNORMAL HIGH (ref 38–126)
Anion gap: 11 (ref 5–15)
BUN: 11 mg/dL (ref 6–20)
CO2: 25 mmol/L (ref 22–32)
Calcium: 9.7 mg/dL (ref 8.9–10.3)
Chloride: 103 mmol/L (ref 98–111)
Creatinine, Ser: 0.92 mg/dL (ref 0.44–1.00)
GFR, Estimated: >60 mL/min (ref >60)
Glucose, Bld: 102 mg/dL — ABNORMAL HIGH (ref 70–99)
Potassium: 3.6 mmol/L (ref 3.5–5.1)
Sodium: 139 mmol/L (ref 135–145)
Total Bilirubin: 0.5 mg/dL (ref 0.3–1.2)
Total Protein: 7.6 g/dL (ref 6.5–8.1)

## 2021-05-25 LAB — CBC WITH DIFFERENTIAL/PLATELET
Abs Immature Granulocytes: 0.05 10*3/uL (ref 0.00–0.07)
Basophils Absolute: 0.1 10*3/uL (ref 0.0–0.1)
Basophils Relative: 0 %
Eosinophils Absolute: 0.1 10*3/uL (ref 0.0–0.5)
Eosinophils Relative: 1 %
HCT: 47.4 % — ABNORMAL HIGH (ref 36.0–46.0)
Hemoglobin: 14.6 g/dL (ref 12.0–15.0)
Immature Granulocytes: 0 %
Lymphocytes Relative: 15 %
Lymphs Abs: 2.5 10*3/uL (ref 0.7–4.0)
MCH: 28.9 pg (ref 26.0–34.0)
MCHC: 30.8 g/dL (ref 30.0–36.0)
MCV: 93.7 fL (ref 80.0–100.0)
Monocytes Absolute: 1.8 10*3/uL — ABNORMAL HIGH (ref 0.1–1.0)
Monocytes Relative: 11 %
Neutro Abs: 12.4 10*3/uL — ABNORMAL HIGH (ref 1.7–7.7)
Neutrophils Relative %: 73 %
Platelets: 469 10*3/uL — ABNORMAL HIGH (ref 150–400)
RBC: 5.06 MIL/uL (ref 3.87–5.11)
RDW: 14.3 % (ref 11.5–15.5)
WBC: 17 10*3/uL — ABNORMAL HIGH (ref 4.0–10.5)
nRBC: 0 % (ref 0.0–0.2)

## 2021-05-25 LAB — URINALYSIS, ROUTINE W REFLEX MICROSCOPIC
Bilirubin Urine: NEGATIVE
Glucose, UA: 50 mg/dL — AB
Ketones, ur: 20 mg/dL — AB
Nitrite: NEGATIVE
Protein, ur: NEGATIVE mg/dL
RBC / HPF: >50 RBC/hpf — ABNORMAL HIGH (ref 0–5)
Specific Gravity, Urine: 1.017 (ref 1.005–1.030)
pH: 5 (ref 5.0–8.0)

## 2021-05-25 LAB — CBG MONITORING, ED: Glucose-Capillary: 110 mg/dL — ABNORMAL HIGH (ref 70–99)

## 2021-05-25 LAB — LIPASE, BLOOD: Lipase: 32 U/L (ref 11–51)

## 2021-05-25 LAB — I-STAT BETA HCG BLOOD, ED (MC, WL, AP ONLY): I-stat hCG, quantitative: <5 m[IU]/mL (ref <5)

## 2021-05-25 MED ORDER — LOPERAMIDE HCL 2 MG PO CAPS
2.0000 mg | ORAL_CAPSULE | Freq: Once | ORAL | Status: AC
  Administered 2021-05-25: 2 mg via ORAL
  Filled 2021-05-25: qty 1

## 2021-05-25 MED ORDER — ONDANSETRON HCL 4 MG/2ML IJ SOLN
4.0000 mg | Freq: Once | INTRAMUSCULAR | Status: AC
  Administered 2021-05-25: 4 mg via INTRAVENOUS
  Filled 2021-05-25: qty 2

## 2021-05-25 MED ORDER — METOCLOPRAMIDE HCL 5 MG/ML IJ SOLN
5.0000 mg | Freq: Once | INTRAMUSCULAR | Status: AC
  Administered 2021-05-25: 5 mg via INTRAVENOUS
  Filled 2021-05-25: qty 2

## 2021-05-25 MED ORDER — SODIUM CHLORIDE 0.9 % IV BOLUS
1000.0000 mL | Freq: Once | INTRAVENOUS | Status: AC
  Administered 2021-05-25: 1000 mL via INTRAVENOUS

## 2021-05-25 NOTE — ED Provider Notes (Signed)
?[facility] ?Provider Note ? ? ?CSN: [id number] ?Arrival date & time: 05/25/21  0957 ? ?  ? ?History ? ?Chief Complaint  ?Patient presents with  ? Abdominal Pain  ? Nausea  ? Emesis  ? Diarrhea  ? ? ?Tracey Perkins is a 36 y.o. female with a past medical history significant for diabetic gastroparesis, type I diabetes, GERD, history of CVA, asthma who presents with concern for severe diarrhea, nausea, vomiting since this morning.  Patient reports that she thinks she may have eaten some bad fruit.  She denies any blood in her vomit, stool.  She denies any vaginal bleeding, vaginal pain, discharge, dysuria, hematuria.  She is in no acute distress at this time. ? ? ?Abdominal Pain ?Associated symptoms: diarrhea and vomiting   ?Emesis ?Associated symptoms: abdominal pain and diarrhea   ?Diarrhea ?Associated symptoms: abdominal pain and vomiting   ? ?  ? ?Home Medications ?Prior to Admission medications   ?Medication Sig Start Date End Date Taking? Authorizing Provider  ?acetaminophen (TYLENOL) 500 MG tablet Take 500 mg by mouth every 6 (six) hours as needed for moderate pain.     [provider]  ?aspirin EC 81 MG tablet Take 81 mg by mouth daily. Swallow whole.    [provider]  ?Azelastine HCl 137 MCG/SPRAY SOLN PLACE 2 SPRAYS INTO BOTH NOSTRILS 2 (TWO) TIMES DAILY 04/12/21   Ambs, Kathrine Cords, FNP  ?citalopram (CELEXA) 10 MG tablet TAKE 0.5 TABLETS BY MOUTH DAILY. PATIENT WILL CUT PILL IN HALF. SHE HAS A TRAUMATIC BRAIN INJURY AND WILL TAKE HALF THE DOSE. 03/11/21   Salley Slaughter, NP  ?Continuous Blood Gluc Transmit (DEXCOM G6 TRANSMITTER) MISC USE 1 EACH EVERY 3 (THREE) MONTHS 06/24/19   [provider]  ?DERMOTIC 0.01 % OIL Place 4 drops into both ears 2 (two) times a week. Twice a week 05/15/20   [provider]  ?EPINEPHrine 0.3 mg/0.3 mL IJ SOAJ injection Use as directed for severe allergic reaction. 01/29/21   Dara Hoyer, FNP  ?fluticasone  (FLONASE) 50 MCG/ACT nasal spray 2 sprays per nostril daily as needed for stuffy nose. ?Patient taking differently: Place 1 spray into both nostrils daily as needed for allergies or rhinitis. 12/25/19   Dara Hoyer, FNP  ?fluticasone (FLOVENT HFA) 110 MCG/ACT inhaler Inhale 2 puffs into the lungs daily. Rinse, gargle and spit out after use. ?Patient taking differently: Inhale 2 puffs into the lungs daily as needed (wheezing/shortness of breath). Rinse, gargle and spit out after use. 12/25/19   Dara Hoyer, FNP  ?gabapentin (NEURONTIN) 300 MG capsule Take 300 mg by mouth 2 (two) times daily. 01/25/17   [provider]  ?glucagon 1 MG injection Inject 1 mg into the skin once as needed (for diabeties). 06/30/16   [provider]  ?Glucagon 3 MG/DOSE POWD Place 3 mg into the nose as needed (low blood sugar).    [provider]  ?insulin aspart (NOVOLOG) 100 UNIT/ML injection USE UP TO 130 UNITS DAILY, AS DIRECTED IN INSULIN PUMP. 12/24/19   [provider]  ?insulin glargine (LANTUS) 100 UNIT/ML injection Inject 16 Units into the skin daily. 11/19/15   [provider]  ?Insulin Human (INSULIN PUMP) SOLN Inject 80 each into the skin daily. Novolog Insulin Pump    [provider]  ?KLOR-CON M10 10 MEQ tablet Take 10 mEq by mouth daily. 03/18/19   [provider]  ?levothyroxine (SYNTHROID) 100 MCG tablet Take  100 mcg by mouth daily before breakfast.     [provider]  ?loratadine (CLARITIN) 10 MG tablet TAKE 1 TABLET BY MOUTH EVERY DAY 04/20/21   Ambs, Kathrine Cords, FNP  ?lubiprostone (AMITIZA) 8 MCG capsule Take 16 mcg by mouth daily with breakfast.     [provider]  ?magnesium oxide (MAG-OX) 400 MG tablet Take 800 mg by mouth at bedtime.    [provider]  ?meclizine (ANTIVERT) 25 MG tablet Take 25 mg by mouth 2 (two) times daily as needed for dizziness. 06/19/19   [provider]  ?metoCLOPramide (REGLAN) 10 MG tablet Take 1 tablet  (10 mg total) by mouth every 6 (six) hours as needed for nausea or vomiting. 01/16/21   Lajean Saver, MD  ?montelukast (SINGULAIR) 10 MG tablet TAKE 1 TABLET (10 MG TOTAL) BY MOUTH AT BEDTIME. TO PREVENT COUGHING OR WHEEZING 05/11/21   Ambs, Kathrine Cords, FNP  ?ondansetron (ZOFRAN) 4 MG tablet Take 1 tablet (4 mg total) by mouth every 8 (eight) hours as needed for nausea or vomiting. 06/03/20   Domenic Moras, PA-C  ?pantoprazole (PROTONIX) 20 MG tablet Take 1 tablet (20 mg total) by mouth daily. ?Patient taking differently: Take 20 mg by mouth daily as needed for heartburn or indigestion. 09/07/20 09/07/21  Edwin Dada, MD  ?Polyethyl Glycol-Propyl Glycol (SYSTANE OP) Place 1 drop into both eyes daily as needed (dry eyes).    [provider]  ?PROAIR HFA 108 (90 Base) MCG/ACT inhaler TAKE 2 PUFFS BY MOUTH EVERY 4 HOURS AS NEEDED ?Patient taking differently: Inhale 2 puffs into the lungs every 6 (six) hours as needed for wheezing or shortness of breath. 04/01/19   Dara Hoyer, FNP  ?rosuvastatin (CRESTOR) 5 MG tablet Take 1 tablet (5 mg total) by mouth daily. 09/04/20   Annita Brod, MD  ?Saccharomyces boulardii (FLORASTOR PO) Take 1 capsule by mouth daily.    [provider]  ?vitamin B-12 (CYANOCOBALAMIN) 1000 MCG tablet Take 1,000 mcg by mouth daily.    [provider]  ?   ? ?Allergies    ?Banana, Bactrim [sulfamethoxazole-trimethoprim], Doxycycline, Gentian violet, Gentian violet-proflavine sulfate [triple dye], Mold extract [trichophyton], Morphine and related, Other, and Vancomycin   ? ?Review of Systems   ?Review of Systems  ?Gastrointestinal:  Positive for abdominal pain, diarrhea and vomiting.  ?All other systems reviewed and are negative. ? ?Physical Exam ?Updated Vital Signs ?BP 115/81 (BP Location: Left Wrist)   Pulse (!) 110   Temp 97.7 ?F (36.5 ?C) (Oral)   Resp 20   SpO2 93%  ?Physical Exam ?Vitals and nursing note reviewed.  ?Constitutional:   ?   General: She is  not in acute distress. ?   Appearance: Normal appearance.  ?HENT:  ?   Head: Normocephalic and atraumatic.  ?Eyes:  ?   General:     ?   Right eye: No discharge.     ?   Left eye: No discharge.  ?Cardiovascular:  ?   Rate and Rhythm: Regular rhythm. Tachycardia present.  ?   Heart sounds: No murmur heard. ?  No friction rub. No gallop.  ?Pulmonary:  ?   Effort: Pulmonary effort is normal.  ?   Breath sounds: Normal breath sounds.  ?Abdominal:  ?   General: Bowel sounds are normal.  ?   Palpations: Abdomen is soft.  ?   Comments: Some tenderness to palpation in the mid abdomen, no rebound, rigidity, guarding.  Normal bowel  sounds throughout.  ?Skin: ?   General: Skin is warm and dry.  ?   Capillary Refill: Capillary refill takes less than 2 seconds.  ?Neurological:  ?   Mental Status: She is alert and oriented to person, place, and time.  ?Psychiatric:     ?   Mood and Affect: Mood normal.     ?   Behavior: Behavior normal.  ? ? ?ED Results / Procedures / Treatments   ?Labs ?(all labs ordered are listed, but only abnormal results are displayed) ?Labs Reviewed  ?CBC WITH DIFFERENTIAL/PLATELET - Abnormal; Notable for the following components:  ?    Result Value  ? WBC 17.0 (*)   ? HCT 47.4 (*)   ? Platelets 469 (*)   ? Neutro Abs 12.4 (*)   ? Monocytes Absolute 1.8 (*)   ? All other components within normal limits  ?COMPREHENSIVE METABOLIC PANEL - Abnormal; Notable for the following components:  ? Glucose, Bld 102 (*)   ? Alkaline Phosphatase 141 (*)   ? All other components within normal limits  ?URINALYSIS, ROUTINE W REFLEX MICROSCOPIC - Abnormal; Notable for the following components:  ? APPearance CLOUDY (*)   ? Glucose, UA 50 (*)   ? Hgb urine dipstick LARGE (*)   ? Ketones, ur 20 (*)   ? Leukocytes,Ua TRACE (*)   ? RBC / HPF >50 (*)   ? Bacteria, UA FEW (*)   ? Non Squamous Epithelial 0-5 (*)   ? All other components within normal limits  ?CBG MONITORING, ED - Abnormal; Notable for the following components:  ?  Glucose-Capillary 110 (*)   ? All other components within normal limits  ?LIPASE, BLOOD  ?I-STAT BETA HCG BLOOD, ED ([facility], [facility], [facility] ONLY)  ? ? ?EKG ?None ? ?Radiology ?No results found. ? ?Procedures ?Procedures

## 2021-05-25 NOTE — ED Provider Triage Note (Signed)
Emergency Medicine Provider Triage Evaluation Note ? ?[name] , a 36 y.o. female  was evaluated in triage.  Pt complains of nausea, vomiting, diarrhea that began this AM.  Patient has had multiple episodes of both vomiting and diarrhea.  Patient denies chest pain, shortness of breath.  Patient has history of diabetes type 1 with history of gastroparesis.  Patient's mother is concerned that patient may have gastroparesis at this time. `. ? ?Review of Systems  ?Positive: Abdominal pain, nausea, vomiting, diarrhea ?Negative: Chest pain, shortness of breath ? ?Physical Exam  ?BP 124/82 (BP Location: Left Arm)   Pulse (!) 131   Temp 97.7 ?F (36.5 ?C) (Oral)   Resp 16   SpO2 96%  ?Gen:   Awake, no distress   ?Resp:  Normal effort  ?MSK:   Moves extremities without difficulty  ?Other:   ? ?Medical Decision Making  ?Medically screening exam initiated at 10:29 AM.  Appropriate orders placed.  [name] was informed that the remainder of the evaluation will be completed by another provider, this initial triage assessment does not replace that evaluation, and the importance of remaining in the ED until their evaluation is complete. ? ? ?  ?Dorothyann Peng, PA-C ?05/25/21 1032 ? ?

## 2021-05-25 NOTE — ED Notes (Signed)
Unable to urinate, has cup in back of chair, mother aware also will go when able to ?

## 2021-05-25 NOTE — ED Provider Notes (Signed)
Ultrasound ED Peripheral IV (Provider) ? ?Date/Time: 05/25/2021 12:58 PM ?Performed by: Tedd Sias, PA ?Authorized by: Tedd Sias, PA  ? ?Procedure details:  ?  Skin Prep: chlorhexidine gluconate   ?  Location:  Right AC ?  Angiocath:  20 G ?  Bedside Ultrasound Guided: Yes   ?  Images: not archived   ?  Patient tolerated procedure without complications: Yes   ?  Dressing applied: Yes   ?Attempt x1 ?[place] used ?Image not archived ?  ?Tedd Sias, [place] ?05/25/21 1258 ? ?  ?Jeanell Sparrow, DO ?05/25/21 1612 ? ?

## 2021-05-25 NOTE — ED Triage Notes (Signed)
Mother stated, she woke up and had severe diarrhea with N/V since this morning. She is type one diabetic ?

## 2021-05-25 NOTE — ED Notes (Signed)
Patient verbalizes understanding of discharge instructions. Opportunity for questioning and answers were provided. Armband removed by staff, pt discharged from ED.  

## 2021-05-25 NOTE — Discharge Instructions (Signed)
Drink plenty of fluids, get plenty of rest, continue to monitor your blood sugar, you can take Imodium for diarrhea, Zofran as needed for nausea.  Please return to the emergency department if your symptoms worsen, you develop fever, chills, pain with urination.  It was a pleasure taking care of you today. ?

## 2021-05-26 ENCOUNTER — Telehealth (HOSPITAL_COMMUNITY): Payer: Medicaid Other | Admitting: Psychiatry

## 2021-06-02 ENCOUNTER — Other Ambulatory Visit: Payer: Self-pay | Admitting: Family Medicine

## 2021-06-02 ENCOUNTER — Telehealth (INDEPENDENT_AMBULATORY_CARE_PROVIDER_SITE_OTHER): Payer: Medicaid Other | Admitting: Psychiatry

## 2021-06-02 DIAGNOSIS — F419 Anxiety disorder, unspecified: Secondary | ICD-10-CM | POA: Diagnosis not present

## 2021-06-02 DIAGNOSIS — F330 Major depressive disorder, recurrent, mild: Secondary | ICD-10-CM

## 2021-06-02 MED ORDER — CITALOPRAM HYDROBROMIDE 10 MG PO TABS: ORAL_TABLET | ORAL | 0 refills | Status: DC

## 2021-06-02 NOTE — Progress Notes (Signed)
BH MD/PA/NP OP Progress Note ? ?06/02/2021 1:27 PM ?Tracey Perkins  ?MRN:  [id number] ? ?Virtual Visit via Telephone Note ? ?I connected with Tracey Perkins on 06/02/21 at  1:00 PM EDT by telephone and verified that I am speaking with the correct person using two identifiers. ? ?Location: ?Patient: home ?Provider: offsite ?  ?I discussed the limitations, risks, security and privacy concerns of performing an evaluation and management service by telephone and the availability of in person appointments. I also discussed with the patient that there may be a patient responsible charge related to this service. The patient expressed understanding and agreed to proceed. ? ?  ?I discussed the assessment and treatment plan with the patient. The patient was provided an opportunity to ask questions and all were answered. The patient agreed with the plan and demonstrated an understanding of the instructions. ?  ?The patient was advised to call back or seek an in-person evaluation if the symptoms worsen or if the condition fails to improve as anticipated. ? ?I provided 5 minutes of non-face-to-face time during this encounter. ? ? ?Franne Grip, NP  ? ?Chief Complaint: Medication management ? ?HPI: Tracey Perkins is a 36 year old female presenting to [facility] behavioral health outpatient for follow-up psychiatric evaluation.  Patient is being seen by this provider as her attending psychiatric provider is unavailable to complete today's appointment.  Patient has a psychiatric history of depression and anxiety and her symptoms are managed with Celexa 5 mg daily.  Patient reports that her medication is effective with managing her symptoms and that she is medication compliant.  Patient reports that her anxiety and depression have improved since starting Celexa.  Patient denies adverse medication effects or the need for dosage adjustment today.  No medication changes today. ? ? ?Visit Diagnosis:  ?  ICD-10-CM   ?1. Anxiety  F41.9  citalopram (CELEXA) 10 MG tablet  ?  ?2. Mild episode of recurrent major depressive disorder (HCC)  F33.0 citalopram (CELEXA) 10 MG tablet  ?  ? ? ?Past Psychiatric History: Anxiety and depression ? ?Past Medical History:  ?Past Medical History:  ?Diagnosis Date  ? Asthma   ? Brain tumor ([place])   ? Diabetes mellitus without complication ([place])   ? Eczema   ? Food allergy   ? Gastroparesis   ? Hypokalemia   ? Hypomagnesemia   ? Hypothyroid   ? Neuropathy   ? Post concussion syndrome 05/2019  ? Thyroid disease   ? Vision changes   ?  ?Past Surgical History:  ?Procedure Laterality Date  ? BRAIN SURGERY    ? Portacath placement and removed    ? WISDOM TOOTH EXTRACTION    ? ? ?Family Psychiatric History: N/A ? ?Family History:  ?Family History  ?Problem Relation Age of Onset  ? Thyroid disease Mother   ? Eczema Sister   ? Thyroid disease Sister   ? Thyroid disease Sister   ? ? ?Social History:  ?Social History  ? ?Socioeconomic History  ? Marital status: Single  ?  Spouse name: Not on file  ? Number of children: Not on file  ? Years of education: Not on file  ? Highest education level: Not on file  ?Occupational History  ? Not on file  ?Tobacco Use  ? Smoking status: Never  ? Smokeless tobacco: Never  ?Vaping Use  ? Vaping Use: Never used  ?Substance and Sexual Activity  ? Alcohol use: No  ? Drug use: No  ? Sexual activity:  Yes  ?  Birth control/protection: Pill  ?Other Topics Concern  ? Not on file  ?Social History Narrative  ? Not on file  ? ?Social Determinants of Health  ? ?Financial Resource Strain: Not on file  ?Food Insecurity: Not on file  ?Transportation Needs: Not on file  ?Physical Activity: Not on file  ?Stress: Not on file  ?Social Connections: Not on file  ? ? ?Allergies:  ?Allergies  ?Allergen Reactions  ? Banana Other (See Comments)  ?  Mouth burning and swollen  ? Bactrim [Sulfamethoxazole-Trimethoprim] Hives  ? Doxycycline Hives  ? Gentian Violet Other (See Comments)  ?  "It burns."  Per mother  ? Gentian  Violet-Proflavine Sulfate [Triple Dye] Other (See Comments)  ?  Mouth burn  ? Mold Extract [Trichophyton] Other (See Comments)  ?  Hay Fever  ? Morphine And Related Hives  ? Other Other (See Comments) and Hives  ?  "Hay fever, dust and pollen."  Per patient.  ? Vancomycin Other (See Comments)  ?  Red man syndrome   ? ? ?Metabolic Disorder Labs: ?Lab Results  ?Component Value Date  ? HGBA1C 8.5 (H) 09/03/2020  ? MPG 197.25 09/03/2020  ? ?No results found for: PROLACTIN ?Lab Results  ?Component Value Date  ? CHOL 215 (H) 09/03/2020  ? TRIG 198 (H) 09/03/2020  ? HDL 40 (L) 09/03/2020  ? CHOLHDL 5.4 09/03/2020  ? VLDL 40 09/03/2020  ? LDLCALC 135 (H) 09/03/2020  ? ?No results found for: TSH ? ?Therapeutic Level Labs: ?No results found for: LITHIUM ?No results found for: VALPROATE ?No components found for:  CBMZ ? ?Current Medications: ?Current Outpatient Medications  ?Medication Sig Dispense Refill  ? acetaminophen (TYLENOL) 500 MG tablet Take 500 mg by mouth every 6 (six) hours as needed for moderate pain.     ? aspirin EC 81 MG tablet Take 81 mg by mouth daily. Swallow whole.    ? Azelastine HCl 137 MCG/SPRAY SOLN PLACE 2 SPRAYS INTO BOTH NOSTRILS 2 (TWO) TIMES DAILY 30 mL 5  ? citalopram (CELEXA) 10 MG tablet TAKE 0.5 TABLETS BY MOUTH DAILY. PATIENT WILL CUT PILL IN HALF. SHE HAS A TRAUMATIC BRAIN INJURY AND WILL TAKE HALF THE DOSE. 30 tablet 0  ? Continuous Blood Gluc Transmit (DEXCOM G6 TRANSMITTER) MISC USE 1 EACH EVERY 3 (THREE) MONTHS    ? DERMOTIC 0.01 % OIL Place 4 drops into both ears 2 (two) times a week. Twice a week    ? EPINEPHrine 0.3 mg/0.3 mL IJ SOAJ injection Use as directed for severe allergic reaction. 2 each 1  ? fluticasone (FLONASE) 50 MCG/ACT nasal spray 2 sprays per nostril daily as needed for stuffy nose. (Patient taking differently: Place 1 spray into both nostrils daily as needed for allergies or rhinitis.) 16 g 5  ? fluticasone (FLOVENT HFA) 110 MCG/ACT inhaler Inhale 2 puffs into the lungs  daily. Rinse, gargle and spit out after use. (Patient taking differently: Inhale 2 puffs into the lungs daily as needed (wheezing/shortness of breath). Rinse, gargle and spit out after use.) 1 each 5  ? gabapentin (NEURONTIN) 300 MG capsule Take 300 mg by mouth 2 (two) times daily.  3  ? glucagon 1 MG injection Inject 1 mg into the skin once as needed (for diabeties).    ? Glucagon 3 MG/DOSE POWD Place 3 mg into the nose as needed (low blood sugar).    ? insulin aspart (NOVOLOG) 100 UNIT/ML injection USE UP TO 130 UNITS DAILY, AS  DIRECTED IN INSULIN PUMP.    ? insulin glargine (LANTUS) 100 UNIT/ML injection Inject 16 Units into the skin daily.    ? Insulin Human (INSULIN PUMP) SOLN Inject 80 each into the skin daily. Novolog Insulin Pump    ? KLOR-CON M10 10 MEQ tablet Take 10 mEq by mouth daily.    ? levothyroxine (SYNTHROID) 100 MCG tablet Take 100 mcg by mouth daily before breakfast.     ? loratadine (CLARITIN) 10 MG tablet TAKE 1 TABLET BY MOUTH EVERY DAY 30 tablet 5  ? lubiprostone (AMITIZA) 8 MCG capsule Take 16 mcg by mouth daily with breakfast.     ? magnesium oxide (MAG-OX) 400 MG tablet Take 800 mg by mouth at bedtime.    ? meclizine (ANTIVERT) 25 MG tablet Take 25 mg by mouth 2 (two) times daily as needed for dizziness.    ? metoCLOPramide (REGLAN) 10 MG tablet Take 1 tablet (10 mg total) by mouth every 6 (six) hours as needed for nausea or vomiting. 15 tablet 0  ? montelukast (SINGULAIR) 10 MG tablet TAKE 1 TABLET (10 MG TOTAL) BY MOUTH AT BEDTIME. TO PREVENT COUGHING OR WHEEZING 30 tablet 0  ? ondansetron (ZOFRAN) 4 MG tablet Take 1 tablet (4 mg total) by mouth every 8 (eight) hours as needed for nausea or vomiting. 12 tablet 0  ? pantoprazole (PROTONIX) 20 MG tablet Take 1 tablet (20 mg total) by mouth daily. (Patient taking differently: Take 20 mg by mouth daily as needed for heartburn or indigestion.) 30 tablet 2  ? Polyethyl Glycol-Propyl Glycol (SYSTANE OP) Place 1 drop into both eyes daily as  needed (dry eyes).    ? PROAIR HFA 108 (90 Base) MCG/ACT inhaler TAKE 2 PUFFS BY MOUTH EVERY 4 HOURS AS NEEDED (Patient taking differently: Inhale 2 puffs into the lungs every 6 (six) hours as needed for w

## 2021-06-08 NOTE — Progress Notes (Unsigned)
   [place] [id number] Dept: [id number]  FOLLOW UP NOTE  Patient ID: Tracey Perkins, female    DOB: 08-04-1985  Age: 36 y.o. MRN: [id number] Date of Office Visit: 06/09/2021  Assessment  Chief Complaint: No chief complaint on file.  HPI Tracey Perkins is a 36 year old female who presents the clinic for follow-up visit.  She was last seen in this clinic on 09/22/2020 by Gareth Morgan, FNP, for evaluation of asthma, allergic rhinitis, allergic conjunctivitis, reflux, and food allergy to banana. Her last environmental allergy skin testing was on 11/16/2015 and was positive to pollens, mold, and dust mites Her last food allergy testing was via blood work and was positive to banana   Drug Allergies:  Allergies  Allergen Reactions   Banana Other (See Comments)    Mouth burning and swollen   Bactrim [Sulfamethoxazole-Trimethoprim] Hives   Doxycycline Hives   Gentian Violet Other (See Comments)    "It burns."  Per mother   Gentian Violet-Proflavine Sulfate [Triple Dye] Other (See Comments)    Mouth burn   Mold Extract [Trichophyton] Other (See Comments)    Hay Fever   Morphine And Related Hives   Other Other (See Comments) and Hives    "Hay fever, dust and pollen."  Per patient.   Vancomycin Other (See Comments)    Red man syndrome     Physical Exam: There were no vitals taken for this visit.   Physical Exam  Diagnostics:    Assessment and Plan: No diagnosis found.  No orders of the defined types were placed in this encounter.   There are no Patient Instructions on file for this visit.  No follow-ups on file.    Thank you for the opportunity to care for this patient.  Please do not hesitate to contact me with questions.  Gareth Morgan, FNP [facility] [place]

## 2021-06-08 NOTE — Patient Instructions (Incomplete)
Asthma Continue montelukast  10 mg once a day for coughing or wheezing Continue albuterol (ProAir, red inhaler) 2 puffs every 4 hours if needed for wheezing or coughing spells For asthma flares, begin Flovent 110 (orange inhaler)-2 puffs once a day with a spacer for 1-2 weeks or until you are cough and wheeze free. Rinse your mouth after each use  Allergic rhinitis Continue Claritin 10 mg once a day if needed for runny nose or itchy eyes.  Continue fluticasone 1 spray per nostril twice a day if needed for stuffy nose. This may help your ears.  In the right nostril, point the applicator out toward the right ear. In the left nostril, point the applicator out toward the left ear Astelin nasal spray 2 sprays in each nostril twice a day as needed for runny nose or sneezing Continue saline nasal rinses as needed for nasal symptoms. Use this before any medicated nasal sprays for best result Continue environmental control of pollens, dust mite, and mold as listed below Consider allergen immunotherapy if medications are not effective in controlling your symptoms  Allergic conjunctivitis Continue a daily lubricating eye drop such as Natural Tears or Systane as needed Some over the counter eye drops include Pataday one drop in each eye once a day as needed for red, itchy eyes OR Zaditor one drop in each eye twice a day as needed for red itchy eyes.  Reflux Restart pantoprazole as previously prescribed Follow up with your Gastroenterologist as you normally do  Adverse food reaction Continue to avoid banana. In case of an allergic reaction, take Benadryl 50 mg every 4 hours, and if life-threatening symptoms occur, inject with EpiPen 0.3 mg.  Continue other medications as noted in your chart.  Call me if this treatment plan is not working well for you  Follow up in the clinic in 6 months or sooner as needed.  Reducing Pollen Exposure The American Academy of Allergy, Asthma and Immunology suggests  the following steps to reduce your exposure to pollen during allergy seasons. Do not hang sheets or clothing out to dry; pollen may collect on these items. Do not mow lawns or spend time around freshly cut grass; mowing stirs up pollen. Keep windows closed at night.  Keep car windows closed while driving. Minimize morning activities outdoors, a time when pollen counts are usually at their highest. Stay indoors as much as possible when pollen counts or humidity is high and on windy days when pollen tends to remain in the air longer. Use air conditioning when possible.  Many air conditioners have filters that trap the pollen spores. Use a HEPA room air filter to remove pollen form the indoor air you breathe.   Control of Dust Mite Allergen Dust mites play a major role in allergic asthma and rhinitis. They occur in environments with high humidity wherever human skin is found. Dust mites absorb humidity from the atmosphere (ie, they do not drink) and feed on organic matter (including shed human and animal skin). Dust mites are a microscopic type of insect that you cannot see with the naked eye. High levels of dust mites have been detected from mattresses, pillows, carpets, upholstered furniture, bed covers, clothes, soft toys and any woven material. The principal allergen of the dust mite is found in its feces. A gram of dust may contain 1,000 mites and 250,000 fecal particles. Mite antigen is easily measured in the air during house cleaning activities. Dust mites do not bite and do not cause harm  to humans, other than by triggering allergies/asthma.  Ways to decrease your exposure to dust mites in your home:  1. Encase mattresses, box springs and pillows with a mite-impermeable barrier or cover  2. Wash sheets, blankets and drapes weekly in hot water (130 F) with detergent and dry them in a dryer on the hot setting.  3. Have the room cleaned frequently with a vacuum cleaner and a damp dust-mop. For  carpeting or rugs, vacuuming with a vacuum cleaner equipped with a high-efficiency particulate air (HEPA) filter. The dust mite allergic individual should not be in a room which is being cleaned and should wait 1 hour after cleaning before going into the room.  4. Do not sleep on upholstered furniture (eg, couches).  5. If possible removing carpeting, upholstered furniture and drapery from the home is ideal. Horizontal blinds should be eliminated in the rooms where the person spends the most time (bedroom, study, television room). Washable vinyl, roller-type shades are optimal.  6. Remove all non-washable stuffed toys from the bedroom. Wash stuffed toys weekly like sheets and blankets above.  7. Reduce indoor humidity to less than 50%. Inexpensive humidity monitors can be purchased at most hardware stores. Do not use a humidifier as can make the problem worse and are not recommended.  Control of Mold Allergen Mold and fungi can grow on a variety of surfaces provided certain temperature and moisture conditions exist.  Outdoor molds grow on plants, decaying vegetation and soil.  The major outdoor mold, Alternaria and Cladosporium, are found in very high numbers during hot and dry conditions.  Generally, a late Summer - Fall peak is seen for common outdoor fungal spores.  Rain will temporarily lower outdoor mold spore count, but counts rise rapidly when the rainy period ends.  The most important indoor molds are Aspergillus and Penicillium.  Dark, humid and poorly ventilated basements are ideal sites for mold growth.  The next most common sites of mold growth are the bathroom and the kitchen.  Outdoor [employer] Use air conditioning and keep windows closed Avoid exposure to decaying vegetation. Avoid leaf raking. Avoid grain handling. Consider wearing a face mask if working in moldy areas.  Indoor Mold Control Maintain humidity below 50%. Clean washable surfaces with 5% bleach solution. Remove  sources e.g. Contaminated carpets.

## 2021-06-09 ENCOUNTER — Other Ambulatory Visit: Payer: Self-pay | Admitting: Family Medicine

## 2021-06-09 ENCOUNTER — Ambulatory Visit: Payer: Medicaid Other | Admitting: Family Medicine

## 2021-06-09 ENCOUNTER — Encounter: Payer: Self-pay | Admitting: Family Medicine

## 2021-06-09 ENCOUNTER — Ambulatory Visit (INDEPENDENT_AMBULATORY_CARE_PROVIDER_SITE_OTHER): Payer: Medicaid Other | Admitting: Family Medicine

## 2021-06-09 VITALS — BP 104/60 | HR 105 | Resp 16

## 2021-06-09 DIAGNOSIS — J302 Other seasonal allergic rhinitis: Secondary | ICD-10-CM

## 2021-06-09 DIAGNOSIS — J4530 Mild persistent asthma, uncomplicated: Secondary | ICD-10-CM | POA: Diagnosis not present

## 2021-06-09 DIAGNOSIS — K219 Gastro-esophageal reflux disease without esophagitis: Secondary | ICD-10-CM

## 2021-06-09 DIAGNOSIS — E109 Type 1 diabetes mellitus without complications: Secondary | ICD-10-CM

## 2021-06-09 DIAGNOSIS — H1013 Acute atopic conjunctivitis, bilateral: Secondary | ICD-10-CM | POA: Diagnosis not present

## 2021-06-09 DIAGNOSIS — J3089 Other allergic rhinitis: Secondary | ICD-10-CM | POA: Diagnosis not present

## 2021-06-09 DIAGNOSIS — T781XXS Other adverse food reactions, not elsewhere classified, sequela: Secondary | ICD-10-CM

## 2021-06-09 DIAGNOSIS — H1010 Acute atopic conjunctivitis, unspecified eye: Secondary | ICD-10-CM

## 2021-06-09 MED ORDER — PROAIR HFA 108 (90 BASE) MCG/ACT IN AERS: 2.0000 | INHALATION_SPRAY | RESPIRATORY_TRACT | 0 refills | Status: DC | PRN

## 2021-06-09 MED ORDER — FLUTICASONE PROPIONATE 50 MCG/ACT NA SUSP: NASAL | 5 refills | Status: DC

## 2021-06-09 MED ORDER — AZELASTINE HCL 137 MCG/SPRAY NA SOLN: NASAL | 5 refills | Status: DC

## 2021-06-09 MED ORDER — FLUTICASONE PROPIONATE HFA 110 MCG/ACT IN AERO: INHALATION_SPRAY | RESPIRATORY_TRACT | 5 refills | Status: DC

## 2021-06-09 MED ORDER — PANTOPRAZOLE SODIUM 20 MG PO TBEC: 20.0000 mg | DELAYED_RELEASE_TABLET | Freq: Every day | ORAL | 2 refills | Status: DC

## 2021-06-09 MED ORDER — LORATADINE 10 MG PO TABS
10.0000 mg | ORAL_TABLET | Freq: Every day | ORAL | 5 refills | Status: DC | PRN
Start: 2021-06-09 — End: 2022-04-12

## 2021-07-12 ENCOUNTER — Emergency Department (HOSPITAL_BASED_OUTPATIENT_CLINIC_OR_DEPARTMENT_OTHER): Payer: Medicaid Other

## 2021-07-12 ENCOUNTER — Encounter (HOSPITAL_BASED_OUTPATIENT_CLINIC_OR_DEPARTMENT_OTHER): Payer: Self-pay

## 2021-07-12 ENCOUNTER — Inpatient Hospital Stay (HOSPITAL_BASED_OUTPATIENT_CLINIC_OR_DEPARTMENT_OTHER)
Admission: EM | Admit: 2021-07-12 | Discharge: 2021-07-14 | DRG: 641 | Disposition: A | Discharge status: Home / Self Care | Payer: Medicaid Other | Attending: Internal Medicine | Admitting: Internal Medicine

## 2021-07-12 ENCOUNTER — Other Ambulatory Visit: Payer: Self-pay

## 2021-07-12 DIAGNOSIS — E1140 Type 2 diabetes mellitus with diabetic neuropathy, unspecified: Secondary | ICD-10-CM

## 2021-07-12 DIAGNOSIS — R000 Tachycardia, unspecified: Secondary | ICD-10-CM | POA: Diagnosis present

## 2021-07-12 DIAGNOSIS — Z85841 Personal history of malignant neoplasm of brain: Secondary | ICD-10-CM

## 2021-07-12 DIAGNOSIS — A084 Viral intestinal infection, unspecified: Secondary | ICD-10-CM | POA: Diagnosis present

## 2021-07-12 DIAGNOSIS — R112 Nausea with vomiting, unspecified: Secondary | ICD-10-CM

## 2021-07-12 DIAGNOSIS — E860 Dehydration: Secondary | ICD-10-CM | POA: Diagnosis present

## 2021-07-12 DIAGNOSIS — D638 Anemia in other chronic diseases classified elsewhere: Secondary | ICD-10-CM | POA: Diagnosis present

## 2021-07-12 DIAGNOSIS — E669 Obesity, unspecified: Secondary | ICD-10-CM | POA: Diagnosis not present

## 2021-07-12 DIAGNOSIS — E109 Type 1 diabetes mellitus without complications: Secondary | ICD-10-CM

## 2021-07-12 DIAGNOSIS — E039 Hypothyroidism, unspecified: Secondary | ICD-10-CM | POA: Diagnosis present

## 2021-07-12 DIAGNOSIS — Z79890 Hormone replacement therapy (postmenopausal): Secondary | ICD-10-CM

## 2021-07-12 DIAGNOSIS — Z9641 Presence of insulin pump (external) (internal): Secondary | ICD-10-CM | POA: Diagnosis present

## 2021-07-12 DIAGNOSIS — E1043 Type 1 diabetes mellitus with diabetic autonomic (poly)neuropathy: Secondary | ICD-10-CM | POA: Diagnosis not present

## 2021-07-12 DIAGNOSIS — D75839 Thrombocytosis, unspecified: Secondary | ICD-10-CM | POA: Diagnosis present

## 2021-07-12 DIAGNOSIS — K219 Gastro-esophageal reflux disease without esophagitis: Secondary | ICD-10-CM | POA: Diagnosis present

## 2021-07-12 DIAGNOSIS — F0670 Mild neurocognitive disorder due to known physiological condition without behavioral disturbance: Secondary | ICD-10-CM | POA: Diagnosis present

## 2021-07-12 DIAGNOSIS — Z79899 Other long term (current) drug therapy: Secondary | ICD-10-CM

## 2021-07-12 DIAGNOSIS — Z7982 Long term (current) use of aspirin: Secondary | ICD-10-CM

## 2021-07-12 DIAGNOSIS — E1143 Type 2 diabetes mellitus with diabetic autonomic (poly)neuropathy: Principal | ICD-10-CM | POA: Diagnosis present

## 2021-07-12 DIAGNOSIS — E1040 Type 1 diabetes mellitus with diabetic neuropathy, unspecified: Secondary | ICD-10-CM | POA: Diagnosis not present

## 2021-07-12 DIAGNOSIS — J45909 Unspecified asthma, uncomplicated: Secondary | ICD-10-CM | POA: Diagnosis not present

## 2021-07-12 DIAGNOSIS — Z8349 Family history of other endocrine, nutritional and metabolic diseases: Secondary | ICD-10-CM

## 2021-07-12 DIAGNOSIS — Z8673 Personal history of transient ischemic attack (TIA), and cerebral infarction without residual deficits: Secondary | ICD-10-CM

## 2021-07-12 DIAGNOSIS — K3184 Gastroparesis: Secondary | ICD-10-CM | POA: Diagnosis present

## 2021-07-12 DIAGNOSIS — R197 Diarrhea, unspecified: Secondary | ICD-10-CM

## 2021-07-12 HISTORY — DX: Nausea with vomiting, unspecified: R11.2

## 2021-07-12 LAB — RAPID URINE DRUG SCREEN, HOSP PERFORMED
Amphetamines: NOT DETECTED
Barbiturates: NOT DETECTED
Benzodiazepines: NOT DETECTED
Cocaine: NOT DETECTED
Opiates: NOT DETECTED
Tetrahydrocannabinol: NOT DETECTED

## 2021-07-12 LAB — BASIC METABOLIC PANEL
Anion gap: 9 (ref 5–15)
BUN: 19 mg/dL (ref 6–20)
CO2: 27 mmol/L (ref 22–32)
Calcium: 9.1 mg/dL (ref 8.9–10.3)
Chloride: 100 mmol/L (ref 98–111)
Creatinine, Ser: 1.08 mg/dL — ABNORMAL HIGH (ref 0.44–1.00)
GFR, Estimated: >60 mL/min (ref >60)
Glucose, Bld: 184 mg/dL — ABNORMAL HIGH (ref 70–99)
Potassium: 3.9 mmol/L (ref 3.5–5.1)
Sodium: 136 mmol/L (ref 135–145)

## 2021-07-12 LAB — CBC WITH DIFFERENTIAL/PLATELET
Abs Immature Granulocytes: 0.03 10*3/uL (ref 0.00–0.07)
Basophils Absolute: 0 10*3/uL (ref 0.0–0.1)
Basophils Relative: 0 %
Eosinophils Absolute: 0.2 10*3/uL (ref 0.0–0.5)
Eosinophils Relative: 1 %
HCT: 42.1 % (ref 36.0–46.0)
Hemoglobin: 14 g/dL (ref 12.0–15.0)
Immature Granulocytes: 0 %
Lymphocytes Relative: 13 %
Lymphs Abs: 1.9 10*3/uL (ref 0.7–4.0)
MCH: 29.5 pg (ref 26.0–34.0)
MCHC: 33.3 g/dL (ref 30.0–36.0)
MCV: 88.8 fL (ref 80.0–100.0)
Monocytes Absolute: 1.5 10*3/uL — ABNORMAL HIGH (ref 0.1–1.0)
Monocytes Relative: 11 %
Neutro Abs: 10.8 10*3/uL — ABNORMAL HIGH (ref 1.7–7.7)
Neutrophils Relative %: 75 %
Platelets: 457 10*3/uL — ABNORMAL HIGH (ref 150–400)
RBC: 4.74 MIL/uL (ref 3.87–5.11)
RDW: 14 % (ref 11.5–15.5)
WBC: 14.5 10*3/uL — ABNORMAL HIGH (ref 4.0–10.5)
nRBC: 0 % (ref 0.0–0.2)

## 2021-07-12 LAB — URINALYSIS, ROUTINE W REFLEX MICROSCOPIC
Bilirubin Urine: NEGATIVE
Glucose, UA: 250 mg/dL — AB
Ketones, ur: 40 mg/dL — AB
Leukocytes,Ua: NEGATIVE
Nitrite: NEGATIVE
Protein, ur: NEGATIVE mg/dL
Specific Gravity, Urine: 1.015 (ref 1.005–1.030)
pH: 6 (ref 5.0–8.0)

## 2021-07-12 LAB — MAGNESIUM: Magnesium: 1.8 mg/dL (ref 1.7–2.4)

## 2021-07-12 LAB — PREGNANCY, URINE: Preg Test, Ur: NEGATIVE

## 2021-07-12 LAB — GLUCOSE, CAPILLARY
Glucose-Capillary: 327 mg/dL — ABNORMAL HIGH (ref 70–99)
Glucose-Capillary: 337 mg/dL — ABNORMAL HIGH (ref 70–99)

## 2021-07-12 LAB — URINALYSIS, MICROSCOPIC (REFLEX)

## 2021-07-12 LAB — D-DIMER, QUANTITATIVE: D-Dimer, Quant: 0.43 ug/mL-FEU (ref 0.00–0.50)

## 2021-07-12 MED ORDER — MONTELUKAST SODIUM 10 MG PO TABS
10.0000 mg | ORAL_TABLET | Freq: Every day | ORAL | Status: DC
  Administered 2021-07-12 – 2021-07-13 (×2): 10 mg via ORAL
  Filled 2021-07-12 (×2): qty 1

## 2021-07-12 MED ORDER — IOHEXOL 300 MG/ML  SOLN
100.0000 mL | Freq: Once | INTRAMUSCULAR | Status: AC | PRN
  Administered 2021-07-12: 100 mL via INTRAVENOUS

## 2021-07-12 MED ORDER — PROCHLORPERAZINE EDISYLATE 10 MG/2ML IJ SOLN
10.0000 mg | Freq: Once | INTRAMUSCULAR | Status: AC
  Administered 2021-07-12: 10 mg via INTRAVENOUS
  Filled 2021-07-12: qty 2

## 2021-07-12 MED ORDER — GABAPENTIN 300 MG PO CAPS
600.0000 mg | ORAL_CAPSULE | Freq: Every day | ORAL | Status: DC
  Administered 2021-07-12 – 2021-07-13 (×2): 600 mg via ORAL
  Filled 2021-07-12 (×2): qty 2

## 2021-07-12 MED ORDER — ENOXAPARIN SODIUM 40 MG/0.4ML IJ SOSY
40.0000 mg | PREFILLED_SYRINGE | INTRAMUSCULAR | Status: DC
  Administered 2021-07-12 – 2021-07-13 (×2): 40 mg via SUBCUTANEOUS
  Filled 2021-07-12 (×2): qty 0.4

## 2021-07-12 MED ORDER — GABAPENTIN 300 MG PO CAPS
300.0000 mg | ORAL_CAPSULE | Freq: Every morning | ORAL | Status: DC
  Administered 2021-07-13 – 2021-07-14 (×2): 300 mg via ORAL
  Filled 2021-07-12 (×2): qty 1

## 2021-07-12 MED ORDER — PANTOPRAZOLE SODIUM 40 MG IV SOLR
40.0000 mg | INTRAVENOUS | Status: DC
  Administered 2021-07-12 – 2021-07-13 (×2): 40 mg via INTRAVENOUS
  Filled 2021-07-12 (×2): qty 10

## 2021-07-12 MED ORDER — SODIUM CHLORIDE 0.9 % IV SOLN: INTRAVENOUS | Status: DC

## 2021-07-12 MED ORDER — POTASSIUM CHLORIDE CRYS ER 20 MEQ PO TBCR
40.0000 meq | EXTENDED_RELEASE_TABLET | Freq: Once | ORAL | Status: AC
  Administered 2021-07-12: 40 meq via ORAL
  Filled 2021-07-12: qty 2

## 2021-07-12 MED ORDER — SODIUM CHLORIDE 0.9 % IV BOLUS
1000.0000 mL | Freq: Once | INTRAVENOUS | Status: AC
  Administered 2021-07-12: 1000 mL via INTRAVENOUS

## 2021-07-12 MED ORDER — LOPERAMIDE HCL 2 MG PO CAPS
4.0000 mg | ORAL_CAPSULE | Freq: Once | ORAL | Status: AC
  Administered 2021-07-12: 4 mg via ORAL
  Filled 2021-07-12: qty 2

## 2021-07-12 MED ORDER — SODIUM CHLORIDE 0.9 % IV BOLUS
1000.0000 mL | Freq: Once | INTRAVENOUS | Status: AC
Start: 2021-07-12 — End: 2021-07-12
  Administered 2021-07-12: 1000 mL via INTRAVENOUS

## 2021-07-12 MED ORDER — INSULIN PUMP
SUBCUTANEOUS | Status: DC
  Administered 2021-07-12: 0.8 via SUBCUTANEOUS
  Administered 2021-07-13 (×2): 1.5 via SUBCUTANEOUS
  Administered 2021-07-13: 0.5 via SUBCUTANEOUS
  Administered 2021-07-13: 0.8 via SUBCUTANEOUS
  Administered 2021-07-13 (×2): 1.5 via SUBCUTANEOUS
  Administered 2021-07-14: 2 via SUBCUTANEOUS
  Administered 2021-07-14: 0.5 via SUBCUTANEOUS
  Administered 2021-07-14: 1.5 via SUBCUTANEOUS
  Filled 2021-07-12: qty 1

## 2021-07-12 MED ORDER — METOCLOPRAMIDE HCL 5 MG/ML IJ SOLN
5.0000 mg | Freq: Three times a day (TID) | INTRAMUSCULAR | Status: DC
  Administered 2021-07-12 – 2021-07-14 (×5): 5 mg via INTRAVENOUS
  Filled 2021-07-12 (×5): qty 2

## 2021-07-12 MED ORDER — ONDANSETRON HCL 4 MG/2ML IJ SOLN: 4.0000 mg | Freq: Four times a day (QID) | INTRAMUSCULAR | Status: DC | PRN

## 2021-07-12 NOTE — Progress Notes (Signed)
Patient admitted to 1402 in NAD. AOx4. VSS. Remains tachycardic. IVF running. Patient reports no pain. Patient oriented to unit and call bell. Mother at bedside.

## 2021-07-12 NOTE — ED Notes (Signed)
Patient to restroom via wheelchair.

## 2021-07-13 DIAGNOSIS — E669 Obesity, unspecified: Secondary | ICD-10-CM | POA: Diagnosis present

## 2021-07-13 DIAGNOSIS — Z79899 Other long term (current) drug therapy: Secondary | ICD-10-CM | POA: Diagnosis not present

## 2021-07-13 DIAGNOSIS — Z9641 Presence of insulin pump (external) (internal): Secondary | ICD-10-CM | POA: Diagnosis present

## 2021-07-13 DIAGNOSIS — Z79890 Hormone replacement therapy (postmenopausal): Secondary | ICD-10-CM | POA: Diagnosis not present

## 2021-07-13 DIAGNOSIS — E1065 Type 1 diabetes mellitus with hyperglycemia: Secondary | ICD-10-CM | POA: Diagnosis not present

## 2021-07-13 DIAGNOSIS — R197 Diarrhea, unspecified: Secondary | ICD-10-CM | POA: Diagnosis not present

## 2021-07-13 DIAGNOSIS — Z8673 Personal history of transient ischemic attack (TIA), and cerebral infarction without residual deficits: Secondary | ICD-10-CM | POA: Diagnosis not present

## 2021-07-13 DIAGNOSIS — E039 Hypothyroidism, unspecified: Secondary | ICD-10-CM

## 2021-07-13 DIAGNOSIS — D75839 Thrombocytosis, unspecified: Secondary | ICD-10-CM | POA: Diagnosis present

## 2021-07-13 DIAGNOSIS — D638 Anemia in other chronic diseases classified elsewhere: Secondary | ICD-10-CM | POA: Diagnosis present

## 2021-07-13 DIAGNOSIS — E1143 Type 2 diabetes mellitus with diabetic autonomic (poly)neuropathy: Secondary | ICD-10-CM

## 2021-07-13 DIAGNOSIS — A084 Viral intestinal infection, unspecified: Secondary | ICD-10-CM | POA: Diagnosis present

## 2021-07-13 DIAGNOSIS — R112 Nausea with vomiting, unspecified: Secondary | ICD-10-CM | POA: Diagnosis not present

## 2021-07-13 DIAGNOSIS — K3184 Gastroparesis: Secondary | ICD-10-CM

## 2021-07-13 DIAGNOSIS — Z85841 Personal history of malignant neoplasm of brain: Secondary | ICD-10-CM | POA: Diagnosis not present

## 2021-07-13 DIAGNOSIS — Z8349 Family history of other endocrine, nutritional and metabolic diseases: Secondary | ICD-10-CM | POA: Diagnosis not present

## 2021-07-13 DIAGNOSIS — F0670 Mild neurocognitive disorder due to known physiological condition without behavioral disturbance: Secondary | ICD-10-CM | POA: Diagnosis present

## 2021-07-13 DIAGNOSIS — E1040 Type 1 diabetes mellitus with diabetic neuropathy, unspecified: Secondary | ICD-10-CM | POA: Diagnosis present

## 2021-07-13 DIAGNOSIS — Z7982 Long term (current) use of aspirin: Secondary | ICD-10-CM | POA: Diagnosis not present

## 2021-07-13 DIAGNOSIS — K219 Gastro-esophageal reflux disease without esophagitis: Secondary | ICD-10-CM | POA: Diagnosis present

## 2021-07-13 DIAGNOSIS — J45909 Unspecified asthma, uncomplicated: Secondary | ICD-10-CM | POA: Diagnosis present

## 2021-07-13 DIAGNOSIS — E860 Dehydration: Secondary | ICD-10-CM | POA: Diagnosis present

## 2021-07-13 DIAGNOSIS — E1043 Type 1 diabetes mellitus with diabetic autonomic (poly)neuropathy: Secondary | ICD-10-CM | POA: Diagnosis present

## 2021-07-13 LAB — GLUCOSE, CAPILLARY
Glucose-Capillary: 261 mg/dL — ABNORMAL HIGH (ref 70–99)
Glucose-Capillary: 263 mg/dL — ABNORMAL HIGH (ref 70–99)
Glucose-Capillary: 248 mg/dL — ABNORMAL HIGH (ref 70–99)
Glucose-Capillary: 276 mg/dL — ABNORMAL HIGH (ref 70–99)
Glucose-Capillary: 272 mg/dL — ABNORMAL HIGH (ref 70–99)
Glucose-Capillary: 256 mg/dL — ABNORMAL HIGH (ref 70–99)

## 2021-07-13 LAB — CBC
HCT: 33.6 % — ABNORMAL LOW (ref 36.0–46.0)
Hemoglobin: 10.8 g/dL — ABNORMAL LOW (ref 12.0–15.0)
MCH: 29.1 pg (ref 26.0–34.0)
MCHC: 32.1 g/dL (ref 30.0–36.0)
MCV: 90.6 fL (ref 80.0–100.0)
Platelets: 380 10*3/uL (ref 150–400)
RBC: 3.71 MIL/uL — ABNORMAL LOW (ref 3.87–5.11)
RDW: 14.2 % (ref 11.5–15.5)
WBC: 8.6 10*3/uL (ref 4.0–10.5)
nRBC: 0 % (ref 0.0–0.2)

## 2021-07-13 LAB — C DIFFICILE QUICK SCREEN W PCR REFLEX
C Diff antigen: NEGATIVE
C Diff interpretation: NOT DETECTED
C Diff toxin: NEGATIVE

## 2021-07-13 LAB — TSH: TSH: 0.669 u[IU]/mL (ref 0.350–4.500)

## 2021-07-13 MED ORDER — ASPIRIN 81 MG PO TBEC
81.0000 mg | DELAYED_RELEASE_TABLET | Freq: Every day | ORAL | Status: DC
  Administered 2021-07-13 – 2021-07-14 (×2): 81 mg via ORAL
  Filled 2021-07-13 (×2): qty 1

## 2021-07-13 MED ORDER — LORATADINE 10 MG PO TABS
10.0000 mg | ORAL_TABLET | Freq: Every day | ORAL | Status: DC
  Administered 2021-07-13 – 2021-07-14 (×2): 10 mg via ORAL
  Filled 2021-07-13 (×2): qty 1

## 2021-07-13 MED ORDER — LACTATED RINGERS IV SOLN: INTRAVENOUS | Status: DC

## 2021-07-13 NOTE — Progress Notes (Signed)
[facility]  PROGRESS NOTE  Tracey Perkins OZH:[id number] DOB: 27-Sep-1985 DOA: 07/12/2021 PCP: Chilton Greathouse, MD   Brief HPI:   36 y.o. female with medical history significant of type 1 diabetes, diabetic gastroparesis, GERD, hypothyroidism, medulloblastoma status post resection in 1999 with mild cognitive impairment at baseline, history of severe concussion, stroke in August 2022, presented to the ED with abdominal cramping, nausea, vomiting, and diarrhea.  Patient was found to be tachycardic with heart rate in 120s, WBC 14.5.  D-dimer normal, UA clear.  Urine pregnancy test negative.  UDS negative.  Chest x-ray showed no acute findings.    Subjective   Patient seen and examined, still having diarrhea.  Still tachycardic this morning.  GI pathogen panel is pending.   Assessment/Plan:    Diarrhea -Likely viral gastroenteritis -Stool for C. difficile PCR is negative -GI pathogen panel is pending -Continue IV fluids, supportive care  Nausea and vomiting -Patient has history of gastroparesis -Resolved at this time -Patient tolerating carb modified diet  Sinus tachycardia -Secondary to dehydration -Less likely PE; D-dimer is normal -TSH 0.669 within normal range but on the lower side -She is on Synthroid 88 mcg daily -Consider reducing the dose of Synthroid to 75 mcg daily on discharge if continues to have sinus tachycardia despite resolution of diarrhea  Leukocytosis -Resolved -Likely reactive -CT abdomen/pelvis negative for acute process -Chest x-ray not suggestive of pneumonia  Type 1 diabetes mellitus with neuropathy -Continue home insulin pump  Hypothyroidism -Continue Synthroid -May need dose adjustment if continues to have sinus tachycardia  History of stroke -Continue aspirin     Medications     enoxaparin (LOVENOX) injection  40 mg Subcutaneous Q24H   gabapentin  300 mg Oral q morning   gabapentin  600 mg Oral QHS   insulin pump    Subcutaneous Q4H   loratadine  10 mg Oral Daily   metoCLOPramide (REGLAN) injection  5 mg Intravenous Q8H   montelukast  10 mg Oral QHS   pantoprazole (PROTONIX) IV  40 mg Intravenous Q24H     Data Reviewed:   CBG:  Recent Labs  Lab 07/12/21 2352 07/13/21 0400 07/13/21 0404 07/13/21 0851 07/13/21 1142  GLUCAP 327* 263* 261* 248* 276*    SpO2: 98 %    Vitals:   07/12/21 2229 07/13/21 0405 07/13/21 0849 07/13/21 1435  BP: (!) 126/96 122/90 117/80 125/81  Pulse: (!) 107 (!) 110 (!) 110 98  Resp: (!) 22 18 19 16   Temp: 98.7 F (37.1 C) 98.9 F (37.2 C) 97.9 F (36.6 C) 98.5 F (36.9 C)  TempSrc: Oral Oral Oral Oral  SpO2: 93% 94% 96% 98%      Data Reviewed:  Basic Metabolic Panel: Recent Labs  Lab 07/12/21 0757  NA 136  K 3.9  CL 100  CO2 27  GLUCOSE 184*  BUN 19  CREATININE 1.08*  CALCIUM 9.1  MG 1.8    CBC: Recent Labs  Lab 07/12/21 0757 07/13/21 0508  WBC 14.5* 8.6  NEUTROABS 10.8*  --   HGB 14.0 10.8*  HCT 42.1 33.6*  MCV 88.8 90.6  PLT 457* 380    LFT No results for input(s): "AST", "ALT", "ALKPHOS", "BILITOT", "PROT", "ALBUMIN" in the last 168 hours.   Antibiotics: Anti-infectives (From admission, onward)    None        DVT prophylaxis: Lovenox  Code Status: Full code  Family Communication: Discussed with mother at bedside   CONSULTS    Objective  Physical Examination:   General-appears in no acute distress Heart-S1-S2, regular, no murmur auscultated Lungs-clear to auscultation bilaterally, no wheezing or crackles auscultated Abdomen-soft, nontender, no organomegaly Extremities-no edema in the lower extremities Neuro-alert, oriented x3, no focal deficit noted  Status is: Inpatient: Diarrhea           Tracey Perkins S Tracey Perkins   [facility] If 7PM-7AM, please contact night-coverage at [email or web address], Office  [phone]   07/13/2021, 4:36 PM  LOS: 0 days

## 2021-07-13 NOTE — Progress Notes (Signed)
  Transition of Care [facility]) Screening Note   Patient Details  Name: Tracey Perkins Date of Birth: 02/01/1985   Transition of Care [facility]) CM/SW Contact:    Otelia Santee, LCSW Phone Number: 07/13/2021, 11:38 AM    Transition of Care Department [facility]) has reviewed patient and no TOC needs have been identified at this time. We will continue to monitor patient advancement through interdisciplinary progression rounds. If new patient transition needs arise, please place a TOC consult.

## 2021-07-14 DIAGNOSIS — E1065 Type 1 diabetes mellitus with hyperglycemia: Secondary | ICD-10-CM

## 2021-07-14 DIAGNOSIS — R112 Nausea with vomiting, unspecified: Secondary | ICD-10-CM | POA: Diagnosis not present

## 2021-07-14 DIAGNOSIS — R197 Diarrhea, unspecified: Secondary | ICD-10-CM | POA: Diagnosis not present

## 2021-07-14 DIAGNOSIS — E1143 Type 2 diabetes mellitus with diabetic autonomic (poly)neuropathy: Secondary | ICD-10-CM | POA: Diagnosis not present

## 2021-07-14 LAB — GASTROINTESTINAL PANEL BY PCR, STOOL (REPLACES STOOL CULTURE)

## 2021-07-14 LAB — COMPREHENSIVE METABOLIC PANEL
ALT: 19 U/L (ref 0–44)
AST: 14 U/L — ABNORMAL LOW (ref 15–41)
Albumin: 3.4 g/dL — ABNORMAL LOW (ref 3.5–5.0)
Alkaline Phosphatase: 113 U/L (ref 38–126)
Anion gap: 9 (ref 5–15)
BUN: 10 mg/dL (ref 6–20)
CO2: 28 mmol/L (ref 22–32)
Calcium: 9.2 mg/dL (ref 8.9–10.3)
Chloride: 104 mmol/L (ref 98–111)
Creatinine, Ser: 0.94 mg/dL (ref 0.44–1.00)
GFR, Estimated: >60 mL/min (ref >60)
Glucose, Bld: 313 mg/dL — ABNORMAL HIGH (ref 70–99)
Potassium: 3.6 mmol/L (ref 3.5–5.1)
Sodium: 141 mmol/L (ref 135–145)
Total Bilirubin: 0.7 mg/dL (ref 0.3–1.2)
Total Protein: 6.5 g/dL (ref 6.5–8.1)

## 2021-07-14 LAB — CBC
HCT: 34.6 % — ABNORMAL LOW (ref 36.0–46.0)
Hemoglobin: 11.2 g/dL — ABNORMAL LOW (ref 12.0–15.0)
MCH: 29.1 pg (ref 26.0–34.0)
MCHC: 32.4 g/dL (ref 30.0–36.0)
MCV: 89.9 fL (ref 80.0–100.0)
Platelets: 365 10*3/uL (ref 150–400)
RBC: 3.85 MIL/uL — ABNORMAL LOW (ref 3.87–5.11)
RDW: 13.9 % (ref 11.5–15.5)
WBC: 8.8 10*3/uL (ref 4.0–10.5)
nRBC: 0 % (ref 0.0–0.2)

## 2021-07-14 LAB — GLUCOSE, CAPILLARY
Glucose-Capillary: 253 mg/dL — ABNORMAL HIGH (ref 70–99)
Glucose-Capillary: 257 mg/dL — ABNORMAL HIGH (ref 70–99)

## 2021-07-14 LAB — HEMOGLOBIN A1C
Hgb A1c MFr Bld: 8.8 % — ABNORMAL HIGH (ref 4.8–5.6)
Mean Plasma Glucose: 206 mg/dL

## 2021-07-14 MED ORDER — ONDANSETRON HCL 4 MG PO TABS: 4.0000 mg | ORAL_TABLET | Freq: Three times a day (TID) | ORAL | 0 refills | Status: DC | PRN

## 2021-07-14 MED ORDER — METOCLOPRAMIDE HCL 5 MG PO TABS: 5.0000 mg | ORAL_TABLET | Freq: Three times a day (TID) | ORAL | 0 refills | Status: DC

## 2021-07-14 MED ORDER — FLUTICASONE PROPIONATE HFA 110 MCG/ACT IN AERO: 2.0000 | INHALATION_SPRAY | Freq: Two times a day (BID) | RESPIRATORY_TRACT | Status: AC | PRN

## 2021-07-14 MED ORDER — ALPRAZOLAM 0.25 MG PO TABS: 0.2500 mg | ORAL_TABLET | Freq: Three times a day (TID) | ORAL | Status: DC | PRN

## 2021-07-14 MED ORDER — PANTOPRAZOLE SODIUM 40 MG PO TBEC: 40.0000 mg | DELAYED_RELEASE_TABLET | Freq: Every day | ORAL | Status: DC

## 2021-07-14 MED ORDER — CITALOPRAM HYDROBROMIDE 10 MG PO TABS: 5.0000 mg | ORAL_TABLET | Freq: Every day | ORAL | Status: DC

## 2021-07-14 NOTE — Plan of Care (Signed)

## 2021-07-14 NOTE — Discharge Summary (Signed)
Physician Discharge Summary  Tracey Perkins AST:[id number] DOB: 02/13/1985 DOA: 07/12/2021  PCP: Prince Solian, MD  Admit date: 07/12/2021 Discharge date: 07/14/2021  Admitted From: Home Disposition: Home  Recommendations for Outpatient Follow-up:  Follow up with PCP in 1 week  Follow up in ED if symptoms worsen or new appear   Home Health: No Equipment/Devices: None  Discharge Condition: Stable CODE STATUS: Full Diet recommendation: Heart healthy/carb modified  Brief/Interim Summary: 36 y.o. female with medical history significant of type 1 diabetes, diabetic gastroparesis, GERD, hypothyroidism, medulloblastoma status post resection in 1999 with mild cognitive impairment at baseline, history of severe concussion, unspecified stroke in August 2022 presented with abdominal cramping, nausea, vomiting and diarrhea.  On presentation, she was tachycardic with leukocytosis with UA and chest x-ray negative for infection.  She was treated with IV fluids.  During the hospitalization, her condition has improved.  Her diarrhea has resolved.  She is currently tolerating diet.  She feels okay to go home today.  She will be discharged home today.  Discharge Diagnoses:   Diarrhea -Most likely from viral gastroenteritis -Stool for C. difficile negative.  Stool for GI PCR negative as well. -Diarrhea has resolved.  Currently tolerating diet. -Discharge patient home today  Nausea and vomiting -Patient has history of gastroparesis.  Currently much improved.  Treated with IV Reglan.  Continue oral Reglan on discharge.  Outpatient follow-up with PCP.  Might need GI evaluation as well.  Sinus tachycardia -Possibly from dehydration.  Improved.  Leukocytosis -Resolved.  Likely reactive.  CT abdomen/pelvis negative for acute process.  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  Outpatient follow-up with  endocrinology  Anemia of chronic disease -Questionable cause.  Hemoglobin stable.  Outpatient follow-up  Thrombocytosis -Resolved  Hypothyroidism -Continue Synthroid  History of unspecified stroke -Continue aspirin  Obesity -Outpatient follow-up  Discharge Instructions  Discharge Instructions     Diet Carb Modified   Complete by: As directed    Increase activity slowly   Complete by: As directed       Allergies as of 07/14/2021       Reactions   Banana Swelling, Other (See Comments)   Mouth burning and swollen   Bactrim [sulfamethoxazole-trimethoprim] Hives   Doxycycline Hives   Gentian Violet Other (See Comments)   Mouth burns   Gentian Violet-proflavine Sulfate [triple Dye] Other (See Comments)   Mouth burns   Mold Extract [trichophyton] Other (See Comments)   Hay Fever   Morphine And Related Hives   Other Hives, Itching, Other (See Comments)   "Hay fever, dust and pollen."  Per patient.- itching, runny nose   Vancomycin Other (See Comments)   Red man syndrome         Medication List     STOP taking these medications    insulin glargine 100 UNIT/ML injection Commonly known as: LANTUS       TAKE these medications    acetaminophen 500 MG tablet Commonly known as: TYLENOL Take 500 mg by mouth every 6 (six) hours as needed for mild pain or headache.   aspirin EC 81 MG tablet Take 81 mg by mouth daily. Swallow whole.   Azelastine HCl 137 MCG/SPRAY Soln PLACE 2 SPRAYS INTO BOTH NOSTRILS 2 (TWO) TIMES DAILY What changed:  how much to take how to take this when to take this additional instructions   citalopram 10 MG tablet Commonly known as: CELEXA TAKE 0.5 TABLETS BY MOUTH  DAILY. PATIENT WILL CUT PILL IN HALF. SHE HAS A TRAUMATIC BRAIN INJURY AND WILL TAKE HALF THE DOSE. What changed:  how much to take how to take this when to take this additional instructions   DermOtic 0.01 % Oil Generic drug: Fluocinolone Acetonide Place 4 drops  into both ears See admin instructions. Place 4 drops into both ears 2 times a week as needed/as directed   Dexcom G6 Sensor Misc Apply 1 Device topically See admin instructions. Place 1 sensor into the skin every 10 days   Dexcom G6 Transmitter Misc USE 1 EACH EVERY 3 (THREE) MONTHS   EPINEPHrine 0.3 mg/0.3 mL Soaj injection Commonly known as: EPI-PEN Use as directed for severe allergic reaction.   FISH OIL PO Take 1 capsule by mouth daily with supper.   Florastor 250 MG capsule Generic drug: saccharomyces boulardii Take 250 mg by mouth in the morning.   fluconazole 150 MG tablet Commonly known as: DIFLUCAN Take 150 mg by mouth as needed (as directed, for yeast infections).   fluticasone 110 MCG/ACT inhaler Commonly known as: Flovent HFA Inhale 2 puffs into the lungs 2 (two) times daily as needed (for asthma flares, until cough and wheeze-free).   fluticasone 50 MCG/ACT nasal spray Commonly known as: FLONASE 2 sprays per nostril daily as needed for stuffy nose. What changed:  how much to take how to take this when to take this additional instructions   gabapentin 300 MG capsule Commonly known as: NEURONTIN Take 300-600 mg by mouth See admin instructions. Take 300 mg by mouth in the morning and 600 mg at bedtime   Glucagon 3 MG/DOSE Powd Place 3 mg into the nose as needed (low blood sugar).   insulin aspart 100 UNIT/ML injection Commonly known as: novoLOG Inject into the skin See admin instructions. Per pump   levothyroxine 88 MCG tablet Commonly known as: SYNTHROID Take 88 mcg by mouth daily before breakfast.   loratadine 10 MG tablet Commonly known as: CLARITIN Take 1 tablet (10 mg total) by mouth daily as needed for allergies. What changed: when to take this   lubiprostone 8 MCG capsule Commonly known as: AMITIZA Take 16 mcg by mouth daily with breakfast.   magnesium oxide 400 (240 Mg) MG tablet Commonly known as: MAG-OX Take 800 mg by mouth at  bedtime.   metoCLOPramide 5 MG tablet Commonly known as: Reglan Take 1 tablet (5 mg total) by mouth 3 (three) times daily before meals for 10 days.   montelukast 10 MG tablet Commonly known as: SINGULAIR TAKE 1 TABLET (10 MG TOTAL) BY MOUTH AT BEDTIME. TO PREVENT COUGHING OR WHEEZING What changed: additional instructions   Omnipod 5 G6 Pod (Gen 5) Misc Inject 1 Device into the skin See admin instructions. Place 1 pod into the skin every 2-3 days   ondansetron 4 MG tablet Commonly known as: ZOFRAN Take 1 tablet (4 mg total) by mouth every 8 (eight) hours as needed for nausea or vomiting.   pantoprazole 20 MG tablet Commonly known as: Protonix Take 1 tablet (20 mg total) by mouth daily. What changed:  when to take this reasons to take this   potassium chloride 10 MEQ tablet Commonly known as: KLOR-CON Take 10 mEq by mouth daily.   pravastatin 20 MG tablet Commonly known as: PRAVACHOL Take 20 mg by mouth daily after supper.   ProAir HFA 108 (90 Base) MCG/ACT inhaler Generic drug: albuterol Inhale 2 puffs into the lungs every 4 (four) hours as needed for wheezing  or shortness of breath. TAKE 2 PUFFS BY MOUTH EVERY 4 HOURS AS NEEDED Strength: 108 (90 Base) MCG/ACT What changed: additional instructions   SYSTANE OP Place 1 drop into both eyes 3 (three) times daily as needed (for dryness).   vitamin B-12 1000 MCG tablet Commonly known as: CYANOCOBALAMIN Take 1,000 mcg by mouth every 7 (seven) days.        Follow-up Information     Avva, Ravisankar, MD. Schedule an appointment as soon as possible for a visit in 1 week(s).   Specialty: Internal Medicine Contact information: [name] [address] [phone]                Allergies  Allergen Reactions   Banana Swelling and Other (See Comments)    Mouth burning and swollen   Bactrim [Sulfamethoxazole-Trimethoprim] Hives   Doxycycline Hives   Gentian Violet Other (See Comments)    Mouth  burns   Gentian Violet-Proflavine Sulfate [Triple Dye] Other (See Comments)    Mouth burns   Mold Extract [Trichophyton] Other (See Comments)    Hay Fever   Morphine And Related Hives   Other Hives, Itching and Other (See Comments)    "Hay fever, dust and pollen."  Per patient.- itching, runny nose   Vancomycin Other (See Comments)    Red man syndrome     Consultations: None   Procedures/Studies: CT Abdomen Pelvis W Contrast  Result Date: 07/12/2021 CLINICAL DATA:  Abdominal pain, nausea, vomiting, and diarrhea today, history diabetes mellitus EXAM: CT ABDOMEN AND PELVIS WITH CONTRAST TECHNIQUE: Multidetector CT imaging of the abdomen and pelvis was performed using the standard protocol following bolus administration of intravenous contrast. RADIATION DOSE REDUCTION: This exam was performed according to the departmental dose-optimization program which includes automated exposure control, adjustment of the mA and/or kV according to patient size and/or use of iterative reconstruction technique. CONTRAST:  136m OMNIPAQUE IOHEXOL 300 MG/ML SOLN IV. No oral contrast. COMPARISON:  06/03/2020 FINDINGS: Lower chest: Lung bases clear Hepatobiliary: Focal fatty infiltration of liver adjacent to falciform fissure. Gallbladder and liver otherwise normal appearance. Pancreas: Atrophic, no focal abnormalities. Spleen: Normal appearance Adrenals/Urinary Tract: Adrenal glands, kidneys, ureters, and bladder normal appearance Stomach/Bowel: Normal appendix. Stomach and bowel loops normal appearance Vascular/Lymphatic: Vascular structures patent. Aorta normal caliber. No adenopathy. Reproductive: Normal appearing uterus and adnexa Other: No free air or free fluid. No hernia or inflammatory process. Musculoskeletal: Unremarkable IMPRESSION: No acute intra-abdominal or intrapelvic abnormalities. Electronically Signed   By: MLavonia DanaM.D.   On: 07/12/2021 12:36   DG Chest Port 1 View  Result Date:  07/12/2021 CLINICAL DATA:  Tachycardia. EXAM: PORTABLE CHEST 1 VIEW COMPARISON:  09/02/2020 FINDINGS: The cardiac silhouette, mediastinal and hilar contours are within normal limits and stable. The lungs are clear. No pleural effusions. The bony thorax is intact. IMPRESSION: No acute cardiopulmonary findings. Electronically Signed   By: PMarijo SanesM.D.   On: 07/12/2021 11:22      Subjective: Patient seen and examined at bedside.  Mother at bedside as well.  Patient felt anxious earlier this morning with some sore throat but feels better since then.  She is tolerating diet.  Currently denies any diarrhea.  Feels okay to go home today.  Discharge Exam: Vitals:   07/13/21 2006 07/14/21 0637  BP: 115/86 113/79  Pulse: 98 92  Resp: 18 18  Temp: 97.9 F (36.6 C) 98.2 F (36.8 C)  SpO2: 99% 96%    General: Pt is alert, awake,  not in acute distress.  Currently on room air.  Slow to respond.  Poor historian. Cardiovascular: rate controlled, S1/S2 + Respiratory: bilateral decreased breath sounds at bases Abdominal: Soft, NT, ND, bowel sounds + Extremities: no edema, no cyanosis    The results of significant diagnostics from this hospitalization (including imaging, microbiology, ancillary and laboratory) are listed below for reference.     Microbiology: Recent Results (from the past 240 hour(s))  C Difficile Quick Screen w PCR reflex     Status: None   Collection Time: 07/13/21  8:59 AM   Specimen: STOOL  Result Value Ref Range Status   C Diff antigen NEGATIVE NEGATIVE Final   C Diff toxin NEGATIVE NEGATIVE Final   C Diff interpretation No C. difficile detected.  Final    Comment: Performed at [facility], [place] [address] [place], [place] [address]  Gastrointestinal Panel by PCR , Stool     Status: None   Collection Time: 07/13/21  8:59 AM   Specimen: STOOL  Result Value Ref Range Status   Campylobacter species NOT DETECTED NOT DETECTED Final   Plesimonas  shigelloides NOT DETECTED NOT DETECTED Final   Salmonella species NOT DETECTED NOT DETECTED Final   Yersinia enterocolitica NOT DETECTED NOT DETECTED Final   Vibrio species NOT DETECTED NOT DETECTED Final   Vibrio cholerae NOT DETECTED NOT DETECTED Final   Enteroaggregative E coli (EAEC) NOT DETECTED NOT DETECTED Final   Enteropathogenic E coli (EPEC) NOT DETECTED NOT DETECTED Final   Enterotoxigenic E coli (ETEC) NOT DETECTED NOT DETECTED Final   Shiga like toxin producing E coli (STEC) NOT DETECTED NOT DETECTED Final   Shigella/Enteroinvasive E coli (EIEC) NOT DETECTED NOT DETECTED Final   Cryptosporidium NOT DETECTED NOT DETECTED Final   Cyclospora cayetanensis NOT DETECTED NOT DETECTED Final   Entamoeba histolytica NOT DETECTED NOT DETECTED Final   Giardia lamblia NOT DETECTED NOT DETECTED Final   Adenovirus F40/41 NOT DETECTED NOT DETECTED Final   Astrovirus NOT DETECTED NOT DETECTED Final   Norovirus GI/GII NOT DETECTED NOT DETECTED Final   Rotavirus A NOT DETECTED NOT DETECTED Final   Sapovirus (I, II, IV, and V) NOT DETECTED NOT DETECTED Final    Comment: Performed at [facility], [address] [place], [place] [address]     Labs: BNP (last 3 results) No results for input(s): "BNP" in the last 8760 hours. Basic Metabolic Panel: Recent Labs  Lab 07/12/21 0757 07/14/21 0530  NA 136 141  K 3.9 3.6  CL 100 104  CO2 27 28  GLUCOSE 184* 313*  BUN 19 10  CREATININE 1.08* 0.94  CALCIUM 9.1 9.2  MG 1.8  --    Liver Function Tests: Recent Labs  Lab 07/14/21 0530  AST 14*  ALT 19  ALKPHOS 113  BILITOT 0.7  PROT 6.5  ALBUMIN 3.4*   No results for input(s): "LIPASE", "AMYLASE" in the last 168 hours. No results for input(s): "AMMONIA" in the last 168 hours. CBC: Recent Labs  Lab 07/12/21 0757 07/13/21 0508 07/14/21 0530  WBC 14.5* 8.6 8.8  NEUTROABS 10.8*  --   --   HGB 14.0 10.8* 11.2*  HCT 42.1 33.6* 34.6*  MCV 88.8 90.6 89.9  PLT 457* 380 365    Cardiac Enzymes: No results for input(s): "CKTOTAL", "CKMB", "CKMBINDEX", "TROPONINI" in the last 168 hours. BNP: Invalid input(s): "POCBNP" CBG: Recent Labs  Lab 07/13/21 1142 07/13/21 1658 07/13/21 2004 07/14/21 0000 07/14/21 0719  GLUCAP 276* 272* 256*  253* 257*   D-Dimer Recent Labs    07/12/21 0757  DDIMER 0.43   Hgb A1c Recent Labs    07/13/21 0508  HGBA1C 8.8*   Lipid Profile No results for input(s): "CHOL", "HDL", "LDLCALC", "TRIG", "CHOLHDL", "LDLDIRECT" in the last 72 hours. Thyroid function studies Recent Labs    07/13/21 0508  TSH 0.669   Anemia work up No results for input(s): "VITAMINB12", "FOLATE", "FERRITIN", "TIBC", "IRON", "RETICCTPCT" in the last 72 hours. Urinalysis    Component Value Date/Time   COLORURINE YELLOW 07/12/2021 1115   APPEARANCEUR CLEAR 07/12/2021 1115   LABSPEC 1.015 07/12/2021 1115   PHURINE 6.0 07/12/2021 1115   GLUCOSEU 250 (A) 07/12/2021 1115   HGBUR MODERATE (A) 07/12/2021 1115   BILIRUBINUR NEGATIVE 07/12/2021 1115   KETONESUR 40 (A) 07/12/2021 1115   PROTEINUR NEGATIVE 07/12/2021 1115   UROBILINOGEN 0.2 07/14/2013 0746   NITRITE NEGATIVE 07/12/2021 1115   LEUKOCYTESUR NEGATIVE 07/12/2021 1115   Sepsis Labs Recent Labs  Lab 07/12/21 0757 07/13/21 0508 07/14/21 0530  WBC 14.5* 8.6 8.8   Microbiology Recent Results (from the past 240 hour(s))  C Difficile Quick Screen w PCR reflex     Status: None   Collection Time: 07/13/21  8:59 AM   Specimen: STOOL  Result Value Ref Range Status   C Diff antigen NEGATIVE NEGATIVE Final   C Diff toxin NEGATIVE NEGATIVE Final   C Diff interpretation No C. difficile detected.  Final    Comment: Performed at [facility], [place] [address], [place], [place] [address]  Gastrointestinal Panel by PCR , Stool     Status: None   Collection Time: 07/13/21  8:59 AM   Specimen: STOOL  Result Value Ref Range Status   Campylobacter species NOT DETECTED NOT  DETECTED Final   Plesimonas shigelloides NOT DETECTED NOT DETECTED Final   Salmonella species NOT DETECTED NOT DETECTED Final   Yersinia enterocolitica NOT DETECTED NOT DETECTED Final   Vibrio species NOT DETECTED NOT DETECTED Final   Vibrio cholerae NOT DETECTED NOT DETECTED Final   Enteroaggregative E coli (EAEC) NOT DETECTED NOT DETECTED Final   Enteropathogenic E coli (EPEC) NOT DETECTED NOT DETECTED Final   Enterotoxigenic E coli (ETEC) NOT DETECTED NOT DETECTED Final   Shiga like toxin producing E coli (STEC) NOT DETECTED NOT DETECTED Final   Shigella/Enteroinvasive E coli (EIEC) NOT DETECTED NOT DETECTED Final   Cryptosporidium NOT DETECTED NOT DETECTED Final   Cyclospora cayetanensis NOT DETECTED NOT DETECTED Final   Entamoeba histolytica NOT DETECTED NOT DETECTED Final   Giardia lamblia NOT DETECTED NOT DETECTED Final   Adenovirus F40/41 NOT DETECTED NOT DETECTED Final   Astrovirus NOT DETECTED NOT DETECTED Final   Norovirus GI/GII NOT DETECTED NOT DETECTED Final   Rotavirus A NOT DETECTED NOT DETECTED Final   Sapovirus (I, II, IV, and V) NOT DETECTED NOT DETECTED Final    Comment: Performed at [facility], [address] [place], [place] [address]     Time coordinating discharge: 35 minutes  SIGNED:   Aline August, MD  [facility] 07/14/2021, 10:54 AM

## 2021-07-14 NOTE — Progress Notes (Signed)
Inpatient Diabetes Program Recommendations  AACE/ADA: New Consensus Statement on Inpatient Glycemic Control (2015)  Target Ranges:  Prepandial:   less than 140 mg/dL      Peak postprandial:   less than 180 mg/dL (1-2 hours)      Critically ill patients:  140 - 180 mg/dL   Lab Results  Component Value Date   GLUCAP 257 (H) 07/14/2021   HGBA1C 8.8 (H) 07/13/2021    Review of Glycemic Control  Diabetes history: DM1 Outpatient Diabetes medications: Insulin pump - OmniPod Current orders for Inpatient glycemic control: Insulin pump  HgbA1C - 8.8% Glucose 313 mg/dL this am 257 before breakfast.   Inpatient Diabetes Program Recommendations:    Continue home insulin pump.  Spoke with pt and Father on 6/27 afternoon regarding pt's glucose control, pump use. Father states pt goes to [facility] approx every 3 months.  Basal - 29.4 units/day Bolus - CF 26-30 mg/dL with goal of 120 CHO ratio 1:6-7 units  CBGs running high.  6/27: 248-272 mg/dL  May need to change insertion site. Pt eating approx 50-75%.  Continue to follow.  Thank you. Lorenda Peck, RD, LDN, CDE Inpatient Diabetes Coordinator [phone]

## 2021-08-24 ENCOUNTER — Other Ambulatory Visit: Payer: Self-pay | Admitting: Family Medicine

## 2021-08-24 ENCOUNTER — Other Ambulatory Visit: Payer: Self-pay

## 2021-09-02 ENCOUNTER — Encounter (HOSPITAL_COMMUNITY): Payer: Self-pay | Admitting: Psychiatry

## 2021-09-02 ENCOUNTER — Telehealth (INDEPENDENT_AMBULATORY_CARE_PROVIDER_SITE_OTHER): Payer: Medicaid Other | Admitting: Psychiatry

## 2021-09-02 DIAGNOSIS — F419 Anxiety disorder, unspecified: Secondary | ICD-10-CM

## 2021-09-02 DIAGNOSIS — F330 Major depressive disorder, recurrent, mild: Secondary | ICD-10-CM | POA: Diagnosis not present

## 2021-09-02 MED ORDER — CITALOPRAM HYDROBROMIDE 10 MG PO TABS: 5.0000 mg | ORAL_TABLET | Freq: Every day | ORAL | 3 refills | Status: DC

## 2021-09-02 NOTE — Progress Notes (Signed)
[place] MD/PA/NP OP Progress Note Virtual Visit via Video Note  I connected with [name] on 09/02/21 at  1:00 PM EDT by a video enabled telemedicine application and verified that I am speaking with the correct person using two identifiers.  Location: Patient: Home Provider: Clinic   I discussed the limitations of evaluation and management by telemedicine and the availability of in person appointments. The patient expressed understanding and agreed to proceed.  I provided 30 minutes of non-face-to-face time during this encounter.        09/02/2021 1:09 PM Tracey Perkins  MRN:  [id number]  Chief Complaint:  "I'm good head space"   HPI: 36 year old female seen today for follow up psychiatric evaluation.   She has a psychiatric history of depression and anxiey.  She is currently being managed on Celexa 5 mg daily. She notes that her medication is effective in managing her psychiatric conditions.  Today she was unable to login virtually so her assessment was done over the phone. During exam she was  pleasant, cooperative, and engaged in conversation.   She informed Probation officer that  she is in a good headspace.  She reports that she continues to study at [employer] where she plans to obtain a degree in [place] studies.  Ports that this summer she went to [employer] and also visited [place].  She notes that her mood is stable and reports that she has minimal anxiety and depression.   Today provider conducted a GAD-7 and patient scored a 6. Provider also conducted PHQ-9 and patient scored a 2.  She endorses adequate sleep and appetite.   Today she denies SI/HI/VH, mania, or paranoia.  Patient informed Probation officer that this time of year saddens her.  She reports that her childhood friend passed of cancer a few years ago.  Writer asked patient if she would be interested in therapy.  She notes that she will consider it and give me a call if she chooses to pursue it.  No medication changes made today. Patient  will follow-up with neurology regarding Ritalin.  No other concerns at this time.  Visit Diagnosis:    ICD-10-CM   1. Anxiety  F41.9 citalopram (CELEXA) 10 MG tablet    2. Mild episode of recurrent major depressive disorder (HCC)  F33.0 citalopram (CELEXA) 10 MG tablet      Past Psychiatric History: Anxiety and depression   Past Medical History:  Past Medical History:  Diagnosis Date   Asthma    Brain tumor ([place])    Diabetes mellitus without complication ([place])    Eczema    Food allergy    Gastroparesis    Hypokalemia    Hypomagnesemia    Hypothyroid    Neuropathy    Post concussion syndrome 05/2019   Thyroid disease    Vision changes     Past Surgical History:  Procedure Laterality Date   BRAIN SURGERY     Portacath placement and removed     WISDOM TOOTH EXTRACTION      Family Psychiatric History: Maternal uncle schizophrenia and overdosed on pain pills. Paternal cousin and aunt bipolar  Family History:  Family History  Problem Relation Age of Onset   Thyroid disease Mother    Eczema Sister    Thyroid disease Sister    Thyroid disease Sister     Social History:  Social History   Socioeconomic History   Marital status: Single    Spouse name: Not on file   Number of children:  Not on file   Years of education: Not on file   Highest education level: Not on file  Occupational History   Not on file  Tobacco Use   Smoking status: Never   Smokeless tobacco: Never  Vaping Use   Vaping Use: Never used  Substance and Sexual Activity   Alcohol use: No   Drug use: No   Sexual activity: Yes    Birth control/protection: Pill  Other Topics Concern   Not on file  Social History Narrative   Not on file   Social Determinants of Health   Financial Resource Strain: Not on file  Food Insecurity: Not on file  Transportation Needs: Not on file  Physical Activity: Not on file  Stress: Not on file  Social Connections: Not on file    Allergies:  Allergies   Allergen Reactions   Banana Swelling and Other (See Comments)    Mouth burning and swollen   Bactrim [Sulfamethoxazole-Trimethoprim] Hives   Doxycycline Hives   Gentian Violet Other (See Comments)    Mouth burns   Gentian Violet-Proflavine Sulfate [Triple Dye] Other (See Comments)    Mouth burns   Mold Extract [Trichophyton] Other (See Comments)    Hay Fever   Morphine And Related Hives   Other Hives, Itching and Other (See Comments)    "Hay fever, dust and pollen."  Per patient.- itching, runny nose   Vancomycin Other (See Comments)    Red man syndrome     Metabolic Disorder Labs: Lab Results  Component Value Date   HGBA1C 8.8 (H) 07/13/2021   MPG 206 07/13/2021   MPG 197.25 09/03/2020   No results found for: "PROLACTIN" Lab Results  Component Value Date   CHOL 215 (H) 09/03/2020   TRIG 198 (H) 09/03/2020   HDL 40 (L) 09/03/2020   CHOLHDL 5.4 09/03/2020   VLDL 40 09/03/2020   LDLCALC 135 (H) 09/03/2020   Lab Results  Component Value Date   TSH 0.669 07/13/2021    Therapeutic Level Labs: No results found for: "LITHIUM" No results found for: "VALPROATE" No results found for: "CBMZ"  Current Medications: Current Outpatient Medications  Medication Sig Dispense Refill   acetaminophen (TYLENOL) 500 MG tablet Take 500 mg by mouth every 6 (six) hours as needed for mild pain or headache.     aspirin EC 81 MG tablet Take 81 mg by mouth daily. Swallow whole.     Azelastine HCl 137 MCG/SPRAY SOLN PLACE 2 SPRAYS INTO BOTH NOSTRILS 2 (TWO) TIMES DAILY (Patient taking differently: Place 2 sprays into both nostrils 2 (two) times daily.) 30 mL 5   citalopram (CELEXA) 10 MG tablet Take 0.5 tablets (5 mg total) by mouth at bedtime. 30 tablet 3   Continuous Blood Gluc Sensor (DEXCOM G6 SENSOR) MISC Apply 1 Device topically See admin instructions. Place 1 sensor into the skin every 10 days     Continuous Blood Gluc Transmit (DEXCOM G6 TRANSMITTER) MISC USE 1 EACH EVERY 3 (THREE)  MONTHS     DERMOTIC 0.01 % OIL Place 4 drops into both ears See admin instructions. Place 4 drops into both ears 2 times a week as needed/as directed     EPINEPHrine 0.3 mg/0.3 mL IJ SOAJ injection Use as directed for severe allergic reaction. 2 each 1   FLORASTOR 250 MG capsule Take 250 mg by mouth in the morning.     fluconazole (DIFLUCAN) 150 MG tablet Take 150 mg by mouth as needed (as directed, for yeast infections).  fluticasone (FLONASE) 50 MCG/ACT nasal spray 2 sprays per nostril daily as needed for stuffy nose. (Patient taking differently: Place 2 sprays into both nostrils daily.) 16 g 5   fluticasone (FLOVENT HFA) 110 MCG/ACT inhaler Inhale 2 puffs into the lungs 2 (two) times daily as needed (for asthma flares, until cough and wheeze-free).     gabapentin (NEURONTIN) 300 MG capsule Take 300-600 mg by mouth See admin instructions. Take 300 mg by mouth in the morning and 600 mg at bedtime     Glucagon 3 MG/DOSE POWD Place 3 mg into the nose as needed (low blood sugar).     insulin aspart (NOVOLOG) 100 UNIT/ML injection Inject into the skin See admin instructions. Per pump     Insulin Disposable Pump (OMNIPOD 5 G6 POD, GEN 5,) MISC Inject 1 Device into the skin See admin instructions. Place 1 pod into the skin every 2-3 days     levothyroxine (SYNTHROID) 88 MCG tablet Take 88 mcg by mouth daily before breakfast.     loratadine (CLARITIN) 10 MG tablet Take 1 tablet (10 mg total) by mouth daily as needed for allergies. (Patient taking differently: Take 10 mg by mouth in the morning.) 30 tablet 5   lubiprostone (AMITIZA) 8 MCG capsule Take 16 mcg by mouth daily with breakfast.      magnesium oxide (MAG-OX) 400 (240 Mg) MG tablet Take 800 mg by mouth at bedtime.     metoCLOPramide (REGLAN) 5 MG tablet Take 1 tablet (5 mg total) by mouth 3 (three) times daily before meals for 10 days. 30 tablet 0   montelukast (SINGULAIR) 10 MG tablet TAKE 1 TABLET (10 MG TOTAL) BY MOUTH AT BEDTIME. TO PREVENT  COUGHING OR WHEEZING 90 tablet 1   Omega-3 Fatty Acids (FISH OIL PO) Take 1 capsule by mouth daily with supper.     ondansetron (ZOFRAN) 4 MG tablet Take 1 tablet (4 mg total) by mouth every 8 (eight) hours as needed for nausea or vomiting. 20 tablet 0   pantoprazole (PROTONIX) 20 MG tablet Take 1 tablet (20 mg total) by mouth daily. (Patient taking differently: Take 20 mg by mouth daily as needed for heartburn or indigestion.) 30 tablet 2   Polyethyl Glycol-Propyl Glycol (SYSTANE OP) Place 1 drop into both eyes 3 (three) times daily as needed (for dryness).     potassium chloride (KLOR-CON) 10 MEQ tablet Take 10 mEq by mouth daily.     pravastatin (PRAVACHOL) 20 MG tablet Take 20 mg by mouth daily after supper.     PROAIR HFA 108 (90 Base) MCG/ACT inhaler Inhale 2 puffs into the lungs every 4 (four) hours as needed for wheezing or shortness of breath. TAKE 2 PUFFS BY MOUTH EVERY 4 HOURS AS NEEDED Strength: 108 (90 Base) MCG/ACT (Patient taking differently: Inhale 2 puffs into the lungs every 4 (four) hours as needed for wheezing or shortness of breath.) 6.7 g 0   vitamin B-12 (CYANOCOBALAMIN) 1000 MCG tablet Take 1,000 mcg by mouth every 7 (seven) days.     No current facility-administered medications for this visit.     Musculoskeletal: Strength & Muscle Tone:  Unable to assess due to telephone visit Gait & Station:  Unable to assess due to telephone visit Patient leans: N/A  Psychiatric Specialty Exam: Review of Systems  There were no vitals taken for this visit.There is no height or weight on file to calculate BMI.  General Appearance:  Unable to assess due to telephone visit  Eye Contact:  Unable to assess due to telephone visit  Speech:  Clear and Coherent and Slow  Volume:  Normal  Mood:  Euthymic  Affect:  Appropriate and Congruent  Thought Process:  Coherent, Goal Directed and Linear  Orientation:  Full (Time, Place, and Person)  Thought Content: WDL and Logical   Suicidal  Thoughts:  No  Homicidal Thoughts:  No  Memory:  Immediate;   Good Recent;   Good Remote;   Good  Judgement:  Good  Insight:  Good  Psychomotor Activity:   Unable to assess due to telephone visit  Concentration:  Concentration: Good and Attention Span: Good  Recall:  Good  Fund of Knowledge: Good  Language: Good  Akathisia:   Unable to assess due to telephone visit  Handed:  Right  AIMS (if indicated):Not done  Assets:  Communication Skills Desire for Improvement Financial Resources/Insurance Housing Social Support  ADL's:  Intact  Cognition: WNL  Sleep:  Good   Screenings: GAD-7    Flowsheet Row Video Visit from 09/02/2021 in [facility] Video Visit from 03/11/2021 in [facility] Video Visit from 07/08/2020 in [facility] Video Visit from 04/20/2020 in [facility]  Total GAD-7 Score '6 7 8 7      '$ PHQ2-9    Flowsheet Row Video Visit from 09/02/2021 in [facility] Video Visit from 03/11/2021 in [facility] Video Visit from 07/08/2020 in [facility] Video Visit from 04/20/2020 in [facility] Counselor from 09/17/2019 in [facility]  PHQ-2 Total Score 0 0 1 0 2  PHQ-9 Total Score '2 3 9 6 7      '$ Flowsheet Row ED to Hosp-Admission (Discharged) from 07/12/2021 in [place] ED from 05/25/2021 in [place] [facility] from 01/16/2021 in [place] No Risk No Risk No Risk        Assessment and Plan: Patient notes that she is doing well on her current medication regimen.  No other medication changes made today.  Patient agreeable to continue medications as prescribed. 1. Anxiety  Continue- citalopram  (CELEXA) 10 MG tablet; Take 0.5 tablets (5 mg total) by mouth daily. Patient will cut pill in half. She has a traumatic brain injury and will take half the dose.  Dispense: 30 tablet; Refill: 3  2. Mild episode of recurrent major depressive disorder (HCC)  Continue- citalopram (CELEXA) 10 MG tablet; Take 0.5 tablets (5 mg total) by mouth daily. Patient will cut pill in half. She has a traumatic brain injury and will take half the dose.  Dispense: 30 tablet; Refill: 3     Follow-up in 3 months Follow-up with therapy  Salley Slaughter, NP 09/02/2021, 1:09 PM

## 2021-11-05 ENCOUNTER — Ambulatory Visit: Payer: Medicaid Other | Attending: Family Medicine | Admitting: Physical Therapy

## 2021-11-05 ENCOUNTER — Encounter: Payer: Self-pay | Admitting: Physical Therapy

## 2021-11-05 DIAGNOSIS — R296 Repeated falls: Secondary | ICD-10-CM | POA: Diagnosis present

## 2021-11-05 DIAGNOSIS — M25562 Pain in left knee: Secondary | ICD-10-CM | POA: Diagnosis present

## 2021-11-05 DIAGNOSIS — R262 Difficulty in walking, not elsewhere classified: Secondary | ICD-10-CM | POA: Insufficient documentation

## 2021-11-05 DIAGNOSIS — M6281 Muscle weakness (generalized): Secondary | ICD-10-CM | POA: Diagnosis not present

## 2021-11-05 DIAGNOSIS — R278 Other lack of coordination: Secondary | ICD-10-CM | POA: Diagnosis present

## 2021-11-05 NOTE — Therapy (Signed)
OUTPATIENT PHYSICAL THERAPY LOWER EXTREMITY EVALUATION   Patient Name: Tracey Perkins MRN: [id number] DOB:1985/12/16, 36 y.o., female Today's Date: 11/05/2021   PT End of Session - 11/05/21 1210     Visit Number 1    Number of Visits 24    Date for PT Re-Evaluation 01/28/22    PT Start Time 0930    PT Stop Time 1010    PT Time Calculation (min) 40 min    Activity Tolerance Patient tolerated treatment well    Behavior During Therapy [facility] for tasks assessed/performed             Past Medical History:  Diagnosis Date   Asthma    Brain tumor ([place])    Diabetes mellitus without complication ([place])    Eczema    Food allergy    Gastroparesis    Hypokalemia    Hypomagnesemia    Hypothyroid    Neuropathy    Post concussion syndrome 05/2019   Thyroid disease    Vision changes    Past Surgical History:  Procedure Laterality Date   BRAIN SURGERY     Portacath placement and removed     WISDOM TOOTH EXTRACTION     Patient Active Problem List   Diagnosis Date Noted   Tachycardia 07/12/2021   Intractable nausea and vomiting 07/12/2021   Diarrhea 07/12/2021   Diabetic neuropathy ([place]) 07/12/2021   Hypothyroidism 07/12/2021   Mild cognitive impairment 09/04/2020   Diabetic gastroparesis ([place]) 09/04/2020   Acute CVA (cerebrovascular accident) ([place]) 09/03/2020   Impulsiveness 10/14/2019   Mild episode of recurrent major depressive disorder ([place]) 09/04/2019   Anxiety 08/16/2019   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   Status post radiation therapy 11/22/2018   Seasonal and perennial allergic rhinitis 11/22/2018   Seasonal allergic conjunctivitis 11/22/2018   Acute sinusitis 11/22/2018   Keratosis pilaris 10/05/2017   Melanocytic nevi of trunk 10/05/2017   Adverse food reaction 02/09/2017   Gastroesophageal reflux disease 02/09/2017   Mild intermittent asthma with acute exacerbation 11/19/2015   Mild persistent asthma without  complication [id number]/45/3646   Acute seasonal allergic rhinitis due to pollen 11/16/2015   Anaphylactic shock due to adverse food reaction 11/16/2015   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   Type 1 diabetes ([place]) 11/16/2015   Dry mouth 11/16/2015   Xerosis cutis 11/16/2015   Nystagmus 08/15/2014   Visual field defect 08/15/2014   Disequilibrium 07/25/2014   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 The next day she developed pain in her posterior knee. The Dr felt that her knee cap was out of alignment. The pain impedes her ability to get dressed or lift her leg into the car. She has any difficulty maneuvering the the limb. She has a knee sleeve, but has not been wearing it. It helped initially.  PERTINENT HISTORY: CVA 10/22, childhood brain tumor with chemo, concussion 2 years ago, DM1 PAIN:  Are you having pain? Yes: NPRS scale: 4/10 Pain location: L knee/post  knee Pain description: sharp, then takes a while to resolve Aggravating factors: maneuvering her leg Relieving factors: Heat, medication  PRECAUTIONS: None  WEIGHT BEARING RESTRICTIONS: No  FALLS:  Has patient fallen in last 6 months? 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  PLOF: Independent  PATIENT GOALS: She would like to be able to get dressed and perform her normal daily activities without pain and more efficiently.   OBJECTIVE:   DIAGNOSTIC FINDINGS: x rays showed subluxed patella  COGNITION: Overall cognitive status: Within functional limits for tasks assessed     SENSATION: Not tested  EDEMA:  Patient denies swelling  MUSCLE LENGTH: Hamstrings: TBD Thomas test: TBD  POSTURE: rounded shoulders and increased lumbar lordosis  PALPATION: TTP on post/med HS, patellar tendon, ITB  LOWER EXTREMITY ROM:   Generally mildly stiff, but WFL   LOWER EXTREMITY MMT:   MMT Right eval Left eval  Hip flexion 4- 3+  Hip extension 3 3  Hip abduction 3 3  Hip adduction    Hip internal rotation    Hip external rotation    Knee flexion 4 3+  Knee extension 4- 4-  Ankle dorsiflexion 4 4  Ankle plantarflexion    Ankle inversion    Ankle eversion     (Blank rows = not tested)  LOWER EXTREMITY SPECIAL TESTS:  Knee special tests: Anterior drawer test: negative, Posterior drawer test: negative, Patellafemoral apprehension test: negative, and Patellafemoral grind test: positive   FUNCTIONAL TESTS:  5 times sit to stand: 12.46 Functional gait assessment: 13/30  GAIT: Distance walked: In clinic distances Assistive device utilized: None Level of assistance: Complete Independence Comments: Slow, unsteady, step to with antalgic pattern favoring LLE. She reports her RLE used to be her strongest, but since her most recent stroke, is no longer as strong.   TODAY'S TREATMENT                                                                                                                            DATE: education  PATIENT EDUCATION:  Education details: POC Person educated: Patient Education method: Customer service manager Education comprehension:  verbalized understanding  HOME EXERCISE PROGRAM: TBD  ASSESSMENT:  CLINICAL IMPRESSION: Patient is a 36 y.o. who was seen today for physical therapy evaluation and treatment for L distal, med HS strain and patellar tendonitis, poorly tracking L patella..   OBJECTIVE IMPAIRMENTS: Abnormal gait, decreased activity tolerance, decreased balance, decreased coordination, decreased endurance, decreased mobility, difficulty walking, decreased ROM, decreased strength, impaired flexibility, improper body mechanics, and pain.   ACTIVITY LIMITATIONS: bending, standing, squatting, stairs, and locomotion level  PARTICIPATION LIMITATIONS: school  PERSONAL FACTORS: 3+ comorbidities: Cancer survivor, CVA, concussion,  DM  are also affecting patient's functional outcome.   REHAB POTENTIAL: Good  CLINICAL DECISION MAKING: Stable/uncomplicated  EVALUATION COMPLEXITY: Moderate   GOALS: Goals reviewed with patient? Yes  SHORT TERM GOALS: Target date: 12/03/2021  I with basic HEP Baseline: Goal status: INITIAL  LONG TERM GOALS: Target date: 01/28/2022   I with final HEP Baseline:  Goal status: INITIAL  2.  Patient will score at least 20/30 on FGA to demonstrate significant improvement in balance and gait. Baseline: 13/30 Goal status: INITIAL  3.  Patient will return to her previous exercise plan with pain in L knee <3/10 Baseline:  Goal status: INITIAL  4.  Patient will be able to walk at least 500' with normalized gait pattern, no antalgic symptoms on L. Baseline:  Goal status: INITIAL  5.  Increase BLE strength to at least 4/5 Baseline:  Goal status: INITIAL  PLAN:  PT FREQUENCY: 2x/week  PT DURATION: 10 weeks  PLANNED INTERVENTIONS: Therapeutic exercises, Therapeutic activity, Neuromuscular re-education, Balance training, Gait training, Patient/Family education, Self Care, Joint mobilization, Stair training, Dry Needling, and Manual therapy, [place]  PLAN FOR NEXT SESSION:  Initiate HEP **No traction, estim, Ellison Carwin, MH/CP**   Marcelina Morel, DPT 11/05/2021, 12:15 PM

## 2021-11-18 ENCOUNTER — Ambulatory Visit: Payer: Medicaid Other

## 2021-11-23 ENCOUNTER — Ambulatory Visit: Payer: Medicaid Other | Attending: Family Medicine

## 2021-11-23 DIAGNOSIS — R278 Other lack of coordination: Secondary | ICD-10-CM | POA: Diagnosis present

## 2021-11-23 DIAGNOSIS — R262 Difficulty in walking, not elsewhere classified: Secondary | ICD-10-CM | POA: Diagnosis present

## 2021-11-23 DIAGNOSIS — M6281 Muscle weakness (generalized): Secondary | ICD-10-CM

## 2021-11-23 DIAGNOSIS — R2689 Other abnormalities of gait and mobility: Secondary | ICD-10-CM

## 2021-11-23 DIAGNOSIS — M25562 Pain in left knee: Secondary | ICD-10-CM

## 2021-11-23 DIAGNOSIS — R2681 Unsteadiness on feet: Secondary | ICD-10-CM | POA: Diagnosis present

## 2021-11-23 DIAGNOSIS — R296 Repeated falls: Secondary | ICD-10-CM

## 2021-11-23 NOTE — Therapy (Signed)
OUTPATIENT PHYSICAL THERAPY LOWER EXTREMITY TREATMENT   Patient Name: Tracey Perkins MRN: [id number] DOB:Dec 02, 1985, 36 y.o., female Today's Date: 11/23/2021   PT End of Session - 11/23/21 1143     Visit Number 2    Number of Visits 24    Date for PT Re-Evaluation 01/28/22    PT Start Time 1143    PT Stop Time 1230    PT Time Calculation (min) 47 min    Activity Tolerance Patient tolerated treatment well    Behavior During Therapy WFL for tasks assessed/performed              Past Medical History:  Diagnosis Date   Asthma    Brain tumor ([place])    Diabetes mellitus without complication ([place])    Eczema    Food allergy    Gastroparesis    Hypokalemia    Hypomagnesemia    Hypothyroid    Neuropathy    Post concussion syndrome 05/2019   Thyroid disease    Vision changes    Past Surgical History:  Procedure Laterality Date   BRAIN SURGERY     Portacath placement and removed     WISDOM TOOTH EXTRACTION     Patient Active Problem List   Diagnosis Date Noted   Tachycardia 07/12/2021   Intractable nausea and vomiting 07/12/2021   Diarrhea 07/12/2021   Diabetic neuropathy ([place]) 07/12/2021   Hypothyroidism 07/12/2021   Mild cognitive impairment 09/04/2020   Diabetic gastroparesis ([place]) 09/04/2020   Acute CVA (cerebrovascular accident) ([place]) 09/03/2020   Impulsiveness 10/14/2019   Mild episode of recurrent major depressive disorder ([place]) 09/04/2019   Anxiety 08/16/2019   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   Status post radiation therapy 11/22/2018   Seasonal and perennial allergic rhinitis 11/22/2018   Seasonal allergic conjunctivitis 11/22/2018   Acute sinusitis 11/22/2018   Keratosis pilaris 10/05/2017   Melanocytic nevi of trunk 10/05/2017   Adverse food reaction 02/09/2017   Gastroesophageal reflux disease 02/09/2017   Mild intermittent asthma with acute exacerbation 11/19/2015   Mild persistent asthma without  complication 32/35/5732   Acute seasonal allergic rhinitis due to pollen 11/16/2015   Anaphylactic shock due to adverse food reaction 11/16/2015   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   Type 1 diabetes ([place]) 11/16/2015   Dry mouth 11/16/2015   Xerosis cutis 11/16/2015   Nystagmus 08/15/2014   Visual field defect 08/15/2014   Disequilibrium 07/25/2014   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    PCP: Prince Solian  REFERRING PROVIDER: Gentry Fitz, MD   REFERRING DIAG: L distal medial ham strain and patella tendon strain   THERAPY DIAG:  Muscle weakness (generalized)  Acute pain of left knee  Other abnormalities of gait and mobility  Unsteadiness on feet  Difficulty in walking, not elsewhere classified  Other lack of coordination  Repeated falls  Rationale for Evaluation and Treatment Rehabilitation  ONSET DATE: 10/19/2021   SUBJECTIVE:   SUBJECTIVE STATEMENT: I have fallen twice since the eval, so my knee is more sore than usual. Once in the yard and then one time in the house. Not having pain unless I use the muscle.   PERTINENT HISTORY: CVA 10/22, childhood brain tumor with chemo, concussion 2 years ago, DM1 PAIN:  Are you having pain? Yes: NPRS scale: 4/10 Pain location: L knee/post knee Pain description: sharp, then takes a while to resolve Aggravating factors: maneuvering her leg Relieving factors: Heat, medication  PRECAUTIONS: None  WEIGHT BEARING  RESTRICTIONS: No  FALLS:  Has patient fallen in last 6 months? 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  PLOF: Independent  PATIENT GOALS: She would like to be able to  get dressed and perform her normal daily activities without pain and more efficiently.   OBJECTIVE:   DIAGNOSTIC FINDINGS: x rays showed subluxed patella  COGNITION: Overall cognitive status: Within functional limits for tasks assessed     SENSATION: Not tested  EDEMA:  Patient denies swelling  MUSCLE LENGTH: Hamstrings: TBD Thomas test: TBD  POSTURE: rounded shoulders and increased lumbar lordosis  PALPATION: TTP on post/med HS, patellar tendon, ITB  LOWER EXTREMITY ROM:   Generally mildly stiff, but WFL   LOWER EXTREMITY MMT:   MMT Right eval Left eval  Hip flexion 4- 3+  Hip extension 3 3  Hip abduction 3 3  Hip adduction    Hip internal rotation    Hip external rotation    Knee flexion 4 3+  Knee extension 4- 4-  Ankle dorsiflexion 4 4  Ankle plantarflexion    Ankle inversion    Ankle eversion     (Blank rows = not tested)  LOWER EXTREMITY SPECIAL TESTS:  Knee special tests: Anterior drawer test: negative, Posterior drawer test: negative, Patellafemoral apprehension test: negative, and Patellafemoral grind test: positive   FUNCTIONAL TESTS:  5 times sit to stand: 12.46 Functional gait assessment: 13/30  GAIT: Distance walked: In clinic distances Assistive device utilized: None Level of assistance: Complete Independence Comments: Slow, unsteady, step to with antalgic pattern favoring LLE. She reports her RLE used to be her strongest, but since her most recent stroke, is no longer as strong.   TODAY'S TREATMENT                                                                                                                            DATE: 11/23/21 Bike L2 x78mns  STS 2x10 LAQ 3# 2x10 HS curls greenTB 2x10  Side steps on airex in // bars Walking on beam side steps   PATIENT EDUCATION:  Education details: POC Person educated: Patient Education method: ECustomer service managerEducation comprehension: verbalized understanding  HOME  EXERCISE PROGRAM: Access Code: [email or web address]   ASSESSMENT:  CLINICAL IMPRESSION: Patient returns with reports of 2 falls since evaluation. She states her knee only hurts if she moves it a certain way. She also reports trouble putting on her socks, was educated about a SCustomer service manager We worked on some light strengthening and balance today. She has difficulty with all balance tasks and requires HH support to prevent from falling. No reports of pain throughout session. Was given an HEP to start at home. Will benefit from ongoing  strength and balance training.   OBJECTIVE IMPAIRMENTS: Abnormal gait, decreased activity tolerance, decreased balance, decreased coordination, decreased endurance, decreased mobility, difficulty walking, decreased ROM, decreased strength, impaired flexibility, improper body mechanics, and pain.   ACTIVITY LIMITATIONS: bending, standing, squatting, stairs, and locomotion level  PARTICIPATION LIMITATIONS: school  PERSONAL FACTORS: 3+ comorbidities: Cancer survivor, CVA, concussion, DM  are also affecting patient's functional outcome.   REHAB POTENTIAL: Good  CLINICAL DECISION MAKING: Stable/uncomplicated  EVALUATION COMPLEXITY: Moderate   GOALS: Goals reviewed with patient? Yes  SHORT TERM GOALS: Target date: 12/03/2021  I with basic HEP Baseline: Goal status: INITIAL  LONG TERM GOALS: Target date: 01/28/2022   I with final HEP Baseline:  Goal status: INITIAL  2.  Patient will score at least 20/30 on FGA to demonstrate significant improvement in balance and gait. Baseline: 13/30 Goal status: INITIAL  3.  Patient will return to her previous exercise plan with pain in L knee <3/10 Baseline:  Goal status: INITIAL  4.  Patient will be able to walk at least 500' with normalized gait pattern, no antalgic symptoms on L. Baseline:  Goal status: INITIAL  5.  Increase BLE strength to at least 4/5 Baseline:  Goal status: INITIAL  PLAN:  PT FREQUENCY:  2x/week  PT DURATION: 10 weeks  PLANNED INTERVENTIONS: Therapeutic exercises, Therapeutic activity, Neuromuscular re-education, Balance training, Gait training, Patient/Family education, Self Care, Joint mobilization, Stair training, Dry Needling, and Manual therapy, [place]  PLAN FOR NEXT SESSION: Initiate HEP **No traction, estim, Ellison Carwin, MH/CP**   Marcelina Morel, DPT 11/23/2021, 12:30 PM

## 2021-11-26 ENCOUNTER — Telehealth (HOSPITAL_COMMUNITY): Payer: Medicaid Other | Admitting: Psychiatry

## 2021-11-26 ENCOUNTER — Encounter (HOSPITAL_COMMUNITY): Payer: Self-pay

## 2021-11-26 NOTE — Therapy (Incomplete)
OUTPATIENT PHYSICAL THERAPY LOWER EXTREMITY TREATMENT   Patient Name: Tracey Perkins MRN: [id number] DOB:[birth year], 36 y.o., female Today's Date: 11/23/2021   PT End of Session - 11/23/21 1143     Visit Number 2    Number of Visits 24    Date for PT Re-Evaluation 01/28/22    PT Start Time 1143    PT Stop Time 1230    PT Time Calculation (min) 47 min    Activity Tolerance Patient tolerated treatment well    Behavior During Therapy WFL for tasks assessed/performed              Past Medical History:  Diagnosis Date   Asthma    Brain tumor ([place])    Diabetes mellitus without complication ([place])    Eczema    Food allergy    Gastroparesis    Hypokalemia    Hypomagnesemia    Hypothyroid    Neuropathy    Post concussion syndrome 05/2019   Thyroid disease    Vision changes    Past Surgical History:  Procedure Laterality Date   BRAIN SURGERY     Portacath placement and removed     WISDOM TOOTH EXTRACTION     Patient Active Problem List   Diagnosis Date Noted   Tachycardia 07/12/2021   Intractable nausea and vomiting 07/12/2021   Diarrhea 07/12/2021   Diabetic neuropathy ([place]) 07/12/2021   Hypothyroidism 07/12/2021   Mild cognitive impairment 09/04/2020   Diabetic gastroparesis ([place]) 09/04/2020   Acute CVA (cerebrovascular accident) ([place]) 09/03/2020   Impulsiveness 10/14/2019   Mild episode of recurrent major depressive disorder ([place]) 09/04/2019   Anxiety 08/16/2019   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   Status post radiation therapy 11/22/2018   Seasonal and perennial allergic rhinitis 11/22/2018   Seasonal allergic conjunctivitis 11/22/2018   Acute sinusitis 11/22/2018   Keratosis pilaris 10/05/2017   Melanocytic nevi of trunk 10/05/2017   Adverse food reaction 02/09/2017   Gastroesophageal reflux disease 02/09/2017   Mild intermittent asthma with acute exacerbation 11/19/2015   Mild persistent asthma without  complication 37/62/8315   Acute seasonal allergic rhinitis due to pollen 11/16/2015   Anaphylactic shock due to adverse food reaction 11/16/2015   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   Type 1 diabetes ([place]) 11/16/2015   Dry mouth 11/16/2015   Xerosis cutis 11/16/2015   Nystagmus 08/15/2014   Visual field defect 08/15/2014   Disequilibrium 07/25/2014   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    PCP: Prince Solian  REFERRING PROVIDER: Gentry Fitz, MD   REFERRING DIAG: L distal medial ham strain and patella tendon strain   THERAPY DIAG:  Muscle weakness (generalized)  Acute pain of left knee  Other abnormalities of gait and mobility  Unsteadiness on feet  Difficulty in walking, not elsewhere classified  Other lack of coordination  Repeated falls  Rationale for Evaluation and Treatment Rehabilitation  ONSET DATE: 10/19/2021   SUBJECTIVE:   SUBJECTIVE STATEMENT: I have fallen twice since the eval, so my knee is more sore than usual. Once in the yard and then one time in the house. Not having pain unless I use the muscle.   PERTINENT HISTORY: CVA 10/22, childhood brain tumor with chemo, concussion 2 years ago, DM1 PAIN:  Are you having pain? Yes: NPRS scale: 4/10 Pain location: L knee/post knee Pain description: sharp, then takes a while to resolve Aggravating factors: maneuvering her leg Relieving factors: Heat, medication  PRECAUTIONS: None  WEIGHT BEARING  RESTRICTIONS: No  FALLS:  Has patient fallen in last 6 months? 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  PLOF: Independent  PATIENT GOALS: She would like to be able to  get dressed and perform her normal daily activities without pain and more efficiently.   OBJECTIVE:   DIAGNOSTIC FINDINGS: x rays showed subluxed patella  COGNITION: Overall cognitive status: Within functional limits for tasks assessed     SENSATION: Not tested  EDEMA:  Patient denies swelling  MUSCLE LENGTH: Hamstrings: TBD Thomas test: TBD  POSTURE: rounded shoulders and increased lumbar lordosis  PALPATION: TTP on post/med HS, patellar tendon, ITB  LOWER EXTREMITY ROM:   Generally mildly stiff, but WFL   LOWER EXTREMITY MMT:   MMT Right eval Left eval  Hip flexion 4- 3+  Hip extension 3 3  Hip abduction 3 3  Hip adduction    Hip internal rotation    Hip external rotation    Knee flexion 4 3+  Knee extension 4- 4-  Ankle dorsiflexion 4 4  Ankle plantarflexion    Ankle inversion    Ankle eversion     (Blank rows = not tested)  LOWER EXTREMITY SPECIAL TESTS:  Knee special tests: Anterior drawer test: negative, Posterior drawer test: negative, Patellafemoral apprehension test: negative, and Patellafemoral grind test: positive   FUNCTIONAL TESTS:  5 times sit to stand: 12.46 Functional gait assessment: 13/30  GAIT: Distance walked: In clinic distances Assistive device utilized: None Level of assistance: Complete Independence Comments: Slow, unsteady, step to with antalgic pattern favoring LLE. She reports her RLE used to be her strongest, but since her most recent stroke, is no longer as strong.   TODAY'S TREATMENT                                                                                                                            DATE: 11/29/21 NuStep Step ups 6" Leg ext HS curls STS on airex 2 way leg kicks  11/23/21 Bike L2 x21mns  STS 2x10 LAQ 3# 2x10 HS curls greenTB 2x10  Side steps on airex in // bars Walking on beam side steps   PATIENT EDUCATION:  Education details: POC Person educated: Patient Education method: EData processing managerEducation comprehension: verbalized understanding  HOME EXERCISE PROGRAM: Access Code: [email or web address]   ASSESSMENT:  CLINICAL IMPRESSION: Patient returns with reports of 2 falls since evaluation. She states her knee only hurts if she moves it a certain way. She also reports trouble putting on her socks, was educated about a SCustomer service manager We worked on some light strengthening and balance today. She has difficulty with all balance tasks and requires HH support to prevent from falling. No  reports of pain throughout session. Was given an HEP to start at home. Will benefit from ongoing strength and balance training.   OBJECTIVE IMPAIRMENTS: Abnormal gait, decreased activity tolerance, decreased balance, decreased coordination, decreased endurance, decreased mobility, difficulty walking, decreased ROM, decreased strength, impaired flexibility, improper body mechanics, and pain.   ACTIVITY LIMITATIONS: bending, standing, squatting, stairs, and locomotion level  PARTICIPATION LIMITATIONS: school  PERSONAL FACTORS: 3+ comorbidities: Cancer survivor, CVA, concussion, DM  are also affecting patient's functional outcome.   REHAB POTENTIAL: Good  CLINICAL DECISION MAKING: Stable/uncomplicated  EVALUATION COMPLEXITY: Moderate   GOALS: Goals reviewed with patient? Yes  SHORT TERM GOALS: Target date: 12/03/2021  I with basic HEP Baseline: Goal status: INITIAL  LONG TERM GOALS: Target date: 01/28/2022   I with final HEP Baseline:  Goal status: INITIAL  2.  Patient will score at least 20/30 on FGA to demonstrate significant improvement in balance and gait. Baseline: 13/30 Goal status: INITIAL  3.  Patient will return to her previous exercise plan with pain in L knee <3/10 Baseline:  Goal status: INITIAL  4.  Patient will be able to walk at least 500' with normalized gait pattern, no antalgic symptoms on L. Baseline:  Goal status: INITIAL  5.  Increase BLE strength to at least  4/5 Baseline:  Goal status: INITIAL  PLAN:  PT FREQUENCY: 2x/week  PT DURATION: 10 weeks  PLANNED INTERVENTIONS: Therapeutic exercises, Therapeutic activity, Neuromuscular re-education, Balance training, Gait training, Patient/Family education, Self Care, Joint mobilization, Stair training, Dry Needling, and Manual therapy, [place]  PLAN FOR NEXT SESSION: Initiate HEP **No traction, estim, Ellison Carwin, MH/CP**   Marcelina Morel, DPT 11/23/2021, 12:30 PM

## 2021-11-29 ENCOUNTER — Ambulatory Visit: Payer: Medicaid Other

## 2021-11-29 ENCOUNTER — Telehealth (HOSPITAL_COMMUNITY): Payer: Medicaid Other | Admitting: Psychiatry

## 2021-11-30 ENCOUNTER — Ambulatory Visit: Payer: Medicaid Other | Admitting: Physical Therapy

## 2021-11-30 DIAGNOSIS — M6281 Muscle weakness (generalized): Secondary | ICD-10-CM | POA: Diagnosis not present

## 2021-11-30 DIAGNOSIS — M25562 Pain in left knee: Secondary | ICD-10-CM

## 2021-11-30 DIAGNOSIS — R278 Other lack of coordination: Secondary | ICD-10-CM

## 2021-11-30 DIAGNOSIS — R262 Difficulty in walking, not elsewhere classified: Secondary | ICD-10-CM

## 2021-11-30 DIAGNOSIS — R2681 Unsteadiness on feet: Secondary | ICD-10-CM

## 2021-11-30 DIAGNOSIS — R2689 Other abnormalities of gait and mobility: Secondary | ICD-10-CM

## 2021-11-30 DIAGNOSIS — R296 Repeated falls: Secondary | ICD-10-CM

## 2021-11-30 NOTE — Therapy (Signed)
OUTPATIENT PHYSICAL THERAPY LOWER EXTREMITY TREATMENT   Patient Name: Tracey Perkins MRN: [id number] DOB:July 19, 1985, 36 y.o., female Today's Date: 11/30/2021   PT End of Session - 11/30/21 1106     Visit Number 3    Date for PT Re-Evaluation 01/28/22    PT Start Time 1102    PT Stop Time 1140    PT Time Calculation (min) 38 min    Activity Tolerance Patient tolerated treatment well    Behavior During Therapy WFL for tasks assessed/performed              Past Medical History:  Diagnosis Date   Asthma    Brain tumor ([place])    Diabetes mellitus without complication ([place])    Eczema    Food allergy    Gastroparesis    Hypokalemia    Hypomagnesemia    Hypothyroid    Neuropathy    Post concussion syndrome 05/2019   Thyroid disease    Vision changes    Past Surgical History:  Procedure Laterality Date   BRAIN SURGERY     Portacath placement and removed     WISDOM TOOTH EXTRACTION     Patient Active Problem List   Diagnosis Date Noted   Tachycardia 07/12/2021   Intractable nausea and vomiting 07/12/2021   Diarrhea 07/12/2021   Diabetic neuropathy ([place]) 07/12/2021   Hypothyroidism 07/12/2021   Mild cognitive impairment 09/04/2020   Diabetic gastroparesis ([place]) 09/04/2020   Acute CVA (cerebrovascular accident) ([place]) 09/03/2020   Impulsiveness 10/14/2019   Mild episode of recurrent major depressive disorder ([place]) 09/04/2019   Anxiety 08/16/2019   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   Status post radiation therapy 11/22/2018   Seasonal and perennial allergic rhinitis 11/22/2018   Seasonal allergic conjunctivitis 11/22/2018   Acute sinusitis 11/22/2018   Keratosis pilaris 10/05/2017   Melanocytic nevi of trunk 10/05/2017   Adverse food reaction 02/09/2017   Gastroesophageal reflux disease 02/09/2017   Mild intermittent asthma with acute exacerbation 11/19/2015   Mild persistent asthma without complication 69/67/8938    Acute seasonal allergic rhinitis due to pollen 11/16/2015   Anaphylactic shock due to adverse food reaction 11/16/2015   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   Type 1 diabetes ([place]) 11/16/2015   Dry mouth 11/16/2015   Xerosis cutis 11/16/2015   Nystagmus 08/15/2014   Visual field defect 08/15/2014   Disequilibrium 07/25/2014   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    PCP: Prince Solian  REFERRING PROVIDER: Gentry Fitz, MD   REFERRING DIAG: L distal medial ham strain and patella tendon strain   THERAPY DIAG:  Muscle weakness (generalized)  Acute pain of left knee  Other abnormalities of gait and mobility  Unsteadiness on feet  Difficulty in walking, not elsewhere classified  Repeated falls  Other lack of coordination  Rationale for Evaluation and Treatment Rehabilitation  ONSET DATE: 10/19/2021   SUBJECTIVE:   SUBJECTIVE STATEMENT: Patient reports she has had additional stumbles, but no more hard falls since last visit.   PERTINENT HISTORY: CVA 10/22, childhood brain tumor with chemo, concussion 2 years ago, DM1 PAIN:  Are you having pain? Yes: NPRS scale: 4/10 Pain location: L knee/post knee Pain description: sharp, then takes a while to resolve Aggravating factors: maneuvering her leg Relieving factors: Heat, medication  PRECAUTIONS: None  WEIGHT BEARING RESTRICTIONS: No  FALLS:  Has patient fallen in last 6 months? 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  PLOF: Independent  PATIENT GOALS: She would like to be able to get dressed and perform her normal daily activities without pain and more  efficiently.   OBJECTIVE:   DIAGNOSTIC FINDINGS: x rays showed subluxed patella  COGNITION: Overall cognitive status: Within functional limits for tasks assessed     SENSATION: Not tested  EDEMA:  Patient denies swelling  MUSCLE LENGTH: Hamstrings: TBD Thomas test: TBD  POSTURE: rounded shoulders and increased lumbar lordosis  PALPATION: TTP on post/med HS, patellar tendon, ITB  LOWER EXTREMITY ROM:   Generally mildly stiff, but WFL   LOWER EXTREMITY MMT:   MMT Right eval Left eval  Hip flexion 4- 3+  Hip extension 3 3  Hip abduction 3 3  Hip adduction    Hip internal rotation    Hip external rotation    Knee flexion 4 3+  Knee extension 4- 4-  Ankle dorsiflexion 4 4  Ankle plantarflexion    Ankle inversion    Ankle eversion     (Blank rows = not tested)  LOWER EXTREMITY SPECIAL TESTS:  Knee special tests: Anterior drawer test: negative, Posterior drawer test: negative, Patellafemoral apprehension test: negative, and Patellafemoral grind test: positive   FUNCTIONAL TESTS:  5 times sit to stand: 12.46 Functional gait assessment: 13/30  GAIT: Distance walked: In clinic distances Assistive device utilized: None Level of assistance: Complete Independence Comments: Slow, unsteady, step to with antalgic pattern favoring LLE. She reports her RLE used to be her strongest, but since her most recent stroke, is no longer as strong.   TODAY'S TREATMENT                                                                                                                            DATE: 11/30/21 NuStep L4 x 6 minutes. Step ups onto 4" step, forward, side step, crossover step, 10 reps each on LLE. Supine PROM to LLE, very slow movements with VC to relax as patient tends to tense up in anticipation of pain. HS, KTC, ER,  outer hip. Supine short kicks, 2 x 10 with 5# resistance. Bridge with Red Tband resistance at knees 10 reps Clamshells x 10  11/23/21 Bike L2 x32mns   STS 2x10 LAQ 3# 2x10 HS curls greenTB 2x10  Side steps on airex in // bars Walking on beam side steps   PATIENT EDUCATION:  Education details: POC Person educated: Patient Education method: ECustomer service managerEducation comprehension: verbalized understanding  HOME EXERCISE PROGRAM: Access Code: [email or web address]   ASSESSMENT:  CLINICAL IMPRESSION: Patient reports no more bad falls, but still losing her balance. She was able to perform some strengthening for LLE. Very fearful of pain with stretching, educated to relax with deep breathing to  maximize mobility.  OBJECTIVE IMPAIRMENTS: Abnormal gait, decreased activity tolerance, decreased balance, decreased coordination, decreased endurance, decreased mobility, difficulty walking, decreased ROM, decreased strength, impaired flexibility, improper body mechanics, and pain.   ACTIVITY LIMITATIONS: bending, standing, squatting, stairs, and locomotion level  PARTICIPATION LIMITATIONS: school  PERSONAL FACTORS: 3+ comorbidities: Cancer survivor, CVA, concussion, DM  are also affecting patient's functional outcome.   REHAB POTENTIAL: Good  CLINICAL DECISION MAKING: Stable/uncomplicated  EVALUATION COMPLEXITY: Moderate   GOALS: Goals reviewed with patient? Yes  SHORT TERM GOALS: Target date: 12/03/2021  I with basic HEP Baseline: Goal status: met  LONG TERM GOALS: Target date: 01/28/2022   I with final HEP Baseline:  Goal status: ongoing  2.  Patient will score at least 20/30 on FGA to demonstrate significant improvement in balance and gait. Baseline: 13/30 Goal status: INITIAL  3.  Patient will return to her previous exercise plan with pain in L knee <3/10 Baseline:  Goal status: INITIAL  4.  Patient will be able to walk at least 500' with normalized gait pattern, no antalgic symptoms on L. Baseline:  Goal status: INITIAL  5.  Increase BLE strength to at least 4/5 Baseline:  Goal status:  INITIAL  PLAN:  PT FREQUENCY: 2x/week  PT DURATION: 10 weeks  PLANNED INTERVENTIONS: Therapeutic exercises, Therapeutic activity, Neuromuscular re-education, Balance training, Gait training, Patient/Family education, Self Care, Joint mobilization, Stair training, Dry Needling, and Manual therapy, [place]  PLAN FOR NEXT SESSION: update HEP, L hip and LE stretching. **No traction, estim, Ellison Carwin, MH/CP**   Marcelina Morel, DPT 11/30/2021, 11:53 AM

## 2021-12-01 ENCOUNTER — Ambulatory Visit: Payer: Medicaid Other | Admitting: Physical Therapy

## 2021-12-01 DIAGNOSIS — M6281 Muscle weakness (generalized): Secondary | ICD-10-CM | POA: Diagnosis not present

## 2021-12-01 DIAGNOSIS — R2681 Unsteadiness on feet: Secondary | ICD-10-CM

## 2021-12-01 DIAGNOSIS — R262 Difficulty in walking, not elsewhere classified: Secondary | ICD-10-CM

## 2021-12-01 DIAGNOSIS — R2689 Other abnormalities of gait and mobility: Secondary | ICD-10-CM

## 2021-12-01 DIAGNOSIS — M25562 Pain in left knee: Secondary | ICD-10-CM

## 2021-12-01 DIAGNOSIS — R278 Other lack of coordination: Secondary | ICD-10-CM

## 2021-12-01 DIAGNOSIS — R296 Repeated falls: Secondary | ICD-10-CM

## 2021-12-01 NOTE — Therapy (Signed)
OUTPATIENT PHYSICAL THERAPY LOWER EXTREMITY TREATMENT   Patient Name: Tracey Perkins MRN: [id number] DOB:1985/01/21, 36 y.o., female Today's Date: 12/01/2021   PT End of Session - 12/01/21 1017     Visit Number 4    Date for PT Re-Evaluation 01/28/22    PT Start Time 1011    PT Stop Time 1050    PT Time Calculation (min) 39 min    Activity Tolerance Patient tolerated treatment well    Behavior During Therapy WFL for tasks assessed/performed               Past Medical History:  Diagnosis Date   Asthma    Brain tumor ([place])    Diabetes mellitus without complication ([place])    Eczema    Food allergy    Gastroparesis    Hypokalemia    Hypomagnesemia    Hypothyroid    Neuropathy    Post concussion syndrome 05/2019   Thyroid disease    Vision changes    Past Surgical History:  Procedure Laterality Date   BRAIN SURGERY     Portacath placement and removed     WISDOM TOOTH EXTRACTION     Patient Active Problem List   Diagnosis Date Noted   Tachycardia 07/12/2021   Intractable nausea and vomiting 07/12/2021   Diarrhea 07/12/2021   Diabetic neuropathy ([place]) 07/12/2021   Hypothyroidism 07/12/2021   Mild cognitive impairment 09/04/2020   Diabetic gastroparesis ([place]) 09/04/2020   Acute CVA (cerebrovascular accident) ([place]) 09/03/2020   Impulsiveness 10/14/2019   Mild episode of recurrent major depressive disorder ([place]) 09/04/2019   Anxiety 08/16/2019   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   Status post radiation therapy 11/22/2018   Seasonal and perennial allergic rhinitis 11/22/2018   Seasonal allergic conjunctivitis 11/22/2018   Acute sinusitis 11/22/2018   Keratosis pilaris 10/05/2017   Melanocytic nevi of trunk 10/05/2017   Adverse food reaction 02/09/2017   Gastroesophageal reflux disease 02/09/2017   Mild intermittent asthma with acute exacerbation 11/19/2015   Mild persistent asthma without complication  40/98/1191   Acute seasonal allergic rhinitis due to pollen 11/16/2015   Anaphylactic shock due to adverse food reaction 11/16/2015   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   Type 1 diabetes ([place]) 11/16/2015   Dry mouth 11/16/2015   Xerosis cutis 11/16/2015   Nystagmus 08/15/2014   Visual field defect 08/15/2014   Disequilibrium 07/25/2014   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    PCP: Prince Solian  REFERRING PROVIDER: Gentry Fitz, MD   REFERRING DIAG: L distal medial ham strain and patella tendon strain   THERAPY DIAG:  Muscle weakness (generalized)  Acute pain of left knee  Other abnormalities of gait and mobility  Unsteadiness on feet  Difficulty in walking, not elsewhere classified  Repeated falls  Other lack of coordination  Rationale for Evaluation and Treatment Rehabilitation  ONSET DATE: 10/19/2021   SUBJECTIVE:   SUBJECTIVE STATEMENT: Patient reports no problems after treatment yesterday.  PERTINENT HISTORY: CVA 10/22, childhood brain tumor with chemo, concussion 2 years ago, DM1 PAIN:  Are you having pain? Yes: NPRS scale: 4/10 Pain location: L knee/post knee Pain description: sharp, then takes a while to resolve Aggravating factors: maneuvering her leg Relieving factors: Heat, medication  PRECAUTIONS: None  WEIGHT BEARING RESTRICTIONS: No  FALLS:  Has patient fallen in last 6 months? 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  PLOF: Independent  PATIENT GOALS: She would like to be able to get dressed and perform her normal daily activities without pain and more efficiently.   OBJECTIVE:   DIAGNOSTIC  FINDINGS: x rays showed subluxed patella  COGNITION: Overall cognitive status: Within functional limits for tasks assessed     SENSATION: Not tested  EDEMA:  Patient denies swelling  MUSCLE LENGTH: Hamstrings: TBD Thomas test: TBD  POSTURE: rounded shoulders and increased lumbar lordosis  PALPATION: TTP on post/med HS, patellar tendon, ITB  LOWER EXTREMITY ROM:   Generally mildly stiff, but WFL   LOWER EXTREMITY MMT:   MMT Right eval Left eval  Hip flexion 4- 3+  Hip extension 3 3  Hip abduction 3 3  Hip adduction    Hip internal rotation    Hip external rotation    Knee flexion 4 3+  Knee extension 4- 4-  Ankle dorsiflexion 4 4  Ankle plantarflexion    Ankle inversion    Ankle eversion     (Blank rows = not tested)  LOWER EXTREMITY SPECIAL TESTS:  Knee special tests: Anterior drawer test: negative, Posterior drawer test: negative, Patellafemoral apprehension test: negative, and Patellafemoral grind test: positive   FUNCTIONAL TESTS:  5 times sit to stand: 12.46 Functional gait assessment: 13/30  GAIT: Distance walked: In clinic distances Assistive device utilized: None Level of assistance: Complete Independence Comments: Slow, unsteady, step to with antalgic pattern favoring LLE. She reports her RLE used to be her strongest, but since her most recent stroke, is no longer as strong.   TODAY'S TREATMENT                                                                                                                            DATE: 12/01/21 Bike L3 x 6 minutes, then 2 x 30 sec at L% for strength Sit to stand from elevated mat, on Airex pad, with Red Tband around knees to engage hip abd. 1 x 10 reps Seated rotation, placing both hands on her R side. Walked hands out and back x 5. Repeat to L. Seated knee flexion, 25#, 2 x 10 reps Seated knee ext, 5#, 2 x 10 reps B side stepping onto and off Airex pad in parallel bars, minimize use of UE-Patient required  occasional min A and had to grab the bars multiple times due to instability/LOB, but improved with practice.  11/30/21 NuStep L4 x 6 minutes. Step ups onto 4" step, forward, side step, crossover step, 10 reps each on LLE. Supine PROM to LLE, very slow movements with VC to relax as patient tends to tense up in anticipation of pain. HS, KTC, ER,  outer hip. Supine short kicks, 2 x 10 with 5# resistance. Bridge with Red Tband resistance at knees  10 reps Clamshells x 10  11/23/21 Bike L2 x18mns  STS 2x10 LAQ 3# 2x10 HS curls greenTB 2x10  Side steps on airex in // bars Walking on beam side steps   PATIENT EDUCATION:  Education details: POC Person educated: Patient Education method: ECustomer service managerEducation comprehension: verbalized understanding  HOME EXERCISE PROGRAM: Access Code: [email or web address]   ASSESSMENT:  CLINICAL IMPRESSION: Patient reports no issues. Treatment continued to address strength and balance training, tolerated with multiple rest breaks.  OBJECTIVE IMPAIRMENTS: Abnormal gait, decreased activity tolerance, decreased balance, decreased coordination, decreased endurance, decreased mobility, difficulty walking, decreased ROM, decreased strength, impaired flexibility, improper body mechanics, and pain.   ACTIVITY LIMITATIONS: bending, standing, squatting, stairs, and locomotion level  PARTICIPATION LIMITATIONS: school  PERSONAL FACTORS: 3+ comorbidities: Cancer survivor, CVA, concussion, DM  are also affecting patient's functional outcome.   REHAB POTENTIAL: Good  CLINICAL DECISION MAKING: Stable/uncomplicated  EVALUATION COMPLEXITY: Moderate   GOALS: Goals reviewed with patient? Yes  SHORT TERM GOALS: Target date: 12/03/2021  I with basic HEP Baseline: Goal status: met  LONG TERM GOALS: Target date: 01/28/2022   I with final HEP Baseline:  Goal status: ongoing  2.  Patient will score at least 20/30 on FGA to demonstrate significant  improvement in balance and gait. Baseline: 13/30 Goal status: INITIAL  3.  Patient will return to her previous exercise plan with pain in L knee <3/10 Baseline:  Goal status: INITIAL  4.  Patient will be able to walk at least 500' with normalized gait pattern, no antalgic symptoms on L. Baseline:  Goal status: INITIAL  5.  Increase BLE strength to at least 4/5 Baseline:  Goal status: INITIAL  PLAN:  PT FREQUENCY: 2x/week  PT DURATION: 10 weeks  PLANNED INTERVENTIONS: Therapeutic exercises, Therapeutic activity, Neuromuscular re-education, Balance training, Gait training, Patient/Family education, Self Care, Joint mobilization, Stair training, Dry Needling, and Manual therapy, [place] PLAN FOR NEXT SESSION: update HEP, L hip and LE stretching. **No traction, estim, VEllison Carwin MH/CP**   SMarcelina Morel DPT 12/01/2021, 11:00 AM

## 2021-12-06 ENCOUNTER — Ambulatory Visit: Payer: Medicaid Other

## 2021-12-06 DIAGNOSIS — M25562 Pain in left knee: Secondary | ICD-10-CM

## 2021-12-06 DIAGNOSIS — R2681 Unsteadiness on feet: Secondary | ICD-10-CM

## 2021-12-06 DIAGNOSIS — R2689 Other abnormalities of gait and mobility: Secondary | ICD-10-CM

## 2021-12-06 DIAGNOSIS — M6281 Muscle weakness (generalized): Secondary | ICD-10-CM | POA: Diagnosis not present

## 2021-12-06 DIAGNOSIS — R296 Repeated falls: Secondary | ICD-10-CM

## 2021-12-06 DIAGNOSIS — R278 Other lack of coordination: Secondary | ICD-10-CM

## 2021-12-06 DIAGNOSIS — R262 Difficulty in walking, not elsewhere classified: Secondary | ICD-10-CM

## 2021-12-06 NOTE — Therapy (Signed)
OUTPATIENT PHYSICAL THERAPY LOWER EXTREMITY TREATMENT   Patient Name: Tracey Perkins MRN: [id number] DOB:March 01, 1985, 36 y.o., female Today's Date: 12/06/2021   PT End of Session - 12/06/21 1013     Visit Number 5    Date for PT Re-Evaluation 01/28/22    PT Start Time 1015    PT Stop Time 1100    PT Time Calculation (min) 45 min    Activity Tolerance Patient tolerated treatment well    Behavior During Therapy WFL for tasks assessed/performed                Past Medical History:  Diagnosis Date   Asthma    Brain tumor ([place])    Diabetes mellitus without complication ([place])    Eczema    Food allergy    Gastroparesis    Hypokalemia    Hypomagnesemia    Hypothyroid    Neuropathy    Post concussion syndrome 05/2019   Thyroid disease    Vision changes    Past Surgical History:  Procedure Laterality Date   BRAIN SURGERY     Portacath placement and removed     WISDOM TOOTH EXTRACTION     Patient Active Problem List   Diagnosis Date Noted   Tachycardia 07/12/2021   Intractable nausea and vomiting 07/12/2021   Diarrhea 07/12/2021   Diabetic neuropathy ([place]) 07/12/2021   Hypothyroidism 07/12/2021   Mild cognitive impairment 09/04/2020   Diabetic gastroparesis ([place]) 09/04/2020   Acute CVA (cerebrovascular accident) ([place]) 09/03/2020   Impulsiveness 10/14/2019   Mild episode of recurrent major depressive disorder ([place]) 09/04/2019   Anxiety 08/16/2019   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   Status post radiation therapy 11/22/2018   Seasonal and perennial allergic rhinitis 11/22/2018   Seasonal allergic conjunctivitis 11/22/2018   Acute sinusitis 11/22/2018   Keratosis pilaris 10/05/2017   Melanocytic nevi of trunk 10/05/2017   Adverse food reaction 02/09/2017   Gastroesophageal reflux disease 02/09/2017   Mild intermittent asthma with acute exacerbation 11/19/2015   Mild persistent asthma without complication  57/47/3403   Acute seasonal allergic rhinitis due to pollen 11/16/2015   Anaphylactic shock due to adverse food reaction 11/16/2015   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   Type 1 diabetes ([place]) 11/16/2015   Dry mouth 11/16/2015   Xerosis cutis 11/16/2015   Nystagmus 08/15/2014   Visual field defect 08/15/2014   Disequilibrium 07/25/2014   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    PCP: Prince Solian  REFERRING PROVIDER: Gentry Fitz, MD   REFERRING DIAG: L distal medial ham strain and patella tendon strain   THERAPY DIAG:  Muscle weakness (generalized)  Acute pain of left knee  Other abnormalities of gait and mobility  Unsteadiness on feet  Difficulty in walking, not elsewhere classified  Repeated falls  Other lack of coordination  Rationale for Evaluation and Treatment Rehabilitation  ONSET DATE: 10/19/2021   SUBJECTIVE:   SUBJECTIVE STATEMENT: Doing good, not having pain.   PERTINENT HISTORY: CVA 10/22, childhood brain tumor with chemo, concussion 2 years ago, DM1 PAIN:  Are you having pain? Yes: NPRS scale: 0/10 Pain location: L knee/post knee Pain description: sharp, then takes a while to resolve Aggravating factors: maneuvering her leg Relieving factors: Heat, medication  PRECAUTIONS: None  WEIGHT BEARING RESTRICTIONS: No  FALLS:  Has patient fallen in last 6 months? 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  PLOF: Independent  PATIENT GOALS: She would like to be able to get dressed and perform her normal daily activities without pain and more efficiently.   OBJECTIVE:   DIAGNOSTIC FINDINGS: x rays showed  subluxed patella  COGNITION: Overall cognitive status: Within functional limits for tasks assessed     SENSATION: Not tested  EDEMA:  Patient denies swelling  MUSCLE LENGTH: Hamstrings: TBD Thomas test: TBD  POSTURE: rounded shoulders and increased lumbar lordosis  PALPATION: TTP on post/med HS, patellar tendon, ITB  LOWER EXTREMITY ROM:   Generally mildly stiff, but WFL   LOWER EXTREMITY MMT:   MMT Right eval Left eval  Hip flexion 4- 3+  Hip extension 3 3  Hip abduction 3 3  Hip adduction    Hip internal rotation    Hip external rotation    Knee flexion 4 3+  Knee extension 4- 4-  Ankle dorsiflexion 4 4  Ankle plantarflexion    Ankle inversion    Ankle eversion     (Blank rows = not tested)  LOWER EXTREMITY SPECIAL TESTS:  Knee special tests: Anterior drawer test: negative, Posterior drawer test: negative, Patellafemoral apprehension test: negative, and Patellafemoral grind test: positive   FUNCTIONAL TESTS:  5 times sit to stand: 12.46 Functional gait assessment: 13/30  GAIT: Distance walked: In clinic distances Assistive device utilized: None Level of assistance: Complete Independence Comments: Slow, unsteady, step to with antalgic pattern favoring LLE. She reports her RLE used to be her strongest, but since her most recent stroke, is no longer as strong.   TODAY'S TREATMENT                                                                                                                            DATE: 12/06/21 NuStep L5 x33mns  S2S on airex 2x10  Leg ext 10# 2x10 HS curl 25# 2x10  Calf stretch 30s  Calf raises 2x10  Step ups 4" 3 way leg kicks with 2# x10   12/01/21 Bike L3 x 6 minutes, then 2 x 30 sec at L% for strength Sit to stand from elevated mat, on Airex pad, with Red Tband around knees to engage hip abd. 1 x 10 reps Seated rotation, placing both hands on her R side. Walked hands out and back x 5. Repeat to L. Seated knee flexion,  25#, 2 x 10 reps Seated knee ext, 5#, 2 x 10 reps B side stepping onto and off Airex pad in parallel bars, minimize use of UE-Patient required occasional min A and had to grab the bars multiple times due to instability/LOB, but improved with practice.  11/30/21 NuStep L4 x 6 minutes. Step ups onto 4" step, forward, side step, crossover step, 10 reps each on LLE. Supine PROM to LLE,  very slow movements with VC to relax as patient tends to tense up in anticipation of pain. HS, KTC, ER,  outer hip. Supine short kicks, 2 x 10 with 5# resistance. Bridge with Red Tband resistance at knees 10 reps Clamshells x 10  11/23/21 Bike L2 x83mns  STS 2x10 LAQ 3# 2x10 HS curls greenTB 2x10  Side steps on airex in // bars Walking on beam side steps   PATIENT EDUCATION:  Education details: POC Person educated: Patient Education method: ECustomer service managerEducation comprehension: verbalized understanding  HOME EXERCISE PROGRAM: Access Code: [email or web address]   ASSESSMENT:  CLINICAL IMPRESSION: Patient reports no issues. Treatment continued to address strength and balance training, tolerated with multiple rest breaks. Most fatigue with step ups on 4", requires CGA due to unsteadiness on feet.   OBJECTIVE IMPAIRMENTS: Abnormal gait, decreased activity tolerance, decreased balance, decreased coordination, decreased endurance, decreased mobility, difficulty walking, decreased ROM, decreased strength, impaired flexibility, improper body mechanics, and pain.   ACTIVITY LIMITATIONS: bending, standing, squatting, stairs, and locomotion level  PARTICIPATION LIMITATIONS: school  PERSONAL FACTORS: 3+ comorbidities: Cancer survivor, CVA, concussion, DM  are also affecting patient's functional outcome.   REHAB POTENTIAL: Good  CLINICAL DECISION MAKING: Stable/uncomplicated  EVALUATION COMPLEXITY: Moderate   GOALS: Goals reviewed with patient? Yes  SHORT TERM GOALS: Target date: 12/03/2021  I  with basic HEP Baseline: Goal status: met  LONG TERM GOALS: Target date: 01/28/2022   I with final HEP Baseline:  Goal status: ongoing  2.  Patient will score at least 20/30 on FGA to demonstrate significant improvement in balance and gait. Baseline: 13/30 Goal status: INITIAL  3.  Patient will return to her previous exercise plan with pain in L knee <3/10 Baseline:  Goal status: INITIAL  4.  Patient will be able to walk at least 500' with normalized gait pattern, no antalgic symptoms on L. Baseline:  Goal status: INITIAL  5.  Increase BLE strength to at least 4/5 Baseline:  Goal status: INITIAL  PLAN:  PT FREQUENCY: 2x/week  PT DURATION: 10 weeks  PLANNED INTERVENTIONS: Therapeutic exercises, Therapeutic activity, Neuromuscular re-education, Balance training, Gait training, Patient/Family education, Self Care, Joint mobilization, Stair training, Dry Needling, and Manual therapy, [place] PLAN FOR NEXT SESSION: update HEP, L hip and LE stretching. **No traction, estim, VEllison Carwin MH/CP**   SMarcelina Morel DPT 12/06/2021, 10:57 AM

## 2021-12-12 ENCOUNTER — Other Ambulatory Visit: Payer: Self-pay

## 2021-12-12 ENCOUNTER — Emergency Department (HOSPITAL_COMMUNITY)
Admission: EM | Admit: 2021-12-12 | Discharge: 2021-12-12 | Disposition: A | Discharge status: Home / Self Care | Payer: Medicaid Other | Attending: Emergency Medicine | Admitting: Emergency Medicine

## 2021-12-12 ENCOUNTER — Emergency Department (HOSPITAL_COMMUNITY): Payer: Medicaid Other

## 2021-12-12 ENCOUNTER — Encounter (HOSPITAL_COMMUNITY): Payer: Self-pay

## 2021-12-12 DIAGNOSIS — Z794 Long term (current) use of insulin: Secondary | ICD-10-CM | POA: Diagnosis not present

## 2021-12-12 DIAGNOSIS — Z1152 Encounter for screening for COVID-19: Secondary | ICD-10-CM | POA: Insufficient documentation

## 2021-12-12 DIAGNOSIS — Z7982 Long term (current) use of aspirin: Secondary | ICD-10-CM | POA: Insufficient documentation

## 2021-12-12 DIAGNOSIS — E109 Type 1 diabetes mellitus without complications: Secondary | ICD-10-CM | POA: Diagnosis not present

## 2021-12-12 DIAGNOSIS — R197 Diarrhea, unspecified: Secondary | ICD-10-CM | POA: Diagnosis not present

## 2021-12-12 DIAGNOSIS — E039 Hypothyroidism, unspecified: Secondary | ICD-10-CM | POA: Insufficient documentation

## 2021-12-12 DIAGNOSIS — R112 Nausea with vomiting, unspecified: Secondary | ICD-10-CM | POA: Insufficient documentation

## 2021-12-12 LAB — CBC
HCT: 42.3 % (ref 36.0–46.0)
Hemoglobin: 13.6 g/dL (ref 12.0–15.0)
MCH: 29.9 pg (ref 26.0–34.0)
MCHC: 32.2 g/dL (ref 30.0–36.0)
MCV: 93 fL (ref 80.0–100.0)
Platelets: 397 10*3/uL (ref 150–400)
RBC: 4.55 MIL/uL (ref 3.87–5.11)
RDW: 13.8 % (ref 11.5–15.5)
WBC: 10.9 10*3/uL — ABNORMAL HIGH (ref 4.0–10.5)
nRBC: 0 % (ref 0.0–0.2)

## 2021-12-12 LAB — URINALYSIS, ROUTINE W REFLEX MICROSCOPIC
Bacteria, UA: NONE SEEN
Bilirubin Urine: NEGATIVE
Glucose, UA: NEGATIVE mg/dL
Ketones, ur: NEGATIVE mg/dL
Leukocytes,Ua: NEGATIVE
Nitrite: NEGATIVE
Protein, ur: 30 mg/dL — AB
RBC / HPF: >50 RBC/hpf — ABNORMAL HIGH (ref 0–5)
Specific Gravity, Urine: 1.019 (ref 1.005–1.030)
pH: 7 (ref 5.0–8.0)

## 2021-12-12 LAB — CBG MONITORING, ED
Glucose-Capillary: 133 mg/dL — ABNORMAL HIGH (ref 70–99)
Glucose-Capillary: 185 mg/dL — ABNORMAL HIGH (ref 70–99)

## 2021-12-12 LAB — RESP PANEL BY RT-PCR (FLU A&B, COVID) ARPGX2
Influenza A by PCR: NEGATIVE
Influenza B by PCR: NEGATIVE
SARS Coronavirus 2 by RT PCR: NEGATIVE

## 2021-12-12 LAB — LIPASE, BLOOD: Lipase: 32 U/L (ref 11–51)

## 2021-12-12 LAB — COMPREHENSIVE METABOLIC PANEL
ALT: 20 U/L (ref 0–44)
AST: 28 U/L (ref 15–41)
Albumin: 3.9 g/dL (ref 3.5–5.0)
Alkaline Phosphatase: 101 U/L (ref 38–126)
Anion gap: 13 (ref 5–15)
BUN: 12 mg/dL (ref 6–20)
CO2: 27 mmol/L (ref 22–32)
Calcium: 8.9 mg/dL (ref 8.9–10.3)
Chloride: 98 mmol/L (ref 98–111)
Creatinine, Ser: 1.07 mg/dL — ABNORMAL HIGH (ref 0.44–1.00)
GFR, Estimated: >60 mL/min (ref >60)
Glucose, Bld: 154 mg/dL — ABNORMAL HIGH (ref 70–99)
Potassium: 4.4 mmol/L (ref 3.5–5.1)
Sodium: 138 mmol/L (ref 135–145)
Total Bilirubin: 0.7 mg/dL (ref 0.3–1.2)
Total Protein: 7.2 g/dL (ref 6.5–8.1)

## 2021-12-12 LAB — PREGNANCY, URINE: Preg Test, Ur: NEGATIVE

## 2021-12-12 LAB — LACTIC ACID, PLASMA: Lactic Acid, Venous: 1.1 mmol/L (ref 0.5–1.9)

## 2021-12-12 LAB — I-STAT BETA HCG BLOOD, ED (MC, WL, AP ONLY): I-stat hCG, quantitative: <5 m[IU]/mL (ref <5)

## 2021-12-12 MED ORDER — SODIUM CHLORIDE 0.9 % IV BOLUS
1000.0000 mL | Freq: Once | INTRAVENOUS | Status: AC
  Administered 2021-12-12: 1000 mL via INTRAVENOUS

## 2021-12-12 MED ORDER — IBUPROFEN 400 MG PO TABS
400.0000 mg | ORAL_TABLET | Freq: Once | ORAL | Status: AC
  Administered 2021-12-12: 400 mg via ORAL
  Filled 2021-12-12: qty 1

## 2021-12-12 MED ORDER — ONDANSETRON HCL 4 MG/2ML IJ SOLN
4.0000 mg | Freq: Once | INTRAMUSCULAR | Status: AC
  Administered 2021-12-12: 4 mg via INTRAVENOUS
  Filled 2021-12-12: qty 2

## 2021-12-12 MED ORDER — ACETAMINOPHEN 500 MG PO TABS
1000.0000 mg | ORAL_TABLET | Freq: Once | ORAL | Status: AC
  Administered 2021-12-12: 1000 mg via ORAL
  Filled 2021-12-12: qty 2

## 2021-12-12 MED ORDER — ACETAMINOPHEN 325 MG PO TABS
650.0000 mg | ORAL_TABLET | Freq: Once | ORAL | Status: AC | PRN
  Administered 2021-12-12: 650 mg via ORAL
  Filled 2021-12-12: qty 2

## 2021-12-12 NOTE — ED Provider Notes (Signed)
[facility] EMERGENCY DEPARTMENT Provider Note   CSN: [id number] Arrival date & time: 12/12/21  0813     History  No chief complaint on file.   Tracey Perkins is a 36 y.o. female.  Patient with history of T1DM with insulin pump in place, gastroparesis, CVA presents today with complaints of nausea, vomiting, and diarrhea. She states that same began suddenly this morning with several bouts of NBNB emesis as well as diarrhea. She has also been running a fever. She states that her family has recently been sick with similar symptoms. She is unable to tolerate oral intake and has therefore had difficulty controlling her blood sugars. States that she did turn her insulin pump down with some success. States that her sugars are currently in the 300s which is concerning for her. She denies any abdominal pain or dysuria.  The history is provided by the patient. No language interpreter was used.       Home Medications Prior to Admission medications   Medication Sig Start Date End Date Taking? Authorizing Provider  acetaminophen (TYLENOL) 500 MG tablet Take 500 mg by mouth every 6 (six) hours as needed for mild pain or headache.    [provider]  aspirin EC 81 MG tablet Take 81 mg by mouth daily. Swallow whole.    [provider]  Azelastine HCl 137 MCG/SPRAY SOLN PLACE 2 SPRAYS INTO BOTH NOSTRILS 2 (TWO) TIMES DAILY Patient taking differently: Place 2 sprays into both nostrils 2 (two) times daily. 06/09/21   Dara Hoyer, FNP  citalopram (CELEXA) 10 MG tablet Take 0.5 tablets (5 mg total) by mouth at bedtime. 09/02/21   Salley Slaughter, NP  Continuous Blood Gluc Sensor (DEXCOM G6 SENSOR) MISC Apply 1 Device topically See admin instructions. Place 1 sensor into the skin every 10 days 06/03/21   [provider]  Continuous Blood Gluc Transmit (DEXCOM G6 TRANSMITTER) MISC USE 1 EACH EVERY 3 (THREE) MONTHS 01/26/21   [provider]  DERMOTIC  0.01 % OIL Place 4 drops into both ears See admin instructions. Place 4 drops into both ears 2 times a week as needed/as directed 05/15/20   [provider]  EPINEPHrine 0.3 mg/0.3 mL IJ SOAJ injection Use as directed for severe allergic reaction. 01/29/21   Ambs, Kathrine Cords, FNP  FLORASTOR 250 MG capsule Take 250 mg by mouth in the morning.    [provider]  fluconazole (DIFLUCAN) 150 MG tablet Take 150 mg by mouth as needed (as directed, for yeast infections). 05/21/21   [provider]  fluticasone (FLONASE) 50 MCG/ACT nasal spray 2 sprays per nostril daily as needed for stuffy nose. Patient taking differently: Place 2 sprays into both nostrils daily. 06/09/21   Dara Hoyer, FNP  fluticasone (FLOVENT HFA) 110 MCG/ACT inhaler Inhale 2 puffs into the lungs 2 (two) times daily as needed (for asthma flares, until cough and wheeze-free). 07/14/21   Aline August, MD  gabapentin (NEURONTIN) 300 MG capsule Take 300-600 mg by mouth See admin instructions. Take 300 mg by mouth in the morning and 600 mg at bedtime 11/13/19 06/03/22  [provider]  Glucagon 3 MG/DOSE POWD Place 3 mg into the nose as needed (low blood sugar).    [provider]  insulin aspart (NOVOLOG) 100 UNIT/ML injection Inject into the skin See admin instructions. Per pump 12/24/19   [provider]  Insulin Disposable Pump (OMNIPOD 5 G6 POD, GEN 5,) MISC Inject 1 Device  into the skin See admin instructions. Place 1 pod into the skin every 2-3 days 06/04/21   [provider]  levothyroxine (SYNTHROID) 88 MCG tablet Take 88 mcg by mouth daily before breakfast. 04/05/21   [provider]  loratadine (CLARITIN) 10 MG tablet Take 1 tablet (10 mg total) by mouth daily as needed for allergies. Patient taking differently: Take 10 mg by mouth in the morning. 06/09/21   Ambs, Kathrine Cords, FNP  lubiprostone (AMITIZA) 8 MCG capsule Take 16 mcg by mouth daily with breakfast.     [provider]  magnesium oxide (MAG-OX) 400 (240 Mg) MG tablet Take 800 mg by mouth at bedtime.    [provider]  metoCLOPramide (REGLAN) 5 MG tablet Take 1 tablet (5 mg total) by mouth 3 (three) times daily before meals for 10 days. 07/14/21 07/24/21  Aline August, MD  montelukast (SINGULAIR) 10 MG tablet TAKE 1 TABLET (10 MG TOTAL) BY MOUTH AT BEDTIME. TO PREVENT COUGHING OR WHEEZING 08/24/21   Ambs, Kathrine Cords, FNP  Omega-3 Fatty Acids (FISH OIL PO) Take 1 capsule by mouth daily with supper.    [provider]  ondansetron (ZOFRAN) 4 MG tablet Take 1 tablet (4 mg total) by mouth every 8 (eight) hours as needed for nausea or vomiting. 07/14/21   Aline August, MD  pantoprazole (PROTONIX) 20 MG tablet Take 1 tablet (20 mg total) by mouth daily. Patient taking differently: Take 20 mg by mouth daily as needed for heartburn or indigestion. 06/09/21 06/09/22  Dara Hoyer, FNP  Polyethyl Glycol-Propyl Glycol (SYSTANE OP) Place 1 drop into both eyes 3 (three) times daily as needed (for dryness).    [provider]  potassium chloride (KLOR-CON) 10 MEQ tablet Take 10 mEq by mouth daily. 03/23/21   [provider]  pravastatin (PRAVACHOL) 20 MG tablet Take 20 mg by mouth daily after supper.    [provider]  PROAIR HFA 108 [phone] Base) MCG/ACT inhaler Inhale 2 puffs into the lungs every 4 (four) hours as needed for wheezing or shortness of breath. TAKE 2 PUFFS BY MOUTH EVERY 4 HOURS AS NEEDED Strength: 108 (90 Base) MCG/ACT Patient taking differently: Inhale 2 puffs into the lungs every 4 (four) hours as needed for wheezing or shortness of breath. 06/09/21   Ambs, Kathrine Cords, FNP  vitamin B-12 (CYANOCOBALAMIN) 1000 MCG tablet Take 1,000 mcg by mouth every 7 (seven) days.    [provider]      Allergies    Banana, Bactrim [sulfamethoxazole-trimethoprim], Doxycycline, Gentian violet, Gentian violet-proflavine sulfate [triple dye], Mold extract [trichophyton],  Morphine and related, Other, and Vancomycin    Review of Systems   Review of Systems  Gastrointestinal:  Positive for diarrhea, nausea and vomiting.  All other systems reviewed and are negative.   Physical Exam Updated Vital Signs BP 120/87   Pulse (!) 135   Temp (!) 103 F (39.4 C) (Oral)   Resp 18   SpO2 93%  Physical Exam Vitals and nursing note reviewed.  Constitutional:      General: She is not in acute distress.    Appearance: Normal appearance. She is normal weight. She is not ill-appearing, toxic-appearing or diaphoretic.  HENT:     Head: Normocephalic and atraumatic.  Cardiovascular:     Rate and Rhythm: Normal rate.  Pulmonary:     Effort: Pulmonary effort is normal. No respiratory distress.  Abdominal:     General: Abdomen is flat.  Palpations: Abdomen is soft.     Tenderness: There is no abdominal tenderness.  Musculoskeletal:        General: Normal range of motion.     Cervical back: Normal range of motion.  Skin:    General: Skin is warm and dry.  Neurological:     General: No focal deficit present.     Mental Status: She is alert.  Psychiatric:        Mood and Affect: Mood normal.        Behavior: Behavior normal.     ED Results / Procedures / Treatments   Labs (all labs ordered are listed, but only abnormal results are displayed) Labs Reviewed  COMPREHENSIVE METABOLIC PANEL - Abnormal; Notable for the following components:      Result Value   Glucose, Bld 154 (*)    Creatinine, Ser 1.07 (*)    All other components within normal limits  CBC - Abnormal; Notable for the following components:   WBC 10.9 (*)    All other components within normal limits  URINALYSIS, ROUTINE W REFLEX MICROSCOPIC - Abnormal; Notable for the following components:   APPearance HAZY (*)    Hgb urine dipstick LARGE (*)    Protein, ur 30 (*)    RBC / HPF >50 (*)    All other components within normal limits  CBG MONITORING, ED - Abnormal; Notable for the following  components:   Glucose-Capillary 133 (*)    All other components within normal limits  CBG MONITORING, ED - Abnormal; Notable for the following components:   Glucose-Capillary 185 (*)    All other components within normal limits  RESP PANEL BY RT-PCR (FLU A&B, COVID) ARPGX2  GASTROINTESTINAL PANEL BY PCR, STOOL (REPLACES STOOL CULTURE)  CULTURE, BLOOD (ROUTINE X 2)  CULTURE, BLOOD (ROUTINE X 2)  LIPASE, BLOOD  PREGNANCY, URINE  LACTIC ACID, PLASMA  LACTIC ACID, PLASMA  I-STAT BETA HCG BLOOD, ED ([facility], [facility], [facility] ONLY)    EKG None  Radiology No results found.  Procedures Procedures    Medications Ordered in ED Medications  acetaminophen (TYLENOL) tablet 650 mg (650 mg Oral Given 12/12/21 1321)  sodium chloride 0.9 % bolus 1,000 mL (0 mLs Intravenous Stopped 12/12/21 2049)  ondansetron (ZOFRAN) injection 4 mg (4 mg Intravenous Given 12/12/21 1820)  ibuprofen (ADVIL) tablet 400 mg (400 mg Oral Given 12/12/21 1859)  sodium chloride 0.9 % bolus 1,000 mL (1,000 mLs Intravenous New Bag/Given 12/12/21 2130)  acetaminophen (TYLENOL) tablet 1,000 mg (1,000 mg Oral Given 12/12/21 2133)    ED Course/ Medical Decision Making/ A&P                           Medical Decision Making Amount and/or Complexity of Data Reviewed Labs: ordered. Radiology: ordered.  Risk OTC drugs. Prescription drug management.   This patient is a 36 y.o. female who presents to the ED for concern of nausea, vomiting, and diarrhea, this involves an extensive number of treatment options, and is a complaint that carries with it a high risk of complications and morbidity. The emergent differential diagnosis prior to evaluation includes, but is not limited to,  DKA, gastroparesis, gastroenteritis .   This is not an exhaustive differential.   Past Medical History / Co-morbidities / Social History: Hx T1DM, CVA, hypothyroidism   Physical Exam: Physical exam performed. The pertinent findings include: abdomen soft  and nontender  Lab Tests: I ordered, and personally interpreted labs.  The pertinent results include:  CBG 133, WBC 10.9, lactic 1.1. Blood cultures pending. No other acute laboratory findings   Imaging Studies: I ordered imaging studies including CXR. I independently visualized and interpreted imaging which showed NAD. I agree with the radiologist interpretation.   Cardiac Monitoring:  The patient was maintained on a cardiac monitor.  My attending physician Dr. Maylon Peppers viewed and interpreted the cardiac monitored which showed an underlying rhythm of: NSR. I agree with this interpretation.   Medications: I ordered medication including zofran, tylenol, ibuprofen, fluids  for nausea, fever, dehydration. Reevaluation of the patient after these medicines showed that the patient resolved. I have reviewed the patients home medicines and have made adjustments as needed.  Disposition:  Patient presents today with complaints of nausea, vomiting, and diarrhea since this morning. Initially patient was febrile and tachycardic which improved with fluids and pain medication. Physical exam reveals abdomen that is soft and non-tender. After 2 liters of fluids and tylenol and ibuprofen and zofran, patient states she is feeling much better. She is now able to eat crackers and apple sauce and drink water without any additional episodes of nausea or vomiting. She has not had diarrhea since coming in either. She has had a few soft pressures, however is not septic. Offered CT imaging, however with overall reassuring labs and physical exam findings, this would be low yield. Patient and mom state that they really want to go home if possible. Her blood sugars have been well managed throughout her visit and she is showing no signs of DKA. Given this, she is stable to be discharged. Patient and mom are understanding and amenable with plan, close return precautions given and close pcp follow-up recommended. Patient states that  she already has plenty of anti-nausea medication at home and does not require refills. Patient discharged in stable condition.  Findings and plan of care discussed with supervising physician Dr. Maylon Peppers who is in agreement.    Final Clinical Impression(s) / ED Diagnoses Final diagnoses:  Nausea vomiting and diarrhea    Rx / DC Orders ED Discharge Orders     None     An After Visit Summary was printed and given to the patient.     Bud Face, PA-C 12/13/21 0002    Leanord Asal K, DO 12/15/21 1511

## 2021-12-12 NOTE — ED Provider Triage Note (Signed)
Emergency Medicine Provider Triage Evaluation Note  Tracey Perkins , a 36 y.o. female  was evaluated in triage.  Pt complains of nausea, vomiting , and diarrhea.  Symptoms started this morning and all of a sudden.  She states she has not been able to keep anything down since this morning.  States her mother has similar symptoms a couple days ago who she has had close contact with the last 48 hours.  Denies fever. denies abdominal pain.  Denies AMS.  Patient is diabetic with insulin pump.  States that mother turned down her basal rate earlier this morning since she had so much vomiting diarrhea nausea.  Point-of-care glucose here in the ED is 133.  Review of Systems  Positive: See above Negative: See above  Physical Exam  BP (!) 145/91 (BP Location: Right Arm)   Pulse (!) 135   Temp 99.8 F (37.7 C)   Resp 18   SpO2 93%  Gen:   Awake, no distress   Resp:  Normal effort  MSK:   Moves extremities without difficulty  Other:    Medical Decision Making  Medically screening exam initiated at 8:56 AM.  Appropriate orders placed.  [name] was informed that the remainder of the evaluation will be completed by another provider, this initial triage assessment does not replace that evaluation, and the importance of remaining in the ED until their evaluation is complete.  Work up [employer], Alfredo Batty, PA-C 12/12/21 [phone]

## 2021-12-12 NOTE — Discharge Instructions (Addendum)
As we discussed, your work-up in the ER today was reassuring for acute findings. Given that your symptoms improved with fluids and anti-nausea medication, you are stable to be discharged. I recommend close monitoring of your condition and your blood sugars and close follow-up with your primary care doctor for continued evaluation and management of your symptoms  Return if development of any new or worsening symptoms.

## 2021-12-12 NOTE — ED Triage Notes (Signed)
Patient complains of nausea, vomiting and diarrhea; family members have same. Patient is diabetic. Complains of nausea. Abdominal cramping. Alert and oriented

## 2021-12-12 NOTE — ED Notes (Signed)
Portable xray at bedside.

## 2021-12-14 ENCOUNTER — Ambulatory Visit: Payer: Medicaid Other | Admitting: Physical Therapy

## 2021-12-17 ENCOUNTER — Ambulatory Visit: Payer: Medicaid Other | Attending: Family Medicine | Admitting: Physical Therapy

## 2021-12-17 ENCOUNTER — Encounter: Payer: Self-pay | Admitting: Physical Therapy

## 2021-12-17 DIAGNOSIS — R262 Difficulty in walking, not elsewhere classified: Secondary | ICD-10-CM | POA: Diagnosis present

## 2021-12-17 DIAGNOSIS — R2681 Unsteadiness on feet: Secondary | ICD-10-CM | POA: Insufficient documentation

## 2021-12-17 DIAGNOSIS — R2689 Other abnormalities of gait and mobility: Secondary | ICD-10-CM | POA: Diagnosis present

## 2021-12-17 DIAGNOSIS — M6281 Muscle weakness (generalized): Secondary | ICD-10-CM | POA: Diagnosis present

## 2021-12-17 DIAGNOSIS — M25562 Pain in left knee: Secondary | ICD-10-CM | POA: Insufficient documentation

## 2021-12-17 DIAGNOSIS — R278 Other lack of coordination: Secondary | ICD-10-CM | POA: Diagnosis present

## 2021-12-17 DIAGNOSIS — R296 Repeated falls: Secondary | ICD-10-CM | POA: Diagnosis present

## 2021-12-17 LAB — CULTURE, BLOOD (ROUTINE X 2)
Culture: NO GROWTH
Culture: NO GROWTH

## 2021-12-17 NOTE — Therapy (Signed)
OUTPATIENT PHYSICAL THERAPY LOWER EXTREMITY TREATMENT   Patient Name: Tracey Perkins MRN: [id number] DOB:Jun 12, 1985, 36 y.o., female Today's Date: 12/17/2021   PT End of Session - 12/17/21 0929     Visit Number 6    Date for PT Re-Evaluation 01/28/22    PT Start Time 0926    PT Stop Time 1010    PT Time Calculation (min) 44 min    Activity Tolerance Patient tolerated treatment well    Behavior During Therapy [facility] for tasks assessed/performed                 Past Medical History:  Diagnosis Date   Asthma    Brain tumor ([place])    Diabetes mellitus without complication ([place])    Eczema    Food allergy    Gastroparesis    Hypokalemia    Hypomagnesemia    Hypothyroid    Neuropathy    Post concussion syndrome 05/2019   Thyroid disease    Vision changes    Past Surgical History:  Procedure Laterality Date   BRAIN SURGERY     Portacath placement and removed     WISDOM TOOTH EXTRACTION     Patient Active Problem List   Diagnosis Date Noted   Tachycardia 07/12/2021   Intractable nausea and vomiting 07/12/2021   Diarrhea 07/12/2021   Diabetic neuropathy ([place]) 07/12/2021   Hypothyroidism 07/12/2021   Mild cognitive impairment 09/04/2020   Diabetic gastroparesis ([place]) 09/04/2020   Acute CVA (cerebrovascular accident) ([place]) 09/03/2020   Impulsiveness 10/14/2019   Mild episode of recurrent major depressive disorder ([place]) 09/04/2019   Anxiety 08/16/2019   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   Status post radiation therapy 11/22/2018   Seasonal and perennial allergic rhinitis 11/22/2018   Seasonal allergic conjunctivitis 11/22/2018   Acute sinusitis 11/22/2018   Keratosis pilaris 10/05/2017   Melanocytic nevi of trunk 10/05/2017   Adverse food reaction 02/09/2017   Gastroesophageal reflux disease 02/09/2017   Mild intermittent asthma with acute exacerbation 11/19/2015   Mild persistent asthma without complication  89/37/3428   Acute seasonal allergic rhinitis due to pollen 11/16/2015   Anaphylactic shock due to adverse food reaction 11/16/2015   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   Type 1 diabetes ([place]) 11/16/2015   Dry mouth 11/16/2015   Xerosis cutis 11/16/2015   Nystagmus 08/15/2014   Visual field defect 08/15/2014   Disequilibrium 07/25/2014   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    PCP: Prince Solian  REFERRING PROVIDER: Gentry Fitz, MD   REFERRING DIAG: L distal medial ham strain and patella tendon strain   THERAPY DIAG:  Muscle weakness (generalized)  Acute pain of left knee  Other abnormalities of gait and mobility  Unsteadiness on feet  Difficulty in walking, not elsewhere classified  Repeated falls  Other lack of coordination  Rationale for Evaluation and Treatment Rehabilitation  ONSET DATE: 10/19/2021   SUBJECTIVE:   SUBJECTIVE STATEMENT: Patient reports that she was sick with the flu last week. It ran through her family. She feels recovered from the illness, but still has some weakness.  PERTINENT HISTORY: CVA 10/22, childhood brain tumor with chemo, concussion 2 years ago, DM1 PAIN:  Are you having pain? Yes: NPRS scale: 0/10 Pain location: L knee/post knee Pain description: sharp, then takes a while to resolve Aggravating factors: maneuvering her leg Relieving factors: Heat, medication  PRECAUTIONS: None  WEIGHT BEARING RESTRICTIONS: No  FALLS:  Has patient fallen in last 6 months?  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  PLOF: Independent  PATIENT GOALS: She would like to be able to get dressed and perform  her normal daily activities without pain and more efficiently.   OBJECTIVE:   DIAGNOSTIC FINDINGS: x rays showed subluxed patella  COGNITION: Overall cognitive status: Within functional limits for tasks assessed     SENSATION: Not tested  EDEMA:  Patient denies swelling  MUSCLE LENGTH: Hamstrings: TBD Thomas test: TBD  POSTURE: rounded shoulders and increased lumbar lordosis  PALPATION: TTP on post/med HS, patellar tendon, ITB  LOWER EXTREMITY ROM:   Generally mildly stiff, but WFL   LOWER EXTREMITY MMT:   MMT Right eval Left eval  Hip flexion 4- 3+  Hip extension 3 3  Hip abduction 3 3  Hip adduction    Hip internal rotation    Hip external rotation    Knee flexion 4 3+  Knee extension 4- 4-  Ankle dorsiflexion 4 4  Ankle plantarflexion    Ankle inversion    Ankle eversion     (Blank rows = not tested)  LOWER EXTREMITY SPECIAL TESTS:  Knee special tests: Anterior drawer test: negative, Posterior drawer test: negative, Patellafemoral apprehension test: negative, and Patellafemoral grind test: positive   FUNCTIONAL TESTS:  5 times sit to stand: 12.46 Functional gait assessment: 13/30  GAIT: Distance walked: In clinic distances Assistive device utilized: None Level of assistance: Complete Independence Comments: Slow, unsteady, step to with antalgic pattern favoring LLE. She reports her RLE used to be her strongest, but since her most recent stroke, is no longer as strong.   TODAY'S TREATMENT                                                                                                                            DATE: 12/17/21 Bike L3 x 6 minutes Reviewed HEP and practiced the balance exercises standing at counter. She has progressed past standing with narrow BOS and she does not have a pillow for the sit to stand exercise. Moved to parallel bars- stood on upside down BOSU with BUE support, attempting to release hands, able to go for up to 5 sec. Step  ups onto 6" step, 2 x 5 reps each leg. Heel raises on a step, 2 x 10 reps, B, with UE support  12/06/21 NuStep L5 x21mns  S2S on airex 2x10  Leg ext 10# 2x10 HS curl 25# 2x10  Calf stretch 30s  Calf raises 2x10  Step ups 4" 3 way leg kicks with 2# x10   12/01/21 Bike L3 x 6 minutes, then 2 x 30 sec at L% for strength Sit to stand from elevated mat, on Airex pad,  with Red Tband around knees to engage hip abd. 1 x 10 reps Seated rotation, placing both hands on her R side. Walked hands out and back x 5. Repeat to L. Seated knee flexion, 25#, 2 x 10 reps Seated knee ext, 5#, 2 x 10 reps B side stepping onto and off Airex pad in parallel bars, minimize use of UE-Patient required occasional min A and had to grab the bars multiple times due to instability/LOB, but improved with practice.  11/30/21 NuStep L4 x 6 minutes. Step ups onto 4" step, forward, side step, crossover step, 10 reps each on LLE. Supine PROM to LLE, very slow movements with VC to relax as patient tends to tense up in anticipation of pain. HS, KTC, ER,  outer hip. Supine short kicks, 2 x 10 with 5# resistance. Bridge with Red Tband resistance at knees 10 reps Clamshells x 10  11/23/21 Bike L2 x8mns  STS 2x10 LAQ 3# 2x10 HS curls greenTB 2x10  Side steps on airex in // bars Walking on beam side steps   PATIENT EDUCATION:  Education details: POC Person educated: Patient Education method: ECustomer service managerEducation comprehension: verbalized understanding  HOME EXERCISE PROGRAM: Access Code: [email or web address]   ASSESSMENT:  CLINICAL IMPRESSION: Patient reports that she was ill with the flu last week. She feels pretty well, but fatigued. She tolerated all exercises well, with rest breaks in between. Added heel raises to HEP.  OBJECTIVE IMPAIRMENTS: Abnormal gait, decreased activity tolerance, decreased balance, decreased coordination, decreased endurance, decreased mobility, difficulty walking,  decreased ROM, decreased strength, impaired flexibility, improper body mechanics, and pain.   ACTIVITY LIMITATIONS: bending, standing, squatting, stairs, and locomotion level  PARTICIPATION LIMITATIONS: school  PERSONAL FACTORS: 3+ comorbidities: Cancer survivor, CVA, concussion, DM  are also affecting patient's functional outcome.   REHAB POTENTIAL: Good  CLINICAL DECISION MAKING: Stable/uncomplicated  EVALUATION COMPLEXITY: Moderate   GOALS: Goals reviewed with patient? Yes  SHORT TERM GOALS: Target date: 12/03/2021  I with basic HEP Baseline: Goal status: met  LONG TERM GOALS: Target date: 01/28/2022   I with final HEP Baseline:  Goal status: ongoing  2.  Patient will score at least 20/30 on FGA to demonstrate significant improvement in balance and gait. Baseline: 13/30 Goal status: INITIAL  3.  Patient will return to her previous exercise plan with pain in L knee <3/10 Baseline:  Goal status: ongoing  4.  Patient will be able to walk at least 500' with normalized gait pattern, no antalgic symptoms on L. Baseline:  Goal status: INITIAL  5.  Increase BLE strength to at least 4/5 Baseline:  Goal status: INITIAL  PLAN:  PT FREQUENCY: 2x/week  PT DURATION: 10 weeks  PLANNED INTERVENTIONS: Therapeutic exercises, Therapeutic activity, Neuromuscular re-education, Balance training, Gait training, Patient/Family education, Self Care, Joint mobilization, Stair training, Dry Needling, and Manual therapy, [place] PLAN FOR NEXT SESSION: update HEP, L hip and LE stretching. **No traction, estim, VEllison Carwin MH/CP**   SMarcelina Morel DPT 12/17/2021, 10:18 AM

## 2021-12-20 ENCOUNTER — Encounter: Payer: Self-pay | Admitting: Physical Therapy

## 2021-12-20 ENCOUNTER — Ambulatory Visit: Payer: Medicaid Other | Admitting: Physical Therapy

## 2021-12-20 DIAGNOSIS — M25562 Pain in left knee: Secondary | ICD-10-CM

## 2021-12-20 DIAGNOSIS — R262 Difficulty in walking, not elsewhere classified: Secondary | ICD-10-CM

## 2021-12-20 DIAGNOSIS — R278 Other lack of coordination: Secondary | ICD-10-CM

## 2021-12-20 DIAGNOSIS — M6281 Muscle weakness (generalized): Secondary | ICD-10-CM | POA: Diagnosis not present

## 2021-12-20 DIAGNOSIS — R2681 Unsteadiness on feet: Secondary | ICD-10-CM

## 2021-12-20 DIAGNOSIS — R2689 Other abnormalities of gait and mobility: Secondary | ICD-10-CM

## 2021-12-20 DIAGNOSIS — R296 Repeated falls: Secondary | ICD-10-CM

## 2021-12-20 NOTE — Therapy (Signed)
OUTPATIENT PHYSICAL THERAPY LOWER EXTREMITY TREATMENT   Patient Name: Tracey Perkins MRN: [id number] DOB:1985/05/22, 36 y.o., female Today's Date: 12/20/2021   PT End of Session - 12/20/21 0929     Visit Number 7    Date for PT Re-Evaluation 01/28/22    PT Start Time 0928    PT Stop Time 1010    PT Time Calculation (min) 42 min    Activity Tolerance Patient tolerated treatment well    Behavior During Therapy [facility] for tasks assessed/performed                  Past Medical History:  Diagnosis Date   Asthma    Brain tumor ([place])    Diabetes mellitus without complication ([place])    Eczema    Food allergy    Gastroparesis    Hypokalemia    Hypomagnesemia    Hypothyroid    Neuropathy    Post concussion syndrome 05/2019   Thyroid disease    Vision changes    Past Surgical History:  Procedure Laterality Date   BRAIN SURGERY     Portacath placement and removed     WISDOM TOOTH EXTRACTION     Patient Active Problem List   Diagnosis Date Noted   Tachycardia 07/12/2021   Intractable nausea and vomiting 07/12/2021   Diarrhea 07/12/2021   Diabetic neuropathy ([place]) 07/12/2021   Hypothyroidism 07/12/2021   Mild cognitive impairment 09/04/2020   Diabetic gastroparesis ([place]) 09/04/2020   Acute CVA (cerebrovascular accident) ([place]) 09/03/2020   Impulsiveness 10/14/2019   Mild episode of recurrent major depressive disorder ([place]) 09/04/2019   Anxiety 08/16/2019   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   Status post radiation therapy 11/22/2018   Seasonal and perennial allergic rhinitis 11/22/2018   Seasonal allergic conjunctivitis 11/22/2018   Acute sinusitis 11/22/2018   Keratosis pilaris 10/05/2017   Melanocytic nevi of trunk 10/05/2017   Adverse food reaction 02/09/2017   Gastroesophageal reflux disease 02/09/2017   Mild intermittent asthma with acute exacerbation 11/19/2015   Mild persistent asthma without complication  40/81/4481   Acute seasonal allergic rhinitis due to pollen 11/16/2015   Anaphylactic shock due to adverse food reaction 11/16/2015   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   Type 1 diabetes ([place]) 11/16/2015   Dry mouth 11/16/2015   Xerosis cutis 11/16/2015   Nystagmus 08/15/2014   Visual field defect 08/15/2014   Disequilibrium 07/25/2014   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    PCP: Prince Solian  REFERRING PROVIDER: Gentry Fitz, MD   REFERRING DIAG: L distal medial ham strain and patella tendon strain   THERAPY DIAG:  Muscle weakness (generalized)  Acute pain of left knee  Other abnormalities of gait and mobility  Unsteadiness on feet  Difficulty in walking, not elsewhere classified  Repeated falls  Other lack of coordination  Rationale for Evaluation and Treatment Rehabilitation  ONSET DATE: 10/19/2021   SUBJECTIVE:   SUBJECTIVE STATEMENT: Patient reports that her knee is a bit sore today. She thinks it could be from performing her HEP yesterday.  PERTINENT HISTORY: CVA 10/22, childhood brain tumor with chemo, concussion 2 years ago, DM1 PAIN:  Are you having pain? Yes: NPRS scale: 0/10 Pain location: L knee/post knee Pain description: sharp, then takes a while to resolve Aggravating factors: maneuvering her leg Relieving factors: Heat, medication  PRECAUTIONS: None  WEIGHT BEARING RESTRICTIONS: No  FALLS:  Has patient fallen in last 6 months? 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  PLOF: Independent  PATIENT GOALS: She would like to be able to get dressed and perform her normal daily activities without pain and  more efficiently.   OBJECTIVE:   DIAGNOSTIC FINDINGS: x rays showed subluxed patella  COGNITION: Overall cognitive status: Within functional limits for tasks assessed     SENSATION: Not tested  EDEMA:  Patient denies swelling  MUSCLE LENGTH: Hamstrings: TBD Thomas test: TBD  POSTURE: rounded shoulders and increased lumbar lordosis  PALPATION: TTP on post/med HS, patellar tendon, ITB  LOWER EXTREMITY ROM:   Generally mildly stiff, but WFL   LOWER EXTREMITY MMT:   MMT Right eval Left eval  Hip flexion 4- 3+  Hip extension 3 3  Hip abduction 3 3  Hip adduction    Hip internal rotation    Hip external rotation    Knee flexion 4 3+  Knee extension 4- 4-  Ankle dorsiflexion 4 4  Ankle plantarflexion    Ankle inversion    Ankle eversion     (Blank rows = not tested)  LOWER EXTREMITY SPECIAL TESTS:  Knee special tests: Anterior drawer test: negative, Posterior drawer test: negative, Patellafemoral apprehension test: negative, and Patellafemoral grind test: positive   FUNCTIONAL TESTS:  5 times sit to stand: 12.46 Functional gait assessment: 13/30  GAIT: Distance walked: In clinic distances Assistive device utilized: None Level of assistance: Complete Independence Comments: Slow, unsteady, step to with antalgic pattern favoring LLE. She reports her RLE used to be her strongest, but since her most recent stroke, is no longer as strong.   TODAY'S TREATMENT                                                                                                                            DATE: 12/20/21 Bike L3 x 6 minutes STM to L ITB due to tightness and L lat knee pain Supine stretching for ITB and hip, unable to achieve figure 4 position, but able to pull knee to opposite shoulder. Lower trunk rotation. Seated lateral hip stretch, crossing one leg over the other and lean-difficulty achieving position. Standing ITB stretch facing wall, required BUE support. Seated knee  flexion 20#, 2 x 10 reps Seated knee ext 10#, 2 x 10 reps  12/17/21 Bike L3 x 6 minutes Reviewed HEP and practiced the balance exercises standing at counter. She has progressed past standing with narrow BOS and she does not have a pillow for the sit to stand exercise. Moved to parallel bars- stood on upside down BOSU with BUE support, attempting to release hands, able to go for up to 5 sec. Step ups onto 6" step, 2 x 5 reps each leg. Heel raises on a  step, 2 x 10 reps, B, with UE support  12/06/21 NuStep L5 x57mns  S2S on airex 2x10  Leg ext 10# 2x10 HS curl 25# 2x10  Calf stretch 30s  Calf raises 2x10  Step ups 4" 3 way leg kicks with 2# x10   12/01/21 Bike L3 x 6 minutes, then 2 x 30 sec at L% for strength Sit to stand from elevated mat, on Airex pad, with Red Tband around knees to engage hip abd. 1 x 10 reps Seated rotation, placing both hands on her R side. Walked hands out and back x 5. Repeat to L. Seated knee flexion, 25#, 2 x 10 reps Seated knee ext, 5#, 2 x 10 reps B side stepping onto and off Airex pad in parallel bars, minimize use of UE-Patient required occasional min A and had to grab the bars multiple times due to instability/LOB, but improved with practice.  11/30/21 NuStep L4 x 6 minutes. Step ups onto 4" step, forward, side step, crossover step, 10 reps each on LLE. Supine PROM to LLE, very slow movements with VC to relax as patient tends to tense up in anticipation of pain. HS, KTC, ER,  outer hip. Supine short kicks, 2 x 10 with 5# resistance. Bridge with Red Tband resistance at knees 10 reps Clamshells x 10  11/23/21 Bike L2 x653ms  STS 2x10 LAQ 3# 2x10 HS curls greenTB 2x10  Side steps on airex in // bars Walking on beam side steps   PATIENT EDUCATION:  Education details: POC Person educated: Patient Education method: ExCustomer service managerducation comprehension: verbalized understanding  HOME EXERCISE PROGRAM: Access Code:  [email or web address]   ASSESSMENT:  CLINICAL IMPRESSION: Patient noted some dizziness and unsteadiness when walking after bike. HR was elevated 110. She reports some anxiety and says this happens sometimes. Backed off a bit during treatment, focused on ITB STM and stretch due to reports of L lat knee pain and then therapist noted tight ITB. She was then able to perform some light strength training, avoiding more challenging multiple joint activities.  OBJECTIVE IMPAIRMENTS: Abnormal gait, decreased activity tolerance, decreased balance, decreased coordination, decreased endurance, decreased mobility, difficulty walking, decreased ROM, decreased strength, impaired flexibility, improper body mechanics, and pain.   ACTIVITY LIMITATIONS: bending, standing, squatting, stairs, and locomotion level  PARTICIPATION LIMITATIONS: school  PERSONAL FACTORS: 3+ comorbidities: Cancer survivor, CVA, concussion, DM  are also affecting patient's functional outcome.   REHAB POTENTIAL: Good  CLINICAL DECISION MAKING: Stable/uncomplicated  EVALUATION COMPLEXITY: Moderate   GOALS: Goals reviewed with patient? Yes  SHORT TERM GOALS: Target date: 12/03/2021  I with basic HEP Baseline: Goal status: met  LONG TERM GOALS: Target date: 01/28/2022   I with final HEP Baseline:  Goal status: ongoing  2.  Patient will score at least 20/30 on FGA to demonstrate significant improvement in balance and gait. Baseline: 13/30 Goal status: INITIAL  3.  Patient will return to her previous exercise plan with pain in L knee <3/10 Baseline:  Goal status: ongoing  4.  Patient will be able to walk at least 500' with normalized gait pattern, no antalgic symptoms on L. Baseline:  Goal status: ongoing  5.  Increase BLE strength to at least 4/5 Baseline:  Goal status: INITIAL  PLAN:  PT FREQUENCY: 2x/week  PT DURATION: 10 weeks  PLANNED INTERVENTIONS: Therapeutic exercises, Therapeutic activity, Neuromuscular  re-education, Balance training, Gait training, Patient/Family education, Self Care, Joint mobilization, Stair training, Dry Needling, and Manual therapy, [place] FOR NEXT SESSION:  update HEP, L hip and LE stretching. **No traction, estim, Ellison Carwin, MH/CP**   Marcelina Morel, DPT 12/20/2021, 10:10 AM

## 2021-12-22 ENCOUNTER — Other Ambulatory Visit: Payer: Self-pay

## 2021-12-22 ENCOUNTER — Encounter (HOSPITAL_BASED_OUTPATIENT_CLINIC_OR_DEPARTMENT_OTHER): Payer: Self-pay

## 2021-12-22 ENCOUNTER — Emergency Department (HOSPITAL_BASED_OUTPATIENT_CLINIC_OR_DEPARTMENT_OTHER): Payer: Medicaid Other

## 2021-12-22 ENCOUNTER — Observation Stay (HOSPITAL_BASED_OUTPATIENT_CLINIC_OR_DEPARTMENT_OTHER)
Admission: EM | Admit: 2021-12-22 | Discharge: 2021-12-24 | Disposition: A | Discharge status: Home / Self Care | Payer: Medicaid Other | Attending: Internal Medicine | Admitting: Internal Medicine

## 2021-12-22 ENCOUNTER — Encounter (HOSPITAL_COMMUNITY): Payer: Self-pay

## 2021-12-22 DIAGNOSIS — Z7982 Long term (current) use of aspirin: Secondary | ICD-10-CM | POA: Insufficient documentation

## 2021-12-22 DIAGNOSIS — R112 Nausea with vomiting, unspecified: Secondary | ICD-10-CM | POA: Diagnosis present

## 2021-12-22 DIAGNOSIS — J4530 Mild persistent asthma, uncomplicated: Secondary | ICD-10-CM | POA: Diagnosis present

## 2021-12-22 DIAGNOSIS — R197 Diarrhea, unspecified: Secondary | ICD-10-CM | POA: Diagnosis not present

## 2021-12-22 DIAGNOSIS — Z79899 Other long term (current) drug therapy: Secondary | ICD-10-CM | POA: Insufficient documentation

## 2021-12-22 DIAGNOSIS — E039 Hypothyroidism, unspecified: Secondary | ICD-10-CM | POA: Diagnosis not present

## 2021-12-22 DIAGNOSIS — F419 Anxiety disorder, unspecified: Secondary | ICD-10-CM | POA: Diagnosis present

## 2021-12-22 DIAGNOSIS — Z1152 Encounter for screening for COVID-19: Secondary | ICD-10-CM | POA: Diagnosis not present

## 2021-12-22 DIAGNOSIS — K529 Noninfective gastroenteritis and colitis, unspecified: Secondary | ICD-10-CM | POA: Diagnosis not present

## 2021-12-22 DIAGNOSIS — J45909 Unspecified asthma, uncomplicated: Secondary | ICD-10-CM | POA: Insufficient documentation

## 2021-12-22 DIAGNOSIS — E119 Type 2 diabetes mellitus without complications: Secondary | ICD-10-CM | POA: Diagnosis not present

## 2021-12-22 DIAGNOSIS — E860 Dehydration: Secondary | ICD-10-CM

## 2021-12-22 DIAGNOSIS — Z8673 Personal history of transient ischemic attack (TIA), and cerebral infarction without residual deficits: Secondary | ICD-10-CM | POA: Insufficient documentation

## 2021-12-22 DIAGNOSIS — E109 Type 1 diabetes mellitus without complications: Secondary | ICD-10-CM | POA: Diagnosis present

## 2021-12-22 LAB — C DIFFICILE QUICK SCREEN W PCR REFLEX
C Diff antigen: NEGATIVE
C Diff interpretation: NOT DETECTED
C Diff toxin: NEGATIVE

## 2021-12-22 LAB — URINALYSIS, MICROSCOPIC (REFLEX)

## 2021-12-22 LAB — URINALYSIS, ROUTINE W REFLEX MICROSCOPIC
Bilirubin Urine: NEGATIVE
Glucose, UA: NEGATIVE mg/dL
Ketones, ur: NEGATIVE mg/dL
Leukocytes,Ua: NEGATIVE
Nitrite: NEGATIVE
Protein, ur: NEGATIVE mg/dL
Specific Gravity, Urine: 1.01 (ref 1.005–1.030)
pH: 5.5 (ref 5.0–8.0)

## 2021-12-22 LAB — COMPREHENSIVE METABOLIC PANEL
ALT: 37 U/L (ref 0–44)
AST: 22 U/L (ref 15–41)
Albumin: 4.2 g/dL (ref 3.5–5.0)
Alkaline Phosphatase: 109 U/L (ref 38–126)
Anion gap: 8 (ref 5–15)
BUN: 14 mg/dL (ref 6–20)
CO2: 29 mmol/L (ref 22–32)
Calcium: 9.2 mg/dL (ref 8.9–10.3)
Chloride: 101 mmol/L (ref 98–111)
Creatinine, Ser: 1.04 mg/dL — ABNORMAL HIGH (ref 0.44–1.00)
GFR, Estimated: >60 mL/min (ref >60)
Glucose, Bld: 86 mg/dL (ref 70–99)
Potassium: 3.8 mmol/L (ref 3.5–5.1)
Sodium: 138 mmol/L (ref 135–145)
Total Bilirubin: 0.5 mg/dL (ref 0.3–1.2)
Total Protein: 8.1 g/dL (ref 6.5–8.1)

## 2021-12-22 LAB — CBC
HCT: 40.2 % (ref 36.0–46.0)
Hemoglobin: 13.4 g/dL (ref 12.0–15.0)
MCH: 29.7 pg (ref 26.0–34.0)
MCHC: 33.3 g/dL (ref 30.0–36.0)
MCV: 89.1 fL (ref 80.0–100.0)
Platelets: 482 10*3/uL — ABNORMAL HIGH (ref 150–400)
RBC: 4.51 MIL/uL (ref 3.87–5.11)
RDW: 13.7 % (ref 11.5–15.5)
WBC: 14.7 10*3/uL — ABNORMAL HIGH (ref 4.0–10.5)
nRBC: 0 % (ref 0.0–0.2)

## 2021-12-22 LAB — CBG MONITORING, ED
Glucose-Capillary: 301 mg/dL — ABNORMAL HIGH (ref 70–99)
Glucose-Capillary: 74 mg/dL (ref 70–99)
Glucose-Capillary: 82 mg/dL (ref 70–99)

## 2021-12-22 LAB — LIPASE, BLOOD: Lipase: 35 U/L (ref 11–51)

## 2021-12-22 LAB — RESP PANEL BY RT-PCR (FLU A&B, COVID) ARPGX2
Influenza A by PCR: NEGATIVE
Influenza B by PCR: NEGATIVE
SARS Coronavirus 2 by RT PCR: NEGATIVE

## 2021-12-22 LAB — GLUCOSE, CAPILLARY
Glucose-Capillary: 321 mg/dL — ABNORMAL HIGH (ref 70–99)
Glucose-Capillary: 285 mg/dL — ABNORMAL HIGH (ref 70–99)

## 2021-12-22 LAB — HCG, SERUM, QUALITATIVE: Preg, Serum: NEGATIVE

## 2021-12-22 MED ORDER — LORATADINE 10 MG PO TABS
10.0000 mg | ORAL_TABLET | Freq: Every morning | ORAL | Status: DC
  Administered 2021-12-23 – 2021-12-24 (×2): 10 mg via ORAL
  Filled 2021-12-22 (×2): qty 1

## 2021-12-22 MED ORDER — SODIUM CHLORIDE 0.9 % IV BOLUS
1000.0000 mL | Freq: Once | INTRAVENOUS | Status: AC
  Administered 2021-12-22: 1000 mL via INTRAVENOUS

## 2021-12-22 MED ORDER — ONDANSETRON HCL 4 MG/2ML IJ SOLN
4.0000 mg | Freq: Once | INTRAMUSCULAR | Status: AC
  Administered 2021-12-22: 4 mg via INTRAVENOUS
  Filled 2021-12-22: qty 2

## 2021-12-22 MED ORDER — PRAVASTATIN SODIUM 40 MG PO TABS
20.0000 mg | ORAL_TABLET | Freq: Every day | ORAL | Status: DC
  Administered 2021-12-22 – 2021-12-23 (×2): 20 mg via ORAL
  Filled 2021-12-22 (×2): qty 1

## 2021-12-22 MED ORDER — MONTELUKAST SODIUM 10 MG PO TABS
10.0000 mg | ORAL_TABLET | Freq: Every day | ORAL | Status: DC
  Administered 2021-12-22 – 2021-12-23 (×2): 10 mg via ORAL
  Filled 2021-12-22 (×2): qty 1

## 2021-12-22 MED ORDER — IOHEXOL 300 MG/ML  SOLN
100.0000 mL | Freq: Once | INTRAMUSCULAR | Status: AC | PRN
  Administered 2021-12-22: 100 mL via INTRAVENOUS

## 2021-12-22 MED ORDER — INSULIN PUMP
Freq: Three times a day (TID) | SUBCUTANEOUS | Status: DC
  Administered 2021-12-22: 0.5 via SUBCUTANEOUS
  Administered 2021-12-23: 3 via SUBCUTANEOUS
  Administered 2021-12-23: 4 via SUBCUTANEOUS
  Filled 2021-12-22: qty 1

## 2021-12-22 MED ORDER — PIPERACILLIN-TAZOBACTAM 3.375 G IVPB 30 MIN
3.3750 g | Freq: Once | INTRAVENOUS | Status: AC
  Administered 2021-12-22: 3.375 g via INTRAVENOUS
  Filled 2021-12-22: qty 50

## 2021-12-22 MED ORDER — ONDANSETRON HCL 4 MG PO TABS: 4.0000 mg | ORAL_TABLET | Freq: Four times a day (QID) | ORAL | Status: DC | PRN

## 2021-12-22 MED ORDER — GABAPENTIN 300 MG PO CAPS
600.0000 mg | ORAL_CAPSULE | Freq: Every day | ORAL | Status: DC
  Administered 2021-12-22 – 2021-12-23 (×2): 600 mg via ORAL
  Filled 2021-12-22 (×2): qty 2

## 2021-12-22 MED ORDER — ASPIRIN 81 MG PO TBEC
81.0000 mg | DELAYED_RELEASE_TABLET | Freq: Every day | ORAL | Status: DC
  Administered 2021-12-22 – 2021-12-23 (×2): 81 mg via ORAL
  Filled 2021-12-22 (×2): qty 1

## 2021-12-22 MED ORDER — ORAL CARE MOUTH RINSE: 15.0000 mL | OROMUCOSAL | Status: DC | PRN

## 2021-12-22 MED ORDER — SACCHAROMYCES BOULARDII 250 MG PO CAPS
250.0000 mg | ORAL_CAPSULE | Freq: Every morning | ORAL | Status: DC
  Administered 2021-12-23 – 2021-12-24 (×2): 250 mg via ORAL
  Filled 2021-12-22 (×2): qty 1

## 2021-12-22 MED ORDER — ENOXAPARIN SODIUM 40 MG/0.4ML IJ SOSY
40.0000 mg | PREFILLED_SYRINGE | INTRAMUSCULAR | Status: DC
  Administered 2021-12-22 – 2021-12-23 (×2): 40 mg via SUBCUTANEOUS
  Filled 2021-12-22 (×2): qty 0.4

## 2021-12-22 MED ORDER — POLYVINYL ALCOHOL 1.4 % OP SOLN: 1.0000 [drp] | OPHTHALMIC | Status: DC | PRN

## 2021-12-22 MED ORDER — ACETAMINOPHEN 650 MG RE SUPP: 650.0000 mg | Freq: Four times a day (QID) | RECTAL | Status: DC | PRN

## 2021-12-22 MED ORDER — POLYETHYL GLYCOL-PROPYL GLYCOL 0.4-0.3 % OP GEL: Freq: Three times a day (TID) | OPHTHALMIC | Status: DC | PRN

## 2021-12-22 MED ORDER — ONDANSETRON HCL 4 MG/2ML IJ SOLN: 4.0000 mg | Freq: Four times a day (QID) | INTRAMUSCULAR | Status: DC | PRN

## 2021-12-22 MED ORDER — LEVOTHYROXINE SODIUM 75 MCG PO TABS
75.0000 ug | ORAL_TABLET | Freq: Every day | ORAL | Status: DC
  Administered 2021-12-23 – 2021-12-24 (×2): 75 ug via ORAL
  Filled 2021-12-22 (×2): qty 1

## 2021-12-22 MED ORDER — HYDRALAZINE HCL 20 MG/ML IJ SOLN: 5.0000 mg | INTRAMUSCULAR | Status: DC | PRN

## 2021-12-22 MED ORDER — PIPERACILLIN-TAZOBACTAM 3.375 G IVPB
3.3750 g | Freq: Three times a day (TID) | INTRAVENOUS | Status: DC
  Administered 2021-12-22 – 2021-12-23 (×3): 3.375 g via INTRAVENOUS
  Filled 2021-12-22 (×3): qty 50

## 2021-12-22 MED ORDER — LACTATED RINGERS IV SOLN: INTRAVENOUS | Status: DC

## 2021-12-22 MED ORDER — ACETAMINOPHEN 325 MG PO TABS: 650.0000 mg | ORAL_TABLET | Freq: Four times a day (QID) | ORAL | Status: DC | PRN

## 2021-12-22 MED ORDER — FLUTICASONE PROPIONATE 50 MCG/ACT NA SUSP: 2.0000 | Freq: Every day | NASAL | Status: DC | PRN

## 2021-12-22 MED ORDER — GABAPENTIN 300 MG PO CAPS: 300.0000 mg | ORAL_CAPSULE | ORAL | Status: DC

## 2021-12-22 MED ORDER — CITALOPRAM HYDROBROMIDE 10 MG PO TABS
5.0000 mg | ORAL_TABLET | Freq: Every day | ORAL | Status: DC
  Administered 2021-12-23: 5 mg via ORAL
  Filled 2021-12-22 (×3): qty 1

## 2021-12-22 MED ORDER — GABAPENTIN 300 MG PO CAPS
300.0000 mg | ORAL_CAPSULE | Freq: Every day | ORAL | Status: DC
  Administered 2021-12-22 – 2021-12-23 (×2): 300 mg via ORAL
  Filled 2021-12-22 (×2): qty 1

## 2021-12-22 NOTE — Progress Notes (Signed)
Pharmacy Antibiotic Note  Tracey Perkins is a 36 y.o. female admitted on 12/22/2021 with intra-abdominal infection.  Pharmacy has been consulted for Zosyn dosing.  Plan: Zosyn 3.375g IV q8h (4 hour infusion). Monitor clinical status, renal function and culture results daily.    Height: '5\' 3"'$  (160 cm) Weight: 88.5 kg (195 lb) IBW/kg (Calculated) : 52.4  Temp (24hrs), Avg:97.9 F (36.6 C), Min:97.6 F (36.4 C), Max:98 F (36.7 C)  Recent Labs  Lab 12/22/21 0117  WBC 14.7*  CREATININE 1.04*    Estimated Creatinine Clearance: 79.6 mL/min (A) (by C-G formula based on SCr of 1.04 mg/dL (H)).    Allergies  Allergen Reactions   Banana Swelling and Other (See Comments)    Mouth burning and swollen   Bactrim [Sulfamethoxazole-Trimethoprim] Hives   Doxycycline Hives   Gentian Violet Other (See Comments)    Mouth burns   Gentian Violet-Proflavine Sulfate [Triple Dye] Other (See Comments)    Mouth burns   Mold Extract [Trichophyton] Other (See Comments)    Hay Fever   Morphine And Related Hives   Other Hives, Itching and Other (See Comments)    "Hay fever, dust and pollen."  Per patient.- itching, runny nose   Vancomycin Other (See Comments)    Red man syndrome     Antimicrobials this admission: Zosyn 12/6>>  12/6  Cdiff pcr : neg 12/6 covid pcr neg, flu neg  Dose adjustments this admission:   Microbiology results: 12/6  Cdiff pcr : neg 12/6 covid pcr neg, flu neg   Thank you for allowing pharmacy to be a part of this patient's care.  Nicole Cella, RPh Clinical Pharmacist [phone] 12/22/2021 12:06 PM Please check AMION for all [place] phone numbers After 10:00 PM, call [place] [phone]

## 2021-12-22 NOTE — ED Notes (Signed)
Pt unable to void at this time for urine specimen.

## 2021-12-22 NOTE — ED Notes (Signed)
Pt resting comfortably in bed with eyes close. RR even and unlabored. No acute distress noted. Bed lock and low and call bell within reach. Family member at beside update on pt's status at this time.

## 2021-12-22 NOTE — ED Notes (Signed)
Blood glucose level is 301 mg/dl. RN notified.

## 2021-12-22 NOTE — ED Triage Notes (Signed)
Pt c/o vomiting and diarrhea that started today. Pt reports diarrhea started at 6 am then vomiting started this afternoon. Pt reports she is a diabetic and her blood sugar keeps dropping. Pt reports she was sick with the same the Sunday after Thanksgiving.

## 2021-12-22 NOTE — ED Provider Notes (Signed)
[facility] EMERGENCY DEPARTMENT Provider Note   CSN: [id number] Arrival date & time: 12/22/21  0038     History  Chief Complaint  Patient presents with   Emesis   Diarrhea    Tracey Perkins is a 36 y.o. female.  The history is provided by the patient.  Emesis Severity:  Severe Duration:  1 day Timing:  Constant Quality:  Stomach contents Progression:  Unchanged Chronicity:  Recurrent Context: not post-tussive   Relieved by:  Nothing Worsened by:  Nothing Associated symptoms: diarrhea   Associated symptoms: no fever   Risk factors: no prior abdominal surgery   Risk factors comment:  Had vomiting and diarrhea the week of Thanksgiving that she was seen in the ED for, this resolved and then symptoms returned. Diarrhea Quality:  Watery Severity:  Severe Timing:  Constant Progression:  Unchanged Relieved by:  Nothing Worsened by:  Nothing Ineffective treatments:  None tried Associated symptoms: vomiting   Associated symptoms: no fever   Risk factors: no recent antibiotic use   Patient with type 1 diabetes with her second episode of nausea vomiting and diarrhea in 2 weeks.       Home Medications Prior to Admission medications   Medication Sig Start Date End Date Taking? Authorizing Provider  acetaminophen (TYLENOL) 500 MG tablet Take 500 mg by mouth every 6 (six) hours as needed for mild pain or headache.    [provider]  aspirin EC 81 MG tablet Take 81 mg by mouth daily. Swallow whole.    [provider]  Azelastine HCl 137 MCG/SPRAY SOLN PLACE 2 SPRAYS INTO BOTH NOSTRILS 2 (TWO) TIMES DAILY Patient taking differently: Place 2 sprays into both nostrils 2 (two) times daily. 06/09/21   Dara Hoyer, FNP  citalopram (CELEXA) 10 MG tablet Take 0.5 tablets (5 mg total) by mouth at bedtime. 09/02/21   Salley Slaughter, NP  Continuous Blood Gluc Sensor (DEXCOM G6 SENSOR) MISC Apply 1 Device topically See admin instructions. Place 1 sensor  into the skin every 10 days 06/03/21   [provider]  Continuous Blood Gluc Transmit (DEXCOM G6 TRANSMITTER) MISC USE 1 EACH EVERY 3 (THREE) MONTHS 01/26/21   [provider]  DERMOTIC 0.01 % OIL Place 4 drops into both ears See admin instructions. Place 4 drops into both ears 2 times a week as needed/as directed 05/15/20   [provider]  EPINEPHrine 0.3 mg/0.3 mL IJ SOAJ injection Use as directed for severe allergic reaction. 01/29/21   Ambs, Kathrine Cords, FNP  FLORASTOR 250 MG capsule Take 250 mg by mouth in the morning.    [provider]  fluconazole (DIFLUCAN) 150 MG tablet Take 150 mg by mouth as needed (as directed, for yeast infections). 05/21/21   [provider]  fluticasone (FLONASE) 50 MCG/ACT nasal spray 2 sprays per nostril daily as needed for stuffy nose. Patient taking differently: Place 2 sprays into both nostrils daily. 06/09/21   Dara Hoyer, FNP  fluticasone (FLOVENT HFA) 110 MCG/ACT inhaler Inhale 2 puffs into the lungs 2 (two) times daily as needed (for asthma flares, until cough and wheeze-free). 07/14/21   Aline August, MD  gabapentin (NEURONTIN) 300 MG capsule Take 300-600 mg by mouth See admin instructions. Take 300 mg by mouth in the morning and 600 mg at bedtime 11/13/19 06/03/22  [provider]  Glucagon 3 MG/DOSE POWD Place 3 mg into the nose as needed (low blood sugar).    [provider]  insulin aspart (NOVOLOG) 100 UNIT/ML injection Inject into the skin See admin instructions. Per pump 12/24/19   [provider]  Insulin Disposable Pump (OMNIPOD 5 G6 POD, GEN 5,) MISC Inject 1 Device into the skin See admin instructions. Place 1 pod into the skin every 2-3 days 06/04/21   [provider]  levothyroxine (SYNTHROID) 88 MCG tablet Take 88 mcg by mouth daily before breakfast. 04/05/21   [provider]  loratadine (CLARITIN) 10 MG tablet Take 1 tablet (10 mg total) by mouth daily as needed for  allergies. Patient taking differently: Take 10 mg by mouth in the morning. 06/09/21   Ambs, Kathrine Cords, FNP  lubiprostone (AMITIZA) 8 MCG capsule Take 16 mcg by mouth daily with breakfast.     [provider]  magnesium oxide (MAG-OX) 400 (240 Mg) MG tablet Take 800 mg by mouth at bedtime.    [provider]  metoCLOPramide (REGLAN) 5 MG tablet Take 1 tablet (5 mg total) by mouth 3 (three) times daily before meals for 10 days. 07/14/21 07/24/21  Aline August, MD  montelukast (SINGULAIR) 10 MG tablet TAKE 1 TABLET (10 MG TOTAL) BY MOUTH AT BEDTIME. TO PREVENT COUGHING OR WHEEZING 08/24/21   Ambs, Kathrine Cords, FNP  Omega-3 Fatty Acids (FISH OIL PO) Take 1 capsule by mouth daily with supper.    [provider]  ondansetron (ZOFRAN) 4 MG tablet Take 1 tablet (4 mg total) by mouth every 8 (eight) hours as needed for nausea or vomiting. 07/14/21   Aline August, MD  pantoprazole (PROTONIX) 20 MG tablet Take 1 tablet (20 mg total) by mouth daily. Patient taking differently: Take 20 mg by mouth daily as needed for heartburn or indigestion. 06/09/21 06/09/22  Dara Hoyer, FNP  Polyethyl Glycol-Propyl Glycol (SYSTANE OP) Place 1 drop into both eyes 3 (three) times daily as needed (for dryness).    [provider]  potassium chloride (KLOR-CON) 10 MEQ tablet Take 10 mEq by mouth daily. 03/23/21   [provider]  pravastatin (PRAVACHOL) 20 MG tablet Take 20 mg by mouth daily after supper.    [provider]  PROAIR HFA 108 [phone] Base) MCG/ACT inhaler Inhale 2 puffs into the lungs every 4 (four) hours as needed for wheezing or shortness of breath. TAKE 2 PUFFS BY MOUTH EVERY 4 HOURS AS NEEDED Strength: 108 (90 Base) MCG/ACT Patient taking differently: Inhale 2 puffs into the lungs every 4 (four) hours as needed for wheezing or shortness of breath. 06/09/21   Ambs, Kathrine Cords, FNP  vitamin B-12 (CYANOCOBALAMIN) 1000 MCG tablet Take 1,000 mcg by mouth every 7 (seven) days.     [provider]      Allergies    Banana, Bactrim [sulfamethoxazole-trimethoprim], Doxycycline, Gentian violet, Gentian violet-proflavine sulfate [triple dye], Mold extract [trichophyton], Morphine and related, Other, and Vancomycin    Review of Systems   Review of Systems  Constitutional:  Negative for fever.  HENT:  Negative for facial swelling.   Respiratory:  Negative for wheezing and stridor.   Gastrointestinal:  Positive for diarrhea and vomiting.  All other systems reviewed and are negative.   Physical Exam Updated Vital Signs BP 94/74   Pulse (!) 101   Temp 98 F (36.7 C) (Oral)   Resp 11   Ht '5\' 3"'$  (1.6 m)   Wt 88.5 kg   SpO2 90%   BMI 34.54 kg/m  Physical Exam Vitals and nursing note reviewed.  Constitutional:  General: She is not in acute distress.    Appearance: She is well-developed.  HENT:     Head: Normocephalic and atraumatic.     Nose: Nose normal.  Eyes:     Pupils: Pupils are equal, round, and reactive to light.  Cardiovascular:     Rate and Rhythm: Normal rate and regular rhythm.     Pulses: Normal pulses.     Heart sounds: Normal heart sounds.  Pulmonary:     Effort: Pulmonary effort is normal. No respiratory distress.     Breath sounds: Normal breath sounds.  Abdominal:     General: Bowel sounds are normal. There is no distension.     Palpations: Abdomen is soft.     Tenderness: There is no abdominal tenderness. There is no guarding or rebound.  Genitourinary:    Vagina: No vaginal discharge.  Musculoskeletal:        General: Normal range of motion.     Cervical back: Neck supple.  Skin:    General: Skin is dry.     Capillary Refill: Capillary refill takes less than 2 seconds.     Findings: No erythema or rash.  Neurological:     General: No focal deficit present.     Mental Status: She is alert.     Deep Tendon Reflexes: Reflexes normal.  Psychiatric:        Mood and Affect: Mood normal.     ED Results / Procedures  / Treatments   Labs (all labs ordered are listed, but only abnormal results are displayed) Results for orders placed or performed during the hospital encounter of 12/22/21  Resp Panel by RT-PCR (Flu A&B, Covid) Anterior Nasal Swab   Specimen: Anterior Nasal Swab  Result Value Ref Range   SARS Coronavirus 2 by RT PCR NEGATIVE NEGATIVE   Influenza A by PCR NEGATIVE NEGATIVE   Influenza B by PCR NEGATIVE NEGATIVE  Lipase, blood  Result Value Ref Range   Lipase 35 11 - 51 U/L  Comprehensive metabolic panel  Result Value Ref Range   Sodium 138 135 - 145 mmol/L   Potassium 3.8 3.5 - 5.1 mmol/L   Chloride 101 98 - 111 mmol/L   CO2 29 22 - 32 mmol/L   Glucose, Bld 86 70 - 99 mg/dL   BUN 14 6 - 20 mg/dL   Creatinine, Ser 1.04 (H) 0.44 - 1.00 mg/dL   Calcium 9.2 8.9 - 10.3 mg/dL   Total Protein 8.1 6.5 - 8.1 g/dL   Albumin 4.2 3.5 - 5.0 g/dL   AST 22 15 - 41 U/L   ALT 37 0 - 44 U/L   Alkaline Phosphatase 109 38 - 126 U/L   Total Bilirubin 0.5 0.3 - 1.2 mg/dL   GFR, Estimated >60 >60 mL/min   Anion gap 8 5 - 15  CBC  Result Value Ref Range   WBC 14.7 (H) 4.0 - 10.5 K/uL   RBC 4.51 3.87 - 5.11 MIL/uL   Hemoglobin 13.4 12.0 - 15.0 g/dL   HCT 40.2 36.0 - 46.0 %   MCV 89.1 80.0 - 100.0 fL   MCH 29.7 26.0 - 34.0 pg   MCHC 33.3 30.0 - 36.0 g/dL   RDW 13.7 11.5 - 15.5 %   Platelets 482 (H) 150 - 400 K/uL   nRBC 0.0 0.0 - 0.2 %  Urinalysis, Routine w reflex microscopic Urine, Clean Catch  Result Value Ref Range   Color, Urine YELLOW YELLOW   APPearance CLEAR CLEAR  Specific Gravity, Urine 1.010 1.005 - 1.030   pH 5.5 5.0 - 8.0   Glucose, UA NEGATIVE NEGATIVE mg/dL   Hgb urine dipstick LARGE (A) NEGATIVE   Bilirubin Urine NEGATIVE NEGATIVE   Ketones, ur NEGATIVE NEGATIVE mg/dL   Protein, ur NEGATIVE NEGATIVE mg/dL   Nitrite NEGATIVE NEGATIVE   Leukocytes,Ua NEGATIVE NEGATIVE  hCG, serum, qualitative  Result Value Ref Range   Preg, Serum NEGATIVE NEGATIVE  Urinalysis,  Microscopic (reflex)  Result Value Ref Range   RBC / HPF 11-20 0 - 5 RBC/hpf   WBC, UA 0-5 0 - 5 WBC/hpf   Bacteria, UA RARE (A) NONE SEEN   Squamous Epithelial / LPF 0-5 0 - 5   Hyaline Casts, UA PRESENT   CBG monitoring, ED  Result Value Ref Range   Glucose-Capillary 82 70 - 99 mg/dL  CBG monitoring, ED  Result Value Ref Range   Glucose-Capillary 74 70 - 99 mg/dL   CT ABDOMEN PELVIS W CONTRAST  Result Date: 12/22/2021 CLINICAL DATA:  Abdominal pain, vomiting, and diarrhea. History of diabetes and gastroparesis. EXAM: CT ABDOMEN AND PELVIS WITH CONTRAST TECHNIQUE: Multidetector CT imaging of the abdomen and pelvis was performed using the standard protocol following bolus administration of intravenous contrast. RADIATION DOSE REDUCTION: This exam was performed according to the departmental dose-optimization program which includes automated exposure control, adjustment of the mA and/or kV according to patient size and/or use of iterative reconstruction technique. CONTRAST:  172m OMNIPAQUE IOHEXOL 300 MG/ML  SOLN COMPARISON:  07/12/2021. FINDINGS: Lower chest: No acute abnormality. Hepatobiliary: Focal fatty infiltration is noted in the left lobe of the liver adjacent to the falciform ligament. No biliary ductal dilatation. The gallbladder is without stones. Pancreas: Unremarkable. No pancreatic ductal dilatation or surrounding inflammatory changes. Spleen: Normal in size without focal abnormality. Adrenals/Urinary Tract: The adrenal glands are within normal limits. The kidneys enhance symmetrically. No renal calculus or hydronephrosis. The bladder is unremarkable. Stomach/Bowel: Stomach is within normal limits. Appendix appears normal. No bowel obstruction, free air, or pneumatosis. There is colonic wall thickening involving the ascending colon and proximal transverse colon with mild surrounding inflammatory changes. Vascular/Lymphatic: No significant vascular findings are present. No enlarged  abdominal or pelvic lymph nodes. Reproductive: The uterus is within normal limits.  No adnexal mass. Other: No abdominopelvic ascites. Musculoskeletal: No acute osseous abnormality. IMPRESSION: Colonic wall thickening involving the ascending and proximal transverse colon, suggesting colitis. Electronically Signed   By: LBrett FairyM.D.   On: 12/22/2021 04:37   DG Chest Portable 1 View  Result Date: 12/12/2021 CLINICAL DATA:  Fever and tachycardia per order. EXAM: PORTABLE CHEST - 1 VIEW COMPARISON:  07/12/2021 FINDINGS: Lungs are clear. Heart size and mediastinal contours are within normal limits. No effusion. Visualized bones unremarkable. IMPRESSION: No acute cardiopulmonary disease. Electronically Signed   By: DLucrezia EuropeM.D.   On: 12/12/2021 19:48     EKG None  Radiology CT ABDOMEN PELVIS W CONTRAST  Result Date: 12/22/2021 CLINICAL DATA:  Abdominal pain, vomiting, and diarrhea. History of diabetes and gastroparesis. EXAM: CT ABDOMEN AND PELVIS WITH CONTRAST TECHNIQUE: Multidetector CT imaging of the abdomen and pelvis was performed using the standard protocol following bolus administration of intravenous contrast. RADIATION DOSE REDUCTION: This exam was performed according to the departmental dose-optimization program which includes automated exposure control, adjustment of the mA and/or kV according to patient size and/or use of iterative reconstruction technique. CONTRAST:  10109mOMNIPAQUE IOHEXOL 300 MG/ML  SOLN COMPARISON:  07/12/2021. FINDINGS: Lower chest:  No acute abnormality. Hepatobiliary: Focal fatty infiltration is noted in the left lobe of the liver adjacent to the falciform ligament. No biliary ductal dilatation. The gallbladder is without stones. Pancreas: Unremarkable. No pancreatic ductal dilatation or surrounding inflammatory changes. Spleen: Normal in size without focal abnormality. Adrenals/Urinary Tract: The adrenal glands are within normal limits. The kidneys enhance  symmetrically. No renal calculus or hydronephrosis. The bladder is unremarkable. Stomach/Bowel: Stomach is within normal limits. Appendix appears normal. No bowel obstruction, free air, or pneumatosis. There is colonic wall thickening involving the ascending colon and proximal transverse colon with mild surrounding inflammatory changes. Vascular/Lymphatic: No significant vascular findings are present. No enlarged abdominal or pelvic lymph nodes. Reproductive: The uterus is within normal limits.  No adnexal mass. Other: No abdominopelvic ascites. Musculoskeletal: No acute osseous abnormality. IMPRESSION: Colonic wall thickening involving the ascending and proximal transverse colon, suggesting colitis. Electronically Signed   By: Brett Fairy M.D.   On: 12/22/2021 04:37    Procedures Procedures    Medications Ordered in ED Medications  piperacillin-tazobactam (ZOSYN) IVPB 3.375 g (has no administration in time range)  ondansetron (ZOFRAN) injection 4 mg (4 mg Intravenous Given 12/22/21 0224)  sodium chloride 0.9 % bolus 1,000 mL (0 mLs Intravenous Stopped 12/22/21 0340)  iohexol (OMNIPAQUE) 300 MG/ML solution 100 mL (100 mLs Intravenous Contrast Given 12/22/21 0416)  sodium chloride 0.9 % bolus 1,000 mL (1,000 mLs Intravenous New Bag/Given 12/22/21 0511)    ED Course/ Medical Decision Making/ A&P                           Medical Decision Making Nausea vomiting and diarrhea and labile sugars   Amount and/or Complexity of Data Reviewed Independent Historian: parent    Details: See above  External Data Reviewed: labs and notes.    Details: Previous notes and labs reviewed  Labs: ordered.    Details: All labs reviewed: negative covid and flu.  Normal lipase 35.  Normal sodium normal potassium 3.8, normal creatinine 1.04, normal lfts.  Urine is negative for uti.  Negative pregnancy test.   Radiology: ordered and independent interpretation performed.    Details: Colitis on CT Discussion of  management or test interpretation with external provider(s): Case d/w Dr. Velia Meyer who will admit.    Risk Prescription drug management. Decision regarding hospitalization. Risk Details: I do not think this patient is septic.  Patient is dehydrated.  Sugars are normal with normal anion gap.  CT is consistent with colitis.  Will admit for antibiotics and hydration.     The patient appears reasonably stabilized for admission considering the current resources, flow, and capabilities available in the ED at this time, and I doubt any other [facility] requiring further screening and/or treatment in the ED prior to admission.  Final Clinical Impression(s) / ED Diagnoses Final diagnoses:  Colitis  Nausea and vomiting, unspecified vomiting type    Rx / DC Orders ED Discharge Orders     None         Derek Laughter, MD 12/22/21 7001

## 2021-12-22 NOTE — Inpatient Diabetes Management (Cosign Needed)
Inpatient Diabetes Program Recommendations  AACE/ADA: New Consensus Statement on Inpatient Glycemic Control (2015)  Target Ranges:  Prepandial:   less than 140 mg/dL      Peak postprandial:   less than 180 mg/dL (1-2 hours)      Critically ill patients:  140 - 180 mg/dL   Lab Results  Component Value Date   GLUCAP 321 (H) 12/22/2021   HGBA1C 8.8 (H) 07/13/2021    Review of Glycemic Control  Diabetes history: DM1 Outpatient Diabetes medications:  T-Slim Insulin pump with Dexcom/Novolog Basal 31.2 units/24H  Carb Ratio: 1 unit:8 carbs  Insulin correction factor: 1 unit: 30 mg/dL  Target 12A-9P--110 9P-12A--130  Sick Day pump settings Basal 17.75 units/24H  Correction Factor 1 units: 50 mg/dL  Insulin Carb Ratio 12A-11A--1 unit:12 carbs 11A-12A--1 units:8 carbs  Current orders for Inpatient glycemic control: Insulin pump  Spoke with mother at bedside.  Reviewed insulin pump settings and listed as above.  Patient has been sick with N/V/D.  The family has also been sick with the same over the last several days.  She was diagnosed with T1DM at age 40.    Current Dexcom reading while in room was 198 mg/dL.  Mom has been switching her back and forth from her normal settings to her sick day settings.  She is having very poor po intake and was having some lows at home which is when her mom switched her to her sick day settings.   Mom is very tired and will likely need to go home and sleep tonight.  If patient is unable to operate pump; it should be removed and basal/bolus initiated.    If that were to happen please consider:  Semglee 22 units QD Novolog 0-9 units Q4H while not eating  If her Dexcom or pump should be removed, please give to family as these devices are very expensive.    Mother appreciative of visit.  Will continue to follow while inpatient.  Thank you, Reche Dixon, MSN, [place] Diabetes Coordinator Inpatient Diabetes Program [phone] (team pager  from 8a-5p)

## 2021-12-22 NOTE — ED Notes (Signed)
Report called to care link 

## 2021-12-22 NOTE — H&P (Signed)
History and Physical    Patient: Tracey Perkins IEP:[id number] DOB: [birth year] DOA: 12/22/2021 DOS: the patient was seen and examined on 12/22/2021 PCP: Prince Solian, MD  Patient coming from: Home - lives with parents; NOK: Mother, Adelita Hone, [phone]   Chief Complaint: N/V/D  HPI: Tracey Perkins is a 36 y.o. 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  The whole family had viral gastroenteritis around Thanksgiving; she was seen in the ER for this on 11/26 and had some improvement but ongoing loose stools.  She started again yesterday (12/5) AM with diarrhea (maybe 12 stools since?) as well and n/v (resolved?) after having tea at the [facility].  No one else is still sick.  No fever.      ER Course:  [facility] to [facility] transfer, per Dr. Velia Meyer:  Nausea, vomiting, diarrhea x 2 days with mild abdominal discomfort.  No melena or hematochezia. She has been able to tolerate very little oral intake over the last 2 days and is currently unable to keep anything down.   Vital signs appear stable,no hypotension..   Blood sugars found to be in 70s, without any evidence of anion gap metabolic acidosis.  C. difficile ordered, with result currently pending.   CT abdomen/pelvis showing evidence of colitis involving the ascending and transverse colon, suspicious for infectious colitis.  She was started on Zosyn and given IV fluids.      Review of Systems: As mentioned in the history of present illness. All other systems reviewed and are negative. Past Medical History:  Diagnosis Date   Asthma    Brain tumor ([place])    Diabetes mellitus without complication ([place])    Eczema    Food allergy    Gastroparesis    Hypokalemia    Hypomagnesemia    Hypothyroid    Neuropathy    Post concussion syndrome 05/2019   Vision changes    Past Surgical History:   Procedure Laterality Date   BRAIN SURGERY     Portacath placement and removed     WISDOM TOOTH EXTRACTION     Social History:  reports that she has never smoked. She has never used smokeless tobacco. She reports that she does not drink alcohol and does not use drugs.  Allergies  Allergen Reactions   Banana Swelling and Other (See Comments)    Mouth burning and swollen   Bactrim [Sulfamethoxazole-Trimethoprim] Hives   Doxycycline Hives   Gentian Violet Other (See Comments)    Mouth burns   Gentian Violet-Proflavine Sulfate [Triple Dye] Other (See Comments)    Mouth burns   Mold Extract [Trichophyton] Other (See Comments)    Hay Fever   Morphine And Related Hives   Other Hives, Itching and Other (See Comments)    "Hay fever, dust and pollen."  Per patient.- itching, runny nose   Vancomycin Other (See Comments)    Red man syndrome     Family History  Problem Relation Age of Onset   Thyroid disease Mother    Eczema Sister    Thyroid disease Sister    Thyroid disease Sister     Prior to Admission medications   Medication Sig Start Date End Date Taking? Authorizing Provider  acetaminophen (TYLENOL) 500 MG tablet Take 500 mg by mouth every 6 (six) hours as needed for mild pain or headache.    [provider]  aspirin EC 81  MG tablet Take 81 mg by mouth daily. Swallow whole.    [provider]  Azelastine HCl 137 MCG/SPRAY SOLN PLACE 2 SPRAYS INTO BOTH NOSTRILS 2 (TWO) TIMES DAILY Patient taking differently: Place 2 sprays into both nostrils 2 (two) times daily. 06/09/21   Dara Hoyer, FNP  citalopram (CELEXA) 10 MG tablet Take 0.5 tablets (5 mg total) by mouth at bedtime. 09/02/21   Salley Slaughter, NP  Continuous Blood Gluc Sensor (DEXCOM G6 SENSOR) MISC Apply 1 Device topically See admin instructions. Place 1 sensor into the skin every 10 days 06/03/21   [provider]  Continuous Blood Gluc Transmit (DEXCOM G6 TRANSMITTER) MISC USE 1 EACH EVERY 3  (THREE) MONTHS 01/26/21   [provider]  DERMOTIC 0.01 % OIL Place 4 drops into both ears See admin instructions. Place 4 drops into both ears 2 times a week as needed/as directed 05/15/20   [provider]  EPINEPHrine 0.3 mg/0.3 mL IJ SOAJ injection Use as directed for severe allergic reaction. 01/29/21   Ambs, Kathrine Cords, FNP  FLORASTOR 250 MG capsule Take 250 mg by mouth in the morning.    [provider]  fluconazole (DIFLUCAN) 150 MG tablet Take 150 mg by mouth as needed (as directed, for yeast infections). 05/21/21   [provider]  fluticasone (FLONASE) 50 MCG/ACT nasal spray 2 sprays per nostril daily as needed for stuffy nose. Patient taking differently: Place 2 sprays into both nostrils daily. 06/09/21   Dara Hoyer, FNP  fluticasone (FLOVENT HFA) 110 MCG/ACT inhaler Inhale 2 puffs into the lungs 2 (two) times daily as needed (for asthma flares, until cough and wheeze-free). 07/14/21   Aline August, MD  gabapentin (NEURONTIN) 300 MG capsule Take 300-600 mg by mouth See admin instructions. Take 300 mg by mouth in the morning and 600 mg at bedtime 11/13/19 06/03/22  [provider]  Glucagon 3 MG/DOSE POWD Place 3 mg into the nose as needed (low blood sugar).    [provider]  insulin aspart (NOVOLOG) 100 UNIT/ML injection Inject into the skin See admin instructions. Per pump 12/24/19   [provider]  Insulin Disposable Pump (OMNIPOD 5 G6 POD, GEN 5,) MISC Inject 1 Device into the skin See admin instructions. Place 1 pod into the skin every 2-3 days 06/04/21   [provider]  levothyroxine (SYNTHROID) 88 MCG tablet Take 88 mcg by mouth daily before breakfast. 04/05/21   [provider]  loratadine (CLARITIN) 10 MG tablet Take 1 tablet (10 mg total) by mouth daily as needed for allergies. Patient taking differently: Take 10 mg by mouth in the morning. 06/09/21   Ambs, Kathrine Cords, FNP  lubiprostone (AMITIZA) 8 MCG capsule  Take 16 mcg by mouth daily with breakfast.     [provider]  magnesium oxide (MAG-OX) 400 (240 Mg) MG tablet Take 800 mg by mouth at bedtime.    [provider]  metoCLOPramide (REGLAN) 5 MG tablet Take 1 tablet (5 mg total) by mouth 3 (three) times daily before meals for 10 days. 07/14/21 07/24/21  Aline August, MD  montelukast (SINGULAIR) 10 MG tablet TAKE 1 TABLET (10 MG TOTAL) BY MOUTH AT BEDTIME. TO PREVENT COUGHING OR WHEEZING 08/24/21   Ambs, Kathrine Cords, FNP  Omega-3 Fatty Acids (FISH OIL PO) Take 1 capsule by mouth daily with supper.    [provider]  ondansetron (ZOFRAN) 4 MG tablet Take 1 tablet (4 mg total) by mouth every 8 (  eight) hours as needed for nausea or vomiting. 07/14/21   Aline August, MD  pantoprazole (PROTONIX) 20 MG tablet Take 1 tablet (20 mg total) by mouth daily. Patient taking differently: Take 20 mg by mouth daily as needed for heartburn or indigestion. 06/09/21 06/09/22  Dara Hoyer, FNP  Polyethyl Glycol-Propyl Glycol (SYSTANE OP) Place 1 drop into both eyes 3 (three) times daily as needed (for dryness).    [provider]  potassium chloride (KLOR-CON) 10 MEQ tablet Take 10 mEq by mouth daily. 03/23/21   [provider]  pravastatin (PRAVACHOL) 20 MG tablet Take 20 mg by mouth daily after supper.    [provider]  PROAIR HFA 108 [phone] Base) MCG/ACT inhaler Inhale 2 puffs into the lungs every 4 (four) hours as needed for wheezing or shortness of breath. TAKE 2 PUFFS BY MOUTH EVERY 4 HOURS AS NEEDED Strength: 108 (90 Base) MCG/ACT Patient taking differently: Inhale 2 puffs into the lungs every 4 (four) hours as needed for wheezing or shortness of breath. 06/09/21   Ambs, Kathrine Cords, FNP  vitamin B-12 (CYANOCOBALAMIN) 1000 MCG tablet Take 1,000 mcg by mouth every 7 (seven) days.    [provider]    Physical Exam: Vitals:   12/22/21 0600 12/22/21 0615 12/22/21 0630 12/22/21 0939  BP: 102/74 107/78 119/82 114/77   Pulse: 100 99 98 100  Resp:   16 18  Temp:    98 F (36.7 C)  TempSrc:    Oral  SpO2: 94% 97% 94% 99%  Weight:      Height:       General:  Appears calm and comfortable and is in NAD Eyes:   EOMI, normal lids, iris ENT:   hearing aids in place, grossly normal lips & tongue, mild dry mm; appropriate dentition Neck:  no LAD, masses or thyromegaly Cardiovascular:  RRR, no m/r/g. No LE edema.  Respiratory:   CTA bilaterally with no wheezes/rales/rhonchi.  Normal respiratory effort. Abdomen:  soft, mildly TTP diffusely, ND Skin:  no rash or induration seen on limited exam Musculoskeletal:  grossly normal tone BUE/BLE, good ROM, no bony abnormality Psychiatric:  grossly normal mood and affect, speech fluent and appropriate, AOx3, ?MCI Neurologic:  CN 2-12 grossly intact, moves all extremities in coordinated fashion   Radiological Exams on Admission: Independently reviewed - see discussion in A/P where applicable  CT ABDOMEN PELVIS W CONTRAST  Result Date: 12/22/2021 CLINICAL DATA:  Abdominal pain, vomiting, and diarrhea. History of diabetes and gastroparesis. EXAM: CT ABDOMEN AND PELVIS WITH CONTRAST TECHNIQUE: Multidetector CT imaging of the abdomen and pelvis was performed using the standard protocol following bolus administration of intravenous contrast. RADIATION DOSE REDUCTION: This exam was performed according to the departmental dose-optimization program which includes automated exposure control, adjustment of the mA and/or kV according to patient size and/or use of iterative reconstruction technique. CONTRAST:  157m OMNIPAQUE IOHEXOL 300 MG/ML  SOLN COMPARISON:  07/12/2021. FINDINGS: Lower chest: No acute abnormality. Hepatobiliary: Focal fatty infiltration is noted in the left lobe of the liver adjacent to the falciform ligament. No biliary ductal dilatation. The gallbladder is without stones. Pancreas: Unremarkable. No pancreatic ductal dilatation or surrounding inflammatory  changes. Spleen: Normal in size without focal abnormality. Adrenals/Urinary Tract: The adrenal glands are within normal limits. The kidneys enhance symmetrically. No renal calculus or hydronephrosis. The bladder is unremarkable. Stomach/Bowel: Stomach is within normal limits. Appendix appears normal. No bowel obstruction, free air, or pneumatosis. There is colonic wall thickening involving the  ascending colon and proximal transverse colon with mild surrounding inflammatory changes. Vascular/Lymphatic: No significant vascular findings are present. No enlarged abdominal or pelvic lymph nodes. Reproductive: The uterus is within normal limits.  No adnexal mass. Other: No abdominopelvic ascites. Musculoskeletal: No acute osseous abnormality. IMPRESSION: Colonic wall thickening involving the ascending and proximal transverse colon, suggesting colitis. Electronically Signed   By: Brett Fairy M.D.   On: 12/22/2021 04:37    EKG: not done   Labs on Admission: I have personally reviewed the available labs and imaging studies at the time of the admission.  Pertinent labs:    BUN 14/Creatinine 1.04/GFR >60 WBC 14.7 Platelets 482 Upreg negative COVID/flu negative UA: large Hgb, rare bacteria   Assessment and Plan: Principal Problem:   Colitis Active Problems:   Mild persistent asthma without complication   Type 1 diabetes (HCC)   Anxiety   History of CVA (cerebrovascular accident)   Hypothyroidism    Colitis -Patient with h/o apparent gastroenteritis about a week ago, presenting with recurrent symptoms -Based on immunocompromise in the setting of T1DM, this appears to be more likely recurrence of prior GI illness although new infection is also a consideration -Mild leukocytosis -CT with evidence of colitis -Will continue treatment with Zosyn for now -IVF -Admit to med surg -Stool studies and C diff are pending  Type 1 DM -Continue insulin pump -Fortunately, she is not in DKA at this  time -Her last A1c was 8.3 on 9/20, suboptimal control -Continue gabapentin  H/o CVA -Continue ASA, pravastatin  Mood d/o -Continue citalopram  Hypothyroidism -Continue Synthroid  Asthma -Continue [place], albuterol prn    Advance Care Planning:   Code Status: Full Code   Consults: None  DVT Prophylaxis: Lovenox  Family Communication: Mother was present throughout evaluation  Severity of Illness: The appropriate patient status for this patient is INPATIENT. Inpatient status is judged to be reasonable and necessary in order to provide the required intensity of service to ensure the patient's safety. The patient's presenting symptoms, physical exam findings, and initial radiographic and laboratory data in the context of their chronic comorbidities is felt to place them at high risk for further clinical deterioration. Furthermore, it is not anticipated that the patient will be medically stable for discharge from the hospital within 2 midnights of admission.   * I certify that at the point of admission it is my clinical judgment that the patient will require inpatient hospital care spanning beyond 2 midnights from the point of admission due to high intensity of service, high risk for further deterioration and high frequency of surveillance required.*  Author: Karmen Bongo, MD 12/22/2021 1:53 PM  For on call review [email or web address].

## 2021-12-22 NOTE — Progress Notes (Signed)
Hospitalist Transfer Note:   Transferring facility: [facility] Requesting provider: Dr. Randal Buba (EDP at [facility]) Reason for transfer: admission for further evaluation and management of suspected infectious colitis.     36year old  with medical history notable for type 1 diabetes mellitus, who presented to [facility] ED complaining of nausea, vomiting, diarrhea over the last 2 days associate with mild abdominal discomfort.  Denies any associated melena or hematochezia. She has been able to tolerate very little oral intake over the last 2 days and is currently unable to keep anything down.  Vital signs appear stable, without any associated hypotension..  Blood sugars found to be in 70s, without any evidence of anion gap metabolic acidosis.  C. difficile ordered, with result currently pending.   CT abdomen/pelvis showing evidence of colitis involving the ascending and transverse colon, suspicious for infectious colitis.  She was started on Zosyn and given IV fluids.    Subsequently, I accepted this patient for transfer for observation  to a med-surg bed at [facility] or [facility] (first available)  for further work-up and management of the above .       Check [email or web address] for on-call coverage.   Nursing staff, Please call [place] number on Amion as soon as patient's arrival, so appropriate admitting provider can evaluate the pt.     Babs Bertin, DO Hospitalist

## 2021-12-22 NOTE — ED Notes (Signed)
Stool specimen obtained and sent to lab

## 2021-12-23 DIAGNOSIS — K529 Noninfective gastroenteritis and colitis, unspecified: Secondary | ICD-10-CM | POA: Diagnosis not present

## 2021-12-23 DIAGNOSIS — E109 Type 1 diabetes mellitus without complications: Secondary | ICD-10-CM

## 2021-12-23 DIAGNOSIS — R112 Nausea with vomiting, unspecified: Secondary | ICD-10-CM | POA: Diagnosis not present

## 2021-12-23 DIAGNOSIS — E860 Dehydration: Secondary | ICD-10-CM

## 2021-12-23 LAB — CBC
HCT: 32.9 % — ABNORMAL LOW (ref 36.0–46.0)
Hemoglobin: 10.9 g/dL — ABNORMAL LOW (ref 12.0–15.0)
MCH: 29.9 pg (ref 26.0–34.0)
MCHC: 33.1 g/dL (ref 30.0–36.0)
MCV: 90.4 fL (ref 80.0–100.0)
Platelets: 386 10*3/uL (ref 150–400)
RBC: 3.64 MIL/uL — ABNORMAL LOW (ref 3.87–5.11)
RDW: 13.9 % (ref 11.5–15.5)
WBC: 9.2 10*3/uL (ref 4.0–10.5)
nRBC: 0 % (ref 0.0–0.2)

## 2021-12-23 LAB — BASIC METABOLIC PANEL
Anion gap: 10 (ref 5–15)
BUN: 8 mg/dL (ref 6–20)
CO2: 26 mmol/L (ref 22–32)
Calcium: 8.4 mg/dL — ABNORMAL LOW (ref 8.9–10.3)
Chloride: 99 mmol/L (ref 98–111)
Creatinine, Ser: 1.12 mg/dL — ABNORMAL HIGH (ref 0.44–1.00)
GFR, Estimated: >60 mL/min (ref >60)
Glucose, Bld: 213 mg/dL — ABNORMAL HIGH (ref 70–99)
Potassium: 3.5 mmol/L (ref 3.5–5.1)
Sodium: 135 mmol/L (ref 135–145)

## 2021-12-23 LAB — HIV ANTIBODY (ROUTINE TESTING W REFLEX): HIV Screen 4th Generation wRfx: NONREACTIVE

## 2021-12-23 LAB — GLUCOSE, CAPILLARY
Glucose-Capillary: 199 mg/dL — ABNORMAL HIGH (ref 70–99)
Glucose-Capillary: 233 mg/dL — ABNORMAL HIGH (ref 70–99)
Glucose-Capillary: 229 mg/dL — ABNORMAL HIGH (ref 70–99)
Glucose-Capillary: 220 mg/dL — ABNORMAL HIGH (ref 70–99)
Glucose-Capillary: 311 mg/dL — ABNORMAL HIGH (ref 70–99)

## 2021-12-23 NOTE — Progress Notes (Signed)
[facility] PROGRESS NOTE    Progress Note  Tracey Perkins  IRS:[id number] DOB: Oct 18, 1985 DOA: 12/22/2021 PCP: Prince Solian, MD     Brief Narrative:   Tracey Perkins is an 36 y.o. 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   Assessment/Plan:   Possible infectious colitis colitis: With a history of diarrhea for 1 week. CT of the abdomen pelvis showed colitis. Started on IV fluids, C. difficile PCR is negative. Discontinue IV Zosyn. No leukocytosis no blood in stool. GI panel pending. Allow regular diet no diary products.  Type I diabetes mellitus type 2: Fortunately she is not in DKA, with an A1c of 8.3. Continue insulin pump for patient's management, blood glucose is trending down continue to monitor.  History of CVA: Continue aspirin and statins.  Mood disorder: Continue citalopram.  Hypothyroidism: Continue Synthroid.  Mild persistent asthma without complication Continue current regimen currently asymptomatic.   DVT prophylaxis: lovenox Family Communication:none Status is: Inpatient Remains inpatient appropriate because: infectious colitis        Code Status:     Code Status Orders  (From admission, onward)           Start     Ordered   12/22/21 1145  Full code  Continuous        12/22/21 1145           Code Status History     Date Active  Date Inactive Code Status Order ID Comments User Context   07/12/2021 1938 07/14/2021 1606 Full Code [id number]  Shela Leff, MD Inpatient   09/03/2020 0514 09/07/2020 2040 Full Code [id number]  Rise Patience, MD ED         IV Access:   Peripheral IV   Procedures and diagnostic studies:   CT ABDOMEN PELVIS W CONTRAST  Result Date: 12/22/2021 CLINICAL DATA:  Abdominal pain, vomiting, and diarrhea. History of diabetes and gastroparesis. EXAM: CT ABDOMEN AND PELVIS WITH CONTRAST TECHNIQUE: Multidetector CT imaging of the abdomen and pelvis was performed using the standard protocol following bolus administration of intravenous contrast. RADIATION DOSE REDUCTION: This exam was performed according to the departmental dose-optimization program which includes automated exposure control, adjustment of the mA and/or kV according to patient size and/or use of iterative reconstruction technique. CONTRAST:  144m OMNIPAQUE IOHEXOL 300 MG/ML  SOLN COMPARISON:  07/12/2021. FINDINGS: Lower chest: No acute abnormality. Hepatobiliary: Focal fatty infiltration is noted in the left lobe of the liver adjacent to the falciform ligament. No biliary ductal dilatation. The gallbladder is without stones. Pancreas: Unremarkable. No pancreatic ductal dilatation or surrounding inflammatory changes. Spleen: Normal in size without focal abnormality. Adrenals/Urinary Tract: The adrenal glands are within normal limits. The kidneys enhance symmetrically. No renal calculus or hydronephrosis. The bladder is unremarkable. Stomach/Bowel: Stomach is within normal limits. Appendix appears normal. No bowel obstruction, free air, or pneumatosis. There is colonic wall thickening involving the ascending colon and proximal transverse colon with mild surrounding inflammatory changes.  Vascular/Lymphatic: No significant vascular findings are present. No enlarged abdominal or pelvic lymph nodes. Reproductive: The uterus is within normal  limits.  No adnexal mass. Other: No abdominopelvic ascites. Musculoskeletal: No acute osseous abnormality. IMPRESSION: Colonic wall thickening involving the ascending and proximal transverse colon, suggesting colitis. Electronically Signed   By: Brett Fairy M.D.   On: 12/22/2021 04:37     Medical Consultants:   None.   Subjective:    Tracey Perkins feels better will like to try a regular diet  Objective:    Vitals:   12/22/21 0939 12/22/21 1501 12/23/21 0450 12/23/21 0731  BP: 114/77 113/80 122/80 116/82  Pulse: 100 93 65 86  Resp: '18 16 16 18  '$ Temp: 98 F (36.7 C) 97.7 F (36.5 C) 98.9 F (37.2 C) (!) 97.5 F (36.4 C)  TempSrc: Oral   Oral  SpO2: 99% 94% 98% 96%  Weight:      Height:       SpO2: 96 %   Intake/Output Summary (Last 24 hours) at 12/23/2021 1003 Last data filed at 12/23/2021 0616 Gross per 24 hour  Intake 304.61 ml  Output --  Net 304.61 ml   Filed Weights   12/22/21 0100  Weight: 88.5 kg    Exam: General exam: In no acute distress. Respiratory system: Good air movement and clear to auscultation. Cardiovascular system: S1 & S2 heard, RRR. No JVD. Gastrointestinal system: Abdomen is nondistended, soft and nontender.  Extremities: No pedal edema. Skin: No rashes, lesions or ulcers Psychiatry: Judgement and insight appear normal. Mood & affect appropriate.    Data Reviewed:    Labs: Basic Metabolic Panel: Recent Labs  Lab 12/22/21 0117 12/23/21 0513  NA 138 135  K 3.8 3.5  CL 101 99  CO2 29 26  GLUCOSE 86 213*  BUN 14 8  CREATININE 1.04* 1.12*  CALCIUM 9.2 8.4*   GFR Estimated Creatinine Clearance: 73.9 mL/min (A) (by C-G formula based on SCr of 1.12 mg/dL (H)). Liver Function Tests: Recent Labs  Lab 12/22/21 0117  AST 22  ALT 37  ALKPHOS 109  BILITOT 0.5  PROT 8.1  ALBUMIN 4.2   Recent Labs  Lab 12/22/21 0117  LIPASE 35   No results for input(s): "AMMONIA" in the last 168 hours. Coagulation profile No results  for input(s): "INR", "PROTIME" in the last 168 hours. COVID-19 Labs  No results for input(s): "DDIMER", "FERRITIN", "LDH", "CRP" in the last 72 hours.  Lab Results  Component Value Date   SARSCOV2NAA NEGATIVE 12/22/2021   SARSCOV2NAA NEGATIVE 12/12/2021   SARSCOV2NAA NEGATIVE 09/02/2020   [place] NEGATIVE 06/03/2020    CBC: Recent Labs  Lab 12/22/21 0117 12/23/21 0513  WBC 14.7* 9.2  HGB 13.4 10.9*  HCT 40.2 32.9*  MCV 89.1 90.4  PLT 482* 386   Cardiac Enzymes: No results for input(s): "CKTOTAL", "CKMB", "CKMBINDEX", "TROPONINI" in the last 168 hours. BNP (last 3 results) No results for input(s): "PROBNP" in the last 8760 hours. CBG: Recent Labs  Lab 12/22/21 0752 12/22/21 1023 12/22/21 2147 12/23/21 0259 12/23/21 0749  GLUCAP 301* 321* 285* 199* 233*   D-Dimer: No results for input(s): "DDIMER" in the last 72 hours. Hgb A1c: No results for input(s): "HGBA1C" in the last 72 hours. Lipid Profile: No results for input(s): "CHOL", "HDL", "LDLCALC", "TRIG", "CHOLHDL", "LDLDIRECT" in the last 72 hours. Thyroid function studies: No results for input(s): "TSH", "T4TOTAL", "T3FREE", "THYROIDAB" in the last 72 hours.  Invalid input(s): "FREET3" Anemia work up: No  results for input(s): "VITAMINB12", "FOLATE", "FERRITIN", "TIBC", "IRON", "RETICCTPCT" in the last 72 hours. Sepsis Labs: Recent Labs  Lab 12/22/21 0117 12/23/21 0513  WBC 14.7* 9.2   Microbiology Recent Results (from the past 240 hour(s))  Resp Panel by RT-PCR (Flu A&B, Covid) Anterior Nasal Swab     Status: None   Collection Time: 12/22/21  2:28 AM   Specimen: Anterior Nasal Swab  Result Value Ref Range Status   SARS Coronavirus 2 by RT PCR NEGATIVE NEGATIVE Final    Comment: (NOTE) SARS-CoV-2 target nucleic acids are NOT DETECTED.  The SARS-CoV-2 RNA is generally detectable in upper respiratory specimens during the acute phase of infection. The lowest concentration of SARS-CoV-2 viral copies  this assay can detect is 138 copies/mL. A negative result does not preclude SARS-Cov-2 infection and should not be used as the sole basis for treatment or other patient management decisions. A negative result may occur with  improper specimen collection/handling, submission of specimen other than nasopharyngeal swab, presence of viral mutation(s) within the areas targeted by this assay, and inadequate number of viral copies(<138 copies/mL). A negative result must be combined with clinical observations, patient history, and epidemiological information. The expected result is Negative.  Fact Sheet for Patients:  EntrepreneurPulse.com.au  Fact Sheet for Healthcare Providers:  IncredibleEmployment.be  This test is no t yet approved or cleared by the [place] FDA and  has been authorized for detection and/or diagnosis of SARS-CoV-2 by FDA under an Emergency Use Authorization (EUA). This EUA will remain  in effect (meaning this test can be used) for the duration of the COVID-19 declaration under Section 564(b)(1) of the Act, 21 U.S.C.section 360bbb-3(b)(1), unless the authorization is terminated  or revoked sooner.       Influenza A by PCR NEGATIVE NEGATIVE Final   Influenza B by PCR NEGATIVE NEGATIVE Final    Comment: (NOTE) The Xpert Xpress SARS-CoV-2/FLU/RSV plus assay is intended as an aid in the diagnosis of influenza from Nasopharyngeal swab specimens and should not be used as a sole basis for treatment. Nasal washings and aspirates are unacceptable for Xpert Xpress SARS-CoV-2/FLU/RSV testing.  Fact Sheet for Patients: EntrepreneurPulse.com.au  Fact Sheet for Healthcare Providers: IncredibleEmployment.be  This test is not yet approved or cleared by the [place] FDA and has been authorized for detection and/or diagnosis of SARS-CoV-2 by FDA under an Emergency Use Authorization (EUA). This EUA  will remain in effect (meaning this test can be used) for the duration of the COVID-19 declaration under Section 564(b)(1) of the Act, 21 U.S.C. section 360bbb-3(b)(1), unless the authorization is terminated or revoked.  Performed at [facility], [address] [place], [name] [address]   C Difficile Quick Screen w PCR reflex     Status: None   Collection Time: 12/22/21  8:09 AM   Specimen: STOOL  Result Value Ref Range Status   C Diff antigen NEGATIVE NEGATIVE Final   C Diff toxin NEGATIVE NEGATIVE Final   C Diff interpretation No C. difficile detected.  Final    Comment: Performed at [facility], [place] [address] [place], [name] [address]     Medications:    aspirin EC  81 mg Oral Daily   citalopram  5 mg Oral QHS   enoxaparin (LOVENOX) injection  40 mg Subcutaneous Q24H   gabapentin  300 mg Oral Daily   And   gabapentin  600 mg Oral QHS   insulin pump   Subcutaneous TID WC, HS,  0200   levothyroxine  75 mcg Oral QAC breakfast   loratadine  10 mg Oral q AM   montelukast  10 mg Oral QHS   pravastatin  20 mg Oral QPC supper   saccharomyces boulardii  250 mg Oral q AM   Continuous Infusions:  lactated ringers Stopped (12/22/21 1437)   piperacillin-tazobactam (ZOSYN)  IV 3.375 g (12/23/21 0625)      LOS: 1 day   Charlynne Cousins  [facility]  12/23/2021, 10:03 AM

## 2021-12-24 DIAGNOSIS — R112 Nausea with vomiting, unspecified: Secondary | ICD-10-CM | POA: Diagnosis not present

## 2021-12-24 DIAGNOSIS — E109 Type 1 diabetes mellitus without complications: Secondary | ICD-10-CM | POA: Diagnosis not present

## 2021-12-24 DIAGNOSIS — K529 Noninfective gastroenteritis and colitis, unspecified: Secondary | ICD-10-CM | POA: Diagnosis not present

## 2021-12-24 DIAGNOSIS — E860 Dehydration: Secondary | ICD-10-CM | POA: Diagnosis not present

## 2021-12-24 LAB — GLUCOSE, CAPILLARY
Glucose-Capillary: 288 mg/dL — ABNORMAL HIGH (ref 70–99)
Glucose-Capillary: 280 mg/dL — ABNORMAL HIGH (ref 70–99)

## 2021-12-24 NOTE — Discharge Summary (Signed)
Physician Discharge Summary  Tracey Perkins ZLD:[id number] DOB: October 21, 1985 DOA: 12/22/2021  PCP: Prince Solian, MD  Admit date: 12/22/2021 Discharge date: 12/24/2021  Admitted From: Home Disposition:  Home  Recommendations for Outpatient Follow-up:  Follow up with PCP in 1-2 weeks Please obtain BMP/CBC in one week   Home Health:No Equipment/Devices:None  Discharge Condition:Stable CODE STATUS:Full Diet recommendation: Heart Healthy  Brief/Interim Summary:  36 y.o. 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   Discharge Diagnoses:  Principal Problem:   Colitis Active Problems:   Mild persistent asthma without complication   Type 1 diabetes (HCC)   Anxiety   History of CVA (cerebrovascular accident)   Diarrhea   Hypothyroidism  Possible infectious viral colitis: With history of diarrhea in 1 week. CT scan of the abdomen pelvis showed colitis. C. difficile PCR was negative she defervesced remain afebrile she got 2 doses of IV antibiotics. These were stopped as she had no blood in stools and her diarrhea has resolved. She had no further bowel movement after she was admitted.  Diabetes mellitus type 1: She was continued on her insulin pump and A1c of 8.3.  History of CVA: Continue aspirin and statins.  Mood disorder: No changes to her regimen.  Hypothyroidism: No changes made to  her regimen.    Discharge Instructions  Discharge Instructions     Diet - low sodium heart healthy   Complete by: As directed    Increase activity slowly   Complete by: As directed       Allergies as of 12/24/2021       Reactions   Banana Swelling, Other (See Comments)   Mouth burning and swollen   Bactrim [sulfamethoxazole-trimethoprim] Hives   Doxycycline Hives   Gentian Violet Other (See Comments)   Mouth burns   Gentian Violet-proflavine Sulfate [triple Dye] Other (See Comments)   Mouth burns   Mold Extract [trichophyton] Other (See Comments)   Hay Fever   Morphine And Related Hives   Other Hives, Itching, Other (See Comments)   "Hay fever, dust and pollen."  Per patient.- itching, runny nose   Vancomycin Other (See Comments)   Red man syndrome         Medication List     TAKE these medications    acetaminophen 500 MG tablet Commonly known as: TYLENOL Take 500 mg by mouth every 6 (six) hours as needed for mild pain or headache.   aspirin EC 81 MG tablet Take 81 mg by mouth daily. Swallow whole.   Azelastine HCl 137 MCG/SPRAY Soln PLACE 2 SPRAYS INTO BOTH NOSTRILS 2 (TWO) TIMES DAILY What changed:  how much to take how to take this when to take this reasons to take this additional instructions   citalopram 10 MG tablet Commonly known as: CELEXA Take 0.5 tablets (5 mg total) by mouth at bedtime.   cyanocobalamin 1000 MCG tablet Commonly known as: VITAMIN B12 Take 1,000 mcg by mouth every 7 (seven) days.  DermOtic 0.01 % Oil Generic drug: Fluocinolone Acetonide Place 4 drops into both ears See admin instructions. Place 4 drops into both ears 2 times a week as needed/as directed   Dexcom G6 Sensor Misc Apply 1 Device topically See admin instructions. Place 1 sensor into the skin every 10 days   Dexcom G6 Transmitter Misc USE 1 EACH EVERY 3 (THREE) MONTHS   EPINEPHrine 0.3 mg/0.3 mL Soaj injection Commonly known as: EPI-PEN Use as directed  for severe allergic reaction.   FISH OIL PO Take 1 capsule by mouth daily with supper.   Florastor 250 MG capsule Generic drug: saccharomyces boulardii Take 250 mg by mouth in the morning.   fluconazole 150 MG tablet Commonly known as: DIFLUCAN Take 150 mg by mouth as needed (as directed, for yeast infections).   fluticasone 110 MCG/ACT inhaler Commonly known as: Flovent HFA Inhale 2 puffs into the lungs 2 (two) times daily as needed (for asthma flares, until cough and wheeze-free).   fluticasone 50 MCG/ACT nasal spray Commonly known as: FLONASE 2 sprays per nostril daily as needed for stuffy nose. What changed:  how much to take how to take this when to take this additional instructions   gabapentin 300 MG capsule Commonly known as: NEURONTIN Take 300-600 mg by mouth See admin instructions. Take 300 mg by mouth in the morning and 600 mg at bedtime   Glucagon 3 MG/DOSE Powd Place 3 mg into the nose as needed (low blood sugar).   insulin aspart 100 UNIT/ML injection Commonly known as: novoLOG Inject into the skin See admin instructions. Per pump   levothyroxine 75 MCG tablet Commonly known as: SYNTHROID Take 75 mcg by mouth daily before breakfast.   loratadine 10 MG tablet Commonly known as: CLARITIN Take 1 tablet (10 mg total) by mouth daily as needed for allergies. What changed: when to take this   lubiprostone 8 MCG capsule Commonly known as: AMITIZA Take 16 mcg by mouth daily with breakfast.   magnesium oxide 400 (240 Mg) MG tablet Commonly known as: MAG-OX Take 800 mg by mouth at bedtime.   montelukast 10 MG tablet Commonly known as: SINGULAIR TAKE 1 TABLET (10 MG TOTAL) BY MOUTH AT BEDTIME. TO PREVENT COUGHING OR WHEEZING   Omnipod 5 G6 Pod (Gen 5) Misc Inject 1 Device into the skin See admin instructions. Place 1 pod into the skin every 2-3 days   ondansetron 4 MG tablet Commonly known as: ZOFRAN Take 1 tablet (4 mg total) by mouth every 8 (eight)  hours as needed for nausea or vomiting.   pantoprazole 20 MG tablet Commonly known as: Protonix Take 1 tablet (20 mg total) by mouth daily. What changed:  when to take this reasons to take this   potassium chloride 10 MEQ tablet Commonly known as: KLOR-CON Take 10 mEq by mouth daily.   pravastatin 20 MG tablet Commonly known as: PRAVACHOL Take 20 mg by mouth daily after supper.   ProAir HFA 108 (90 Base) MCG/ACT inhaler Generic drug: albuterol Inhale 2 puffs into the lungs every 4 (four) hours as needed for wheezing or shortness of breath. TAKE 2 PUFFS BY MOUTH EVERY 4 HOURS AS NEEDED Strength: 108 (90 Base) MCG/ACT What changed: additional instructions   SYSTANE OP Place 1 drop into both eyes 3 (three) times daily as needed (for dryness).        Allergies  Allergen Reactions   Banana Swelling and Other (See Comments)    Mouth burning and swollen  Bactrim [Sulfamethoxazole-Trimethoprim] Hives   Doxycycline Hives   Gentian Violet Other (See Comments)    Mouth burns   Gentian Violet-Proflavine Sulfate [Triple Dye] Other (See Comments)    Mouth burns   Mold Extract [Trichophyton] Other (See Comments)    Hay Fever   Morphine And Related Hives   Other Hives, Itching and Other (See Comments)    "Hay fever, dust and pollen."  Per patient.- itching, runny nose   Vancomycin Other (See Comments)    Red man syndrome     Consultations: None   Procedures/Studies: CT ABDOMEN PELVIS W CONTRAST  Result Date: 12/22/2021 CLINICAL DATA:  Abdominal pain, vomiting, and diarrhea. History of diabetes and gastroparesis. EXAM: CT ABDOMEN AND PELVIS WITH CONTRAST TECHNIQUE: Multidetector CT imaging of the abdomen and pelvis was performed using the standard protocol following bolus administration of intravenous contrast. RADIATION DOSE REDUCTION: This exam was performed according to the departmental dose-optimization program which includes automated exposure control, adjustment of the  mA and/or kV according to patient size and/or use of iterative reconstruction technique. CONTRAST:  15m OMNIPAQUE IOHEXOL 300 MG/ML  SOLN COMPARISON:  07/12/2021. FINDINGS: Lower chest: No acute abnormality. Hepatobiliary: Focal fatty infiltration is noted in the left lobe of the liver adjacent to the falciform ligament. No biliary ductal dilatation. The gallbladder is without stones. Pancreas: Unremarkable. No pancreatic ductal dilatation or surrounding inflammatory changes. Spleen: Normal in size without focal abnormality. Adrenals/Urinary Tract: The adrenal glands are within normal limits. The kidneys enhance symmetrically. No renal calculus or hydronephrosis. The bladder is unremarkable. Stomach/Bowel: Stomach is within normal limits. Appendix appears normal. No bowel obstruction, free air, or pneumatosis. There is colonic wall thickening involving the ascending colon and proximal transverse colon with mild surrounding inflammatory changes. Vascular/Lymphatic: No significant vascular findings are present. No enlarged abdominal or pelvic lymph nodes. Reproductive: The uterus is within normal limits.  No adnexal mass. Other: No abdominopelvic ascites. Musculoskeletal: No acute osseous abnormality. IMPRESSION: Colonic wall thickening involving the ascending and proximal transverse colon, suggesting colitis. Electronically Signed   By: LBrett FairyM.D.   On: 12/22/2021 04:37   DG Chest Portable 1 View  Result Date: 12/12/2021 CLINICAL DATA:  Fever and tachycardia per order. EXAM: PORTABLE CHEST - 1 VIEW COMPARISON:  07/12/2021 FINDINGS: Lungs are clear. Heart size and mediastinal contours are within normal limits. No effusion. Visualized bones unremarkable. IMPRESSION: No acute cardiopulmonary disease. Electronically Signed   By: DLucrezia EuropeM.D.   On: 12/12/2021 19:48     Subjective: No complaints  Discharge Exam: Vitals:   12/23/21 2017 12/24/21 0249  BP: 110/76 108/75  Pulse: 80 84  Resp: 17 15   Temp: 97.9 F (36.6 C) 98 F (36.7 C)  SpO2: 97% 93%   Vitals:   12/23/21 0731 12/23/21 1547 12/23/21 2017 12/24/21 0249  BP: 116/82 109/70 110/76 108/75  Pulse: 86 82 80 84  Resp: '18 18 17 15  '$ Temp: (!) 97.5 F (36.4 C) 97.7 F (36.5 C) 97.9 F (36.6 C) 98 F (36.7 C)  TempSrc: Oral Oral Oral Oral  SpO2: 96% 95% 97% 93%  Weight:      Height:        General: Pt is alert, awake, not in acute distress Cardiovascular: RRR, S1/S2 +, no rubs, no gallops Respiratory: CTA bilaterally, no wheezing, no rhonchi Abdominal: Soft, NT, ND, bowel sounds + Extremities: no edema, no cyanosis    The results of significant diagnostics from this hospitalization (including imaging, microbiology, ancillary and laboratory)  are listed below for reference.     Microbiology: Recent Results (from the past 240 hour(s))  Resp Panel by RT-PCR (Flu A&B, Covid) Anterior Nasal Swab     Status: None   Collection Time: 12/22/21  2:28 AM   Specimen: Anterior Nasal Swab  Result Value Ref Range Status   SARS Coronavirus 2 by RT PCR NEGATIVE NEGATIVE Final    Comment: (NOTE) SARS-CoV-2 target nucleic acids are NOT DETECTED.  The SARS-CoV-2 RNA is generally detectable in upper respiratory specimens during the acute phase of infection. The lowest concentration of SARS-CoV-2 viral copies this assay can detect is 138 copies/mL. A negative result does not preclude SARS-Cov-2 infection and should not be used as the sole basis for treatment or other patient management decisions. A negative result may occur with  improper specimen collection/handling, submission of specimen other than nasopharyngeal swab, presence of viral mutation(s) within the areas targeted by this assay, and inadequate number of viral copies(<138 copies/mL). A negative result must be combined with clinical observations, patient history, and epidemiological information. The expected result is Negative.  Fact Sheet for Patients:   EntrepreneurPulse.com.au  Fact Sheet for Healthcare Providers:  IncredibleEmployment.be  This test is no t yet approved or cleared by the [place] FDA and  has been authorized for detection and/or diagnosis of SARS-CoV-2 by FDA under an Emergency Use Authorization (EUA). This EUA will remain  in effect (meaning this test can be used) for the duration of the COVID-19 declaration under Section 564(b)(1) of the Act, 21 U.S.C.section 360bbb-3(b)(1), unless the authorization is terminated  or revoked sooner.       Influenza A by PCR NEGATIVE NEGATIVE Final   Influenza B by PCR NEGATIVE NEGATIVE Final    Comment: (NOTE) The Xpert Xpress SARS-CoV-2/FLU/RSV plus assay is intended as an aid in the diagnosis of influenza from Nasopharyngeal swab specimens and should not be used as a sole basis for treatment. Nasal washings and aspirates are unacceptable for Xpert Xpress SARS-CoV-2/FLU/RSV testing.  Fact Sheet for Patients: EntrepreneurPulse.com.au  Fact Sheet for Healthcare Providers: IncredibleEmployment.be  This test is not yet approved or cleared by the [place] FDA and has been authorized for detection and/or diagnosis of SARS-CoV-2 by FDA under an Emergency Use Authorization (EUA). This EUA will remain in effect (meaning this test can be used) for the duration of the COVID-19 declaration under Section 564(b)(1) of the Act, 21 U.S.C. section 360bbb-3(b)(1), unless the authorization is terminated or revoked.  Performed at [facility], [address] [place], [name] [address]   C Difficile Quick Screen w PCR reflex     Status: None   Collection Time: 12/22/21  8:09 AM   Specimen: STOOL  Result Value Ref Range Status   C Diff antigen NEGATIVE NEGATIVE Final   C Diff toxin NEGATIVE NEGATIVE Final   C Diff interpretation No C. difficile detected.  Final    Comment: Performed at [facility], [place] [address], [place], [place] [address]     Labs: BNP (last 3 results) No results for input(s): "BNP" in the last 8760 hours. Basic Metabolic Panel: Recent Labs  Lab 12/22/21 0117 12/23/21 0513  NA 138 135  K 3.8 3.5  CL 101 99  CO2 29 26  GLUCOSE 86 213*  BUN 14 8  CREATININE 1.04* 1.12*  CALCIUM 9.2 8.4*   Liver Function Tests: Recent Labs  Lab 12/22/21 0117  AST 22  ALT 37  ALKPHOS 109  BILITOT 0.5  PROT 8.1  ALBUMIN 4.2   Recent Labs  Lab 12/22/21 0117  LIPASE 35   No results for input(s): "AMMONIA" in the last 168 hours. CBC: Recent Labs  Lab 12/22/21 0117 12/23/21 0513  WBC 14.7* 9.2  HGB 13.4 10.9*  HCT 40.2 32.9*  MCV 89.1 90.4  PLT 482* 386   Cardiac Enzymes: No results for input(s): "CKTOTAL", "CKMB", "CKMBINDEX", "TROPONINI" in the last 168 hours. BNP: Invalid input(s): "POCBNP" CBG: Recent Labs  Lab 12/23/21 0749 12/23/21 1126 12/23/21 1545 12/23/21 2028 12/24/21 0251  GLUCAP 233* 229* 220* 311* 288*   D-Dimer No results for input(s): "DDIMER" in the last 72 hours. Hgb A1c No results for input(s): "HGBA1C" in the last 72 hours. Lipid Profile No results for input(s): "CHOL", "HDL", "LDLCALC", "TRIG", "CHOLHDL", "LDLDIRECT" in the last 72 hours. Thyroid function studies No results for input(s): "TSH", "T4TOTAL", "T3FREE", "THYROIDAB" in the last 72 hours.  Invalid input(s): "FREET3" Anemia work up No results for input(s): "VITAMINB12", "FOLATE", "FERRITIN", "TIBC", "IRON", "RETICCTPCT" in the last 72 hours. Urinalysis    Component Value Date/Time   COLORURINE YELLOW 12/22/2021 [place] 12/22/2021 0437   LABSPEC 1.010 12/22/2021 0437   PHURINE 5.5 12/22/2021 0437   GLUCOSEU NEGATIVE 12/22/2021 0437   HGBUR LARGE (A) 12/22/2021 0437   BILIRUBINUR NEGATIVE 12/22/2021 0437   KETONESUR NEGATIVE 12/22/2021 0437   PROTEINUR NEGATIVE 12/22/2021 0437   UROBILINOGEN 0.2 07/14/2013 0746   NITRITE  NEGATIVE 12/22/2021 0437   LEUKOCYTESUR NEGATIVE 12/22/2021 0437   Sepsis Labs Recent Labs  Lab 12/22/21 0117 12/23/21 0513  WBC 14.7* 9.2   Microbiology Recent Results (from the past 240 hour(s))  Resp Panel by RT-PCR (Flu A&B, Covid) Anterior Nasal Swab     Status: None   Collection Time: 12/22/21  2:28 AM   Specimen: Anterior Nasal Swab  Result Value Ref Range Status   SARS Coronavirus 2 by RT PCR NEGATIVE NEGATIVE Final    Comment: (NOTE) SARS-CoV-2 target nucleic acids are NOT DETECTED.  The SARS-CoV-2 RNA is generally detectable in upper respiratory specimens during the acute phase of infection. The lowest concentration of SARS-CoV-2 viral copies this assay can detect is 138 copies/mL. A negative result does not preclude SARS-Cov-2 infection and should not be used as the sole basis for treatment or other patient management decisions. A negative result may occur with  improper specimen collection/handling, submission of specimen other than nasopharyngeal swab, presence of viral mutation(s) within the areas targeted by this assay, and inadequate number of viral copies(<138 copies/mL). A negative result must be combined with clinical observations, patient history, and epidemiological information. The expected result is Negative.  Fact Sheet for Patients:  EntrepreneurPulse.com.au  Fact Sheet for Healthcare Providers:  IncredibleEmployment.be  This test is no t yet approved or cleared by the [place] FDA and  has been authorized for detection and/or diagnosis of SARS-CoV-2 by FDA under an Emergency Use Authorization (EUA). This EUA will remain  in effect (meaning this test can be used) for the duration of the COVID-19 declaration under Section 564(b)(1) of the Act, 21 U.S.C.section 360bbb-3(b)(1), unless the authorization is terminated  or revoked sooner.       Influenza A by PCR NEGATIVE NEGATIVE Final   Influenza B by PCR  NEGATIVE NEGATIVE Final    Comment: (NOTE) The Xpert Xpress SARS-CoV-2/FLU/RSV plus assay is intended as an aid in the diagnosis of influenza from Nasopharyngeal swab specimens and should not be used as  a sole basis for treatment. Nasal washings and aspirates are unacceptable for Xpert Xpress SARS-CoV-2/FLU/RSV testing.  Fact Sheet for Patients: EntrepreneurPulse.com.au  Fact Sheet for Healthcare Providers: IncredibleEmployment.be  This test is not yet approved or cleared by the [place] FDA and has been authorized for detection and/or diagnosis of SARS-CoV-2 by FDA under an Emergency Use Authorization (EUA). This EUA will remain in effect (meaning this test can be used) for the duration of the COVID-19 declaration under Section 564(b)(1) of the Act, 21 U.S.C. section 360bbb-3(b)(1), unless the authorization is terminated or revoked.  Performed at [facility], [address] [place], [name] [address]   C Difficile Quick Screen w PCR reflex     Status: None   Collection Time: 12/22/21  8:09 AM   Specimen: STOOL  Result Value Ref Range Status   C Diff antigen NEGATIVE NEGATIVE Final   C Diff toxin NEGATIVE NEGATIVE Final   C Diff interpretation No C. difficile detected.  Final    Comment: Performed at [facility], [place] [address] [place], [place] [address]     SIGNED:   Charlynne Cousins, MD  [facility] 12/24/2021, 7:48 AM Pager   If 7PM-7AM, please contact night-coverage [email or web address] Password [email or web address]

## 2021-12-27 ENCOUNTER — Ambulatory Visit: Payer: Medicaid Other | Admitting: Physical Therapy

## 2022-01-11 ENCOUNTER — Telehealth (INDEPENDENT_AMBULATORY_CARE_PROVIDER_SITE_OTHER): Payer: Medicaid Other | Admitting: Psychiatry

## 2022-01-11 ENCOUNTER — Other Ambulatory Visit: Payer: Self-pay | Admitting: Family Medicine

## 2022-01-11 DIAGNOSIS — F330 Major depressive disorder, recurrent, mild: Secondary | ICD-10-CM

## 2022-01-11 DIAGNOSIS — F419 Anxiety disorder, unspecified: Secondary | ICD-10-CM | POA: Diagnosis not present

## 2022-01-11 MED ORDER — CITALOPRAM HYDROBROMIDE 10 MG PO TABS: 5.0000 mg | ORAL_TABLET | Freq: Every day | ORAL | 2 refills | Status: DC

## 2022-01-11 NOTE — Progress Notes (Addendum)
[facility] MD/PA/NP OP Progress Note Virtual Visit via Video Note  I connected with Tracey Perkins on 01/11/22 at  1:30 PM EST by a video enabled telemedicine application and verified that I am speaking with the correct person using two identifiers.  Location: Patient: Home Provider: Clinic   I discussed the limitations of evaluation and management by telemedicine and the availability of in person appointments. The patient expressed understanding and agreed to proceed.  I provided 20 minutes of non-face-to-face time during this encounter.    01/11/2022 1:41 PM Tracey Perkins  MRN:  [id number]  Chief Complaint:  Follow up   HPI: 36 year old female seen today for follow up psychiatric evaluation.   She has a psychiatric history of depression and anxiey.  She is currently being managed on Celexa 5 mg daily. She notes that her medication is effective in managing her psychiatric conditions.  Pt reports that her mood is " good ". She reports that her depression and anxiety are controlled with current medications.  She takes low-dose of Celexa due to brain injury.  She reports that she had malignant brain tumor when she was a child and received chemo and radiotherapy.  She also had concussion years ago and suffered a stroke last year.  She never had seizures.  She reports that she was in and out of the hospital recently due to gastric issues and now suffering from [place].  She has been sleeping and eating well.  Currently, she denies any suicidal ideations, homicidal ideations, auditory and visual hallucinations.  She denies paranoia.  She denies any medication side effects and has been tolerating it well.  She denies any other stressors other than  medical issues.   She reports no change in her current stressors.  She is currently on disability and studying Civil engineer, contracting.  She lives with her parents.  She has 2 younger sisters (52, 24).  She denies drinking drinks alcohol  and denies using any illicit  drugs.  She does not smoke cigarettes.  Patient denies any need for change in medication and medication dosages and wants to continue same meds.  She denies any other concerns. Patient is alert and oriented x 4,  calm, cooperative, and fully engaged in conversation during the encounter.  Her thought process is linear with coherent speech . She does not appear to be responding to internal/external stimuli .    No medication changes made today.  Visit Diagnosis:    ICD-10-CM   1. Anxiety  F41.9 citalopram (CELEXA) 10 MG tablet    2. Mild episode of recurrent major depressive disorder (HCC)  F33.0 citalopram (CELEXA) 10 MG tablet      Past Psychiatric History: Anxiety and depression   Past Medical History:  Past Medical History:  Diagnosis Date   Asthma    Brain tumor ([place])    Diabetes mellitus without complication ([place])    Eczema    Food allergy    Gastroparesis    Hypokalemia    Hypomagnesemia    Hypothyroid    Neuropathy    Post concussion syndrome 05/2019   Vision changes     Past Surgical History:  Procedure Laterality Date   BRAIN SURGERY     Portacath placement and removed     WISDOM TOOTH EXTRACTION      Family Psychiatric History: Maternal uncle schizophrenia and overdosed on pain pills. Paternal cousin and aunt bipolar  Family History:  Family History  Problem Relation Age of Onset   Thyroid  disease Mother    Eczema Sister    Thyroid disease Sister    Thyroid disease Sister     Social History:  Social History   Socioeconomic History   Marital status: Single    Spouse name: Not on file   Number of children: Not on file   Years of education: Not on file   Highest education level: Not on file  Occupational History   Not on file  Tobacco Use   Smoking status: Never   Smokeless tobacco: Never  Vaping Use   Vaping Use: Never used  Substance and Sexual Activity   Alcohol use: No   Drug use: No   Sexual activity: Yes    Birth control/protection: Pill   Other Topics Concern   Not on file  Social History Narrative   Not on file   Social Determinants of Health   Financial Resource Strain: Not on file  Food Insecurity: No Food Insecurity (12/23/2021)   Hunger Vital Sign    Worried About Running Out of Food in the Last Year: Never true    Ran Out of Food in the Last Year: Never true  Transportation Needs: Not on file  Physical Activity: Not on file  Stress: Not on file  Social Connections: Not on file    Allergies:  Allergies  Allergen Reactions   Banana Swelling and Other (See Comments)    Mouth burning and swollen   Bactrim [Sulfamethoxazole-Trimethoprim] Hives   Doxycycline Hives   Gentian Violet Other (See Comments)    Mouth burns   Gentian Violet-Proflavine Sulfate [Triple Dye] Other (See Comments)    Mouth burns   Mold Extract [Trichophyton] Other (See Comments)    Hay Fever   Morphine And Related Hives   Other Hives, Itching and Other (See Comments)    "Hay fever, dust and pollen."  Per patient.- itching, runny nose   Vancomycin Other (See Comments)    Red man syndrome     Metabolic Disorder Labs: Lab Results  Component Value Date   HGBA1C 8.8 (H) 07/13/2021   MPG 206 07/13/2021   MPG 197.25 09/03/2020   No results found for: "PROLACTIN" Lab Results  Component Value Date   CHOL 215 (H) 09/03/2020   TRIG 198 (H) 09/03/2020   HDL 40 (L) 09/03/2020   CHOLHDL 5.4 09/03/2020   VLDL 40 09/03/2020   LDLCALC 135 (H) 09/03/2020   Lab Results  Component Value Date   TSH 0.669 07/13/2021    Therapeutic Level Labs: No results found for: "LITHIUM" No results found for: "VALPROATE" No results found for: "CBMZ"  Current Medications: Current Outpatient Medications  Medication Sig Dispense Refill   acetaminophen (TYLENOL) 500 MG tablet Take 500 mg by mouth every 6 (six) hours as needed for mild pain or headache.     aspirin EC 81 MG tablet Take 81 mg by mouth daily. Swallow whole.     Azelastine HCl 137  MCG/SPRAY SOLN PLACE 2 SPRAYS INTO BOTH NOSTRILS 2 (TWO) TIMES DAILY (Patient taking differently: Place 2 sprays into both nostrils 2 (two) times daily as needed (allergies & congestion).) 30 mL 5   citalopram (CELEXA) 10 MG tablet Take 0.5 tablets (5 mg total) by mouth at bedtime. 15 tablet 2   Continuous Blood Gluc Sensor (DEXCOM G6 SENSOR) MISC Apply 1 Device topically See admin instructions. Place 1 sensor into the skin every 10 days     Continuous Blood Gluc Transmit (DEXCOM G6 TRANSMITTER) MISC USE 1 EACH EVERY 3 (  THREE) MONTHS     DERMOTIC 0.01 % OIL Place 4 drops into both ears See admin instructions. Place 4 drops into both ears 2 times a week as needed/as directed     EPINEPHrine 0.3 mg/0.3 mL IJ SOAJ injection Use as directed for severe allergic reaction. 2 each 1   FLORASTOR 250 MG capsule Take 250 mg by mouth in the morning.     fluconazole (DIFLUCAN) 150 MG tablet Take 150 mg by mouth as needed (as directed, for yeast infections).     fluticasone (FLONASE) 50 MCG/ACT nasal spray 2 sprays per nostril daily as needed for stuffy nose. (Patient taking differently: Place 2 sprays into both nostrils daily.) 16 g 5   fluticasone (FLOVENT HFA) 110 MCG/ACT inhaler Inhale 2 puffs into the lungs 2 (two) times daily as needed (for asthma flares, until cough and wheeze-free).     gabapentin (NEURONTIN) 300 MG capsule Take 300-600 mg by mouth See admin instructions. Take 300 mg by mouth in the morning and 600 mg at bedtime     Glucagon 3 MG/DOSE POWD Place 3 mg into the nose as needed (low blood sugar).     insulin aspart (NOVOLOG) 100 UNIT/ML injection Inject into the skin See admin instructions. Per pump     Insulin Disposable Pump (OMNIPOD 5 G6 POD, GEN 5,) MISC Inject 1 Device into the skin See admin instructions. Place 1 pod into the skin every 2-3 days     levothyroxine (SYNTHROID) 75 MCG tablet Take 75 mcg by mouth daily before breakfast.     loratadine (CLARITIN) 10 MG tablet Take 1 tablet (10  mg total) by mouth daily as needed for allergies. (Patient taking differently: Take 10 mg by mouth in the morning.) 30 tablet 5   lubiprostone (AMITIZA) 8 MCG capsule Take 16 mcg by mouth daily with breakfast.      magnesium oxide (MAG-OX) 400 (240 Mg) MG tablet Take 800 mg by mouth at bedtime.     montelukast (SINGULAIR) 10 MG tablet TAKE 1 TABLET (10 MG TOTAL) BY MOUTH AT BEDTIME. TO PREVENT COUGHING OR WHEEZING 90 tablet 1   Omega-3 Fatty Acids (FISH OIL PO) Take 1 capsule by mouth daily with supper.     ondansetron (ZOFRAN) 4 MG tablet Take 1 tablet (4 mg total) by mouth every 8 (eight) hours as needed for nausea or vomiting. 20 tablet 0   pantoprazole (PROTONIX) 20 MG tablet Take 1 tablet (20 mg total) by mouth daily. (Patient taking differently: Take 20 mg by mouth daily as needed for heartburn or indigestion.) 30 tablet 2   Polyethyl Glycol-Propyl Glycol (SYSTANE OP) Place 1 drop into both eyes 3 (three) times daily as needed (for dryness).     potassium chloride (KLOR-CON) 10 MEQ tablet Take 10 mEq by mouth daily.     pravastatin (PRAVACHOL) 20 MG tablet Take 20 mg by mouth daily after supper.     PROAIR HFA 108 (90 Base) MCG/ACT inhaler Inhale 2 puffs into the lungs every 4 (four) hours as needed for wheezing or shortness of breath. TAKE 2 PUFFS BY MOUTH EVERY 4 HOURS AS NEEDED Strength: 108 (90 Base) MCG/ACT (Patient taking differently: Inhale 2 puffs into the lungs every 4 (four) hours as needed for wheezing or shortness of breath.) 6.7 g 0   vitamin B-12 (CYANOCOBALAMIN) 1000 MCG tablet Take 1,000 mcg by mouth every 7 (seven) days.     No current facility-administered medications for this visit.     Musculoskeletal: Strength &  Muscle Tone:  Unable to assess due to video visit [place]:  Unable to assess due to video visit Patient leans: N/A  Psychiatric Specialty Exam: Review of Systems  There were no vitals taken for this visit.There is no height or weight on file to  calculate BMI.  General Appearance:  Casualt  Eye Contact:   fair  Speech:  Clear and Coherent and Normal Rate  Volume:  Normal  Mood:  Euthymic  Affect:  Appropriate and Congruent  Thought Process:  Coherent, Goal Directed and Linear  Orientation:  Full (Time, Place, and Person)  Thought Content: WDL and Logical   Suicidal Thoughts:  No  Homicidal Thoughts:  No  Memory:  Immediate;   Good Recent;   Good Remote;   Good  Judgement:  Good  Insight:  Good  Psychomotor Activity:  normal  Concentration:  Concentration: Good and Attention Span: Good  Recall:  Good  Fund of Knowledge: Good  Language: Good  Akathisia: Not able to fully assess due to virtual visit  Handed:  Right  AIMS (if indicated):Not done  Assets:  Communication Skills Desire for Improvement Financial Resources/Insurance Housing Social Support  ADL's:  Intact  Cognition: WNL  Sleep:  Good   Screenings: GAD-7    Flowsheet Row Video Visit from 09/02/2021 in [facility] Video Visit from 03/11/2021 in [facility] Video Visit from 07/08/2020 in [facility] Video Visit from 04/20/2020 in [facility]  Total GAD-7 Score '6 7 8 7      '$ PHQ2-9    Flowsheet Row Video Visit from 09/02/2021 in [facility] Video Visit from 03/11/2021 in [facility] Video Visit from 07/08/2020 in [facility] Video Visit from 04/20/2020 in [facility] Counselor from 09/17/2019 in [facility]  PHQ-2 Total Score 0 0 1 0 2  PHQ-9 Total Score '2 3 9 6 7      '$ Flowsheet Row ED to Hosp-Admission (Discharged) from 12/22/2021 in [facility] ED from 12/12/2021 in [place] ED to Hosp-Admission (Discharged) from 07/12/2021 in [place] CATEGORY No Risk No Risk No Risk        Assessment and Plan: Patient notes that she is doing well on her current medication regimen.  No other medication changes made today.  Patient agreeable to continue medications as prescribed. 1. Anxiety  Continue- citalopram (CELEXA) 10 MG tablet; Take 0.5 tablets (5 mg total) by mouth daily. Patient will cut pill in half. She has a traumatic brain injury and will take half the dose.  Dispense: 15 tablet; Refill: 2  2. Mild episode of recurrent major depressive disorder (HCC)  Continue- citalopram (CELEXA) 10 MG tablet; Take 0.5 tablets (5 mg total) by mouth daily. Patient will cut pill in half. She has a traumatic brain injury and will take half the dose.  Dispense: 15 tablet; Refill: 2     Follow-up in 2 months Follow-up with therapy  Armando Reichert, MD 01/11/2022, 1:41 PM

## 2022-01-18 ENCOUNTER — Encounter (HOSPITAL_COMMUNITY): Payer: Self-pay | Admitting: Psychiatry

## 2022-02-01 ENCOUNTER — Other Ambulatory Visit: Payer: Self-pay

## 2022-02-01 ENCOUNTER — Other Ambulatory Visit: Payer: Self-pay | Admitting: Family Medicine

## 2022-02-01 MED ORDER — EPINEPHRINE 0.3 MG/0.3ML IJ SOAJ: INTRAMUSCULAR | 1 refills | Status: DC

## 2022-03-10 ENCOUNTER — Encounter: Payer: Self-pay | Admitting: Podiatry

## 2022-03-10 ENCOUNTER — Ambulatory Visit: Payer: Medicaid Other | Admitting: Podiatry

## 2022-03-10 DIAGNOSIS — L600 Ingrowing nail: Secondary | ICD-10-CM | POA: Diagnosis not present

## 2022-03-10 DIAGNOSIS — E119 Type 2 diabetes mellitus without complications: Secondary | ICD-10-CM

## 2022-03-10 NOTE — Progress Notes (Signed)
  Subjective:  Patient ID: Tracey Perkins, female    DOB: [birth year],  MRN: [id number]  Chief Complaint  Patient presents with   Diabetes    np diabetic with right great toenail ingrown    37 y.o. female presents with the above complaint. History confirmed with patient.  She has type 1 diabetes that is uncontrolled.  8.8 A1c.  Nails are mostly painful at the medial distal borders   Objective:  Physical Exam: warm, good capillary refill, no trophic changes or ulcerative lesions, normal DP and PT pulses, normal sensory exam, and incurvated medial border bilateral hallux no signs of infection.  Assessment:   1. Ingrowing right great toenail   2. Ingrowing left great toenail   3. Encounter for diabetic foot exam [facility])      Plan:  Patient was evaluated and treated and all questions answered.  Patient educated on diabetes. Discussed proper diabetic foot care and discussed risks and complications of disease. Educated patient in depth on reasons to return to the office immediately should he/she discover anything concerning or new on the feet. All questions answered. Discussed proper shoes as well.   Discussed the nature of ingrowing nails and nail dystrophy, do not see any evidence of clinical onychomycosis.  Debrided the nails in a slant back fashion to alleviate the offending borders and this was tolerated well and offered pain relief.  If not improving could consider partial matricectomy, would not want to do permanent matricectomy until A1c has improved to below 7.5%.  Return if symptoms worsen or fail to improve.

## 2022-03-10 NOTE — Patient Instructions (Signed)
Soak Instructions     Place 1/4 cup of epsom salts (or betadine, or white vinegar) in a quart of warm tap water.  Submerge your foot or feet with outer bandage intact for the initial soak; this will allow the bandage to become moist and wet for easy lift off.  Once you remove your bandage, continue to soak in the solution for 20 minutes.  This soak should be done twice a day.  Next, remove your foot or feet from solution, blot dry the affected area and apply a moisturizing lotion with vitamin E or aloe

## 2022-03-27 ENCOUNTER — Encounter (HOSPITAL_BASED_OUTPATIENT_CLINIC_OR_DEPARTMENT_OTHER): Payer: Self-pay | Admitting: *Deleted

## 2022-03-27 ENCOUNTER — Emergency Department (HOSPITAL_BASED_OUTPATIENT_CLINIC_OR_DEPARTMENT_OTHER)
Admission: EM | Admit: 2022-03-27 | Discharge: 2022-03-27 | Disposition: A | Discharge status: Home / Self Care | Payer: Medicaid Other | Attending: Emergency Medicine | Admitting: Emergency Medicine

## 2022-03-27 ENCOUNTER — Other Ambulatory Visit: Payer: Self-pay

## 2022-03-27 DIAGNOSIS — R109 Unspecified abdominal pain: Secondary | ICD-10-CM | POA: Diagnosis present

## 2022-03-27 DIAGNOSIS — E1065 Type 1 diabetes mellitus with hyperglycemia: Secondary | ICD-10-CM | POA: Diagnosis not present

## 2022-03-27 DIAGNOSIS — Z794 Long term (current) use of insulin: Secondary | ICD-10-CM | POA: Insufficient documentation

## 2022-03-27 DIAGNOSIS — Z20822 Contact with and (suspected) exposure to covid-19: Secondary | ICD-10-CM | POA: Diagnosis not present

## 2022-03-27 DIAGNOSIS — Z7982 Long term (current) use of aspirin: Secondary | ICD-10-CM | POA: Insufficient documentation

## 2022-03-27 DIAGNOSIS — R000 Tachycardia, unspecified: Secondary | ICD-10-CM | POA: Diagnosis not present

## 2022-03-27 DIAGNOSIS — K3184 Gastroparesis: Secondary | ICD-10-CM | POA: Insufficient documentation

## 2022-03-27 DIAGNOSIS — E1043 Type 1 diabetes mellitus with diabetic autonomic (poly)neuropathy: Secondary | ICD-10-CM | POA: Diagnosis not present

## 2022-03-27 LAB — I-STAT VENOUS BLOOD GAS, ED
Acid-Base Excess: 3 mmol/L — ABNORMAL HIGH (ref 0.0–2.0)
Bicarbonate: 31.4 mmol/L — ABNORMAL HIGH (ref 20.0–28.0)
Calcium, Ion: 1.19 mmol/L (ref 1.15–1.40)
HCT: 42 % (ref 36.0–46.0)
Hemoglobin: 14.3 g/dL (ref 12.0–15.0)
O2 Saturation: 27 %
Patient temperature: 98.8
Potassium: 3.3 mmol/L — ABNORMAL LOW (ref 3.5–5.1)
Sodium: 141 mmol/L (ref 135–145)
TCO2: 33 mmol/L — ABNORMAL HIGH (ref 22–32)
pCO2, Ven: 66 mmHg — ABNORMAL HIGH (ref 44–60)
pH, Ven: 7.286 (ref 7.25–7.43)
pO2, Ven: 21 mmHg — CL (ref 32–45)

## 2022-03-27 LAB — CBG MONITORING, ED
Glucose-Capillary: 72 mg/dL (ref 70–99)
Glucose-Capillary: 155 mg/dL — ABNORMAL HIGH (ref 70–99)
Glucose-Capillary: 165 mg/dL — ABNORMAL HIGH (ref 70–99)

## 2022-03-27 LAB — CBC WITH DIFFERENTIAL/PLATELET
Abs Immature Granulocytes: 0.06 10*3/uL (ref 0.00–0.07)
Basophils Absolute: 0.1 10*3/uL (ref 0.0–0.1)
Basophils Relative: 0 %
Eosinophils Absolute: 0.1 10*3/uL (ref 0.0–0.5)
Eosinophils Relative: 1 %
HCT: 41.4 % (ref 36.0–46.0)
Hemoglobin: 13.7 g/dL (ref 12.0–15.0)
Immature Granulocytes: 0 %
Lymphocytes Relative: 16 %
Lymphs Abs: 2.3 10*3/uL (ref 0.7–4.0)
MCH: 30 pg (ref 26.0–34.0)
MCHC: 33.1 g/dL (ref 30.0–36.0)
MCV: 90.6 fL (ref 80.0–100.0)
Monocytes Absolute: 1 10*3/uL (ref 0.1–1.0)
Monocytes Relative: 7 %
Neutro Abs: 10.8 10*3/uL — ABNORMAL HIGH (ref 1.7–7.7)
Neutrophils Relative %: 76 %
Platelets: 452 10*3/uL — ABNORMAL HIGH (ref 150–400)
RBC: 4.57 MIL/uL (ref 3.87–5.11)
RDW: 14.5 % (ref 11.5–15.5)
WBC: 14.3 10*3/uL — ABNORMAL HIGH (ref 4.0–10.5)
nRBC: 0 % (ref 0.0–0.2)

## 2022-03-27 LAB — COMPREHENSIVE METABOLIC PANEL
ALT: 15 U/L (ref 0–44)
AST: 20 U/L (ref 15–41)
Albumin: 4 g/dL (ref 3.5–5.0)
Alkaline Phosphatase: 109 U/L (ref 38–126)
Anion gap: 8 (ref 5–15)
BUN: 12 mg/dL (ref 6–20)
CO2: 28 mmol/L (ref 22–32)
Calcium: 8.7 mg/dL — ABNORMAL LOW (ref 8.9–10.3)
Chloride: 102 mmol/L (ref 98–111)
Creatinine, Ser: 1.06 mg/dL — ABNORMAL HIGH (ref 0.44–1.00)
GFR, Estimated: >60 mL/min (ref >60)
Glucose, Bld: 93 mg/dL (ref 70–99)
Potassium: 3.4 mmol/L — ABNORMAL LOW (ref 3.5–5.1)
Sodium: 138 mmol/L (ref 135–145)
Total Bilirubin: 0.4 mg/dL (ref 0.3–1.2)
Total Protein: 7.8 g/dL (ref 6.5–8.1)

## 2022-03-27 LAB — HCG, SERUM, QUALITATIVE: Preg, Serum: NEGATIVE

## 2022-03-27 LAB — URINALYSIS, ROUTINE W REFLEX MICROSCOPIC
Bilirubin Urine: NEGATIVE
Glucose, UA: 100 mg/dL — AB
Ketones, ur: >=80 mg/dL — AB
Leukocytes,Ua: NEGATIVE
Nitrite: POSITIVE — AB
Protein, ur: NEGATIVE mg/dL
Specific Gravity, Urine: 1.02 (ref 1.005–1.030)
pH: 7 (ref 5.0–8.0)

## 2022-03-27 LAB — URINALYSIS, MICROSCOPIC (REFLEX)

## 2022-03-27 LAB — RESP PANEL BY RT-PCR (RSV, FLU A&B, COVID)  RVPGX2
Influenza A by PCR: NEGATIVE
Influenza B by PCR: NEGATIVE
Resp Syncytial Virus by PCR: NEGATIVE
SARS Coronavirus 2 by RT PCR: NEGATIVE

## 2022-03-27 LAB — BETA-HYDROXYBUTYRIC ACID: Beta-Hydroxybutyric Acid: 0.1 mmol/L (ref 0.05–0.27)

## 2022-03-27 LAB — LIPASE, BLOOD: Lipase: 36 U/L (ref 11–51)

## 2022-03-27 LAB — MAGNESIUM: Magnesium: 1.8 mg/dL (ref 1.7–2.4)

## 2022-03-27 MED ORDER — SODIUM CHLORIDE 0.9 % IV BOLUS
500.0000 mL | Freq: Once | INTRAVENOUS | Status: AC
  Administered 2022-03-27: 500 mL via INTRAVENOUS

## 2022-03-27 MED ORDER — SODIUM CHLORIDE 0.9 % IV BOLUS
1000.0000 mL | Freq: Once | INTRAVENOUS | Status: AC
  Administered 2022-03-27: 1000 mL via INTRAVENOUS

## 2022-03-27 MED ORDER — METOCLOPRAMIDE HCL 5 MG PO TABS: 5.0000 mg | ORAL_TABLET | Freq: Four times a day (QID) | ORAL | 0 refills | Status: DC | PRN

## 2022-03-27 MED ORDER — ONDANSETRON HCL 4 MG/2ML IJ SOLN
4.0000 mg | Freq: Once | INTRAMUSCULAR | Status: AC
  Administered 2022-03-27: 4 mg via INTRAVENOUS
  Filled 2022-03-27: qty 2

## 2022-03-27 NOTE — ED Notes (Signed)
Up to b/r with father, slow cautious steady gait, attempting urine sample.

## 2022-03-27 NOTE — ED Provider Notes (Signed)
[facility] Provider Note   CSN: [id number] Arrival date & time: 03/27/22  1352     History  Chief Complaint  Patient presents with   Abdominal Pain    Tracey Perkins is a 37 y.o. female history of type 1 diabetes, gastroparesis, stroke presented with nausea, vomiting, diarrhea that began this morning.  Patient states this feels similar to previous gastroparesis episodes and that she has "smelly burps."  Patient states she has been unable to take in food or fluids today.  Patient states when she is seen for this she usually receives fluids and Zofran and symptoms resolved.  Patient states sugar was in the 70s and 80s this morning.   Patient chest pain, shortness of breath, syncope, headache, vision changes, changes in sensation/motor skills, dysuria, fevers, chills  Home Medications Prior to Admission medications   Medication Sig Start Date End Date Taking? Authorizing Provider  acetaminophen (TYLENOL) 500 MG tablet Take 500 mg by mouth every 6 (six) hours as needed for mild pain or headache.    [provider]  aspirin EC 81 MG tablet Take 81 mg by mouth daily. Swallow whole.    [provider]  Azelastine HCl 137 MCG/SPRAY SOLN PLACE 2 SPRAYS INTO BOTH NOSTRILS 2 (TWO) TIMES DAILY Patient taking differently: Place 2 sprays into both nostrils 2 (two) times daily as needed (allergies & congestion). 06/09/21   Dara Hoyer, FNP  citalopram (CELEXA) 10 MG tablet Take 0.5 tablets (5 mg total) by mouth at bedtime. 01/11/22   Armando Reichert, MD  Continuous Blood Gluc Sensor (DEXCOM G6 SENSOR) MISC Apply 1 Device topically See admin instructions. Place 1 sensor into the skin every 10 days 06/03/21   [provider]  Continuous Blood Gluc Transmit (DEXCOM G6 TRANSMITTER) MISC USE 1 EACH EVERY 3 (THREE) MONTHS 01/26/21   [provider]  DERMOTIC 0.01 % OIL Place 4 drops into both ears See admin instructions. Place 4  drops into both ears 2 times a week as needed/as directed 05/15/20   [provider]  EPINEPHRINE 0.3 mg/0.3 mL IJ SOAJ injection Use as directed 02/01/22   Ambs, Kathrine Cords, FNP  FLORASTOR 250 MG capsule Take 250 mg by mouth in the morning.    [provider]  fluconazole (DIFLUCAN) 150 MG tablet Take 150 mg by mouth as needed (as directed, for yeast infections). 05/21/21   [provider]  fluticasone (FLONASE) 50 MCG/ACT nasal spray 2 sprays per nostril daily as needed for stuffy nose. Patient taking differently: Place 2 sprays into both nostrils daily. 06/09/21   Dara Hoyer, FNP  fluticasone (FLOVENT HFA) 110 MCG/ACT inhaler Inhale 2 puffs into the lungs 2 (two) times daily as needed (for asthma flares, until cough and wheeze-free). 07/14/21   Aline August, MD  gabapentin (NEURONTIN) 300 MG capsule Take 300-600 mg by mouth See admin instructions. Take 300 mg by mouth in the morning and 600 mg at bedtime 11/13/19 06/03/22  [provider]  Glucagon 3 MG/DOSE POWD Place 3 mg into the nose as needed (low blood sugar).    [provider]  insulin aspart (NOVOLOG) 100 UNIT/ML injection Inject into the skin See admin instructions. Per pump 12/24/19   [provider]  Insulin Disposable Pump (OMNIPOD 5 G6 POD, GEN 5,) MISC Inject 1 Device into the skin See admin instructions. Place 1 pod into the skin every 2-3 days 06/04/21   [provider]  LANTUS  SOLOSTAR 100 UNIT/ML Solostar Pen SMARTSIG:36 SUB-Q Daily    [provider]  levothyroxine (SYNTHROID) 75 MCG tablet Take 75 mcg by mouth daily before breakfast. 04/05/21   [provider]  loratadine (CLARITIN) 10 MG tablet Take 1 tablet (10 mg total) by mouth daily as needed for allergies. Patient taking differently: Take 10 mg by mouth in the morning. 06/09/21   Ambs, Kathrine Cords, FNP  lubiprostone (AMITIZA) 8 MCG capsule Take 16 mcg by mouth daily with breakfast.     [provider]  magnesium oxide (MAG-OX) 400 (240 Mg) MG tablet Take 800 mg by mouth at bedtime.    [provider]  meclizine (ANTIVERT) 25 MG tablet as needed. 06/19/19   [provider]  metoCLOPramide (REGLAN) 5 MG tablet Take 1 tablet (5 mg total) by mouth every 6 (six) hours as needed for nausea. 03/27/22 04/26/22  Chuck Hint, PA-C  montelukast (SINGULAIR) 10 MG tablet TAKE 1 TABLET (10 MG TOTAL) BY MOUTH AT BEDTIME. TO PREVENT COUGHING OR WHEEZING 01/12/22   Ambs, Kathrine Cords, FNP  Omega-3 Fatty Acids (FISH OIL PO) Take 1 capsule by mouth daily with supper.    [provider]  ondansetron (ZOFRAN) 4 MG tablet Take 1 tablet (4 mg total) by mouth every 8 (eight) hours as needed for nausea or vomiting. 07/14/21   Aline August, MD  pantoprazole (PROTONIX) 20 MG tablet Take 1 tablet (20 mg total) by mouth daily. Patient taking differently: Take 20 mg by mouth daily as needed for heartburn or indigestion. 06/09/21 06/09/22  Dara Hoyer, FNP  Polyethyl Glycol-Propyl Glycol (SYSTANE OP) Place 1 drop into both eyes 3 (three) times daily as needed (for dryness).    [provider]  potassium chloride (KLOR-CON) 10 MEQ tablet Take 10 mEq by mouth daily. 03/23/21   [provider]  pravastatin (PRAVACHOL) 20 MG tablet Take 20 mg by mouth daily after supper.    [provider]  PROAIR HFA 108 [phone] Base) MCG/ACT inhaler Inhale 2 puffs into the lungs every 4 (four) hours as needed for wheezing or shortness of breath. TAKE 2 PUFFS BY MOUTH EVERY 4 HOURS AS NEEDED Strength: 108 (90 Base) MCG/ACT Patient taking differently: Inhale 2 puffs into the lungs every 4 (four) hours as needed for wheezing or shortness of breath. 06/09/21   Ambs, Kathrine Cords, FNP  vitamin B-12 (CYANOCOBALAMIN) 1000 MCG tablet Take 1,000 mcg by mouth every 7 (seven) days.    [provider]      Allergies    Banana, Bactrim [sulfamethoxazole-trimethoprim], Doxycycline, Gentian violet, Gentian  violet-proflavine sulfate [triple dye], Mold extract [trichophyton], Morphine and related, Other, and Vancomycin    Review of Systems   Review of Systems  Gastrointestinal:  Positive for abdominal pain.  See HPI  Physical Exam Updated Vital Signs BP 127/86   Pulse (!) 118   Temp 98.7 F (37.1 C) (Oral)   Resp 14   Wt 88.5 kg   LMP  (LMP Unknown) Comment: haven't had one in a long time d/t previous chemo  SpO2 95%   BMI 34.54 kg/m  Physical Exam Vitals reviewed.  Constitutional:      General: She is not in acute distress. HENT:     Head: Normocephalic and atraumatic.  Eyes:     Extraocular Movements: Extraocular movements intact.     Conjunctiva/sclera: Conjunctivae normal.     Pupils: Pupils are equal, round, and reactive to light.  Cardiovascular:  Rate and Rhythm: Regular rhythm. Tachycardia present.     Pulses: Normal pulses.     Heart sounds: Normal heart sounds.     Comments: 2+ bilateral radial/dorsalis pedis pulses with regular rate Pulmonary:     Effort: Pulmonary effort is normal. No respiratory distress.     Breath sounds: Normal breath sounds.  Abdominal:     Palpations: Abdomen is soft.     Tenderness: There is no abdominal tenderness. There is no guarding or rebound.  Musculoskeletal:        General: Normal range of motion.     Cervical back: Normal range of motion and neck supple.     Comments: 5 out of 5 bilateral grip/leg extension strength  Skin:    General: Skin is warm and dry.     Capillary Refill: Capillary refill takes less than 2 seconds.  Neurological:     General: No focal deficit present.     Mental Status: She is alert and oriented to person, place, and time.     Comments: Sensation intact in all 4 limbs  Psychiatric:        Mood and Affect: Mood normal.     ED Results / Procedures / Treatments   Labs (all labs ordered are listed, but only abnormal results are displayed) Labs Reviewed  COMPREHENSIVE METABOLIC PANEL - Abnormal;  Notable for the following components:      Result Value   Potassium 3.4 (*)    Creatinine, Ser 1.06 (*)    Calcium 8.7 (*)    All other components within normal limits  URINALYSIS, ROUTINE W REFLEX MICROSCOPIC - Abnormal; Notable for the following components:   APPearance HAZY (*)    Glucose, UA 100 (*)    Hgb urine dipstick LARGE (*)    Ketones, ur >=80 (*)    Nitrite POSITIVE (*)    All other components within normal limits  CBC WITH DIFFERENTIAL/PLATELET - Abnormal; Notable for the following components:   WBC 14.3 (*)    Platelets 452 (*)    Neutro Abs 10.8 (*)    All other components within normal limits  URINALYSIS, MICROSCOPIC (REFLEX) - Abnormal; Notable for the following components:   Bacteria, UA RARE (*)    All other components within normal limits  I-STAT VENOUS BLOOD GAS, ED - Abnormal; Notable for the following components:   pCO2, Ven 66.0 (*)    pO2, Ven 21 (*)    Bicarbonate 31.4 (*)    TCO2 33 (*)    Acid-Base Excess 3.0 (*)    Potassium 3.3 (*)    All other components within normal limits  CBG MONITORING, ED - Abnormal; Notable for the following components:   Glucose-Capillary 155 (*)    All other components within normal limits  CBG MONITORING, ED - Abnormal; Notable for the following components:   Glucose-Capillary 165 (*)    All other components within normal limits  RESP PANEL BY RT-PCR (RSV, FLU A&B, COVID)  RVPGX2  LIPASE, BLOOD  HCG, SERUM, QUALITATIVE  BETA-HYDROXYBUTYRIC ACID  MAGNESIUM  CBG MONITORING, ED    EKG None  Radiology No results found.  Procedures Procedures    Medications Ordered in ED Medications  ondansetron (ZOFRAN) injection 4 mg (4 mg Intravenous Given 03/27/22 1444)  sodium chloride 0.9 % bolus 500 mL (0 mLs Intravenous Stopped 03/27/22 1639)  sodium chloride 0.9 % bolus 1,000 mL (0 mLs Intravenous Stopped 03/27/22 1805)    ED Course/ Medical Decision Making/ A&P  Medical Decision  Making Amount and/or Complexity of Data Reviewed Labs: ordered.  Risk Prescription drug management.   Tracey Perkins 37 y.o. presented today for N/V/D. Working DDx that I considered at this time includes, but not limited to, URI, gastroparesis, DKA, HHS.  R/o DDx: DKA: Patient does not have an anion gap and is not acidotic HHS: Sugars are not elevated to her this would be consistent URI: Respiratory panel negative  Review of prior external notes: 12/22/2021 ED to hospital admission  Unique Tests and My Interpretation:  CMP: Unremarkable I-STAT VBG: Increased CO2 66 Respiratory panel: Negative Magnesium: Unremarkable Lipase: Unremarkable hCG serum qualitative: Negative CBC: Leukocytosis 14.3  Discussion with Independent Historian: Dad  Discussion of Management of Tests: None  Risk:    Medium:  - prescription drug management  Risk Stratification Score: None  Staffed with Lavenia Atlas, DO  Plan: Patient presented for nausea vomiting and diarrhea not in any distress and was slightly tachycardic at 105 but otherwise stable vitals.  Patient unremarkable physical exam.  Labs will be ordered along with small amount of fluid and Zofran for symptom management.  Patient will be reassessed.  Patient stable at this time.  On assessment patient was resting comfortably and did not appear to be in any distress.  Patient stated she felt the Zofran is helping however her dad noted that on his phone patient triggered his jump from the 70s 2 hours ago to 203 at 1630.  A repeat CBG will be ordered and if the sugar is high patient will receive more fluid.  I suspect the patient's sugars increasing due to a potential infection due to an increased white count.  Patient's repeat CBG was 155 which is an increase from 72 that was done 2 hours ago.  Patient received more fluid and be reevaluated.  On reevaluation patient is stable for discharge.  Patient does not have Reglan at home and so that  will be refilled.  Patient is Zofran at home and I encouraged her to take that as needed for the nausea and if that does not work try the [place].  I encouraged patient follow-up with her primary care provider.  I spoke with the patient about the importance of staying hydrated and taking in small bites of food as tolerated.  Patient was given return precautions. Patient stable for discharge at this time.  Patient verbalized understanding of plan.         Final Clinical Impression(s) / ED Diagnoses Final diagnoses:  Gastroparesis    Rx / DC Orders ED Discharge Orders          Ordered    metoCLOPramide (REGLAN) 5 MG tablet  Every 6 hours PRN        03/27/22 1955              Chuck Hint, PA-C 03/27/22 1956    Horton, Alvin Critchley, DO 03/27/22 2121

## 2022-03-27 NOTE — Discharge Instructions (Signed)
Please make an appointment with your primary care provider to be seen regarding recent symptoms and ER visit.  Please remain hydrated and take in small amounts of food as tolerated.  Please continue to take your Zofran for nausea and vomiting however if that does not work you may take the Reglan I have refilled for you.  If symptoms worsen please return to ER.

## 2022-03-27 NOTE — ED Notes (Signed)
Delay d/t pt in w/r b/r, family with pt.

## 2022-03-27 NOTE — ED Triage Notes (Signed)
Here by POV from home for new onset abd discomfort/burning, NVD. Onset this am, similar to usual episodes of gastroparesis. Experiences same Q4 months. Denies fever. Endorses anxiety. H/o DM. "Has turned her pump off". BS recently was 82 and has been eating candy for lows. Father at [facility]. IVF and zofran usually helps. Sx onset upon waking this am. States, "felt better at church, but worsened afterwards and came here".

## 2022-03-27 NOTE — ED Notes (Signed)
ED PA at [facility] 

## 2022-03-27 NOTE — ED Notes (Signed)
Up to b/r, attempting urine and stool sample.

## 2022-03-27 NOTE — ED Notes (Addendum)
EDPA at [facility]. PO fluid challenge given.

## 2022-03-27 NOTE — ED Notes (Signed)
Attempted urine sample, was unable, not obtained.

## 2022-03-27 NOTE — ED Notes (Addendum)
Stool sample obtained, unable to get urine sample. Personal blood sugar reading 107 per father at [facility].

## 2022-04-04 ENCOUNTER — Other Ambulatory Visit: Payer: Self-pay | Admitting: Family Medicine

## 2022-04-06 ENCOUNTER — Other Ambulatory Visit (HOSPITAL_COMMUNITY): Payer: Self-pay | Admitting: Psychiatry

## 2022-04-06 ENCOUNTER — Other Ambulatory Visit: Payer: Self-pay | Admitting: Family Medicine

## 2022-04-06 DIAGNOSIS — F419 Anxiety disorder, unspecified: Secondary | ICD-10-CM

## 2022-04-06 DIAGNOSIS — F330 Major depressive disorder, recurrent, mild: Secondary | ICD-10-CM

## 2022-04-11 ENCOUNTER — Other Ambulatory Visit: Payer: Self-pay | Admitting: Family Medicine

## 2022-04-12 ENCOUNTER — Encounter: Payer: Self-pay | Admitting: Family Medicine

## 2022-04-12 ENCOUNTER — Other Ambulatory Visit: Payer: Self-pay

## 2022-04-12 ENCOUNTER — Ambulatory Visit (INDEPENDENT_AMBULATORY_CARE_PROVIDER_SITE_OTHER): Payer: Medicaid Other | Admitting: Family Medicine

## 2022-04-12 ENCOUNTER — Other Ambulatory Visit: Payer: Self-pay | Admitting: Family Medicine

## 2022-04-12 VITALS — BP 106/60 | HR 105 | Temp 97.7°F | Resp 17 | Ht 63.0 in | Wt 193.6 lb

## 2022-04-12 DIAGNOSIS — J4530 Mild persistent asthma, uncomplicated: Secondary | ICD-10-CM

## 2022-04-12 DIAGNOSIS — J302 Other seasonal allergic rhinitis: Secondary | ICD-10-CM

## 2022-04-12 DIAGNOSIS — T7800XD Anaphylactic reaction due to unspecified food, subsequent encounter: Secondary | ICD-10-CM

## 2022-04-12 DIAGNOSIS — H1013 Acute atopic conjunctivitis, bilateral: Secondary | ICD-10-CM

## 2022-04-12 DIAGNOSIS — K219 Gastro-esophageal reflux disease without esophagitis: Secondary | ICD-10-CM | POA: Diagnosis not present

## 2022-04-12 DIAGNOSIS — J3089 Other allergic rhinitis: Secondary | ICD-10-CM

## 2022-04-12 DIAGNOSIS — H1010 Acute atopic conjunctivitis, unspecified eye: Secondary | ICD-10-CM

## 2022-04-12 DIAGNOSIS — T7800XA Anaphylactic reaction due to unspecified food, initial encounter: Secondary | ICD-10-CM

## 2022-04-12 DIAGNOSIS — T781XXS Other adverse food reactions, not elsewhere classified, sequela: Secondary | ICD-10-CM

## 2022-04-12 MED ORDER — VENTOLIN HFA 108 (90 BASE) MCG/ACT IN AERS: 2.0000 | INHALATION_SPRAY | RESPIRATORY_TRACT | 1 refills | Status: DC | PRN

## 2022-04-12 MED ORDER — PANTOPRAZOLE SODIUM 20 MG PO TBEC: 20.0000 mg | DELAYED_RELEASE_TABLET | Freq: Every day | ORAL | 5 refills | Status: AC

## 2022-04-12 MED ORDER — MONTELUKAST SODIUM 10 MG PO TABS: 10.0000 mg | ORAL_TABLET | Freq: Every day | ORAL | 5 refills | Status: DC

## 2022-04-12 MED ORDER — EPINEPHRINE 0.3 MG/0.3ML IJ SOAJ: INTRAMUSCULAR | 1 refills | Status: DC

## 2022-04-12 MED ORDER — FLUTICASONE PROPIONATE 50 MCG/ACT NA SUSP: NASAL | 5 refills | Status: DC

## 2022-04-12 MED ORDER — LORATADINE 10 MG PO TABS: 10.0000 mg | ORAL_TABLET | Freq: Every day | ORAL | 5 refills | Status: DC | PRN

## 2022-04-12 NOTE — Progress Notes (Addendum)
[place] [address] Dept: [phone]  FOLLOW UP NOTE  Patient ID: Tracey Perkins, female    DOB: [birth year]  Age: 37 y.o. MRN: [id number] Date of Office Visit: 04/12/2022  Assessment  Chief Complaint: Follow-up  HPI Tracey Perkins is a 37 year old female who presents to the clinic for follow-up visit.  She was last seen in this clinic on 06/09/2021 by Gareth Morgan, FNP, for evaluation of asthma, allergic rhinitis, allergic conjunctivitis, reflux, and food allergy.  Her history includes type 1 diabetes, gastroparesis, hypothyroidism, medulloblastoma with resection 1999, history of severe concussion, and stroke in 2022.  She is accompanied by her mother who assists with history.  At today's visit, she reports her asthma has been moderately well-controlled with occasional shortness of breath with activity such as walking.  She denies cough or wheeze with activity or rest.  She denies shortness of breath with rest.  She also reports some shortness of breath due to anxiety for which she uses albuterol with mild relief of symptoms.  She is not currently using albuterol before activity.  Further investigation indicates that she has currently mixed up her albuterol and ICS inhaler and uses her ICS inhaler for rescue and rarely uses her albuterol.  Allergic rhinitis is reported as moderately well-controlled with occasional clear rhinorrhea, nasal congestion in the morning, sneezing, and postnasal drainage.  She continues Claritin 10 mg once a day, Flonase, and azelastine daily with relief of symptoms.  Her last environmental allergy testing was on 11/16/2015 was positive to pollens, dust mite, and mold.  Allergic conjunctivitis is reported as moderately well-controlled with occasional red and itchy eyes for which she continues to use Systane with relief of symptoms.  Reflux is reported as poorly controlled with heartburn occurring frequently.  She reports that she has recently had an  episode with gastroparesis and has stopped taking pantoprazole at that time.  She continues to avoid banana due to previous reaction with unknown food and positive testing to banana.  She is interested in retesting for banana allergy with possible food challenge in the future.  Her current medications are listed in the chart.  Drug Allergies:  Allergies  Allergen Reactions   Banana Swelling and Other (See Comments)    Mouth burning and swollen   Bactrim [Sulfamethoxazole-Trimethoprim] Hives   Doxycycline Hives   Gentian Violet Other (See Comments)    Mouth burns   Gentian Violet-Proflavine Sulfate [Triple Dye] Other (See Comments)    Mouth burns   Mold Extract [Trichophyton] Other (See Comments)    Hay Fever   Morphine And Related Hives   Other Hives, Itching and Other (See Comments)    "Hay fever, dust and pollen."  Per patient.- itching, runny nose   Vancomycin Other (See Comments)    Red man syndrome     Physical Exam: BP 106/60 (BP Location: Left Arm, Patient Position: Sitting, Cuff Size: Normal)   Pulse (!) 105   Temp 97.7 F (36.5 C) (Temporal)   Resp 17   Ht 5\' 3"  (1.6 m)   Wt 193 lb 9.6 oz (87.8 kg)   LMP  (LMP Unknown) Comment: haven't had one in a long time d/t previous chemo  SpO2 97%   BMI 34.29 kg/m    Physical Exam Vitals reviewed.  Constitutional:      Appearance: Normal appearance.  HENT:     Head: Normocephalic and atraumatic.     Right Ear: Tympanic membrane normal.  Left Ear: Tympanic membrane normal.     Nose:     Comments: Bilateral nares slightly erythematous with clear nasal drainage noted.  Pharynx normal.  Ears normal.  Eyes normal.    Mouth/Throat:     Pharynx: Oropharynx is clear.  Eyes:     Conjunctiva/sclera: Conjunctivae normal.  Cardiovascular:     Rate and Rhythm: Normal rate and regular rhythm.     Heart sounds: Normal heart sounds. No murmur heard. Pulmonary:     Effort: Pulmonary effort is normal.     Breath sounds:  Normal breath sounds.     Comments: Lungs clear to auscultation Musculoskeletal:        General: Normal range of motion.     Cervical back: Normal range of motion and neck supple.  Skin:    General: Skin is warm and dry.  Neurological:     Mental Status: She is alert and oriented to person, place, and time.  Psychiatric:        Mood and Affect: Mood normal.        Behavior: Behavior normal.        Thought Content: Thought content normal.        Judgment: Judgment normal.    Diagnostics: FVC 2.04, FEV1 1.82.  Predicted FVC 3.62, predicted FEV1 3.02.  Spirometry indicates possible restriction.  This is consistent with previous spirometry readings.  Assessment and Plan: 1. Mild persistent asthma without complication   2. Gastroesophageal reflux disease, unspecified whether esophagitis present   3. Seasonal and perennial allergic rhinitis   4. Seasonal allergic conjunctivitis   5. 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    Dispense:  16 g    Refill:  5   EPINEPHRINE 0.3 mg/0.3 mL IJ SOAJ injection    Sig: Use as directed    Dispense:  2 each    Refill:  1    Please keep rx on file. Patient will call when needed.   loratadine (CLARITIN) 10 MG tablet    Sig: Take 1 tablet (10 mg total) by mouth daily as needed for allergies.    Dispense:  30 tablet    Refill:  5   montelukast (SINGULAIR) 10 MG tablet    Sig: Take 1 tablet (10 mg total) by mouth at bedtime. To prevent coughing or wheezing    Dispense:  30 tablet    Refill:  5   pantoprazole (PROTONIX) 20 MG tablet    Sig: Take 1 tablet (20 mg total) by mouth daily.    Dispense:  30 tablet    Refill:  5    Please keep rx on file. Patient will call when needed.    Patient Instructions  Asthma Continue montelukast  10 mg once a day for  coughing or wheezing Continue albuterol (ProAir, red inhaler) 2 puffs every 4 hours if needed for wheezing or coughing spells For asthma flares, begin Flovent 110 (orange inhaler)-2 puffs twice a day with a spacer for 2 weeks or until you are cough and wheeze free. Rinse your mouth after each use  Allergic rhinitis Continue Claritin 10 mg once a day if needed for runny nose or itchy eyes.  Continue fluticasone 1 spray per nostril twice a day if needed for stuffy nose. This  may help your ears.  In the right nostril, point the applicator out toward the right ear. In the left nostril, point the applicator out toward the left ear Astelin nasal spray 2 sprays in each nostril twice a day as needed for runny nose or sneezing Continue saline nasal rinses as needed for nasal symptoms. Use this before any medicated nasal sprays for best result Continue environmental control of pollens, dust mite, and mold as listed below Consider allergen immunotherapy if medications are not effective in controlling your symptoms  Allergic conjunctivitis Continue a daily lubricating eye drop such as Natural Tears or Systane as needed Some over the counter eye drops include Pataday one drop in each eye once a day as needed for red, itchy eyes OR Zaditor one drop in each eye twice a day as needed for red itchy eyes.  Reflux Restart pantoprazole as previously prescribed Follow up with your Gastroenterologist as you normally do  Adverse food reaction Continue to avoid banana. In case of an allergic reaction, take Benadryl 50 mg every 4 hours, and if life-threatening symptoms occur, inject with EpiPen 0.3 mg. A lab order has been placed to help [place] evaluate your banana allergy. We will call you when the result becomes available  Continue other medications as noted in your chart.  Call me if this treatment plan is not working well for you  Follow up in the clinic in 6 months or sooner as needed.   Return in about 6  months (around 10/13/2022), or if symptoms worsen or fail to improve.    Thank you for the opportunity to care for this patient.  Please do not hesitate to contact me with questions.  Gareth Morgan, FNP [facility] [place] [facility] [place]

## 2022-04-12 NOTE — Patient Instructions (Signed)
Asthma Continue montelukast  10 mg once a day for coughing or wheezing Continue albuterol (ProAir, red inhaler) 2 puffs every 4 hours if needed for wheezing or coughing spells For asthma flares, begin Flovent 110 (orange inhaler)-2 puffs twice a day with a spacer for 2 weeks or until you are cough and wheeze free. Rinse your mouth after each use  Allergic rhinitis Continue Claritin 10 mg once a day if needed for runny nose or itchy eyes.  Continue fluticasone 1 spray per nostril twice a day if needed for stuffy nose. This may help your ears.  In the right nostril, point the applicator out toward the right ear. In the left nostril, point the applicator out toward the left ear Astelin nasal spray 2 sprays in each nostril twice a day as needed for runny nose or sneezing Continue saline nasal rinses as needed for nasal symptoms. Use this before any medicated nasal sprays for best result Continue environmental control of pollens, dust mite, and mold as listed below Consider allergen immunotherapy if medications are not effective in controlling your symptoms  Allergic conjunctivitis Continue a daily lubricating eye drop such as Natural Tears or Systane as needed Some over the counter eye drops include Pataday one drop in each eye once a day as needed for red, itchy eyes OR Zaditor one drop in each eye twice a day as needed for red itchy eyes.  Reflux Restart pantoprazole as previously prescribed Follow up with your Gastroenterologist as you normally do  Adverse food reaction Continue to avoid banana. In case of an allergic reaction, take Benadryl 50 mg every 4 hours, and if life-threatening symptoms occur, inject with EpiPen 0.3 mg. A lab order has been placed to help [place] evaluate your banana allergy. We will call you when the result becomes available  Continue other medications as noted in your chart.  Call me if this treatment plan is not working well for you  Follow up in the clinic in 6  months or sooner as needed.  Reducing Pollen Exposure The American Academy of Allergy, Asthma and Immunology suggests the following steps to reduce your exposure to pollen during allergy seasons. Do not hang sheets or clothing out to dry; pollen may collect on these items. Do not mow lawns or spend time around freshly cut grass; mowing stirs up pollen. Keep windows closed at night.  Keep car windows closed while driving. Minimize morning activities outdoors, a time when pollen counts are usually at their highest. Stay indoors as much as possible when pollen counts or humidity is high and on windy days when pollen tends to remain in the air longer. Use air conditioning when possible.  Many air conditioners have filters that trap the pollen spores. Use a HEPA room air filter to remove pollen form the indoor air you breathe.   Control of Dust Mite Allergen Dust mites play a major role in allergic asthma and rhinitis. They occur in environments with high humidity wherever human skin is found. Dust mites absorb humidity from the atmosphere (ie, they do not drink) and feed on organic matter (including shed human and animal skin). Dust mites are a microscopic type of insect that you cannot see with the naked eye. High levels of dust mites have been detected from mattresses, pillows, carpets, upholstered furniture, bed covers, clothes, soft toys and any woven material. The principal allergen of the dust mite is found in its feces. A gram of dust may contain 1,000 mites and 250,000 fecal particles.  Mite antigen is easily measured in the air during house cleaning activities. Dust mites do not bite and do not cause harm to humans, other than by triggering allergies/asthma.  Ways to decrease your exposure to dust mites in your home:  1. Encase mattresses, box springs and pillows with a mite-impermeable barrier or cover  2. Wash sheets, blankets and drapes weekly in hot water (130 F) with detergent and dry  them in a dryer on the hot setting.  3. Have the room cleaned frequently with a vacuum cleaner and a damp dust-mop. For carpeting or rugs, vacuuming with a vacuum cleaner equipped with a high-efficiency particulate air (HEPA) filter. The dust mite allergic individual should not be in a room which is being cleaned and should wait 1 hour after cleaning before going into the room.  4. Do not sleep on upholstered furniture (eg, couches).  5. If possible removing carpeting, upholstered furniture and drapery from the home is ideal. Horizontal blinds should be eliminated in the rooms where the person spends the most time (bedroom, study, television room). Washable vinyl, roller-type shades are optimal.  6. Remove all non-washable stuffed toys from the bedroom. Wash stuffed toys weekly like sheets and blankets above.  7. Reduce indoor humidity to less than 50%. Inexpensive humidity monitors can be purchased at most hardware stores. Do not use a humidifier as can make the problem worse and are not recommended.  Control of Mold Allergen Mold and fungi can grow on a variety of surfaces provided certain temperature and moisture conditions exist.  Outdoor molds grow on plants, decaying vegetation and soil.  The major outdoor mold, Alternaria and Cladosporium, are found in very high numbers during hot and dry conditions.  Generally, a late Summer - Fall peak is seen for common outdoor fungal spores.  Rain will temporarily lower outdoor mold spore count, but counts rise rapidly when the rainy period ends.  The most important indoor molds are Aspergillus and Penicillium.  Dark, humid and poorly ventilated basements are ideal sites for mold growth.  The next most common sites of mold growth are the bathroom and the kitchen.  Outdoor [employer] Use air conditioning and keep windows closed Avoid exposure to decaying vegetation. Avoid leaf raking. Avoid grain handling. Consider wearing a face mask if working in  moldy areas.  Indoor Mold Control Maintain humidity below 50%. Clean washable surfaces with 5% bleach solution. Remove sources e.g. Contaminated carpets.

## 2022-05-12 ENCOUNTER — Institutional Professional Consult (permissible substitution): Payer: Medicaid Other | Admitting: Neurology

## 2022-06-24 ENCOUNTER — Other Ambulatory Visit: Payer: Self-pay | Admitting: Family Medicine

## 2022-06-24 NOTE — Telephone Encounter (Signed)
Patanase 2 sprays in each nostril up to twice a day please.

## 2022-06-27 ENCOUNTER — Encounter: Payer: Self-pay | Admitting: Neurology

## 2022-06-27 ENCOUNTER — Ambulatory Visit (INDEPENDENT_AMBULATORY_CARE_PROVIDER_SITE_OTHER): Payer: Medicaid Other | Admitting: Neurology

## 2022-06-27 VITALS — BP 122/82 | HR 93 | Ht 63.0 in | Wt 194.8 lb

## 2022-06-27 DIAGNOSIS — R519 Headache, unspecified: Secondary | ICD-10-CM

## 2022-06-27 DIAGNOSIS — R635 Abnormal weight gain: Secondary | ICD-10-CM

## 2022-06-27 DIAGNOSIS — G4719 Other hypersomnia: Secondary | ICD-10-CM

## 2022-06-27 DIAGNOSIS — R0683 Snoring: Secondary | ICD-10-CM

## 2022-06-27 DIAGNOSIS — Z9189 Other specified personal risk factors, not elsewhere classified: Secondary | ICD-10-CM

## 2022-06-27 DIAGNOSIS — Z820 Family history of epilepsy and other diseases of the nervous system: Secondary | ICD-10-CM

## 2022-06-27 DIAGNOSIS — R0681 Apnea, not elsewhere classified: Secondary | ICD-10-CM | POA: Diagnosis not present

## 2022-06-27 DIAGNOSIS — E66811 Obesity, class 1: Secondary | ICD-10-CM

## 2022-06-27 DIAGNOSIS — E669 Obesity, unspecified: Secondary | ICD-10-CM

## 2022-06-27 NOTE — Patient Instructions (Signed)

## 2022-06-27 NOTE — Progress Notes (Signed)
Subjective:    Patient ID: Tracey Perkins is a 37 y.o. female.  HPI    Huston Foley, MD, PhD [facility] [address] [address] [place], [place] [address]  Dear Dr. Felipa Eth,  I saw your patient, Tracey Perkins, upon your kind request in my sleep clinic today for initial consultation of her sleep disorder, in particular, concern for underlying obstructive sleep apnea.  The patient is accompanied by her father today.  As you know, Tracey Perkins is a 37 year old female with an underlying complex medical history of type 1 diabetes, reflux disease, hypothyroidism, medulloblastoma with status post surgery in 1991, history of concussion, history of stroke in August 2022, neuropathy, followed by [facility] neurology, asthma, allergies, cognitive impairment, hearing loss, retinopathy, hyperlipidemia, and obesity, who reports snoring and excessive daytime somnolence, as well as witnessed apneas per family.  Reports a family history of sleep apnea, her dad has a CPAP machine, paternal uncle also has sleep apnea and paternal grandfather had sleep apnea as well.  She lives with her parents, she is currently pursuing her masters degree.  She does not drive.  She goes to bed around 9 but will be on the computer until about 11.  Rise time is between 10 and 11 AM.  She denies nightly nocturia but has had occasional morning headaches including migraines.  She has a family history of migraines as well.  He drinks caffeine in the form of coffee and soda, usually 1 serving each per day.  She is a non-smoker and does not drink any alcohol.  They have no pets at the house.  Her Past Medical History Is Significant For: Past Medical History:  Diagnosis Date   Asthma    Brain tumor (HCC)    Diabetes mellitus without complication (HCC)    Eczema    Food allergy    Gastroparesis    Hypokalemia    Hypomagnesemia    Hypothyroid    Neuropathy    Post concussion syndrome 05/2019   Vision changes      Her Past Surgical History Is Significant For: Past Surgical History:  Procedure Laterality Date   BRAIN SURGERY     Portacath placement and removed     WISDOM TOOTH EXTRACTION      Her Family History Is Significant For: Family History  Problem Relation Age of Onset   Thyroid disease Mother    Sleep apnea Father    Eczema Sister    Thyroid disease Sister    Thyroid disease Sister     Her Social History Is Significant For: Social History   Socioeconomic History   Marital status: Single    Spouse name: Not on file   Number of children: Not on file   Years of education: Not on file   Highest education level: Not on file  Occupational History   Not on file  Tobacco Use   Smoking status: Never   Smokeless tobacco: Never  Vaping Use   Vaping Use: Never used  Substance and Sexual Activity   Alcohol use: No   Drug use: No   Sexual activity: Yes    Birth control/protection: Pill  Other Topics Concern   Not on file  Social History Narrative   Not on file   Social Determinants of Health   Financial Resource Strain: Not on file  Food Insecurity: No Food Insecurity (12/23/2021)   Hunger Vital Sign    Worried About Running Out of Food in the Last  Year: Never true    Ran Out of Food in the Last Year: Never true  Transportation Needs: Not on file  Physical Activity: Not on file  Stress: Not on file  Social Connections: Not on file    Her Allergies Are:  Allergies  Allergen Reactions   Banana Swelling and Other (See Comments)    Mouth burning and swollen   Bactrim [Sulfamethoxazole-Trimethoprim] Hives   Doxycycline Hives   Gentian Violet Other (See Comments)    Mouth burns   Gentian Violet-Proflavine Sulfate [Triple Dye] Other (See Comments)    Mouth burns   Mold Extract [Trichophyton] Other (See Comments)    Hay Fever   Morphine And Codeine Hives   Other Hives, Itching and Other (See Comments)    "Hay fever, dust and pollen."  Per patient.- itching, runny  nose   Vancomycin Other (See Comments)    Red man syndrome   :   Her Current Medications Are:  Outpatient Encounter Medications as of 06/27/2022  Medication Sig   acetaminophen (TYLENOL) 500 MG tablet Take 500 mg by mouth as needed for mild pain or headache.   aspirin EC 81 MG tablet Take 81 mg by mouth daily. Swallow whole.   citalopram (CELEXA) 10 MG tablet Take 0.5 tablets (5 mg total) by mouth at bedtime.   Continuous Blood Gluc Sensor (DEXCOM G6 SENSOR) MISC Apply 1 Device topically See admin instructions. Place 1 sensor into the skin every 10 days Dexcom 7   Continuous Blood Gluc Transmit (DEXCOM G6 TRANSMITTER) MISC Dexcom 7   DERMOTIC 0.01 % OIL Place 4 drops into both ears See admin instructions. Place 4 drops into both ears 2 times a week as needed/as directed   EPINEPHRINE 0.3 mg/0.3 mL IJ SOAJ injection Use as directed   FLORASTOR 250 MG capsule Take 250 mg by mouth in the morning.   fluticasone (FLONASE) 50 MCG/ACT nasal spray 2 sprays per nostril daily as needed for stuffy nose.   fluticasone (FLOVENT HFA) 110 MCG/ACT inhaler Inhale 2 puffs into the lungs 2 (two) times daily as needed (for asthma flares, until cough and wheeze-free). (Patient taking differently: Inhale 2 puffs into the lungs every 30 (thirty) days.)   Glucagon 3 MG/DOSE POWD Place 3 mg into the nose as needed (low blood sugar).   insulin aspart (NOVOLOG) 100 UNIT/ML injection Inject into the skin See admin instructions. Per pump   LANTUS SOLOSTAR 100 UNIT/ML Solostar Pen as needed (uses onlyl if her insulin pump fails).   levothyroxine (SYNTHROID) 75 MCG tablet Take 75 mcg by mouth daily before breakfast.   loratadine (CLARITIN) 10 MG tablet Take 1 tablet (10 mg total) by mouth daily as needed for allergies.   lubiprostone (AMITIZA) 8 MCG capsule Take 16 mcg by mouth daily with breakfast.    magnesium oxide (MAG-OX) 400 (240 Mg) MG tablet Take 800 mg by mouth at bedtime.   meclizine (ANTIVERT) 25 MG tablet as  needed.   montelukast (SINGULAIR) 10 MG tablet Take 1 tablet (10 mg total) by mouth at bedtime. To prevent coughing or wheezing   Olopatadine HCl 0.6 % SOLN Place 2 sprays into both nostrils 2 (two) times daily.   ondansetron (ZOFRAN) 4 MG tablet Take 1 tablet (4 mg total) by mouth every 8 (eight) hours as needed for nausea or vomiting.   pantoprazole (PROTONIX) 20 MG tablet Take 1 tablet (20 mg total) by mouth daily. (Patient taking differently: Take 20 mg by mouth as needed.)   Polyethyl  Glycol-Propyl Glycol (SYSTANE OP) Place 1 drop into both eyes 3 (three) times daily as needed (for dryness).   potassium chloride (KLOR-CON) 10 MEQ tablet Take 10 mEq by mouth daily.   pravastatin (PRAVACHOL) 20 MG tablet Take 20 mg by mouth daily after supper.   PROAIR HFA 108 (90 Base) MCG/ACT inhaler Inhale 2 puffs into the lungs every 4 (four) hours as needed for wheezing or shortness of breath. TAKE 2 PUFFS BY MOUTH EVERY 4 HOURS AS NEEDED Strength: 108 (90 Base) MCG/ACT (Patient taking differently: Inhale 2 puffs into the lungs every 4 (four) hours as needed for wheezing or shortness of breath.)   VENTOLIN HFA 108 (90 Base) MCG/ACT inhaler Inhale 2 puffs into the lungs every 4 (four) hours as needed for wheezing or shortness of breath.   vitamin B-12 (CYANOCOBALAMIN) 1000 MCG tablet Take 1,000 mcg by mouth every 7 (seven) days.   No facility-administered encounter medications on file as of 06/27/2022.  :   Review of Systems:  Out of a complete 14 point review of systems, all are reviewed and negative with the exception of these symptoms as listed below:  Review of Systems  Neurological:        Pt here for sleep consult Pt snores,headaches,fatigue Pt denies sleep study,CPAP ,hypertension    ESS:14 FSS:40    Objective:  Neurological Exam  Physical Exam Physical Examination:   Vitals:   06/27/22 1343  BP: 122/82  Pulse: 93    General Examination: The patient is a very pleasant 37 y.o.  female in no acute distress. She appears well-developed and well-nourished and well groomed.   HEENT: Normocephalic, atraumatic, pupils are equal, round and reactive to light, extraocular tracking is good without limitation to gaze excursion, mild nystagmus noted without glasses, she has a special lens on the left side of her glasses for double vision, Hearing is mildly impaired, he has bilateral hearing aids in place.  Face is symmetric with normal facial animation. Speech is slow, otherwise without dysarthria or voice tremor.  There is no lip, neck/head, jaw or voice tremor. Neck is supple with full range of passive and active motion. There are no carotid bruits on auscultation. Oropharynx exam reveals: mild mouth dryness, adequate dental hygiene and moderate airway crowding, due to small airway entry.  Mallampati class II.  Neck circumference 14-1/4 inches.  Minimal to mild overbite noted.  Tongue protrudes centrally and palate elevates symmetrically.  Chest: Clear to auscultation without wheezing, rhonchi or crackles noted.  Heart: S1+S2+0, regular and normal without murmurs, rubs or gallops noted.   Abdomen: Soft, non-tender and non-distended.  Extremities: There is no pitting edema in the distal lower extremities bilaterally.   Skin: Warm and dry without trophic changes noted.   Musculoskeletal: exam reveals no obvious joint deformities.   Neurologically:  Mental status: The patient is awake, alert and oriented in all 4 spheres. Her immediate and remote memory, attention, language skills and fund of knowledge are appropriate. There is no evidence of aphasia, agnosia, apraxia or anomia. Speech is clear with normal prosody and enunciation. Thought process is linear. Mood is normal and affect is normal.  Cranial nerves II - XII are as described above under HEENT exam.  Motor exam: Normal bulk, strength and tone is noted. There is no obvious action or resting tremor.  Fine motor skills and  coordination: grossly intact.  Cerebellar testing: No dysmetria or intention tremor. There is no truncal or gait ataxia.  Sensory exam: intact to light  touch in the upper and lower extremities.  Gait, station and balance: She stands easily. No veering to one side is noted. No leaning to one side is noted. Posture is age-appropriate and stance is narrow based. Gait shows normal stride length and normal pace. No problems turning are noted.   Assessment and Plan:  In summary, Tracey Perkins is a very pleasant 37 y.o.-year old female with an underlying complex medical history of type 1 diabetes, reflux disease, hypothyroidism, medulloblastoma with status post surgery in 1991, history of concussion, history of stroke in August 2022, neuropathy, followed by [facility] neurology, asthma, allergies, cognitive impairment, hearing loss, retinopathy, hyperlipidemia, and obesity, whose history and physical exam are concerning for sleep disordered breathing, particularly obstructive sleep apnea (OSA).  While a laboratory attended sleep study is typically considered "gold standard" for evaluation of sleep disordered breathing, we mutually agreed to pursue a home sleep test at this time.   I had a long chat with the patient and her dad about my findings and the diagnosis of sleep apnea, particularly OSA, its prognosis and treatment options. We talked about medical/conservative treatments, surgical interventions and non-pharmacological approaches for symptom control. I explained, in particular, the risks and ramifications of untreated moderate to severe OSA, especially with respect to developing cardiovascular disease down the road, including congestive heart failure (CHF), difficult to treat hypertension, cardiac arrhythmias (particularly A-fib), neurovascular complications including TIA, stroke and dementia. Even type 2 diabetes has, in part, been linked to untreated OSA. Symptoms of untreated OSA may include (but may not be  limited to) daytime sleepiness, nocturia (i.e. frequent nighttime urination), memory problems, mood irritability and suboptimally controlled or worsening mood disorder such as depression and/or anxiety, lack of energy, lack of motivation, physical discomfort, as well as recurrent headaches, especially morning or nocturnal headaches. We talked about the importance of maintaining a healthy lifestyle and striving for healthy weight. In addition, we talked about the importance of striving for and maintaining good sleep hygiene. I recommended a sleep study at this time. I outlined the differences between a laboratory attended sleep study which is considered more comprehensive and accurate over the option of a home sleep test (HST); the latter may lead to underestimation of sleep disordered breathing in some instances and does not help with diagnosing upper airway resistance syndrome and is not accurate enough to diagnose primary central sleep apnea typically. I outlined possible surgical and non-surgical treatment options of OSA, including the use of a positive airway pressure (PAP) device (i.e. CPAP, AutoPAP/APAP or BiPAP in certain circumstances), a custom-made dental device (aka oral appliance, which would require a referral to a specialist dentist or orthodontist typically, and is generally speaking not considered for patients with full dentures or edentulous state), upper airway surgical options, such as traditional UPPP (which is not considered a first-line treatment) or the Inspire device (hypoglossal nerve stimulator, which would involve a referral for consultation with an ENT surgeon, after careful selection, following inclusion criteria - also not first-line treatment). I explained the PAP treatment option to the patient in detail, as this is generally considered first-line treatment.  The patient indicated that she would be willing to try PAP therapy, if the need arises. I explained the importance of being  compliant with PAP treatment, not only for insurance purposes but primarily to improve patient's symptoms symptoms, and for the patient's long term health benefit, including to reduce Her cardiovascular risks longer-term.    We will pick up our discussion about the next  steps and treatment options after testing.  We will keep her posted as to the test results by phone call and/or MyChart messaging where possible.  We will plan to follow-up in sleep clinic accordingly as well.  I answered all their questions today and the patient and her father were in agreement.   I encouraged her to call with any interim questions, concerns, problems or updates or email [place] through MyChart.  Generally speaking, sleep test authorizations may take up to 2 weeks, sometimes less, sometimes longer, the patient is encouraged to get in touch with [place] if they do not hear back from the sleep lab staff directly within the next 2 weeks.  Thank you very much for allowing me to participate in the care of this nice patient. If I can be of any further assistance to you please do not hesitate to call me at [phone].  Sincerely,   Huston Foley, MD, PhD

## 2022-07-08 ENCOUNTER — Telehealth: Payer: Self-pay | Admitting: Neurology

## 2022-07-08 NOTE — Telephone Encounter (Signed)
[facility] pending uploaded notes

## 2022-07-11 NOTE — Telephone Encounter (Signed)
[facility] no auth req via fax form  

## 2022-08-17 ENCOUNTER — Ambulatory Visit: Payer: Medicaid Other | Admitting: Neurology

## 2022-08-17 DIAGNOSIS — R0683 Snoring: Secondary | ICD-10-CM

## 2022-08-17 DIAGNOSIS — Z9189 Other specified personal risk factors, not elsewhere classified: Secondary | ICD-10-CM

## 2022-08-17 DIAGNOSIS — R0681 Apnea, not elsewhere classified: Secondary | ICD-10-CM

## 2022-08-17 DIAGNOSIS — E669 Obesity, unspecified: Secondary | ICD-10-CM

## 2022-08-17 DIAGNOSIS — G4733 Obstructive sleep apnea (adult) (pediatric): Secondary | ICD-10-CM | POA: Diagnosis not present

## 2022-08-17 DIAGNOSIS — E66811 Obesity, class 1: Secondary | ICD-10-CM

## 2022-08-17 DIAGNOSIS — G4719 Other hypersomnia: Secondary | ICD-10-CM

## 2022-08-17 DIAGNOSIS — R519 Headache, unspecified: Secondary | ICD-10-CM

## 2022-08-17 DIAGNOSIS — R635 Abnormal weight gain: Secondary | ICD-10-CM

## 2022-08-17 DIAGNOSIS — Z820 Family history of epilepsy and other diseases of the nervous system: Secondary | ICD-10-CM

## 2022-08-18 NOTE — Progress Notes (Signed)
See procedure note.

## 2022-08-24 NOTE — Procedures (Signed)
[facility]  HOME SLEEP TEST (Watch PAT) REPORT  STUDY DATE: 08/17/2022  DOB: Jun 08, 1985  MRN: [id number]  ORDERING CLINICIAN: Huston Foley, MD, PhD   REFERRING CLINICIANChilton Greathouse, MD   CLINICAL INFORMATION/HISTORY: 37 year old female with an underlying complex medical history of type 1 diabetes, reflux disease, hypothyroidism, medulloblastoma with status post surgery in 1991, history of concussion, history of stroke in August 2022, neuropathy, followed by [facility] neurology, asthma, allergies, cognitive impairment, hearing loss, retinopathy, hyperlipidemia, and obesity, who reports snoring and excessive daytime somnolence, as well as witnessed apneas per family.   Epworth sleepiness score: 14/24.  BMI: 34.4 kg/m  FINDINGS:   Sleep Summary:   Total Recording Time (hours, min): 7 hours, 20 min  Total Sleep Time (hours, min):  6 hours, 52 min  Percent REM (%):    21.3%   Respiratory Indices:   Calculated pAHI (per hour):  21.6/hour         REM pAHI:    31.7/hour       NREM pAHI: 18.8/hour  Central pAHI: 2.4/hour  Oxygen Saturation Statistics:    Oxygen Saturation (%) Mean: 93%   Minimum oxygen saturation (%):                 82%   O2 Saturation Range (%): 82 - 98%    O2 Saturation (minutes) <=88%: 1.9 min  Pulse Rate Statistics:   Pulse Mean (bpm):    96/min    Pulse Range (66 - 110/min)   IMPRESSION: OSA (obstructive sleep apnea), moderate  RECOMMENDATION:  This home sleep test demonstrates moderate obstructive sleep apnea with a total AHI of 21.6/hour and O2 nadir of 82%.  Intermittent mild to moderate snoring was detected. Treatment with a positive airway pressure (PAP) device is recommended. The patient will be advised to proceed with an autoPAP titration/trial at home for now. A full night titration study may be considered to optimize treatment settings, monitor proper oxygen saturations and aid with improvement of tolerance and  adherence, if needed down the road. Alternative treatment options may include a dental device through dentistry or orthodontics in selected patients or Inspire (hypoglossal nerve stimulator) in carefully selected patients (meeting inclusion criteria).  Concomitant weight loss is recommended (where clinically appropriate). Please note that untreated obstructive sleep apnea may carry additional perioperative morbidity. Patients with significant obstructive sleep apnea should receive perioperative PAP therapy and the surgeons and particularly the anesthesiologist should be informed of the diagnosis and the severity of the sleep disordered breathing. The patient should be cautioned not to drive, work at heights, or operate dangerous or heavy equipment when tired or sleepy. Review and reiteration of good sleep hygiene measures should be pursued with any patient. Other causes of the patient's symptoms, including circadian rhythm disturbances, an underlying mood disorder, medication effect and/or an underlying medical problem cannot be ruled out based on this test. Clinical correlation is recommended.  The patient and her referring provider will be notified of the test results. The patient will be seen in follow up in sleep clinic at [facility].  I certify that I have reviewed the raw data recording prior to the issuance of this report in accordance with the standards of the American Academy of Sleep Medicine (AASM).  INTERPRETING PHYSICIAN:   Huston Foley, MD, PhD Medical Director, [facility]) Diplomat, ABPN (Neurology and Sleep)   [facility] [address] [place], [place] [address] [phone]

## 2022-08-24 NOTE — Addendum Note (Signed)
Addended by: Huston Foley on: 08/24/2022 02:44 PM   Modules accepted: Orders

## 2022-09-27 NOTE — Telephone Encounter (Signed)
Tracey Perkins, Stacy  Joniqua Sidle, Abbe Amsterdam, CMA; Elsie Lincoln, [name] Got It Thank you       Previous Messages    ----- Message ----- From: Bobbye Morton, CMA Sent: 09/27/2022   7:52 AM EDT To: Marlou Porch Tracey Perkins Subject: cpap                                          New orders have been placed for the above pt, DOB: 11-Dec-1985 Thanks

## 2022-10-14 ENCOUNTER — Emergency Department (HOSPITAL_BASED_OUTPATIENT_CLINIC_OR_DEPARTMENT_OTHER): Payer: Medicaid Other

## 2022-10-14 ENCOUNTER — Emergency Department (HOSPITAL_BASED_OUTPATIENT_CLINIC_OR_DEPARTMENT_OTHER)
Admission: EM | Admit: 2022-10-14 | Discharge: 2022-10-14 | Disposition: A | Discharge status: Home / Self Care | Payer: Medicaid Other | Attending: Emergency Medicine | Admitting: Emergency Medicine

## 2022-10-14 ENCOUNTER — Encounter (HOSPITAL_BASED_OUTPATIENT_CLINIC_OR_DEPARTMENT_OTHER): Payer: Self-pay | Admitting: Emergency Medicine

## 2022-10-14 ENCOUNTER — Other Ambulatory Visit: Payer: Self-pay

## 2022-10-14 DIAGNOSIS — K3184 Gastroparesis: Secondary | ICD-10-CM | POA: Insufficient documentation

## 2022-10-14 DIAGNOSIS — Z79899 Other long term (current) drug therapy: Secondary | ICD-10-CM | POA: Diagnosis not present

## 2022-10-14 DIAGNOSIS — Z79890 Hormone replacement therapy (postmenopausal): Secondary | ICD-10-CM | POA: Diagnosis not present

## 2022-10-14 DIAGNOSIS — R112 Nausea with vomiting, unspecified: Secondary | ICD-10-CM

## 2022-10-14 DIAGNOSIS — E119 Type 2 diabetes mellitus without complications: Secondary | ICD-10-CM | POA: Insufficient documentation

## 2022-10-14 DIAGNOSIS — J45909 Unspecified asthma, uncomplicated: Secondary | ICD-10-CM | POA: Diagnosis not present

## 2022-10-14 DIAGNOSIS — Z7982 Long term (current) use of aspirin: Secondary | ICD-10-CM | POA: Insufficient documentation

## 2022-10-14 DIAGNOSIS — Z1152 Encounter for screening for COVID-19: Secondary | ICD-10-CM | POA: Diagnosis not present

## 2022-10-14 DIAGNOSIS — Z794 Long term (current) use of insulin: Secondary | ICD-10-CM | POA: Insufficient documentation

## 2022-10-14 DIAGNOSIS — E039 Hypothyroidism, unspecified: Secondary | ICD-10-CM | POA: Insufficient documentation

## 2022-10-14 DIAGNOSIS — R000 Tachycardia, unspecified: Secondary | ICD-10-CM | POA: Insufficient documentation

## 2022-10-14 LAB — RAPID URINE DRUG SCREEN, HOSP PERFORMED
Amphetamines: NOT DETECTED
Barbiturates: NOT DETECTED
Benzodiazepines: NOT DETECTED
Cocaine: NOT DETECTED
Opiates: NOT DETECTED
Tetrahydrocannabinol: NOT DETECTED

## 2022-10-14 LAB — RESP PANEL BY RT-PCR (RSV, FLU A&B, COVID)  RVPGX2
Influenza A by PCR: NEGATIVE
Influenza B by PCR: NEGATIVE
Resp Syncytial Virus by PCR: NEGATIVE
SARS Coronavirus 2 by RT PCR: NEGATIVE

## 2022-10-14 LAB — LIPASE, BLOOD: Lipase: 80 U/L — ABNORMAL HIGH (ref 11–51)

## 2022-10-14 LAB — COMPREHENSIVE METABOLIC PANEL
ALT: 14 U/L (ref 0–44)
AST: 18 U/L (ref 15–41)
Albumin: 4.1 g/dL (ref 3.5–5.0)
Alkaline Phosphatase: 115 U/L (ref 38–126)
Anion gap: 16 — ABNORMAL HIGH (ref 5–15)
BUN: 8 mg/dL (ref 6–20)
CO2: 26 mmol/L (ref 22–32)
Calcium: 8.9 mg/dL (ref 8.9–10.3)
Chloride: 95 mmol/L — ABNORMAL LOW (ref 98–111)
Creatinine, Ser: 1.09 mg/dL — ABNORMAL HIGH (ref 0.44–1.00)
GFR, Estimated: >60 mL/min (ref >60)
Glucose, Bld: 274 mg/dL — ABNORMAL HIGH (ref 70–99)
Potassium: 3.9 mmol/L (ref 3.5–5.1)
Sodium: 137 mmol/L (ref 135–145)
Total Bilirubin: 0.1 mg/dL — ABNORMAL LOW (ref 0.3–1.2)
Total Protein: 7.9 g/dL (ref 6.5–8.1)

## 2022-10-14 LAB — CBC WITH DIFFERENTIAL/PLATELET
Abs Immature Granulocytes: 0.09 10*3/uL — ABNORMAL HIGH (ref 0.00–0.07)
Basophils Absolute: 0 10*3/uL (ref 0.0–0.1)
Basophils Relative: 0 %
Eosinophils Absolute: 0.1 10*3/uL (ref 0.0–0.5)
Eosinophils Relative: 1 %
HCT: 41.5 % (ref 36.0–46.0)
Hemoglobin: 13.6 g/dL (ref 12.0–15.0)
Immature Granulocytes: 1 %
Lymphocytes Relative: 20 %
Lymphs Abs: 2.3 10*3/uL (ref 0.7–4.0)
MCH: 29.6 pg (ref 26.0–34.0)
MCHC: 32.8 g/dL (ref 30.0–36.0)
MCV: 90.4 fL (ref 80.0–100.0)
Monocytes Absolute: 1.2 10*3/uL — ABNORMAL HIGH (ref 0.1–1.0)
Monocytes Relative: 11 %
Neutro Abs: 7.6 10*3/uL (ref 1.7–7.7)
Neutrophils Relative %: 67 %
Platelets: 406 10*3/uL — ABNORMAL HIGH (ref 150–400)
RBC: 4.59 MIL/uL (ref 3.87–5.11)
RDW: 13.3 % (ref 11.5–15.5)
WBC: 11.4 10*3/uL — ABNORMAL HIGH (ref 4.0–10.5)
nRBC: 0 % (ref 0.0–0.2)

## 2022-10-14 LAB — URINALYSIS, ROUTINE W REFLEX MICROSCOPIC
Bilirubin Urine: NEGATIVE
Glucose, UA: 250 mg/dL — AB
Ketones, ur: NEGATIVE mg/dL
Leukocytes,Ua: NEGATIVE
Nitrite: NEGATIVE
Protein, ur: NEGATIVE mg/dL
Specific Gravity, Urine: 1.015 (ref 1.005–1.030)
pH: 7 (ref 5.0–8.0)

## 2022-10-14 LAB — URINALYSIS, MICROSCOPIC (REFLEX)

## 2022-10-14 LAB — CBG MONITORING, ED: Glucose-Capillary: 268 mg/dL — ABNORMAL HIGH (ref 70–99)

## 2022-10-14 LAB — HCG, SERUM, QUALITATIVE: Preg, Serum: NEGATIVE

## 2022-10-14 MED ORDER — FAMOTIDINE IN NACL 20-0.9 MG/50ML-% IV SOLN
20.0000 mg | Freq: Once | INTRAVENOUS | Status: AC
  Administered 2022-10-14: 20 mg via INTRAVENOUS
  Filled 2022-10-14: qty 50

## 2022-10-14 MED ORDER — DICYCLOMINE HCL 20 MG PO TABS: 20.0000 mg | ORAL_TABLET | Freq: Two times a day (BID) | ORAL | 0 refills | Status: AC | PRN

## 2022-10-14 MED ORDER — IOHEXOL 300 MG/ML  SOLN
100.0000 mL | Freq: Once | INTRAMUSCULAR | Status: AC | PRN
  Administered 2022-10-14: 100 mL via INTRAVENOUS

## 2022-10-14 MED ORDER — LACTATED RINGERS IV SOLN: INTRAVENOUS | Status: DC

## 2022-10-14 MED ORDER — LACTATED RINGERS IV BOLUS
1000.0000 mL | Freq: Once | INTRAVENOUS | Status: AC
  Administered 2022-10-14: 1000 mL via INTRAVENOUS

## 2022-10-14 MED ORDER — ONDANSETRON HCL 4 MG/2ML IJ SOLN
4.0000 mg | Freq: Once | INTRAMUSCULAR | Status: AC
  Administered 2022-10-14: 4 mg via INTRAVENOUS
  Filled 2022-10-14: qty 2

## 2022-10-14 MED ORDER — ONDANSETRON HCL 4 MG/2ML IJ SOLN
4.0000 mg | Freq: Once | INTRAMUSCULAR | Status: DC
  Filled 2022-10-14: qty 2

## 2022-10-14 MED ORDER — ACETAMINOPHEN 325 MG PO TABS
650.0000 mg | ORAL_TABLET | Freq: Once | ORAL | Status: DC
  Filled 2022-10-14: qty 2

## 2022-10-14 MED ORDER — AMOXICILLIN-POT CLAVULANATE 875-125 MG PO TABS: 1.0000 | ORAL_TABLET | Freq: Two times a day (BID) | ORAL | 0 refills | Status: DC

## 2022-10-14 MED ORDER — ONDANSETRON HCL 4 MG PO TABS: 4.0000 mg | ORAL_TABLET | ORAL | 0 refills | Status: AC | PRN

## 2022-10-14 MED ORDER — DICYCLOMINE HCL 10 MG PO CAPS
10.0000 mg | ORAL_CAPSULE | Freq: Once | ORAL | Status: AC
  Administered 2022-10-14: 10 mg via ORAL
  Filled 2022-10-14: qty 1

## 2022-10-14 MED ORDER — LOPERAMIDE HCL 2 MG PO CAPS: 2.0000 mg | ORAL_CAPSULE | Freq: Four times a day (QID) | ORAL | 0 refills | Status: AC | PRN

## 2022-10-14 NOTE — ED Notes (Signed)
Triage delayed due to pt need to have bowel movement.

## 2022-10-14 NOTE — Discharge Instructions (Addendum)
You were given prescription for antibiotics/augmentin, you can start taking this if symptoms continue beyond 5 days  Please follow up with pcp/gastroenterology    There are many causes of abdominal pain. Most pain is not serious and goes away, but some pain gets worse, changes, or will not go away. Please return to the emergency department or see your doctor right away if you (or your family member) experience any of the following:  1. Pain that gets worse or moves to just one spot.  2. Pain that gets worse if you cough or sneeze.  3. Pain with going over a bump in the road.  4. Pain that does not get better in 24 hours.  5. Inability to keep down liquids (vomiting)-especially if you are making less urine.  6. Fainting.  7. Blood in the vomit or stool.  8. High fever or shaking chills.  9. Swelling of the abdomen.  10. Any new or worsening problem.     Additional Instructions  No alcohol.  No caffeine, aspirin, or cigarettes.   Please return to the emergency department immediately for any new or concerning symptoms, or if you get worse.

## 2022-10-14 NOTE — ED Notes (Signed)
Recliner for family member

## 2022-10-14 NOTE — ED Triage Notes (Signed)
Per pt mom, woke up with NVD and abdominal pain, also happened Sunday, and has Hx of gastroparesis.

## 2022-10-14 NOTE — ED Provider Notes (Signed)
[place] [facility] AT [facility] Provider Note  CSN: [id number] Arrival date & time: 10/14/22 0600  Chief Complaint(s) Abdominal Pain, Emesis, and Diarrhea  HPI Tracey Perkins is a 37 y.o. female with past medical history as below, significant for IDDM, hypothyroid, gastroparesis, brain tumor, CVA who presents to the ED with complaint of nausea, vomiting, diarrhea, abd pain.   History of gastroparesis, multiple episodes of this in the past.  She had similar episode about a week ago that resolved spontaneously but was associated fever.  Was feeling well up until last night, began having nausea, vomiting, diarrhea, diffuse abdominal cramping.  No fevers.  No rashes.  No recent medication or diet changes.  Glucose well-controlled at home per mother.  Mother assist with care.  No recent medication or diet changes.  No recent trauma or speech p.o. intake.  No change to bladder function.  No brpr or melena, emesis nbnb  Past Medical History Past Medical History:  Diagnosis Date   Asthma    Brain tumor (HCC)    Diabetes mellitus without complication (HCC)    Eczema    Food allergy    Gastroparesis    Hypokalemia    Hypomagnesemia    Hypothyroid    Neuropathy    Post concussion syndrome 05/2019   Vision changes    Patient Active Problem List   Diagnosis Date Noted   Colitis 12/22/2021   Tachycardia 07/12/2021   Intractable nausea and vomiting 07/12/2021   Diarrhea 07/12/2021   Diabetic neuropathy (HCC) 07/12/2021   Hypothyroidism 07/12/2021   CVA (cerebral vascular accident) (HCC) 09/16/2020   Mild cognitive impairment 09/04/2020   Diabetic gastroparesis (HCC) 09/04/2020   History of CVA (cerebrovascular accident) 09/03/2020   Diabetic polyneuropathy associated with type 1 diabetes mellitus (HCC) 05/15/2020   Insulin pump status 04/02/2020   Impulsiveness 10/14/2019   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   Mood disorder  (HCC) 08/27/2019   Anxiety 08/16/2019   Polyneuropathy 06/19/2019   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   Status post radiation therapy 11/22/2018   Seasonal and perennial allergic rhinitis 11/22/2018   Seasonal allergic conjunctivitis 11/22/2018   Acute sinusitis 11/22/2018   Keratosis pilaris 10/05/2017   Melanocytic nevi of trunk 10/05/2017   Adverse food reaction 02/09/2017   Gastroesophageal reflux disease 02/09/2017   Mild intermittent asthma with acute exacerbation 11/19/2015   Mild persistent asthma without complication 11/16/2015   Acute seasonal allergic rhinitis due to pollen 11/16/2015   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 Authorizing Provider  amoxicillin-clavulanate (AUGMENTIN) 875-125 MG tablet Take 1 tablet by mouth every 12 (twelve) hours. 10/19/22  Yes Tanda Rockers A, DO  dicyclomine (BENTYL) 20 MG tablet Take 1 tablet (20 mg total) by mouth 2 (two) times daily as needed for up to 7 days for spasms. 10/14/22 10/21/22 Yes Sloan Leiter, DO  loperamide (IMODIUM) 2 MG capsule Take 1 capsule (2 mg total) by mouth 4 (four) times daily as needed for diarrhea or loose stools. 10/14/22  Yes Sloan Leiter,  DO  ondansetron (ZOFRAN) 4 MG tablet Take 1 tablet (4 mg total) by mouth every 4 (four) hours as needed for nausea or vomiting. 10/14/22  Yes Tanda Rockers A, DO  acetaminophen (TYLENOL) 500 MG tablet Take 500 mg by mouth as needed for mild pain or headache.    [provider]  aspirin EC 81 MG tablet Take 81 mg by mouth  daily. Swallow whole.    [provider]  citalopram (CELEXA) 10 MG tablet Take 0.5 tablets (5 mg total) by mouth at bedtime. 01/11/22   Karsten Ro, MD  Continuous Blood Gluc Sensor (DEXCOM G6 SENSOR) MISC Apply 1 Device topically See admin instructions. Place 1 sensor into the skin every 10 days Dexcom 7 06/03/21   [provider]  Continuous Blood Gluc Transmit (DEXCOM G6 TRANSMITTER) MISC Dexcom 7 01/26/21   [provider]  DERMOTIC 0.01 % OIL Place 4 drops into both ears See admin instructions. Place 4 drops into both ears 2 times a week as needed/as directed 05/15/20   [provider]  EPINEPHRINE 0.3 mg/0.3 mL IJ SOAJ injection Use as directed 04/12/22   Ambs, Norvel Richards, FNP  FLORASTOR 250 MG capsule Take 250 mg by mouth in the morning.    [provider]  fluticasone (FLONASE) 50 MCG/ACT nasal spray 2 sprays per nostril daily as needed for stuffy nose. 04/12/22   Hetty Blend, FNP  fluticasone (FLOVENT HFA) 110 MCG/ACT inhaler Inhale 2 puffs into the lungs 2 (two) times daily as needed (for asthma flares, until cough and wheeze-free). Patient taking differently: Inhale 2 puffs into the lungs every 30 (thirty) days. 07/14/21   Glade Lloyd, MD  Glucagon 3 MG/DOSE POWD Place 3 mg into the nose as needed (low blood sugar).    [provider]  insulin aspart (NOVOLOG) 100 UNIT/ML injection Inject into the skin See admin instructions. Per pump 12/24/19   [provider]  LANTUS SOLOSTAR 100 UNIT/ML Solostar Pen as needed (uses onlyl if her insulin pump fails).    [provider]  levothyroxine (SYNTHROID) 75 MCG tablet Take 75 mcg by mouth daily before breakfast. 04/05/21   [provider]  loratadine (CLARITIN) 10 MG tablet Take 1 tablet (10 mg total) by mouth daily as needed for allergies. 04/12/22   Ambs, Norvel Richards, FNP  lubiprostone (AMITIZA) 8 MCG capsule Take 16 mcg by mouth daily with breakfast.     [provider]  magnesium oxide (MAG-OX) 400 (240 Mg) MG tablet Take 800 mg by mouth at bedtime.    [provider]  meclizine (ANTIVERT) 25 MG tablet as needed. 06/19/19   [provider]  montelukast (SINGULAIR) 10 MG tablet Take 1 tablet (10 mg total) by mouth at bedtime. To prevent coughing or wheezing 04/12/22   Hetty Blend, FNP  Olopatadine HCl 0.6 % SOLN Place 2 sprays into both nostrils 2 (two) times daily. 06/24/22   Hetty Blend, FNP  pantoprazole (PROTONIX) 20 MG tablet Take 1 tablet (20 mg total) by mouth daily. Patient taking differently: Take 20 mg by mouth as needed. 04/12/22 04/12/23  Hetty Blend, FNP  Polyethyl Glycol-Propyl Glycol (SYSTANE OP) Place 1 drop into both eyes 3 (three) times daily as needed (for dryness).    [provider]  potassium chloride (KLOR-CON) 10 MEQ tablet Take 10 mEq by mouth daily. 03/23/21   [provider]  pravastatin (PRAVACHOL) 20 MG tablet Take 20 mg by mouth daily after supper.  [provider]  PROAIR HFA 108 [phone] Base) MCG/ACT inhaler Inhale 2 puffs into the lungs every 4 (four) hours as needed for wheezing or shortness of breath. TAKE 2 PUFFS BY MOUTH EVERY 4 HOURS AS NEEDED Strength: 108 (90 Base) MCG/ACT Patient taking differently: Inhale 2 puffs into the lungs every 4 (four) hours as needed for wheezing or shortness of breath. 06/09/21   Ambs, Norvel Richards, FNP  VENTOLIN HFA 108 (90 Base) MCG/ACT inhaler Inhale 2 puffs into the lungs every 4 (four) hours as needed for wheezing or shortness of breath. 04/12/22   Ambs, Norvel Richards, FNP  vitamin B-12 (CYANOCOBALAMIN) 1000 MCG tablet Take 1,000 mcg by mouth every 7 (seven) days.    [provider]                                                                                                                                    Past Surgical History Past Surgical History:  Procedure Laterality Date   BRAIN SURGERY     Portacath placement and removed     WISDOM TOOTH  EXTRACTION     Family History Family History  Problem Relation Age of Onset   Thyroid disease Mother    Sleep apnea Father    Eczema Sister    Thyroid disease Sister    Thyroid disease Sister     Social History Social History   Tobacco Use   Smoking status: Never   Smokeless tobacco: Never  Vaping Use   Vaping status: Never Used  Substance Use Topics   Alcohol use: No   Drug use: No   Allergies Banana, Bactrim [sulfamethoxazole-trimethoprim], Doxycycline, Gentian violet, Gentian violet-proflavine sulfate [triple dye], Mold extract [trichophyton], Morphine and codeine, Other, and Vancomycin  Review of Systems Review of Systems  Constitutional:  Negative for chills.  Gastrointestinal:  Positive for abdominal pain, diarrhea, nausea and vomiting.  Genitourinary:  Negative for dysuria and urgency.  Skin:  Negative for rash and wound.  All other systems reviewed and are negative.   Physical Exam Vital Signs  I have reviewed the triage vital signs BP 119/82   Pulse (!) 117   Temp 98.6 F (37 C)   Resp 20   Ht 5\' 3"  (1.6 m)   Wt 83.9 kg   LMP  (LMP Unknown) Comment: neg upreg in er 10/14/22  SpO2 92%   BMI 32.77 kg/m  Physical Exam Vitals and nursing note reviewed.  Constitutional:      General: She is not in acute distress.    Appearance: Normal appearance. She is well-developed. She is obese.  HENT:     Head: Normocephalic and atraumatic.     Right Ear: External ear normal.     Left Ear: External ear normal.     Nose: Nose normal.     Mouth/Throat:     Mouth: Mucous membranes are moist.  Eyes:     General:  No scleral icterus.       Right eye: No discharge.        Left eye: No discharge.  Cardiovascular:     Rate and Rhythm: Regular rhythm. Tachycardia present.     Pulses: Normal pulses.     Heart sounds: Normal heart sounds.  Pulmonary:     Effort: Pulmonary effort is normal. No respiratory distress.     Breath sounds: Normal breath sounds. No stridor.   Abdominal:     General: Abdomen is flat. There is no distension.     Palpations: Abdomen is soft.     Tenderness: There is abdominal tenderness in the epigastric area. There is no guarding or rebound.  Musculoskeletal:     Cervical back: No rigidity.     Right lower leg: No edema.     Left lower leg: No edema.  Skin:    General: Skin is warm and dry.     Capillary Refill: Capillary refill takes less than 2 seconds.  Neurological:     Mental Status: She is alert.  Psychiatric:        Mood and Affect: Mood normal.        Behavior: Behavior normal. Behavior is cooperative.     ED Results and Treatments Labs (all labs ordered are listed, but only abnormal results are displayed) Labs Reviewed  CBC WITH DIFFERENTIAL/PLATELET - Abnormal; Notable for the following components:      Result Value   WBC 11.4 (*)    Platelets 406 (*)    Monocytes Absolute 1.2 (*)    Abs Immature Granulocytes 0.09 (*)    All other components within normal limits  COMPREHENSIVE METABOLIC PANEL - Abnormal; Notable for the following components:   Chloride 95 (*)    Glucose, Bld 274 (*)    Creatinine, Ser 1.09 (*)    Total Bilirubin 0.1 (*)    Anion gap 16 (*)    All other components within normal limits  URINALYSIS, ROUTINE W REFLEX MICROSCOPIC - Abnormal; Notable for the following components:   APPearance HAZY (*)    Glucose, UA 250 (*)    Hgb urine dipstick LARGE (*)    All other components within normal limits  LIPASE, BLOOD - Abnormal; Notable for the following components:   Lipase 80 (*)    All other components within normal limits  URINALYSIS, MICROSCOPIC (REFLEX) - Abnormal; Notable for the following components:   Bacteria, UA RARE (*)    All other components within normal limits  CBG MONITORING, ED - Abnormal; Notable for the following components:   Glucose-Capillary 268 (*)    All other components within normal limits  RESP PANEL BY RT-PCR (RSV, FLU A&B, COVID)  RVPGX2  HCG, SERUM,  QUALITATIVE  RAPID URINE DRUG SCREEN, HOSP PERFORMED                                                                                                                          Radiology  CT ABDOMEN PELVIS W CONTRAST  Result Date: 10/14/2022 CLINICAL DATA:  Epigastric pain. EXAM: CT ABDOMEN AND PELVIS WITH CONTRAST TECHNIQUE: Multidetector CT imaging of the abdomen and pelvis was performed using the standard protocol following bolus administration of intravenous contrast. RADIATION DOSE REDUCTION: This exam was performed according to the departmental dose-optimization program which includes automated exposure control, adjustment of the mA and/or kV according to patient size and/or use of iterative reconstruction technique. CONTRAST:  OMNIPAQUE IOHEXOL 300 MG/ML  SOLN COMPARISON:  12/22/2021 FINDINGS: Lower chest: Lung bases are clear. Hepatobiliary: Normal appearance of the liver, gallbladder and portal venous system. No biliary dilatation. Pancreas: Unremarkable. No pancreatic ductal dilatation or surrounding inflammatory changes. Spleen: Normal in size without focal abnormality. Adrenals/Urinary Tract: Adrenal glands are unremarkable. Kidneys are normal, without renal calculi, focal lesion, or hydronephrosis. Bladder is unremarkable. Stomach/Bowel: Normal appearance of the stomach. No evidence for bowel obstruction. No evidence for focal bowel inflammation. Right side of the colon is moderately distended with fluid. Appendix appears to be within normal limits. Vascular/Lymphatic: Mild atherosclerotic disease in the abdominal aorta without aneurysm. Main visceral arteries are patent. Small mesenteric lymph nodes are similar to the previous examination. No significant lymph node enlargement in the abdomen or pelvis. Reproductive: Again noted is a solid structure along the left side of the uterus on image 75/301. This structure measures up to 3.8 cm and this is similar to a structure seen on the pelvic MRI  from 2022. This finding has not significantly changed since the previous CT in 2023. No other significant abnormality involving the uterus or adnexal structures. Other: Negative for free fluid.  Negative for free air. Musculoskeletal: No acute bone abnormality. IMPRESSION: 1. Right colon is moderately distended with fluid. There is no significant wall thickening. Findings are nonspecific. Otherwise, no acute abnormality in the abdomen or pelvis. 2. Solid structure along the left side of the uterus is similar to the previous examinations. This is favored to represent a uterine fibroid based on previous MRI report. 3. Aortic Atherosclerosis (ICD10-I70.0). Electronically Signed   By: Richarda Overlie M.D.   On: 10/14/2022 09:36    Pertinent labs & imaging results that were available during my care of the patient were reviewed by me and considered in my medical decision making (see MDM for details).  Medications Ordered in ED Medications  lactated ringers infusion (0 mLs Intravenous Stopped 10/14/22 1036)  ondansetron (ZOFRAN) injection 4 mg (0 mg Intravenous Hold 10/14/22 0801)  acetaminophen (TYLENOL) tablet 650 mg (650 mg Oral Not Given 10/14/22 1019)  ondansetron (ZOFRAN) injection 4 mg (4 mg Intravenous Given 10/14/22 0638)  lactated ringers bolus 1,000 mL (0 mLs Intravenous Stopped 10/14/22 0750)  famotidine (PEPCID) IVPB 20 mg premix (0 mg Intravenous Stopped 10/14/22 0948)  dicyclomine (BENTYL) capsule 10 mg (10 mg Oral Given 10/14/22 0800)  iohexol (OMNIPAQUE) 300 MG/ML solution 100 mL (100 mLs Intravenous Contrast Given 10/14/22 0833)  Procedures Procedures  (including critical care time)  Medical Decision Making / ED Course    Medical Decision Making:    OBIE SILOS is a 37 y.o. female with past medical history as below, significant for IDDM, hypothyroid,  gastroparesis, brain tumor, CVA who presents to the ED with complaint of nausea, vomiting, diarrhea, abd pain. . The complaint involves an extensive differential diagnosis and also carries with it a high risk of complications and morbidity.  Serious etiology was considered. Ddx includes but is not limited to: Differential diagnosis includes but is not exclusive to acute cholecystitis, intrathoracic causes for epigastric abdominal pain, gastritis, duodenitis, pancreatitis, small bowel or large bowel obstruction, abdominal aortic aneurysm, hernia, gastritis, etc. Differential diagnosis includes but is not exclusive to ectopic pregnancy, ovarian cyst, ovarian torsion, acute appendicitis, urinary tract infection, endometriosis, bowel obstruction, hernia, colitis, renal colic, gastroenteritis, volvulus etc.   Complete initial physical exam performed, notably the patient  was NAD, she is tachycardic, afebrile, abdomen nonperitoneal.    Reviewed and confirmed nursing documentation for past medical history, family history, social history.  Vital signs reviewed.    Clinical Course as of 10/14/22 1045  Fri Oct 14, 2022  0957 SpO2(!): 89 % OSA, recently diagnosed, hypoxia while asleep, normalizes when awake, no cp or dib [SG]  1003 Feeling better on recheck, tolerant p.o. without difficulty.  Labs and imaging are stable [SG]  1004 Pulse Rate(!): 113 Heart rate is typically elevated, improved from arrival [SG]    Clinical Course User Index [SG] Tanda Rockers A, DO   Feeling better, sleeping  Tolerating PO  Labs stable  CT stable  Supportive care for home, bland diet, anti-emetic, f/u with GI  Abx for home if diarrhea persists >5 days and f/u with pcp at that point  Patient presents with vomiting, diarrhea, and abdominal cramping. Sx suggestive of enteritis or food born illness. Surgical or other more serious etiology appears very unlikely. The patient is improved with ED treatment. Will discharge  with observation and symptomatic treatment. Abdominal pain warnings discussed.  The patient improved significantly and was discharged in stable condition. Detailed discussions were had with the patient regarding current findings, and need for close f/u with PCP or on call doctor. The patient has been instructed to return immediately if the symptoms worsen in any way for re-evaluation. Patient verbalized understanding and is in agreement with current care plan. All questions answered prior to discharge.                   Additional history obtained: -Additional history obtained from mother -External records from outside source obtained and reviewed including: Chart review including previous notes, labs, imaging, consultation notes including  Home medications,, primary care recommendation Follows with GI Dr. Dulce Sellar   Lab Tests: -I ordered, reviewed, and interpreted labs.   The pertinent results include:   Labs Reviewed  CBC WITH DIFFERENTIAL/PLATELET - Abnormal; Notable for the following components:      Result Value   WBC 11.4 (*)    Platelets 406 (*)    Monocytes Absolute 1.2 (*)    Abs Immature Granulocytes 0.09 (*)    All other components within normal limits  COMPREHENSIVE METABOLIC PANEL - Abnormal; Notable for the following components:   Chloride 95 (*)    Glucose, Bld 274 (*)    Creatinine, Ser 1.09 (*)    Total Bilirubin 0.1 (*)    Anion gap 16 (*)    All other components within normal limits  URINALYSIS, ROUTINE W REFLEX MICROSCOPIC - Abnormal; Notable for the following components:   APPearance HAZY (*)    Glucose, UA 250 (*)    Hgb urine dipstick LARGE (*)    All other components within normal limits  LIPASE, BLOOD - Abnormal; Notable for the following components:   Lipase 80 (*)    All other components within normal limits  URINALYSIS, MICROSCOPIC (REFLEX) - Abnormal; Notable for the following components:   Bacteria, UA RARE (*)    All other  components within normal limits  CBG MONITORING, ED - Abnormal; Notable for the following components:   Glucose-Capillary 268 (*)    All other components within normal limits  RESP PANEL BY RT-PCR (RSV, FLU A&B, COVID)  RVPGX2  HCG, SERUM, QUALITATIVE  RAPID URINE DRUG SCREEN, HOSP PERFORMED    Notable for glucose elev, bicarb stable, DKA Unlikely. UA w/o ketones. Labs o/w stable   EKG   EKG Interpretation Date/Time:    Ventricular Rate:    PR Interval:    QRS Duration:    QT Interval:    QTC Calculation:   R Axis:      Text Interpretation:           Imaging Studies ordered: I ordered imaging studies including CTAP I independently visualized the following imaging with scope of interpretation limited to determining acute life threatening conditions related to emergency care; findings noted above, significant for ?colitis, fibroid (hx of) I independently visualized and interpreted imaging. I agree with the radiologist interpretation   Medicines ordered and prescription drug management: Meds ordered this encounter  Medications   ondansetron (ZOFRAN) injection 4 mg   lactated ringers bolus 1,000 mL   famotidine (PEPCID) IVPB 20 mg premix   dicyclomine (BENTYL) capsule 10 mg   lactated ringers infusion   ondansetron (ZOFRAN) injection 4 mg   iohexol (OMNIPAQUE) 300 MG/ML solution 100 mL   acetaminophen (TYLENOL) tablet 650 mg   amoxicillin-clavulanate (AUGMENTIN) 875-125 MG tablet    Sig: Take 1 tablet by mouth every 12 (twelve) hours.    Dispense:  14 tablet    Refill:  0   ondansetron (ZOFRAN) 4 MG tablet    Sig: Take 1 tablet (4 mg total) by mouth every 4 (four) hours as needed for nausea or vomiting.    Dispense:  12 tablet    Refill:  0   loperamide (IMODIUM) 2 MG capsule    Sig: Take 1 capsule (2 mg total) by mouth 4 (four) times daily as needed for diarrhea or loose stools.    Dispense:  12 capsule    Refill:  0   dicyclomine (BENTYL) 20 MG tablet    Sig:  Take 1 tablet (20 mg total) by mouth 2 (two) times daily as needed for up to 7 days for spasms.    Dispense:  14 tablet    Refill:  0    -I have reviewed the patients home medicines and have made adjustments as needed   Consultations Obtained: na   Cardiac Monitoring: Continuous pulse oximetry interpreted by myself, 99% on RA, desat while sleeping (OSA).    Social Determinants of Health:  Diagnosis or treatment significantly limited by social determinants of health: na   Reevaluation: After the interventions noted above, I reevaluated the patient and found that they have improved  Co morbidities that complicate the patient evaluation  Past Medical History:  Diagnosis Date   Asthma    Brain tumor (HCC)    Diabetes mellitus  without complication (HCC)    Eczema    Food allergy    Gastroparesis    Hypokalemia    Hypomagnesemia    Hypothyroid    Neuropathy    Post concussion syndrome 05/2019   Vision changes       Dispostion: Disposition decision including need for hospitalization was considered, and patient discharged from emergency department.    Final Clinical Impression(s) / ED Diagnoses Final diagnoses:  Gastroparesis  Nausea vomiting and diarrhea        Sloan Leiter, DO 10/14/22 1045

## 2022-10-14 NOTE — ED Provider Triage Note (Signed)
Emergency Medicine Provider Triage Evaluation Note  Tracey Perkins , a 37 y.o. female  was evaluated in triage.  Pt with diabetes and sensineural hearing loss complains of nausea vomiting and diarrhea. Had same on Sunday with a fever.    Review of Systems  Positive: Vomiting and diarrhea  Negative: rash  Physical Exam  Ht 5\' 3"  (1.6 m)   Wt 83.9 kg   LMP  (LMP Unknown)   BMI 32.77 kg/m  Gen:   Awake, no distress   Resp:  Normal effort  MSK:   Moves extremities without difficulty  Other:  NCAT  Medical Decision Making  Medically screening exam initiated at 6:35 AM.  Appropriate orders placed.  Mosie Epstein was informed that the remainder of the evaluation will be completed by another provider, this initial triage assessment does not replace that evaluation, and the importance of remaining in the ED until their evaluation is complete.     Khaleem Burchill, MD 10/14/22 [phone]

## 2022-10-17 NOTE — Patient Instructions (Incomplete)
Asthma Continue montelukast  10 mg once a day for coughing or wheezing Continue albuterol (ProAir, red inhaler) 2 puffs every 4 hours if needed for wheezing or coughing spells For asthma flares, begin Flovent 110 (orange inhaler)-2 puffs twice a day with a spacer for 2 weeks or until you are cough and wheeze free. Rinse your mouth after each use  Allergic rhinitis Continue Claritin 10 mg once a day if needed for runny nose or itchy eyes.  Continue fluticasone 1 spray per nostril twice a day if needed for stuffy nose. This may help your ears.  In the right nostril, point the applicator out toward the right ear. In the left nostril, point the applicator out toward the left ear Astelin nasal spray 2 sprays in each nostril twice a day as needed for runny nose or sneezing Continue saline nasal rinses as needed for nasal symptoms. Use this before any medicated nasal sprays for best result Continue environmental control of pollens, dust mite, and mold as listed below Consider allergen immunotherapy if medications are not effective in controlling your symptoms  Allergic conjunctivitis Continue a daily lubricating eye drop such as Natural Tears or Systane as needed Some over the counter eye drops include Pataday one drop in each eye once a day as needed for red, itchy eyes OR Zaditor one drop in each eye twice a day as needed for red itchy eyes.  Reflux Restart pantoprazole as previously prescribed Follow up with your Gastroenterologist as you normally do  Adverse food reaction Continue to avoid banana. In case of an allergic reaction, take Benadryl 50 mg every 4 hours, and if life-threatening symptoms occur, inject with EpiPen 0.3 mg. A lab order has been placed to help [place] evaluate your banana allergy. We will call you when the result becomes available  Continue other medications as noted in your chart.  Call me if this treatment plan is not working well for you  Follow up in the clinic in 6  months or sooner as needed.  Reducing Pollen Exposure The American Academy of Allergy, Asthma and Immunology suggests the following steps to reduce your exposure to pollen during allergy seasons. Do not hang sheets or clothing out to dry; pollen may collect on these items. Do not mow lawns or spend time around freshly cut grass; mowing stirs up pollen. Keep windows closed at night.  Keep car windows closed while driving. Minimize morning activities outdoors, a time when pollen counts are usually at their highest. Stay indoors as much as possible when pollen counts or humidity is high and on windy days when pollen tends to remain in the air longer. Use air conditioning when possible.  Many air conditioners have filters that trap the pollen spores. Use a HEPA room air filter to remove pollen form the indoor air you breathe.   Control of Dust Mite Allergen Dust mites play a major role in allergic asthma and rhinitis. They occur in environments with high humidity wherever human skin is found. Dust mites absorb humidity from the atmosphere (ie, they do not drink) and feed on organic matter (including shed human and animal skin). Dust mites are a microscopic type of insect that you cannot see with the naked eye. High levels of dust mites have been detected from mattresses, pillows, carpets, upholstered furniture, bed covers, clothes, soft toys and any woven material. The principal allergen of the dust mite is found in its feces. A gram of dust may contain 1,000 mites and 250,000 fecal particles.  Mite antigen is easily measured in the air during house cleaning activities. Dust mites do not bite and do not cause harm to humans, other than by triggering allergies/asthma.  Ways to decrease your exposure to dust mites in your home:  1. Encase mattresses, box springs and pillows with a mite-impermeable barrier or cover  2. Wash sheets, blankets and drapes weekly in hot water (130 F) with detergent and dry  them in a dryer on the hot setting.  3. Have the room cleaned frequently with a vacuum cleaner and a damp dust-mop. For carpeting or rugs, vacuuming with a vacuum cleaner equipped with a high-efficiency particulate air (HEPA) filter. The dust mite allergic individual should not be in a room which is being cleaned and should wait 1 hour after cleaning before going into the room.  4. Do not sleep on upholstered furniture (eg, couches).  5. If possible removing carpeting, upholstered furniture and drapery from the home is ideal. Horizontal blinds should be eliminated in the rooms where the person spends the most time (bedroom, study, television room). Washable vinyl, roller-type shades are optimal.  6. Remove all non-washable stuffed toys from the bedroom. Wash stuffed toys weekly like sheets and blankets above.  7. Reduce indoor humidity to less than 50%. Inexpensive humidity monitors can be purchased at most hardware stores. Do not use a humidifier as can make the problem worse and are not recommended.  Control of Mold Allergen Mold and fungi can grow on a variety of surfaces provided certain temperature and moisture conditions exist.  Outdoor molds grow on plants, decaying vegetation and soil.  The major outdoor mold, Alternaria and Cladosporium, are found in very high numbers during hot and dry conditions.  Generally, a late Summer - Fall peak is seen for common outdoor fungal spores.  Rain will temporarily lower outdoor mold spore count, but counts rise rapidly when the rainy period ends.  The most important indoor molds are Aspergillus and Penicillium.  Dark, humid and poorly ventilated basements are ideal sites for mold growth.  The next most common sites of mold growth are the bathroom and the kitchen.  Outdoor [employer] Use air conditioning and keep windows closed Avoid exposure to decaying vegetation. Avoid leaf raking. Avoid grain handling. Consider wearing a face mask if working in  moldy areas.  Indoor Mold Control Maintain humidity below 50%. Clean washable surfaces with 5% bleach solution. Remove sources e.g. Contaminated carpets.

## 2022-10-17 NOTE — Progress Notes (Signed)
[address] [place] [address] Dept: [phone]  FOLLOW UP NOTE  Patient ID: Tracey Perkins, female    DOB: 16-Mar-1985  Age: 37 y.o. MRN: [id number] Date of Office Visit: 10/18/2022  Assessment  Chief Complaint: No chief complaint on file.  HPI Tracey Perkins is a 37 year old female who presents to the clinic for a follow up visit. She was last seen in this clinic on 04/12/2022 by Thermon Leyland, FNP, for evaluation of asthma, allergic rhinitis, allergic conjunctivitis, reflux, and food allergy.  Her history includes type 1 diabetes, gastroparesis, hypothyroidism, medulloblastoma with resection 1999, history of severe concussion, and stroke in 2022.  She is accompanied by her mother who assists with history.    Her last environmental allergy testing was on 11/16/2015 was positive to pollens, dust mite, and mold.  Her last skin testing to banana was negative on 11/16/2015. Drug Allergies:  Allergies  Allergen Reactions   Banana Swelling and Other (See Comments)    Mouth burning and swollen   Bactrim [Sulfamethoxazole-Trimethoprim] Hives   Doxycycline Hives   Gentian Violet Other (See Comments)    Mouth burns   Gentian Violet-Proflavine Sulfate [Triple Dye] Other (See Comments)    Mouth burns   Mold Extract [Trichophyton] Other (See Comments)    Hay Fever   Morphine And Codeine Hives   Other Hives, Itching and Other (See Comments)    "Hay fever, dust and pollen."  Per patient.- itching, runny nose   Vancomycin Other (See Comments)    Red man syndrome     Physical Exam: LMP  (LMP Unknown) Comment: neg upreg in er 10/14/22   Physical Exam  Diagnostics:    Assessment and Plan: No diagnosis found.  No orders of the defined types were placed in this encounter.   There are no Patient Instructions on file for this visit.  No follow-ups on file.    Thank you for the opportunity to care for this patient.  Please do not hesitate to contact me with questions.  Thermon Leyland, FNP [facility]

## 2022-10-18 ENCOUNTER — Other Ambulatory Visit: Payer: Self-pay

## 2022-10-18 ENCOUNTER — Ambulatory Visit: Payer: Medicaid Other | Admitting: Family Medicine

## 2022-10-18 ENCOUNTER — Encounter: Payer: Self-pay | Admitting: Family Medicine

## 2022-10-18 VITALS — BP 112/78 | HR 99 | Temp 97.7°F | Resp 20 | Wt 191.5 lb

## 2022-10-18 DIAGNOSIS — J3089 Other allergic rhinitis: Secondary | ICD-10-CM

## 2022-10-18 DIAGNOSIS — J302 Other seasonal allergic rhinitis: Secondary | ICD-10-CM

## 2022-10-18 DIAGNOSIS — J4530 Mild persistent asthma, uncomplicated: Secondary | ICD-10-CM

## 2022-10-18 DIAGNOSIS — E109 Type 1 diabetes mellitus without complications: Secondary | ICD-10-CM

## 2022-10-18 DIAGNOSIS — K219 Gastro-esophageal reflux disease without esophagitis: Secondary | ICD-10-CM | POA: Diagnosis not present

## 2022-10-18 DIAGNOSIS — T781XXS Other adverse food reactions, not elsewhere classified, sequela: Secondary | ICD-10-CM

## 2022-10-18 DIAGNOSIS — H1013 Acute atopic conjunctivitis, bilateral: Secondary | ICD-10-CM

## 2022-10-18 DIAGNOSIS — H1010 Acute atopic conjunctivitis, unspecified eye: Secondary | ICD-10-CM

## 2022-10-21 LAB — ALLERGEN BANANA: Allergen Banana IgE: 0.36 kU/L — AB

## 2022-11-01 NOTE — Progress Notes (Signed)
Can you please let this patient know that the banana testing results are in the low category. Please have her return to the clinic for a skin test to banana and a possible food challenge. Thank you

## 2022-11-15 ENCOUNTER — Telehealth: Payer: Self-pay | Admitting: Neurology

## 2022-11-15 NOTE — Telephone Encounter (Signed)
Pt was scheduled for her initial CPAP on (12/29/22) Pt was informed to bring machine and power cord to the appointment.   DME in pt's SnapShot. Between dates:(instructed to schedule between 31-89 days from 10-31-22)

## 2022-11-17 ENCOUNTER — Other Ambulatory Visit: Payer: Self-pay

## 2022-11-17 MED ORDER — MONTELUKAST SODIUM 10 MG PO TABS: ORAL_TABLET | ORAL | 1 refills | Status: DC

## 2022-11-22 ENCOUNTER — Other Ambulatory Visit: Payer: Self-pay

## 2022-11-22 MED ORDER — MONTELUKAST SODIUM 10 MG PO TABS: ORAL_TABLET | ORAL | 1 refills | Status: DC

## 2022-11-29 ENCOUNTER — Encounter (HOSPITAL_COMMUNITY): Payer: Self-pay | Admitting: Student

## 2022-11-29 ENCOUNTER — Ambulatory Visit (INDEPENDENT_AMBULATORY_CARE_PROVIDER_SITE_OTHER): Payer: Medicaid Other | Admitting: Student

## 2022-11-29 DIAGNOSIS — F330 Major depressive disorder, recurrent, mild: Secondary | ICD-10-CM

## 2022-11-29 DIAGNOSIS — F419 Anxiety disorder, unspecified: Secondary | ICD-10-CM

## 2022-11-29 MED ORDER — CITALOPRAM HYDROBROMIDE 10 MG PO TABS
5.0000 mg | ORAL_TABLET | Freq: Every day | ORAL | 0 refills | Status: DC
Start: 2022-11-29 — End: 2023-02-08

## 2022-11-29 NOTE — Progress Notes (Signed)
BH MD Outpatient Progress Note  11/29/2022 1:16 PM ADDILYNNE SIRIGNANO  MRN:  [id number]  Assessment:  TOLANDA MIELCAREK is a 37 y.o. female with a history of anxiety, mood disorder, OSA on CPAP, CVA, type 1 diabetes c/b diabetic gastroparesis and neuropathy, GERD, asthma, hypothyroidism, primary ovarian failure, medulloblastoma s/p chemotherapy and radiotherapy, sensorinueral hearing loss of both ear who presents in person to [facility] for medication management. She was last seen by Dr. Leone Haven here at [facility] on 12/2021 and was on citalopram 5 mg daily.   Patient has remained complaint with citalopram and has had continued stability in mood and anxiety.  Patient has requested to taper off citalopram which is reasonable given her mood and anxiety have been relatively stable for the past several months.  We did discuss possibly starting counseling to better aid with her anxiety and obtaining coping skills to better address her symptoms.  Patient will follow-up in January 2025 to evaluate for continued stability at that point and consider discontinuing citalopram and following up in February to re-evaluate.  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    Patient and plan of care will be discussed with the Attending MD, Dr. Josephina Shih, who agrees with the above statement and plan.   Subjective:  Chief Complaint: Medication Management   Interval History:   Patient's mother and patient present as a walk-in to reestablish psychiatric care after not seeing Dr. Leone Haven since December 2023.  She remained stable and compliant with Celexa 5 mg daily.  She reports that her psychosocial situation has been overall improved which has helped with her anxiety and depression.  She denies SI/HI/AVH.  She cites being in a relationship, nearly completing her masters degree, and avoiding certain toxic individuals is greatly aided with her mood and overall frequency of panic attacks.  She is endorses that she still has a panic attack approximately once every 2-3 weeks but she is able to cope with it at this time stating she will force herself to laugh which will allow her to focus on herself again.  She wanted to discuss coming off Celexa because she feels that it is no longer needed.  She reports that her stability in mood and anxiety has been ongoing for the past year.  She has not had therapy in the past.  After discussion with patient and patient's mother, they have elected for patient to come off citalopram in January as to ensure appropriate coverage over the holidays.  Patient denies any significant concerns about sleep or appetite and her sleep has dramatically improved since being on CPAP.  Visit Diagnosis:    ICD-10-CM   1. Anxiety  F41.9 citalopram (CELEXA) 10 MG tablet    2. Mild episode of recurrent major depressive disorder (HCC)  F33.0 citalopram (CELEXA) 10 MG tablet      Past Psychiatric History:  Diagnoses: MDD, GAD  Past Medical History:  Past Medical History:  Diagnosis Date   Asthma    Brain tumor (HCC)  Diabetes mellitus without complication (HCC)    Eczema    Food allergy    Gastroparesis    Hypokalemia    Hypomagnesemia    Hypothyroid    Neuropathy    Post concussion syndrome 05/2019   Vision changes     Past Surgical History:  Procedure Laterality Date   BRAIN SURGERY     Portacath placement and removed     WISDOM TOOTH  EXTRACTION      Family Psychiatric History: Maternal uncle schizophrenia and overdosed on pain pills. Paternal cousin and aunt bipolar   Family History:  Family History  Problem Relation Age of Onset   Thyroid disease Mother    Sleep apnea Father    Eczema Sister    Thyroid disease Sister    Thyroid disease Sister     Social History:  Academic/Vocational: Gaffer Social History   Socioeconomic History   Marital status: Single    Spouse name: Not on file   Number of children: Not on file   Years of education: Not on file   Highest education level: Not on file  Occupational History   Not on file  Tobacco Use   Smoking status: Never   Smokeless tobacco: Never  Vaping Use   Vaping status: Never Used  Substance and Sexual Activity   Alcohol use: No   Drug use: No   Sexual activity: Yes    Birth control/protection: Pill  Other Topics Concern   Not on file  Social History Narrative   Not on file   Social Determinants of Health   Financial Resource Strain: Low Risk  (09/08/2022)   Received from [facility]   Overall Financial Resource Strain (CARDIA)    Difficulty of Paying Living Expenses: Not hard at all  Food Insecurity: Food Insecurity Present (09/08/2022)   Received from [facility]   Hunger Vital Sign    Worried About Running Out of Food in the Last Year: Never true    Ran Out of Food in the Last Year: Sometimes true  Transportation Needs: Unmet Transportation Needs (09/08/2022)   Received from [facility]   PRAPARE - Transportation    In the past 12 months, has lack of transportation kept you from medical appointments or from getting medications?: No    Lack of Transportation (Non-Medical): Yes  Physical Activity: Not on file  Stress: Not on file  Social Connections: Not on file    Allergies:  Allergies  Allergen Reactions   Banana Swelling and Other (See Comments)    Mouth burning and  swollen   Bactrim [Sulfamethoxazole-Trimethoprim] Hives   Doxycycline Hives   Gentian Violet Other (See Comments)    Mouth burns   Gentian Violet-Proflavine Sulfate [Triple Dye] Other (See Comments)    Mouth burns   Mold Extract [Trichophyton] Other (See Comments)    Hay Fever   Morphine And Codeine Hives   Other Hives, Itching and Other (See Comments)    "Hay fever, dust and pollen."  Per patient.- itching, runny nose   Vancomycin Other (See Comments)    Red man syndrome     Current Medications: Current Outpatient Medications  Medication Sig Dispense Refill   acetaminophen (TYLENOL) 500 MG tablet Take 500 mg by mouth as needed for mild pain or headache.     aspirin EC 81 MG tablet Take 81 mg by mouth daily. Swallow whole.     citalopram (CELEXA) 10 MG tablet Take 0.5 tablets (5  mg total) by mouth at bedtime. 30 tablet 0   Continuous Blood Gluc Sensor (DEXCOM G6 SENSOR) MISC Apply 1 Device topically See admin instructions. Place 1 sensor into the skin every 10 days Dexcom 7     Continuous Blood Gluc Transmit (DEXCOM G6 TRANSMITTER) MISC Dexcom 7     DERMOTIC 0.01 % OIL Place 4 drops into both ears See admin instructions. Place 4 drops into both ears 2 times a week as needed/as directed     dicyclomine (BENTYL) 20 MG tablet Take 1 tablet (20 mg total) by mouth 2 (two) times daily as needed for up to 7 days for spasms. 14 tablet 0   EPINEPHRINE 0.3 mg/0.3 mL IJ SOAJ injection Use as directed 2 each 1   FLORASTOR 250 MG capsule Take 250 mg by mouth in the morning.     fluticasone (FLONASE) 50 MCG/ACT nasal spray 2 sprays per nostril daily as needed for stuffy nose. 16 g 5   fluticasone (FLOVENT HFA) 110 MCG/ACT inhaler Inhale 2 puffs into the lungs 2 (two) times daily as needed (for asthma flares, until cough and wheeze-free). (Patient taking differently: Inhale 2 puffs into the lungs every 30 (thirty) days.)     Glucagon 3 MG/DOSE POWD Place 3 mg into the nose as needed (low blood  sugar).     insulin aspart (NOVOLOG) 100 UNIT/ML injection Inject into the skin See admin instructions. Per pump     LANTUS SOLOSTAR 100 UNIT/ML Solostar Pen as needed (uses onlyl if her insulin pump fails).     levothyroxine (SYNTHROID) 75 MCG tablet Take 75 mcg by mouth daily before breakfast.     loperamide (IMODIUM) 2 MG capsule Take 1 capsule (2 mg total) by mouth 4 (four) times daily as needed for diarrhea or loose stools. 12 capsule 0   loratadine (CLARITIN) 10 MG tablet Take 1 tablet (10 mg total) by mouth daily as needed for allergies. 30 tablet 5   lubiprostone (AMITIZA) 8 MCG capsule Take 16 mcg by mouth daily with breakfast.      magnesium oxide (MAG-OX) 400 (240 Mg) MG tablet Take 800 mg by mouth at bedtime.     meclizine (ANTIVERT) 25 MG tablet as needed.     montelukast (SINGULAIR) 10 MG tablet 1 tablet by mouth at bedtime to prevent coughing or wheezing. 90 tablet 1   Olopatadine HCl 0.6 % SOLN Place 2 sprays into both nostrils 2 (two) times daily. 30.5 g 5   ondansetron (ZOFRAN) 4 MG tablet Take 1 tablet (4 mg total) by mouth every 4 (four) hours as needed for nausea or vomiting. 12 tablet 0   pantoprazole (PROTONIX) 20 MG tablet Take 1 tablet (20 mg total) by mouth daily. (Patient taking differently: Take 20 mg by mouth as needed.) 30 tablet 5   Polyethyl Glycol-Propyl Glycol (SYSTANE OP) Place 1 drop into both eyes 3 (three) times daily as needed (for dryness).     potassium chloride (KLOR-CON) 10 MEQ tablet Take 10 mEq by mouth daily.     pravastatin (PRAVACHOL) 20 MG tablet Take 20 mg by mouth daily after supper.     PROAIR HFA 108 (90 Base) MCG/ACT inhaler Inhale 2 puffs into the lungs every 4 (four) hours as needed for wheezing or shortness of breath. TAKE 2 PUFFS BY MOUTH EVERY 4 HOURS AS NEEDED Strength: 108 (90 Base) MCG/ACT (Patient taking differently: Inhale 2 puffs into the lungs every 4 (four) hours as needed for wheezing or shortness of breath.)  6.7 g 0   VENTOLIN HFA  108 (90 Base) MCG/ACT inhaler Inhale 2 puffs into the lungs every 4 (four) hours as needed for wheezing or shortness of breath. 8 g 1   No current facility-administered medications for this visit.    ROS: Review of Systems   Objective:  Psychiatric Specialty Exam: There were no vitals taken for this visit.There is no height or weight on file to calculate BMI.  General Appearance: Casual  Eye Contact: Good  Speech:  Clear and Coherent  Volume:  Normal  Mood:  Euthymic  Affect:  Appropriate and Congruent  Thought Content: Logical   Suicidal Thoughts:  No  Homicidal Thoughts:  No  Thought Process:  Coherent, Goal Directed, and Linear  Orientation:  Full (Time, Place, and Person)    Memory: Remote;   Fair  Judgment:  Intact  Insight:  Fair  Concentration:  Concentration: Good  Recall: not formally assessed   Fund of Knowledge: Fair  Language: Fair  Psychomotor Activity:  Normal  Akathisia:  No  AIMS (if indicated): not done  Assets:  Communication Skills Desire for Improvement Financial Resources/Insurance Housing Intimacy Leisure Time Resilience Social Support Talents/Skills Transportation Vocational/Educational  ADL's:  Intact  Cognition: WNL  Sleep:  Good   PE: General: well-appearing; no acute distress  Pulm: no increased work of breathing on room air  Strength & Muscle Tone: within normal limits Neuro: no focal neurological deficits observed  Gait & Station: normal  Metabolic Disorder Labs: Lab Results  Component Value Date   HGBA1C 8.8 (H) 07/13/2021   MPG 206 07/13/2021   MPG 197.25 09/03/2020   No results found for: "PROLACTIN" Lab Results  Component Value Date   CHOL 215 (H) 09/03/2020   TRIG 198 (H) 09/03/2020   HDL 40 (L) 09/03/2020   CHOLHDL 5.4 09/03/2020   VLDL 40 09/03/2020   LDLCALC 135 (H) 09/03/2020   Lab Results  Component Value Date   TSH 0.669 07/13/2021    Therapeutic Level Labs: No results found for: "LITHIUM" No  results found for: "VALPROATE" No results found for: "CBMZ"  Screenings: GAD-7    Flowsheet Row Video Visit from 09/02/2021 in [facility] Video Visit from 03/11/2021 in [facility] Video Visit from 07/08/2020 in [facility] Video Visit from 04/20/2020 in [facility]  Total GAD-7 Score 6 7 8 7       PHQ2-9    Flowsheet Row Video Visit from 09/02/2021 in [facility] Video Visit from 03/11/2021 in [facility] Video Visit from 07/08/2020 in [facility] Video Visit from 04/20/2020 in [facility] Counselor from 09/17/2019 in [facility]  PHQ-2 Total Score 0 0 1 0 2  PHQ-9 Total Score 2 3 9 6 7       Flowsheet Row ED from 10/14/2022 in [facility] ED from 03/27/2022 in [facility] Emergency Department at [facility] ED to Hosp-Admission (Discharged) from 12/22/2021 in [facility]  C-SSRS RISK CATEGORY No Risk No Risk No Risk       Collaboration of Care: Collaboration of Care:  Patient/Guardian was advised Release of Information must be obtained prior to any record release in order to collaborate their care with an outside provider. Patient/Guardian was advised if they have not already done so to contact the registration  department to sign all necessary forms in order for [place] to release information regarding their care.   Consent: Patient/Guardian gives verbal consent for treatment and assignment of benefits for services provided during this visit. Patient/Guardian expressed understanding and agreed to proceed.   Park Pope, MD 11/29/2022, 1:16 PM

## 2022-12-18 ENCOUNTER — Encounter (HOSPITAL_COMMUNITY): Payer: Self-pay

## 2022-12-18 ENCOUNTER — Emergency Department (HOSPITAL_COMMUNITY): Payer: Medicaid Other

## 2022-12-18 ENCOUNTER — Other Ambulatory Visit: Payer: Self-pay

## 2022-12-18 ENCOUNTER — Inpatient Hospital Stay (HOSPITAL_COMMUNITY)
Admission: EM | Admit: 2022-12-18 | Discharge: 2022-12-23 | DRG: 871 | Disposition: A | Discharge status: Home / Self Care | Payer: Medicaid Other | Attending: Family Medicine | Admitting: Family Medicine

## 2022-12-18 ENCOUNTER — Inpatient Hospital Stay (HOSPITAL_COMMUNITY): Payer: Medicaid Other

## 2022-12-18 DIAGNOSIS — G4733 Obstructive sleep apnea (adult) (pediatric): Secondary | ICD-10-CM | POA: Diagnosis present

## 2022-12-18 DIAGNOSIS — Z8349 Family history of other endocrine, nutritional and metabolic diseases: Secondary | ICD-10-CM

## 2022-12-18 DIAGNOSIS — Z1152 Encounter for screening for COVID-19: Secondary | ICD-10-CM

## 2022-12-18 DIAGNOSIS — E1140 Type 2 diabetes mellitus with diabetic neuropathy, unspecified: Secondary | ICD-10-CM | POA: Diagnosis present

## 2022-12-18 DIAGNOSIS — N179 Acute kidney failure, unspecified: Secondary | ICD-10-CM | POA: Diagnosis present

## 2022-12-18 DIAGNOSIS — E785 Hyperlipidemia, unspecified: Secondary | ICD-10-CM | POA: Diagnosis present

## 2022-12-18 DIAGNOSIS — Z91018 Allergy to other foods: Secondary | ICD-10-CM

## 2022-12-18 DIAGNOSIS — Z885 Allergy status to narcotic agent status: Secondary | ICD-10-CM

## 2022-12-18 DIAGNOSIS — Z9641 Presence of insulin pump (external) (internal): Secondary | ICD-10-CM | POA: Diagnosis present

## 2022-12-18 DIAGNOSIS — E1043 Type 1 diabetes mellitus with diabetic autonomic (poly)neuropathy: Secondary | ICD-10-CM | POA: Diagnosis present

## 2022-12-18 DIAGNOSIS — F330 Major depressive disorder, recurrent, mild: Secondary | ICD-10-CM | POA: Diagnosis present

## 2022-12-18 DIAGNOSIS — E66811 Obesity, class 1: Secondary | ICD-10-CM | POA: Diagnosis present

## 2022-12-18 DIAGNOSIS — Z6833 Body mass index (BMI) 33.0-33.9, adult: Secondary | ICD-10-CM | POA: Diagnosis not present

## 2022-12-18 DIAGNOSIS — G3184 Mild cognitive impairment, so stated: Secondary | ICD-10-CM | POA: Diagnosis present

## 2022-12-18 DIAGNOSIS — F419 Anxiety disorder, unspecified: Secondary | ICD-10-CM | POA: Diagnosis present

## 2022-12-18 DIAGNOSIS — D649 Anemia, unspecified: Secondary | ICD-10-CM | POA: Diagnosis present

## 2022-12-18 DIAGNOSIS — Z8489 Family history of other specified conditions: Secondary | ICD-10-CM

## 2022-12-18 DIAGNOSIS — Z8673 Personal history of transient ischemic attack (TIA), and cerebral infarction without residual deficits: Secondary | ICD-10-CM | POA: Diagnosis not present

## 2022-12-18 DIAGNOSIS — E063 Autoimmune thyroiditis: Secondary | ICD-10-CM | POA: Diagnosis present

## 2022-12-18 DIAGNOSIS — K219 Gastro-esophageal reflux disease without esophagitis: Secondary | ICD-10-CM | POA: Diagnosis present

## 2022-12-18 DIAGNOSIS — Z888 Allergy status to other drugs, medicaments and biological substances status: Secondary | ICD-10-CM

## 2022-12-18 DIAGNOSIS — Z881 Allergy status to other antibiotic agents status: Secondary | ICD-10-CM

## 2022-12-18 DIAGNOSIS — E1042 Type 1 diabetes mellitus with diabetic polyneuropathy: Secondary | ICD-10-CM | POA: Diagnosis present

## 2022-12-18 DIAGNOSIS — Z79890 Hormone replacement therapy (postmenopausal): Secondary | ICD-10-CM

## 2022-12-18 DIAGNOSIS — E039 Hypothyroidism, unspecified: Secondary | ICD-10-CM | POA: Diagnosis present

## 2022-12-18 DIAGNOSIS — Z7982 Long term (current) use of aspirin: Secondary | ICD-10-CM

## 2022-12-18 DIAGNOSIS — Z85841 Personal history of malignant neoplasm of brain: Secondary | ICD-10-CM

## 2022-12-18 DIAGNOSIS — Z882 Allergy status to sulfonamides status: Secondary | ICD-10-CM

## 2022-12-18 DIAGNOSIS — J4521 Mild intermittent asthma with (acute) exacerbation: Secondary | ICD-10-CM | POA: Diagnosis present

## 2022-12-18 DIAGNOSIS — Z7951 Long term (current) use of inhaled steroids: Secondary | ICD-10-CM

## 2022-12-18 DIAGNOSIS — R509 Fever, unspecified: Principal | ICD-10-CM

## 2022-12-18 DIAGNOSIS — A419 Sepsis, unspecified organism: Principal | ICD-10-CM | POA: Diagnosis present

## 2022-12-18 DIAGNOSIS — J189 Pneumonia, unspecified organism: Secondary | ICD-10-CM | POA: Diagnosis present

## 2022-12-18 DIAGNOSIS — Z79899 Other long term (current) drug therapy: Secondary | ICD-10-CM

## 2022-12-18 DIAGNOSIS — Z794 Long term (current) use of insulin: Secondary | ICD-10-CM | POA: Diagnosis not present

## 2022-12-18 DIAGNOSIS — J9601 Acute respiratory failure with hypoxia: Secondary | ICD-10-CM | POA: Diagnosis not present

## 2022-12-18 DIAGNOSIS — E1065 Type 1 diabetes mellitus with hyperglycemia: Secondary | ICD-10-CM | POA: Diagnosis present

## 2022-12-18 DIAGNOSIS — Z91048 Other nonmedicinal substance allergy status: Secondary | ICD-10-CM

## 2022-12-18 DIAGNOSIS — E109 Type 1 diabetes mellitus without complications: Secondary | ICD-10-CM | POA: Diagnosis present

## 2022-12-18 DIAGNOSIS — R0902 Hypoxemia: Secondary | ICD-10-CM | POA: Diagnosis present

## 2022-12-18 DIAGNOSIS — K3184 Gastroparesis: Secondary | ICD-10-CM | POA: Diagnosis present

## 2022-12-18 LAB — COMPREHENSIVE METABOLIC PANEL
ALT: 23 U/L (ref 0–44)
AST: 27 U/L (ref 15–41)
Albumin: 3.9 g/dL (ref 3.5–5.0)
Alkaline Phosphatase: 91 U/L (ref 38–126)
Anion gap: 9 (ref 5–15)
BUN: 16 mg/dL (ref 6–20)
CO2: 28 mmol/L (ref 22–32)
Calcium: 8.5 mg/dL — ABNORMAL LOW (ref 8.9–10.3)
Chloride: 97 mmol/L — ABNORMAL LOW (ref 98–111)
Creatinine, Ser: 1.4 mg/dL — ABNORMAL HIGH (ref 0.44–1.00)
GFR, Estimated: 50 mL/min — ABNORMAL LOW (ref >60)
Glucose, Bld: 158 mg/dL — ABNORMAL HIGH (ref 70–99)
Potassium: 3.7 mmol/L (ref 3.5–5.1)
Sodium: 134 mmol/L — ABNORMAL LOW (ref 135–145)
Total Bilirubin: 0.3 mg/dL (ref <1.2)
Total Protein: 7.7 g/dL (ref 6.5–8.1)

## 2022-12-18 LAB — CBC WITH DIFFERENTIAL/PLATELET
Abs Immature Granulocytes: 0.04 10*3/uL (ref 0.00–0.07)
Basophils Absolute: 0.1 10*3/uL (ref 0.0–0.1)
Basophils Relative: 1 %
Eosinophils Absolute: 0.2 10*3/uL (ref 0.0–0.5)
Eosinophils Relative: 2 %
HCT: 36.8 % (ref 36.0–46.0)
Hemoglobin: 12.3 g/dL (ref 12.0–15.0)
Immature Granulocytes: 1 %
Lymphocytes Relative: 18 %
Lymphs Abs: 1.4 10*3/uL (ref 0.7–4.0)
MCH: 30.6 pg (ref 26.0–34.0)
MCHC: 33.4 g/dL (ref 30.0–36.0)
MCV: 91.5 fL (ref 80.0–100.0)
Monocytes Absolute: 0.7 10*3/uL (ref 0.1–1.0)
Monocytes Relative: 8 %
Neutro Abs: 5.6 10*3/uL (ref 1.7–7.7)
Neutrophils Relative %: 70 %
Platelets: 278 10*3/uL (ref 150–400)
RBC: 4.02 MIL/uL (ref 3.87–5.11)
RDW: 13.2 % (ref 11.5–15.5)
WBC: 7.9 10*3/uL (ref 4.0–10.5)
nRBC: 0 % (ref 0.0–0.2)

## 2022-12-18 LAB — URINALYSIS, W/ REFLEX TO CULTURE (INFECTION SUSPECTED)
Bilirubin Urine: NEGATIVE
Glucose, UA: 50 mg/dL — AB
Ketones, ur: NEGATIVE mg/dL
Leukocytes,Ua: NEGATIVE
Nitrite: NEGATIVE
Protein, ur: 100 mg/dL — AB
RBC / HPF: >50 RBC/hpf (ref 0–5)
Specific Gravity, Urine: 1.032 — ABNORMAL HIGH (ref 1.005–1.030)
pH: 6 (ref 5.0–8.0)

## 2022-12-18 LAB — PHOSPHORUS: Phosphorus: 2.7 mg/dL (ref 2.5–4.6)

## 2022-12-18 LAB — MAGNESIUM: Magnesium: 2.2 mg/dL (ref 1.7–2.4)

## 2022-12-18 LAB — GLUCOSE, CAPILLARY: Glucose-Capillary: 137 mg/dL — ABNORMAL HIGH (ref 70–99)

## 2022-12-18 LAB — CBG MONITORING, ED
Glucose-Capillary: 234 mg/dL — ABNORMAL HIGH (ref 70–99)
Glucose-Capillary: 247 mg/dL — ABNORMAL HIGH (ref 70–99)

## 2022-12-18 LAB — RESP PANEL BY RT-PCR (RSV, FLU A&B, COVID)  RVPGX2
Influenza A by PCR: NEGATIVE
Influenza B by PCR: NEGATIVE
Resp Syncytial Virus by PCR: NEGATIVE
SARS Coronavirus 2 by RT PCR: NEGATIVE

## 2022-12-18 LAB — I-STAT CG4 LACTIC ACID, ED: Lactic Acid, Venous: 0.3 mmol/L — ABNORMAL LOW (ref 0.5–1.9)

## 2022-12-18 LAB — MRSA NEXT GEN BY PCR, NASAL: MRSA by PCR Next Gen: NOT DETECTED

## 2022-12-18 LAB — HCG, SERUM, QUALITATIVE: Preg, Serum: NEGATIVE

## 2022-12-18 LAB — APTT: aPTT: 33 s (ref 24–36)

## 2022-12-18 LAB — PROTIME-INR
INR: 1.1 (ref 0.8–1.2)
Prothrombin Time: 14.2 s (ref 11.4–15.2)

## 2022-12-18 LAB — HIV ANTIBODY (ROUTINE TESTING W REFLEX): HIV Screen 4th Generation wRfx: NONREACTIVE

## 2022-12-18 MED ORDER — BUDESONIDE 0.5 MG/2ML IN SUSP
0.5000 mg | Freq: Two times a day (BID) | RESPIRATORY_TRACT | Status: DC
  Administered 2022-12-18 – 2022-12-20 (×5): 0.5 mg via RESPIRATORY_TRACT
  Filled 2022-12-18 (×5): qty 2

## 2022-12-18 MED ORDER — POTASSIUM CHLORIDE CRYS ER 10 MEQ PO TBCR
10.0000 meq | EXTENDED_RELEASE_TABLET | Freq: Every day | ORAL | Status: DC
  Administered 2022-12-18 – 2022-12-23 (×6): 10 meq via ORAL
  Filled 2022-12-18 (×12): qty 1

## 2022-12-18 MED ORDER — VANCOMYCIN HCL IN DEXTROSE 1-5 GM/200ML-% IV SOLN
1000.0000 mg | INTRAVENOUS | Status: DC
  Filled 2022-12-18: qty 200

## 2022-12-18 MED ORDER — ACETAMINOPHEN 500 MG PO TABS
1000.0000 mg | ORAL_TABLET | Freq: Once | ORAL | Status: AC
  Administered 2022-12-18: 1000 mg via ORAL
  Filled 2022-12-18: qty 2

## 2022-12-18 MED ORDER — GABAPENTIN 300 MG PO CAPS
600.0000 mg | ORAL_CAPSULE | Freq: Every day | ORAL | Status: DC
  Administered 2022-12-18 – 2022-12-22 (×5): 600 mg via ORAL
  Filled 2022-12-18 (×6): qty 2

## 2022-12-18 MED ORDER — ASPIRIN 81 MG PO TBEC
81.0000 mg | DELAYED_RELEASE_TABLET | Freq: Every day | ORAL | Status: DC
  Administered 2022-12-18 – 2022-12-23 (×6): 81 mg via ORAL
  Filled 2022-12-18 (×6): qty 1

## 2022-12-18 MED ORDER — VANCOMYCIN HCL IN DEXTROSE 1-5 GM/200ML-% IV SOLN: 1000.0000 mg | INTRAVENOUS | Status: DC

## 2022-12-18 MED ORDER — ENOXAPARIN SODIUM 40 MG/0.4ML IJ SOSY
40.0000 mg | PREFILLED_SYRINGE | INTRAMUSCULAR | Status: DC
  Administered 2022-12-18 – 2022-12-22 (×5): 40 mg via SUBCUTANEOUS
  Filled 2022-12-18 (×5): qty 0.4

## 2022-12-18 MED ORDER — SODIUM CHLORIDE 0.9 % IV SOLN
2.0000 g | Freq: Two times a day (BID) | INTRAVENOUS | Status: DC
  Administered 2022-12-18 – 2022-12-19 (×2): 2 g via INTRAVENOUS
  Filled 2022-12-18 (×2): qty 12.5

## 2022-12-18 MED ORDER — SODIUM CHLORIDE 0.9 % IV SOLN: INTRAVENOUS | Status: AC

## 2022-12-18 MED ORDER — CITALOPRAM HYDROBROMIDE 10 MG PO TABS
5.0000 mg | ORAL_TABLET | Freq: Every day | ORAL | Status: DC
  Administered 2022-12-18 – 2022-12-22 (×5): 5 mg via ORAL
  Filled 2022-12-18 (×6): qty 1

## 2022-12-18 MED ORDER — SACCHAROMYCES BOULARDII 250 MG PO CAPS
250.0000 mg | ORAL_CAPSULE | Freq: Every day | ORAL | Status: DC
  Administered 2022-12-19 – 2022-12-23 (×5): 250 mg via ORAL
  Filled 2022-12-18 (×5): qty 1

## 2022-12-18 MED ORDER — LEVOTHYROXINE SODIUM 50 MCG PO TABS
75.0000 ug | ORAL_TABLET | Freq: Every day | ORAL | Status: DC
  Administered 2022-12-19 – 2022-12-23 (×5): 75 ug via ORAL
  Filled 2022-12-18 (×5): qty 1

## 2022-12-18 MED ORDER — VANCOMYCIN HCL IN DEXTROSE 1-5 GM/200ML-% IV SOLN
1000.0000 mg | Freq: Once | INTRAVENOUS | Status: AC
  Administered 2022-12-18: 1000 mg via INTRAVENOUS
  Filled 2022-12-18: qty 200

## 2022-12-18 MED ORDER — ONDANSETRON HCL 4 MG PO TABS: 4.0000 mg | ORAL_TABLET | Freq: Four times a day (QID) | ORAL | Status: DC | PRN

## 2022-12-18 MED ORDER — LORATADINE 10 MG PO TABS: 10.0000 mg | ORAL_TABLET | Freq: Every day | ORAL | Status: DC | PRN

## 2022-12-18 MED ORDER — LACTATED RINGERS IV BOLUS
1000.0000 mL | Freq: Once | INTRAVENOUS | Status: AC
  Administered 2022-12-18: 1000 mL via INTRAVENOUS

## 2022-12-18 MED ORDER — IBUPROFEN 200 MG PO TABS
600.0000 mg | ORAL_TABLET | Freq: Once | ORAL | Status: AC
Start: 2022-12-18 — End: 2022-12-18
  Administered 2022-12-18: 600 mg via ORAL
  Filled 2022-12-18: qty 3

## 2022-12-18 MED ORDER — MAGNESIUM OXIDE -MG SUPPLEMENT 400 (240 MG) MG PO TABS
800.0000 mg | ORAL_TABLET | Freq: Every day | ORAL | Status: DC
  Administered 2022-12-18 – 2022-12-22 (×5): 800 mg via ORAL
  Filled 2022-12-18 (×5): qty 2

## 2022-12-18 MED ORDER — INSULIN PUMP
Freq: Three times a day (TID) | SUBCUTANEOUS | Status: DC
  Administered 2022-12-18: 1 via SUBCUTANEOUS
  Administered 2022-12-19: 6 via SUBCUTANEOUS
  Administered 2022-12-19: 4 via SUBCUTANEOUS
  Administered 2022-12-19: 6 via SUBCUTANEOUS
  Administered 2022-12-21: 7 via SUBCUTANEOUS
  Administered 2022-12-21: 5.8 via SUBCUTANEOUS
  Administered 2022-12-22: 1.75 via SUBCUTANEOUS
  Administered 2022-12-23: 2 via SUBCUTANEOUS
  Administered 2022-12-23: 3 via SUBCUTANEOUS
  Filled 2022-12-18: qty 1

## 2022-12-18 MED ORDER — SODIUM CHLORIDE 0.9 % IV BOLUS
1000.0000 mL | Freq: Once | INTRAVENOUS | Status: AC
  Administered 2022-12-18: 1000 mL via INTRAVENOUS

## 2022-12-18 MED ORDER — ALBUTEROL SULFATE (2.5 MG/3ML) 0.083% IN NEBU
2.5000 mg | INHALATION_SOLUTION | Freq: Two times a day (BID) | RESPIRATORY_TRACT | Status: DC
Start: 2022-12-18 — End: 2022-12-19
  Administered 2022-12-18 – 2022-12-19 (×3): 2.5 mg via RESPIRATORY_TRACT
  Filled 2022-12-18 (×3): qty 3

## 2022-12-18 MED ORDER — ACETAMINOPHEN 325 MG PO TABS
650.0000 mg | ORAL_TABLET | Freq: Four times a day (QID) | ORAL | Status: DC | PRN
  Administered 2022-12-18 – 2022-12-22 (×5): 650 mg via ORAL
  Filled 2022-12-18 (×5): qty 2

## 2022-12-18 MED ORDER — ONDANSETRON HCL 4 MG/2ML IJ SOLN
4.0000 mg | Freq: Four times a day (QID) | INTRAMUSCULAR | Status: DC | PRN
  Administered 2022-12-18 – 2022-12-22 (×2): 4 mg via INTRAVENOUS
  Filled 2022-12-18 (×2): qty 2

## 2022-12-18 MED ORDER — LUBIPROSTONE 8 MCG PO CAPS
16.0000 ug | ORAL_CAPSULE | Freq: Every day | ORAL | Status: DC
Start: 2022-12-19 — End: 2022-12-23
  Administered 2022-12-19 – 2022-12-23 (×5): 16 ug via ORAL
  Filled 2022-12-18 (×5): qty 2

## 2022-12-18 MED ORDER — ACETAMINOPHEN 650 MG RE SUPP: 650.0000 mg | Freq: Four times a day (QID) | RECTAL | Status: DC | PRN

## 2022-12-18 MED ORDER — PANTOPRAZOLE SODIUM 40 MG PO TBEC
40.0000 mg | DELAYED_RELEASE_TABLET | Freq: Every day | ORAL | Status: DC
  Administered 2022-12-18 – 2022-12-23 (×6): 40 mg via ORAL
  Filled 2022-12-18 (×6): qty 1

## 2022-12-18 MED ORDER — SODIUM CHLORIDE 0.9 % IV SOLN
2.0000 g | Freq: Once | INTRAVENOUS | Status: AC
  Administered 2022-12-18: 2 g via INTRAVENOUS
  Filled 2022-12-18: qty 12.5

## 2022-12-18 MED ORDER — IOHEXOL 350 MG/ML SOLN
75.0000 mL | Freq: Once | INTRAVENOUS | Status: AC | PRN
  Administered 2022-12-18: 75 mL via INTRAVENOUS

## 2022-12-18 MED ORDER — GABAPENTIN 300 MG PO CAPS
300.0000 mg | ORAL_CAPSULE | Freq: Every day | ORAL | Status: DC
  Administered 2022-12-19 – 2022-12-23 (×5): 300 mg via ORAL
  Filled 2022-12-18 (×5): qty 1

## 2022-12-18 MED ORDER — INSULIN ASPART 100 UNIT/ML IJ SOLN
0.0000 [IU] | Freq: Three times a day (TID) | INTRAMUSCULAR | Status: DC
  Filled 2022-12-18: qty 0.2

## 2022-12-18 MED ORDER — PRAVASTATIN SODIUM 20 MG PO TABS
20.0000 mg | ORAL_TABLET | Freq: Every day | ORAL | Status: DC
  Administered 2022-12-18 – 2022-12-22 (×5): 20 mg via ORAL
  Filled 2022-12-18 (×5): qty 1

## 2022-12-18 MED ORDER — ALBUTEROL SULFATE (2.5 MG/3ML) 0.083% IN NEBU: 2.5000 mg | INHALATION_SOLUTION | RESPIRATORY_TRACT | Status: DC | PRN

## 2022-12-18 MED ORDER — MONTELUKAST SODIUM 10 MG PO TABS
10.0000 mg | ORAL_TABLET | Freq: Every day | ORAL | Status: DC
  Administered 2022-12-18 – 2022-12-22 (×5): 10 mg via ORAL
  Filled 2022-12-18 (×6): qty 1

## 2022-12-18 NOTE — ED Triage Notes (Signed)
Pt to ED from home with c/o a cough intermittently over the past 2 weeks with fever reaching as high as 104 tonight with an SPO2 of 86%. Mother states pt was seen at the [facility] and prescribed antibiotics, however the fever has gotten progressively worse. Arrives A+O,  Arrives with an SPO2 of 87%RA, pt placed on 2lnc.

## 2022-12-18 NOTE — ED Notes (Signed)
Around 6:30, the patient's family reported to staff that the insulin pump administered a bolus of 3.33 u.

## 2022-12-18 NOTE — ED Provider Notes (Cosign Needed Addendum)
  Physical Exam  BP 119/76   Pulse (!) 108   Temp (!) 101.5 F (38.6 C) (Axillary)   Resp 15   Wt 86.9 kg   SpO2 96%   BMI 33.94 kg/m   Physical Exam  Procedures  Procedures  ED Course / MDM    Medical Decision Making Amount and/or Complexity of Data Reviewed Labs: ordered. Radiology: ordered. ECG/medicine tests: ordered.  Risk OTC drugs. Prescription drug management. Decision regarding hospitalization.   H/o brain CA and stroke Per parents, cough. Thursday - augmentin Fever yesterday - zith added Tonight 86% - not on CPAP - fever to 104 Lactic neg, CXR neg for PNA, no leukocytosis Pending viral panel If negative, consider CTA-chest  Viral panel negative.  Discussed patient's condition with mom. Plan for admission - possible CTA, will discuss with admitting doctor.  Discussed the patient with Dr. Robb Matar who accepts for admission. Will order CTA r/o PE.        Elpidio Anis, PA-C 12/18/22 0630    Elpidio Anis, PA-C 12/18/22 0834    Sloan Leiter, DO 12/22/22 [phone]

## 2022-12-18 NOTE — ED Provider Notes (Signed)
[place] EMERGENCY DEPARTMENT AT [facility] Provider Note   CSN: [id number] Arrival date & time: 12/18/22  0410     History  Chief Complaint  Patient presents with   Fever   Cough    Tracey Perkins is a 37 y.o. female.  37 year old female brought in by parents from home with concern for fever and cough. Patient with history of brain cancer as a child, TBI, CVA, mild asthma as a child, hard of hearing, OSA (did not wear CPAP tonight). Cough for the past few weeks worse (deeper) on Thursday, went to UC who started on Augmentin which patient started taking on Thursday. Fever onset Saturday, call to PCP who sent in Zithromax and patient started on Saturday. Mom woke up at 3am to check on patient and noted temp 104 with O2 86% which prompted ER visit. No recent antipyretics. Patient denies difficulty breathing, abdominal pain, vomiting, no other complaints or concerns.        Home Medications Prior to Admission medications   Medication Sig Start Date End Date Taking? Authorizing Provider  acetaminophen (TYLENOL) 500 MG tablet Take 500 mg by mouth as needed for mild pain or headache.   Yes [provider]  amoxicillin-clavulanate (AUGMENTIN) 875-125 MG tablet Take 1 tablet by mouth 2 (two) times daily. 07/29/22 12/25/22 Yes [provider]  aspirin EC 81 MG tablet Take 81 mg by mouth daily. Swallow whole.   Yes [provider]  azithromycin (ZITHROMAX) 250 MG tablet Take 250 mg by mouth See admin instructions. take 2 tablets first day and then one tablet for the next four days Orally daily for 5 days 12/17/22  Yes [provider]  citalopram (CELEXA) 10 MG tablet Take 0.5 tablets (5 mg total) by mouth at bedtime. 11/29/22 01/28/23 Yes Park Pope, MD  Continuous Blood Gluc Sensor (DEXCOM G6 SENSOR) MISC Apply 1 Device topically See admin instructions. Place 1 sensor into the skin every 10 days Dexcom 7 06/03/21  Yes [provider]   DERMOTIC 0.01 % OIL Place 4 drops into both ears See admin instructions. Place 4 drops into both ears 2 times a week as needed/as directed 05/15/20  Yes [provider]  dicyclomine (BENTYL) 20 MG tablet Take 1 tablet (20 mg total) by mouth 2 (two) times daily as needed for up to 7 days for spasms. 10/14/22 12/18/22 Yes Tanda Rockers A, DO  EPINEPHRINE 0.3 mg/0.3 mL IJ SOAJ injection Use as directed 04/12/22  Yes Ambs, Norvel Richards, FNP  FLORASTOR 250 MG capsule Take 250 mg by mouth in the morning.   Yes [provider]  fluticasone (FLONASE) 50 MCG/ACT nasal spray 2 sprays per nostril daily as needed for stuffy nose. 04/12/22  Yes Ambs, Norvel Richards, FNP  fluticasone (FLOVENT HFA) 110 MCG/ACT inhaler Inhale 2 puffs into the lungs 2 (two) times daily as needed (for asthma flares, until cough and wheeze-free). Patient taking differently: Inhale 2 puffs into the lungs every 30 (thirty) days. 07/14/21  Yes Glade Lloyd, MD  gabapentin (NEURONTIN) 300 MG capsule Take 300-600 mg by mouth daily. 300 MG in AM and 600 MG in PM 09/22/16 06/10/23 Yes [provider]  Glucagon 3 MG/DOSE POWD Place 3 mg into the nose as needed (low blood sugar).   Yes [provider]  insulin aspart (NOVOLOG) 100 UNIT/ML injection Inject into the skin See admin instructions. Up to 190 units a day per pump 12/24/19  Yes [provider]  LANTUS SOLOSTAR 100 UNIT/ML Solostar Pen as needed (uses onlyl if her insulin pump fails).   Yes [provider]  levothyroxine (SYNTHROID) 75 MCG tablet Take 75 mcg by mouth daily before breakfast. 04/05/21  Yes [provider]  loperamide (IMODIUM) 2 MG capsule Take 1 capsule (2 mg total) by mouth 4 (four) times daily as needed for diarrhea or loose stools. 10/14/22  Yes Tanda Rockers A, DO  loratadine (CLARITIN) 10 MG tablet Take 1 tablet (10 mg total) by mouth daily as needed for allergies. 04/12/22  Yes Ambs, Norvel Richards, FNP  lubiprostone (AMITIZA) 8 MCG  capsule Take 16 mcg by mouth daily with breakfast.    Yes [provider]  magnesium oxide (MAG-OX) 400 (240 Mg) MG tablet Take 800 mg by mouth at bedtime.   Yes [provider]  meclizine (ANTIVERT) 25 MG tablet as needed. 06/19/19  Yes [provider]  montelukast (SINGULAIR) 10 MG tablet 1 tablet by mouth at bedtime to prevent coughing or wheezing. 11/22/22  Yes Ambs, Norvel Richards, FNP  Olopatadine HCl 0.6 % SOLN Place 2 sprays into both nostrils 2 (two) times daily. Patient taking differently: Place 2 sprays into both nostrils 2 (two) times daily as needed. 06/24/22  Yes Ambs, Norvel Richards, FNP  ondansetron (ZOFRAN) 4 MG tablet Take 1 tablet (4 mg total) by mouth every 4 (four) hours as needed for nausea or vomiting. 10/14/22  Yes Tanda Rockers A, DO  pantoprazole (PROTONIX) 20 MG tablet Take 1 tablet (20 mg total) by mouth daily. Patient taking differently: Take 20 mg by mouth as needed. 04/12/22 04/12/23 Yes Ambs, Norvel Richards, FNP  Polyethyl Glycol-Propyl Glycol (SYSTANE OP) Place 1 drop into both eyes 3 (three) times daily as needed (for dryness).   Yes [provider]  potassium chloride (KLOR-CON) 10 MEQ tablet Take 10 mEq by mouth daily. 03/23/21  Yes [provider]  pravastatin (PRAVACHOL) 20 MG tablet Take 20 mg by mouth daily after supper.   Yes [provider]  PROAIR HFA 108 (90 Base) MCG/ACT inhaler Inhale 2 puffs into the lungs every 4 (four) hours as needed for wheezing or shortness of breath. TAKE 2 PUFFS BY MOUTH EVERY 4 HOURS AS NEEDED Strength: 108 (90 Base) MCG/ACT Patient taking differently: Inhale 2 puffs into the lungs every 4 (four) hours as needed for wheezing or shortness of breath. 06/09/21  Yes Ambs, Norvel Richards, FNP  VENTOLIN HFA 108 (90 Base) MCG/ACT inhaler Inhale 2 puffs into the lungs every 4 (four) hours as needed for wheezing or shortness of breath. 04/12/22  Yes Ambs, Norvel Richards, FNP  Continuous Blood Gluc Transmit (DEXCOM G6 TRANSMITTER) MISC  Dexcom 7 01/26/21   [provider]      Allergies    Banana, Bactrim [sulfamethoxazole-trimethoprim], Doxycycline, Gentian violet, Gentian violet-proflavine sulfate [triple dye], Mold extract [trichophyton], Morphine and codeine, Other, Sulfa antibiotics, and Vancomycin    Review of Systems   Review of Systems Negative except as per HPI Physical Exam Updated Vital Signs BP 119/76   Pulse (!) 108   Temp (!) 101.5 F (38.6 C) (Axillary)   Resp 15   Wt 86.9 kg   SpO2 96%   BMI 33.94 kg/m  Physical Exam Vitals and nursing note reviewed.  Constitutional:      General: She is not in acute distress.    Appearance: She is well-developed. She is not diaphoretic.  HENT:     Head: Normocephalic and atraumatic.  Mouth/Throat:     Mouth: Mucous membranes are moist.  Cardiovascular:     Rate and Rhythm: Regular rhythm. Tachycardia present.     Pulses: Normal pulses.     Heart sounds: Normal heart sounds.  Pulmonary:     Effort: Pulmonary effort is normal.     Breath sounds: Decreased air movement present. Examination of the right-lower field reveals decreased breath sounds. Examination of the left-lower field reveals decreased breath sounds. Decreased breath sounds present. No wheezing.  Abdominal:     Palpations: Abdomen is soft.     Tenderness: There is no abdominal tenderness.  Musculoskeletal:     Right lower leg: No edema.     Left lower leg: No edema.  Skin:    General: Skin is warm and dry.     Findings: No erythema or rash.  Neurological:     Mental Status: She is alert and oriented to person, place, and time.  Psychiatric:        Behavior: Behavior normal.     ED Results / Procedures / Treatments   Labs (all labs ordered are listed, but only abnormal results are displayed) Labs Reviewed  COMPREHENSIVE METABOLIC PANEL - Abnormal; Notable for the following components:      Result Value   Sodium 134 (*)    Chloride 97 (*)    Glucose, Bld 158 (*)     Creatinine, Ser 1.40 (*)    Calcium 8.5 (*)    GFR, Estimated 50 (*)    All other components within normal limits  I-STAT CG4 LACTIC ACID, ED - Abnormal; Notable for the following components:   Lactic Acid, Venous 0.3 (*)    All other components within normal limits  CULTURE, BLOOD (ROUTINE X 2)  CULTURE, BLOOD (ROUTINE X 2)  RESP PANEL BY RT-PCR (RSV, FLU A&B, COVID)  [id number]  CBC WITH DIFFERENTIAL/PLATELET  PROTIME-INR  APTT  HCG, SERUM, QUALITATIVE  URINALYSIS, W/ REFLEX TO CULTURE (INFECTION SUSPECTED)    EKG EKG Interpretation Date/Time:  Sunday December 18 2022 04:26:07 EST Ventricular Rate:  121 PR Interval:  152 QRS Duration:  77 QT Interval:  294 QTC Calculation: 418 R Axis:   26  Text Interpretation: Sinus tachycardia Low voltage, precordial leads Confirmed by Nicanor Alcon, April ([id number]) on 12/18/2022 4:45:10 AM  Radiology DG Chest Port 1 View  Result Date: 12/18/2022 CLINICAL DATA:  Questionable sepsis EXAM: PORTABLE CHEST 1 VIEW COMPARISON:  12/12/2021 FINDINGS: Low volume chest. There is no edema, consolidation, effusion, or pneumothorax. Normal heart size and mediastinal contours. Artifact from EKG leads. IMPRESSION: Low volume chest without pneumonia. Electronically Signed   By: Tiburcio Pea M.D.   On: 12/18/2022 05:23    Procedures .Critical Care  Performed by: Jeannie Fend, PA-C Authorized by: Jeannie Fend, PA-C   Critical care provider statement:    Critical care time (minutes):  30   Critical care was time spent personally by me on the following activities:  Development of treatment plan with patient or surrogate, discussions with consultants, evaluation of patient's response to treatment, examination of patient, ordering and review of laboratory studies, ordering and review of radiographic studies, ordering and performing treatments and interventions, pulse oximetry, re-evaluation of patient's condition and review of old charts     Medications  Ordered in ED Medications  vancomycin (VANCOCIN) IVPB 1000 mg/200 mL premix (1,000 mg Intravenous New Bag/Given 12/18/22 0545)  ceFEPIme (MAXIPIME) 2 g in sodium chloride 0.9 % 100 mL IVPB (has no administration in  time range)  vancomycin (VANCOCIN) IVPB 1000 mg/200 mL premix (has no administration in time range)  ibuprofen (ADVIL) tablet 600 mg (has no administration in time range)  acetaminophen (TYLENOL) tablet 1,000 mg (1,000 mg Oral Given 12/18/22 0500)  ceFEPIme (MAXIPIME) 2 g in sodium chloride 0.9 % 100 mL IVPB (0 g Intravenous Stopped 12/18/22 0601)  sodium chloride 0.9 % bolus 1,000 mL (1,000 mLs Intravenous New Bag/Given 12/18/22 0601)    ED Course/ Medical Decision Making/ A&P                                 Medical Decision Making Amount and/or Complexity of Data Reviewed Labs: ordered. Radiology: ordered. ECG/medicine tests: ordered.  Risk OTC drugs. Prescription drug management.   This patient presents to the ED for concern of fever, cough, low O2, this involves an extensive number of treatment options, and is a complaint that carries with it a high risk of complications and morbidity.  The differential diagnosis includes but not limited to PNA, sepsis, pneumothorax, covid/flu/rsv, UTI   Co morbidities that complicate the patient evaluation  DM, asthma, CVA, TBI, hard of hearing   Additional history obtained:  Additional history obtained from parents at bedside who assist with history as above External records from outside source obtained and reviewed including UC visit with rx for Augmentin    Lab Tests:  I Ordered, and personally interpreted labs.  The pertinent results include: Lactic acid 0.3.  CMP with elevated creatinine 1.4 compared to prior on file.  hCG negative.  INR normal.  CBC within normal is.  Urinalysis and viral swab pending at time of signout to oncoming provider.   Imaging Studies ordered:  I ordered imaging studies including chest x-ray I  independently visualized and interpreted imaging which showed no acute abnormality I agree with the radiologist interpretation   Cardiac Monitoring: / EKG:  The patient was maintained on a cardiac monitor.  I personally viewed and interpreted the cardiac monitored which showed an underlying rhythm of: Sinus tachycardia, rate 121   Problem List / ED Course / Critical interventions / Medication management  37 year old female presents with concern for low O2 of 86% at home with fever of 104.  Patient is on Augmentin and Zithromax for fever and cough, presumed pneumonia.  On exam, patient able to provide some history although difficult due to hard of hearing state, history augmented by parents. Lungs diminished in bases, CXR negative for PNA. CBC WNL. Lactic reassuring. Care signed out at change of shift pending viral swab. Consider PE study if negative. Likely admission.  I ordered medication including Tylenol, antibiotics, IV fluids for fever, sepsis protocol Reevaluation of the patient after these medicines showed that the patient  fever improved, remains tachycardic.  O2 sats 95% on nasal cannula. I have reviewed the patients home medicines and have made adjustments as needed   Social Determinants of Health:  Lives with parents   Test / Admission - Considered:  Disposition pending at time of signout during provider         Final Clinical Impression(s) / ED Diagnoses Final diagnoses:  Fever, unspecified fever cause  Hypoxia    Rx / DC Orders ED Discharge Orders     None         Jeannie Fend, PA-C 12/18/22 0640    Palumbo, April, MD 12/18/22 [phone]

## 2022-12-18 NOTE — Progress Notes (Signed)
   12/18/22 2142  BiPAP/CPAP/SIPAP  BiPAP/CPAP/SIPAP Pt Type Adult  BiPAP/CPAP/SIPAP Resmed  Mask Type Nasal mask  Mask Size Small  FiO2 (%) 21 %  Patient Home Equipment No  Auto Titrate Yes (4-15)

## 2022-12-18 NOTE — H&P (Addendum)
History and Physical    Patient: Tracey Perkins FAO:[id number] DOB: 23-Jan-1985 DOA: 12/18/2022 DOS: the patient was seen and examined on 12/18/2022 PCP: Chilton Greathouse, MD  Patient coming from: SNF  Chief Complaint:  Chief Complaint  Patient presents with   Fever   Cough   HPI: Tracey Perkins is a 37 y.o. female with medical history significant of asthma, brain tumor as a child medulloblastoma, history of TBI as an adult, history of CVA, mild cognitive impairment, postconcussion syndrome, vision changes, type 1 diabetes, diabetic gastroparesis, intractable nausea and vomiting, diabetic peripheral neuropathy, eczema, seasonal allergies, food allergy, hypokalemia, hypomagnesemia, hypothyroidism, OSA on CPAP who has been having cough for the past 2 weeks associated with wheezing, progressively worse dyspnea and productive cough of greenish sputum.  She was taken to the urgent care on Thursday started on Augmentin, developed a fever yesterday (Saturday) and PCP was called, a Zithromax was added to her regimen.  Today, her mother woke up, she went to check on her and found her to be febrile with a fever of 104 F and her O2 sat was 86%.  She was not wearing her CPAP last night.  No chest pain, palpitations, abdominal pain, nausea, emesis, diarrhea, melena or hematochezia.  She gets frequently constipated.  No dysuria, frequency or hematuria.  Lab work: CBC, PT/INR/PTT, magnesium and phosphorus were normal.  Negative coronavirus, influenza and RSV PCR.  Lactic acid 0.3 mmol/L.  Negative serum pregnancy test.  CMP showed a 734, chloride 97 mmol/L.  Glucose 158, creatinine 1.40 and calcium 8.5 mg/dL.  The rest of the CMP measurements were normal.  Imaging: Portable 1 view chest radiograph with low volume chest with solid pneumonia.  Normal heart size and mediastinal contours.  CTA chest no evidence of PE.  Bilateral upper lobe and right middle lobe infiltrates, likely due to pneumonia.  Mild bilateral  hilar and mediastinal lymphadenopathy, nonspecific but likely reactive in etiology.  Chest CT follow-up recommended in 3 months   ED course: Initial vital signs were temperature 102.9 F, pulse 124, respiration 18, BP 136/86 mmHg O2 sat 90% on room air.  Patient received supplemental oxygen, normal saline 1000 mL bolus, ibuprofen 600 mg p.o. x 1, acetaminophen 1000 mg p.o., was started on cefepime and vancomycin.  I added LR 1 L bolus, followed by NS at 125 mL/h.  Review of Systems: As mentioned in the history of present illness. All other systems reviewed and are negative. Past Medical History:  Diagnosis Date   Asthma    Brain tumor (HCC)    Diabetes mellitus without complication (HCC)    Eczema    Food allergy    Gastroparesis    Hypokalemia    Hypomagnesemia    Hypothyroid    Neuropathy    Post concussion syndrome 05/2019   Vision changes    Past Surgical History:  Procedure Laterality Date   BRAIN SURGERY     Portacath placement and removed     WISDOM TOOTH EXTRACTION     Social History:  reports that she has never smoked. She has never used smokeless tobacco. She reports that she does not drink alcohol and does not use drugs.  Allergies  Allergen Reactions   Banana Swelling and Other (See Comments)    Mouth burning and swollen   Bactrim [Sulfamethoxazole-Trimethoprim] Hives   Doxycycline Hives   Gentian Violet Other (See Comments)    Mouth burns   Gentian Violet-Proflavine Sulfate [Triple Dye] Other (See Comments)  Mouth burns   Mold Extract [Trichophyton] Other (See Comments)    Hay Fever   Morphine And Codeine Hives   Other Hives, Itching and Other (See Comments)    "Hay fever, dust and pollen."  Per patient.- itching, runny nose   Sulfa Antibiotics    Vancomycin Other (See Comments)    Red man syndrome     Family History  Problem Relation Age of Onset   Thyroid disease Mother    Sleep apnea Father    Eczema Sister    Thyroid disease Sister    Thyroid  disease Sister     Prior to Admission medications   Medication Sig Start Date End Date Taking? Authorizing Provider  acetaminophen (TYLENOL) 500 MG tablet Take 500 mg by mouth as needed for mild pain or headache.   Yes [provider]  amoxicillin-clavulanate (AUGMENTIN) 875-125 MG tablet Take 1 tablet by mouth 2 (two) times daily. 07/29/22 12/25/22 Yes [provider]  aspirin EC 81 MG tablet Take 81 mg by mouth daily. Swallow whole.   Yes [provider]  azithromycin (ZITHROMAX) 250 MG tablet Take 250 mg by mouth See admin instructions. take 2 tablets first day and then one tablet for the next four days Orally daily for 5 days 12/17/22  Yes [provider]  citalopram (CELEXA) 10 MG tablet Take 0.5 tablets (5 mg total) by mouth at bedtime. 11/29/22 01/28/23 Yes Park Pope, MD  Continuous Blood Gluc Sensor (DEXCOM G6 SENSOR) MISC Apply 1 Device topically See admin instructions. Place 1 sensor into the skin every 10 days Dexcom 7 06/03/21  Yes [provider]  DERMOTIC 0.01 % OIL Place 4 drops into both ears See admin instructions. Place 4 drops into both ears 2 times a week as needed/as directed 05/15/20  Yes [provider]  dicyclomine (BENTYL) 20 MG tablet Take 1 tablet (20 mg total) by mouth 2 (two) times daily as needed for up to 7 days for spasms. 10/14/22 12/18/22 Yes Tanda Rockers A, DO  EPINEPHRINE 0.3 mg/0.3 mL IJ SOAJ injection Use as directed 04/12/22  Yes Ambs, Norvel Richards, FNP  FLORASTOR 250 MG capsule Take 250 mg by mouth in the morning.   Yes [provider]  fluticasone (FLONASE) 50 MCG/ACT nasal spray 2 sprays per nostril daily as needed for stuffy nose. 04/12/22  Yes Ambs, Norvel Richards, FNP  fluticasone (FLOVENT HFA) 110 MCG/ACT inhaler Inhale 2 puffs into the lungs 2 (two) times daily as needed (for asthma flares, until cough and wheeze-free). Patient taking differently: Inhale 2 puffs into the lungs every 30 (thirty) days. 07/14/21   Yes Glade Lloyd, MD  gabapentin (NEURONTIN) 300 MG capsule Take 300-600 mg by mouth daily. 300 MG in AM and 600 MG in PM 09/22/16 06/10/23 Yes [provider]  Glucagon 3 MG/DOSE POWD Place 3 mg into the nose as needed (low blood sugar).   Yes [provider]  insulin aspart (NOVOLOG) 100 UNIT/ML injection Inject into the skin See admin instructions. Up to 190 units a day per pump 12/24/19  Yes [provider]  LANTUS SOLOSTAR 100 UNIT/ML Solostar Pen as needed (uses onlyl if her insulin pump fails).   Yes [provider]  levothyroxine (SYNTHROID) 75 MCG tablet Take 75 mcg by mouth daily before breakfast. 04/05/21  Yes [provider]  loperamide (IMODIUM) 2 MG capsule Take 1 capsule (2 mg total) by mouth 4 (four) times daily as needed for diarrhea or loose stools. 10/14/22  Yes Tanda Rockers A, DO  loratadine (CLARITIN) 10 MG tablet Take 1 tablet (10 mg total) by mouth daily as needed for allergies. 04/12/22  Yes Ambs, Norvel Richards, FNP  lubiprostone (AMITIZA) 8 MCG capsule Take 16 mcg by mouth daily with breakfast.    Yes [provider]  magnesium oxide (MAG-OX) 400 (240 Mg) MG tablet Take 800 mg by mouth at bedtime.   Yes [provider]  meclizine (ANTIVERT) 25 MG tablet as needed. 06/19/19  Yes [provider]  montelukast (SINGULAIR) 10 MG tablet 1 tablet by mouth at bedtime to prevent coughing or wheezing. 11/22/22  Yes Ambs, Norvel Richards, FNP  Olopatadine HCl 0.6 % SOLN Place 2 sprays into both nostrils 2 (two) times daily. Patient taking differently: Place 2 sprays into both nostrils 2 (two) times daily as needed. 06/24/22  Yes Ambs, Norvel Richards, FNP  ondansetron (ZOFRAN) 4 MG tablet Take 1 tablet (4 mg total) by mouth every 4 (four) hours as needed for nausea or vomiting. 10/14/22  Yes Tanda Rockers A, DO  pantoprazole (PROTONIX) 20 MG tablet Take 1 tablet (20 mg total) by mouth daily. Patient taking differently: Take 20 mg by mouth as needed.  04/12/22 04/12/23 Yes Ambs, Norvel Richards, FNP  Polyethyl Glycol-Propyl Glycol (SYSTANE OP) Place 1 drop into both eyes 3 (three) times daily as needed (for dryness).   Yes [provider]  potassium chloride (KLOR-CON) 10 MEQ tablet Take 10 mEq by mouth daily. 03/23/21  Yes [provider]  pravastatin (PRAVACHOL) 20 MG tablet Take 20 mg by mouth daily after supper.   Yes [provider]  PROAIR HFA 108 (90 Base) MCG/ACT inhaler Inhale 2 puffs into the lungs every 4 (four) hours as needed for wheezing or shortness of breath. TAKE 2 PUFFS BY MOUTH EVERY 4 HOURS AS NEEDED Strength: 108 (90 Base) MCG/ACT Patient taking differently: Inhale 2 puffs into the lungs every 4 (four) hours as needed for wheezing or shortness of breath. 06/09/21  Yes Ambs, Norvel Richards, FNP  VENTOLIN HFA 108 (90 Base) MCG/ACT inhaler Inhale 2 puffs into the lungs every 4 (four) hours as needed for wheezing or shortness of breath. 04/12/22  Yes Ambs, Norvel Richards, FNP  Continuous Blood Gluc Transmit (DEXCOM G6 TRANSMITTER) MISC Dexcom 7 01/26/21   [provider]    Physical Exam: Vitals:   12/18/22 0630 12/18/22 0634 12/18/22 0715 12/18/22 0808  BP: 119/76  102/67 102/71  Pulse: (!) 108  (!) 103 95  Resp: 15  20 10   Temp:  (!) 101.5 F (38.6 C) 100.1 F (37.8 C)   TempSrc:  Axillary Oral   SpO2: 96%  97% 97%  Weight:       Physical Exam Vitals and nursing note reviewed.  Constitutional:      General: She is awake. She is not in acute distress.    Appearance: Normal appearance. She is obese. She is ill-appearing.     Interventions: Nasal cannula in place.  HENT:     Head: Normocephalic.     Nose: No rhinorrhea.     Mouth/Throat:     Mouth: Mucous membranes are moist.  Eyes:     General: No scleral icterus.    Pupils: Pupils are equal, round, and reactive to light.  Neck:     Vascular: No JVD.  Cardiovascular:     Rate and Rhythm: Normal rate and regular rhythm.     Heart sounds: S1 normal and  S2 normal.  Pulmonary:  Effort: No accessory muscle usage or respiratory distress.     Breath sounds: Wheezing, rhonchi and rales present.  Abdominal:     General: Bowel sounds are normal. There is no distension.     Palpations: Abdomen is soft.     Tenderness: There is no abdominal tenderness.  Musculoskeletal:     Cervical back: Neck supple.     Right lower leg: No edema.     Left lower leg: No edema.  Skin:    General: Skin is warm and dry.  Neurological:     General: No focal deficit present.     Mental Status: She is alert and oriented to person, place, and time.  Psychiatric:        Mood and Affect: Mood normal.        Behavior: Behavior normal. Behavior is cooperative.   Data Reviewed:  Results are pending, will review when available.  09/03/2020 TTE. IMPRESSIONS:   1. Left ventricular ejection fraction, by estimation, is 55 to 60%. The  left ventricle has normal function. The left ventricle has no regional  wall motion abnormalities. Left ventricular diastolic parameters were  normal.   2. Right ventricular systolic function is normal. The right ventricular  size is normal. Tricuspid regurgitation signal is inadequate for assessing  PA pressure.   3. The mitral valve is normal in structure. No evidence of mitral valve  regurgitation.   4. The aortic valve was not well visualized. Aortic valve regurgitation  is not visualized. No aortic stenosis is present.   5. The inferior vena cava is normal in size with greater than 50%  respiratory variability, suggesting right atrial pressure of 3 mmHg.   EKG: Vent. rate 121 BPM PR interval 152 ms QRS duration 77 ms QT/QTcB 294/418 ms P-R-T axes 56 26 14 Sinus tachycardia Low voltage, precordial leads  Assessment and Plan: Principal Problem:   Acute respiratory failure with hypoxia (HCC) In the setting of:   Multifocal pneumonia Complicated by:   Mild intermittent asthma with acute exacerbation Admit to  PCU/inpatient. Continue IV fluids. Continue supplemental oxygen. Scheduled and as needed bronchodilators. Continue cefepime 2 g every 8 hours.   Continue metronidazole 500 mg IVPB q 12 hr. Vancomycin may have caused "red man" syndrome -Will discuss with pharmacy for alternatives. Check sputum Gram stain, culture and sensitivity. Check strep pneumoniae urinary antigen. Follow-up blood culture and sensitivity Follow CBC and CMP in a.m.  Active Problems:   Type 1 diabetes (HCC)   Insulin pump status Carbohydrate modified diet. Patient has an insulin pump. CBG monitoring per insulin pump protocol. Check hemoglobin A1c.    Diabetic neuropathy (HCC) Continue gabapentin per home schedule.    Class 1 obesity Current BMI 33.94 kg/m. Lifestyle modifications. Follow-up closely with PCP and/or endocrinology.    Gastroesophageal reflux disease Continue pantoprazole 40 mg p.o. daily while in the hospital. May continue as needed at home.    Chronic lymphocytic thyroiditis   Hypothyroidism Continue levothyroxine 75 mcg p.o. daily.    Anxiety   Mild episode of recurrent major depressive disorder (HCC) Continue escitalopram 10 mg p.o. daily.    Hyperlipidemia Continue pravastatin 20 mg p.o. daily.    Mild cognitive impairment History of multiple CNS insults. Supportive care as needed.    Advance Care Planning:   Code Status: Full Code   Consults:   Family Communication: Her mother was at bedside.  Severity of Illness: The appropriate patient status for this patient is INPATIENT. Inpatient status is judged  to be reasonable and necessary in order to provide the required intensity of service to ensure the patient's safety. The patient's presenting symptoms, physical exam findings, and initial radiographic and laboratory data in the context of their chronic comorbidities is felt to place them at high risk for further clinical deterioration. Furthermore, it is not anticipated that  the patient will be medically stable for discharge from the hospital within 2 midnights of admission.   * I certify that at the point of admission it is my clinical judgment that the patient will require inpatient hospital care spanning beyond 2 midnights from the point of admission due to high intensity of service, high risk for further deterioration and high frequency of surveillance required.*  Author: Bobette Mo, MD 12/18/2022 8:29 AM  For on call review [email or web address].   This document was prepared using Dragon voice recognition software and may contain some unintended transcription errors.

## 2022-12-18 NOTE — Progress Notes (Signed)
TRH admitting physician addendum:  The patient may have gotten "red man" syndrome while the vancomycin was infusing.  We discussed that with pharmacy.  MRSA PCR still pending, but if the patient still needs vancomycin he can be infused at a much slower rate with close monitoring.  Sanda Klein, MD.

## 2022-12-18 NOTE — Progress Notes (Signed)
Pharmacy Antibiotic Note  Tracey Perkins is a 37 y.o. female admitted on 12/18/2022 with concern for fever and cough. Patient with history of brain cancer as a child, TBI, CVA, mild asthma as a child, hard of hearing, OSA .  Pharmacy has been consulted to dose vanc and cefepime for pna.  Plan: Vancomycin 1gm x 1 then 1gm q24h (AUC 541, Scr 1.4) Cefepime 2gm q12h Follow renal function, cultures and clinical course  Weight: 86.9 kg (191 lb 9.3 oz)  Temp (24hrs), Avg:102.9 F (39.4 C), Min:102.9 F (39.4 C), Max:102.9 F (39.4 C)  Recent Labs  Lab 12/18/22 0443  WBC 7.9  CREATININE 1.40*    Estimated Creatinine Clearance: 58.1 mL/min (A) (by C-G formula based on SCr of 1.4 mg/dL (H)).    Allergies  Allergen Reactions   Banana Swelling and Other (See Comments)    Mouth burning and swollen   Bactrim [Sulfamethoxazole-Trimethoprim] Hives   Doxycycline Hives   Gentian Violet Other (See Comments)    Mouth burns   Gentian Violet-Proflavine Sulfate [Triple Dye] Other (See Comments)    Mouth burns   Mold Extract [Trichophyton] Other (See Comments)    Hay Fever   Morphine And Codeine Hives   Other Hives, Itching and Other (See Comments)    "Hay fever, dust and pollen."  Per patient.- itching, runny nose   Sulfa Antibiotics    Vancomycin Other (See Comments)    Red man syndrome     Antimicrobials this admission: 12/1 cefepime >> 12/1 vanc >>  Dose adjustments this admission:   Microbiology results: 12/1 BCx:   Thank you for allowing pharmacy to be a part of this patient's care.  Arley Phenix RPh 12/18/2022, 5:34 AM

## 2022-12-19 DIAGNOSIS — J9601 Acute respiratory failure with hypoxia: Secondary | ICD-10-CM | POA: Diagnosis not present

## 2022-12-19 LAB — GLUCOSE, CAPILLARY
Glucose-Capillary: 116 mg/dL — ABNORMAL HIGH (ref 70–99)
Glucose-Capillary: 105 mg/dL — ABNORMAL HIGH (ref 70–99)
Glucose-Capillary: 172 mg/dL — ABNORMAL HIGH (ref 70–99)
Glucose-Capillary: 269 mg/dL — ABNORMAL HIGH (ref 70–99)
Glucose-Capillary: 234 mg/dL — ABNORMAL HIGH (ref 70–99)
Glucose-Capillary: 256 mg/dL — ABNORMAL HIGH (ref 70–99)
Glucose-Capillary: 192 mg/dL — ABNORMAL HIGH (ref 70–99)

## 2022-12-19 LAB — CBC
HCT: 29.4 % — ABNORMAL LOW (ref 36.0–46.0)
Hemoglobin: 9.8 g/dL — ABNORMAL LOW (ref 12.0–15.0)
MCH: 30.9 pg (ref 26.0–34.0)
MCHC: 33.3 g/dL (ref 30.0–36.0)
MCV: 92.7 fL (ref 80.0–100.0)
Platelets: 208 10*3/uL (ref 150–400)
RBC: 3.17 MIL/uL — ABNORMAL LOW (ref 3.87–5.11)
RDW: 13.4 % (ref 11.5–15.5)
WBC: 5.8 10*3/uL (ref 4.0–10.5)
nRBC: 0 % (ref 0.0–0.2)

## 2022-12-19 LAB — COMPREHENSIVE METABOLIC PANEL
ALT: 41 U/L (ref 0–44)
AST: 56 U/L — ABNORMAL HIGH (ref 15–41)
Albumin: 3 g/dL — ABNORMAL LOW (ref 3.5–5.0)
Alkaline Phosphatase: 85 U/L (ref 38–126)
Anion gap: 7 (ref 5–15)
BUN: 15 mg/dL (ref 6–20)
CO2: 24 mmol/L (ref 22–32)
Calcium: 7.5 mg/dL — ABNORMAL LOW (ref 8.9–10.3)
Chloride: 105 mmol/L (ref 98–111)
Creatinine, Ser: 1.23 mg/dL — ABNORMAL HIGH (ref 0.44–1.00)
GFR, Estimated: 58 mL/min — ABNORMAL LOW (ref >60)
Glucose, Bld: 136 mg/dL — ABNORMAL HIGH (ref 70–99)
Potassium: 3.9 mmol/L (ref 3.5–5.1)
Sodium: 136 mmol/L (ref 135–145)
Total Bilirubin: 0.2 mg/dL (ref <1.2)
Total Protein: 6 g/dL — ABNORMAL LOW (ref 6.5–8.1)

## 2022-12-19 MED ORDER — ALBUTEROL SULFATE (2.5 MG/3ML) 0.083% IN NEBU
2.5000 mg | INHALATION_SOLUTION | Freq: Two times a day (BID) | RESPIRATORY_TRACT | Status: DC
  Administered 2022-12-19 – 2022-12-20 (×2): 2.5 mg via RESPIRATORY_TRACT
  Filled 2022-12-19 (×2): qty 3

## 2022-12-19 MED ORDER — IBUPROFEN 200 MG PO TABS
400.0000 mg | ORAL_TABLET | Freq: Once | ORAL | Status: AC
  Administered 2022-12-19: 400 mg via ORAL
  Filled 2022-12-19: qty 2

## 2022-12-19 MED ORDER — SODIUM CHLORIDE 0.9 % IV SOLN
2.0000 g | Freq: Three times a day (TID) | INTRAVENOUS | Status: DC
  Administered 2022-12-19 – 2022-12-23 (×13): 2 g via INTRAVENOUS
  Filled 2022-12-19 (×13): qty 12.5

## 2022-12-19 NOTE — TOC Initial Note (Signed)
Transition of Care [facility]) - Initial/Assessment Note    Patient Details  Name: Tracey Perkins MRN: [id number] Date of Birth: 08/04/1985  [facility] CM/SW Contact:    Lanier Clam, RN Phone Number: 12/19/2022, 3:34 PM  Clinical Narrative: d/c plan home                  Expected Discharge Plan: Home/Self Care Barriers to Discharge: Continued Medical Work up   Patient Goals and CMS Choice Patient states their goals for this hospitalization and ongoing recovery are:: Home CMS Medicare.gov Compare Post Acute Care list provided to:: Patient Choice offered to / list presented to : Patient [place] ownership interest in [facility].provided to:: Patient    Expected Discharge Plan and Services   Discharge Planning Services: CM Consult Post Acute Care Choice: Resumption of Svcs/PTA Provider Living arrangements for the past 2 months: Single Family Home                                      Prior Living Arrangements/Services Living arrangements for the past 2 months: Single Family Home Lives with:: Relatives                   Activities of Daily Living   ADL Screening (condition at time of admission) Independently performs ADLs?: No Does the patient have a NEW difficulty with bathing/dressing/toileting/self-feeding that is expected to last >3 days?: No Does the patient have a NEW difficulty with getting in/out of bed, walking, or climbing stairs that is expected to last >3 days?: No Does the patient have a NEW difficulty with communication that is expected to last >3 days?: No Is the patient deaf or have difficulty hearing?: Yes Does the patient have difficulty seeing, even when wearing glasses/contacts?: Yes Does the patient have difficulty concentrating, remembering, or making decisions?: Yes  Permission Sought/Granted                  Emotional Assessment              Admission diagnosis:  Hypoxia [R09.02] Acute  respiratory failure with hypoxia (HCC) [J96.01] Fever, unspecified fever cause [R50.9] Pneumonia due to infectious organism, unspecified laterality, unspecified part of lung [J18.9] Patient Active Problem List   Diagnosis Date Noted   Acute respiratory failure with hypoxia (HCC) 12/18/2022   Class 1 obesity 12/18/2022   Multifocal pneumonia 12/18/2022   Hyperlipidemia 12/18/2022   Colitis 12/22/2021   Tachycardia 07/12/2021   Intractable nausea and vomiting 07/12/2021   Diarrhea 07/12/2021   Diabetic neuropathy (HCC) 07/12/2021   Hypothyroidism 07/12/2021   CVA (cerebral vascular accident) (HCC) 09/16/2020   Mild cognitive impairment 09/04/2020   Diabetic gastroparesis (HCC) 09/04/2020   History of CVA (cerebrovascular accident) 09/03/2020   Diabetic polyneuropathy associated with type 1 diabetes mellitus (HCC) 05/15/2020   Post concussive syndrome 05/15/2020   Insulin pump status 04/02/2020   Impulsiveness 10/14/2019   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   Mood disorder (HCC) 08/27/2019   Anxiety 08/16/2019   Polyneuropathy 06/19/2019   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   Status post radiation therapy 11/22/2018   Seasonal and perennial allergic rhinitis 11/22/2018   Seasonal allergic conjunctivitis 11/22/2018   Acute sinusitis 11/22/2018   Keratosis pilaris 10/05/2017   Melanocytic nevi of trunk 10/05/2017  Adverse food reaction 02/09/2017   Gastroesophageal reflux disease 02/09/2017   Mild intermittent asthma with acute exacerbation 11/19/2015   Mild persistent asthma without complication 11/16/2015   Acute seasonal allergic rhinitis due to pollen 11/16/2015   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   Type 1 diabetes (HCC) 11/16/2015   Dry mouth 11/16/2015   Xerosis cutis 11/16/2015   Nystagmus 08/15/2014    Visual field defect 08/15/2014   Disequilibrium 07/25/2014   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   PCP:  Chilton Greathouse, MD Pharmacy:   [facility] [phone] [address], [place] - [address] [place] [address] Phone: [phone] Fax: [phone]     Social Determinants of Health (SDOH) Social History: SDOH Screenings   Food Insecurity: No Food Insecurity (12/18/2022)  Housing: Low Risk  (12/18/2022)  Transportation Needs: No Transportation Needs (12/18/2022)  Utilities: Not At Risk (12/18/2022)  Depression (PHQ2-9): Low Risk  (09/02/2021)  Financial Resource Strain: Low Risk  (09/08/2022)   Received from [facility]  Tobacco Use: Low Risk  (12/18/2022)   SDOH Interventions:     Readmission Risk Interventions     No data to display

## 2022-12-19 NOTE — Progress Notes (Signed)
Patient ambulating to bathroom or BSC with SBA and FWW.   00:15am - Fever: 102.4 oral. Refusing ice packs. Tylenol and Advil given. Yellow MEWS 3. Cross-over aware.   Refusing hosp CPAP.   Mother at bedside.

## 2022-12-19 NOTE — Progress Notes (Signed)
   12/19/22 2255  BiPAP/CPAP/SIPAP  BiPAP/CPAP/SIPAP Pt Type Adult (cpap set up and ready to use, mom is at bedside.)  BiPAP/CPAP/SIPAP  (home cpap)  FiO2 (%) 21 %  Patient Home Equipment Yes  Safety Check Completed by RT for Home Unit Yes, no issues noted

## 2022-12-19 NOTE — Plan of Care (Signed)
  Problem: Coping: Goal: Ability to adjust to condition or change in health will improve Outcome: Progressing   Problem: Fluid Volume: Goal: Ability to maintain a balanced intake and output will improve Outcome: Progressing   Problem: Nutritional: Goal: Maintenance of adequate nutrition will improve Outcome: Progressing   Problem: Skin Integrity: Goal: Risk for impaired skin integrity will decrease Outcome: Progressing   Problem: Tissue Perfusion: Goal: Adequacy of tissue perfusion will improve Outcome: Progressing   Problem: Clinical Measurements: Goal: Respiratory complications will improve Outcome: Progressing   Problem: Nutrition: Goal: Adequate nutrition will be maintained Outcome: Progressing   Problem: Elimination: Goal: Will not experience complications related to bowel motility Outcome: Progressing   Problem: Pain Management: Goal: General experience of comfort will improve Outcome: Progressing   Problem: Safety: Goal: Ability to remain free from injury will improve Outcome: Progressing   Problem: Skin Integrity: Goal: Risk for impaired skin integrity will decrease Outcome: Progressing

## 2022-12-19 NOTE — Inpatient Diabetes Management (Signed)
Inpatient Diabetes Program Recommendations  AACE/ADA: New Consensus Statement on Inpatient Glycemic Control (2015)  Target Ranges:  Prepandial:   less than 140 mg/dL      Peak postprandial:   less than 180 mg/dL (1-2 hours)      Critically ill patients:  140 - 180 mg/dL   Lab Results  Component Value Date   GLUCAP 269 (H) 12/19/2022   HGBA1C 8.8 (H) 07/13/2021    Review of Glycemic Control  Diabetes history: DM type 1 Tandem insulin pump with Novolog and a dexcom G7  Pump Profile Settings - Duh Active at upload  Time Basal Rate (u/hr) 12:00 AM 0.900 3:00 AM 0.900 9:00 AM 1.600 3:00 PM 1.800 6:00 PM 1.100 7:00 PM 1.100 9:00 PM 1.100 Total Daily Basal: 29.700 u  Time Correction Factor (u:mg/dL) 53:66 AM 4:40 3:47 AM 1:30 9:00 AM 1:30 3:00 PM 1:30 6:00 PM 1:30 7:00 PM 1:25 9:00 PM 1:25  Time Carb Ratio (u:grams) 12:00 AM 1:8.0 3:00 AM 1:8.0 9:00 AM 1:8.0 3:00 PM 1:7.0 6:00 PM 1:6.0 7:00 PM 1:7.0 9:00 PM 1:8.0  Time Target BG (mg/dL) 42:59 AM 563 8:75 AM 643 9:00 AM 110 3:00 PM 85 6:00 PM 85 7:00 PM 85 9:00 PM 110  Insulin Duration: 5 hr  Insulin pump order set inpt  Spoke with pt at bedside pt sees Dr. Eileen Stanford brothers with [facility] for DM management.  And last saw her on 11/22. Nmo chjanges were made to the pump settings at that time. Pt reports having her insulin pump insertion site on her right leg and her dexcom on her left abd. Pt reports A1c a 8.6% more recently and she follows up with her Endocrinologist every 3 months.   Pt will need a replacement Dexcom CGM due to the one she presently had came off due to a radiologic procedure. Pt reports having extra insulin supplies at bedside.  Will follow trends while inpatient  Thanks,  Christena Deem RN, MSN, BC-ADM Inpatient Diabetes Coordinator Team Pager [phone] (8a-5p)

## 2022-12-19 NOTE — Plan of Care (Signed)
  Problem: Education: Goal: Ability to describe self-care measures that may prevent or decrease complications (Diabetes Survival Skills Education) will improve Outcome: Progressing   Problem: Coping: Goal: Ability to adjust to condition or change in health will improve Outcome: Progressing   Problem: Fluid Volume: Goal: Ability to maintain a balanced intake and output will improve Outcome: Progressing   Problem: Health Behavior/Discharge Planning: Goal: Ability to identify and utilize available resources and services will improve Outcome: Progressing Goal: Ability to manage health-related needs will improve Outcome: Progressing   Problem: Metabolic: Goal: Ability to maintain appropriate glucose levels will improve Outcome: Progressing   Problem: Nutritional: Goal: Progress toward achieving an optimal weight will improve Outcome: Progressing   Problem: Education: Goal: Knowledge of General Education information will improve Description: Including pain rating scale, medication(s)/side effects and non-pharmacologic comfort measures Outcome: Progressing   Problem: Health Behavior/Discharge Planning: Goal: Ability to manage health-related needs will improve Outcome: Progressing   Problem: Clinical Measurements: Goal: Ability to maintain clinical measurements within normal limits will improve Outcome: Progressing Goal: Will remain free from infection Outcome: Progressing Goal: Diagnostic test results will improve Outcome: Progressing   Problem: Activity: Goal: Risk for activity intolerance will decrease Outcome: Progressing   Problem: Coping: Goal: Level of anxiety will decrease Outcome: Progressing   Problem: Safety: Goal: Ability to remain free from injury will improve Outcome: Progressing   Problem: Nutritional: Goal: Maintenance of adequate nutrition will improve Outcome: Adequate for Discharge   Problem: Skin Integrity: Goal: Risk for impaired skin integrity  will decrease Outcome: Adequate for Discharge   Problem: Tissue Perfusion: Goal: Adequacy of tissue perfusion will improve Outcome: Adequate for Discharge   Problem: Clinical Measurements: Goal: Respiratory complications will improve Outcome: Adequate for Discharge Goal: Cardiovascular complication will be avoided Outcome: Adequate for Discharge   Problem: Nutrition: Goal: Adequate nutrition will be maintained Outcome: Adequate for Discharge   Problem: Pain Management: Goal: General experience of comfort will improve Outcome: Adequate for Discharge   Problem: Skin Integrity: Goal: Risk for impaired skin integrity will decrease Outcome: Adequate for Discharge

## 2022-12-19 NOTE — Progress Notes (Signed)
PROGRESS NOTE SHAM CORAL  YQI:[id number] DOB: 06-Mar-1985 DOA: 12/18/2022 PCP: Chilton Greathouse, MD  Brief Narrative/Hospital Course: 37 y.o. female with medical history significant of asthma, brain tumor as a child medulloblastoma, history of TBI as an adult, history of CVA, mild cognitive impairment, postconcussion syndrome, vision changes, type 1 diabetes, diabetic gastroparesis, intractable nausea and vomiting, diabetic peripheral neuropathy, eczema, seasonal allergies, food allergy,hypothyroidism, OSA on CPAP who has been having cough for the past 2 weeks associated with wheezing, progressively worse dyspnea and productive cough of greenish sputum.  She was taken to the urgent care on Thursday started on Augmentin, developed a fever  on saturda  and PCP was called, a Zithromax was added to her regimen. 12/1 her mother woke up, she went to check on her and found her to be febrile with a fever of 104 F and her O2 sat was 86%.  She was not wearing her CPAP last night.   In ED febrile 102.9 tachycardic BP stable 90% on room air.  Given supplemental oxygen, Lab work: CBC, PT/INR/PTT, magnesium and phosphorus were normal.  Negative coronavirus, influenza and RSV PCR.  Lactic acid 0.3 mmol/L.  Negative serum pregnancy test.  CMP showed a 734, chloride 97 mmol/L.  Glucose 158, creatinine 1.40 and calcium 8.5 mg/dL.  The rest of the CMP measurements were normal. Cxr>>  and CTA chest no evidence of PE.  Bilateral upper lobe and right middle lobe infiltrates, likely due to pneumonia.  Mild bilateral hilar and mediastinal lymphadenopathy, nonspecific but likely reactive in etiology.  Chest CT follow-up recommended in 3 months Patient received supplemental oxygen, normal saline 1000 mL bolus, ibuprofen 600 mg p.o. x 1, acetaminophen 1000 mg p.o., was started on cefepime and vancomycin.  I added LR 1 L bolus, followed by NS at 125 mL/h     Subjective: Seen this am Mother at bedside On RA. Feeling  better Afebrile overnight Labs with creatinine downtrending 1.2 from 1.4, stable CBC with anemia 9.8 g  Assessment and Plan: Principal Problem:   Acute respiratory failure with hypoxia (HCC) Active Problems:   Type 1 diabetes (HCC)   Gastroesophageal reflux disease   Chronic lymphocytic thyroiditis   Mild intermittent asthma with acute exacerbation   Anxiety   Mild episode of recurrent major depressive disorder (HCC)   Mild cognitive impairment   Diabetic neuropathy (HCC)   Hypothyroidism   Insulin pump status   Class 1 obesity   Multifocal pneumonia   Hyperlipidemia   Acute hypoxic respiratory failure Multifocal pneumonia Mild intermittent asthma with acute exacerbation: Patient febrile recently on azithromycin and Augmentin at home, CTA and chest x-ray showed multifocal pneumonia.  Follow-up sputum culturE/STUDIES,blod cx. MRSA PCR NEGATIVE. Possible "red man" syndrome with vancomycin- MRSA pcr negative- so will vanco. continue current cefepime. Continue Pulmicort, bronchodilators Recent Labs  Lab 12/18/22 0443 12/18/22 0607 12/19/22 0446  WBC 7.9  --  5.8  LATICACIDVEN  --  0.3*  --     Type 1 diabetes mellitus on insulin pump: Diabetic neuropathy: Blood sugar remains fairly controlled, on admission uncontrolled at 247.  Continue insulin pump Recent Labs  Lab 12/18/22 1651 12/18/22 2235 12/19/22 0204 12/19/22 0405 12/19/22 0738  GLUCAP 247* 137* 116* 105* 172*   Mild AKI: Creatinine improving encourage oral hydration avoid nephrotoxic medication monitor BUN/creat Recent Labs    12/22/21 0117 12/23/21 0513 03/27/22 1415 03/27/22 1442 10/14/22 0630 12/18/22 0443 12/19/22 0446  BUN 14 8 12   --  8 16 15   CREATININE  1.04* 1.12* 1.06*  --  1.09* 1.40* 1.23*  CO2 29 26 28   --  26 28 24   K 3.8 3.5 3.4* 3.3* 3.9 3.7 3.9    GERD: Continue PPI  Chronic lymphocytic thyroiditis with hypothyroidism Continue home Synthroid.  Hyperlipidemia:  Continue  statin  Mild cognitive impairment with history of multiple CNS insults: Supportive care  Obesity w/ ZOX:[id number] Body mass index is 33.94 kg/m. : Will benefit with PCP follow-up, weight loss  healthy lifestyle and cont cpap   DVT prophylaxis: enoxaparin (LOVENOX) injection 40 mg Start: 12/18/22 2200 Code Status:   Code Status: Full Code Family Communication: plan of care discussed with patient/mother at bedside. Patient status is: Inpatient because of respiratory failure Level of care: Progressive   Dispo: The patient is from: Home with mother            Anticipated disposition: tbd Objective: Vitals last 24 hrs: Vitals:   12/19/22 0015 12/19/22 0159 12/19/22 0539 12/19/22 0752  BP:  124/79 106/80   Pulse:  (!) 101 78   Resp:  16 14   Temp: (!) 102.4 F (39.1 C) 99.4 F (37.4 C) 98.4 F (36.9 C)   TempSrc: Oral Oral Oral   SpO2:  97% 100% 92%  Weight:       Weight change:   Physical Examination: General exam: alert awake, older than stated age HEENT:Oral mucosa moist, Ear/Nose WNL grossly Respiratory system: bilaterally diminished BS, no use of accessory muscle Cardiovascular system: S1 & S2 +, No JVD. Gastrointestinal system: Abdomen soft,NT,ND, BS+ Nervous System:Alert, awake, moving extremities. Extremities: LE edema neg,distal peripheral pulses palpable.  Skin: No rashes,no icterus. MSK: Normal muscle bulk,tone, power  Medications reviewed:  Scheduled Meds:  albuterol  2.5 mg Nebulization BID   aspirin EC  81 mg Oral Daily   budesonide (PULMICORT) nebulizer solution  0.5 mg Nebulization BID   citalopram  5 mg Oral QHS   enoxaparin (LOVENOX) injection  40 mg Subcutaneous Q24H   gabapentin  300 mg Oral Daily   gabapentin  600 mg Oral QHS   insulin pump   Subcutaneous TID WC, HS, 0200   levothyroxine  75 mcg Oral QAC breakfast   lubiprostone  16 mcg Oral Q breakfast   magnesium oxide  800 mg Oral QHS   montelukast  10 mg Oral QHS   pantoprazole  40 mg Oral  Daily   potassium chloride  10 mEq Oral Daily   pravastatin  20 mg Oral QPC supper   saccharomyces boulardii  250 mg Oral Daily   Continuous Infusions:  ceFEPime (MAXIPIME) IV 2 g (12/19/22 0414)   vancomycin        Diet Order             Diet heart healthy/carb modified Room service appropriate? Yes; Fluid consistency: Thin  Diet effective now                  Intake/Output Summary (Last 24 hours) at 12/19/2022 0839 Last data filed at 12/19/2022 0500 Gross per 24 hour  Intake 2400.89 ml  Output --  Net 2400.89 ml   Net IO Since Admission: 3,690.92 mL [12/19/22 0839]  Wt Readings from Last 3 Encounters:  12/18/22 86.9 kg  10/18/22 86.9 kg  10/14/22 83.9 kg     Unresulted Labs (From admission, onward)    None     Data Reviewed: I have personally reviewed following labs and imaging studies CBC: Recent Labs  Lab 12/18/22 0443 12/19/22 0446  WBC 7.9 5.8  NEUTROABS 5.6  --   HGB 12.3 9.8*  HCT 36.8 29.4*  MCV 91.5 92.7  PLT 278 208   Basic Metabolic Panel:  Recent Labs  Lab 12/18/22 0443 12/19/22 0446  NA 134* 136  K 3.7 3.9  CL 97* 105  CO2 28 24  GLUCOSE 158* 136*  BUN 16 15  CREATININE 1.40* 1.23*  CALCIUM 8.5* 7.5*  MG 2.2  --   PHOS 2.7  --    GFR: Estimated Creatinine Clearance: 66.1 mL/min (A) (by C-G formula based on SCr of 1.23 mg/dL (H)). Liver Function Tests:  Recent Labs  Lab 12/18/22 0443 12/19/22 0446  AST 27 56*  ALT 23 41  ALKPHOS 91 85  BILITOT 0.3 0.2  PROT 7.7 6.0*  ALBUMIN 3.9 3.0*   No results for input(s): "LIPASE", "AMYLASE" in the last 168 hours. No results for input(s): "AMMONIA" in the last 168 hours. Coagulation Profile:  Recent Labs  Lab 12/18/22 0443  INR 1.1   No results for input(s): "PROBNP" in the last 168 hours.  No results for input(s): "HGBA1C" in the last 72 hours. Recent Labs  Lab 12/18/22 1651 12/18/22 2235 12/19/22 0204 12/19/22 0405 12/19/22 0738  GLUCAP 247* 137* 116* 105* 172*   No  results for input(s): "CHOL", "HDL", "LDLCALC", "TRIG", "CHOLHDL", "LDLDIRECT" in the last 72 hours. No results for input(s): "TSH", "T4TOTAL", "FREET4", "T3FREE", "THYROIDAB" in the last 72 hours. Sepsis Labs: Recent Labs  Lab 12/18/22 0607  LATICACIDVEN 0.3*    Recent Results (from the past 240 hour(s))  Blood Culture (routine x 2)     Status: None (Preliminary result)   Collection Time: 12/18/22  4:42 AM   Specimen: BLOOD  Result Value Ref Range Status   Specimen Description   Final    BLOOD RIGHT ANTECUBITAL Performed at [facility], [address], [place], [place] [address]    Special Requests   Final    Blood Culture adequate volume BOTTLES DRAWN AEROBIC AND ANAEROBIC Performed at [facility], [address] [place], [place] [address]    Culture   Final    NO GROWTH < 24 HOURS Performed at [facility], [address] [place], [place] [address]    Report Status PENDING  Incomplete  Blood Culture (routine x 2)     Status: None (Preliminary result)   Collection Time: 12/18/22  4:42 AM   Specimen: BLOOD  Result Value Ref Range Status   Specimen Description   Final    BLOOD LEFT ANTECUBITAL Performed at [facility], [address], [place], [place] [address]    Special Requests   Final    Blood Culture adequate volume BOTTLES DRAWN AEROBIC AND ANAEROBIC Performed at [facility], [address], [place], [place] [address]    Culture   Final    NO GROWTH < 24 HOURS Performed at [facility], [address] [place], [place] [address]    Report Status PENDING  Incomplete  Resp panel by RT-PCR (RSV, Flu A&B, Covid) Anterior Nasal Swab     Status: None   Collection Time: 12/18/22  4:43 AM   Specimen: Anterior Nasal Swab  Result Value Ref Range Status   SARS Coronavirus 2 by RT PCR NEGATIVE NEGATIVE Final    Comment: (NOTE) SARS-CoV-2 target nucleic acids are NOT  DETECTED.  The SARS-CoV-2 RNA is generally detectable in upper respiratory specimens during the acute  phase of infection. The lowest concentration of SARS-CoV-2 viral copies this assay can detect is 138 copies/mL. A negative result does not preclude SARS-Cov-2 infection and should not be used as the sole basis for treatment or other patient management decisions. A negative result may occur with  improper specimen collection/handling, submission of specimen other than nasopharyngeal swab, presence of viral mutation(s) within the areas targeted by this assay, and inadequate number of viral copies(<138 copies/mL). A negative result must be combined with clinical observations, patient history, and epidemiological information. The expected result is Negative.  Fact Sheet for Patients:  BloggerCourse.com  Fact Sheet for Healthcare Providers:  SeriousBroker.it  This test is no t yet approved or cleared by the [place] FDA and  has been authorized for detection and/or diagnosis of SARS-CoV-2 by FDA under an Emergency Use Authorization (EUA). This EUA will remain  in effect (meaning this test can be used) for the duration of the COVID-19 declaration under Section 564(b)(1) of the Act, 21 U.S.C.section 360bbb-3(b)(1), unless the authorization is terminated  or revoked sooner.       Influenza A by PCR NEGATIVE NEGATIVE Final   Influenza B by PCR NEGATIVE NEGATIVE Final    Comment: (NOTE) The Xpert Xpress SARS-CoV-2/FLU/RSV plus assay is intended as an aid in the diagnosis of influenza from Nasopharyngeal swab specimens and should not be used as a sole basis for treatment. Nasal washings and aspirates are unacceptable for Xpert Xpress SARS-CoV-2/FLU/RSV testing.  Fact Sheet for Patients: BloggerCourse.com  Fact Sheet for Healthcare Providers: SeriousBroker.it  This test is not yet  approved or cleared by the [place] FDA and has been authorized for detection and/or diagnosis of SARS-CoV-2 by FDA under an Emergency Use Authorization (EUA). This EUA will remain in effect (meaning this test can be used) for the duration of the COVID-19 declaration under Section 564(b)(1) of the Act, 21 U.S.C. section 360bbb-3(b)(1), unless the authorization is terminated or revoked.     Resp Syncytial Virus by PCR NEGATIVE NEGATIVE Final    Comment: (NOTE) Fact Sheet for Patients: BloggerCourse.com  Fact Sheet for Healthcare Providers: SeriousBroker.it  This test is not yet approved or cleared by the [place] FDA and has been authorized for detection and/or diagnosis of SARS-CoV-2 by FDA under an Emergency Use Authorization (EUA). This EUA will remain in effect (meaning this test can be used) for the duration of the COVID-19 declaration under Section 564(b)(1) of the Act, 21 U.S.C. section 360bbb-3(b)(1), unless the authorization is terminated or revoked.  Performed at [facility], [address], [place], [place] [address]   MRSA Next Gen by PCR, Nasal     Status: None   Collection Time: 12/18/22  5:40 PM   Specimen: Nasal Mucosa; Nasal Swab  Result Value Ref Range Status   MRSA by PCR Next Gen NOT DETECTED NOT DETECTED Final    Comment: (NOTE) The GeneXpert MRSA Assay (FDA approved for NASAL specimens only), is one component of a comprehensive MRSA colonization surveillance program. It is not intended to diagnose MRSA infection nor to guide or monitor treatment for MRSA infections. Test performance is not FDA approved in patients less than 2 years old. Performed at [facility], [address] [place], [place] [address]     Antimicrobials: Anti-infectives (From admission, onward)    Start     Dose/Rate Route Frequency Ordered Stop   12/19/22 0800  vancomycin  (VANCOCIN) IVPB 1000 mg/200 mL premix  1,000 mg 100 mL/hr over 120 Minutes Intravenous Every 24 hours 12/18/22 1903     12/19/22 0600  vancomycin (VANCOCIN) IVPB 1000 mg/200 mL premix  Status:  Discontinued        1,000 mg 200 mL/hr over 60 Minutes Intravenous Every 24 hours 12/18/22 0535 12/18/22 1903   12/18/22 1600  ceFEPIme (MAXIPIME) 2 g in sodium chloride 0.9 % 100 mL IVPB        2 g 200 mL/hr over 30 Minutes Intravenous Every 12 hours 12/18/22 0535     12/18/22 0515  vancomycin (VANCOCIN) IVPB 1000 mg/200 mL premix        1,000 mg 200 mL/hr over 60 Minutes Intravenous  Once 12/18/22 0505 12/18/22 0645   12/18/22 0515  ceFEPIme (MAXIPIME) 2 g in sodium chloride 0.9 % 100 mL IVPB        2 g 200 mL/hr over 30 Minutes Intravenous  Once 12/18/22 0505 12/18/22 0601      Culture/Microbiology    Component Value Date/Time   SDES  12/18/2022 0442    BLOOD RIGHT ANTECUBITAL Performed at [facility], [address] [place], [place] [address]    SDES  12/18/2022 [phone]    BLOOD LEFT ANTECUBITAL Performed at [facility], [address] [place], [place] [address]    [facility]  12/18/2022 [phone]    Blood Culture adequate volume BOTTLES DRAWN AEROBIC AND ANAEROBIC Performed at [facility], [address], [place], [place] [address]    [facility]  12/18/2022 [phone]    Blood Culture adequate volume BOTTLES DRAWN AEROBIC AND ANAEROBIC Performed at [facility], [address] [place], [place] [address]    CULT  12/18/2022 [phone]    NO GROWTH < 24 HOURS Performed at [facility], [address], [place], [place] [address]    CULT  12/18/2022 [phone]    NO GROWTH < 24 HOURS Performed at [facility], [address], [place], [place] [address]    REPTSTATUS PENDING 12/18/2022 9563   REPTSTATUS PENDING 12/18/2022 0442   Other culture-see note  Radiology Studies: CT Angio Chest PE W and/or Wo  Contrast  Result Date: 12/18/2022 CLINICAL DATA:  Cough. Hypoxia. Intermittent fever. Suspected pulmonary embolism. EXAM: CT ANGIOGRAPHY CHEST WITH CONTRAST TECHNIQUE: Multidetector CT imaging of the chest was performed using the standard protocol during bolus administration of intravenous contrast. Multiplanar CT image reconstructions and MIPs were obtained to evaluate the vascular anatomy. RADIATION DOSE REDUCTION: This exam was performed according to the departmental dose-optimization program which includes automated exposure control, adjustment of the mA and/or kV according to patient size and/or use of iterative reconstruction technique. CONTRAST:  75mL OMNIPAQUE IOHEXOL 350 MG/ML SOLN COMPARISON:  None Available. FINDINGS: Cardiovascular: Satisfactory opacification of pulmonary arteries noted, and no pulmonary emboli identified. No evidence of thoracic aortic dissection or aneurysm. Mediastinum/Nodes: Mild bilateral hilar and mediastinal lymphadenopathy is noted, which is nonspecific and may be reactive in etiology. Lungs/Pleura: Pulmonary airspace disease with air bronchograms noted in the anterior left upper lobe. Mild infiltrates also seen in the central right upper and middle lobes. Dependent atelectasis also seen in both lower lobes. No pleural effusion. Upper abdomen: No acute findings. Musculoskeletal: No suspicious bone lesions identified. Review of the MIP images confirms the above findings. IMPRESSION: No evidence of pulmonary embolism. Bilateral upper lobe and right middle lobe infiltrates, likely due to pneumonia. Mild bilateral hilar and mediastinal lymphadenopathy, nonspecific but likely reactive in etiology.  Recommend follow-up by chest CT in 3 months. Electronically Signed   By: Danae [place] M.D.   On: 12/18/2022 09:37   DG Chest Port 1 View  Result Date: 12/18/2022 CLINICAL DATA:  Questionable sepsis EXAM: PORTABLE CHEST 1 VIEW COMPARISON:  12/12/2021 FINDINGS: Low volume chest. There is  no edema, consolidation, effusion, or pneumothorax. Normal heart size and mediastinal contours. Artifact from EKG leads. IMPRESSION: Low volume chest without pneumonia. Electronically Signed   By: Tiburcio Pea M.D.   On: 12/18/2022 05:23     LOS: 1 day   Total time spent in review of labs and imaging, patient evaluation, formulation of plan, documentation and communication with family: 50  minutes  Lanae Boast, MD [facility]  12/19/2022, 8:39 AM

## 2022-12-19 NOTE — Hospital Course (Addendum)
37 y.o. female with medical history significant of asthma, brain tumor as a child medulloblastoma, history of TBI as an adult, history of CVA, mild cognitive impairment, postconcussion syndrome, vision changes, type 1 diabetes, diabetic gastroparesis, intractable nausea and vomiting, diabetic peripheral neuropathy, eczema, seasonal allergies, food allergy,hypothyroidism, OSA on CPAP who has been having cough for the past 2 weeks associated with wheezing, progressively worse dyspnea and productive cough of greenish sputum.  She was taken to the urgent care on Thursday started on Augmentin, developed a fever  on saturda  and PCP was called, a Zithromax was added to her regimen. 12/1 her mother woke up, she went to check on her and found her to be febrile with a fever of 104 F and her O2 sat was 86%.  She was not wearing her CPAP last night.   In ED febrile 102.9 tachycardic BP stable 90% on room air.  Given supplemental oxygen, Lab work: CBC, PT/INR/PTT, magnesium and phosphorus were normal.  Negative coronavirus, influenza and RSV PCR.  Lactic acid 0.3 mmol/L.  Negative serum pregnancy test.  CMP showed a 734, chloride 97 mmol/L.  Glucose 158, creatinine 1.40 and calcium 8.5 mg/dL.  The rest of the CMP measurements were normal. Cxr>>  and CTA chest no evidence of PE.  Bilateral upper lobe and right middle lobe infiltrates, likely due to pneumonia.  Mild bilateral hilar and mediastinal lymphadenopathy, nonspecific but likely reactive in etiology.  Chest CT follow-up recommended in 3 months Patient received supplemental oxygen, normal saline 1000 mL bolus, ibuprofen 600 mg p.o. x 1, acetaminophen 1000 mg p.o., was started on cefepime and vancomycin.  I added LR 1 L bolus, followed by NS at 125 mL/h Patient was admitted for community-acquired pneumonia.  Antibiotics adjusted Patient was having intermittent fever-antibiotic adjusted azithromycin added, continued on cefepime and overall slowly improving, RVP panel  negative. Blood cx negtd

## 2022-12-20 DIAGNOSIS — J9601 Acute respiratory failure with hypoxia: Secondary | ICD-10-CM | POA: Diagnosis not present

## 2022-12-20 LAB — BASIC METABOLIC PANEL
Anion gap: 9 (ref 5–15)
BUN: 17 mg/dL (ref 6–20)
CO2: 24 mmol/L (ref 22–32)
Calcium: 7.6 mg/dL — ABNORMAL LOW (ref 8.9–10.3)
Chloride: 104 mmol/L (ref 98–111)
Creatinine, Ser: 1.25 mg/dL — ABNORMAL HIGH (ref 0.44–1.00)
GFR, Estimated: 57 mL/min — ABNORMAL LOW (ref >60)
Glucose, Bld: 85 mg/dL (ref 70–99)
Potassium: 3.5 mmol/L (ref 3.5–5.1)
Sodium: 137 mmol/L (ref 135–145)

## 2022-12-20 LAB — CBC
HCT: 32.4 % — ABNORMAL LOW (ref 36.0–46.0)
Hemoglobin: 10.6 g/dL — ABNORMAL LOW (ref 12.0–15.0)
MCH: 30.2 pg (ref 26.0–34.0)
MCHC: 32.7 g/dL (ref 30.0–36.0)
MCV: 92.3 fL (ref 80.0–100.0)
Platelets: 210 10*3/uL (ref 150–400)
RBC: 3.51 MIL/uL — ABNORMAL LOW (ref 3.87–5.11)
RDW: 13.7 % (ref 11.5–15.5)
WBC: 5.3 10*3/uL (ref 4.0–10.5)
nRBC: 0 % (ref 0.0–0.2)

## 2022-12-20 LAB — GLUCOSE, CAPILLARY
Glucose-Capillary: 105 mg/dL — ABNORMAL HIGH (ref 70–99)
Glucose-Capillary: 119 mg/dL — ABNORMAL HIGH (ref 70–99)
Glucose-Capillary: 289 mg/dL — ABNORMAL HIGH (ref 70–99)
Glucose-Capillary: 290 mg/dL — ABNORMAL HIGH (ref 70–99)
Glucose-Capillary: 96 mg/dL (ref 70–99)

## 2022-12-20 MED ORDER — FLUTICASONE PROPIONATE 50 MCG/ACT NA SUSP
2.0000 | Freq: Every day | NASAL | Status: DC
  Administered 2022-12-20 – 2022-12-23 (×4): 2 via NASAL
  Filled 2022-12-20: qty 16

## 2022-12-20 NOTE — Plan of Care (Signed)
  Problem: Coping: Goal: Ability to adjust to condition or change in health will improve Outcome: Progressing   Problem: Fluid Volume: Goal: Ability to maintain a balanced intake and output will improve Outcome: Progressing   Problem: Metabolic: Goal: Ability to maintain appropriate glucose levels will improve Outcome: Progressing   Problem: Nutritional: Goal: Maintenance of adequate nutrition will improve Outcome: Progressing   Problem: Skin Integrity: Goal: Risk for impaired skin integrity will decrease Outcome: Progressing   Problem: Tissue Perfusion: Goal: Adequacy of tissue perfusion will improve Outcome: Progressing   Problem: Clinical Measurements: Goal: Respiratory complications will improve Outcome: Progressing Goal: Cardiovascular complication will be avoided Outcome: Progressing   Problem: Activity: Goal: Risk for activity intolerance will decrease Outcome: Progressing   Problem: Nutrition: Goal: Adequate nutrition will be maintained Outcome: Progressing   Problem: Pain Management: Goal: General experience of comfort will improve Outcome: Progressing   Problem: Safety: Goal: Ability to remain free from injury will improve Outcome: Progressing   Problem: Skin Integrity: Goal: Risk for impaired skin integrity will decrease Outcome: Progressing

## 2022-12-20 NOTE — Progress Notes (Addendum)
PROGRESS NOTE Tracey Perkins  ZHY:[id number] DOB: January 30, 1985 DOA: 12/18/2022 PCP: Chilton Greathouse, MD  Brief Narrative/Hospital Course: 37 y.o. female with medical history significant of asthma, brain tumor as a child medulloblastoma, history of TBI as an adult, history of CVA, mild cognitive impairment, postconcussion syndrome, vision changes, type 1 diabetes, diabetic gastroparesis, intractable nausea and vomiting, diabetic peripheral neuropathy, eczema, seasonal allergies, food allergy,hypothyroidism, OSA on CPAP who has been having cough for the past 2 weeks associated with wheezing, progressively worse dyspnea and productive cough of greenish sputum.  She was taken to the urgent care on Thursday started on Augmentin, developed a fever  on saturda  and PCP was called, a Zithromax was added to her regimen. 12/1 her mother woke up, she went to check on her and found her to be febrile with a fever of 104 F and her O2 sat was 86%.  She was not wearing her CPAP last night.   In ED febrile 102.9 tachycardic BP stable 90% on room air.  Given supplemental oxygen, Lab work: CBC, PT/INR/PTT, magnesium and phosphorus were normal.  Negative coronavirus, influenza and RSV PCR.  Lactic acid 0.3 mmol/L.  Negative serum pregnancy test.  CMP showed a 734, chloride 97 mmol/L.  Glucose 158, creatinine 1.40 and calcium 8.5 mg/dL.  The rest of the CMP measurements were normal. Cxr>>  and CTA chest no evidence of PE.  Bilateral upper lobe and right middle lobe infiltrates, likely due to pneumonia.  Mild bilateral hilar and mediastinal lymphadenopathy, nonspecific but likely reactive in etiology.  Chest CT follow-up recommended in 3 months Patient received supplemental oxygen, normal saline 1000 mL bolus, ibuprofen 600 mg p.o. x 1, acetaminophen 1000 mg p.o., was started on cefepime and vancomycin.  I added LR 1 L bolus, followed by NS at 125 mL/h   Subjective: Patient seen and examined this morning Mother at the  bedside Febrile last night 102.6 but afebrile since Labs overall stable Having sneezing episodes after breathing treatment and stopped Still sneezing intermittently  Assessment and Plan: Principal Problem:   Acute respiratory failure with hypoxia (HCC) Active Problems:   Type 1 diabetes (HCC)   Gastroesophageal reflux disease   Chronic lymphocytic thyroiditis   Mild intermittent asthma with acute exacerbation   Anxiety   Mild episode of recurrent major depressive disorder (HCC)   Mild cognitive impairment   Diabetic neuropathy (HCC)   Hypothyroidism   Insulin pump status   Class 1 obesity   Multifocal pneumonia   Hyperlipidemia   Acute hypoxic respiratory failure Multifocal pneumonia Moderate intermittent asthma with acute exacerbation: Patient febrile recently on azithromycin and Augmentin at home, CTA and chest x-ray showed multifocal pneumonia.  Blood culture no growth so far, follow-up sputum culture if able to obtain. MRSA swab neg-vancomycin discontinued especially after concern for "red man" syndrome Continue cefepime, still febrile episode last night, continue bronchodilators as needed, continue Pulmicort antitussives I-S and encourage ambulation.  Added Flonase for his nasal Recent Labs  Lab 12/18/22 0443 12/18/22 0607 12/19/22 0446 12/20/22 0458  WBC 7.9  --  5.8 5.3  LATICACIDVEN  --  0.3*  --   --     Type 1 diabetes mellitus on insulin pump: Diabetic neuropathy: Blood sugar remains fairly controlled, on admission uncontrolled at 247. Continue insulin pump and monitor Recent Labs  Lab 12/19/22 1616 12/19/22 2019 12/19/22 2201 12/20/22 0223 12/20/22 0743  GLUCAP 234* 256* 192* 105* 119*   Mild AKI: Creatinine stable slightly better from 1.4 as below.  Baseline creatinine 1.0 in sept.  encourage oral hydration, monitor Recent Labs    12/22/21 0117 12/23/21 0513 03/27/22 1415 03/27/22 1442 10/14/22 0630 12/18/22 0443 12/19/22 0446 12/20/22 0458   BUN 14 8 12   --  8 16 15 17   CREATININE 1.04* 1.12* 1.06*  --  1.09* 1.40* 1.23* 1.25*  CO2 29 26 28   --  26 28 24 24   K 3.8 3.5 3.4* 3.3* 3.9 3.7 3.9 3.5    GERD: On PPI  Chronic lymphocytic thyroiditis with hypothyroidism On synthroid.  Hyperlipidemia:  Continue statin  Mild cognitive impairment with history of multiple CNS insults: Supportive care.  Mother is a caregiver at bedside.  Obesity w/ [id number] Body mass index is 33.94 kg/m. : Will benefit with PCP follow-up, weight loss  healthy lifestyle and cont cpap   DVT prophylaxis: enoxaparin (LOVENOX) injection 40 mg Start: 12/18/22 2200 Code Status:   Code Status: Full Code Family Communication: plan of care discussed with patient/mother at bedside. Patient status is: Inpatient because of respiratory failure Level of care: Progressive   Dispo: The patient is from: Home with mother            Anticipated disposition: Home once afebrile for at least 24 hours  Objective: Vitals last 24 hrs: Vitals:   12/20/22 0222 12/20/22 0523 12/20/22 0525 12/20/22 0822  BP: 97/73 101/74    Pulse: 80 79    Resp: 19 20    Temp: 98.5 F (36.9 C)  98.1 F (36.7 C)   TempSrc: Oral  Oral   SpO2: 94% 95%  96%  Weight:      Height:       Weight change:   Physical Examination: General exam: alert awake, obese, not in distress, hard of hearing HEENT:Oral mucosa moist, Ear/Nose WNL grossly Respiratory system: Bilaterally clear BS,no use of accessory muscle Cardiovascular system: S1 & S2 +, No JVD. Gastrointestinal system: Abdomen soft,NT,ND, BS+ Nervous System: Alert, awake, moving all extremities,and following commands. Extremities: LE edema neg,distal peripheral pulses palpable and warm.  Skin: No rashes,no icterus. MSK: Normal muscle bulk,tone, power   Medications reviewed:  Scheduled Meds:  aspirin EC  81 mg Oral Daily   citalopram  5 mg Oral QHS   enoxaparin (LOVENOX) injection  40 mg Subcutaneous Q24H   fluticasone   2 spray Each Nare Daily   gabapentin  300 mg Oral Daily   gabapentin  600 mg Oral QHS   insulin pump   Subcutaneous TID WC, HS, 0200   levothyroxine  75 mcg Oral QAC breakfast   lubiprostone  16 mcg Oral Q breakfast   magnesium oxide  800 mg Oral QHS   montelukast  10 mg Oral QHS   pantoprazole  40 mg Oral Daily   potassium chloride  10 mEq Oral Daily   pravastatin  20 mg Oral QPC supper   saccharomyces boulardii  250 mg Oral Daily   Continuous Infusions:  ceFEPime (MAXIPIME) IV 2 g (12/20/22 5638)      Diet Order             Diet heart healthy/carb modified Room service appropriate? Yes; Fluid consistency: Thin  Diet effective now                  Intake/Output Summary (Last 24 hours) at 12/20/2022 1035 Last data filed at 12/20/2022 1002 Gross per 24 hour  Intake 580 ml  Output --  Net 580 ml   Net IO Since Admission: 4,270.92 mL [12/20/22  1035]  Wt Readings from Last 3 Encounters:  12/18/22 86.9 kg  10/18/22 86.9 kg  10/14/22 83.9 kg     Unresulted Labs (From admission, onward)     Start     Ordered   12/20/22 0849  Expectorated Sputum Assessment w Gram Stain, Rflx to Resp Cult  Once,   R        12/20/22 0848   12/20/22 0500  Basic metabolic panel  Daily,   R      12/19/22 1055   12/20/22 0500  CBC  Daily,   R      12/19/22 1055          Data Reviewed: I have personally reviewed following labs and imaging studies CBC: Recent Labs  Lab 12/18/22 0443 12/19/22 0446 12/20/22 0458  WBC 7.9 5.8 5.3  NEUTROABS 5.6  --   --   HGB 12.3 9.8* 10.6*  HCT 36.8 29.4* 32.4*  MCV 91.5 92.7 92.3  PLT 278 208 210   Basic Metabolic Panel:  Recent Labs  Lab 12/18/22 0443 12/19/22 0446 12/20/22 0458  NA 134* 136 137  K 3.7 3.9 3.5  CL 97* 105 104  CO2 28 24 24   GLUCOSE 158* 136* 85  BUN 16 15 17   CREATININE 1.40* 1.23* 1.25*  CALCIUM 8.5* 7.5* 7.6*  MG 2.2  --   --   PHOS 2.7  --   --    GFR: Estimated Creatinine Clearance: 65 mL/min (A) (by C-G  formula based on SCr of 1.25 mg/dL (H)). Liver Function Tests:  Recent Labs  Lab 12/18/22 0443 12/19/22 0446  AST 27 56*  ALT 23 41  ALKPHOS 91 85  BILITOT 0.3 0.2  PROT 7.7 6.0*  ALBUMIN 3.9 3.0*   No results for input(s): "LIPASE", "AMYLASE" in the last 168 hours. No results for input(s): "AMMONIA" in the last 168 hours. Coagulation Profile:  Recent Labs  Lab 12/18/22 0443  INR 1.1   No results for input(s): "PROBNP" in the last 168 hours.  No results for input(s): "HGBA1C" in the last 72 hours. Recent Labs  Lab 12/19/22 1616 12/19/22 2019 12/19/22 2201 12/20/22 0223 12/20/22 0743  GLUCAP 234* 256* 192* 105* 119*   No results for input(s): "CHOL", "HDL", "LDLCALC", "TRIG", "CHOLHDL", "LDLDIRECT" in the last 72 hours. No results for input(s): "TSH", "T4TOTAL", "FREET4", "T3FREE", "THYROIDAB" in the last 72 hours. Sepsis Labs: Recent Labs  Lab 12/18/22 0607  LATICACIDVEN 0.3*    Recent Results (from the past 240 hour(s))  Blood Culture (routine x 2)     Status: None (Preliminary result)   Collection Time: 12/18/22  4:42 AM   Specimen: BLOOD  Result Value Ref Range Status   Specimen Description   Final    BLOOD RIGHT ANTECUBITAL Performed at [facility], [address], [place], [place] [address]    Special Requests   Final    Blood Culture adequate volume BOTTLES DRAWN AEROBIC AND ANAEROBIC Performed at [facility], [address], [place], [place] [address]    Culture   Final    NO GROWTH 2 DAYS Performed at [facility], [address] [place], [place] [address]    Report Status PENDING  Incomplete  Blood Culture (routine x 2)     Status: None (Preliminary result)   Collection Time: 12/18/22  4:42 AM   Specimen: BLOOD  Result Value Ref Range Status   Specimen Description   Final  BLOOD LEFT ANTECUBITAL Performed at [facility], [address], [place], [place] [address]    Special  Requests   Final    Blood Culture adequate volume BOTTLES DRAWN AEROBIC AND ANAEROBIC Performed at [facility], [address], [place], [place] [address]    Culture   Final    NO GROWTH 2 DAYS Performed at [facility], [address] [place], [place] [address]    Report Status PENDING  Incomplete  Resp panel by RT-PCR (RSV, Flu A&B, Covid) Anterior Nasal Swab     Status: None   Collection Time: 12/18/22  4:43 AM   Specimen: Anterior Nasal Swab  Result Value Ref Range Status   SARS Coronavirus 2 by RT PCR NEGATIVE NEGATIVE Final    Comment: (NOTE) SARS-CoV-2 target nucleic acids are NOT DETECTED.  The SARS-CoV-2 RNA is generally detectable in upper respiratory specimens during the acute phase of infection. The lowest concentration of SARS-CoV-2 viral copies this assay can detect is 138 copies/mL. A negative result does not preclude SARS-Cov-2 infection and should not be used as the sole basis for treatment or other patient management decisions. A negative result may occur with  improper specimen collection/handling, submission of specimen other than nasopharyngeal swab, presence of viral mutation(s) within the areas targeted by this assay, and inadequate number of viral copies(<138 copies/mL). A negative result must be combined with clinical observations, patient history, and epidemiological information. The expected result is Negative.  Fact Sheet for Patients:  BloggerCourse.com  Fact Sheet for Healthcare Providers:  SeriousBroker.it  This test is no t yet approved or cleared by the [place] FDA and  has been authorized for detection and/or diagnosis of SARS-CoV-2 by FDA under an Emergency Use Authorization (EUA). This EUA will remain  in effect (meaning this test can be used) for the duration of the COVID-19 declaration under Section 564(b)(1) of the Act, 21 U.S.C.section 360bbb-3(b)(1),  unless the authorization is terminated  or revoked sooner.       Influenza A by PCR NEGATIVE NEGATIVE Final   Influenza B by PCR NEGATIVE NEGATIVE Final    Comment: (NOTE) The Xpert Xpress SARS-CoV-2/FLU/RSV plus assay is intended as an aid in the diagnosis of influenza from Nasopharyngeal swab specimens and should not be used as a sole basis for treatment. Nasal washings and aspirates are unacceptable for Xpert Xpress SARS-CoV-2/FLU/RSV testing.  Fact Sheet for Patients: BloggerCourse.com  Fact Sheet for Healthcare Providers: SeriousBroker.it  This test is not yet approved or cleared by the [place] FDA and has been authorized for detection and/or diagnosis of SARS-CoV-2 by FDA under an Emergency Use Authorization (EUA). This EUA will remain in effect (meaning this test can be used) for the duration of the COVID-19 declaration under Section 564(b)(1) of the Act, 21 U.S.C. section 360bbb-3(b)(1), unless the authorization is terminated or revoked.     Resp Syncytial Virus by PCR NEGATIVE NEGATIVE Final    Comment: (NOTE) Fact Sheet for Patients: BloggerCourse.com  Fact Sheet for Healthcare Providers: SeriousBroker.it  This test is not yet approved or cleared by the [place] FDA and has been authorized for detection and/or diagnosis of SARS-CoV-2 by FDA under an Emergency Use Authorization (EUA). This EUA will remain in effect (meaning this test can be used) for the duration of the COVID-19 declaration under Section 564(b)(1) of the Act, 21 U.S.C. section 360bbb-3(b)(1), unless the authorization is terminated or revoked.  Performed at [facility], [address] [place], [place] [address]   MRSA Next Gen by PCR, Nasal     Status: None   Collection Time: 12/18/22  5:40 PM   Specimen: Nasal Mucosa; Nasal Swab  Result Value Ref Range Status    MRSA by PCR Next Gen NOT DETECTED NOT DETECTED Final    Comment: (NOTE) The GeneXpert MRSA Assay (FDA approved for NASAL specimens only), is one component of a comprehensive MRSA colonization surveillance program. It is not intended to diagnose MRSA infection nor to guide or monitor treatment for MRSA infections. Test performance is not FDA approved in patients less than 51 years old. Performed at [facility], [address], [place], [place] [address]     Antimicrobials: Anti-infectives (From admission, onward)    Start     Dose/Rate Route Frequency Ordered Stop   12/19/22 1200  ceFEPIme (MAXIPIME) 2 g in sodium chloride 0.9 % 100 mL IVPB        2 g 200 mL/hr over 30 Minutes Intravenous Every 8 hours 12/19/22 1051     12/19/22 0800  vancomycin (VANCOCIN) IVPB 1000 mg/200 mL premix  Status:  Discontinued        1,000 mg 100 mL/hr over 120 Minutes Intravenous Every 24 hours 12/18/22 1903 12/19/22 0842   12/19/22 0600  vancomycin (VANCOCIN) IVPB 1000 mg/200 mL premix  Status:  Discontinued        1,000 mg 200 mL/hr over 60 Minutes Intravenous Every 24 hours 12/18/22 0535 12/18/22 1903   12/18/22 1600  ceFEPIme (MAXIPIME) 2 g in sodium chloride 0.9 % 100 mL IVPB  Status:  Discontinued        2 g 200 mL/hr over 30 Minutes Intravenous Every 12 hours 12/18/22 0535 12/19/22 1051   12/18/22 0515  vancomycin (VANCOCIN) IVPB 1000 mg/200 mL premix        1,000 mg 200 mL/hr over 60 Minutes Intravenous  Once 12/18/22 0505 12/18/22 0645   12/18/22 0515  ceFEPIme (MAXIPIME) 2 g in sodium chloride 0.9 % 100 mL IVPB        2 g 200 mL/hr over 30 Minutes Intravenous  Once 12/18/22 0505 12/18/22 0601      Culture/Microbiology    Component Value Date/Time   SDES  12/18/2022 0442    BLOOD RIGHT ANTECUBITAL Performed at [facility], [address], [place], [place] [address]    SDES  12/18/2022 [phone]    BLOOD LEFT ANTECUBITAL Performed at [facility], [address], [place], [place] [address]    [facility]  12/18/2022 [phone]    Blood Culture adequate volume BOTTLES DRAWN AEROBIC AND ANAEROBIC Performed at [facility], [address], [place], [place] [address]    [facility]  12/18/2022 [phone]    Blood Culture adequate volume BOTTLES DRAWN AEROBIC AND ANAEROBIC Performed at [facility], [address], [place], [place] [address]    CULT  12/18/2022 [phone]    NO GROWTH 2 DAYS Performed at [facility], [address] [place], [place] [address]    CULT  12/18/2022 [phone]    NO GROWTH 2 DAYS Performed at [facility], [address] [place], [place] [address]    REPTSTATUS PENDING 12/18/2022 6606   REPTSTATUS PENDING 12/18/2022 0442   Other culture-see note  Radiology Studies: No results found.   LOS: 2 days   Total time spent in review of labs and imaging, patient evaluation, formulation of plan, documentation and  communication with family: 50  minutes  Lanae Boast, MD [facility]  12/20/2022, 10:35 AM

## 2022-12-21 DIAGNOSIS — J9601 Acute respiratory failure with hypoxia: Secondary | ICD-10-CM | POA: Diagnosis not present

## 2022-12-21 LAB — BASIC METABOLIC PANEL
Anion gap: 8 (ref 5–15)
BUN: 18 mg/dL (ref 6–20)
CO2: 26 mmol/L (ref 22–32)
Calcium: 8.2 mg/dL — ABNORMAL LOW (ref 8.9–10.3)
Chloride: 105 mmol/L (ref 98–111)
Creatinine, Ser: 1.32 mg/dL — ABNORMAL HIGH (ref 0.44–1.00)
GFR, Estimated: 54 mL/min — ABNORMAL LOW (ref >60)
Glucose, Bld: 106 mg/dL — ABNORMAL HIGH (ref 70–99)
Potassium: 4 mmol/L (ref 3.5–5.1)
Sodium: 139 mmol/L (ref 135–145)

## 2022-12-21 LAB — CBC
HCT: 32.8 % — ABNORMAL LOW (ref 36.0–46.0)
Hemoglobin: 10.4 g/dL — ABNORMAL LOW (ref 12.0–15.0)
MCH: 29.8 pg (ref 26.0–34.0)
MCHC: 31.7 g/dL (ref 30.0–36.0)
MCV: 94 fL (ref 80.0–100.0)
Platelets: 223 10*3/uL (ref 150–400)
RBC: 3.49 MIL/uL — ABNORMAL LOW (ref 3.87–5.11)
RDW: 13.8 % (ref 11.5–15.5)
WBC: 6 10*3/uL (ref 4.0–10.5)
nRBC: 0 % (ref 0.0–0.2)

## 2022-12-21 LAB — GLUCOSE, CAPILLARY
Glucose-Capillary: 104 mg/dL — ABNORMAL HIGH (ref 70–99)
Glucose-Capillary: 115 mg/dL — ABNORMAL HIGH (ref 70–99)
Glucose-Capillary: 240 mg/dL — ABNORMAL HIGH (ref 70–99)
Glucose-Capillary: 244 mg/dL — ABNORMAL HIGH (ref 70–99)
Glucose-Capillary: 246 mg/dL — ABNORMAL HIGH (ref 70–99)

## 2022-12-21 LAB — EXPECTORATED SPUTUM ASSESSMENT W GRAM STAIN, RFLX TO RESP C

## 2022-12-21 MED ORDER — SODIUM CHLORIDE 0.9 % IV SOLN
500.0000 mg | INTRAVENOUS | Status: AC
  Administered 2022-12-21 – 2022-12-23 (×3): 500 mg via INTRAVENOUS
  Filled 2022-12-21 (×4): qty 5

## 2022-12-21 NOTE — Plan of Care (Signed)
  Problem: Education: Goal: Ability to describe self-care measures that may prevent or decrease complications (Diabetes Survival Skills Education) will improve Outcome: Progressing Goal: Individualized Educational Video(s) Outcome: Progressing   Problem: Coping: Goal: Ability to adjust to condition or change in health will improve Outcome: Progressing   Problem: Health Behavior/Discharge Planning: Goal: Ability to manage health-related needs will improve Outcome: Progressing   Problem: Nutritional: Goal: Maintenance of adequate nutrition will improve Outcome: Progressing Goal: Progress toward achieving an optimal weight will improve Outcome: Progressing   Problem: Skin Integrity: Goal: Risk for impaired skin integrity will decrease Outcome: Progressing   Problem: Tissue Perfusion: Goal: Adequacy of tissue perfusion will improve Outcome: Progressing   Problem: Education: Goal: Knowledge of General Education information will improve Description: Including pain rating scale, medication(s)/side effects and non-pharmacologic comfort measures Outcome: Progressing   Problem: Clinical Measurements: Goal: Ability to maintain clinical measurements within normal limits will improve Outcome: Progressing Goal: Will remain free from infection Outcome: Progressing Goal: Diagnostic test results will improve Outcome: Progressing Goal: Respiratory complications will improve Outcome: Progressing Goal: Cardiovascular complication will be avoided Outcome: Progressing   Problem: Activity: Goal: Risk for activity intolerance will decrease Outcome: Progressing   Problem: Nutrition: Goal: Adequate nutrition will be maintained Outcome: Progressing   Problem: Coping: Goal: Level of anxiety will decrease Outcome: Progressing   Problem: Elimination: Goal: Will not experience complications related to bowel motility Outcome: Progressing Goal: Will not experience complications related to  urinary retention Outcome: Progressing   Problem: Pain Management: Goal: General experience of comfort will improve Outcome: Progressing   Problem: Safety: Goal: Ability to remain free from injury will improve Outcome: Progressing   Problem: Skin Integrity: Goal: Risk for impaired skin integrity will decrease Outcome: Progressing

## 2022-12-21 NOTE — Progress Notes (Signed)
Pharmacy Antibiotic Note  Tracey Perkins is a 37 y.o. female admitted on 12/18/2022 with pneumonia.  Pharmacy has been consulted for Vanco, Cefepime dosing.  ID: PNA (Augmentin, Azithro,  PTA)  - Tmax 101.2 still spiking fevers x 3d ,WBC 6, Scr 1.32  Antimicrobials this admission:  12/1 Cefepime >> 12/1 Vanc >> 12/4 Azithro x 3d  Dose adjustments this admission:  12/1: Vanc 1g q24h (eAUC 541, SCr 1.4) 12/2: Same dose calculated today (AUC 481, Scr 1.23) 12/4: Same dose calculated today(AUC 513, Scr 1.32)  Microbiology results:  12/1 BCx: NGTD 12/1 Resp panel: COVD -, Influenza A/B -, RSV -  12/1 MRSA PCR: neg  Plan: 12/4: Same dose calculated today(AUC 513, Scr 1.32) Cefepime 2g incr to q8hr Add Azithro x 3d  Height: 5\' 3"  (160 cm) Weight: 86.9 kg (191 lb 9.3 oz) IBW/kg (Calculated) : 52.4  Temp (24hrs), Avg:99.6 F (37.6 C), Min:98.7 F (37.1 C), Max:101.2 F (38.4 C)  Recent Labs  Lab 12/18/22 0443 12/18/22 0607 12/19/22 0446 12/20/22 0458 12/21/22 0514 12/21/22 0640  WBC 7.9  --  5.8 5.3  --  6.0  CREATININE 1.40*  --  1.23* 1.25* 1.32*  --   LATICACIDVEN  --  0.3*  --   --   --   --     Estimated Creatinine Clearance: 61.6 mL/min (A) (by C-G formula based on SCr of 1.32 mg/dL (H)).    Allergies  Allergen Reactions   Banana Swelling and Other (See Comments)    Mouth burning and swollen   Bactrim [Sulfamethoxazole-Trimethoprim] Hives   Doxycycline Hives   Gentian Violet Other (See Comments)    Mouth burns   Gentian Violet-Proflavine Sulfate [Triple Dye] Other (See Comments)    Mouth burns   Mold Extract [Trichophyton] Other (See Comments)    Hay Fever   Morphine And Codeine Hives   Other Hives, Itching and Other (See Comments)    "Hay fever, dust and pollen."  Per patient.- itching, runny nose   Sulfa Antibiotics    Vancomycin Other (See Comments)    Red man syndrome    Harvest Stanco S. [name], PharmD, BCPS Clinical Staff  Pharmacist [email or web address]  Pasty Spillers 12/21/2022 9:47 AM

## 2022-12-21 NOTE — Progress Notes (Signed)
   12/21/22 2332  BiPAP/CPAP/SIPAP  BiPAP/CPAP/SIPAP Pt Type Adult  BiPAP/CPAP/SIPAP  (home cpap)  Mask Type Nasal mask  Patient Home Equipment Yes  Auto Titrate Yes

## 2022-12-21 NOTE — Progress Notes (Signed)
PROGRESS NOTE Tracey Perkins  ZOX:[id number] DOB: 13-Oct-1985 DOA: 12/18/2022 PCP: Chilton Greathouse, MD  Brief Narrative/Hospital Course: 37 y.o. female with medical history significant of asthma, brain tumor as a child medulloblastoma, history of TBI as an adult, history of CVA, mild cognitive impairment, postconcussion syndrome, vision changes, type 1 diabetes, diabetic gastroparesis, intractable nausea and vomiting, diabetic peripheral neuropathy, eczema, seasonal allergies, food allergy,hypothyroidism, OSA on CPAP who has been having cough for the past 2 weeks associated with wheezing, progressively worse dyspnea and productive cough of greenish sputum.  She was taken to the urgent care on Thursday started on Augmentin, developed a fever  on saturda  and PCP was called, a Zithromax was added to her regimen. 12/1 her mother woke up, she went to check on her and found her to be febrile with a fever of 104 F and her O2 sat was 86%.  She was not wearing her CPAP last night.   In ED febrile 102.9 tachycardic BP stable 90% on room air.  Given supplemental oxygen, Lab work: CBC, PT/INR/PTT, magnesium and phosphorus were normal.  Negative coronavirus, influenza and RSV PCR.  Lactic acid 0.3 mmol/L.  Negative serum pregnancy test.  CMP showed a 734, chloride 97 mmol/L.  Glucose 158, creatinine 1.40 and calcium 8.5 mg/dL.  The rest of the CMP measurements were normal. Cxr>>  and CTA chest no evidence of PE.  Bilateral upper lobe and right middle lobe infiltrates, likely due to pneumonia.  Mild bilateral hilar and mediastinal lymphadenopathy, nonspecific but likely reactive in etiology.  Chest CT follow-up recommended in 3 months Patient received supplemental oxygen, normal saline 1000 mL bolus, ibuprofen 600 mg p.o. x 1, acetaminophen 1000 mg p.o., was started on cefepime and vancomycin.  I added LR 1 L bolus, followed by NS at 125 mL/h   Subjective: Patient seen and examined Resting comfortably was  complaining of bodyaches earlier Mother at the bedside Patient again had fever of 101.2 yesterday evening Labs otherwise stable WBC down creatinine slightly up 1.3  Assessment and Plan: Principal Problem:   Acute respiratory failure with hypoxia (HCC) Active Problems:   Type 1 diabetes (HCC)   Gastroesophageal reflux disease   Chronic lymphocytic thyroiditis   Mild intermittent asthma with acute exacerbation   Anxiety   Mild episode of recurrent major depressive disorder (HCC)   Mild cognitive impairment   Diabetic neuropathy (HCC)   Hypothyroidism   Insulin pump status   Class 1 obesity   Multifocal pneumonia   Hyperlipidemia   Acute hypoxic respiratory failure Multifocal pneumonia Moderate intermittent asthma with acute exacerbation: recently on Azithromycin and Augmentin at home. In ED CTA and chest x-ray showed multifocal pneumonia. Having intermittent fever, blood culture NGTD, sputum culture pending,MRSA swab neg. On cefepime, vancomycin was discontinued due to concern for "red man" syndrome.  Will add azithromycin iv and check respiratory virus panel.  If febrile again will obtain chest x-ray. Continue bronchodilators as needed, continue Pulmicort antitussives I-S and encourage ambulation. Coint Flonase for his nasal Recent Labs  Lab 12/18/22 0443 12/18/22 0607 12/19/22 0446 12/20/22 0458 12/21/22 0640  WBC 7.9  --  5.8 5.3 6.0  LATICACIDVEN  --  0.3*  --   --   --     Type 1 diabetes mellitus on insulin pump: Diabetic neuropathy: Blood sugar remains fairly controlled, on admission uncontrolled at 247. Continue on her insulin pump and monitor Recent Labs  Lab 12/20/22 1141 12/20/22 1625 12/20/22 2139 12/21/22 0318 12/21/22 0727  GLUCAP  289* 290* 96 104* 115*   Mild AKI: Creatinine fluctuating 1-1.3.  Baseline 1.0 on admission 1.4.  Encourage oral hydration Recent Labs    12/22/21 0117 12/23/21 0513 03/27/22 1415 03/27/22 1442 10/14/22 0630  12/18/22 0443 12/19/22 0446 12/20/22 0458 12/21/22 0514  BUN 14 8 12   --  8 16 15 17 18   CREATININE 1.04* 1.12* 1.06*  --  1.09* 1.40* 1.23* 1.25* 1.32*  CO2 29 26 28   --  26 28 24 24 26   K 3.8 3.5 3.4*   < > 3.9 3.7 3.9 3.5 4.0   < > = values in this interval not displayed.    GERD: On PPI  Chronic lymphocytic thyroiditis with hypothyroidism Continue synthroid.  Hyperlipidemia:  Continue statin  Mild cognitive impairment with history of multiple CNS insults: Supportive care.  Mother is a caregiver at bedside.  Obesity w/ ZOX:[id number] Body mass index is 33.94 kg/m. : Will benefit with PCP follow-up, weight loss  healthy lifestyle and cont cpap   DVT prophylaxis: enoxaparin (LOVENOX) injection 40 mg Start: 12/18/22 2200 Code Status:   Code Status: Full Code Family Communication: plan of care discussed with patient/mother at bedside. Patient status is: Inpatient because of respiratory failure Level of care: Med-Surg   Dispo: The patient is from: Home with mother            Anticipated disposition: Home once afebrile for at least 24 hours  Objective: Vitals last 24 hrs: Vitals:   12/20/22 1851 12/20/22 2136 12/21/22 0000 12/21/22 0518  BP:  118/86  (!) 125/94  Pulse:  (!) 115 99 94  Resp:  20  18  Temp: 98.9 F (37.2 C) 99.8 F (37.7 C)  99.3 F (37.4 C)  TempSrc: Oral Oral  Oral  SpO2:  (!) 88% 92% 96%  Weight:      Height:       Weight change:   Physical Examination: General exam: Resting/sleeping HEENT:Oral mucosa moist, Ear/Nose WNL grossly Respiratory system: Bilaterally clear BS,no use of accessory muscle Cardiovascular system: S1 & S2 +, No JVD. Gastrointestinal system: Abdomen soft,NT,ND, BS+ Nervous System: Alert, awake, moving all extremities,and following commands. Extremities: LE edema neg,distal peripheral pulses palpable and warm.  Skin: No rashes,no icterus. MSK: Normal muscle bulk,tone, power   Medications reviewed:  Scheduled Meds:   aspirin EC  81 mg Oral Daily   citalopram  5 mg Oral QHS   enoxaparin (LOVENOX) injection  40 mg Subcutaneous Q24H   fluticasone  2 spray Each Nare Daily   gabapentin  300 mg Oral Daily   gabapentin  600 mg Oral QHS   insulin pump   Subcutaneous TID WC, HS, 0200   levothyroxine  75 mcg Oral QAC breakfast   lubiprostone  16 mcg Oral Q breakfast   magnesium oxide  800 mg Oral QHS   montelukast  10 mg Oral QHS   pantoprazole  40 mg Oral Daily   potassium chloride  10 mEq Oral Daily   pravastatin  20 mg Oral QPC supper   saccharomyces boulardii  250 mg Oral Daily   Continuous Infusions:  azithromycin     ceFEPime (MAXIPIME) IV Stopped (12/21/22 9811)      Diet Order             Diet heart healthy/carb modified Room service appropriate? Yes; Fluid consistency: Thin  Diet effective now                  Intake/Output Summary (Last 24  hours) at 12/21/2022 0845 Last data filed at 12/21/2022 0511 Gross per 24 hour  Intake 1220 ml  Output --  Net 1220 ml   Net IO Since Admission: 5,250.92 mL [12/21/22 0845]  Wt Readings from Last 3 Encounters:  12/18/22 86.9 kg  10/18/22 86.9 kg  10/14/22 83.9 kg     Unresulted Labs (From admission, onward)     Start     Ordered   12/21/22 0845  Respiratory (~20 pathogens) panel by PCR  (Respiratory panel by PCR (~20 pathogens, ~24 hr TAT)  w precautions)  Once,   R        12/21/22 0844   12/20/22 0849  Expectorated Sputum Assessment w Gram Stain, Rflx to Resp Cult  Once,   R        12/20/22 0848   12/20/22 0500  Basic metabolic panel  Daily,   R      12/19/22 1055   12/20/22 0500  CBC  Daily,   R      12/19/22 1055          Data Reviewed: I have personally reviewed following labs and imaging studies CBC: Recent Labs  Lab 12/18/22 0443 12/19/22 0446 12/20/22 0458 12/21/22 0640  WBC 7.9 5.8 5.3 6.0  NEUTROABS 5.6  --   --   --   HGB 12.3 9.8* 10.6* 10.4*  HCT 36.8 29.4* 32.4* 32.8*  MCV 91.5 92.7 92.3 94.0  PLT 278 208  210 223   Basic Metabolic Panel:  Recent Labs  Lab 12/18/22 0443 12/19/22 0446 12/20/22 0458 12/21/22 0514  NA 134* 136 137 139  K 3.7 3.9 3.5 4.0  CL 97* 105 104 105  CO2 28 24 24 26   GLUCOSE 158* 136* 85 106*  BUN 16 15 17 18   CREATININE 1.40* 1.23* 1.25* 1.32*  CALCIUM 8.5* 7.5* 7.6* 8.2*  MG 2.2  --   --   --   PHOS 2.7  --   --   --    GFR: Estimated Creatinine Clearance: 61.6 mL/min (A) (by C-G formula based on SCr of 1.32 mg/dL (H)). Liver Function Tests:  Recent Labs  Lab 12/18/22 0443 12/19/22 0446  AST 27 56*  ALT 23 41  ALKPHOS 91 85  BILITOT 0.3 0.2  PROT 7.7 6.0*  ALBUMIN 3.9 3.0*   No results for input(s): "LIPASE", "AMYLASE" in the last 168 hours. No results for input(s): "AMMONIA" in the last 168 hours. Coagulation Profile:  Recent Labs  Lab 12/18/22 0443  INR 1.1   No results for input(s): "PROBNP" in the last 168 hours.  No results for input(s): "HGBA1C" in the last 72 hours. Recent Labs  Lab 12/20/22 1141 12/20/22 1625 12/20/22 2139 12/21/22 0318 12/21/22 0727  GLUCAP 289* 290* 96 104* 115*   No results for input(s): "CHOL", "HDL", "LDLCALC", "TRIG", "CHOLHDL", "LDLDIRECT" in the last 72 hours. No results for input(s): "TSH", "T4TOTAL", "FREET4", "T3FREE", "THYROIDAB" in the last 72 hours. Sepsis Labs: Recent Labs  Lab 12/18/22 0607  LATICACIDVEN 0.3*    Recent Results (from the past 240 hour(s))  Blood Culture (routine x 2)     Status: None (Preliminary result)   Collection Time: 12/18/22  4:42 AM   Specimen: BLOOD  Result Value Ref Range Status   Specimen Description   Final    BLOOD RIGHT ANTECUBITAL Performed at [facility], [address] [place], [place] [address]    Special Requests   Final    Blood  Culture adequate volume BOTTLES DRAWN AEROBIC AND ANAEROBIC Performed at [facility], [address] [place], [place] [address]    Culture   Final    NO GROWTH 3 DAYS Performed at  [facility], [address] [place], [place] [address]    Report Status PENDING  Incomplete  Blood Culture (routine x 2)     Status: None (Preliminary result)   Collection Time: 12/18/22  4:42 AM   Specimen: BLOOD  Result Value Ref Range Status   Specimen Description   Final    BLOOD LEFT ANTECUBITAL Performed at [facility], [address], [place], [place] [address]    Special Requests   Final    Blood Culture adequate volume BOTTLES DRAWN AEROBIC AND ANAEROBIC Performed at [facility], [address] [place], [place] [address]    Culture   Final    NO GROWTH 3 DAYS Performed at [facility], [address] [place], [place] [address]    Report Status PENDING  Incomplete  Resp panel by RT-PCR (RSV, Flu A&B, Covid) Anterior Nasal Swab     Status: None   Collection Time: 12/18/22  4:43 AM   Specimen: Anterior Nasal Swab  Result Value Ref Range Status   SARS Coronavirus 2 by RT PCR NEGATIVE NEGATIVE Final    Comment: (NOTE) SARS-CoV-2 target nucleic acids are NOT DETECTED.  The SARS-CoV-2 RNA is generally detectable in upper respiratory specimens during the acute phase of infection. The lowest concentration of SARS-CoV-2 viral copies this assay can detect is 138 copies/mL. A negative result does not preclude SARS-Cov-2 infection and should not be used as the sole basis for treatment or other patient management decisions. A negative result may occur with  improper specimen collection/handling, submission of specimen other than nasopharyngeal swab, presence of viral mutation(s) within the areas targeted by this assay, and inadequate number of viral copies(<138 copies/mL). A negative result must be combined with clinical observations, patient history, and epidemiological information. The expected result is Negative.  Fact Sheet for Patients:  BloggerCourse.com  Fact Sheet for Healthcare Providers:   SeriousBroker.it  This test is no t yet approved or cleared by the [place] FDA and  has been authorized for detection and/or diagnosis of SARS-CoV-2 by FDA under an Emergency Use Authorization (EUA). This EUA will remain  in effect (meaning this test can be used) for the duration of the COVID-19 declaration under Section 564(b)(1) of the Act, 21 U.S.C.section 360bbb-3(b)(1), unless the authorization is terminated  or revoked sooner.       Influenza A by PCR NEGATIVE NEGATIVE Final   Influenza B by PCR NEGATIVE NEGATIVE Final    Comment: (NOTE) The Xpert Xpress SARS-CoV-2/FLU/RSV plus assay is intended as an aid in the diagnosis of influenza from Nasopharyngeal swab specimens and should not be used as a sole basis for treatment. Nasal washings and aspirates are unacceptable for Xpert Xpress SARS-CoV-2/FLU/RSV testing.  Fact Sheet for Patients: BloggerCourse.com  Fact Sheet for Healthcare Providers: SeriousBroker.it  This test is not yet approved or cleared by the [place] FDA and has been authorized for detection and/or diagnosis of SARS-CoV-2 by FDA under an Emergency Use Authorization (EUA). This EUA will remain in effect (meaning this test can be used) for the duration of the COVID-19 declaration under Section 564(b)(1) of the Act, 21 U.S.C. section 360bbb-3(b)(1), unless the authorization is terminated or revoked.     Resp Syncytial Virus by PCR  NEGATIVE NEGATIVE Final    Comment: (NOTE) Fact Sheet for Patients: BloggerCourse.com  Fact Sheet for Healthcare Providers: SeriousBroker.it  This test is not yet approved or cleared by the [place] FDA and has been authorized for detection and/or diagnosis of SARS-CoV-2 by FDA under an Emergency Use Authorization (EUA). This EUA will remain in effect (meaning this test can be used) for  the duration of the COVID-19 declaration under Section 564(b)(1) of the Act, 21 U.S.C. section 360bbb-3(b)(1), unless the authorization is terminated or revoked.  Performed at [facility], [address], [place], [place] [address]   MRSA Next Gen by PCR, Nasal     Status: None   Collection Time: 12/18/22  5:40 PM   Specimen: Nasal Mucosa; Nasal Swab  Result Value Ref Range Status   MRSA by PCR Next Gen NOT DETECTED NOT DETECTED Final    Comment: (NOTE) The GeneXpert MRSA Assay (FDA approved for NASAL specimens only), is one component of a comprehensive MRSA colonization surveillance program. It is not intended to diagnose MRSA infection nor to guide or monitor treatment for MRSA infections. Test performance is not FDA approved in patients less than 41 years old. Performed at [facility], [address], [place], [place] [address]     Antimicrobials: Anti-infectives (From admission, onward)    Start     Dose/Rate Route Frequency Ordered Stop   12/21/22 0930  azithromycin (ZITHROMAX) 500 mg in sodium chloride 0.9 % 250 mL IVPB        500 mg 250 mL/hr over 60 Minutes Intravenous Every 24 hours 12/21/22 0845 12/24/22 0944   12/19/22 1200  ceFEPIme (MAXIPIME) 2 g in sodium chloride 0.9 % 100 mL IVPB        2 g 200 mL/hr over 30 Minutes Intravenous Every 8 hours 12/19/22 1051     12/19/22 0800  vancomycin (VANCOCIN) IVPB 1000 mg/200 mL premix  Status:  Discontinued        1,000 mg 100 mL/hr over 120 Minutes Intravenous Every 24 hours 12/18/22 1903 12/19/22 0842   12/19/22 0600  vancomycin (VANCOCIN) IVPB 1000 mg/200 mL premix  Status:  Discontinued        1,000 mg 200 mL/hr over 60 Minutes Intravenous Every 24 hours 12/18/22 0535 12/18/22 1903   12/18/22 1600  ceFEPIme (MAXIPIME) 2 g in sodium chloride 0.9 % 100 mL IVPB  Status:  Discontinued        2 g 200 mL/hr over 30 Minutes Intravenous Every 12 hours 12/18/22 0535 12/19/22 1051    12/18/22 0515  vancomycin (VANCOCIN) IVPB 1000 mg/200 mL premix        1,000 mg 200 mL/hr over 60 Minutes Intravenous  Once 12/18/22 0505 12/18/22 0645   12/18/22 0515  ceFEPIme (MAXIPIME) 2 g in sodium chloride 0.9 % 100 mL IVPB        2 g 200 mL/hr over 30 Minutes Intravenous  Once 12/18/22 0505 12/18/22 0601      Culture/Microbiology    Component Value Date/Time   SDES  12/18/2022 0442    BLOOD RIGHT ANTECUBITAL Performed at [facility], [address] [place], [place] [address]    SDES  12/18/2022 [phone]    BLOOD LEFT ANTECUBITAL Performed at [facility], [address], [place], [place] [address]    [facility]  12/18/2022 [phone]    Blood Culture adequate volume BOTTLES DRAWN AEROBIC AND ANAEROBIC Performed at [facility], [address] [place], [place] [address]    SPECREQUEST  12/18/2022 [phone]    Blood Culture adequate volume BOTTLES DRAWN AEROBIC AND ANAEROBIC Performed at [facility], [address] [place], [place] [address]    CULT  12/18/2022 [phone]    NO GROWTH 3 DAYS Performed at [facility], [address] [place], [place] [address]    CULT  12/18/2022 [phone]    NO GROWTH 3 DAYS Performed at [facility], [address], [place], [place] [address]    REPTSTATUS PENDING 12/18/2022 4132   REPTSTATUS PENDING 12/18/2022 0442   Other culture-see note  Radiology Studies: No results found.   LOS: 3 days   Total time spent in review of labs and imaging, patient evaluation, formulation of plan, documentation and communication with family: 50  minutes  Lanae Boast, MD [facility]  12/21/2022, 8:45 AM

## 2022-12-21 NOTE — Inpatient Diabetes Management (Signed)
Gave pt sample Dexcom G7 CGM.  Thank you. Ailene Ards, RD, LDN, CDCES Inpatient Diabetes Coordinator [phone]

## 2022-12-21 NOTE — Progress Notes (Signed)
   12/21/22 0130  BiPAP/CPAP/SIPAP  BiPAP/CPAP/SIPAP Pt Type Adult

## 2022-12-21 NOTE — Progress Notes (Signed)
RT note: Called by floor, notified that 1401 had question about home CPAP, upon arrival pts. family member stated that, "pts. home CPAP seemed to obtain more pressure than usual per pt. while wearing".  Pt. spoken with after returning from [facility] along with family member, recently had pressure adjusted by home care associate, made both aware that staff is unable to adjust settings and should make contact with home care company member about situation, had pt. place back on 2 lpm n/c from bedside and contact as soon as able.

## 2022-12-21 NOTE — Plan of Care (Signed)
  Problem: Metabolic: Goal: Ability to maintain appropriate glucose levels will improve Outcome: Progressing   Problem: Nutritional: Goal: Progress toward achieving an optimal weight will improve Outcome: Progressing   Problem: Clinical Measurements: Goal: Diagnostic test results will improve Outcome: Progressing   Problem: Activity: Goal: Risk for activity intolerance will decrease Outcome: Progressing   Problem: Education: Goal: Ability to describe self-care measures that may prevent or decrease complications (Diabetes Survival Skills Education) will improve Outcome: Adequate for Discharge   Problem: Coping: Goal: Ability to adjust to condition or change in health will improve Outcome: Adequate for Discharge   Problem: Fluid Volume: Goal: Ability to maintain a balanced intake and output will improve Outcome: Adequate for Discharge   Problem: Health Behavior/Discharge Planning: Goal: Ability to identify and utilize available resources and services will improve Outcome: Adequate for Discharge   Problem: Nutritional: Goal: Maintenance of adequate nutrition will improve Outcome: Adequate for Discharge   Problem: Skin Integrity: Goal: Risk for impaired skin integrity will decrease Outcome: Adequate for Discharge   Problem: Tissue Perfusion: Goal: Adequacy of tissue perfusion will improve Outcome: Adequate for Discharge   Problem: Education: Goal: Knowledge of General Education information will improve Description: Including pain rating scale, medication(s)/side effects and non-pharmacologic comfort measures Outcome: Adequate for Discharge   Problem: Health Behavior/Discharge Planning: Goal: Ability to manage health-related needs will improve Outcome: Adequate for Discharge   Problem: Clinical Measurements: Goal: Will remain free from infection Outcome: Adequate for Discharge Goal: Respiratory complications will improve Outcome: Adequate for Discharge Goal:  Cardiovascular complication will be avoided Outcome: Adequate for Discharge   Problem: Nutrition: Goal: Adequate nutrition will be maintained Outcome: Adequate for Discharge   Problem: Coping: Goal: Level of anxiety will decrease Outcome: Adequate for Discharge   Problem: Elimination: Goal: Will not experience complications related to bowel motility Outcome: Adequate for Discharge   Problem: Pain Management: Goal: General experience of comfort will improve Outcome: Adequate for Discharge   Problem: Safety: Goal: Ability to remain free from injury will improve Outcome: Adequate for Discharge   Problem: Skin Integrity: Goal: Risk for impaired skin integrity will decrease Outcome: Adequate for Discharge

## 2022-12-22 DIAGNOSIS — J9601 Acute respiratory failure with hypoxia: Secondary | ICD-10-CM | POA: Diagnosis not present

## 2022-12-22 LAB — RESPIRATORY PANEL BY PCR

## 2022-12-22 LAB — BASIC METABOLIC PANEL
Anion gap: 8 (ref 5–15)
BUN: 16 mg/dL (ref 6–20)
CO2: 27 mmol/L (ref 22–32)
Calcium: 8.6 mg/dL — ABNORMAL LOW (ref 8.9–10.3)
Chloride: 103 mmol/L (ref 98–111)
Creatinine, Ser: 1.15 mg/dL — ABNORMAL HIGH (ref 0.44–1.00)
GFR, Estimated: >60 mL/min (ref >60)
Glucose, Bld: 156 mg/dL — ABNORMAL HIGH (ref 70–99)
Potassium: 3.8 mmol/L (ref 3.5–5.1)
Sodium: 138 mmol/L (ref 135–145)

## 2022-12-22 LAB — CBC
HCT: 34.5 % — ABNORMAL LOW (ref 36.0–46.0)
Hemoglobin: 11.1 g/dL — ABNORMAL LOW (ref 12.0–15.0)
MCH: 30.2 pg (ref 26.0–34.0)
MCHC: 32.2 g/dL (ref 30.0–36.0)
MCV: 93.8 fL (ref 80.0–100.0)
Platelets: 245 10*3/uL (ref 150–400)
RBC: 3.68 MIL/uL — ABNORMAL LOW (ref 3.87–5.11)
RDW: 13.7 % (ref 11.5–15.5)
WBC: 5.9 10*3/uL (ref 4.0–10.5)
nRBC: 0 % (ref 0.0–0.2)

## 2022-12-22 LAB — GLUCOSE, CAPILLARY
Glucose-Capillary: 118 mg/dL — ABNORMAL HIGH (ref 70–99)
Glucose-Capillary: 203 mg/dL — ABNORMAL HIGH (ref 70–99)
Glucose-Capillary: 216 mg/dL — ABNORMAL HIGH (ref 70–99)
Glucose-Capillary: 218 mg/dL — ABNORMAL HIGH (ref 70–99)

## 2022-12-22 MED ORDER — IBUPROFEN 200 MG PO TABS: 400.0000 mg | ORAL_TABLET | Freq: Three times a day (TID) | ORAL | Status: DC | PRN

## 2022-12-22 NOTE — Progress Notes (Signed)
PROGRESS NOTE Tracey Perkins  TFT:[id number] DOB: 11-18-1985 DOA: 12/18/2022 PCP: Chilton Greathouse, MD  Brief Narrative/Hospital Course: 37 y.o. female with medical history significant of asthma, brain tumor as a child medulloblastoma, history of TBI as an adult, history of CVA, mild cognitive impairment, postconcussion syndrome, vision changes, type 1 diabetes, diabetic gastroparesis, intractable nausea and vomiting, diabetic peripheral neuropathy, eczema, seasonal allergies, food allergy,hypothyroidism, OSA on CPAP who has been having cough for the past 2 weeks associated with wheezing, progressively worse dyspnea and productive cough of greenish sputum.  She was taken to the urgent care on Thursday started on Augmentin, developed a fever  on saturda  and PCP was called, a Zithromax was added to her regimen. 12/1 her mother woke up, she went to check on her and found her to be febrile with a fever of 104 F and her O2 sat was 86%.  She was not wearing her CPAP last night.   In ED febrile 102.9 tachycardic BP stable 90% on room air.  Given supplemental oxygen, Lab work: CBC, PT/INR/PTT, magnesium and phosphorus were normal.  Negative coronavirus, influenza and RSV PCR.  Lactic acid 0.3 mmol/L.  Negative serum pregnancy test.  CMP showed a 734, chloride 97 mmol/L.  Glucose 158, creatinine 1.40 and calcium 8.5 mg/dL.  The rest of the CMP measurements were normal. Cxr>>  and CTA chest no evidence of PE.  Bilateral upper lobe and right middle lobe infiltrates, likely due to pneumonia.  Mild bilateral hilar and mediastinal lymphadenopathy, nonspecific but likely reactive in etiology.  Chest CT follow-up recommended in 3 months Patient received supplemental oxygen, normal saline 1000 mL bolus, ibuprofen 600 mg p.o. x 1, acetaminophen 1000 mg p.o., was started on cefepime and vancomycin.  I added LR 1 L bolus, followed by NS at 125 mL/h Patient was admitted to [facility] for community-acquired pneumonia.  Antibiotics  adjusted Patient was having intubated fever.  Patient received vancomycin initially subsequently stopped due to possible "red man" syndrome, having intermittent fever respiratory virus panel obtained negative, blood culture negative, azithromycin added on cefepime.  At this time she has been afebrile since > 36 hrs and is medically stable for discharge   Subjective: Seen this morning Mother at the bedside patient was resting comfortably less body ache today and no fever since 12/3 Having low blood sugar in 70-90s overnight Labs appear stable overall.  Assessment and Plan: Principal Problem:   Acute respiratory failure with hypoxia (HCC) Active Problems:   Type 1 diabetes (HCC)   Gastroesophageal reflux disease   Chronic lymphocytic thyroiditis   Mild intermittent asthma with acute exacerbation   Anxiety   Mild episode of recurrent major depressive disorder (HCC)   Mild cognitive impairment   Diabetic neuropathy (HCC)   Hypothyroidism   Insulin pump status   Class 1 obesity   Multifocal pneumonia   Hyperlipidemia   Acute hypoxic respiratory failure Multifocal pneumonia Moderate intermittent asthma with acute exacerbation: recently on Azithromycin and Augmentin at home. In ED CTA and chest x-ray showed multifocal pneumonia. Having intermittent fever, blood culture NGTD, MRSA swab neg. On cefepime, vancomycin was discontinued due to concern for "red man" syndrome.  Added azithromycin, RVP panel negative sputum culture sent but overall she has been afebrile since 12/3 . Having body ache but improving, continue current bronchodilators Pulmicort I-S, flonase, Claritin encouraged mobility Recent Labs  Lab 12/18/22 0443 12/18/22 0607 12/19/22 0446 12/20/22 0458 12/21/22 0640 12/22/22 0543  WBC 7.9  --  5.8 5.3 6.0 5.9  LATICACIDVEN  --  0.3*  --   --   --   --     Type 1 diabetes mellitus on insulin pump: Diabetic neuropathy: Blood sugar remains fluctuating, on bit lower side but  no actual hypoglycemia during night.  Continue to monitor  on insulibn pump- mother well versed with management. Recent Labs  Lab 12/21/22 1131 12/21/22 1619 12/21/22 2036 12/22/22 0734 12/22/22 1133  GLUCAP 240* 244* 246* 118* 203*   Mild AKI: Creatinine fluctuating 1-1.3. b/l 1.0 on admission 1.4 and improved. Encourage oral hydration Recent Labs    12/23/21 0513 03/27/22 1415 03/27/22 1442 10/14/22 0630 12/18/22 0443 12/19/22 0446 12/20/22 0458 12/21/22 0514 12/22/22 0543  BUN 8 12  --  8 16 15 17 18 16   CREATININE 1.12* 1.06*  --  1.09* 1.40* 1.23* 1.25* 1.32* 1.15*  CO2 26 28  --  26 28 24 24 26 27   K 3.5 3.4*   < > 3.9 3.7 3.9 3.5 4.0 3.8   < > = values in this interval not displayed.    GERD: On PPI  Chronic lymphocytic thyroiditis with hypothyroidism Continue synthroid.  Hyperlipidemia:  Continue statin  Mild cognitive impairment with history of multiple CNS insults: Supportive care.  Mother is a caregiver at bedside and is involved.  Obesity w/ ZOX:[id number] Body mass index is 33.94 kg/m. : Will benefit with PCP follow-up, weight loss  healthy lifestyle and cont cpap   DVT prophylaxis: enoxaparin (LOVENOX) injection 40 mg Start: 12/18/22 2200 Code Status:   Code Status: Full Code Family Communication: plan of care discussed with patient/mother at bedside. Patient status is: Inpatient because of respiratory failure Level of care: Med-Surg   Dispo: The patient is from: Home with mother            Anticipated disposition: Consider discharge tomorrow if remains afebrile  and clinically stable Objective: Vitals last 24 hrs: Vitals:   12/22/22 0016 12/22/22 0413 12/22/22 0900 12/22/22 0930  BP: (!) 156/97 (!) 147/87 (!) 160/100 (!) 142/98  Pulse: 100 97 92   Resp: 18 18 19    Temp: 99.8 F (37.7 C) 99 F (37.2 C) 99.7 F (37.6 C)   TempSrc: Oral Oral Oral   SpO2: 93% 92% 94%   Weight:      Height:       Weight change:   Physical  Examination: General exam: Resting HEENT:Oral mucosa moist, Ear/Nose WNL grossly Respiratory system: Bilaterally clear BS,no use of accessory muscle Cardiovascular system: S1 & S2 +, No JVD. Gastrointestinal system: Abdomen soft,NT,ND, BS+ Nervous System:moving all extremities,and following commands. Extremities: LE edema neg,distal peripheral pulses palpable and warm.  Skin: No rashes,no icterus. MSK: Normal muscle bulk,tone, power   Medications reviewed:  Scheduled Meds:  aspirin EC  81 mg Oral Daily   citalopram  5 mg Oral QHS   enoxaparin (LOVENOX) injection  40 mg Subcutaneous Q24H   fluticasone  2 spray Each Nare Daily   gabapentin  300 mg Oral Daily   gabapentin  600 mg Oral QHS   insulin pump   Subcutaneous TID WC, HS, 0200   levothyroxine  75 mcg Oral QAC breakfast   lubiprostone  16 mcg Oral Q breakfast   magnesium oxide  800 mg Oral QHS   montelukast  10 mg Oral QHS   pantoprazole  40 mg Oral Daily   potassium chloride  10 mEq Oral Daily   pravastatin  20 mg Oral QPC supper   saccharomyces boulardii  250  mg Oral Daily   Continuous Infusions:  azithromycin 500 mg (12/22/22 0943)   ceFEPime (MAXIPIME) IV 2 g (12/22/22 1300)      Diet Order             Diet heart healthy/carb modified Room service appropriate? Yes; Fluid consistency: Thin  Diet effective now                  Intake/Output Summary (Last 24 hours) at 12/22/2022 1302 Last data filed at 12/22/2022 0900 Gross per 24 hour  Intake 1750.23 ml  Output --  Net 1750.23 ml   Net IO Since Admission: 7,001.15 mL [12/22/22 1302]  Wt Readings from Last 3 Encounters:  12/18/22 86.9 kg  10/18/22 86.9 kg  10/14/22 83.9 kg     Unresulted Labs (From admission, onward)     Start     Ordered   12/21/22 1313  Culture, Respiratory w Gram Stain  Once,   R        12/21/22 1313   12/20/22 0500  Basic metabolic panel  Daily,   R      12/19/22 1055   12/20/22 0500  CBC  Daily,   R      12/19/22 1055           Data Reviewed: I have personally reviewed following labs and imaging studies CBC: Recent Labs  Lab 12/18/22 0443 12/19/22 0446 12/20/22 0458 12/21/22 0640 12/22/22 0543  WBC 7.9 5.8 5.3 6.0 5.9  NEUTROABS 5.6  --   --   --   --   HGB 12.3 9.8* 10.6* 10.4* 11.1*  HCT 36.8 29.4* 32.4* 32.8* 34.5*  MCV 91.5 92.7 92.3 94.0 93.8  PLT 278 208 210 223 245   Basic Metabolic Panel:  Recent Labs  Lab 12/18/22 0443 12/19/22 0446 12/20/22 0458 12/21/22 0514 12/22/22 0543  NA 134* 136 137 139 138  K 3.7 3.9 3.5 4.0 3.8  CL 97* 105 104 105 103  CO2 28 24 24 26 27   GLUCOSE 158* 136* 85 106* 156*  BUN 16 15 17 18 16   CREATININE 1.40* 1.23* 1.25* 1.32* 1.15*  CALCIUM 8.5* 7.5* 7.6* 8.2* 8.6*  MG 2.2  --   --   --   --   PHOS 2.7  --   --   --   --    GFR: Estimated Creatinine Clearance: 70.7 mL/min (A) (by C-G formula based on SCr of 1.15 mg/dL (H)). Liver Function Tests:  Recent Labs  Lab 12/18/22 0443 12/19/22 0446  AST 27 56*  ALT 23 41  ALKPHOS 91 85  BILITOT 0.3 0.2  PROT 7.7 6.0*  ALBUMIN 3.9 3.0*   No results for input(s): "LIPASE", "AMYLASE" in the last 168 hours. No results for input(s): "AMMONIA" in the last 168 hours. Coagulation Profile:  Recent Labs  Lab 12/18/22 0443  INR 1.1   No results for input(s): "PROBNP" in the last 168 hours.  No results for input(s): "HGBA1C" in the last 72 hours. Recent Labs  Lab 12/21/22 1131 12/21/22 1619 12/21/22 2036 12/22/22 0734 12/22/22 1133  GLUCAP 240* 244* 246* 118* 203*   No results for input(s): "CHOL", "HDL", "LDLCALC", "TRIG", "CHOLHDL", "LDLDIRECT" in the last 72 hours. No results for input(s): "TSH", "T4TOTAL", "FREET4", "T3FREE", "THYROIDAB" in the last 72 hours. Sepsis Labs: Recent Labs  Lab 12/18/22 0607  LATICACIDVEN 0.3*    Recent Results (from the past 240 hour(s))  Blood Culture (routine x 2)  Status: None (Preliminary result)   Collection Time: 12/18/22  4:42 AM   Specimen: BLOOD   Result Value Ref Range Status   Specimen Description   Final    BLOOD RIGHT ANTECUBITAL Performed at [facility], [address] [place], [place] [address]    Special Requests   Final    Blood Culture adequate volume BOTTLES DRAWN AEROBIC AND ANAEROBIC Performed at [facility], [address] [place], [place] [address]    Culture   Final    NO GROWTH 4 DAYS Performed at [facility], [address], [place], [place] [address]    Report Status PENDING  Incomplete  Blood Culture (routine x 2)     Status: None (Preliminary result)   Collection Time: 12/18/22  4:42 AM   Specimen: BLOOD  Result Value Ref Range Status   Specimen Description   Final    BLOOD LEFT ANTECUBITAL Performed at [facility], [address], [place], [place] [address]    Special Requests   Final    Blood Culture adequate volume BOTTLES DRAWN AEROBIC AND ANAEROBIC Performed at [facility], [address] [place], [place] [address]    Culture   Final    NO GROWTH 4 DAYS Performed at [facility], [address], [place], [place] [address]    Report Status PENDING  Incomplete  Resp panel by RT-PCR (RSV, Flu A&B, Covid) Anterior Nasal Swab     Status: None   Collection Time: 12/18/22  4:43 AM   Specimen: Anterior Nasal Swab  Result Value Ref Range Status   SARS Coronavirus 2 by RT PCR NEGATIVE NEGATIVE Final    Comment: (NOTE) SARS-CoV-2 target nucleic acids are NOT DETECTED.  The SARS-CoV-2 RNA is generally detectable in upper respiratory specimens during the acute phase of infection. The lowest concentration of SARS-CoV-2 viral copies this assay can detect is 138 copies/mL. A negative result does not preclude SARS-Cov-2 infection and should not be used as the sole basis for treatment or other patient management decisions. A negative result may occur with  improper specimen collection/handling, submission of  specimen other than nasopharyngeal swab, presence of viral mutation(s) within the areas targeted by this assay, and inadequate number of viral copies(<138 copies/mL). A negative result must be combined with clinical observations, patient history, and epidemiological information. The expected result is Negative.  Fact Sheet for Patients:  BloggerCourse.com  Fact Sheet for Healthcare Providers:  SeriousBroker.it  This test is no t yet approved or cleared by the [place] FDA and  has been authorized for detection and/or diagnosis of SARS-CoV-2 by FDA under an Emergency Use Authorization (EUA). This EUA will remain  in effect (meaning this test can be used) for the duration of the COVID-19 declaration under Section 564(b)(1) of the Act, 21 U.S.C.section 360bbb-3(b)(1), unless the authorization is terminated  or revoked sooner.       Influenza A by PCR NEGATIVE NEGATIVE Final   Influenza B by PCR NEGATIVE NEGATIVE Final    Comment: (NOTE) The Xpert Xpress SARS-CoV-2/FLU/RSV plus assay is intended as an aid in the diagnosis of influenza from Nasopharyngeal swab specimens and should not be used as a sole basis for treatment. Nasal washings and aspirates are unacceptable for Xpert Xpress SARS-CoV-2/FLU/RSV testing.  Fact Sheet for Patients: BloggerCourse.com  Fact Sheet for Healthcare Providers: SeriousBroker.it  This test is not yet approved or cleared by the [place] FDA and has been  authorized for detection and/or diagnosis of SARS-CoV-2 by FDA under an Emergency Use Authorization (EUA). This EUA will remain in effect (meaning this test can be used) for the duration of the COVID-19 declaration under Section 564(b)(1) of the Act, 21 U.S.C. section 360bbb-3(b)(1), unless the authorization is terminated or revoked.     Resp Syncytial Virus by PCR NEGATIVE NEGATIVE Final     Comment: (NOTE) Fact Sheet for Patients: BloggerCourse.com  Fact Sheet for Healthcare Providers: SeriousBroker.it  This test is not yet approved or cleared by the [place] FDA and has been authorized for detection and/or diagnosis of SARS-CoV-2 by FDA under an Emergency Use Authorization (EUA). This EUA will remain in effect (meaning this test can be used) for the duration of the COVID-19 declaration under Section 564(b)(1) of the Act, 21 U.S.C. section 360bbb-3(b)(1), unless the authorization is terminated or revoked.  Performed at [facility], [address] [place], [place] [address]   MRSA Next Gen by PCR, Nasal     Status: None   Collection Time: 12/18/22  5:40 PM   Specimen: Nasal Mucosa; Nasal Swab  Result Value Ref Range Status   MRSA by PCR Next Gen NOT DETECTED NOT DETECTED Final    Comment: (NOTE) The GeneXpert MRSA Assay (FDA approved for NASAL specimens only), is one component of a comprehensive MRSA colonization surveillance program. It is not intended to diagnose MRSA infection nor to guide or monitor treatment for MRSA infections. Test performance is not FDA approved in patients less than 52 years old. Performed at [facility], [address], [place], [place] [address]   Respiratory (~20 pathogens) panel by PCR     Status: None   Collection Time: 12/21/22  8:45 AM   Specimen: Nasopharyngeal Swab; Respiratory  Result Value Ref Range Status   Adenovirus NOT DETECTED NOT DETECTED Final   Coronavirus 229E NOT DETECTED NOT DETECTED Final    Comment: (NOTE) The Coronavirus on the Respiratory Panel, DOES NOT test for the novel  Coronavirus (2019 nCoV)    Coronavirus HKU1 NOT DETECTED NOT DETECTED Final   Coronavirus NL63 NOT DETECTED NOT DETECTED Final   Coronavirus OC43 NOT DETECTED NOT DETECTED Final   Metapneumovirus NOT DETECTED NOT DETECTED Final   Rhinovirus  / Enterovirus NOT DETECTED NOT DETECTED Final   Influenza A NOT DETECTED NOT DETECTED Final   Influenza B NOT DETECTED NOT DETECTED Final   Parainfluenza Virus 1 NOT DETECTED NOT DETECTED Final   Parainfluenza Virus 2 NOT DETECTED NOT DETECTED Final   Parainfluenza Virus 3 NOT DETECTED NOT DETECTED Final   Parainfluenza Virus 4 NOT DETECTED NOT DETECTED Final   Respiratory Syncytial Virus NOT DETECTED NOT DETECTED Final   Bordetella pertussis NOT DETECTED NOT DETECTED Final   Bordetella Parapertussis NOT DETECTED NOT DETECTED Final   Chlamydophila pneumoniae NOT DETECTED NOT DETECTED Final   Mycoplasma pneumoniae NOT DETECTED NOT DETECTED Final    Comment: Performed at [facility], [address], [place], [place] [address]  Expectorated Sputum Assessment w Gram Stain, Rflx to Resp Cult     Status: None   Collection Time: 12/21/22  1:13 PM   Specimen: Sputum  Result Value Ref Range Status   Specimen Description SPUTUM  Final   Special Requests NONE  Final   Sputum evaluation   Final    THIS SPECIMEN IS ACCEPTABLE FOR SPUTUM CULTURE Performed at [facility], [address], [place], [place] [address]  Report Status 12/21/2022 FINAL  Final    Antimicrobials: Anti-infectives (From admission, onward)    Start     Dose/Rate Route Frequency Ordered Stop   12/21/22 0930  azithromycin (ZITHROMAX) 500 mg in sodium chloride 0.9 % 250 mL IVPB        500 mg 250 mL/hr over 60 Minutes Intravenous Every 24 hours 12/21/22 0845 12/24/22 0944   12/19/22 1200  ceFEPIme (MAXIPIME) 2 g in sodium chloride 0.9 % 100 mL IVPB        2 g 200 mL/hr over 30 Minutes Intravenous Every 8 hours 12/19/22 1051     12/19/22 0800  vancomycin (VANCOCIN) IVPB 1000 mg/200 mL premix  Status:  Discontinued        1,000 mg 100 mL/hr over 120 Minutes Intravenous Every 24 hours 12/18/22 1903 12/19/22 0842   12/19/22 0600  vancomycin (VANCOCIN) IVPB 1000 mg/200 mL premix  Status:  Discontinued         1,000 mg 200 mL/hr over 60 Minutes Intravenous Every 24 hours 12/18/22 0535 12/18/22 1903   12/18/22 1600  ceFEPIme (MAXIPIME) 2 g in sodium chloride 0.9 % 100 mL IVPB  Status:  Discontinued        2 g 200 mL/hr over 30 Minutes Intravenous Every 12 hours 12/18/22 0535 12/19/22 1051   12/18/22 0515  vancomycin (VANCOCIN) IVPB 1000 mg/200 mL premix        1,000 mg 200 mL/hr over 60 Minutes Intravenous  Once 12/18/22 0505 12/18/22 0645   12/18/22 0515  ceFEPIme (MAXIPIME) 2 g in sodium chloride 0.9 % 100 mL IVPB        2 g 200 mL/hr over 30 Minutes Intravenous  Once 12/18/22 0505 12/18/22 0601      Culture/Microbiology    Component Value Date/Time   SDES SPUTUM 12/21/2022 1313   SPECREQUEST NONE 12/21/2022 1313   CULT  12/18/2022 0442    NO GROWTH 4 DAYS Performed at [facility], [address] [place], [place] [address]    CULT  12/18/2022 [phone]    NO GROWTH 4 DAYS Performed at [facility], [address], [place], [place] [address]    REPTSTATUS 12/21/2022 FINAL 12/21/2022 1313   Other culture-see note  Radiology Studies: No results found.   LOS: 4 days   Total time spent in review of labs and imaging, patient evaluation, formulation of plan, documentation and communication with family:  35  minutes  Lanae Boast, MD [facility]  12/22/2022, 1:02 PM

## 2022-12-22 NOTE — Progress Notes (Signed)
   12/22/22 2232  BiPAP/CPAP/SIPAP  BiPAP/CPAP/SIPAP Pt Type Adult  Reason BIPAP/CPAP not in use Non-compliant (pt stated she wanted to hold off on cpap for the night)

## 2022-12-23 ENCOUNTER — Other Ambulatory Visit (HOSPITAL_COMMUNITY): Payer: Self-pay

## 2022-12-23 DIAGNOSIS — J9601 Acute respiratory failure with hypoxia: Secondary | ICD-10-CM | POA: Diagnosis not present

## 2022-12-23 LAB — CBC
HCT: 34.2 % — ABNORMAL LOW (ref 36.0–46.0)
Hemoglobin: 11 g/dL — ABNORMAL LOW (ref 12.0–15.0)
MCH: 29.8 pg (ref 26.0–34.0)
MCHC: 32.2 g/dL (ref 30.0–36.0)
MCV: 92.7 fL (ref 80.0–100.0)
Platelets: 261 10*3/uL (ref 150–400)
RBC: 3.69 MIL/uL — ABNORMAL LOW (ref 3.87–5.11)
RDW: 13.8 % (ref 11.5–15.5)
WBC: 6.3 10*3/uL (ref 4.0–10.5)
nRBC: 0 % (ref 0.0–0.2)

## 2022-12-23 LAB — BASIC METABOLIC PANEL
Anion gap: 8 (ref 5–15)
BUN: 20 mg/dL (ref 6–20)
CO2: 27 mmol/L (ref 22–32)
Calcium: 8.6 mg/dL — ABNORMAL LOW (ref 8.9–10.3)
Chloride: 101 mmol/L (ref 98–111)
Creatinine, Ser: 1.17 mg/dL — ABNORMAL HIGH (ref 0.44–1.00)
GFR, Estimated: >60 mL/min (ref >60)
Glucose, Bld: 180 mg/dL — ABNORMAL HIGH (ref 70–99)
Potassium: 4 mmol/L (ref 3.5–5.1)
Sodium: 136 mmol/L (ref 135–145)

## 2022-12-23 LAB — CULTURE, BLOOD (ROUTINE X 2)
Culture: NO GROWTH
Culture: NO GROWTH
Special Requests: ADEQUATE
Special Requests: ADEQUATE

## 2022-12-23 LAB — GLUCOSE, CAPILLARY
Glucose-Capillary: 148 mg/dL — ABNORMAL HIGH (ref 70–99)
Glucose-Capillary: 149 mg/dL — ABNORMAL HIGH (ref 70–99)
Glucose-Capillary: 203 mg/dL — ABNORMAL HIGH (ref 70–99)

## 2022-12-23 MED ORDER — CEFDINIR 300 MG PO CAPS
300.0000 mg | ORAL_CAPSULE | Freq: Two times a day (BID) | ORAL | 0 refills | Status: AC
Start: 2022-12-23 — End: 2022-12-25
  Filled 2022-12-23: qty 4, 2d supply, fill #0

## 2022-12-23 NOTE — Progress Notes (Signed)
Pharmacy Antibiotic Note  Tracey Perkins is a 37 y.o. female admitted on 12/18/2022 with pneumonia.  Pharmacy has been consulted for Cefepime dosing.  Antimicrobials this admission:  12/1 Cefepime >> 12/1 Vanc >>12/2 12/4 Azithro x 3d>> 12/6  Dose adjustments this admission:  12/1: Vanc 1g q24h (eAUC 541, SCr 1.4) 12/2: Same dose calculated today (AUC 481, Scr 1.23) 12/4: Same dose calculated today(AUC 513, Scr 1.32)  Microbiology results:  12/1 BCx: Neg 12/1 Resp panel: COVD -, Influenza A/B -, RSV -  12/1 MRSA PCR: neg  Plan: Con't Cefepime 2g IV q8hr #6. Pharmacy will sign off. Please reconsult for further dosing assitance.    Height: 5\' 3"  (160 cm) Weight: 86.9 kg (191 lb 9.3 oz) IBW/kg (Calculated) : 52.4  Temp (24hrs), Avg:98.9 F (37.2 C), Min:98.9 F (37.2 C), Max:98.9 F (37.2 C)  Recent Labs  Lab 12/18/22 0607 12/19/22 0446 12/20/22 0458 12/21/22 0514 12/21/22 0640 12/22/22 0543 12/23/22 0518  WBC  --  5.8 5.3  --  6.0 5.9 6.3  CREATININE  --  1.23* 1.25* 1.32*  --  1.15* 1.17*  LATICACIDVEN 0.3*  --   --   --   --   --   --     Estimated Creatinine Clearance: 69.5 mL/min (A) (by C-G formula based on SCr of 1.17 mg/dL (H)).    Allergies  Allergen Reactions   Banana Swelling and Other (See Comments)    Mouth burning and swollen   Bactrim [Sulfamethoxazole-Trimethoprim] Hives   Doxycycline Hives   Gentian Violet Other (See Comments)    Mouth burns   Gentian Violet-Proflavine Sulfate [Triple Dye] Other (See Comments)    Mouth burns   Mold Extract [Trichophyton] Other (See Comments)    Hay Fever   Morphine And Codeine Hives   Other Hives, Itching and Other (See Comments)    "Hay fever, dust and pollen."  Per patient.- itching, runny nose   Sulfa Antibiotics    Vancomycin Other (See Comments)    Red man syndrome     Beautiful Tracey Perkins, PharmD, BCPS Clinical Staff Pharmacist [email or web address] Pasty Spillers 12/23/2022 9:25 AM

## 2022-12-23 NOTE — Progress Notes (Signed)
AVS reviewed w/ pt & mother who verbalized an understanding. PIV removed x 2  by this R. Pt dressed for d/c to home. No other questions at this time. Rolling walker not covered by insurance per mom & TOC. Pt to lobby via w/c

## 2022-12-23 NOTE — Progress Notes (Signed)
Physical Therapy Treatment Patient Details Name: Tracey Perkins MRN: [id number] DOB: [birth year] Today's Date: 12/23/2022   History of Present Illness Tracey Perkins is a 37 y.o. female admitted with Acute hypoxic respiratory failure and Multifocal pneumonia.  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 Pt able to ascend/descend steps with single handrail, practicing a variety of handholds due to ability to use R or L handrail at a time to enter the home and for split level. Pt with initial anxiousness, CGA for safety, improved confidence with first successful stair completion, improved to supv with following reps. Pt's mom present, all questions and education complete.   If plan is discharge home, recommend the following: A little help with walking and/or transfers;A little help with bathing/dressing/bathroom;Help with stairs or ramp for entrance;Assist for transportation;Assistance with cooking/housework   Can travel by private Automotive engineer (2 wheels)    Recommendations for Other Services       Precautions / Restrictions Precautions Precautions: Fall Restrictions Weight Bearing Restrictions: No     Mobility  Bed Mobility Overal bed mobility: Independent                  Transfers Overall transfer level: Modified independent Equipment used: Rolling walker (2 wheels) Transfers: Sit to/from Stand Sit to Stand: Supervision, Modified independent (Device/Increase time)           General transfer comment: supv-modified ind with STS transfers from EOB and toilet     Ambulation/Gait Ambulation/Gait assistance: Contact guard assist Gait Distance (Feet): 40 Feet Assistive device: Rolling walker (2 wheels) Gait Pattern/deviations: Step-through pattern, Decreased stride length, Wide base of support Gait velocity: decreased     General Gait Details: pt with step through gait pattern, increased lateral weight shifting and slightly unsteady without overt LOB, equal bil step length, denies SOB or dizziness   Stairs Stairs: Yes Stairs assistance: Contact guard assist, Supervision Stair Management: Forwards Number of Stairs: 8 General stair comments: pt ascends/descend 2 steps with single R handrail to simulate home environment with good steadiness, verbalizes anxiousness, CGA for safety; pt ascends/descends 2 steps with L handrail, improved confidence, supv; pt ascends descends 4 steps with bil hands on single R handrail, supv for safety   Wheelchair Mobility     Tilt Bed    Modified Rankin (Stroke Patients Only)       Balance Overall balance assessment: Mild deficits observed, not formally tested                                          Cognition Arousal: Alert Behavior During Therapy: WFL for tasks assessed/performed Overall Cognitive Status: History of cognitive impairments - at baseline                                          Exercises      General Comments        Pertinent Vitals/Pain Pain Assessment Pain Assessment: No/denies pain  Home Living Family/patient expects to be discharged to:: Private residence Living Arrangements: Parent Available Help at Discharge: Family;Available 24 hours/day Type of Home: House Home Access: Stairs to enter Entrance Stairs-Rails: Doctor, general practice of Steps: 5   Home Layout:  (split level) Home Equipment: Rollator (4 wheels);Grab bars - tub/shower      Prior Function            PT Goals (current goals can now be found in the  care plan section) Acute Rehab PT Goals Patient Stated Goal: return home PT Goal Formulation: With patient/family Time For Goal Achievement: 01/06/23 Potential to Achieve Goals: Good Progress towards PT goals: Progressing toward goals    Frequency    Min 1X/week      PT Plan      Co-evaluation              AM-PAC PT "6 Clicks" Mobility   Outcome Measure  Help needed turning from your back to your side while in a flat bed without using bedrails?: None Help needed moving from lying on your back to sitting on the side of a flat bed without using bedrails?: None Help needed moving to and from a bed to a chair (including a wheelchair)?: A Little Help needed standing up from a chair using your arms (e.g., wheelchair or bedside chair)?: None Help needed to walk in hospital room?: A Little Help needed climbing 3-5 steps with a railing? : A Little 6 Click Score: 21    End of Session Equipment Utilized During Treatment: Gait belt Activity Tolerance: Patient tolerated treatment well Patient left: in bed;with call bell/phone within reach;with bed alarm set Nurse Communication: Mobility status PT Visit Diagnosis: Other symptoms and signs involving the nervous system (R29.898);Other abnormalities of gait and mobility (R26.89)     Time: [id number] PT Time Calculation (min) (ACUTE ONLY): 14 min  Charges:    $Gait Training: 8-22 mins PT General Charges $$ ACUTE PT VISIT: 1 Visit                     Tori Ashleigh Luckow PT, DPT 12/23/22, 2:28 PM

## 2022-12-23 NOTE — Plan of Care (Signed)
  Problem: Education: Goal: Ability to describe self-care measures that may prevent or decrease complications (Diabetes Survival Skills Education) will improve Outcome: Completed/Met Goal: Individualized Educational Video(s) Outcome: Completed/Met   Problem: Coping: Goal: Ability to adjust to condition or change in health will improve Outcome: Completed/Met   Problem: Fluid Volume: Goal: Ability to maintain a balanced intake and output will improve Outcome: Completed/Met   Problem: Health Behavior/Discharge Planning: Goal: Ability to identify and utilize available resources and services will improve Outcome: Completed/Met Goal: Ability to manage health-related needs will improve Outcome: Completed/Met   Problem: Metabolic: Goal: Ability to maintain appropriate glucose levels will improve Outcome: Completed/Met   Problem: Nutritional: Goal: Maintenance of adequate nutrition will improve Outcome: Completed/Met Goal: Progress toward achieving an optimal weight will improve Outcome: Completed/Met   Problem: Skin Integrity: Goal: Risk for impaired skin integrity will decrease Outcome: Completed/Met   Problem: Tissue Perfusion: Goal: Adequacy of tissue perfusion will improve Outcome: Completed/Met   Problem: Education: Goal: Knowledge of General Education information will improve Description: Including pain rating scale, medication(s)/side effects and non-pharmacologic comfort measures Outcome: Completed/Met   Problem: Health Behavior/Discharge Planning: Goal: Ability to manage health-related needs will improve Outcome: Completed/Met   Problem: Clinical Measurements: Goal: Ability to maintain clinical measurements within normal limits will improve Outcome: Completed/Met Goal: Will remain free from infection Outcome: Completed/Met Goal: Diagnostic test results will improve Outcome: Completed/Met Goal: Respiratory complications will improve Outcome: Completed/Met Goal:  Cardiovascular complication will be avoided Outcome: Completed/Met   Problem: Activity: Goal: Risk for activity intolerance will decrease Outcome: Completed/Met   Problem: Nutrition: Goal: Adequate nutrition will be maintained Outcome: Completed/Met   Problem: Coping: Goal: Level of anxiety will decrease Outcome: Completed/Met   Problem: Elimination: Goal: Will not experience complications related to bowel motility Outcome: Completed/Met Goal: Will not experience complications related to urinary retention Outcome: Completed/Met   Problem: Pain Management: Goal: General experience of comfort will improve Outcome: Completed/Met   Problem: Safety: Goal: Ability to remain free from injury will improve Outcome: Completed/Met   Problem: Skin Integrity: Goal: Risk for impaired skin integrity will decrease Outcome: Completed/Met

## 2022-12-23 NOTE — Discharge Summary (Signed)
Physician Discharge Summary  Tracey Perkins YNW:[id number] DOB: [birth year] DOA: 12/18/2022  PCP: Chilton Greathouse, MD  Admit date: 12/18/2022 Discharge date: 12/23/2022 30 Day Unplanned Readmission Risk Score    Flowsheet Row ED to Hosp-Admission (Current) from 12/18/2022 in [facility]  30 Day Unplanned Readmission Risk Score (%) 16.24 Filed at 12/23/2022 0801       This score is the patient's risk of an unplanned readmission within 30 days of being discharged (0 -100%). The score is based on dignosis, age, lab data, medications, orders, and past utilization.   Low:  0-14.9   Medium: 15-21.9   High: 22-29.9   Extreme: 30 and above          Admitted From: Home Disposition: Home  Recommendations for Outpatient Follow-up:  Follow up with PCP in 1-2 weeks Please obtain BMP/CBC in one week  Chest CT follow-up recommended in 3 months.  Please follow up with your PCP on the following pending results: Unresulted Labs (From admission, onward)     Start     Ordered   12/20/22 0500  Basic metabolic panel  Daily,   R      12/19/22 1055   12/20/22 0500  CBC  Daily,   R      12/19/22 1055              Home Health: None Equipment/Devices: None  Discharge Condition: Stable CODE STATUS: Full code Diet recommendation: Cardiac  Subjective: Seen and examined.  Mother at the bedside.  Patient feeling much better with no complaints, no shortness of breath.  Mother in agreement with discharge today.  Brief/Interim Summary: 37 y.o. female with medical history significant of asthma, brain tumor as a child medulloblastoma, history of TBI as an adult, history of CVA, mild cognitive impairment, postconcussion syndrome, vision changes, type 1 diabetes, diabetic gastroparesis, intractable nausea and vomiting, diabetic peripheral neuropathy, eczema, seasonal allergies, food allergy,hypothyroidism, OSA on CPAP who had been having cough for the past 2 weeks  associated with wheezing, progressively worse dyspnea and productive cough of greenish sputum.  She was taken to the urgent care on Thursday started on Augmentin, developed a fever  on saturday and PCP was called, a Zithromax was added to her regimen. 12/1 her mother woke up, she went to check on her and found her to be febrile with a fever of 104 F and her O2 sat was 86%.  She was not wearing her CPAP.   Upon arrival to ED, she was febrile 102.9 tachycardic BP stable 90% on room air.  Given supplemental oxygen, CTA chest no evidence of PE.  Bilateral upper lobe and right middle lobe infiltrates, likely due to pneumonia.  Mild bilateral hilar and mediastinal lymphadenopathy, nonspecific but likely reactive in etiology.  Chest CT follow-up recommended in 3 months.  Patient received supplemental oxygen, normal saline 1000 mL bolus, ibuprofen 600 mg p.o. x 1, acetaminophen 1000 mg p.o., was started on cefepime and vancomycin.  She was admitted under hospitalist service with acute hypoxic respiratory failure secondary to multifocal pneumonia and moderate intermittent asthma with acute exacerbation.  Subsequently vancomycin was stopped due to possible "red man" syndrome, having intermittent fever respiratory virus panel obtained negative, blood culture negative, azithromycin added on cefepime.  Patient's last temperature spike was 1-1.2 at 5 PM on 12/21/2022.  She is afebrile and comfortable on room air.  Stable for discharge.  She has received 5 days of cefepime however since she remained  febrile despite of being on broad-spectrum cefepime, we will extend her antibiotic therapy to 7 days and since she failed Augmentin/Zithromax so we will discharge her on cefdinir at this time.   Type 1 diabetes mellitus on insulin pump: Diabetic neuropathy: Resume insulin pump.  Mild AKI: Creatinine fluctuating 1-1.3. b/l 1.0 on admission 1.4 and improved. Encourage oral hydration  GERD: On PPI   Chronic lymphocytic  thyroiditis with hypothyroidism Continue synthroid.   Hyperlipidemia:  Continue statin   Mild cognitive impairment with history of multiple CNS insults: Supportive care.  Mother is a caregiver at bedside and is involved.   Obesity w/ [id number] Body mass index is 33.94 kg/m. : Will benefit with PCP follow-up, weight loss  healthy lifestyle and cont cpap  Discharge plan was discussed with patient and/or family member and they verbalized understanding and agreed with it.  Discharge Diagnoses:  Principal Problem:   Acute respiratory failure with hypoxia (HCC) Active Problems:   Type 1 diabetes (HCC)   Gastroesophageal reflux disease   Chronic lymphocytic thyroiditis   Mild intermittent asthma with acute exacerbation   Anxiety   Mild episode of recurrent major depressive disorder (HCC)   Mild cognitive impairment   Diabetic neuropathy (HCC)   Hypothyroidism   Insulin pump status   Class 1 obesity   Multifocal pneumonia   Hyperlipidemia    Discharge Instructions   Allergies as of 12/23/2022       Reactions   Banana Swelling, Other (See Comments)   Mouth burning and swollen   Bactrim [sulfamethoxazole-trimethoprim] Hives   Doxycycline Hives   Gentian Violet Other (See Comments)   Mouth burns   Gentian Violet-proflavine Sulfate [triple Dye] Other (See Comments)   Mouth burns   Mold Extract [trichophyton] Other (See Comments)   Hay Fever   Morphine And Codeine Hives   Other Hives, Itching, Other (See Comments)   "Hay fever, dust and pollen."  Per patient.- itching, runny nose   Sulfa Antibiotics    Vancomycin Other (See Comments)   Red man syndrome         Medication List     STOP taking these medications    amoxicillin-clavulanate 875-125 MG tablet Commonly known as: AUGMENTIN   azithromycin 250 MG tablet Commonly known as: ZITHROMAX       TAKE these medications    acetaminophen 500 MG tablet Commonly known as: TYLENOL Take 500 mg by mouth as  needed for mild pain or headache.   aspirin EC 81 MG tablet Take 81 mg by mouth daily. Swallow whole.   cefdinir 300 MG capsule Commonly known as: OMNICEF Take 1 capsule (300 mg total) by mouth 2 (two) times daily for 2 days.   citalopram 10 MG tablet Commonly known as: CELEXA Take 0.5 tablets (5 mg total) by mouth at bedtime.   DermOtic 0.01 % Oil Generic drug: Fluocinolone Acetonide Place 4 drops into both ears See admin instructions. Place 4 drops into both ears 2 times a week as needed/as directed   Dexcom G6 Sensor Misc Apply 1 Device topically See admin instructions. Place 1 sensor into the skin every 10 days Dexcom 7   Dexcom G6 Transmitter Misc Dexcom 7   dicyclomine 20 MG tablet Commonly known as: BENTYL Take 1 tablet (20 mg total) by mouth 2 (two) times daily as needed for up to 7 days for spasms.   EPINEPHrine 0.3 mg/0.3 mL Soaj injection Commonly known as: EPI-PEN Use as directed   Florastor 250 MG capsule Generic drug:  saccharomyces boulardii Take 250 mg by mouth in the morning.   fluticasone 110 MCG/ACT inhaler Commonly known as: Flovent HFA Inhale 2 puffs into the lungs 2 (two) times daily as needed (for asthma flares, until cough and wheeze-free). What changed: when to take this   fluticasone 50 MCG/ACT nasal spray Commonly known as: FLONASE 2 sprays per nostril daily as needed for stuffy nose.   gabapentin 300 MG capsule Commonly known as: NEURONTIN Take 300-600 mg by mouth daily. 300 MG in AM and 600 MG in PM   Glucagon 3 MG/DOSE Powd Place 3 mg into the nose as needed (low blood sugar).   insulin aspart 100 UNIT/ML injection Commonly known as: novoLOG Inject into the skin See admin instructions. Up to 190 units a day per pump   Lantus SoloStar 100 UNIT/ML Solostar Pen Generic drug: insulin glargine as needed (uses onlyl if her insulin pump fails).   levothyroxine 75 MCG tablet Commonly known as: SYNTHROID Take 75 mcg by mouth daily  before breakfast.   loperamide 2 MG capsule Commonly known as: IMODIUM Take 1 capsule (2 mg total) by mouth 4 (four) times daily as needed for diarrhea or loose stools.   loratadine 10 MG tablet Commonly known as: CLARITIN Take 1 tablet (10 mg total) by mouth daily as needed for allergies.   lubiprostone 8 MCG capsule Commonly known as: AMITIZA Take 16 mcg by mouth daily with breakfast.   magnesium oxide 400 (240 Mg) MG tablet Commonly known as: MAG-OX Take 800 mg by mouth at bedtime.   meclizine 25 MG tablet Commonly known as: ANTIVERT as needed.   montelukast 10 MG tablet Commonly known as: SINGULAIR 1 tablet by mouth at bedtime to prevent coughing or wheezing.   Olopatadine HCl 0.6 % Soln Place 2 sprays into both nostrils 2 (two) times daily. What changed:  when to take this reasons to take this   ondansetron 4 MG tablet Commonly known as: ZOFRAN Take 1 tablet (4 mg total) by mouth every 4 (four) hours as needed for nausea or vomiting.   pantoprazole 20 MG tablet Commonly known as: Protonix Take 1 tablet (20 mg total) by mouth daily. What changed:  when to take this reasons to take this   potassium chloride 10 MEQ tablet Commonly known as: KLOR-CON Take 10 mEq by mouth daily.   pravastatin 20 MG tablet Commonly known as: PRAVACHOL Take 20 mg by mouth daily after supper.   ProAir HFA 108 (90 Base) MCG/ACT inhaler Generic drug: albuterol Inhale 2 puffs into the lungs every 4 (four) hours as needed for wheezing or shortness of breath. TAKE 2 PUFFS BY MOUTH EVERY 4 HOURS AS NEEDED Strength: 108 (90 Base) MCG/ACT What changed: additional instructions   Ventolin HFA 108 (90 Base) MCG/ACT inhaler Generic drug: albuterol Inhale 2 puffs into the lungs every 4 (four) hours as needed for wheezing or shortness of breath. What changed: Another medication with the same name was changed. Make sure you understand how and when to take each.   SYSTANE OP Place 1 drop  into both eyes 3 (three) times daily as needed (for dryness).        Follow-up Information     Avva, Ravisankar, MD Follow up in 1 week(s).   Specialty: Internal Medicine Contact information: [address] [place] [address] [phone]                Allergies  Allergen Reactions   Banana Swelling and Other (See Comments)  Mouth burning and swollen   Bactrim [Sulfamethoxazole-Trimethoprim] Hives   Doxycycline Hives   Gentian Violet Other (See Comments)    Mouth burns   Gentian Violet-Proflavine Sulfate [Triple Dye] Other (See Comments)    Mouth burns   Mold Extract [Trichophyton] Other (See Comments)    Hay Fever   Morphine And Codeine Hives   Other Hives, Itching and Other (See Comments)    "Hay fever, dust and pollen."  Per patient.- itching, runny nose   Sulfa Antibiotics    Vancomycin Other (See Comments)    Red man syndrome     Consultations: None   Procedures/Studies: CT Angio Chest PE W and/or Wo Contrast  Result Date: 12/18/2022 CLINICAL DATA:  Cough. Hypoxia. Intermittent fever. Suspected pulmonary embolism. EXAM: CT ANGIOGRAPHY CHEST WITH CONTRAST TECHNIQUE: Multidetector CT imaging of the chest was performed using the standard protocol during bolus administration of intravenous contrast. Multiplanar CT image reconstructions and MIPs were obtained to evaluate the vascular anatomy. RADIATION DOSE REDUCTION: This exam was performed according to the departmental dose-optimization program which includes automated exposure control, adjustment of the mA and/or kV according to patient size and/or use of iterative reconstruction technique. CONTRAST:  75mL OMNIPAQUE IOHEXOL 350 MG/ML SOLN COMPARISON:  None Available. FINDINGS: Cardiovascular: Satisfactory opacification of pulmonary arteries noted, and no pulmonary emboli identified. No evidence of thoracic aortic dissection or aneurysm. Mediastinum/Nodes: Mild bilateral hilar and mediastinal lymphadenopathy  is noted, which is nonspecific and may be reactive in etiology. Lungs/Pleura: Pulmonary airspace disease with air bronchograms noted in the anterior left upper lobe. Mild infiltrates also seen in the central right upper and middle lobes. Dependent atelectasis also seen in both lower lobes. No pleural effusion. Upper abdomen: No acute findings. Musculoskeletal: No suspicious bone lesions identified. Review of the MIP images confirms the above findings. IMPRESSION: No evidence of pulmonary embolism. Bilateral upper lobe and right middle lobe infiltrates, likely due to pneumonia. Mild bilateral hilar and mediastinal lymphadenopathy, nonspecific but likely reactive in etiology. Recommend follow-up by chest CT in 3 months. Electronically Signed   By: Danae Orleans M.D.   On: 12/18/2022 09:37   DG Chest Port 1 View  Result Date: 12/18/2022 CLINICAL DATA:  Questionable sepsis EXAM: PORTABLE CHEST 1 VIEW COMPARISON:  12/12/2021 FINDINGS: Low volume chest. There is no edema, consolidation, effusion, or pneumothorax. Normal heart size and mediastinal contours. Artifact from EKG leads. IMPRESSION: Low volume chest without pneumonia. Electronically Signed   By: Tiburcio Pea M.D.   On: 12/18/2022 05:23     Discharge Exam: Vitals:   12/22/22 2148 12/23/22 0506  BP: (!) 142/90 (!) 152/87  Pulse: 91 98  Resp: 18 20  Temp: 98.9 F (37.2 C) 98.9 F (37.2 C)  SpO2: 91% (!) 88%   Vitals:   12/22/22 0930 12/22/22 1300 12/22/22 2148 12/23/22 0506  BP: (!) 142/98 123/86 (!) 142/90 (!) 152/87  Pulse:  93 91 98  Resp:  18 18 20   Temp:  98.9 F (37.2 C) 98.9 F (37.2 C) 98.9 F (37.2 C)  TempSrc:  Oral Oral   SpO2:   91% (!) 88%  Weight:      Height:        General: Pt is alert, awake, not in acute distress Cardiovascular: RRR, S1/S2 +, no rubs, no gallops Respiratory: CTA bilaterally, no wheezing, no rhonchi Abdominal: Soft, NT, ND, bowel sounds + Extremities: no edema, no cyanosis    The results  of significant diagnostics from this hospitalization (including imaging, microbiology, ancillary  and laboratory) are listed below for reference.     Microbiology: Recent Results (from the past 240 hour(s))  Blood Culture (routine x 2)     Status: None   Collection Time: 12/18/22  4:42 AM   Specimen: BLOOD  Result Value Ref Range Status   Specimen Description   Final    BLOOD RIGHT ANTECUBITAL Performed at [facility], [address], [place], [place] [address]    Special Requests   Final    Blood Culture adequate volume BOTTLES DRAWN AEROBIC AND ANAEROBIC Performed at [facility], [address], [place], [place] [address]    Culture   Final    NO GROWTH 5 DAYS Performed at [facility], [address], [place], [place] [address]    Report Status 12/23/2022 FINAL  Final  Blood Culture (routine x 2)     Status: None   Collection Time: 12/18/22  4:42 AM   Specimen: BLOOD  Result Value Ref Range Status   Specimen Description   Final    BLOOD LEFT ANTECUBITAL Performed at [facility], [address], [place], [place] [address]    Special Requests   Final    Blood Culture adequate volume BOTTLES DRAWN AEROBIC AND ANAEROBIC Performed at [facility], [address], [place], [place] [address]    Culture   Final    NO GROWTH 5 DAYS Performed at [facility], [address] [place], [place] [address]    Report Status 12/23/2022 FINAL  Final  Resp panel by RT-PCR (RSV, Flu A&B, Covid) Anterior Nasal Swab     Status: None   Collection Time: 12/18/22  4:43 AM   Specimen: Anterior Nasal Swab  Result Value Ref Range Status   SARS Coronavirus 2 by RT PCR NEGATIVE NEGATIVE Final    Comment: (NOTE) SARS-CoV-2 target nucleic acids are NOT DETECTED.  The SARS-CoV-2 RNA is generally detectable in upper respiratory specimens during the acute phase of infection. The lowest concentration of  SARS-CoV-2 viral copies this assay can detect is 138 copies/mL. A negative result does not preclude SARS-Cov-2 infection and should not be used as the sole basis for treatment or other patient management decisions. A negative result may occur with  improper specimen collection/handling, submission of specimen other than nasopharyngeal swab, presence of viral mutation(s) within the areas targeted by this assay, and inadequate number of viral copies(<138 copies/mL). A negative result must be combined with clinical observations, patient history, and epidemiological information. The expected result is Negative.  Fact Sheet for Patients:  BloggerCourse.com  Fact Sheet for Healthcare Providers:  SeriousBroker.it  This test is no t yet approved or cleared by the [place] FDA and  has been authorized for detection and/or diagnosis of SARS-CoV-2 by FDA under an Emergency Use Authorization (EUA). This EUA will remain  in effect (meaning this test can be used) for the duration of the COVID-19 declaration under Section 564(b)(1) of the Act, 21 U.S.C.section 360bbb-3(b)(1), unless the authorization is terminated  or revoked sooner.       Influenza A by PCR NEGATIVE NEGATIVE Final   Influenza B by PCR NEGATIVE NEGATIVE Final    Comment: (NOTE) The Xpert Xpress SARS-CoV-2/FLU/RSV plus assay is intended as an aid in the diagnosis of influenza from Nasopharyngeal swab specimens and should not be used as a sole basis for treatment. Nasal washings and aspirates are unacceptable for Xpert Xpress SARS-CoV-2/FLU/RSV testing.  Fact Sheet for  Patients: BloggerCourse.com  Fact Sheet for Healthcare Providers: SeriousBroker.it  This test is not yet approved or cleared by the [place] FDA and has been authorized for detection and/or diagnosis of SARS-CoV-2 by FDA under an Emergency Use  Authorization (EUA). This EUA will remain in effect (meaning this test can be used) for the duration of the COVID-19 declaration under Section 564(b)(1) of the Act, 21 U.S.C. section 360bbb-3(b)(1), unless the authorization is terminated or revoked.     Resp Syncytial Virus by PCR NEGATIVE NEGATIVE Final    Comment: (NOTE) Fact Sheet for Patients: BloggerCourse.com  Fact Sheet for Healthcare Providers: SeriousBroker.it  This test is not yet approved or cleared by the [place] FDA and has been authorized for detection and/or diagnosis of SARS-CoV-2 by FDA under an Emergency Use Authorization (EUA). This EUA will remain in effect (meaning this test can be used) for the duration of the COVID-19 declaration under Section 564(b)(1) of the Act, 21 U.S.C. section 360bbb-3(b)(1), unless the authorization is terminated or revoked.  Performed at [facility], [address], [place], [place] [address]   MRSA Next Gen by PCR, Nasal     Status: None   Collection Time: 12/18/22  5:40 PM   Specimen: Nasal Mucosa; Nasal Swab  Result Value Ref Range Status   MRSA by PCR Next Gen NOT DETECTED NOT DETECTED Final    Comment: (NOTE) The GeneXpert MRSA Assay (FDA approved for NASAL specimens only), is one component of a comprehensive MRSA colonization surveillance program. It is not intended to diagnose MRSA infection nor to guide or monitor treatment for MRSA infections. Test performance is not FDA approved in patients less than 58 years old. Performed at [facility], [address], [place], [place] [address]   Respiratory (~20 pathogens) panel by PCR     Status: None   Collection Time: 12/21/22  8:45 AM   Specimen: Nasopharyngeal Swab; Respiratory  Result Value Ref Range Status   Adenovirus NOT DETECTED NOT DETECTED Final   Coronavirus 229E NOT DETECTED NOT DETECTED Final    Comment: (NOTE) The  Coronavirus on the Respiratory Panel, DOES NOT test for the novel  Coronavirus (2019 nCoV)    Coronavirus HKU1 NOT DETECTED NOT DETECTED Final   Coronavirus NL63 NOT DETECTED NOT DETECTED Final   Coronavirus OC43 NOT DETECTED NOT DETECTED Final   Metapneumovirus NOT DETECTED NOT DETECTED Final   Rhinovirus / Enterovirus NOT DETECTED NOT DETECTED Final   Influenza A NOT DETECTED NOT DETECTED Final   Influenza B NOT DETECTED NOT DETECTED Final   Parainfluenza Virus 1 NOT DETECTED NOT DETECTED Final   Parainfluenza Virus 2 NOT DETECTED NOT DETECTED Final   Parainfluenza Virus 3 NOT DETECTED NOT DETECTED Final   Parainfluenza Virus 4 NOT DETECTED NOT DETECTED Final   Respiratory Syncytial Virus NOT DETECTED NOT DETECTED Final   Bordetella pertussis NOT DETECTED NOT DETECTED Final   Bordetella Parapertussis NOT DETECTED NOT DETECTED Final   Chlamydophila pneumoniae NOT DETECTED NOT DETECTED Final   Mycoplasma pneumoniae NOT DETECTED NOT DETECTED Final    Comment: Performed at [facility], [address] [place], [place] [address]  Expectorated Sputum Assessment w Gram Stain, Rflx to Resp Cult     Status: None   Collection Time: 12/21/22  1:13 PM   Specimen: Sputum  Result Value Ref Range Status   Specimen Description SPUTUM  Final   Special Requests NONE  Final   Sputum evaluation   Final  THIS SPECIMEN IS ACCEPTABLE FOR SPUTUM CULTURE Performed at [facility], [address] [place], [place] [address]    Report Status 12/21/2022 FINAL  Final  Culture, Respiratory w Gram Stain     Status: None (Preliminary result)   Collection Time: 12/21/22  1:13 PM   Specimen: SPU  Result Value Ref Range Status   Specimen Description   Final    SPUTUM Performed at [facility], [address] [place], [place] [address]    Special Requests   Final    NONE Reflexed from [id number] Performed at [facility], [address]  [place], [place] [address]    Gram Stain   Final    FEW WBC PRESENT,BOTH PMN AND MONONUCLEAR NO ORGANISMS SEEN Performed at [facility], [address], [place], [place] [address]    Culture PENDING  Incomplete   Report Status PENDING  Incomplete     Labs: BNP (last 3 results) No results for input(s): "BNP" in the last 8760 hours. Basic Metabolic Panel: Recent Labs  Lab 12/18/22 0443 12/19/22 0446 12/20/22 0458 12/21/22 0514 12/22/22 0543 12/23/22 0518  NA 134* 136 137 139 138 136  K 3.7 3.9 3.5 4.0 3.8 4.0  CL 97* 105 104 105 103 101  CO2 28 24 24 26 27 27   GLUCOSE 158* 136* 85 106* 156* 180*  BUN 16 15 17 18 16 20   CREATININE 1.40* 1.23* 1.25* 1.32* 1.15* 1.17*  CALCIUM 8.5* 7.5* 7.6* 8.2* 8.6* 8.6*  MG 2.2  --   --   --   --   --   PHOS 2.7  --   --   --   --   --    Liver Function Tests: Recent Labs  Lab 12/18/22 0443 12/19/22 0446  AST 27 56*  ALT 23 41  ALKPHOS 91 85  BILITOT 0.3 0.2  PROT 7.7 6.0*  ALBUMIN 3.9 3.0*   No results for input(s): "LIPASE", "AMYLASE" in the last 168 hours. No results for input(s): "AMMONIA" in the last 168 hours. CBC: Recent Labs  Lab 12/18/22 0443 12/19/22 0446 12/20/22 0458 12/21/22 0640 12/22/22 0543 12/23/22 0518  WBC 7.9 5.8 5.3 6.0 5.9 6.3  NEUTROABS 5.6  --   --   --   --   --   HGB 12.3 9.8* 10.6* 10.4* 11.1* 11.0*  HCT 36.8 29.4* 32.4* 32.8* 34.5* 34.2*  MCV 91.5 92.7 92.3 94.0 93.8 92.7  PLT 278 208 210 223 245 261   Cardiac Enzymes: No results for input(s): "CKTOTAL", "CKMB", "CKMBINDEX", "TROPONINI" in the last 168 hours. BNP: Invalid input(s): "POCBNP" CBG: Recent Labs  Lab 12/22/22 1133 12/22/22 1544 12/22/22 2142 12/23/22 0216 12/23/22 0720  GLUCAP 203* 216* 218* 148* 149*   D-Dimer No results for input(s): "DDIMER" in the last 72 hours. Hgb A1c No results for input(s): "HGBA1C" in the last 72 hours. Lipid Profile No results for input(s): "CHOL", "HDL", "LDLCALC", "TRIG", "CHOLHDL",  "LDLDIRECT" in the last 72 hours. Thyroid function studies No results for input(s): "TSH", "T4TOTAL", "T3FREE", "THYROIDAB" in the last 72 hours.  Invalid input(s): "FREET3" Anemia work up No results for input(s): "VITAMINB12", "FOLATE", "FERRITIN", "TIBC", "IRON", "RETICCTPCT" in the last 72 hours. Urinalysis    Component Value Date/Time   COLORURINE YELLOW 12/18/2022 0443   APPEARANCEUR CLOUDY (A) 12/18/2022 0443   LABSPEC 1.032 (H) 12/18/2022 0443   PHURINE 6.0 12/18/2022 0443   GLUCOSEU 50 (A) 12/18/2022 3086  HGBUR LARGE (A) 12/18/2022 0443   BILIRUBINUR NEGATIVE 12/18/2022 0443   KETONESUR NEGATIVE 12/18/2022 0443   PROTEINUR 100 (A) 12/18/2022 0443   UROBILINOGEN 0.2 07/14/2013 0746   NITRITE NEGATIVE 12/18/2022 0443   LEUKOCYTESUR NEGATIVE 12/18/2022 0443   Sepsis Labs Recent Labs  Lab 12/20/22 0458 12/21/22 0640 12/22/22 0543 12/23/22 0518  WBC 5.3 6.0 5.9 6.3   Microbiology Recent Results (from the past 240 hour(s))  Blood Culture (routine x 2)     Status: None   Collection Time: 12/18/22  4:42 AM   Specimen: BLOOD  Result Value Ref Range Status   Specimen Description   Final    BLOOD RIGHT ANTECUBITAL Performed at [facility], [address], [place], [place] [address]    Special Requests   Final    Blood Culture adequate volume BOTTLES DRAWN AEROBIC AND ANAEROBIC Performed at [facility], [address] [place], [place] [address]    Culture   Final    NO GROWTH 5 DAYS Performed at [facility], [address], [place], [place] [address]    Report Status 12/23/2022 FINAL  Final  Blood Culture (routine x 2)     Status: None   Collection Time: 12/18/22  4:42 AM   Specimen: BLOOD  Result Value Ref Range Status   Specimen Description   Final    BLOOD LEFT ANTECUBITAL Performed at [facility], [address], [place], [place] [address]    Special Requests   Final    Blood Culture  adequate volume BOTTLES DRAWN AEROBIC AND ANAEROBIC Performed at [facility], [address] [place], [place] [address]    Culture   Final    NO GROWTH 5 DAYS Performed at [facility], [address] [place], [place] [address]    Report Status 12/23/2022 FINAL  Final  Resp panel by RT-PCR (RSV, Flu A&B, Covid) Anterior Nasal Swab     Status: None   Collection Time: 12/18/22  4:43 AM   Specimen: Anterior Nasal Swab  Result Value Ref Range Status   SARS Coronavirus 2 by RT PCR NEGATIVE NEGATIVE Final    Comment: (NOTE) SARS-CoV-2 target nucleic acids are NOT DETECTED.  The SARS-CoV-2 RNA is generally detectable in upper respiratory specimens during the acute phase of infection. The lowest concentration of SARS-CoV-2 viral copies this assay can detect is 138 copies/mL. A negative result does not preclude SARS-Cov-2 infection and should not be used as the sole basis for treatment or other patient management decisions. A negative result may occur with  improper specimen collection/handling, submission of specimen other than nasopharyngeal swab, presence of viral mutation(s) within the areas targeted by this assay, and inadequate number of viral copies(<138 copies/mL). A negative result must be combined with clinical observations, patient history, and epidemiological information. The expected result is Negative.  Fact Sheet for Patients:  BloggerCourse.com  Fact Sheet for Healthcare Providers:  SeriousBroker.it  This test is no t yet approved or cleared by the [place] FDA and  has been authorized for detection and/or diagnosis of SARS-CoV-2 by FDA under an Emergency Use Authorization (EUA). This EUA will remain  in effect (meaning this test can be used) for the duration of the COVID-19 declaration under Section 564(b)(1) of the Act, 21 U.S.C.section 360bbb-3(b)(1), unless the authorization is  terminated  or revoked sooner.       Influenza A by PCR NEGATIVE NEGATIVE Final   Influenza B  by PCR NEGATIVE NEGATIVE Final    Comment: (NOTE) The Xpert Xpress SARS-CoV-2/FLU/RSV plus assay is intended as an aid in the diagnosis of influenza from Nasopharyngeal swab specimens and should not be used as a sole basis for treatment. Nasal washings and aspirates are unacceptable for Xpert Xpress SARS-CoV-2/FLU/RSV testing.  Fact Sheet for Patients: BloggerCourse.com  Fact Sheet for Healthcare Providers: SeriousBroker.it  This test is not yet approved or cleared by the [place] FDA and has been authorized for detection and/or diagnosis of SARS-CoV-2 by FDA under an Emergency Use Authorization (EUA). This EUA will remain in effect (meaning this test can be used) for the duration of the COVID-19 declaration under Section 564(b)(1) of the Act, 21 U.S.C. section 360bbb-3(b)(1), unless the authorization is terminated or revoked.     Resp Syncytial Virus by PCR NEGATIVE NEGATIVE Final    Comment: (NOTE) Fact Sheet for Patients: BloggerCourse.com  Fact Sheet for Healthcare Providers: SeriousBroker.it  This test is not yet approved or cleared by the [place] FDA and has been authorized for detection and/or diagnosis of SARS-CoV-2 by FDA under an Emergency Use Authorization (EUA). This EUA will remain in effect (meaning this test can be used) for the duration of the COVID-19 declaration under Section 564(b)(1) of the Act, 21 U.S.C. section 360bbb-3(b)(1), unless the authorization is terminated or revoked.  Performed at [facility], [address] [place], [place] [address]   MRSA Next Gen by PCR, Nasal     Status: None   Collection Time: 12/18/22  5:40 PM   Specimen: Nasal Mucosa; Nasal Swab  Result Value Ref Range Status   MRSA by PCR Next Gen NOT  DETECTED NOT DETECTED Final    Comment: (NOTE) The GeneXpert MRSA Assay (FDA approved for NASAL specimens only), is one component of a comprehensive MRSA colonization surveillance program. It is not intended to diagnose MRSA infection nor to guide or monitor treatment for MRSA infections. Test performance is not FDA approved in patients less than 77 years old. Performed at [facility], [address] [place], [place] [address]   Respiratory (~20 pathogens) panel by PCR     Status: None   Collection Time: 12/21/22  8:45 AM   Specimen: Nasopharyngeal Swab; Respiratory  Result Value Ref Range Status   Adenovirus NOT DETECTED NOT DETECTED Final   Coronavirus 229E NOT DETECTED NOT DETECTED Final    Comment: (NOTE) The Coronavirus on the Respiratory Panel, DOES NOT test for the novel  Coronavirus (2019 nCoV)    Coronavirus HKU1 NOT DETECTED NOT DETECTED Final   Coronavirus NL63 NOT DETECTED NOT DETECTED Final   Coronavirus OC43 NOT DETECTED NOT DETECTED Final   Metapneumovirus NOT DETECTED NOT DETECTED Final   Rhinovirus / Enterovirus NOT DETECTED NOT DETECTED Final   Influenza A NOT DETECTED NOT DETECTED Final   Influenza B NOT DETECTED NOT DETECTED Final   Parainfluenza Virus 1 NOT DETECTED NOT DETECTED Final   Parainfluenza Virus 2 NOT DETECTED NOT DETECTED Final   Parainfluenza Virus 3 NOT DETECTED NOT DETECTED Final   Parainfluenza Virus 4 NOT DETECTED NOT DETECTED Final   Respiratory Syncytial Virus NOT DETECTED NOT DETECTED Final   Bordetella pertussis NOT DETECTED NOT DETECTED Final   Bordetella Parapertussis NOT DETECTED NOT DETECTED Final   Chlamydophila pneumoniae NOT DETECTED NOT DETECTED Final   Mycoplasma pneumoniae NOT DETECTED NOT DETECTED Final    Comment: Performed at [facility], [address], [place], [place] [address]  Expectorated Sputum Assessment w Gram Stain, Rflx to Resp Cult     Status: None   Collection Time: 12/21/22  1:13 PM    Specimen: Sputum  Result Value Ref Range Status   Specimen Description SPUTUM  Final   Special Requests NONE  Final   Sputum evaluation   Final    THIS SPECIMEN IS ACCEPTABLE FOR SPUTUM CULTURE Performed at [facility], [address], [place], [place] [address]    Report Status 12/21/2022 FINAL  Final  Culture, Respiratory w Gram Stain     Status: None (Preliminary result)   Collection Time: 12/21/22  1:13 PM   Specimen: SPU  Result Value Ref Range Status   Specimen Description   Final    SPUTUM Performed at [facility], [address], [place], [place] [address]    Special Requests   Final    NONE Reflexed from [id number] Performed at [facility], [address] [place], [place] [address]    Gram Stain   Final    FEW WBC PRESENT,BOTH PMN AND MONONUCLEAR NO ORGANISMS SEEN Performed at [facility], [address] [place], [place] [address]    Culture PENDING  Incomplete   Report Status PENDING  Incomplete    FURTHER DISCHARGE INSTRUCTIONS:   Get Medicines reviewed and adjusted: Please take all your medications with you for your next visit with your Primary MD   Laboratory/radiological data: Please request your Primary MD to go over all hospital tests and procedure/radiological results at the follow up, please ask your Primary MD to get all Hospital records sent to his/her office.   In some cases, they will be blood work, cultures and biopsy results pending at the time of your discharge. Please request that your primary care M.D. goes through all the records of your hospital data and follows up on these results.   Also Note the following: If you experience worsening of your admission symptoms, develop shortness of breath, life threatening emergency, suicidal or homicidal thoughts you must seek medical attention immediately by calling 911 or calling your MD immediately  if symptoms less severe.   You must  read complete instructions/literature along with all the possible adverse reactions/side effects for all the Medicines you take and that have been prescribed to you. Take any new Medicines after you have completely understood and accpet all the possible adverse reactions/side effects.    Do not drive when taking Pain medications or sleeping medications (Benzodaizepines)   Do not take more than prescribed Pain, Sleep and Anxiety Medications. It is not advisable to combine anxiety,sleep and pain medications without talking with your primary care practitioner   Special Instructions: If you have smoked or chewed Tobacco  in the last 2 yrs please stop smoking, stop any regular Alcohol  and or any Recreational drug use.   Wear Seat belts while driving.   Please note: You were cared for by a hospitalist during your hospital stay. Once you are discharged, your primary care physician will handle any further medical issues. Please note that NO REFILLS for any discharge medications will be authorized once you are discharged, as it is imperative that you return to your primary care physician (or establish a relationship with a primary care physician if you do not have one) for your post hospital discharge needs so that they can reassess your need for medications and monitor your lab values  Time coordinating discharge: Over 30 minutes  SIGNED:  Hughie Closs, MD  [facility] 12/23/2022, 10:39 AM *Please note that this is a verbal dictation therefore any spelling or grammatical errors are due to the "Dragon Medical One" system interpretation. If 7PM-7AM, please contact night-coverage [email or web address]

## 2022-12-23 NOTE — Evaluation (Signed)
Physical Therapy Evaluation Patient Details Name: Tracey Perkins MRN: [id number] DOB: 01/17/1986 Today's Date: 12/23/2022  History of Present Illness  Tracey Perkins is a 37 y.o. female admitted with Acute hypoxic respiratory failure and Multifocal pneumonia.  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 Pt from home with parents, ind with in home ambulation, uses rollator walker in the community, endorses multiple falls but typically able to get up independently. Mom reports pt sometimes needs assistance out of the bathtub. Pt currently amb with increased lateral weight shifting, slightly unsteady without overt LOB, reports her peripheral neuropathy in her feet feels worse than normal. Pt's mom reports multiple steps to enter the home and concern with negotiating so plan to complete stair training prior to d/c home with family support. Pt currently with functional limitations due to the deficits listed below (see PT Problem List). Pt will benefit from acute skilled PT to increase their independence and safety with mobility to allow discharge.           If plan is discharge home, recommend the following: A little help with walking and/or transfers;A little help with bathing/dressing/bathroom;Help with stairs or ramp for entrance;Assist for transportation;Assistance with cooking/housework   Can travel by private Tax inspector (2 wheels)  Recommendations for Other Services       Functional Status Assessment Patient has had a recent decline in their functional status and  demonstrates the ability to make significant improvements in function in a reasonable and predictable amount of time.     Precautions / Restrictions Precautions Precautions: Fall Restrictions Weight Bearing Restrictions: No      Mobility  Bed Mobility Overal bed mobility: Independent                  Transfers Overall transfer level: Needs assistance Equipment used: Rolling walker (2 wheels) Transfers: Sit to/from Stand Sit to Stand: Supervision, Modified independent (Device/Increase time)           General transfer comment: supv-modified ind with STS transfers from EOB and toilet    Ambulation/Gait Ambulation/Gait assistance: Contact guard assist Gait Distance (Feet): 200 Feet Assistive device: Rolling walker (2 wheels) Gait Pattern/deviations: Step-through pattern, Decreased stride length, Wide base of support Gait velocity: decreased     General Gait Details: pt with step through gait pattern, demonstrates increased lateral weight shifting and slightly unsteady without overt LOB, equal bil step length  Stairs            Wheelchair Mobility     Tilt Bed    Modified Rankin (Stroke Patients Only)       Balance Overall balance assessment: Mild deficits observed, not formally tested                                           Pertinent Vitals/Pain Pain Assessment Pain Assessment: No/denies pain    Home Living Family/patient expects to be discharged to:: Private residence Living Arrangements: Parent Available Help at Discharge: Family;Available 24 hours/day Type of Home: House Home Access: Stairs to enter Entrance Stairs-Rails: Right;Left  Entrance Stairs-Number of Steps: 5   Home Layout:  (split level) Home Equipment: Rollator (4 wheels);Grab bars - tub/shower      Prior Function Prior Level of Function : Independent/Modified Independent;Working/employed             Mobility Comments: pt is ind with in home  ambulation and no AD, uses rollator walker in the community, getting her masters in [employer] from [employer] ADLs Comments: pt ind, "sometimes" needs help out of the bathtub     Extremity/Trunk Assessment   Upper Extremity Assessment Upper Extremity Assessment: Overall WFL for tasks assessed    Lower Extremity Assessment Lower Extremity Assessment: Overall WFL for tasks assessed (AROM WFL, strength grossly 4/5, reports numbness in bil feet)    Cervical / Trunk Assessment Cervical / Trunk Assessment: Normal  Communication   Communication Communication: No apparent difficulties  Cognition Arousal: Alert Behavior During Therapy: WFL for tasks assessed/performed Overall Cognitive Status: History of cognitive impairments - at baseline                                          General Comments      Exercises     Assessment/Plan    PT Assessment Patient needs continued PT services  PT Problem List Decreased activity tolerance;Decreased balance;Impaired sensation       PT Treatment Interventions DME instruction;Gait training;Stair training;Functional mobility training;Therapeutic activities;Therapeutic exercise;Balance training;Neuromuscular re-education;Patient/family education    PT Goals (Current goals can be found in the Care Plan section)  Acute Rehab PT Goals Patient Stated Goal: return home PT Goal Formulation: With patient/family Time For Goal Achievement: 01/06/23 Potential to Achieve Goals: Good    Frequency Min 1X/week     Co-evaluation               AM-PAC PT "6 Clicks" Mobility  Outcome Measure Help needed turning from your back to your side while in a flat bed without using bedrails?: None Help needed moving from lying on your back to sitting on the side of a flat bed without using bedrails?: None Help needed moving to and from a bed to a chair (including a wheelchair)?: A Little Help needed standing up from a chair using your  arms (e.g., wheelchair or bedside chair)?: None Help needed to walk in hospital room?: A Little Help needed climbing 3-5 steps with a railing? : A Little 6 Click Score: 21    End of Session Equipment Utilized During Treatment: Gait belt Activity Tolerance: Patient tolerated treatment well Patient left: in chair;with call bell/phone within reach;with family/visitor present Nurse Communication: Mobility status PT Visit Diagnosis: Other symptoms and signs involving the nervous system (R29.898);Other abnormalities of gait and mobility (R26.89)    Time: 4098-1191 PT Time Calculation (min) (ACUTE ONLY): 29 min   Charges:   PT Evaluation $PT Eval Moderate Complexity: 1 Mod PT Treatments $Gait Training: 8-22 mins PT General Charges $$ ACUTE PT VISIT: 1 Visit         Tori Jeanpaul Biehl PT, DPT 12/23/22, 1:48 PM

## 2022-12-23 NOTE — TOC Transition Note (Addendum)
Transition of Care [facility]) - CM/SW Discharge Note   Patient Details  Name: Tracey Perkins MRN: [id number] Date of Birth: January 03, 1986  [facility] CM/SW Contact:  Lanier Clam, RN Phone Number: 12/23/2022, 11:00 AM   Clinical Narrative: d/c home.   -2:50p-spoke to patient/mother about current rollator vs ordering a rw. Patient has a rollator ordered less than 8yrs;per insurance unable to bill;she can purchase rw out of pocket. Also states they need the rollator for outside use. They will decide on their own if they will purchase. No further CM needs.   Final next level of care: Home/Self Care Barriers to Discharge: No Barriers Identified   Patient Goals and CMS Choice CMS Medicare.gov Compare Post Acute Care list provided to:: Patient Choice offered to / list presented to : Patient  Discharge Placement                         Discharge Plan and Services Additional resources added to the After Visit Summary for     Discharge Planning Services: CM Consult Post Acute Care Choice: Resumption of Svcs/PTA Provider                               Social Determinants of Health (SDOH) Interventions SDOH Screenings   Food Insecurity: No Food Insecurity (12/18/2022)  Housing: Low Risk  (12/18/2022)  Transportation Needs: No Transportation Needs (12/18/2022)  Utilities: Not At Risk (12/18/2022)  Depression (PHQ2-9): Low Risk  (09/02/2021)  Financial Resource Strain: Low Risk  (09/08/2022)   Received from [facility]  Tobacco Use: Low Risk  (12/18/2022)     Readmission Risk Interventions    12/19/2022    3:35 PM  Readmission Risk Prevention Plan  Transportation Screening Complete  PCP or Specialist Appt within 3-5 Days Complete  Social Work Consult for Recovery Care Planning/Counseling Complete  Palliative Care Screening Not Applicable  Medication Review Oceanographer) Complete

## 2022-12-25 LAB — CULTURE, RESPIRATORY W GRAM STAIN

## 2022-12-29 ENCOUNTER — Ambulatory Visit: Payer: Medicaid Other | Admitting: Adult Health

## 2022-12-29 VITALS — BP 126/78 | HR 72 | Ht 63.0 in | Wt 192.0 lb

## 2022-12-29 DIAGNOSIS — G4733 Obstructive sleep apnea (adult) (pediatric): Secondary | ICD-10-CM | POA: Diagnosis not present

## 2022-12-29 NOTE — Progress Notes (Signed)
PATIENT: Tracey Perkins DOB: Jun 21, 1985  REASON FOR VISIT: follow up HISTORY FROM: patient PRIMARY NEUROLOGIST: Dr. Frances Furbish  Chief Complaint  Patient presents with   Follow-up    [name], [name]  Pt is here for initial CPAP. Pt states she was hospitalized on 12/1 and discharged      HISTORY OF PRESENT ILLNESS: Today 12/29/22:  Tracey Perkins is a 37 y.o. female with a history of OSA on CPAP. Returns today for follow-up.  Reports that CPAP is working well.  Reports that she does not feel that the heated tubing feature is working appropriately.  She was hospitalized at the beginning of December for pneumonia. Her download is below       HISTORY Tracey Perkins is a 37 year old female with an underlying complex medical history of type 1 diabetes, reflux disease, hypothyroidism, medulloblastoma with status post surgery in 1991, history of concussion, history of stroke in August 2022, neuropathy, followed by [facility] neurology, asthma, allergies, cognitive impairment, hearing loss, retinopathy, hyperlipidemia, and obesity, who reports snoring and excessive daytime somnolence, as well as witnessed apneas per family.  Reports a family history of sleep apnea, her dad has a CPAP machine, paternal uncle also has sleep apnea and paternal grandfather had sleep apnea as well.  She lives with her parents, she is currently pursuing her masters degree.  She does not drive.  She goes to bed around 9 but will be on the computer until about 11.  Rise time is between 10 and 11 AM.  She denies nightly nocturia but has had occasional morning headaches including migraines.  She has a family history of migraines as well.  He drinks caffeine in the form of coffee and soda, usually 1 serving each per day.  She is a non-smoker and does not drink any alcohol.  They have no pets at the house.   REVIEW OF SYSTEMS: Out of a complete 14 system review of symptoms, the patient complains only of the following  symptoms, and all other reviewed systems are negative.   ALLERGIES: Allergies  Allergen Reactions   Banana Swelling and Other (See Comments)    Mouth burning and swollen   Bactrim [Sulfamethoxazole-Trimethoprim] Hives   Doxycycline Hives   Gentian Violet Other (See Comments)    Mouth burns   Gentian Violet-Proflavine Sulfate [Triple Dye] Other (See Comments)    Mouth burns   Mold Extract [Trichophyton] Other (See Comments)    Hay Fever   Morphine And Codeine Hives   Other Hives, Itching and Other (See Comments)    "Hay fever, dust and pollen."  Per patient.- itching, runny nose   Sulfa Antibiotics    Vancomycin Other (See Comments)    Red man syndrome     HOME MEDICATIONS: Outpatient Medications Prior to Visit  Medication Sig Dispense Refill   acetaminophen (TYLENOL) 500 MG tablet Take 500 mg by mouth as needed for mild pain or headache.     aspirin EC 81 MG tablet Take 81 mg by mouth daily. Swallow whole.     citalopram (CELEXA) 10 MG tablet Take 0.5 tablets (5 mg total) by mouth at bedtime. 30 tablet 0   Continuous Blood Gluc Sensor (DEXCOM G6 SENSOR) MISC Apply 1 Device topically See admin instructions. Place 1 sensor into the skin every 10 days Dexcom 7     Continuous Blood Gluc Transmit (DEXCOM G6 TRANSMITTER) MISC Dexcom 7     DERMOTIC 0.01 % OIL Place 4 drops into  both ears See admin instructions. Place 4 drops into both ears 2 times a week as needed/as directed     dicyclomine (BENTYL) 20 MG tablet Take 1 tablet (20 mg total) by mouth 2 (two) times daily as needed for up to 7 days for spasms. 14 tablet 0   EPINEPHRINE 0.3 mg/0.3 mL IJ SOAJ injection Use as directed 2 each 1   FLORASTOR 250 MG capsule Take 250 mg by mouth in the morning.     fluticasone (FLONASE) 50 MCG/ACT nasal spray 2 sprays per nostril daily as needed for stuffy nose. 16 g 5   fluticasone (FLOVENT HFA) 110 MCG/ACT inhaler Inhale 2 puffs into the lungs 2 (two) times daily as needed (for asthma flares,  until cough and wheeze-free). (Patient taking differently: Inhale 2 puffs into the lungs every 30 (thirty) days.)     gabapentin (NEURONTIN) 300 MG capsule Take 300-600 mg by mouth daily. 300 MG in AM and 600 MG in PM     Glucagon 3 MG/DOSE POWD Place 3 mg into the nose as needed (low blood sugar).     insulin aspart (NOVOLOG) 100 UNIT/ML injection Inject into the skin See admin instructions. Up to 190 units a day per pump     LANTUS SOLOSTAR 100 UNIT/ML Solostar Pen as needed (uses onlyl if her insulin pump fails).     levothyroxine (SYNTHROID) 75 MCG tablet Take 75 mcg by mouth daily before breakfast.     loperamide (IMODIUM) 2 MG capsule Take 1 capsule (2 mg total) by mouth 4 (four) times daily as needed for diarrhea or loose stools. 12 capsule 0   loratadine (CLARITIN) 10 MG tablet Take 1 tablet (10 mg total) by mouth daily as needed for allergies. 30 tablet 5   lubiprostone (AMITIZA) 8 MCG capsule Take 16 mcg by mouth daily with breakfast.      magnesium oxide (MAG-OX) 400 (240 Mg) MG tablet Take 800 mg by mouth at bedtime.     meclizine (ANTIVERT) 25 MG tablet as needed.     montelukast (SINGULAIR) 10 MG tablet 1 tablet by mouth at bedtime to prevent coughing or wheezing. 90 tablet 1   Olopatadine HCl 0.6 % SOLN Place 2 sprays into both nostrils 2 (two) times daily. (Patient taking differently: Place 2 sprays into both nostrils 2 (two) times daily as needed.) 30.5 g 5   ondansetron (ZOFRAN) 4 MG tablet Take 1 tablet (4 mg total) by mouth every 4 (four) hours as needed for nausea or vomiting. 12 tablet 0   pantoprazole (PROTONIX) 20 MG tablet Take 1 tablet (20 mg total) by mouth daily. (Patient taking differently: Take 20 mg by mouth as needed.) 30 tablet 5   Polyethyl Glycol-Propyl Glycol (SYSTANE OP) Place 1 drop into both eyes 3 (three) times daily as needed (for dryness).     potassium chloride (KLOR-CON) 10 MEQ tablet Take 10 mEq by mouth daily.     pravastatin (PRAVACHOL) 20 MG tablet  Take 20 mg by mouth daily after supper.     PROAIR HFA 108 (90 Base) MCG/ACT inhaler Inhale 2 puffs into the lungs every 4 (four) hours as needed for wheezing or shortness of breath. TAKE 2 PUFFS BY MOUTH EVERY 4 HOURS AS NEEDED Strength: 108 (90 Base) MCG/ACT (Patient taking differently: Inhale 2 puffs into the lungs every 4 (four) hours as needed for wheezing or shortness of breath.) 6.7 g 0   VENTOLIN HFA 108 (90 Base) MCG/ACT inhaler Inhale 2 puffs into the  lungs every 4 (four) hours as needed for wheezing or shortness of breath. 8 g 1   No facility-administered medications prior to visit.    PAST MEDICAL HISTORY: Past Medical History:  Diagnosis Date   Asthma    Brain tumor (HCC)    Diabetes mellitus without complication (HCC)    Eczema    Food allergy    Gastroparesis    History of CVA (cerebrovascular accident) 09/03/2020   Hypokalemia    Hypomagnesemia    Hypothyroid    Intractable nausea and vomiting 07/12/2021   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   Mild cognitive impairment 09/04/2020   Neuropathy    Post concussion syndrome 05/2019   Vision changes     PAST SURGICAL HISTORY: Past Surgical History:  Procedure Laterality Date   BRAIN SURGERY     Portacath placement and removed     WISDOM TOOTH EXTRACTION      FAMILY HISTORY: Family History  Problem Relation Age of Onset   Thyroid disease Mother    Sleep apnea Father    Eczema Sister    Thyroid disease Sister    Thyroid disease Sister     SOCIAL HISTORY: Social History   Socioeconomic History   Marital status: Single    Spouse name: Not on file   Number of children: Not on file   Years of education: Not on file   Highest education level: Not on file  Occupational History   Not on file  Tobacco Use   Smoking status: Never   Smokeless tobacco: Never  Vaping Use   Vaping status: Never Used  Substance and Sexual Activity   Alcohol use: No   Drug use: No   Sexual activity: Yes    Birth  control/protection: Pill  Other Topics Concern   Not on file  Social History Narrative   Not on file   Social Drivers of Health   Financial Resource Strain: Low Risk  (09/08/2022)   Received from [facility]   Overall Financial Resource Strain (CARDIA)    Difficulty of Paying Living Expenses: Not hard at all  Food Insecurity: No Food Insecurity (12/18/2022)   Hunger Vital Sign    Worried About Running Out of Food in the Last Year: Never true    Ran Out of Food in the Last Year: Never true  Transportation Needs: No Transportation Needs (12/18/2022)   PRAPARE - Administrator, Civil Service (Medical): No    Lack of Transportation (Non-Medical): No  Physical Activity: Not on file  Stress: Not on file  Social Connections: Not on file  Intimate Partner Violence: Not At Risk (12/18/2022)   Humiliation, Afraid, Rape, and Kick questionnaire    Fear of Current or Ex-Partner: No    Emotionally Abused: No    Physically Abused: No    Sexually Abused: No      PHYSICAL EXAM  Vitals:   12/29/22 0917  BP: 126/78  Pulse: 72  Weight: 192 lb (87.1 kg)  Height: 5\' 3"  (1.6 m)   Body mass index is 34.01 kg/m.  Generalized: Well developed, in no acute distress  Chest: Lungs clear to auscultation bilaterally  Neurological examination  Mentation: Alert oriented to time, place, history taking. Follows all commands speech and language fluent Cranial nerve II-XII: Facial symmetry noted Gait and station: Gait is normal.    DIAGNOSTIC DATA (LABS, IMAGING, TESTING) - I reviewed patient records, labs, notes, testing and imaging myself where available.  Lab Results  Component  Value Date   WBC 6.3 12/23/2022   HGB 11.0 (L) 12/23/2022   HCT 34.2 (L) 12/23/2022   MCV 92.7 12/23/2022   PLT 261 12/23/2022      Component Value Date/Time   NA 136 12/23/2022 0518   K 4.0 12/23/2022 0518   CL 101 12/23/2022 0518   CO2 27 12/23/2022 0518   GLUCOSE 180 (H)  12/23/2022 0518   BUN 20 12/23/2022 0518   CREATININE 1.17 (H) 12/23/2022 0518   CALCIUM 8.6 (L) 12/23/2022 0518   PROT 6.0 (L) 12/19/2022 0446   ALBUMIN 3.0 (L) 12/19/2022 0446   AST 56 (H) 12/19/2022 0446   ALT 41 12/19/2022 0446   ALKPHOS 85 12/19/2022 0446   BILITOT 0.2 12/19/2022 0446   GFRNONAA >60 12/23/2022 0518   GFRAA >60 08/15/2019 1227   Lab Results  Component Value Date   CHOL 215 (H) 09/03/2020   HDL 40 (L) 09/03/2020   LDLCALC 135 (H) 09/03/2020   TRIG 198 (H) 09/03/2020   CHOLHDL 5.4 09/03/2020   Lab Results  Component Value Date   HGBA1C 8.8 (H) 07/13/2021   No results found for: "VITAMINB12" Lab Results  Component Value Date   TSH 0.669 07/13/2021      ASSESSMENT AND PLAN 37 y.o. year old female  has a past medical history of Asthma, Brain tumor (HCC), Diabetes mellitus without complication (HCC), Eczema, Food allergy, Gastroparesis, History of CVA (cerebrovascular accident) (09/03/2020), Hypokalemia, Hypomagnesemia, Hypothyroid, Intractable nausea and vomiting (07/12/2021), Medulloblastoma (HCC) (11/16/2015), Mild cognitive impairment (09/04/2020), Neuropathy, Post concussion syndrome (05/2019), and Vision changes. here with:  OSA on CPAP  - CPAP compliance excellent - Good treatment of AHI  - Encourage patient to use CPAP nightly and > 4 hours each night - F/U in 1 year or sooner if needed    Butch Penny, MSN, NP-C 12/29/2022, 9:16 AM [facility] [address] [place], [place] [address] [phone]

## 2023-01-19 NOTE — Progress Notes (Deleted)
 BH MD Outpatient Progress Note  01/19/2023 8:14 PM ALYSIA SCISM  MRN:  [id number]  Assessment:  LYN JOENS is a 38 y.o. female with a history of anxiety, mood disorder, OSA on CPAP, CVA, type 1 diabetes c/b diabetic gastroparesis and neuropathy, GERD, asthma, hypothyroidism, primary ovarian failure, medulloblastoma s/p chemotherapy and radiotherapy, sensorinueral hearing loss of both ear who presents in person to [facility] for medication management. She was last seen by Dr. Penne here at [facility] on 12/2021 and was on citalopram  5 mg daily.   Patient has remained complaint with citalopram  and has had continued stability in mood and anxiety.  Patient has requested to taper off citalopram  which is reasonable given her mood and anxiety have been relatively stable for the past several months.  We did discuss possibly starting counseling to better aid with her anxiety and obtaining coping skills to better address her symptoms.  Patient will follow-up in January 2025 to evaluate for continued stability at that point and consider discontinuing citalopram  and following up in February to re-evaluate.  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    Patient and plan of care will be discussed with the Attending MD, Dr. Mercy, who agrees with the above statement and plan.   Subjective:  Chief Complaint: Medication Management   Interval History:   Patient's mother and patient present as a walk-in to reestablish psychiatric care after not seeing Dr. Penne since December 2023.  She remained stable and compliant with Celexa  5 mg daily.  She reports that her psychosocial situation has been overall improved which has helped with her anxiety and depression.  She denies SI/HI/AVH.  She cites being in a relationship, nearly completing her masters degree, and avoiding certain toxic individuals is greatly aided with her mood and overall frequency of panic attacks.  She is endorses that she still has a panic attack approximately once every 2-3 weeks but she is able to cope with it at this time stating she will force herself to laugh which will allow her to focus on herself again.  She wanted to discuss coming off Celexa  because she feels that it is no longer needed.  She reports that her stability in mood and anxiety has been ongoing for the past year.  She has not had therapy in the past.  After discussion with patient and patient's mother, they have elected for patient to come off citalopram  in January as to ensure appropriate coverage over the holidays.  Patient denies any significant concerns about sleep or appetite and her sleep has dramatically improved since being on CPAP.  Visit Diagnosis:  No diagnosis found.   Past Psychiatric History:  Diagnoses: MDD, GAD  Past Medical History:  Past Medical History:  Diagnosis Date   Asthma    Brain tumor (HCC)    Diabetes mellitus without complication (HCC)    Eczema    Food allergy     Gastroparesis    History of CVA (cerebrovascular accident) 09/03/2020   Hypokalemia  Hypomagnesemia    Hypothyroid    Intractable nausea and vomiting 07/12/2021   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   Mild cognitive impairment 09/04/2020   Neuropathy    Post concussion syndrome 05/2019   Vision changes     Past Surgical History:  Procedure Laterality Date   BRAIN SURGERY     Portacath placement and removed      WISDOM TOOTH EXTRACTION      Family Psychiatric History: Maternal uncle schizophrenia and overdosed on pain pills. Paternal cousin and aunt bipolar   Family History:  Family History  Problem Relation Age of Onset   Thyroid disease Mother    Sleep apnea Father    Eczema Sister    Thyroid disease Sister    Thyroid disease Sister     Social History:  Academic/Vocational: gaffer Social History   Socioeconomic History   Marital status: Single    Spouse name: Not on file   Number of children: Not on file   Years of education: Not on file   Highest education level: Not on file  Occupational History   Not on file  Tobacco Use   Smoking status: Never   Smokeless tobacco: Never  Vaping Use   Vaping status: Never Used  Substance and Sexual Activity   Alcohol  use: No   Drug use: No   Sexual activity: Yes    Birth control/protection: Pill  Other Topics Concern   Not on file  Social History Narrative   Not on file   Social Drivers of Health   Financial Resource Strain: Low Risk  (09/08/2022)   Received from [facility]   Overall Financial Resource Strain (CARDIA)    Difficulty of Paying Living Expenses: Not hard at all  Food Insecurity: No Food Insecurity (12/18/2022)   Hunger Vital Sign    Worried About Running Out of Food in the Last Year: Never true    Ran Out of Food in the Last Year: Never true  Transportation Needs: No Transportation Needs (12/18/2022)   PRAPARE - Administrator, Civil Service (Medical): No    Lack of Transportation (Non-Medical): No  Physical Activity: Not on file  Stress: Not on file  Social Connections: Not on file    Allergies:  Allergies  Allergen Reactions   Banana Swelling and Other (See Comments)    Mouth burning and swollen   Bactrim [Sulfamethoxazole-Trimethoprim] Hives   Doxycycline Hives   Gentian Violet Other (See Comments)    Mouth burns   Gentian Violet-Proflavine Sulfate [Triple Dye]  Other (See Comments)    Mouth burns   Mold Extract [Trichophyton] Other (See Comments)    Hay Fever   Morphine And Codeine Hives   Other Hives, Itching and Other (See Comments)    Hay fever, dust and pollen.  Per patient.- itching, runny nose   Sulfa Antibiotics    Vancomycin  Other (See Comments)    Red man syndrome     Current Medications: Current Outpatient Medications  Medication Sig Dispense Refill   acetaminophen  (TYLENOL ) 500 MG tablet Take 500 mg by mouth as needed for mild pain or headache.     aspirin  EC 81 MG tablet Take 81 mg by mouth daily. Swallow whole.     citalopram  (CELEXA ) 10 MG tablet Take 0.5 tablets (5 mg total) by mouth at bedtime. 30 tablet 0   Continuous Blood Gluc Sensor (DEXCOM G6 SENSOR) MISC Apply 1 Device topically See admin instructions. Place 1 sensor into the skin  every 10 days Dexcom 7     Continuous Blood Gluc Transmit (DEXCOM G6 TRANSMITTER) MISC Dexcom 7     DERMOTIC 0.01 % OIL Place 4 drops into both ears See admin instructions. Place 4 drops into both ears 2 times a week as needed/as directed     dicyclomine  (BENTYL ) 20 MG tablet Take 1 tablet (20 mg total) by mouth 2 (two) times daily as needed for up to 7 days for spasms. 14 tablet 0   EPINEPHRINE  0.3 mg/0.3 mL IJ SOAJ injection Use as directed 2 each 1   FLORASTOR 250 MG capsule Take 250 mg by mouth in the morning.     fluticasone  (FLONASE ) 50 MCG/ACT nasal spray 2 sprays per nostril daily as needed for stuffy nose. 16 g 5   fluticasone  (FLOVENT  HFA) 110 MCG/ACT inhaler Inhale 2 puffs into the lungs 2 (two) times daily as needed (for asthma flares, until cough and wheeze-free). (Patient taking differently: Inhale 2 puffs into the lungs every 30 (thirty) days.)     gabapentin  (NEURONTIN ) 300 MG capsule Take 300-600 mg by mouth daily. 300 MG in AM and 600 MG in PM     Glucagon  3 MG/DOSE POWD Place 3 mg into the nose as needed (low blood sugar).     insulin  aspart (NOVOLOG ) 100 UNIT/ML injection  Inject into the skin See admin instructions. Up to 190 units a day per pump     LANTUS SOLOSTAR 100 UNIT/ML Solostar Pen as needed (uses onlyl if her insulin  pump fails).     levothyroxine  (SYNTHROID ) 75 MCG tablet Take 75 mcg by mouth daily before breakfast.     loperamide  (IMODIUM ) 2 MG capsule Take 1 capsule (2 mg total) by mouth 4 (four) times daily as needed for diarrhea or loose stools. 12 capsule 0   loratadine  (CLARITIN ) 10 MG tablet Take 1 tablet (10 mg total) by mouth daily as needed for allergies. 30 tablet 5   lubiprostone  (AMITIZA ) 8 MCG capsule Take 16 mcg by mouth daily with breakfast.      magnesium  oxide (MAG-OX) 400 (240 Mg) MG tablet Take 800 mg by mouth at bedtime.     meclizine  (ANTIVERT ) 25 MG tablet as needed.     montelukast  (SINGULAIR ) 10 MG tablet 1 tablet by mouth at bedtime to prevent coughing or wheezing. 90 tablet 1   Olopatadine  HCl 0.6 % SOLN Place 2 sprays into both nostrils 2 (two) times daily. (Patient taking differently: Place 2 sprays into both nostrils 2 (two) times daily as needed.) 30.5 g 5   ondansetron  (ZOFRAN ) 4 MG tablet Take 1 tablet (4 mg total) by mouth every 4 (four) hours as needed for nausea or vomiting. 12 tablet 0   pantoprazole  (PROTONIX ) 20 MG tablet Take 1 tablet (20 mg total) by mouth daily. (Patient taking differently: Take 20 mg by mouth as needed.) 30 tablet 5   Polyethyl Glycol-Propyl Glycol (SYSTANE OP) Place 1 drop into both eyes 3 (three) times daily as needed (for dryness).     potassium chloride  (KLOR-CON ) 10 MEQ tablet Take 10 mEq by mouth daily.     pravastatin  (PRAVACHOL ) 20 MG tablet Take 20 mg by mouth daily after supper.     PROAIR  HFA 108 (90 Base) MCG/ACT inhaler Inhale 2 puffs into the lungs every 4 (four) hours as needed for wheezing or shortness of breath. TAKE 2 PUFFS BY MOUTH EVERY 4 HOURS AS NEEDED Strength: 108 (90 Base) MCG/ACT (Patient taking differently: Inhale 2 puffs into the  lungs every 4 (four) hours as needed for  wheezing or shortness of breath.) 6.7 g 0   VENTOLIN  HFA 108 (90 Base) MCG/ACT inhaler Inhale 2 puffs into the lungs every 4 (four) hours as needed for wheezing or shortness of breath. 8 g 1   No current facility-administered medications for this visit.    ROS: Review of Systems   Objective:  Psychiatric Specialty Exam: There were no vitals taken for this visit.There is no height or weight on file to calculate BMI.  General Appearance: Casual  Eye Contact: Good  Speech:  Clear and Coherent  Volume:  Normal  Mood:  Euthymic  Affect:  Appropriate and Congruent  Thought Content: Logical   Suicidal Thoughts:  No  Homicidal Thoughts:  No  Thought Process:  Coherent, Goal Directed, and Linear  Orientation:  Full (Time, Place, and Person)    Memory: Remote;   Fair  Judgment:  Intact  Insight:  Fair  Concentration:  Concentration: Good  Recall: not formally assessed   Fund of Knowledge: Fair  Language: Fair  Psychomotor Activity:  Normal  Akathisia:  No  AIMS (if indicated): not done  Assets:  Communication Skills Desire for Improvement Financial Resources/Insurance Housing Intimacy Leisure Time Resilience Social Support Talents/Skills Transportation Vocational/Educational  ADL's:  Intact  Cognition: WNL  Sleep:  Good   PE: General: well-appearing; no acute distress  Pulm: no increased work of breathing on room air  Strength & Muscle Tone: within normal limits Neuro: no focal neurological deficits observed  Gait & Station: normal  Metabolic Disorder Labs: Lab Results  Component Value Date   HGBA1C 8.8 (H) 07/13/2021   MPG 206 07/13/2021   MPG 197.25 09/03/2020   No results found for: PROLACTIN Lab Results  Component Value Date   CHOL 215 (H) 09/03/2020   TRIG 198 (H) 09/03/2020   HDL 40 (L) 09/03/2020   CHOLHDL 5.4 09/03/2020   VLDL 40 09/03/2020   LDLCALC 135 (H) 09/03/2020   Lab Results  Component Value Date   TSH 0.669 07/13/2021     Therapeutic Level Labs: No results found for: LITHIUM No results found for: VALPROATE No results found for: CBMZ  Screenings: GAD-7    Flowsheet Row Video Visit from 09/02/2021 in [facility] Video Visit from 03/11/2021 in [facility] Video Visit from 07/08/2020 in [facility] Video Visit from 04/20/2020 in [facility]  Total GAD-7 Score 6 7 8 7       PHQ2-9    Flowsheet Row Video Visit from 09/02/2021 in [facility] Video Visit from 03/11/2021 in [facility] Video Visit from 07/08/2020 in [facility] Video Visit from 04/20/2020 in [facility] Counselor from 09/17/2019 in [facility]  PHQ-2 Total Score 0 0 1 0 2  PHQ-9 Total Score 2 3 9 6 7       Flowsheet Row ED to Hosp-Admission (Discharged) from 12/18/2022 in [facility] ED from 10/14/2022 in [facility] Emergency Department at [facility] ED from 03/27/2022 in [facility] Emergency Department at [facility]  C-SSRS RISK CATEGORY No Risk No Risk No Risk       Collaboration of Care: Collaboration of Care:  Patient/Guardian was advised Release of Information must be obtained prior to any record release in order to collaborate their care with an outside  provider. Patient/Guardian was advised if they have not already done so to contact the registration department to sign all necessary forms in order for us  to release information regarding their care.   Consent: Patient/Guardian gives verbal consent for treatment and assignment of benefits for services provided during this visit. Patient/Guardian expressed understanding and agreed to proceed.   Prentice Espy, MD 01/19/2023, 8:14 PM

## 2023-01-20 ENCOUNTER — Encounter (HOSPITAL_COMMUNITY): Payer: Self-pay

## 2023-01-20 ENCOUNTER — Telehealth (HOSPITAL_COMMUNITY): Payer: Medicaid Other | Admitting: Student

## 2023-01-27 ENCOUNTER — Telehealth (HOSPITAL_COMMUNITY): Payer: Medicaid Other | Admitting: Student

## 2023-01-30 ENCOUNTER — Other Ambulatory Visit: Payer: Self-pay | Admitting: Family Medicine

## 2023-02-08 ENCOUNTER — Other Ambulatory Visit (HOSPITAL_COMMUNITY): Payer: Self-pay | Admitting: Psychiatry

## 2023-02-08 DIAGNOSIS — F330 Major depressive disorder, recurrent, mild: Secondary | ICD-10-CM

## 2023-02-08 DIAGNOSIS — F419 Anxiety disorder, unspecified: Secondary | ICD-10-CM

## 2023-02-14 ENCOUNTER — Other Ambulatory Visit (HOSPITAL_COMMUNITY): Payer: Self-pay

## 2023-02-14 NOTE — Progress Notes (Cosign Needed)
BH MD Outpatient Progress Note  02/16/2023 2:20 PM JARITA RAVAL  MRN:  [id number]  Assessment:  Tracey Perkins is a 38 y.o. female with a history of anxiety, mood disorder, OSA on CPAP, CVA, type 1 diabetes c/b diabetic gastroparesis and neuropathy, GERD, asthma, hypothyroidism, primary ovarian failure, medulloblastoma s/p chemotherapy and radiotherapy, sensorinueral hearing loss of both ear who presents in person to [facility] for medication management. She was last seen by Dr. Leone Haven here at [facility] on 12/2021 and was on citalopram 5 mg daily.   Patient has remained complaint with citalopram and has had continued stability in mood and anxiety.  Given patient's stability in regards to her mental health and generalized anxiety disorder/MDD, a trial of being completely off citalopram will be done.  Patient will follow-up in 2 months to evaluate for the need to restart a SSRI.  Patient can follow-up as needed should she remain stable in the next 2 months.  She is on gabapentin due to diabetic neuropathy but it is also likely aiding with any anxiety.  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    Patient and plan of care will be discussed with the Attending MD, Dr. Josephina Shih, who agrees with the above statement and plan.   Subjective:  Chief Complaint: Medication Management   Interval History:  Patient presents virtually and mom did briefly  step into view to clarify medication dosage.  She is overall doing well stating that she graduated in December as well as remaining optimistic and goal oriented.  She denies SI/HI/AVH.  She reports continuing to sleep and eat well.  She is amenable to stopping Celexa to evaluate if she needs to continue taking this medication or her GAD is presently well-managed without it.  Mother stopped by to clarify the dosage of Celexa she was on and mom also agrees that it is okay to stop Celexa at this time.  All questions were addressed and patient is to follow-up in 2 months to evaluate for any changes in mental health.  Patient had multiple medical problems arise in the past month and felt that it did not dramatically affect her anxiety or depression in any way.  Visit Diagnosis:  No diagnosis found.   Past Psychiatric History:  Diagnoses: MDD, GAD  Past Medical History:  Past Medical History:  Diagnosis Date   Asthma    Brain tumor (HCC)    Diabetes mellitus without complication (HCC)    Eczema    Food allergy    Gastroparesis    History of CVA (cerebrovascular accident) 09/03/2020   Hypokalemia    Hypomagnesemia    Hypothyroid    Intractable nausea and vomiting 07/12/2021   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   Mild cognitive impairment 09/04/2020   Neuropathy    Post concussion syndrome 05/2019   Vision changes     Past Surgical History:  Procedure Laterality Date   BRAIN SURGERY     Portacath placement and removed     WISDOM TOOTH EXTRACTION  Family Psychiatric History: Maternal uncle schizophrenia and overdosed on pain pills. Paternal cousin and aunt bipolar   Family History:  Family History  Problem Relation Age of Onset   Thyroid disease Mother    Sleep apnea Father    Eczema Sister    Thyroid disease Sister    Thyroid disease Sister     Social History:  Academic/Vocational: Gaffer Social History   Socioeconomic History   Marital status: Single     Spouse name: Not on file   Number of children: Not on file   Years of education: Not on file   Highest education level: Not on file  Occupational History   Not on file  Tobacco Use   Smoking status: Never   Smokeless tobacco: Never  Vaping Use   Vaping status: Never Used  Substance and Sexual Activity   Alcohol use: No   Drug use: No   Sexual activity: Yes    Birth control/protection: Pill  Other Topics Concern   Not on file  Social History Narrative   Not on file   Social Drivers of Health   Financial Resource Strain: Low Risk  (09/08/2022)   Received from [facility]   Overall Financial Resource Strain (CARDIA)    Difficulty of Paying Living Expenses: Not hard at all  Food Insecurity: No Food Insecurity (12/18/2022)   Hunger Vital Sign    Worried About Running Out of Food in the Last Year: Never true    Ran Out of Food in the Last Year: Never true  Transportation Needs: No Transportation Needs (12/18/2022)   PRAPARE - Administrator, Civil Service (Medical): No    Lack of Transportation (Non-Medical): No  Physical Activity: Not on file  Stress: Not on file  Social Connections: Not on file    Allergies:  Allergies  Allergen Reactions   Banana Swelling and Other (See Comments)    Mouth burning and swollen   Bactrim [Sulfamethoxazole-Trimethoprim] Hives   Doxycycline Hives   Gentian Violet Other (See Comments)    Mouth burns   Gentian Violet-Proflavine Sulfate [Triple Dye] Other (See Comments)    Mouth burns   Mold Extract [Trichophyton] Other (See Comments)    Hay Fever   Morphine And Codeine Hives   Other Hives, Itching and Other (See Comments)    "Hay fever, dust and pollen."  Per patient.- itching, runny nose   Sulfa Antibiotics    Vancomycin Other (See Comments)    Red man syndrome     Current Medications: Current Outpatient Medications  Medication Sig Dispense Refill   acetaminophen (TYLENOL) 500 MG tablet Take 500 mg by  mouth as needed for mild pain or headache.     aspirin EC 81 MG tablet Take 81 mg by mouth daily. Swallow whole.     citalopram (CELEXA) 10 MG tablet TAKE 0.5 TABLETS BY MOUTH AT BEDTIME. 15 tablet 7   Continuous Blood Gluc Sensor (DEXCOM G6 SENSOR) MISC Apply 1 Device topically See admin instructions. Place 1 sensor into the skin every 10 days Dexcom 7     Continuous Blood Gluc Transmit (DEXCOM G6 TRANSMITTER) MISC Dexcom 7     DERMOTIC 0.01 % OIL Place 4 drops into both ears See admin instructions. Place 4 drops into both ears 2 times a week as needed/as directed     dicyclomine (BENTYL) 20 MG tablet Take 1 tablet (20 mg total) by mouth 2 (two) times daily as needed for up to 7  days for spasms. 14 tablet 0   EPINEPHRINE 0.3 mg/0.3 mL IJ SOAJ injection Use as directed 2 each 1   FLORASTOR 250 MG capsule Take 250 mg by mouth in the morning.     fluticasone (FLONASE) 50 MCG/ACT nasal spray 2 sprays per nostril daily as needed for stuffy nose. 16 g 5   fluticasone (FLOVENT HFA) 110 MCG/ACT inhaler Inhale 2 puffs into the lungs 2 (two) times daily as needed (for asthma flares, until cough and wheeze-free). (Patient taking differently: Inhale 2 puffs into the lungs every 30 (thirty) days.)     gabapentin (NEURONTIN) 300 MG capsule Take 300-600 mg by mouth daily. 300 MG in AM and 600 MG in PM     Glucagon 3 MG/DOSE POWD Place 3 mg into the nose as needed (low blood sugar).     insulin aspart (NOVOLOG) 100 UNIT/ML injection Inject into the skin See admin instructions. Up to 190 units a day per pump     LANTUS SOLOSTAR 100 UNIT/ML Solostar Pen as needed (uses onlyl if her insulin pump fails).     levothyroxine (SYNTHROID) 75 MCG tablet Take 75 mcg by mouth daily before breakfast.     loperamide (IMODIUM) 2 MG capsule Take 1 capsule (2 mg total) by mouth 4 (four) times daily as needed for diarrhea or loose stools. 12 capsule 0   loratadine (CLARITIN) 10 MG tablet TAKE 1 TABLET BY MOUTH EVERY DAY AS NEEDED  FOR ALLERGY 30 tablet 5   lubiprostone (AMITIZA) 8 MCG capsule Take 16 mcg by mouth daily with breakfast.      magnesium oxide (MAG-OX) 400 (240 Mg) MG tablet Take 800 mg by mouth at bedtime.     meclizine (ANTIVERT) 25 MG tablet as needed.     montelukast (SINGULAIR) 10 MG tablet 1 tablet by mouth at bedtime to prevent coughing or wheezing. 90 tablet 1   Olopatadine HCl 0.6 % SOLN Place 2 sprays into both nostrils 2 (two) times daily. (Patient taking differently: Place 2 sprays into both nostrils 2 (two) times daily as needed.) 30.5 g 5   ondansetron (ZOFRAN) 4 MG tablet Take 1 tablet (4 mg total) by mouth every 4 (four) hours as needed for nausea or vomiting. 12 tablet 0   pantoprazole (PROTONIX) 20 MG tablet Take 1 tablet (20 mg total) by mouth daily. (Patient taking differently: Take 20 mg by mouth as needed.) 30 tablet 5   Polyethyl Glycol-Propyl Glycol (SYSTANE OP) Place 1 drop into both eyes 3 (three) times daily as needed (for dryness).     potassium chloride (KLOR-CON) 10 MEQ tablet Take 10 mEq by mouth daily.     pravastatin (PRAVACHOL) 20 MG tablet Take 20 mg by mouth daily after supper.     PROAIR HFA 108 (90 Base) MCG/ACT inhaler Inhale 2 puffs into the lungs every 4 (four) hours as needed for wheezing or shortness of breath. TAKE 2 PUFFS BY MOUTH EVERY 4 HOURS AS NEEDED Strength: 108 (90 Base) MCG/ACT (Patient taking differently: Inhale 2 puffs into the lungs every 4 (four) hours as needed for wheezing or shortness of breath.) 6.7 g 0   VENTOLIN HFA 108 (90 Base) MCG/ACT inhaler Inhale 2 puffs into the lungs every 4 (four) hours as needed for wheezing or shortness of breath. 8 g 1   No current facility-administered medications for this visit.    ROS: Review of Systems   Objective:  Psychiatric Specialty Exam: There were no vitals taken for this visit.There is  no height or weight on file to calculate BMI.  General Appearance: Casual  Eye Contact: Good  Speech:  Clear and  Coherent  Volume:  Normal  Mood:  Euthymic  Affect:  Appropriate and Congruent  Thought Content: Logical   Suicidal Thoughts:  No  Homicidal Thoughts:  No  Thought Process:  Coherent, Goal Directed, and Linear  Orientation:  Full (Time, Place, and Person)    Memory: Remote;   Fair  Judgment:  Intact  Insight:  Fair  Concentration:  Concentration: Good  Recall: not formally assessed   Fund of Knowledge: Fair  Language: Fair  Psychomotor Activity:  Normal  Akathisia:  No  AIMS (if indicated): not done  Assets:  Communication Skills Desire for Improvement Financial Resources/Insurance Housing Intimacy Leisure Time Resilience Social Support Talents/Skills Transportation Vocational/Educational  ADL's:  Intact  Cognition: WNL  Sleep:  Good   PE: General: well-appearing; no acute distress  Pulm: no increased work of breathing on room air  Strength & Muscle Tone: within normal limits Neuro: no focal neurological deficits observed  Gait & Station: normal  Metabolic Disorder Labs: Lab Results  Component Value Date   HGBA1C 8.8 (H) 07/13/2021   MPG 206 07/13/2021   MPG 197.25 09/03/2020   No results found for: "PROLACTIN" Lab Results  Component Value Date   CHOL 215 (H) 09/03/2020   TRIG 198 (H) 09/03/2020   HDL 40 (L) 09/03/2020   CHOLHDL 5.4 09/03/2020   VLDL 40 09/03/2020   LDLCALC 135 (H) 09/03/2020   Lab Results  Component Value Date   TSH 0.669 07/13/2021    Therapeutic Level Labs: No results found for: "LITHIUM" No results found for: "VALPROATE" No results found for: "CBMZ"  Screenings: GAD-7    Flowsheet Row Video Visit from 09/02/2021 in [facility] Video Visit from 03/11/2021 in [facility] Video Visit from 07/08/2020 in [facility] Video Visit from 04/20/2020 in [facility]  Total GAD-7 Score 6 7 8 7       PHQ2-9    Flowsheet Row  Video Visit from 09/02/2021 in [facility] Video Visit from 03/11/2021 in [facility] Video Visit from 07/08/2020 in [facility] Video Visit from 04/20/2020 in [facility] Counselor from 09/17/2019 in [facility]  PHQ-2 Total Score 0 0 1 0 2  PHQ-9 Total Score 2 3 9 6 7       Flowsheet Row ED to Hosp-Admission (Discharged) from 12/18/2022 in [place] [facility] 4TH FLOOR PROGRESSIVE CARE AND UROLOGY ED from 10/14/2022 in [facility] Department at [facility] ED from 03/27/2022 in [facility] Emergency Department at [facility]  C-SSRS RISK CATEGORY No Risk No Risk No Risk       Collaboration of Care: Collaboration of Care:  Patient/Guardian was advised Release of Information must be obtained prior to any record release in order to collaborate their care with an outside provider. Patient/Guardian was advised if they have not already done so to contact the registration department to sign all necessary forms in order for [place] to release information regarding their care.   Consent: Patient/Guardian gives verbal consent for treatment and assignment of benefits for services provided during this visit. Patient/Guardian expressed understanding and agreed to proceed.   Televisit via video: I connected with Mosie Epstein on 02/17/23 at  3:30 PM EST by a video  enabled telemedicine application and verified that I am speaking with the correct person using two identifiers.  Location: Patient: home Provider: office   I discussed the limitations of evaluation and management by telemedicine and the availability of in person appointments. The patient expressed understanding and agreed to proceed.  I discussed the assessment and treatment plan with the patient. The patient was provided an opportunity to ask questions and all were answered. The patient  agreed with the plan and demonstrated an understanding of the instructions.   The patient was advised to call back or seek an in-person evaluation if the symptoms worsen or if the condition fails to improve as anticipated.  Park Pope, MD 02/14/2023, 2:20 PM

## 2023-02-16 ENCOUNTER — Telehealth (INDEPENDENT_AMBULATORY_CARE_PROVIDER_SITE_OTHER): Payer: Medicaid Other | Admitting: Student

## 2023-02-16 DIAGNOSIS — F411 Generalized anxiety disorder: Secondary | ICD-10-CM | POA: Diagnosis not present

## 2023-02-16 DIAGNOSIS — F330 Major depressive disorder, recurrent, mild: Secondary | ICD-10-CM

## 2023-02-16 DIAGNOSIS — F419 Anxiety disorder, unspecified: Secondary | ICD-10-CM

## 2023-02-16 DIAGNOSIS — F320 Major depressive disorder, single episode, mild: Secondary | ICD-10-CM

## 2023-02-16 DIAGNOSIS — F3342 Major depressive disorder, recurrent, in full remission: Secondary | ICD-10-CM | POA: Diagnosis not present

## 2023-02-17 ENCOUNTER — Encounter (HOSPITAL_COMMUNITY): Payer: Self-pay | Admitting: Student

## 2023-02-21 ENCOUNTER — Telehealth (HOSPITAL_COMMUNITY): Payer: Medicaid Other | Admitting: Student

## 2023-02-22 ENCOUNTER — Encounter: Payer: Self-pay | Admitting: Adult Health

## 2023-02-24 ENCOUNTER — Telehealth (HOSPITAL_COMMUNITY): Payer: Medicaid Other | Admitting: Student

## 2023-02-27 NOTE — Telephone Encounter (Signed)
 I am not familiar with this drug. Maybe discuss with eye doctor

## 2023-03-04 ENCOUNTER — Other Ambulatory Visit (HOSPITAL_COMMUNITY): Payer: Self-pay | Admitting: Psychiatry

## 2023-03-04 DIAGNOSIS — F330 Major depressive disorder, recurrent, mild: Secondary | ICD-10-CM

## 2023-03-04 DIAGNOSIS — F419 Anxiety disorder, unspecified: Secondary | ICD-10-CM

## 2023-03-24 NOTE — Progress Notes (Signed)
 [facility]   Primary Care Physician: Janey Searcy SAUNDERS, MD [address] [place] [address]  Chief Complaint  Patient presents with  . Follow-up    diabetes    Subjective: Copied and modified from previous note  Tracey Perkins  is a 38 y.o.female with a history of medulloblastoma  and evidence of CVA on MRI who presents for type 1 diabetes and hypothyroidism. She was diagnosed in August 1995 and had been followed by Lamar Katz in Pediatric Endocrinology. Graduated from [employer] with major in [place]. A1c was 7.8% in March 20.   She has peripheral neuropathy 2/2  vincristine therapy for meduloblastoma (dx in 1999 s/p resection and XRT). Notes improvement in neuropathy since starting Vitamin  b12.  Seeing neurology. Her statin was discontinued by her PCP due to leg pains.   She is finishing up her last semester of grad school. She is using exercise mode during class to avoid lows.    Interval history 03/24/2023:  [name] reports that she has had trouble with her glucose control, especially in the evenings. She has had once weekly She required hospitalization at [facility] for double pneumonia in December 2024 that was treated with antibiotic therapy.   Blood glucose in evening runs higher when she does not enter carbs for the meal when she is fearful of low glucose from gastroparesis.  She notes hypoglycemia from Control IQ correction doses.  The patient denies symptoms of polyuria, nocturia, polydipsia, blurry vision, but she reports worsening foot parasthesias/numbness.        Diabetes HCM:  Glucagon  kit: yes Medic alert ID: yes Eye exam: followed regularly every 6 months . Has nystagmus which is being followed closely. NPDR,  Urine microalbumin: needs updating  Lipids: pravastatin  Off pump: yes--lantus 36 units  ACE inhibitor: no Statin: yes Flu shot : up to date HepB series: believes yes, unsure OCPs: yes micronor  Cardiac: EKG and Stress  test 2004, normal   Past Medical History:  Diagnosis Date  . Allergy Morhine, Vancomycin , Doxycycline, Hay Fever  . Autoimmune disease (CMS/HHS-HCC) Hypothyroidism, Juvenile Diabetes  . Cancer (CMS/HHS-HCC) Brain Cancer Survivor   Hx: Medulloblastoma in 1999  . Chronic lymphocytic thyroiditis   . Depression 05/2019   after concussion  . GERD (gastroesophageal reflux disease) several years ago?  SABRA Hyperlipidemia couple years ago  . Hypokalemia   . Hypomagnesemia   . Medulloblastoma (CMS/HHS-HCC)   . Migraine headache 2021  . Neuro-degenerative disorders (CMS-HCC) Memory loss  . Neuropathy 1999   from chemo and diabetes  . Primary ovarian failure   . Status post radiation therapy   . Stroke (cerebrum) (CMS/HHS-HCC)    Per patient seen on last MRI  . Type I (juvenile type) diabetes mellitus without mention of complication, uncontrolled 04/13/2011    Medical, Surgical, and Family History: Patient Active Problem List  Diagnosis  . Uncontrolled type 1 diabetes mellitus  . Chronic lymphocytic thyroiditis  . Primary ovarian failure  . Status post radiation therapy  . Sensorineural hearing loss of both ears  . Disequilibrium  . Medulloblastoma, childhood (CMS-HCC)  . Nystagmus  . Visual field defect  . Mild intermittent asthma with acute exacerbation  . Multifactorial gait disorder  . Polyneuropathy  . Neurocognitive disorder  . Mood disorder (CMS-HCC)  . Insulin  pump status  . Post concussive syndrome  . Diabetic polyneuropathy associated with type 1 diabetes mellitus (CMS-HCC)   Past Surgical History:  Procedure Laterality Date  . CRANIOTOMY  1999  .  BRAIN SURGERY     Chemo, XRT  . CRANIOPLASTY FOR REPAIR SKULL DEFECT W/REPARATIVE BRAIN SURGERY     Family History  Problem Relation Age of Onset  . Thyroid disease Mother   . Hyperlipidemia (Elevated cholesterol) Mother   . High blood pressure (Hypertension) Mother   . Thyroid disease Sister   . Diabetes type I Neg  Hx   . Diabetes type II Neg Hx     Medications: Current Outpatient Medications Ordered in Epic  Medication Sig Dispense Refill  . acetaminophen  (TYLENOL ) 500 MG tablet Take 500 mg by mouth as needed for Pain       . albuterol  90 mcg/actuation inhaler Inhale 2 inhalations into the lungs every 6 (six) hours as needed 1 Inhaler 1  . aspirin  81 MG EC tablet Take 1 tablet (81 mg total) by mouth once daily 30 tablet 0  . azelastine  (ASTELIN ) 137 mcg nasal spray Place 2 sprays into both nostrils once daily as needed for Rhinitis     5  . blood glucose control high,low (ACCU-CHEK GUIDE L1-L2 CTRL SOL) Soln Use 1 each as directed 1 each 11  . blood glucose diagnostic test strip 1 each (1 strip total) 3 (three) times daily Use as instructed. 300 each 3  . blood glucose meter kit as directed 1 each 0  . cetirizine  (ZYRTEC ) 10 MG tablet Take 10 mg by mouth once daily  5  . citalopram  (CELEXA ) 10 MG tablet Take 0.5 tablets (5 mg total) by mouth nightly for 30 days 15 tablet 0  . cyanocobalamin  (VITAMIN B12) 1000 MCG tablet Take 1 tablet (1,000 mcg total) by mouth once daily (Patient taking differently: Take 1,000 mcg by mouth every Monday) 90 tablet 3  . DEXCOM G6 TRANSMITTER Devi USE 1 EACH EVERY 3 (THREE) MONTHS 1 each 3  . fluconazole (DIFLUCAN) 150 MG tablet TAKE 1 TABLET BY MOUTH ONCE A WEEK 12 tablet 3  . fluocinolone acetonide (DERMOTIC) 0.01 % otic drop 4 drops in each ear twice a week 20 mL 3  . fluticasone  (FLONASE ) 50 mcg/actuation nasal spray Place 1 spray into both nostrils as needed. 16 g 3  . fluticasone  propionate (FLOVENT  HFA) 110 mcg/actuation inhaler Inhale 1 inhalation into the lungs 2 (two) times daily    . gabapentin  (NEURONTIN ) 300 MG capsule Take 1 capsule (300 mg total) by mouth 3 (three) times daily 90 capsule 11  . glucagon  (BAQSIMI ) 3 mg/actuation nasal spray 1 spray x 1 dose, repeat in 15 min      . glucagon  (GLUCAGEN) 1 mg/mL injection Inject 1 mg as needed for hypoglycemia     . insulin  ASPART (NOVOLOG  U-100 INSULIN  ASPART) injection (concentration 100 units/mL) USE UP TO 180 UNITS DAILY, AS DIRECTED IN INSULIN  PUMP. 60 mL 11  . insulin  GLARGINE (LANTUS) injection (concentration 100 units/mL) Inject 36 units once daily for off pump plan 10 mL 5  . lancets Use 1 each 3 (three) times daily Use as instructed. 300 each 3  . levothyroxine  (SYNTHROID ) 100 MCG tablet Take one tablet by mouth on Monday through Saturday and one half tablet on Sunday 84 tablet 3  . loratadine  (CLARITIN ) 10 mg tablet Take 10 mg by mouth once daily    . lubiprostone  (AMITIZA ) 8 MCG capsule Take 1 capsule (8 mcg total) by mouth 2 (two) times daily (Patient taking differently: Take 16 mcg by mouth once daily) 62 capsule 11  . magnesium  oxide (MAG-OX) 400 mg (241.3 mg magnesium ) tablet Take  2 tablets (800 mg total) by mouth once daily 30 tablet 1  . meclizine  (ANTIVERT ) 25 mg tablet 1 po BID prn dizziness    . montelukast  (SINGULAIR ) 10 mg tablet Take 10 mg by mouth nightly       . omega-3 fatty acids 1,000 mg capsule Take 1 capsule (1 g total) by mouth 2 (two) times daily 60 capsule 11  . omeprazole  (PRILOSEC) 20 MG DR capsule     . OMNIPOD 5 G6 INTRO KIT, GEN 5, Crtg Use 1 Device continuously as needed 1 each 0  . OMNIPOD 5 G6 PODS, GEN 5, Crtg Inject 1 Device subcutaneously every other day 40 each 4  . ondansetron  (ZOFRAN ) 4 MG tablet Take 4 mg by mouth every 8 (eight) hours as needed    . pantoprazole  (PROTONIX ) 20 MG DR tablet Take 20 mg by mouth once daily    . potassium chloride  (KLOR-CON ) 10 MEQ ER tablet TAKE 1 TABLET BY MOUTH EVERY DAY 90 tablet 3  . progesterone (PROMETRIUM) 100 MG capsule Take 100 mg by mouth once daily    . rosuvastatin  (CRESTOR ) 5 MG tablet Take 5 mg by mouth once daily    . Saccharomyces boulardii (FLORASTOR) 250 mg capsule Take 250 mg by mouth 2 (two) times daily    . clopidogreL  (PLAVIX ) 75 mg tablet Take 75 mg by mouth once daily (Patient not taking: Reported on  03/12/2021)    . DEXCOM G6 SENSOR Devi Use 1 each every 10 (ten) days for 90 days 9 each 3  . fluconazole (DIFLUCAN) 150 MG tablet Repeat tab 48-72 hours later if symptoms persist (Patient not taking: Reported on 03/24/2021) 2 tablet 5  . gabapentin  (NEURONTIN ) 300 MG capsule Take 1 capsule (300 mg total) by mouth 2 (two) times daily (Patient not taking: Reported on 03/31/2021) 60 capsule 9  . norethindrone (MICRONOR) 0.35 mg tablet Take 1 tablet by mouth once daily (Patient not taking: Reported on 03/24/2021)    . norgestimate -ethinyl estradioL  (SPRINTEC  0.25/35, 28,) 0.25-35 mg-mcg tablet Take 1 tablet by mouth once daily (Patient not taking: Reported on 11/02/2020)    . pravastatin  (PRAVACHOL ) 20 MG tablet Take 1 tablet (20 mg total) by mouth at bedtime for 90 days (Patient not taking: Reported on 03/12/2021) 30 tablet 2   No current Epic-ordered facility-administered medications on file.    Allergies Allergies  Allergen Reactions  . Bactrim [Sulfamethoxazole-Trimethoprim] Hives and Rash  . Vancomycin  Vancomycin  Infusion Reaction  . Banana Other (See Comments)    Mouth burning  . Doxycycline Hives  . Gentian Violet Unknown    It burns.  Per mother  . Grass Pollen Unknown    Hay fever  . House Dust Unknown  . Morphine Hives       Objective: BP 101/58   Pulse 102   Ht 160 cm (5' 3)   Wt 84.8 kg (187 lb)   BMI 33.13 kg/m   Wt Readings from Last 3 Encounters:  03/24/23 84.8 kg (187 lb)  12/09/22 88.7 kg (195 lb 9.6 oz)  05/16/22 86.2 kg (190 lb)   General:  Well appearing, no acute distress, appears stated age, cooperative Lymph:  No lymphadenopathy appreciated Neck:  Supple, trachea midline.   Thyroid:  No thyromegaly, no thyroid nodules palpable, moves well with swallowing Cardiac:   Regular rate and rhythm.  No murmurs, rubs or gallops, normal S1 and S2 .  Extremities:   Pulses are 2+ on LE bilaterally.  No edema, cyanosis, or  clubbing.   Hair/Skin/Nails:   Hair of  normal distribution.  Skin is clean, dry, intact, normal texture.  Nails without stippling or onychomycosis.   Neuro: MF intact bilaterally  Psych:   Appropriate affect.  Alert and oriented x 3.  03/2022 HbA1c 7.8% Lab Results  Component Value Date   HGBA1C 8.0 (H) 03/24/2023   HGBA1C 8.8 (H) 12/09/2022   HGBA1C 8.1 (H) 09/01/2022   Lab Results  Component Value Date   MICROALBUR 5.6 09/01/2022   LDLCALC 181 05/14/2019   CREATININE 1.0 09/01/2022   Lab Results  Component Value Date   TSH 1.12 09/01/2022      Assessment:  Tracey Perkins is a very pleasant 37 y.o.female who presented today for management of  type 1 diabetes and chronic lymphocytic thyroiditis   1. Type 1 diabetes mellitus with hyperglycemia (CMS-HCC) and peripheral neuropathy. - I will change the Sick profile  correction factor from 50 to 60  to prevent low glucose from stacking of insulin  correction. I updated control IQ settings to a TDD of 60 instead of 85 and her body weight of 187 intead of 190.  She is very interested in tubeless tandem Sigi or TOBI  when available.   - DM maintenance: UTD. Labs reviewed from 08/2022   The beneficiary Araminta Zorn) requires a therapeutic CGM. The beneficiary Anistyn Graddy) has diabetes mellitus. She uses a continuous subcutaneous insulin  infusion (CSII) pump. The beneficiary's insulin  treatment regimen requires frequent adjustments by the beneficiary on the basis of therapeutic CGM testing results. Naira Standiford has had life threatening hypoglycemia or is at risk for life threatening hypoglycemia. Glucose control is erratic and glycemic control would improve on therapeutic CGM.  The beneficiary has been using a home blood glucose monitor (BGM) and performing frequent (four or more times a day) BGM testing for past 30 days; Within six (6) months prior to ordering the CGM, the treating practitioner has an in-person visit or equivalent telehealth visit given COVID19 pandemic  with the beneficiary to evaluate their diabetes control and determine that the above criteria are met; and, Last Diabetes visit were done at the time of these A1cs: Lab Results  Component Value Date   HGBA1C 8.0 (H) 03/24/2023   HGBA1C 8.8 (H) 12/09/2022   Every six (6) months following the initial prescription of the CGM, the treating practitioner has an in-person visit with the beneficiary to assess adherence to their CGM regimen and diabetes   2. Hypothyroidism   Lab Results  Component Value Date   TSH 1.12 09/01/2022    - Continue levothyroxine  75 mcg daily.    I spent a total of 40 minutes in both face-to-face and non-face-to-face activities, excluding procedures performed, for this visit on the date of this encounter. This time does not include the time spent for CGM interpretation.    Attestation Statement:   I personally performed the service. (TP)  MICHAEL CHARLIE RUNNER, MD

## 2023-04-03 ENCOUNTER — Other Ambulatory Visit: Payer: Self-pay | Admitting: Nurse Practitioner

## 2023-04-03 DIAGNOSIS — R590 Localized enlarged lymph nodes: Secondary | ICD-10-CM

## 2023-04-12 NOTE — Progress Notes (Unsigned)
 BH MD Outpatient Progress Note  02/16/2023 5:42 PM Tracey Perkins  MRN:  [id number]  Assessment:  Tracey Perkins is a 38 y.o. female with a history of anxiety, mood disorder, OSA on CPAP, CVA, type 1 diabetes c/b diabetic gastroparesis and neuropathy, GERD, asthma, hypothyroidism, primary ovarian failure, medulloblastoma s/p chemotherapy and radiotherapy, sensorinueral hearing loss of both ear who presents in person to [facility] for medication management. She was last seen by Dr. Leone Haven here at [facility] on 12/2021 and was on citalopram 5 mg daily.   Patient has had recently increased depression and anxiety since being off citalopram she also cites multiple medical problems that may be stressing her out which is likely contributing to her symptoms of depression and anxiety.  Patient would like to restart citalopram and patient will follow-up in approximately 1 month to evaluate for further adjustments required.  We also discussed starting psychotherapy for which she was amenable and was provided resources for this.  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    Patient and plan of care will be discussed with the Attending MD, Dr. Josephina Shih, who agrees with the above statement and plan.   Subjective:  Chief Complaint: Medication Management   Interval History:  Patient presents virtually.  She reports recently feeling more depressed  and anxious given her multiple medical comorbidities.  She denies SI/HI/AVH.  She reports that mom has also noticed she was more "moody".  She is uncertain whether this is due to discontinuation of Celexa or more of a problem related to recently having more complications related to medical problems.  She would like to restart her Celexa regardless to better aid her with anxiety she is experiencing.  She reports overall sleeping well and eating as best as she can.  Visit Diagnosis:    ICD-10-CM   1. Anxiety  F41.9     2. Mild episode of recurrent major depressive disorder (HCC)  F33.0 citalopram (CELEXA) 10 MG tablet        Past Psychiatric History:  Diagnoses: MDD, GAD  Past Medical History:  Past Medical History:  Diagnosis Date   Asthma    Brain tumor (HCC)    Diabetes mellitus without complication (HCC)    Eczema    Food allergy    Gastroparesis    History of CVA (cerebrovascular accident) 09/03/2020   Hypokalemia    Hypomagnesemia    Hypothyroid    Intractable nausea and vomiting 07/12/2021   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   Mild cognitive impairment 09/04/2020   Neuropathy    Post concussion syndrome 05/2019   Vision changes     Past Surgical History:  Procedure Laterality Date   BRAIN SURGERY     Portacath placement and removed     WISDOM TOOTH EXTRACTION      Family Psychiatric History: Maternal uncle schizophrenia and overdosed on pain pills. Paternal cousin and aunt bipolar   Family History:  Family History  Problem Relation Age of Onset   Thyroid disease  Mother    Sleep apnea Father    Eczema Sister    Thyroid disease Sister    Thyroid disease Sister     Social History:  Academic/Vocational: Gaffer Social History   Socioeconomic History   Marital status: Single    Spouse name: Not on file   Number of children: Not on file   Years of education: Not on file   Highest education level: Not on file  Occupational History   Not on file   Tobacco Use   Smoking status: Never   Smokeless tobacco: Never  Vaping Use   Vaping status: Never Used  Substance and Sexual Activity   Alcohol use: No   Drug use: No   Sexual activity: Yes    Birth control/protection: Pill  Other Topics Concern   Not on file  Social History Narrative   Not on file   Social Drivers of Health   Financial Resource Strain: Low Risk  (09/08/2022)   Received from [facility]   Overall Financial Resource Strain (CARDIA)    Difficulty of Paying Living Expenses: Not hard at all  Food Insecurity: No Food Insecurity (12/18/2022)   Hunger Vital Sign    Worried About Running Out of Food in the Last Year: Never true    Ran Out of Food in the Last Year: Never true  Transportation Needs: No Transportation Needs (12/18/2022)   PRAPARE - Administrator, Civil Service (Medical): No    Lack of Transportation (Non-Medical): No  Physical Activity: Not on file  Stress: Not on file  Social Connections: Not on file    Allergies:  Allergies  Allergen Reactions   Banana Swelling and Other (See Comments)    Mouth burning and swollen   Bactrim [Sulfamethoxazole-Trimethoprim] Hives   Doxycycline Hives   Gentian Violet Other (See Comments)    Mouth burns   Gentian Violet-Proflavine Sulfate [Triple Dye] Other (See Comments)    Mouth burns   Mold Extract [Trichophyton] Other (See Comments)    Hay Fever   Morphine And Codeine Hives   Other Hives, Itching and Other (See Comments)    "Hay fever, dust and pollen."  Per patient.- itching, runny nose   Sulfa Antibiotics    Vancomycin Other (See Comments)    Red man syndrome     Current Medications: Current Outpatient Medications  Medication Sig Dispense Refill   citalopram (CELEXA) 10 MG tablet Take 1 tablet (10 mg total) by mouth daily. 30 tablet 1   acetaminophen (TYLENOL) 500 MG tablet Take 500 mg by mouth as needed for mild pain or headache.     aspirin EC 81 MG tablet Take 81 mg  by mouth daily. Swallow whole.     Continuous Blood Gluc Sensor (DEXCOM G6 SENSOR) MISC Apply 1 Device topically See admin instructions. Place 1 sensor into the skin every 10 days Dexcom 7     Continuous Blood Gluc Transmit (DEXCOM G6 TRANSMITTER) MISC Dexcom 7     DERMOTIC 0.01 % OIL Place 4 drops into both ears See admin instructions. Place 4 drops into both ears 2 times a week as needed/as directed     dicyclomine (BENTYL) 20 MG tablet Take 1 tablet (20 mg total) by mouth 2 (two) times daily as needed for up to 7 days for spasms. 14 tablet 0   EPINEPHRINE 0.3 mg/0.3 mL IJ SOAJ injection Use as directed 2 each 1   FLORASTOR 250 MG capsule Take 250 mg by  mouth in the morning.     fluticasone (FLONASE) 50 MCG/ACT nasal spray 2 sprays per nostril daily as needed for stuffy nose. 16 g 5   fluticasone (FLOVENT HFA) 110 MCG/ACT inhaler Inhale 2 puffs into the lungs 2 (two) times daily as needed (for asthma flares, until cough and wheeze-free). (Patient taking differently: Inhale 2 puffs into the lungs every 30 (thirty) days.)     gabapentin (NEURONTIN) 300 MG capsule Take 300-600 mg by mouth daily. 300 MG in AM and 600 MG in PM     Glucagon 3 MG/DOSE POWD Place 3 mg into the nose as needed (low blood sugar).     insulin aspart (NOVOLOG) 100 UNIT/ML injection Inject into the skin See admin instructions. Up to 190 units a day per pump     LANTUS SOLOSTAR 100 UNIT/ML Solostar Pen as needed (uses onlyl if her insulin pump fails).     levothyroxine (SYNTHROID) 75 MCG tablet Take 75 mcg by mouth daily before breakfast.     loperamide (IMODIUM) 2 MG capsule Take 1 capsule (2 mg total) by mouth 4 (four) times daily as needed for diarrhea or loose stools. 12 capsule 0   loratadine (CLARITIN) 10 MG tablet TAKE 1 TABLET BY MOUTH EVERY DAY AS NEEDED FOR ALLERGY 30 tablet 5   lubiprostone (AMITIZA) 8 MCG capsule Take 16 mcg by mouth daily with breakfast.      magnesium oxide (MAG-OX) 400 (240 Mg) MG tablet Take 800  mg by mouth at bedtime.     meclizine (ANTIVERT) 25 MG tablet as needed.     montelukast (SINGULAIR) 10 MG tablet 1 tablet by mouth at bedtime to prevent coughing or wheezing. 90 tablet 1   Olopatadine HCl 0.6 % SOLN Place 2 sprays into both nostrils 2 (two) times daily. (Patient taking differently: Place 2 sprays into both nostrils 2 (two) times daily as needed.) 30.5 g 5   ondansetron (ZOFRAN) 4 MG tablet Take 1 tablet (4 mg total) by mouth every 4 (four) hours as needed for nausea or vomiting. 12 tablet 0   pantoprazole (PROTONIX) 20 MG tablet Take 1 tablet (20 mg total) by mouth daily. (Patient taking differently: Take 20 mg by mouth as needed.) 30 tablet 5   Polyethyl Glycol-Propyl Glycol (SYSTANE OP) Place 1 drop into both eyes 3 (three) times daily as needed (for dryness).     potassium chloride (KLOR-CON) 10 MEQ tablet Take 10 mEq by mouth daily.     pravastatin (PRAVACHOL) 20 MG tablet Take 20 mg by mouth daily after supper.     PROAIR HFA 108 (90 Base) MCG/ACT inhaler Inhale 2 puffs into the lungs every 4 (four) hours as needed for wheezing or shortness of breath. TAKE 2 PUFFS BY MOUTH EVERY 4 HOURS AS NEEDED Strength: 108 (90 Base) MCG/ACT (Patient taking differently: Inhale 2 puffs into the lungs every 4 (four) hours as needed for wheezing or shortness of breath.) 6.7 g 0   VENTOLIN HFA 108 (90 Base) MCG/ACT inhaler Inhale 2 puffs into the lungs every 4 (four) hours as needed for wheezing or shortness of breath. 8 g 1   No current facility-administered medications for this visit.    ROS: Review of Systems   Objective:  Psychiatric Specialty Exam: There were no vitals taken for this visit.There is no height or weight on file to calculate BMI.  General Appearance: Casual  Eye Contact: Good  Speech:  Clear and Coherent  Volume:  Normal  Mood:  Anxious  and Depressed  Affect:  Appropriate and Congruent  Thought Content: Logical   Suicidal Thoughts:  No  Homicidal Thoughts:  No   Thought Process:  Coherent, Goal Directed, and Linear  Orientation:  Full (Time, Place, and Person)    Memory: Remote;   Fair  Judgment:  Intact  Insight:  Fair  Concentration:  Concentration: Good  Recall: not formally assessed   Fund of Knowledge: Fair  Language: Fair  Psychomotor Activity:  Normal  Akathisia:  No  AIMS (if indicated): not done  Assets:  Communication Skills Desire for Improvement Financial Resources/Insurance Housing Intimacy Leisure Time Resilience Social Support Talents/Skills Transportation Vocational/Educational  ADL's:  Intact  Cognition: WNL  Sleep:  Good   PE: General: well-appearing; no acute distress  Pulm: no increased work of breathing on room air  Strength & Muscle Tone: within normal limits Neuro: no focal neurological deficits observed  Gait & Station: normal  Metabolic Disorder Labs: Lab Results  Component Value Date   HGBA1C 8.8 (H) 07/13/2021   MPG 206 07/13/2021   MPG 197.25 09/03/2020   No results found for: "PROLACTIN" Lab Results  Component Value Date   CHOL 215 (H) 09/03/2020   TRIG 198 (H) 09/03/2020   HDL 40 (L) 09/03/2020   CHOLHDL 5.4 09/03/2020   VLDL 40 09/03/2020   LDLCALC 135 (H) 09/03/2020   Lab Results  Component Value Date   TSH 0.669 07/13/2021    Therapeutic Level Labs: No results found for: "LITHIUM" No results found for: "VALPROATE" No results found for: "CBMZ"  Screenings: GAD-7    Flowsheet Row Video Visit from 09/02/2021 in [facility] Video Visit from 03/11/2021 in [facility] Video Visit from 07/08/2020 in [facility] Video Visit from 04/20/2020 in [facility]  Total GAD-7 Score 6 7 8 7       PHQ2-9    Flowsheet Row Video Visit from 09/02/2021 in [facility] Video Visit from 03/11/2021 in [facility] Video Visit from  07/08/2020 in [facility] Video Visit from 04/20/2020 in [facility] Counselor from 09/17/2019 in [facility]  PHQ-2 Total Score 0 0 1 0 2  PHQ-9 Total Score 2 3 9 6 7       Flowsheet Row ED to Hosp-Admission (Discharged) from 12/18/2022 in [facility] ED from 10/14/2022 in [facility] Department [facility] ED from 03/27/2022 in [facility] Department at [facility]  C-SSRS RISK CATEGORY No Risk No Risk No Risk       Collaboration of Care: Collaboration of Care:  Patient/Guardian was advised Release of Information must be obtained prior to any record release in order to collaborate their care with an outside provider. Patient/Guardian was advised if they have not already done so to contact the registration department to sign all necessary forms in order for [place] to release information regarding their care.   Consent: Patient/Guardian gives verbal consent for treatment and assignment of benefits for services provided during this visit. Patient/Guardian expressed understanding and agreed to proceed.   Televisit via video: I connected with Mosie Epstein on 04/13/23 at  3:00 PM EDT by a video enabled telemedicine application and verified that I am speaking with the correct person using two identifiers.  Location: Patient: home Provider: office   I discussed the limitations of  evaluation and management by telemedicine and the availability of in person appointments. The patient expressed understanding and agreed to proceed.  I discussed the assessment and treatment plan with the patient. The patient was provided an opportunity to ask questions and all were answered. The patient agreed with the plan and demonstrated an understanding of the instructions.   The patient was advised to call back or seek an in-person evaluation if the symptoms  worsen or if the condition fails to improve as anticipated.  Park Pope, MD 04/13/2023, 5:42 PM

## 2023-04-13 ENCOUNTER — Ambulatory Visit: Payer: Medicaid Other | Admitting: Obstetrics and Gynecology

## 2023-04-13 ENCOUNTER — Telehealth (HOSPITAL_COMMUNITY): Payer: Medicaid Other | Admitting: Student

## 2023-04-13 DIAGNOSIS — F419 Anxiety disorder, unspecified: Secondary | ICD-10-CM

## 2023-04-13 DIAGNOSIS — F330 Major depressive disorder, recurrent, mild: Secondary | ICD-10-CM | POA: Diagnosis not present

## 2023-04-13 MED ORDER — CITALOPRAM HYDROBROMIDE 10 MG PO TABS: 10.0000 mg | ORAL_TABLET | Freq: Every day | ORAL | 1 refills | Status: DC

## 2023-04-18 ENCOUNTER — Ambulatory Visit: Payer: Medicaid Other | Admitting: Family Medicine

## 2023-04-21 NOTE — Progress Notes (Signed)
 Prior Authorization Request   Type of Request   [x]   Original (Date submitted: 04/21/2023  []   Re-submit after denial (Date submitted: mm/dd/yyyy)  []   Appeal (Date submitted: mm/dd/yyyy)  []   Second level appeal (Date submitted: mm/dd/yyyy)  Medication Name and Strength: Omnipod 5 DexG7G6 Intro Gen 5 kit               Rx Insurance Information to which PA Submitted:     Rx [employer] Name: CARELON RX Pt ID#: [id number]   []  No PA required, above issue resolved and patient can receive medication  []  Patient notified on (date)  Completed PA Details:  DIAGNOSIS USED TO SUBMIT PA: E10.65  DOCUMENTED MEDICATIONS TRIED/FAILED:   Completed PA authorization submitted via:    [x]    CoverMyMeds website on 04/21/2023 CMM Key:[id number]  Follow up   []   Attempt 1 on mm/dd/yyyy []   Attempt 2 on mm/dd/yyyy []   Attempt 3 on mm/dd/yyyy  PA Status   Status Date: mm/dd/yyyy  For: []   Original []   Re-submit after denial []   Appeal []   Second level appeal                    []   Approved  Start Date:mm/dd/yyyy      End Date: mm/dd/yyyy     Case/Reference ID#:    Co-pay Amount: $    Does patient need PAP referral? []   YES []   NO               [x]   Denied    Case/Reference PI#:[id number]    Denial Reason:     CAROLINE  CLYDELL Medication Access Specialist

## 2023-04-24 NOTE — Patient Instructions (Incomplete)
 Asthma Well-controlled Continue montelukast  10 mg once a day for coughing or wheezing Continue albuterol (ProAir, red inhaler) 2 puffs every 4 hours if needed for wheezing or coughing spells For asthma flares, begin Flovent 110 (orange inhaler)-2 puffs twice a day with a spacer for 2 weeks or until you are cough and wheeze free. Rinse your mouth after each use  Allergic rhinitis Moderately well-controlled Continue Claritin 10 mg once a day if needed for runny nose or itchy eyes.  Continue fluticasone 1 spray per nostril twice a day if needed for stuffy nose. This may help your ears.  In the right nostril, point the applicator out toward the right ear. In the left nostril, point the applicator out toward the left ear Astelin nasal spray 2 sprays in each nostril twice a day as needed for runny nose or sneezing Continue saline nasal rinses as needed for nasal symptoms. Use this before any medicated nasal sprays for best result Continue environmental control of pollens, dust mite, and mold as listed below Consider allergen immunotherapy if medications are not effective in controlling your symptoms  Allergic conjunctivitis Moderately well-controlled Continue a daily lubricating eye drop such as Natural Tears or Systane as needed Some over the counter eye drops include Pataday one drop in each eye once a day as needed for red, itchy eyes OR Zaditor one drop in each eye twice a day as needed for red itchy eyes.  Reflux Moderately well-controlled Restart pantoprazole as previously prescribed Follow up with your Gastroenterologist as you normally do  Adverse food reaction Stable with avoidance Continue to avoid banana. In case of an allergic reaction, take Benadryl 50 mg every 4 hours, and if life-threatening symptoms occur, inject with EpiPen 0.3 mg. Consider skin testing for further evaluation of food allergy. Remember to stop your antihistamine for 3 days before your skin testing  appointment.  Continue other medications as noted in your chart.  Call me if this treatment plan is not working well for you  Follow up in the clinic in 6 months or sooner as needed.  Reducing Pollen Exposure The American Academy of Allergy, Asthma and Immunology suggests the following steps to reduce your exposure to pollen during allergy seasons. Do not hang sheets or clothing out to dry; pollen may collect on these items. Do not mow lawns or spend time around freshly cut grass; mowing stirs up pollen. Keep windows closed at night.  Keep car windows closed while driving. Minimize morning activities outdoors, a time when pollen counts are usually at their highest. Stay indoors as much as possible when pollen counts or humidity is high and on windy days when pollen tends to remain in the air longer. Use air conditioning when possible.  Many air conditioners have filters that trap the pollen spores. Use a HEPA room air filter to remove pollen form the indoor air you breathe.   Control of Dust Mite Allergen Dust mites play a major role in allergic asthma and rhinitis. They occur in environments with high humidity wherever human skin is found. Dust mites absorb humidity from the atmosphere (ie, they do not drink) and feed on organic matter (including shed human and animal skin). Dust mites are a microscopic type of insect that you cannot see with the naked eye. High levels of dust mites have been detected from mattresses, pillows, carpets, upholstered furniture, bed covers, clothes, soft toys and any woven material. The principal allergen of the dust mite is found in its feces. A gram  of dust may contain 1,000 mites and 250,000 fecal particles. Mite antigen is easily measured in the air during house cleaning activities. Dust mites do not bite and do not cause harm to humans, other than by triggering allergies/asthma.  Ways to decrease your exposure to dust mites in your home:  1. Encase  mattresses, box springs and pillows with a mite-impermeable barrier or cover  2. Wash sheets, blankets and drapes weekly in hot water (130 F) with detergent and dry them in a dryer on the hot setting.  3. Have the room cleaned frequently with a vacuum cleaner and a damp dust-mop. For carpeting or rugs, vacuuming with a vacuum cleaner equipped with a high-efficiency particulate air (HEPA) filter. The dust mite allergic individual should not be in a room which is being cleaned and should wait 1 hour after cleaning before going into the room.  4. Do not sleep on upholstered furniture (eg, couches).  5. If possible removing carpeting, upholstered furniture and drapery from the home is ideal. Horizontal blinds should be eliminated in the rooms where the person spends the most time (bedroom, study, television room). Washable vinyl, roller-type shades are optimal.  6. Remove all non-washable stuffed toys from the bedroom. Wash stuffed toys weekly like sheets and blankets above.  7. Reduce indoor humidity to less than 50%. Inexpensive humidity monitors can be purchased at most hardware stores. Do not use a humidifier as can make the problem worse and are not recommended.  Control of Mold Allergen Mold and fungi can grow on a variety of surfaces provided certain temperature and moisture conditions exist.  Outdoor molds grow on plants, decaying vegetation and soil.  The major outdoor mold, Alternaria and Cladosporium, are found in very high numbers during hot and dry conditions.  Generally, a late Summer - Fall peak is seen for common outdoor fungal spores.  Rain will temporarily lower outdoor mold spore count, but counts rise rapidly when the rainy period ends.  The most important indoor molds are Aspergillus and Penicillium.  Dark, humid and poorly ventilated basements are ideal sites for mold growth.  The next most common sites of mold growth are the bathroom and the kitchen.  Outdoor [employer] Use  air conditioning and keep windows closed Avoid exposure to decaying vegetation. Avoid leaf raking. Avoid grain handling. Consider wearing a face mask if working in moldy areas.  Indoor Mold Control Maintain humidity below 50%. Clean washable surfaces with 5% bleach solution. Remove sources e.g. Contaminated carpets.

## 2023-04-24 NOTE — Progress Notes (Unsigned)
   [address] [place] [address] Dept: [phone]  FOLLOW UP NOTE  Patient ID: Tracey Perkins, female    DOB: [birth year]  Age: 38 y.o. MRN: [id number] Date of Office Visit: 04/25/2023  Assessment  Chief Complaint: No chief complaint on file.  HPI Tracey Perkins is a 38 year old female who presents to the clinic for follow-up visit.  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  Her last food allergy lab work on 10/18/2022 indicated an IgE 0.36.  Her last environmental allergy testing was on 11/16/2015 was positive to pollens, dust mite, and mold. Discussed the use of AI scribe software for clinical note transcription with the patient, who gave verbal consent to proceed.  History of Present Illness      Drug Allergies:  Allergies  Allergen Reactions   Banana Swelling and Other (See Comments)    Mouth burning and swollen   Bactrim [Sulfamethoxazole-Trimethoprim] Hives   Doxycycline Hives   Gentian Violet Other (See Comments)    Mouth burns   Gentian Violet-Proflavine Sulfate [Triple Dye] Other (See Comments)    Mouth burns   Mold Extract [Trichophyton] Other (See Comments)    Hay Fever   Morphine And Codeine Hives   Other Hives, Itching and Other (See Comments)    "Hay fever, dust and pollen."  Per patient.- itching, runny nose   Sulfa Antibiotics    Vancomycin Other (See Comments)    Red man syndrome     Physical Exam: There were no vitals taken for this visit.   Physical Exam  Diagnostics:    Assessment and Plan: No diagnosis found.  No orders of the defined types were placed in this encounter.   There are no Patient Instructions on file for this visit.  No follow-ups on file.    Thank you for the opportunity to care for this patient.  Please do not hesitate to contact me with questions.  Thermon Leyland, FNP [facility]

## 2023-04-25 ENCOUNTER — Encounter: Payer: Self-pay | Admitting: Family Medicine

## 2023-04-25 ENCOUNTER — Other Ambulatory Visit: Payer: Self-pay

## 2023-04-25 ENCOUNTER — Ambulatory Visit: Admitting: Family Medicine

## 2023-04-25 VITALS — BP 116/88 | HR 109 | Temp 97.8°F | Resp 18 | Wt 189.0 lb

## 2023-04-25 DIAGNOSIS — H1013 Acute atopic conjunctivitis, bilateral: Secondary | ICD-10-CM

## 2023-04-25 DIAGNOSIS — T7800XA Anaphylactic reaction due to unspecified food, initial encounter: Secondary | ICD-10-CM

## 2023-04-25 DIAGNOSIS — J302 Other seasonal allergic rhinitis: Secondary | ICD-10-CM

## 2023-04-25 DIAGNOSIS — K219 Gastro-esophageal reflux disease without esophagitis: Secondary | ICD-10-CM | POA: Diagnosis not present

## 2023-04-25 DIAGNOSIS — J3089 Other allergic rhinitis: Secondary | ICD-10-CM | POA: Diagnosis not present

## 2023-04-25 DIAGNOSIS — J4530 Mild persistent asthma, uncomplicated: Secondary | ICD-10-CM

## 2023-04-25 DIAGNOSIS — E109 Type 1 diabetes mellitus without complications: Secondary | ICD-10-CM

## 2023-04-25 DIAGNOSIS — T7800XD Anaphylactic reaction due to unspecified food, subsequent encounter: Secondary | ICD-10-CM

## 2023-04-25 DIAGNOSIS — H1010 Acute atopic conjunctivitis, unspecified eye: Secondary | ICD-10-CM

## 2023-04-25 MED ORDER — EPINEPHRINE 0.3 MG/0.3ML IJ SOAJ: INTRAMUSCULAR | 1 refills | Status: AC

## 2023-04-25 MED ORDER — PROAIR HFA 108 (90 BASE) MCG/ACT IN AERS: 2.0000 | INHALATION_SPRAY | RESPIRATORY_TRACT | 0 refills | Status: DC | PRN

## 2023-04-25 MED ORDER — OLOPATADINE HCL 0.6 % NA SOLN: 2.0000 | Freq: Two times a day (BID) | NASAL | 5 refills | Status: AC

## 2023-04-25 MED ORDER — MONTELUKAST SODIUM 10 MG PO TABS: ORAL_TABLET | ORAL | 1 refills | Status: DC

## 2023-04-25 MED ORDER — VENTOLIN HFA 108 (90 BASE) MCG/ACT IN AERS: 2.0000 | INHALATION_SPRAY | RESPIRATORY_TRACT | 1 refills | Status: AC | PRN

## 2023-04-25 MED ORDER — PROAIR HFA 108 (90 BASE) MCG/ACT IN AERS: 2.0000 | INHALATION_SPRAY | RESPIRATORY_TRACT | 0 refills | Status: AC | PRN

## 2023-04-25 MED ORDER — LORATADINE 10 MG PO TABS: 10.0000 mg | ORAL_TABLET | Freq: Every day | ORAL | 5 refills | Status: AC | PRN

## 2023-04-26 NOTE — Progress Notes (Signed)
 Chief Complaint  Patient presents with  . Skin Check    Full body skin check   . Follow-up    Annual skin check     HX:  Tracey Perkins is a 38 y.o. female who presents for skin check.  History of Present Illness The patient presents for a skin check. She is accompanied by her mother.  She reports no new or concerning skin changes since her last visit approximately a year ago. Mother has a history of basal cell carcinoma.  She has a history of radiation damage, which continues to manifest as skin peeling inside her ear, accompanied by itching. She uses hearing aids throughout the day.  She has multiple moles, some of which have a darker center.   Additionally, she has 2 skin tags on her neck and is interested in their removal.  FAMILY HISTORY Her grandmother was diagnosed with melanoma last summer.  Patient denies any other new/changing/growing lesions or non-healing/ulcerated lesions. No other concerns today.   Method of sun protection: sunscreen and sun protective clothing  Personal history of skin cancer: None Family history of melanoma: maternal grandmother  Family history of other skin cancer: mom BCC   Medications and allergies updated and reviewed.   ROS negative for other lumps, bumps or dermatologic concerns. No fever/chills.   OBJECTIVE: On physical exam, patient is a 38 y.o. female who appears alert and oriented, in no apparent distress, with appropriate mood and affect.   Full body exam was performed today via inspection and palpation of the hair, scalp, face, upper and lower eyelids, neck, chest, abdomen, back and bilateral upper and lower extremities. The areas covered by the patient's undergarments were not examined today. The patient does not note any areas of concern underneath their undergarments today.  Exam findings include the following:  Diffuse sun damage in sun exposed areas with solar lentigines Multiple tan and brown nevi  inconsistent with ABCDE criteria for melanoma.  Hyperkeratotic papules & small plaques in various shades of brown consistent with seborrheic keratoses on the trunk. Scattered bright red, smooth, well-circumscribed papules consistent with cherry angioma on the trunk and b/l extremities. Erythematous rough textured skin with punctate appearance along the posterior b/l arms consistent with keratosis pilaris.  Mild scaling without erythema in b/l ears Flesh colored to brown pedunculated papules located on the neck and trunk consistent with skin tags.    ASSESSMENT: 1. Dermatitis of ear canal, bilateral  fluocinolone acetonide oiL (DermOtic Oil) 0.01 % drop    2. Multiple benign nevi      3. Cherry angioma      4. Seborrheic keratosis      5. Keratosis pilaris      6. Solar lentiginosis      7. Skin cancer screening      8. Skin tag         PLAN: The above diagnosis and treatment options were reviewed with the patient. Educated the patient about the benign nature of some of the lesions noted on today's exam. Procedures Performed Today:  None Medications Prescribed Today:  Orders Placed This Encounter  Medications  . fluocinolone acetonide oiL (DermOtic Oil) 0.01 % drop    Sig: Apply drops into the ears once daily as needed for scaling and itching.    Dispense:  20 mL    Refill:  3    Discussed the importance of consistent daily sunscreen use with SPF30 or higher, wearing a hat outdoors, and UV clothing  for protection against further UV damage.  Educated on the ABCDEs of melanoma and advised regular self-skin exams to look for any changing lesions that appear suspicious.      The patient was encouraged to call or send a message through [facility] for any questions or concerns. Return in about 1 year (around 04/25/2024) for skin check. - but sooner as needed   Electronically signed by:  Wyline Terrea Gentry, PA-C, 04/26/2023 1:18 PM

## 2023-04-27 ENCOUNTER — Other Ambulatory Visit

## 2023-04-28 ENCOUNTER — Encounter: Payer: Self-pay | Admitting: Obstetrics and Gynecology

## 2023-04-28 ENCOUNTER — Ambulatory Visit: Admitting: Obstetrics and Gynecology

## 2023-04-28 VITALS — BP 110/79 | HR 104 | Ht 63.0 in | Wt 188.0 lb

## 2023-04-28 DIAGNOSIS — R232 Flushing: Secondary | ICD-10-CM

## 2023-04-28 DIAGNOSIS — Z7689 Persons encountering health services in other specified circumstances: Secondary | ICD-10-CM | POA: Diagnosis not present

## 2023-04-28 NOTE — Progress Notes (Signed)
    GYNECOLOGY PROGRESS NOTE  History:  38 y.o. G0P0000 presents to [facility] to establish care. She has a history of brain tumor, reports had chemo and has early menopause, LMP 2022. She was on birth control pills until 2022 when she developed a stroke. S/p stroke in 2022. She reports having a pap smear in 2023 that was normal. No hx abnormal. She does endorse hot flashes. Reports they are not as often, maybe a few weeks apart but when she experiences they are intense. She is not currently working, but might be interested in medication to help with the hot flashes so they do not disrupt her work. She has T1DM and has had recurrent yeast in the past, she uses diflucan and been on diflucan once weekly previously. No current symptoms.  Last pap 2024. WNL Done thru [facility] [place].Has chronic yeast infections, No problems today.     The following portions of the patient's history were reviewed and updated as appropriate: allergies, current medications, past family history, past medical history, past social history, past surgical history and problem list. Last pap smear reports 2024 was normal  Care everywhere 10/14/19 pap NILM  Health Maintenance Due  Topic Date Due   OPHTHALMOLOGY EXAM  Never done   Diabetic kidney evaluation - Urine ACR  Never done   Hepatitis C Screening  Never done   DTaP/Tdap/Td (1 - Tdap) Never done   Pneumococcal Vaccine 11-24 Years old (1 of 2 - PCV) Never done   COVID-19 Vaccine (3 - Moderna risk series) 06/13/2019   HEMOGLOBIN A1C  01/12/2022   Cervical Cancer Screening (HPV/Pap Cotest)  10/14/2022   FOOT EXAM  03/11/2023     Review of Systems:  Pertinent items are noted in HPI.   Objective:  Physical Exam Blood pressure 110/79, pulse (!) 104, height 5\' 3"  (1.6 m), weight 188 lb (85.3 kg), last menstrual period 08/17/2020. VS reviewed, nursing note reviewed,  Constitutional: well developed, well nourished, no distress HEENT: normocephalic Pulm/chest wall:  normal effort Breast Exam: deferred Abdomen: soft Neuro: alert and oriented  Skin: warm, dry Psych: affect normal Pelvic exam: declined  Assessment & Plan:  1. Encounter to establish care (Primary) UTD on pap, does not desires pelvic or breast exam today  Follows pcp and other specialties  Discussed chronic yeast infections, reports she is on suppression, follow up as needed   2. Hot flashes We discussed different options for treatments to include SSRI, gabapentin , hormone, Veozah - she reports that she already takes celexa  and gabapentin . She is going to consider Veozah, discussed checking liver function prior to initiation. She wants to be able to manage hot flashes when she starts back to work, information provided today , will notify if desires to start, or other information   Return in about 1 year (around 04/27/2024) for Zoanne Hinders, FNP

## 2023-04-28 NOTE — Progress Notes (Signed)
 Last pap 2024. WNL Done thru [facility] [place].Has chronic yeast infections, No problems today.

## 2023-04-29 ENCOUNTER — Other Ambulatory Visit: Payer: Self-pay | Admitting: Family Medicine

## 2023-05-04 ENCOUNTER — Ambulatory Visit
Admission: RE | Admit: 2023-05-04 | Discharge: 2023-05-04 | Disposition: A | Discharge status: Home / Self Care | Source: Ambulatory Visit | Attending: Nurse Practitioner

## 2023-05-04 DIAGNOSIS — R590 Localized enlarged lymph nodes: Secondary | ICD-10-CM

## 2023-05-04 MED ORDER — IOPAMIDOL (ISOVUE-300) INJECTION 61%
75.0000 mL | Freq: Once | INTRAVENOUS | Status: AC | PRN
  Administered 2023-05-04: 75 mL via INTRAVENOUS

## 2023-06-12 NOTE — Progress Notes (Unsigned)
 [facility] MD Outpatient Progress Note  02/16/2023 5:04 PM Tracey Perkins  MRN:  [id number]  Assessment:  Tracey Perkins is a 38 y.o. female with a history of anxiety, mood disorder, OSA on CPAP, CVA, type 1 diabetes c/b diabetic gastroparesis and neuropathy, GERD, asthma, hypothyroidism, primary ovarian failure, medulloblastoma s/p chemotherapy and radiotherapy, sensorinueral hearing loss of both ear who presents in person to [facility] for medication management. She was last seen by Dr. Ethel Henry here at [facility] on 12/2021 and was on citalopram  5 mg daily.   Patient has had recently increased depression and anxiety since being off citalopram  she also cites multiple medical problems that may be stressing her out which is likely contributing to her symptoms of depression and anxiety.  Patient would like to restart citalopram  and patient will follow-up in approximately 1 month to evaluate for further adjustments required.  We also discussed starting psychotherapy for which she was amenable and was provided resources for this.  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    Patient and plan of care will be discussed with the Attending MD, Dr. Eligio Grumbling, who agrees with the above statement and plan.   Subjective:  Chief Complaint: Medication Management   Interval History:  Patient presents virtually.  She reports recently feeling more depressed  and anxious given her multiple medical comorbidities.  She denies SI/HI/AVH.  She reports that mom has also noticed she was more "moody".  She is uncertain whether this is due to discontinuation of Celexa  or more of a problem related to recently having more complications related to medical problems.  She would like to restart her Celexa  regardless to better aid her with anxiety she is experiencing.  She reports overall sleeping well and eating as best as she can.  Visit Diagnosis:  No diagnosis found.     Past Psychiatric History:  Diagnoses: MDD, GAD  Past Medical History:  Past Medical History:  Diagnosis Date   Asthma    Brain tumor (HCC)    Diabetes mellitus without complication (HCC)    Eczema    Food allergy    Gastroparesis    History of CVA (cerebrovascular accident) 09/03/2020   Hypokalemia    Hypomagnesemia    Hypothyroid    Intractable nausea and vomiting 07/12/2021   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   Mild cognitive impairment 09/04/2020   Neuropathy    Post concussion syndrome 05/2019   Vision changes     Past Surgical History:  Procedure Laterality Date   BRAIN SURGERY     Portacath placement and removed     WISDOM TOOTH EXTRACTION      Family Psychiatric History: Maternal uncle schizophrenia and overdosed on pain pills. Paternal cousin and aunt bipolar   Family History:  Family History  Problem Relation Age of Onset   Thyroid disease Mother    Sleep apnea Father    Eczema Sister    Thyroid disease Sister    Thyroid disease Sister     Social  History:  Academic/Vocational: graduate student Social History   Socioeconomic History   Marital status: Single    Spouse name: Not on file   Number of children: Not on file   Years of education: Not on file   Highest education level: Not on file  Occupational History   Not on file  Tobacco Use   Smoking status: Never   Smokeless tobacco: Never  Vaping Use   Vaping status: Never Used  Substance and  Sexual Activity   Alcohol  use: No   Drug use: No   Sexual activity: Not Currently  Other Topics Concern   Not on file  Social History Narrative   Not on file   Social Drivers of Health   Financial Resource Strain: Low Risk  (09/08/2022)   Received from [facility]   Overall Financial Resource Strain (CARDIA)    Difficulty of Paying Living Expenses: Not hard at all  Food Insecurity: No Food Insecurity (12/18/2022)   Hunger Vital Sign    Worried About Running Out of Food in the Last Year: Never true    Ran Out of Food in the Last Year: Never true  Transportation Needs: No Transportation Needs (12/18/2022)   PRAPARE - Administrator, Civil Service (Medical): No    Lack of Transportation (Non-Medical): No  Physical Activity: Not on file  Stress: Not on file  Social Connections: Not on file    Allergies:  Allergies  Allergen Reactions   Banana Swelling and Other (See Comments)    Mouth burning and swollen   Bactrim [Sulfamethoxazole-Trimethoprim] Hives   Doxycycline Hives   Gentian Violet Other (See Comments)    Mouth burns   Gentian Violet-Proflavine Sulfate [Triple Dye] Other (See Comments)    Mouth burns   Mold Extract [Trichophyton] Other (See Comments)    Hay Fever   Morphine And Codeine Hives   Other Hives, Itching and Other (See Comments)    "Hay fever, dust and pollen."  Per patient.- itching, runny nose   Sulfa Antibiotics    Vancomycin  Other (See Comments)    Red man syndrome     Current Medications: Current Outpatient Medications  Medication Sig Dispense Refill   acetaminophen  (TYLENOL ) 500 MG tablet Take 500 mg by mouth as needed for mild pain or headache.     aspirin  EC 81 MG tablet Take 81 mg by mouth daily. Swallow whole.     citalopram  (CELEXA ) 10 MG tablet Take 1 tablet (10 mg total) by mouth daily. 30 tablet 1   Continuous Blood Gluc Sensor (DEXCOM G6 SENSOR) MISC Apply 1 Device topically See admin instructions. Place 1  sensor into the skin every 10 days Dexcom 7     Continuous Blood Gluc Transmit (DEXCOM G6 TRANSMITTER) MISC Dexcom 7     DERMOTIC 0.01 % OIL Place 4 drops into both ears See admin instructions. Place 4 drops into both ears 2 times a week as needed/as directed     dicyclomine  (BENTYL ) 20 MG tablet Take 1 tablet (20 mg total) by mouth 2 (two) times daily as needed for up to 7 days for spasms. 14 tablet 0   EPINEPHRINE  0.3 mg/0.3 mL IJ SOAJ injection Use as directed 2 each 1   FLORASTOR 250 MG capsule Take 250 mg by mouth in the morning.     fluticasone  (FLONASE ) 50 MCG/ACT nasal spray 2 SPRAYS PER NOSTRIL DAILY AS NEEDED FOR STUFFY NOSE. 48 mL 1   fluticasone  (FLOVENT  HFA) 110 MCG/ACT  inhaler Inhale 2 puffs into the lungs 2 (two) times daily as needed (for asthma flares, until cough and wheeze-free). (Patient taking differently: Inhale 2 puffs into the lungs every 30 (thirty) days.)     Glucagon  3 MG/DOSE POWD Place 3 mg into the nose as needed (low blood sugar).     insulin  aspart (NOVOLOG ) 100 UNIT/ML injection Inject into the skin See admin instructions. Up to 190 units a day per pump     LANTUS SOLOSTAR 100 UNIT/ML Solostar Pen as needed (uses onlyl if her insulin  pump fails).     levothyroxine  (SYNTHROID ) 75 MCG tablet Take 75 mcg by mouth daily before breakfast.     loperamide  (IMODIUM ) 2 MG capsule Take 1 capsule (2 mg total) by mouth 4 (four) times daily as needed for diarrhea or loose stools. 12 capsule 0   loratadine  (CLARITIN ) 10 MG tablet Take 1 tablet (10 mg total) by mouth daily as needed for allergies. 30 tablet 5   lubiprostone  (AMITIZA ) 8 MCG capsule Take 16 mcg by mouth daily with breakfast.      magnesium  oxide (MAG-OX) 400 (240 Mg) MG tablet Take 800 mg by mouth at bedtime.     meclizine  (ANTIVERT ) 25 MG tablet as needed. (Patient not taking: Reported on 04/28/2023)     montelukast  (SINGULAIR ) 10 MG tablet 1 tablet by mouth at bedtime to prevent coughing or wheezing. 90 tablet 1    Olopatadine  HCl 0.6 % SOLN Place 2 sprays into both nostrils 2 (two) times daily. (Patient not taking: Reported on 04/28/2023) 30.5 g 5   ondansetron  (ZOFRAN ) 4 MG tablet Take 1 tablet (4 mg total) by mouth every 4 (four) hours as needed for nausea or vomiting. 12 tablet 0   pantoprazole  (PROTONIX ) 20 MG tablet Take 1 tablet (20 mg total) by mouth daily. (Patient taking differently: Take 20 mg by mouth as needed.) 30 tablet 5   Polyethyl Glycol-Propyl Glycol (SYSTANE OP) Place 1 drop into both eyes 3 (three) times daily as needed (for dryness).     potassium chloride  (KLOR-CON ) 10 MEQ tablet Take 10 mEq by mouth daily.     pravastatin  (PRAVACHOL ) 20 MG tablet Take 20 mg by mouth daily after supper.     PROAIR  HFA 108 (90 Base) MCG/ACT inhaler Inhale 2 puffs into the lungs every 4 (four) hours as needed for wheezing or shortness of breath. 6.7 g 0   VENTOLIN  HFA 108 (90 Base) MCG/ACT inhaler Inhale 2 puffs into the lungs every 4 (four) hours as needed for wheezing or shortness of breath. 8 g 1   No current facility-administered medications for this visit.    ROS: Review of Systems   Objective:  Psychiatric Specialty Exam: There were no vitals taken for this visit.There is no height or weight on file to calculate BMI.  General Appearance: Casual  Eye Contact: Good  Speech:  Clear and Coherent  Volume:  Normal  Mood:  Anxious and Depressed  Affect:  Appropriate and Congruent  Thought Content: Logical   Suicidal Thoughts:  No  Homicidal Thoughts:  No  Thought Process:  Coherent, Goal Directed, and Linear  Orientation:  Full (Time, Place, and Person)    Memory: Remote;   Fair  Judgment:  Intact  Insight:  Fair  Concentration:  Concentration: Good  Recall: not formally assessed   Fund of Knowledge: Fair  Language: Fair  Psychomotor Activity:  Normal  Akathisia:  No  AIMS (if indicated): not done  Assets:  Manufacturing systems engineer  Desire for Improvement Financial  Resources/Insurance Housing Intimacy Leisure Time Resilience Social Support Talents/Skills Transportation Vocational/Educational  ADL's:  Intact  Cognition: WNL  Sleep:  Good   PE: General: well-appearing; no acute distress  Pulm: no increased work of breathing on room air  Strength & Muscle Tone: within normal limits Neuro: no focal neurological deficits observed  Gait & Station: normal  Metabolic Disorder Labs: Lab Results  Component Value Date   HGBA1C 8.8 (H) 07/13/2021   MPG 206 07/13/2021   MPG 197.25 09/03/2020   No results found for: "PROLACTIN" Lab Results  Component Value Date   CHOL 215 (H) 09/03/2020   TRIG 198 (H) 09/03/2020   HDL 40 (L) 09/03/2020   CHOLHDL 5.4 09/03/2020   VLDL 40 09/03/2020   LDLCALC 135 (H) 09/03/2020   Lab Results  Component Value Date   TSH 0.669 07/13/2021    Therapeutic Level Labs: No results found for: "LITHIUM" No results found for: "VALPROATE" No results found for: "CBMZ"  Screenings: GAD-7    Flowsheet Row Office Visit from 04/28/2023 in [facility] for [facility] [place] Video Visit from 09/02/2021 in [facility] Video Visit from 03/11/2021 in [facility] Video Visit from 07/08/2020 in [facility] Video Visit from 04/20/2020 in [facility]  Total GAD-7 Score 2 6 7 8 7       PHQ2-9    Flowsheet Row Office Visit from 04/28/2023 in [facility] for [facility] [place] Video Visit from 09/02/2021 in [facility] Video Visit from 03/11/2021 in [facility] Video Visit from 07/08/2020 in [facility] Video Visit from 04/20/2020 in [facility]  PHQ-2 Total Score 0 0 0 1 0  PHQ-9 Total Score 0 2 3 9 6       Flowsheet Row ED to Hosp-Admission (Discharged) from 12/18/2022 in  [place] [facility] ED from 10/14/2022 in [facility] ED from 03/27/2022 in [facility] Emergency Department at [facility]  C-SSRS RISK CATEGORY No Risk No Risk No Risk       Collaboration of Care: Collaboration of Care:  Patient/Guardian was advised Release of Information must be obtained prior to any record release in order to collaborate their care with an outside provider. Patient/Guardian was advised if they have not already done so to contact the registration department to sign all necessary forms in order for us  to release information regarding their care.   Consent: Patient/Guardian gives verbal consent for treatment and assignment of benefits for services provided during this visit. Patient/Guardian expressed understanding and agreed to proceed.   Televisit via video: I connected with Tracey Perkins on 06/12/23 at 10:30 AM EDT by a video enabled telemedicine application and verified that I am speaking with the correct person using two identifiers.  Location: Patient: home Provider: office   I discussed the limitations of evaluation and management by telemedicine and the availability of in person appointments. The patient expressed understanding and agreed to proceed.  I discussed the assessment and treatment plan with the patient. The patient was provided an opportunity to ask questions and all were answered. The patient agreed with the plan and demonstrated an understanding of the instructions.   The patient was advised to call back or seek an in-person evaluation if the symptoms worsen or if the  condition fails to improve as anticipated.  Augusta Blizzard, MD 06/12/2023, 5:04 PM

## 2023-06-13 ENCOUNTER — Encounter (HOSPITAL_COMMUNITY): Payer: Self-pay | Admitting: Student

## 2023-06-13 ENCOUNTER — Telehealth (HOSPITAL_COMMUNITY): Admitting: Student

## 2023-06-13 DIAGNOSIS — F330 Major depressive disorder, recurrent, mild: Secondary | ICD-10-CM

## 2023-06-13 MED ORDER — CITALOPRAM HYDROBROMIDE 10 MG PO TABS
10.0000 mg | ORAL_TABLET | Freq: Every day | ORAL | 0 refills | Status: DC
Start: 2023-06-13 — End: 2023-09-05
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
## 2023-07-25 ENCOUNTER — Encounter (HOSPITAL_COMMUNITY): Admitting: Psychiatry

## 2023-07-31 ENCOUNTER — Ambulatory Visit: Admitting: Physical Therapy

## 2023-08-08 ENCOUNTER — Other Ambulatory Visit: Payer: Self-pay

## 2023-08-08 ENCOUNTER — Ambulatory Visit: Attending: Family Medicine | Admitting: Physical Therapy

## 2023-08-08 ENCOUNTER — Encounter: Payer: Self-pay | Admitting: Physical Therapy

## 2023-08-08 DIAGNOSIS — R2689 Other abnormalities of gait and mobility: Secondary | ICD-10-CM | POA: Diagnosis present

## 2023-08-08 DIAGNOSIS — R2681 Unsteadiness on feet: Secondary | ICD-10-CM | POA: Insufficient documentation

## 2023-08-08 DIAGNOSIS — R296 Repeated falls: Secondary | ICD-10-CM | POA: Insufficient documentation

## 2023-08-08 DIAGNOSIS — M6281 Muscle weakness (generalized): Secondary | ICD-10-CM | POA: Diagnosis present

## 2023-08-08 DIAGNOSIS — R262 Difficulty in walking, not elsewhere classified: Secondary | ICD-10-CM | POA: Insufficient documentation

## 2023-08-08 DIAGNOSIS — M25562 Pain in left knee: Secondary | ICD-10-CM | POA: Insufficient documentation

## 2023-08-08 NOTE — Therapy (Signed)
 OUTPATIENT PHYSICAL THERAPY NEURO EVALUATION   Patient Name: Tracey Perkins MRN: [id number] DOB:[birth year], 38 y.o., female Today's Date: 08/08/2023   PCP: Tracey Santos, MD REFERRING PROVIDER: Irving Delinda ORN, MD  END OF SESSION:  PT End of Session - 08/08/23 1218     Visit Number 1    Number of Visits 13    Date for PT Re-Evaluation 09/15/23    Authorization Type [place] [employer]    PT Start Time 1228    PT Stop Time 1313    PT Time Calculation (min) 45 min    Equipment Utilized During Treatment Gait belt    Activity Tolerance Patient tolerated treatment well    Behavior During Therapy WFL for tasks assessed/performed          Past Medical History:  Diagnosis Date   Asthma    Brain tumor (HCC)    Diabetes mellitus without complication (HCC)    Eczema    Food allergy    Gastroparesis    History of CVA (cerebrovascular accident) 09/03/2020   Hypokalemia    Hypomagnesemia    Hypothyroid    Intractable nausea and vomiting 07/12/2021   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   Mild cognitive impairment 09/04/2020   Neuropathy    Post concussion syndrome 05/2019   Vision changes    Past Surgical History:  Procedure Laterality Date   BRAIN SURGERY     Portacath placement and removed     WISDOM TOOTH EXTRACTION     Patient Active Problem List   Diagnosis Date Noted   Acute respiratory failure with hypoxia (HCC) 12/18/2022   Class 1 obesity 12/18/2022   Multifocal pneumonia 12/18/2022   Hyperlipidemia 12/18/2022   Colitis 12/22/2021   Tachycardia 07/12/2021   Intractable nausea and vomiting 07/12/2021   Diarrhea 07/12/2021   Diabetic neuropathy (HCC) 07/12/2021   Hypothyroidism 07/12/2021   CVA (cerebral vascular accident) (HCC) 09/16/2020   Mild cognitive impairment 09/04/2020   Diabetic gastroparesis (HCC) 09/04/2020   History of CVA (cerebrovascular accident) 09/03/2020   Diabetic polyneuropathy associated with type 1 diabetes mellitus (HCC)  05/15/2020   Post concussive syndrome 05/15/2020   Insulin  pump status 04/02/2020   Impulsiveness 10/14/2019   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   Mood disorder (HCC) 08/27/2019   Anxiety 08/16/2019   Polyneuropathy 06/19/2019   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   Status post radiation therapy 11/22/2018   Seasonal and perennial allergic rhinitis 11/22/2018   Seasonal allergic conjunctivitis 11/22/2018   Acute sinusitis 11/22/2018   Keratosis pilaris 10/05/2017   Melanocytic nevi of trunk 10/05/2017   Adverse food reaction 02/09/2017   Gastroesophageal reflux disease 02/09/2017   Mild intermittent asthma with acute exacerbation 11/19/2015   Mild persistent asthma without complication 11/16/2015   Acute seasonal allergic rhinitis due to pollen 11/16/2015   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   Type 1 diabetes (HCC) 11/16/2015   Dry mouth 11/16/2015   Xerosis cutis 11/16/2015   Nystagmus 08/15/2014   Visual field defect 08/15/2014   Disequilibrium 07/25/2014   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  Several weeks ago, fell off a stool and went down on L knee.  Scraped and bruised it.  They x-rayed it and  said no fracture, no ligament damage, just a bad bruise.  Wore a knee brace last week while at camp and it helped.  The popping sensation happens sometimes in the knee with excess bending. Pt accompanied by: family member, mom  PERTINENT HISTORY: medical history significant of asthma, brain tumor as a child medulloblastoma, history of TBI as an adult, history of CVA, mild cognitive impairment, postconcussion syndrome, vision changes, type 1 diabetes, diabetic gastroparesis, intractable nausea and vomiting, diabetic peripheral neuropathy, eczema, seasonal allergies, food allergy,hypothyroidism, OSA on CPAP, neuropathy pain  PAIN:  Are you having pain? Occasional pain in L knee/knee cap, worse with bending the knee  PRECAUTIONS: Fall and Other: HOH    Vision/depth perception problems RED FLAGS: None   WEIGHT BEARING RESTRICTIONS: No  FALLS: Has patient fallen in last 6 months? Yes. Number of falls multiple  LIVING ENVIRONMENT: Lives with: lives with their family Lives in: House/apartment Stairs: Yes: Internal: flight steps; rails Has following equipment at home: [name], Wheelchair (manual), and no walker in the house  PLOF: independent with device; enjoys writing poetry and short stories; likes walking around [employer] and some trails  PATIENT GOALS: To help with my balance, to help my knee feel better  OBJECTIVE:  Note: Objective measures were completed at Evaluation unless otherwise noted.  DIAGNOSTIC FINDINGS: per pt/mom-x-rays negative  COGNITION: Overall cognitive status: History of cognitive impairments - at baseline and pt reports slower processing   SENSATION: Light touch: WFL  COORDINATION: Slowed alternating taps  PALPATION:  Tender to palpation L > R knee at Lateral, posterior aspect of patella  Negative for ligamentous laxity or pain in ant/post or lateral directions  Decreased patellar motion L knee  LOWER EXTREMITY ROM:     Active   Right Eval Left Eval  Hip flexion    Hip extension    Hip abduction    Hip adduction    Hip internal rotation    Hip external rotation    Knee flexion (sitting) 114 110  Knee extension    Ankle dorsiflexion    Ankle plantarflexion    Ankle inversion  Ankle eversion     (Blank rows = not tested)  LOWER EXTREMITY MMT:    MMT Right Eval Left Eval  Hip flexion 4+ 3+  Hip extension    Hip abduction    Hip adduction    Hip internal rotation    Hip external rotation    Knee flexion 4+ 4  Knee extension 4+ 4  Ankle dorsiflexion 4+ 4  Ankle plantarflexion    Ankle inversion    Ankle eversion    (Blank rows = not tested)   TRANSFERS: Sit to stand: Modified independence  Assistive device utilized: None     Stand to sit: Modified independence  Assistive device utilized: None      STAIRS: Findings: Level of Assistance: SBA, Stair Negotiation Technique: Step to Pattern Alternating Pattern  with Bilateral Rails, Number of Stairs: 2-3, Height of Stairs: 4-6   , and Comments: step-through pattern ascending, step-to pattern descending GAIT: Findings: Gait Characteristics: keeps L knee slightly flexed and L foot externally rotated, step through pattern, antalgic, and wide BOS, Distance walked: 50 ft, Assistive device utilized:Walker - 4 wheeled, and Level of assistance: SBA  Pt does report sometimes catching L foot on walker.  FUNCTIONAL TESTS:  5 times sit to stand: 22.72 with slight increase in L knee pain Timed up and go (TUG): 17.97 sec 10 meter walk test: 14.41 sec = 2.28 ft/sec with rollator                                                                                                                               TREATMENT DATE: 08/08/2023    PATIENT EDUCATION: Education details: Eval results, POC initial HEP Person educated: Patient and Parent Education method: Explanation, Demonstration, Verbal cues, and Handouts Education comprehension: verbalized understanding,  returned demonstration, and needs further education  HOME EXERCISE PROGRAM: Access Code: [email or web address] URL: [email or web address] Date: 08/08/2023 Prepared by: [facility] - Outpatient  Rehab - [facility]  Program Notes Sit at edge of your bed-gently bend and straighten your knee, 10 times.  Relax and repeat  Exercises - Supine Heel Slide  - 1 x daily - 7 x weekly - 1-2 sets - 10 reps - Supine Quad Set  - 1 x daily - 7 x weekly - 1-2 sets - 10 reps - 3 sec hold  GOALS: Goals reviewed with patient? Yes  SHORT TERM GOALS: Target date: 08/18/2023  Pt will be independent with HEP for improved strength, flexibility, balance. Baseline:  No current HEP Goal status: INITIAL  2.  Knee flexion to improve to at least 120 degrees LLE without pain limiting. Baseline: 110 measured in sitting Goal status: INITIAL   LONG TERM GOALS: Target date: 09/15/2023  Pt will be independent with progression of HEP for improved strength, flexibility, balance, pain. Baseline: No current HEP Goal status: INITIAL  2.  Pt will improve 5x sit<>stand to less than or equal to 15 sec to demonstrate improved functional  strength and transfer efficiency. Baseline: 22.72 sec Goal status: INITIAL  3.  Pt will improve TUG score to less than or equal to 14 sec for decreased fall risk. Baseline: 17.97 sec Goal status: INITIAL  4.  Pt will improve gait velocity to at least 2.62 ft/sec for improved gait efficiency and safety. Baseline: 2.28 ft/sec Goal status: INITIAL  5.  Pt will verbalize plans for continued community fitness upon d/c from PT to maximize gains made in PT. Baseline: No current plans Goal status: INITIAL   ASSESSMENT:  CLINICAL IMPRESSION: Patient is a 38 y.o. female who was seen today for physical therapy evaluation and treatment for L knee contusion.  She has hx of CVA with L sided weakness, hx of brain tumor (as a child), hx of concussion and a hx of falls.  She had fall  several weeks ago, where she was trying to step down off of a step with her rollator; she lost balance and fell, landing on her L knee.  She followed up with MD due to L knee pain and swelling; per mom and pt report, x-rays were negative (MD notes/imaging not available on EPIC).  Patient reports knee is feeling better, but she has some pain with extreme knee flexion; she has some tendernesss to palpation along lateral and inferior aspect of L knee, some slight swelling noted L > R and she has decreased L patellar mobility.  She does also present with decreased strength, decreased balance, decreased independence with gait/mobility.  She is at increased fall risk per TUG, FTSTS measures and is classified as limited community ambulator per gait velocity with her rollator.  She would benefit from skilled PT to address the above stated deficits to decrease fall risk and improve overall functional mobility and independence.  OBJECTIVE IMPAIRMENTS: Abnormal gait, decreased balance, decreased mobility, decreased ROM, decreased strength, impaired flexibility, and pain.   ACTIVITY LIMITATIONS: standing, transfers, and locomotion level  PARTICIPATION LIMITATIONS: shopping and community activity  PERSONAL FACTORS: 3+ comorbidities: see PMH above; recent fall with L knee contusion are also affecting patient's functional outcome.   REHAB POTENTIAL: Good  CLINICAL DECISION MAKING: Evolving/moderate complexity  EVALUATION COMPLEXITY: Moderate  PLAN:  PT FREQUENCY: 1-2x/week  PT DURATION: 6 weeks including eval week  PLANNED INTERVENTIONS: 97750- Physical Performance Testing, 97110-Therapeutic exercises, 97530- Therapeutic activity, 97112- Neuromuscular re-education, 97535- Self Care, 02859- Manual therapy, [phone]- Gait training, Patient/Family education, Balance training, Stair training, Taping, and Cryotherapy  PLAN FOR NEXT SESSION: HEP progression for flexibility, strength, balance; work on Nustep if  patient can tolerate.  Sit to stand, gentle squats, ankle strengthening   Emelee Rodocker W., PT 08/08/2023, 5:29 PM   [facility] Outpatient Rehab at [facility] [address] [place], [address] [place], [place] [address] Phone # [phone] Fax # [phone]   For all possible CPT codes, reference the Planned Interventions line above.     Check all conditions that are expected to impact treatment: {Conditions expected to impact treatment:Neurological condition and/or seizures   If treatment provided at initial evaluation, no treatment charged due to lack of authorization.

## 2023-08-10 ENCOUNTER — Ambulatory Visit

## 2023-08-10 DIAGNOSIS — M25562 Pain in left knee: Secondary | ICD-10-CM

## 2023-08-10 DIAGNOSIS — R2681 Unsteadiness on feet: Secondary | ICD-10-CM

## 2023-08-10 DIAGNOSIS — R262 Difficulty in walking, not elsewhere classified: Secondary | ICD-10-CM

## 2023-08-10 DIAGNOSIS — R2689 Other abnormalities of gait and mobility: Secondary | ICD-10-CM | POA: Diagnosis not present

## 2023-08-10 DIAGNOSIS — R296 Repeated falls: Secondary | ICD-10-CM

## 2023-08-10 DIAGNOSIS — M6281 Muscle weakness (generalized): Secondary | ICD-10-CM

## 2023-08-10 NOTE — Therapy (Signed)
 OUTPATIENT PHYSICAL THERAPY NEURO TREATMENT   Patient Name: Tracey Perkins MRN: [id number] DOB:Sep 19, 1985, 38 y.o., female Today's Date: 08/10/2023   PCP: Janey Santos, MD REFERRING PROVIDER: Irving Delinda ORN, MD  END OF SESSION:  PT End of Session - 08/10/23 1529     Visit Number 2    Number of Visits 13    Date for PT Re-Evaluation 09/15/23    Authorization Type [place] Medicaid [employer]    PT Start Time 1530    PT Stop Time 1615    PT Time Calculation (min) 45 min    Equipment Utilized During Treatment Gait belt    Activity Tolerance Patient tolerated treatment well    Behavior During Therapy WFL for tasks assessed/performed          Past Medical History:  Diagnosis Date   Asthma    Brain tumor (HCC)    Diabetes mellitus without complication (HCC)    Eczema    Food allergy    Gastroparesis    History of CVA (cerebrovascular accident) 09/03/2020   Hypokalemia    Hypomagnesemia    Hypothyroid    Intractable nausea and vomiting 07/12/2021   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   Mild cognitive impairment 09/04/2020   Neuropathy    Post concussion syndrome 05/2019   Vision changes    Past Surgical History:  Procedure Laterality Date   BRAIN SURGERY     Portacath placement and removed     WISDOM TOOTH EXTRACTION     Patient Active Problem List   Diagnosis Date Noted   Acute respiratory failure with hypoxia (HCC) 12/18/2022   Class 1 obesity 12/18/2022   Multifocal pneumonia 12/18/2022   Hyperlipidemia 12/18/2022   Colitis 12/22/2021   Tachycardia 07/12/2021   Intractable nausea and vomiting 07/12/2021   Diarrhea 07/12/2021   Diabetic neuropathy (HCC) 07/12/2021   Hypothyroidism 07/12/2021   CVA (cerebral vascular accident) (HCC) 09/16/2020   Mild cognitive impairment 09/04/2020   Diabetic gastroparesis (HCC) 09/04/2020   History of CVA (cerebrovascular accident) 09/03/2020   Diabetic polyneuropathy associated with type 1 diabetes mellitus (HCC)  05/15/2020   Post concussive syndrome 05/15/2020   Insulin  pump status 04/02/2020   Impulsiveness 10/14/2019   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   Mood disorder (HCC) 08/27/2019   Anxiety 08/16/2019   Polyneuropathy 06/19/2019   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   Status post radiation therapy 11/22/2018   Seasonal and perennial allergic rhinitis 11/22/2018   Seasonal allergic conjunctivitis 11/22/2018   Acute sinusitis 11/22/2018   Keratosis pilaris 10/05/2017   Melanocytic nevi of trunk 10/05/2017   Adverse food reaction 02/09/2017   Gastroesophageal reflux disease 02/09/2017   Mild intermittent asthma with acute exacerbation 11/19/2015   Mild persistent asthma without complication 11/16/2015   Acute seasonal allergic rhinitis due to pollen 11/16/2015   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   Type 1 diabetes (HCC) 11/16/2015   Dry mouth 11/16/2015   Xerosis cutis 11/16/2015   Nystagmus 08/15/2014   Visual field defect 08/15/2014   Disequilibrium 07/25/2014   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    ONSET DATE: 07/19/2023 (MD referral)  REFERRING DIAG:  LEFT KNEE CONTUSION, HISTORY OF STROKE WITH LEFT SIDEED WEAKNESS, HX OF BRAIN TUMOR   THERAPY DIAG:  Acute pain of left knee  Muscle weakness (generalized)  Other abnormalities of gait and mobility  Unsteadiness  on feet  Difficulty in walking, not elsewhere classified  Repeated falls  Rationale for Evaluation and Treatment: Rehabilitation  SUBJECTIVE:                                                                                                                                                                                             SUBJECTIVE STATEMENT: Had a good week at camp. Noting that left knee is stiff when walking down  stairs  Pt accompanied by: family member, mom  PERTINENT HISTORY: medical history significant of asthma, brain tumor as a child medulloblastoma, history of TBI as an adult, history of CVA, mild cognitive impairment, postconcussion syndrome, vision changes, type 1 diabetes, diabetic gastroparesis, intractable nausea and vomiting, diabetic peripheral neuropathy, eczema, seasonal allergies, food allergy,hypothyroidism, OSA on CPAP, neuropathy pain  PAIN:  Are you having pain? Occasional pain in L knee/knee cap, worse with bending the knee  PRECAUTIONS: Fall and Other: HOH    Vision/depth perception problems RED FLAGS: None   WEIGHT BEARING RESTRICTIONS: No  FALLS: Has patient fallen in last 6 months? Yes. Number of falls multiple  LIVING ENVIRONMENT: Lives with: lives with their family Lives in: House/apartment Stairs: Yes: Internal: flight steps; rails Has following equipment at home: [name], Wheelchair (manual), and no walker in the house  PLOF: independent with device; enjoys writing poetry and short stories; likes walking around [employer] and some trails  PATIENT GOALS: To help with my balance, to help my knee feel better  OBJECTIVE:   TODAY'S TREATMENT: 08/10/23 Activity Comments  Supine QS 2x10   Supine heel slides 1x10 120 deg left knee flexion, no pain  Gait training Trials w/ foot-up on left for improved clearance, worked well  Seated hamstring curls 2x10 Green loop  Static multisensory balance CGA-min A throughout due to unsteadiness/LOB       PATIENT EDUCATION: Education details: Chiropodist, POC initial HEP Person educated: Patient and Parent Education method: Explanation, Demonstration, Verbal cues, and Handouts Education comprehension: verbalized understanding, returned demonstration, and needs further education  HOME EXERCISE PROGRAM: Access Code: [email or web address] URL: [email or web address] Date: 08/08/2023 Prepared by: [facility] -  Outpatient  Rehab - [facility]  Program Notes Sit at edge of your bed-gently bend and straighten your knee, 10 times.  Relax and repeat  Exercises - Supine Heel Slide  - 1 x daily - 7 x weekly - 1-2 sets - 10 reps - Supine Quad Set  - 1 x daily - 7 x weekly - 1-2 sets - 10 reps - 3 sec hold - Seated Hamstring Curl with Anchored Resistance  - 1 x  daily - 7 x weekly - 3 sets - 10 reps   Note: Objective measures were completed at Evaluation unless otherwise noted.  DIAGNOSTIC FINDINGS: per pt/mom-x-rays negative  COGNITION: Overall cognitive status: History of cognitive impairments - at baseline and pt reports slower processing   SENSATION: Light touch: WFL  COORDINATION: Slowed alternating taps  PALPATION:  Tender to palpation L > R knee at Lateral, posterior aspect of patella  Negative for ligamentous laxity or pain in ant/post or lateral directions  Decreased patellar motion L knee  LOWER EXTREMITY ROM:     Active  Right Eval Left Eval  Hip flexion    Hip extension    Hip abduction    Hip adduction    Hip internal rotation    Hip external rotation    Knee flexion (sitting) 114 110  Knee extension    Ankle dorsiflexion    Ankle plantarflexion    Ankle inversion    Ankle eversion     (Blank rows = not tested)  LOWER EXTREMITY MMT:    MMT Right Eval Left Eval  Hip flexion 4+ 3+  Hip extension    Hip abduction    Hip adduction    Hip internal rotation    Hip external rotation    Knee flexion 4+ 4  Knee extension 4+ 4  Ankle dorsiflexion 4+ 4  Ankle plantarflexion    Ankle inversion    Ankle eversion    (Blank rows = not tested)   TRANSFERS: Sit to stand: Modified independence  Assistive device utilized: None     Stand to sit: Modified independence  Assistive device utilized: None      STAIRS: Findings: Level of Assistance: SBA, Stair Negotiation Technique: Step to Pattern Alternating Pattern  with Bilateral Rails, Number of Stairs:  2-3, Height of Stairs: 4-6   , and Comments: step-through pattern ascending, step-to pattern descending GAIT: Findings: Gait Characteristics: keeps L knee slightly flexed and L foot externally rotated, step through pattern, antalgic, and wide BOS, Distance walked: 50 ft, Assistive device utilized:Walker - 4 wheeled, and Level of assistance: SBA  Pt does report sometimes catching L foot on walker.  FUNCTIONAL TESTS:  5 times sit to stand: 22.72 with slight increase in L knee pain Timed up and go (TUG): 17.97 sec 10 meter walk test: 14.41 sec = 2.28 ft/sec with rollator                                                                                                                               TREATMENT DATE: 08/08/2023      GOALS: Goals reviewed with patient? Yes  SHORT TERM GOALS: Target date: 08/18/2023  Pt will be independent with HEP for improved strength, flexibility, balance. Baseline:  No current HEP Goal status: INITIAL  2.  Knee flexion to improve to at least 120 degrees LLE without pain limiting. Baseline: 110 measured in sitting Goal status: INITIAL   LONG  TERM GOALS: Target date: 09/15/2023  Pt will be independent with progression of HEP for improved strength, flexibility, balance, pain. Baseline: No current HEP Goal status: INITIAL  2.  Pt will improve 5x sit<>stand to less than or equal to 15 sec to demonstrate improved functional strength and transfer efficiency. Baseline: 22.72 sec Goal status: INITIAL  3.  Pt will improve TUG score to less than or equal to 14 sec for decreased fall risk. Baseline: 17.97 sec Goal status: INITIAL  4.  Pt will improve gait velocity to at least 2.62 ft/sec for improved gait efficiency and safety. Baseline: 2.28 ft/sec Goal status: INITIAL  5.  Pt will verbalize plans for continued community fitness upon d/c from PT to maximize gains made in PT. Baseline: No current plans Goal status: INITIAL   ASSESSMENT:  CLINICAL  IMPRESSION: Good recall to HEP and able to achieve 120 degrees left knee flexion in supine w/out discomfort. Pt questions if AFO would help her gait as she used one in her adolescence. Demo 4/5 tib anterior strength on left but notably when walking decreased left foot clearance and degree of circumduction. Trial w/ foot-up device on left shoe which helped significantly in increasing foot clearance and a more narrow BOS w/ decrease in lateral staggering and provided examples of options for purchase.  Instructed in hamstring strengthening to improve knee control and progress flexion control for stair negotiation. Static multisensory balance activities to improve postural stability w/ moderate to severe sway on compliant surface and eyes closed conditions and/or with head movements. Continued sessions to progress POC details to improve LLE function and dynamic balance to reduce risk for falls  OBJECTIVE IMPAIRMENTS: Abnormal gait, decreased balance, decreased mobility, decreased ROM, decreased strength, impaired flexibility, and pain.   ACTIVITY LIMITATIONS: standing, transfers, and locomotion level  PARTICIPATION LIMITATIONS: shopping and community activity  PERSONAL FACTORS: 3+ comorbidities: see PMH above; recent fall with L knee contusion are also affecting patient's functional outcome.   REHAB POTENTIAL: Good  CLINICAL DECISION MAKING: Evolving/moderate complexity  EVALUATION COMPLEXITY: Moderate  PLAN:  PT FREQUENCY: 1-2x/week  PT DURATION: 6 weeks including eval week  PLANNED INTERVENTIONS: 97750- Physical Performance Testing, 97110-Therapeutic exercises, 97530- Therapeutic activity, 97112- Neuromuscular re-education, 97535- Self Care, 02859- Manual therapy, [phone]- Gait training, Patient/Family education, Balance training, Stair training, Taping, and Cryotherapy  PLAN FOR NEXT SESSION: HEP progression for flexibility, strength, balance; work on Nustep if patient can tolerate.  Sit to  stand, gentle squats, ankle strengthening   Jonette MARLA Sandifer, PT 08/10/2023, 3:29 PM   [facility] Health Outpatient Rehab at [facility] [address] [place], [address] [place], [place] [address] Phone # [phone] Fax # [phone]

## 2023-08-14 NOTE — Therapy (Signed)
 OUTPATIENT PHYSICAL THERAPY NEURO TREATMENT   Patient Name: Tracey Perkins MRN: [id number] DOB:1985-09-01, 38 y.o., female Today's Date: 08/15/2023   PCP: Janey Santos, MD REFERRING PROVIDER: Irving Delinda ORN, MD  END OF SESSION:  PT End of Session - 08/15/23 1232     Visit Number 3    Number of Visits 13    Date for PT Re-Evaluation 09/15/23    Authorization Type [place] [employer]    PT Start Time 1146    PT Stop Time 1230    PT Time Calculation (min) 44 min    Equipment Utilized During Treatment Gait belt    Activity Tolerance Patient tolerated treatment well    Behavior During Therapy WFL for tasks assessed/performed           Past Medical History:  Diagnosis Date   Asthma    Brain tumor (HCC)    Diabetes mellitus without complication (HCC)    Eczema    Food allergy    Gastroparesis    History of CVA (cerebrovascular accident) 09/03/2020   Hypokalemia    Hypomagnesemia    Hypothyroid    Intractable nausea and vomiting 07/12/2021   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   Mild cognitive impairment 09/04/2020   Neuropathy    Post concussion syndrome 05/2019   Vision changes    Past Surgical History:  Procedure Laterality Date   BRAIN SURGERY     Portacath placement and removed     WISDOM TOOTH EXTRACTION     Patient Active Problem List   Diagnosis Date Noted   Acute respiratory failure with hypoxia (HCC) 12/18/2022   Class 1 obesity 12/18/2022   Multifocal pneumonia 12/18/2022   Hyperlipidemia 12/18/2022   Colitis 12/22/2021   Tachycardia 07/12/2021   Intractable nausea and vomiting 07/12/2021   Diarrhea 07/12/2021   Diabetic neuropathy (HCC) 07/12/2021   Hypothyroidism 07/12/2021   CVA (cerebral vascular accident) (HCC) 09/16/2020   Mild cognitive impairment 09/04/2020   Diabetic gastroparesis (HCC) 09/04/2020   History of CVA (cerebrovascular accident) 09/03/2020   Diabetic polyneuropathy associated with type 1 diabetes mellitus (HCC)  05/15/2020   Post concussive syndrome 05/15/2020   Insulin  pump status 04/02/2020   Impulsiveness 10/14/2019   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   Mood disorder (HCC) 08/27/2019   Anxiety 08/16/2019   Polyneuropathy 06/19/2019   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   Status post radiation therapy 11/22/2018   Seasonal and perennial allergic rhinitis 11/22/2018   Seasonal allergic conjunctivitis 11/22/2018   Acute sinusitis 11/22/2018   Keratosis pilaris 10/05/2017   Melanocytic nevi of trunk 10/05/2017   Adverse food reaction 02/09/2017   Gastroesophageal reflux disease 02/09/2017   Mild intermittent asthma with acute exacerbation 11/19/2015   Mild persistent asthma without complication 11/16/2015   Acute seasonal allergic rhinitis due to pollen 11/16/2015   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   Type 1 diabetes (HCC) 11/16/2015   Dry mouth 11/16/2015   Xerosis cutis 11/16/2015   Nystagmus 08/15/2014   Visual field defect 08/15/2014   Disequilibrium 07/25/2014   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   Pt accompanied by: family member, father  PERTINENT HISTORY: medical history significant of asthma, brain tumor as  a child medulloblastoma, history of TBI as an adult, history of CVA, mild cognitive impairment, postconcussion syndrome, vision changes, type 1 diabetes, diabetic gastroparesis, intractable nausea and vomiting, diabetic peripheral neuropathy, eczema, seasonal allergies, food allergy,hypothyroidism, OSA on CPAP, neuropathy pain  PAIN:  Are you having pain? no  PRECAUTIONS: Fall and Other: HOH    Vision/depth perception problems RED FLAGS: None   WEIGHT BEARING RESTRICTIONS: No  FALLS: Has patient fallen in last 6 months? Yes. Number of falls multiple  LIVING ENVIRONMENT: Lives with: lives with their family Lives in: House/apartment Stairs: Yes: Internal: flight steps; rails Has following equipment at home: [name], Wheelchair (manual), and no walker in the house  PLOF: independent with device; enjoys writing poetry and short stories; likes walking around [employer] and some trails  PATIENT GOALS: To help with my balance, to help my knee feel better  OBJECTIVE:     TODAY'S TREATMENT: 08/15/23 Activity Comments  Nustep L4 x 6 min UEs/LEs Cueing for pacing- to reach at least 55 SPM   mini squats to chair 2x10  Cues to avoid excessive anterior trunk lean   Detailed review and performance of HEP d/t questions and pt's poor recall Provided verbal and manual cueing. Pt tolerated all activities well   Standing heel/toe raise Cueing to prevent trunk lean d/t difficulty with DF  sidelying hip ABD 2# 2x10 each side  Cueing for posture and amplitude of kick     HOME EXERCISE PROGRAM Last updated: 08/15/23 Access Code: [email or web address] URL: [email or web address] Date: 08/15/2023 Prepared by: [facility] - Outpatient  Rehab - [facility]  Program Notes Sit at edge of your bed-gently bend and straighten your knee, 10 times.  Relax and repeat  Exercises - Supine Heel Slide  - 1 x daily - 7 x weekly - 1-2 sets - 10 reps - Supine Quad Set  - 1 x daily - 7 x weekly  - 1-2 sets - 10 reps - 3 sec hold - Seated Hamstring Curl with Anchored Resistance  - 1 x daily - 7 x weekly - 3 sets - 10 reps - Squat with Chair Touch  - 1 x  daily - 5 x weekly - 2 sets - 10 reps - Heel Toe Raises with Counter Support  - 1 x daily - 5 x weekly - 2 sets - 10 reps - Standing Hip Abduction with Counter Support  - 1 x daily - 5 x weekly - 2 sets - 10 reps   PATIENT EDUCATION: Education details: answered pt's questions on previous HEP and foot up brace, HEP update Person educated: Patient and Parent Education method: Explanation, Demonstration, Tactile cues, Verbal cues, and Handouts Education comprehension: verbalized understanding and returned demonstration      Note: Objective measures were completed at Evaluation unless otherwise noted.  DIAGNOSTIC FINDINGS: per pt/mom-x-rays negative  COGNITION: Overall cognitive status: History of cognitive impairments - at baseline and pt reports slower processing   SENSATION: Light touch: WFL  COORDINATION: Slowed alternating taps  PALPATION:  Tender to palpation L > R knee at Lateral, posterior aspect of patella  Negative for ligamentous laxity or pain in ant/post or lateral directions  Decreased patellar motion L knee  LOWER EXTREMITY ROM:     Active  Right Eval Left Eval  Hip flexion    Hip extension    Hip abduction    Hip adduction    Hip internal rotation    Hip external rotation    Knee flexion (sitting) 114 110  Knee extension    Ankle dorsiflexion    Ankle plantarflexion    Ankle inversion    Ankle eversion     (Blank rows = not tested)  LOWER EXTREMITY MMT:    MMT Right Eval Left Eval  Hip flexion 4+ 3+  Hip extension    Hip abduction    Hip adduction    Hip internal rotation    Hip external rotation    Knee flexion 4+ 4  Knee extension 4+ 4  Ankle dorsiflexion 4+ 4  Ankle plantarflexion    Ankle inversion    Ankle eversion    (Blank rows = not tested)   TRANSFERS: Sit to stand:  Modified independence  Assistive device utilized: None     Stand to sit: Modified independence  Assistive device utilized: None      STAIRS: Findings: Level of Assistance: SBA, Stair Negotiation Technique: Step to Pattern Alternating Pattern  with Bilateral Rails, Number of Stairs: 2-3, Height of Stairs: 4-6   , and Comments: step-through pattern ascending, step-to pattern descending GAIT: Findings: Gait Characteristics: keeps L knee slightly flexed and L foot externally rotated, step through pattern, antalgic, and wide BOS, Distance walked: 50 ft, Assistive device utilized:Walker - 4 wheeled, and Level of assistance: SBA  Pt does report sometimes catching L foot on walker.  FUNCTIONAL TESTS:  5 times sit to stand: 22.72 with slight increase in L knee pain Timed up and go (TUG): 17.97 sec 10 meter walk test: 14.41 sec = 2.28 ft/sec with rollator  TREATMENT DATE: 08/08/2023      GOALS: Goals reviewed with patient? Yes  SHORT TERM GOALS: Target date: 08/18/2023  Pt will be independent with HEP for improved strength, flexibility, balance. Baseline:  No current HEP Goal status: IN PROGRESS  2.  Knee flexion to improve to at least 120 degrees LLE without pain limiting. Baseline: 110 measured in sitting Goal status: IN PROGRESS   LONG TERM GOALS: Target date: 09/15/2023  Pt will be independent with progression of HEP for improved strength, flexibility, balance, pain. Baseline: No current HEP Goal status: IN PROGRESS  2.  Pt will improve 5x sit<>stand to less than or equal to 15 sec to demonstrate improved functional strength and transfer efficiency. Baseline: 22.72 sec Goal status: IN PROGRESS  3.  Pt will improve TUG score to less than or equal to 14 sec for decreased fall risk. Baseline: 17.97 sec Goal status: IN PROGRESS  4.  Pt will improve gait  velocity to at least 2.62 ft/sec for improved gait efficiency and safety. Baseline: 2.28 ft/sec Goal status: IN PROGRESS  5.  Pt will verbalize plans for continued community fitness upon d/c from PT to maximize gains made in PT. Baseline: No current plans Goal status: IN PROGRESS   ASSESSMENT:  CLINICAL IMPRESSION: Patient arrived to session with father without new complaints. Session focused on review of HEP as patient with poor recall- able ot perform and tolerate activities after review. Progressed to standing strengthening activities with cueing for posture and proper technique. Patient denied pain with today's activities but L foot drop from remote brain tumor and CVA is evident. Patient without complaints upon leaving.   OBJECTIVE IMPAIRMENTS: Abnormal gait, decreased balance, decreased mobility, decreased ROM, decreased strength, impaired flexibility, and pain.   ACTIVITY LIMITATIONS: standing, transfers, and locomotion level  PARTICIPATION LIMITATIONS: shopping and community activity  PERSONAL FACTORS: 3+ comorbidities: see PMH above; recent fall with L knee contusion are also affecting patient's functional outcome.   REHAB POTENTIAL: Good  CLINICAL DECISION MAKING: Evolving/moderate complexity  EVALUATION COMPLEXITY: Moderate  PLAN:  PT FREQUENCY: 1-2x/week  PT DURATION: 6 weeks including eval week  PLANNED INTERVENTIONS: 97750- Physical Performance Testing, 97110-Therapeutic exercises, 97530- Therapeutic activity, 97112- Neuromuscular re-education, 97535- Self Care, 02859- Manual therapy, [phone]- Gait training, Patient/Family education, Balance training, Stair training, Taping, and Cryotherapy  PLAN FOR NEXT SESSION: check STGs; HEP progression for flexibility, strength, balance; work on Nustep if patient can tolerate.  Sit to stand, gentle squats, ankle strengthening   Louana Terrilyn Christians, [place], DPT 08/15/23 12:35 PM  [facility] at [facility] [address] [place], [address] [place], [place] [address] Phone # [phone] Fax # [phone]

## 2023-08-15 ENCOUNTER — Encounter: Payer: Self-pay | Admitting: Physical Therapy

## 2023-08-15 ENCOUNTER — Ambulatory Visit: Admitting: Physical Therapy

## 2023-08-15 DIAGNOSIS — R2689 Other abnormalities of gait and mobility: Secondary | ICD-10-CM

## 2023-08-15 DIAGNOSIS — R2681 Unsteadiness on feet: Secondary | ICD-10-CM

## 2023-08-15 DIAGNOSIS — M25562 Pain in left knee: Secondary | ICD-10-CM

## 2023-08-15 DIAGNOSIS — M6281 Muscle weakness (generalized): Secondary | ICD-10-CM

## 2023-08-16 NOTE — Therapy (Incomplete)
 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   Mild cognitive impairment 09/04/2020   Neuropathy    Post concussion syndrome 05/2019   Vision changes    Past Surgical History:  Procedure Laterality Date   BRAIN SURGERY     Portacath placement and removed     WISDOM TOOTH EXTRACTION     Patient Active Problem List   Diagnosis Date Noted   Acute respiratory failure with hypoxia (HCC) 12/18/2022   Class 1 obesity 12/18/2022   Multifocal pneumonia 12/18/2022   Hyperlipidemia 12/18/2022   Colitis 12/22/2021   Tachycardia 07/12/2021   Intractable nausea and vomiting 07/12/2021   Diarrhea 07/12/2021   Diabetic neuropathy (HCC) 07/12/2021   Hypothyroidism 07/12/2021   CVA (cerebral vascular accident) (HCC) 09/16/2020   Mild cognitive impairment 09/04/2020   Diabetic gastroparesis (HCC) 09/04/2020   History of CVA (cerebrovascular accident) 09/03/2020   Diabetic polyneuropathy associated with type 1 diabetes mellitus (HCC) 05/15/2020   Post concussive syndrome 05/15/2020   Insulin  pump status 04/02/2020   Impulsiveness 10/14/2019   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   Mood disorder (HCC) 08/27/2019   Anxiety 08/16/2019   Polyneuropathy 06/19/2019   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   Status post radiation  therapy 11/22/2018   Seasonal and perennial allergic rhinitis 11/22/2018   Seasonal allergic conjunctivitis 11/22/2018   Acute sinusitis 11/22/2018   Keratosis pilaris 10/05/2017   Melanocytic nevi of trunk 10/05/2017   Adverse food reaction 02/09/2017   Gastroesophageal reflux disease 02/09/2017   Mild intermittent asthma with acute exacerbation 11/19/2015   Mild persistent asthma without complication 11/16/2015   Acute seasonal allergic rhinitis due to pollen 11/16/2015   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  Rationale for Evaluation and Treatment: Rehabilitation  SUBJECTIVE:  SUBJECTIVE STATEMENT: Knee feels stiff in the AM.   Pt accompanied by: family member, father  PERTINENT HISTORY: medical history significant of asthma, brain tumor as a child medulloblastoma, history of TBI as an adult, history of CVA, mild cognitive impairment, postconcussion syndrome, vision changes, type 1 diabetes, diabetic gastroparesis, intractable nausea and vomiting, diabetic peripheral neuropathy, eczema, seasonal allergies, food allergy,hypothyroidism, OSA on CPAP, neuropathy pain  PAIN:  Are you having pain? no  PRECAUTIONS: Fall and Other: HOH    Vision/depth perception problems RED FLAGS: None   WEIGHT BEARING RESTRICTIONS: No  FALLS: Has patient fallen in  last 6 months? Yes. Number of falls multiple  LIVING ENVIRONMENT: Lives with: lives with their family Lives in: House/apartment Stairs: Yes: Internal: flight steps; rails Has following equipment at home: [name], Wheelchair (manual), and no walker in the house  PLOF: independent with device; enjoys writing poetry and short stories; likes walking around [employer] and some trails  PATIENT GOALS: To help with my balance, to help my knee feel better  OBJECTIVE:    TODAY'S TREATMENT: 08/17/23 Activity Comments                       TODAY'S TREATMENT: 08/15/23 Activity Comments  Nustep L4 x 6 min UEs/LEs Cueing for pacing- to reach at least 55 SPM   mini squats to chair 2x10  Cues to avoid excessive anterior trunk lean   Detailed review and performance of HEP d/t questions and pt's poor recall Provided verbal and manual cueing. Pt tolerated all activities well   Standing heel/toe raise Cueing to prevent trunk lean d/t difficulty with DF  sidelying hip ABD 2# 2x10 each side  Cueing for posture and amplitude of kick     HOME EXERCISE PROGRAM Last updated: 08/15/23 Access Code: [email or web address] URL: [email or web address] Date: 08/15/2023 Prepared by: [facility] - Outpatient  Rehab - [facility]  Program Notes Sit at edge of your bed-gently bend and straighten your knee, 10 times.  Relax and repeat  Exercises - Supine Heel Slide  - 1 x daily - 7 x weekly - 1-2 sets - 10 reps - Supine Quad Set  - 1 x daily - 7 x weekly - 1-2 sets - 10 reps - 3 sec hold - Seated Hamstring Curl with Anchored Resistance  - 1 x daily - 7 x weekly - 3 sets - 10 reps - Squat with Chair Touch  - 1 x daily - 5 x weekly - 2 sets - 10 reps - Heel Toe Raises with Counter Support  - 1 x daily - 5 x weekly - 2 sets - 10 reps - Standing Hip Abduction with Counter Support  - 1 x daily - 5 x weekly - 2 sets - 10 reps   PATIENT EDUCATION: Education details: answered pt's questions  on previous HEP and foot up brace, HEP update Person educated: Patient and Parent Education method: Explanation, Demonstration, Tactile cues, Verbal cues, and Handouts Education comprehension: verbalized understanding and returned demonstration      Note: Objective measures were completed at Evaluation unless otherwise noted.  DIAGNOSTIC FINDINGS: per pt/mom-x-rays negative  COGNITION: Overall cognitive status: History of cognitive impairments - at baseline and pt reports slower processing   SENSATION: Light touch: WFL  COORDINATION: Slowed alternating taps  PALPATION:  Tender to palpation L > R knee at Lateral, posterior aspect of patella  Negative for ligamentous laxity or pain in ant/post or lateral directions  Decreased  patellar motion L knee  LOWER EXTREMITY ROM:     Active  Right Eval Left Eval  Hip flexion    Hip extension    Hip abduction    Hip adduction    Hip internal rotation    Hip external rotation    Knee flexion (sitting) 114 110  Knee extension    Ankle dorsiflexion    Ankle plantarflexion    Ankle inversion    Ankle eversion     (Blank rows = not tested)  LOWER EXTREMITY MMT:    MMT Right Eval Left Eval  Hip flexion 4+ 3+  Hip extension    Hip abduction    Hip adduction    Hip internal rotation    Hip external rotation    Knee flexion 4+ 4  Knee extension 4+ 4  Ankle dorsiflexion 4+ 4  Ankle plantarflexion    Ankle inversion    Ankle eversion    (Blank rows = not tested)   TRANSFERS: Sit to stand: Modified independence  Assistive device utilized: None     Stand to sit: Modified independence  Assistive device utilized: None      STAIRS: Findings: Level of Assistance: SBA, Stair Negotiation Technique: Step to Pattern Alternating Pattern  with Bilateral Rails, Number of Stairs: 2-3, Height of Stairs: 4-6   , and Comments: step-through pattern ascending, step-to pattern descending GAIT: Findings: Gait Characteristics: keeps L  knee slightly flexed and L foot externally rotated, step through pattern, antalgic, and wide BOS, Distance walked: 50 ft, Assistive device utilized:Walker - 4 wheeled, and Level of assistance: SBA  Pt does report sometimes catching L foot on walker.  FUNCTIONAL TESTS:  5 times sit to stand: 22.72 with slight increase in L knee pain Timed up and go (TUG): 17.97 sec 10 meter walk test: 14.41 sec = 2.28 ft/sec with rollator                                                                                                                               TREATMENT DATE: 08/08/2023      GOALS: Goals reviewed with patient? Yes  SHORT TERM GOALS: Target date: 08/18/2023  Pt will be independent with HEP for improved strength, flexibility, balance. Baseline:  No current HEP Goal status: IN PROGRESS  2.  Knee flexion to improve to at least 120 degrees LLE without pain limiting. Baseline: 110 measured in sitting Goal status: IN PROGRESS   LONG TERM GOALS: Target date: 09/15/2023  Pt will be independent with progression of HEP for improved strength, flexibility, balance, pain. Baseline: No current HEP Goal status: IN PROGRESS  2.  Pt will improve 5x sit<>stand to less than or equal to 15 sec to demonstrate improved functional strength and transfer efficiency. Baseline: 22.72 sec Goal status: IN PROGRESS  3.  Pt will improve TUG score to less than or equal to 14 sec for decreased fall risk. Baseline: 17.97 sec Goal status: IN  PROGRESS  4.  Pt will improve gait velocity to at least 2.62 ft/sec for improved gait efficiency and safety. Baseline: 2.28 ft/sec Goal status: IN PROGRESS  5.  Pt will verbalize plans for continued community fitness upon d/c from PT to maximize gains made in PT. Baseline: No current plans Goal status: IN PROGRESS   ASSESSMENT:  CLINICAL IMPRESSION: Patient arrived to session with father without new complaints. Session focused on review of HEP as patient with poor  recall- able ot perform and tolerate activities after review. Progressed to standing strengthening activities with cueing for posture and proper technique. Patient denied pain with today's activities but L foot drop from remote brain tumor and CVA is evident. Patient without complaints upon leaving.   OBJECTIVE IMPAIRMENTS: Abnormal gait, decreased balance, decreased mobility, decreased ROM, decreased strength, impaired flexibility, and pain.   ACTIVITY LIMITATIONS: standing, transfers, and locomotion level  PARTICIPATION LIMITATIONS: shopping and community activity  PERSONAL FACTORS: 3+ comorbidities: see PMH above; recent fall with L knee contusion are also affecting patient's functional outcome.   REHAB POTENTIAL: Good  CLINICAL DECISION MAKING: Evolving/moderate complexity  EVALUATION COMPLEXITY: Moderate  PLAN:  PT FREQUENCY: 1-2x/week  PT DURATION: 6 weeks including eval week  PLANNED INTERVENTIONS: 97750- Physical Performance Testing, 97110-Therapeutic exercises, 97530- Therapeutic activity, 97112- Neuromuscular re-education, 97535- Self Care, 02859- Manual therapy, [phone]- Gait training, Patient/Family education, Balance training, Stair training, Taping, and Cryotherapy  PLAN FOR NEXT SESSION: check STGs; HEP progression for flexibility, strength, balance; work on Nustep if patient can tolerate.  Sit to stand, gentle squats, ankle strengthening   Louana Terrilyn Christians, PT, DPT 08/16/23 7:50 AM  [facility] Re[facility] at [facility] [address] [place], [address] [place], [place] [address] Phone # [phone] Fax # [phone]

## 2023-08-17 ENCOUNTER — Ambulatory Visit: Admitting: Physical Therapy

## 2023-08-22 ENCOUNTER — Ambulatory Visit: Attending: Family Medicine | Admitting: Physical Therapy

## 2023-08-22 ENCOUNTER — Encounter: Payer: Self-pay | Admitting: Physical Therapy

## 2023-08-22 ENCOUNTER — Ambulatory Visit: Admitting: Rehabilitative and Restorative Service Providers"

## 2023-08-22 DIAGNOSIS — M25562 Pain in left knee: Secondary | ICD-10-CM | POA: Insufficient documentation

## 2023-08-22 DIAGNOSIS — R2681 Unsteadiness on feet: Secondary | ICD-10-CM | POA: Diagnosis present

## 2023-08-22 DIAGNOSIS — R2689 Other abnormalities of gait and mobility: Secondary | ICD-10-CM | POA: Insufficient documentation

## 2023-08-22 DIAGNOSIS — M6281 Muscle weakness (generalized): Secondary | ICD-10-CM | POA: Insufficient documentation

## 2023-08-22 NOTE — Therapy (Signed)
 OUTPATIENT PHYSICAL THERAPY NEURO TREATMENT   Patient Name: Tracey Perkins MRN: [id number] DOB:January 12, 1986, 38 y.o., female Today's Date: 08/22/2023   PCP: Janey Santos, MD REFERRING PROVIDER: Irving Delinda ORN, MD  END OF SESSION:  PT End of Session - 08/22/23 0802     Visit Number 4    Number of Visits 13    Date for PT Re-Evaluation 09/15/23    Authorization Type [place] [employer]    Authorization Time Period 08/08/2023-10/06/2023    Authorization - Visit Number 3    Authorization - Number of Visits 5    PT Start Time 0801    PT Stop Time 0845    PT Time Calculation (min) 44 min    Equipment Utilized During Treatment Gait belt    Activity Tolerance Patient tolerated treatment well    Behavior During Therapy WFL for tasks assessed/performed            Past Medical History:  Diagnosis Date   Asthma    Brain tumor (HCC)    Diabetes mellitus without complication (HCC)    Eczema    Food allergy    Gastroparesis    History of CVA (cerebrovascular accident) 09/03/2020   Hypokalemia    Hypomagnesemia    Hypothyroid    Intractable nausea and vomiting 07/12/2021   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   Mild cognitive impairment 09/04/2020   Neuropathy    Post concussion syndrome 05/2019   Vision changes    Past Surgical History:  Procedure Laterality Date   BRAIN SURGERY     Portacath placement and removed     WISDOM TOOTH EXTRACTION     Patient Active Problem List   Diagnosis Date Noted   Acute respiratory failure with hypoxia (HCC) 12/18/2022   Class 1 obesity 12/18/2022   Multifocal pneumonia 12/18/2022   Hyperlipidemia 12/18/2022   Colitis 12/22/2021   Tachycardia 07/12/2021   Intractable nausea and vomiting 07/12/2021   Diarrhea 07/12/2021   Diabetic neuropathy (HCC) 07/12/2021   Hypothyroidism 07/12/2021   CVA (cerebral vascular accident) (HCC) 09/16/2020   Mild cognitive impairment 09/04/2020   Diabetic gastroparesis (HCC) 09/04/2020    History of CVA (cerebrovascular accident) 09/03/2020   Diabetic polyneuropathy associated with type 1 diabetes mellitus (HCC) 05/15/2020   Post concussive syndrome 05/15/2020   Insulin  pump status 04/02/2020   Impulsiveness 10/14/2019   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   Mood disorder (HCC) 08/27/2019   Anxiety 08/16/2019   Polyneuropathy 06/19/2019   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   Status post radiation therapy 11/22/2018   Seasonal and perennial allergic rhinitis 11/22/2018   Seasonal allergic conjunctivitis 11/22/2018   Acute sinusitis 11/22/2018   Keratosis pilaris 10/05/2017   Melanocytic nevi of trunk 10/05/2017   Adverse food reaction 02/09/2017   Gastroesophageal reflux disease 02/09/2017   Mild intermittent asthma with acute exacerbation 11/19/2015   Mild persistent asthma without complication 11/16/2015   Acute seasonal allergic rhinitis due to pollen 11/16/2015   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   Type 1 diabetes (HCC) 11/16/2015   Dry mouth 11/16/2015   Xerosis cutis 11/16/2015   Nystagmus 08/15/2014   Visual field defect 08/15/2014   Disequilibrium 07/25/2014   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    ONSET DATE: 07/19/2023 (MD referral)  REFERRING DIAG:  LEFT KNEE CONTUSION, HISTORY OF STROKE WITH LEFT SIDEED WEAKNESS, HX  OF BRAIN TUMOR   THERAPY DIAG:  Acute pain of left knee  Muscle weakness (generalized)  Other abnormalities of gait and mobility  Unsteadiness on feet  Rationale for Evaluation and Treatment: Rehabilitation  SUBJECTIVE:                                                                                                                                                                                             SUBJECTIVE STATEMENT: Brought in the foot  brace today and want to try to make sure I'm doing it correctly.  No pain and no knee popping this week.  Pt accompanied by: family member, father  PERTINENT HISTORY: medical history significant of asthma, brain tumor as a child medulloblastoma, history of TBI as an adult, history of CVA, mild cognitive impairment, postconcussion syndrome, vision changes, type 1 diabetes, diabetic gastroparesis, intractable nausea and vomiting, diabetic peripheral neuropathy, eczema, seasonal allergies, food allergy,hypothyroidism, OSA on CPAP, neuropathy pain  PAIN:  Are you having pain? no  PRECAUTIONS: Fall and Other: HOH    Vision/depth perception problems RED FLAGS: None   WEIGHT BEARING RESTRICTIONS: No  FALLS: Has patient fallen in last 6 months? Yes. Number of falls multiple  LIVING ENVIRONMENT: Lives with: lives with their family Lives in: House/apartment Stairs: Yes: Internal: flight steps; rails Has following equipment at home: [name], Wheelchair (manual), and no walker in the house  PLOF: independent with device; enjoys writing poetry and short stories; likes walking around [employer] and some trails  PATIENT GOALS: To help with my balance, to help my knee feel better  OBJECTIVE:     TODAY'S TREATMENT: 08/22/2023 Activity Comments  Pt brings her her new foot up brace and PT spends time educating/demo/practicing don/doff brace L foot Pt able to return demo with extra time and occasional min assist  Gait x 80 ft with foot up brace, cues for slowed pace and attention to L heelstrike   Review of HEP Good return demo of HEP  Forward/back stepping 1 UE support  Alternate tapping, 2 x 10 BUE>1 UE support  Standing on Airex:   Feet apart/feet together head turns/nods, 3-5 reps Feet apart EC 10 sec-mod sway Pt becomes dizzy with head turns-becomes tearful/anxious, so discontinued this exercise  Seated L knee flexion, 10 reps 110 degrees flexion, no pain          HOME  EXERCISE PROGRAM Last updated: 08/15/23 Access Code: [email or web address] URL: [email or web address] Date: 08/15/2023 Prepared by: [facility] - Outpatient  Rehab - [facility]  Program Notes Sit at edge of your bed-gently bend and straighten your knee, 10 times.  Relax and repeat  Exercises - Supine Heel Slide  - 1 x daily - 7 x weekly - 1-2 sets - 10 reps - Supine Quad Set  - 1 x daily - 7 x weekly - 1-2 sets - 10 reps - 3 sec hold - Seated Hamstring Curl with Anchored Resistance  - 1 x daily - 7 x weekly - 3 sets - 10 reps - Squat with Chair Touch  - 1 x daily - 5 x weekly - 2 sets - 10 reps - Heel Toe Raises with Counter Support  - 1 x daily - 5 x weekly - 2 sets - 10 reps - Standing Hip Abduction with Counter Support  - 1 x daily - 5 x weekly - 2 sets - 10 reps   PATIENT EDUCATION: Education details: Educated on donning/doffing AFO; how to adjust for optimal dorsiflexion for improved heelstrike with gait; discussed POC and 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  DIAGNOSTIC FINDINGS: per pt/mom-x-rays negative  COGNITION: Overall cognitive status: History of cognitive impairments - at baseline and pt reports slower processing   SENSATION: Light touch: WFL  COORDINATION: Slowed alternating taps  PALPATION:  Tender to palpation L > R knee at Lateral, posterior aspect of patella  Negative for ligamentous laxity or pain in ant/post or lateral directions  Decreased patellar motion L knee  LOWER EXTREMITY ROM:     Active  Right Eval Left Eval  Hip flexion    Hip extension    Hip abduction    Hip adduction    Hip internal rotation    Hip external rotation     Knee flexion (sitting) 114 110  Knee extension    Ankle dorsiflexion    Ankle plantarflexion    Ankle inversion    Ankle eversion     (Blank rows = not tested)  LOWER EXTREMITY MMT:    MMT Right Eval Left Eval  Hip flexion 4+ 3+  Hip extension    Hip abduction    Hip adduction    Hip internal rotation    Hip external rotation    Knee flexion 4+ 4  Knee extension 4+ 4  Ankle dorsiflexion 4+ 4  Ankle plantarflexion    Ankle inversion    Ankle eversion    (Blank rows = not tested)   TRANSFERS: Sit to stand: Modified independence  Assistive device utilized: None     Stand to sit: Modified independence  Assistive device utilized: None      STAIRS: Findings: Level of Assistance: SBA, Stair Negotiation Technique: Step to Pattern Alternating Pattern  with Bilateral Rails, Number of Stairs: 2-3, Height of Stairs: 4-6   , and Comments: step-through pattern ascending, step-to pattern descending GAIT: Findings: Gait Characteristics: keeps L knee slightly flexed and L foot externally rotated, step through pattern, antalgic, and wide BOS, Distance walked: 50 ft, Assistive device utilized:Walker - 4 wheeled, and Level of assistance: SBA  Pt does report sometimes catching L foot on walker.  FUNCTIONAL TESTS:  5 times sit to stand: 22.72 with slight increase in L knee pain Timed up and go (TUG): 17.97 sec 10 meter walk test: 14.41 sec = 2.28 ft/sec with rollator  TREATMENT DATE: 08/08/2023      GOALS: Goals reviewed with patient? Yes  SHORT TERM GOALS: Target date: 08/18/2023  Pt will be independent with HEP for improved strength, flexibility, balance. Baseline:  No current HEP Goal status: MET, 08/22/2023  2.  Knee flexion to improve to at least 120 degrees LLE without pain limiting. Baseline: 110 measured in sitting-no pain, 08/22/2023 Goal status: IN  PROGRESS   LONG TERM GOALS: Target date: 09/15/2023  Pt will be independent with progression of HEP for improved strength, flexibility, balance, pain. Baseline: No current HEP Goal status: IN PROGRESS  2.  Pt will improve 5x sit<>stand to less than or equal to 15 sec to demonstrate improved functional strength and transfer efficiency. Baseline: 22.72 sec Goal status: IN PROGRESS  3.  Pt will improve TUG score to less than or equal to 14 sec for decreased fall risk. Baseline: 17.97 sec Goal status: IN PROGRESS  4.  Pt will improve gait velocity to at least 2.62 ft/sec for improved gait efficiency and safety. Baseline: 2.28 ft/sec Goal status: IN PROGRESS  5.  Pt will verbalize plans for continued community fitness upon d/c from PT to maximize gains made in PT. Baseline: No current plans Goal status: IN PROGRESS   ASSESSMENT:  CLINICAL IMPRESSION: Pt presents today with no new complaints; she brings in her foot up brace. Skilled PT session focused on education on donning/doffing/appropriate fitting of foot up brace.  Also reviewed HEP and pt appears independent with HEP (she does report family helps some with exercises).   Attempted standing balance exercises on solid and compliant surfaces; with head motions, pt reports becoming dizzy (due to longstanding hx of nystagmus) and becomes anxious; discontinued this exercise.  Pt has met STG 1 and STG 2 is in progress-seated knee flexion is 110, but with no pain. Pt will continue to benefit from skilled PT towards goals for improved functional mobility and decreased fall risk.   OBJECTIVE IMPAIRMENTS: Abnormal gait, decreased balance, decreased mobility, decreased ROM, decreased strength, impaired flexibility, and pain.   ACTIVITY LIMITATIONS: standing, transfers, and locomotion level  PARTICIPATION LIMITATIONS: shopping and community activity  PERSONAL FACTORS: 3+ comorbidities: see PMH above; recent fall with L knee contusion are also  affecting patient's functional outcome.   REHAB POTENTIAL: Good  CLINICAL DECISION MAKING: Evolving/moderate complexity  EVALUATION COMPLEXITY: Moderate  PLAN:  PT FREQUENCY: 1-2x/week  PT DURATION: 6 weeks including eval week  PLANNED INTERVENTIONS: 97750- Physical Performance Testing, 97110-Therapeutic exercises, 97530- Therapeutic activity, 97112- Neuromuscular re-education, 97535- Self Care, [id number]- Manual therapy, [phone]- Gait training, Patient/Family education, Balance training, Stair training, Taping, and Cryotherapy  PLAN FOR NEXT SESSION: *Need to submit for reauth and more visits* HEP progression for strength, balance; work on Nustep if patient can tolerate.  Sit to stand, gentle squats, ankle strengthening; try corner balance exercises (pt does not tolerate head turns well due to nystagmus)   Greig Anon, PT 08/22/23 9:11 AM Phone: [phone] Fax: [phone]  [facility] at [facility] [address], [address] [place], [place] [address] Phone # [phone] Fax # [phone]

## 2023-08-24 ENCOUNTER — Encounter: Payer: Self-pay | Admitting: Physical Therapy

## 2023-08-24 ENCOUNTER — Ambulatory Visit: Admitting: Physical Therapy

## 2023-08-24 DIAGNOSIS — M6281 Muscle weakness (generalized): Secondary | ICD-10-CM

## 2023-08-24 DIAGNOSIS — M25562 Pain in left knee: Secondary | ICD-10-CM | POA: Diagnosis not present

## 2023-08-24 DIAGNOSIS — R2681 Unsteadiness on feet: Secondary | ICD-10-CM

## 2023-08-24 DIAGNOSIS — R2689 Other abnormalities of gait and mobility: Secondary | ICD-10-CM

## 2023-08-24 NOTE — Therapy (Signed)
 OUTPATIENT PHYSICAL THERAPY NEURO TREATMENT   Patient Name: Tracey Perkins MRN: [id number] DOB:[birth year], 38 y.o., female Today's Date: 08/25/2023   PCP: Janey Santos, MD REFERRING PROVIDER: Irving Delinda ORN, MD  END OF SESSION:  PT End of Session - 08/24/23 1156     Visit Number 5    Number of Visits 13    Date for PT Re-Evaluation 09/15/23    Authorization Type [place] [employer]    Authorization Time Period 08/08/2023-10/06/2023    Authorization - Visit Number 4    Authorization - Number of Visits 5    PT Start Time 1153    PT Stop Time 1231    PT Time Calculation (min) 38 min    Equipment Utilized During Treatment Gait belt    Activity Tolerance Patient tolerated treatment well    Behavior During Therapy WFL for tasks assessed/performed             Past Medical History:  Diagnosis Date   Asthma    Brain tumor (HCC)    Diabetes mellitus without complication (HCC)    Eczema    Food allergy    Gastroparesis    History of CVA (cerebrovascular accident) 09/03/2020   Hypokalemia    Hypomagnesemia    Hypothyroid    Intractable nausea and vomiting 07/12/2021   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   Mild cognitive impairment 09/04/2020   Neuropathy    Post concussion syndrome 05/2019   Vision changes    Past Surgical History:  Procedure Laterality Date   BRAIN SURGERY     Portacath placement and removed     WISDOM TOOTH EXTRACTION     Patient Active Problem List   Diagnosis Date Noted   Acute respiratory failure with hypoxia (HCC) 12/18/2022   Class 1 obesity 12/18/2022   Multifocal pneumonia 12/18/2022   Hyperlipidemia 12/18/2022   Colitis 12/22/2021   Tachycardia 07/12/2021   Intractable nausea and vomiting 07/12/2021   Diarrhea 07/12/2021   Diabetic neuropathy (HCC) 07/12/2021   Hypothyroidism 07/12/2021   CVA (cerebral vascular accident) (HCC) 09/16/2020   Mild cognitive impairment 09/04/2020   Diabetic gastroparesis (HCC) 09/04/2020    History of CVA (cerebrovascular accident) 09/03/2020   Diabetic polyneuropathy associated with type 1 diabetes mellitus (HCC) 05/15/2020   Post concussive syndrome 05/15/2020   Insulin  pump status 04/02/2020   Impulsiveness 10/14/2019   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   Mood disorder (HCC) 08/27/2019   Anxiety 08/16/2019   Polyneuropathy 06/19/2019   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   Status post radiation therapy 11/22/2018   Seasonal and perennial allergic rhinitis 11/22/2018   Seasonal allergic conjunctivitis 11/22/2018   Acute sinusitis 11/22/2018   Keratosis pilaris 10/05/2017   Melanocytic nevi of trunk 10/05/2017   Adverse food reaction 02/09/2017   Gastroesophageal reflux disease 02/09/2017   Mild intermittent asthma with acute exacerbation 11/19/2015   Mild persistent asthma without complication 11/16/2015   Acute seasonal allergic rhinitis due to pollen 11/16/2015   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   Type 1 diabetes (HCC) 11/16/2015   Dry mouth 11/16/2015   Xerosis cutis 11/16/2015   Nystagmus 08/15/2014   Visual field defect 08/15/2014   Disequilibrium 07/25/2014   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 Pt  accompanied by: family member, father  PERTINENT HISTORY: medical history significant of asthma, brain tumor as a child medulloblastoma, history of TBI as an adult, history of CVA, mild cognitive impairment, postconcussion syndrome, vision changes, type 1 diabetes, diabetic gastroparesis, intractable nausea and vomiting, diabetic peripheral neuropathy, eczema, seasonal allergies, food allergy,hypothyroidism, OSA on CPAP, neuropathy pain  PAIN:  Are you having pain? no  PRECAUTIONS: Fall and Other: HOH    Vision/depth perception problems RED FLAGS: None   WEIGHT BEARING RESTRICTIONS: No  FALLS: Has patient fallen in last 6 months? Yes. Number of falls multiple  LIVING ENVIRONMENT: Lives with: lives with their family Lives in: House/apartment Stairs: Yes: Internal: flight steps; rails Has following equipment at home: [name], Wheelchair (manual), and no walker in the house  PLOF: independent with device; enjoys writing poetry and short stories; likes walking around [employer] and some trails  PATIENT GOALS: To help with my balance, to help my knee feel better  OBJECTIVE:   Pt reports she has difficulty crossing her legs due to stiffness/tightness and she would like to be able to do this  TODAY'S TREATMENT: 08/24/2023 Activity Comments  Seated marching x 10 reps   Attempted seated hip/knee flexion and external rotation limitations  Supine SKTC with use of strap 2 x 20 sec-cues for gentle stretch  Bridging 5 reps   Sit to stand, 2 x 5 with weighted ball   Standing feet apart/feet together with EO/EC, 30 sec x 2 reps    HOME EXERCISE PROGRAM Access Code: [email or web address] URL: [email or web address] Date: 08/24/2023 Prepared by: [facility] - [facility]  Program Notes Sit at edge of your bed-gently bend and straighten your knee, 10 times.  Relax and repeat  Exercises - Supine Heel Slide  - 1 x daily - 7 x weekly - 1-2 sets - 10  reps - Supine Quad Set  - 1 x daily - 7 x weekly - 1-2 sets - 10 reps - 3 sec hold - Seated Hamstring Curl with Anchored Resistance  - 1 x daily - 7 x weekly - 3 sets - 10 reps -  Squat with Chair Touch  - 1 x daily - 5 x weekly - 2 sets - 10 reps - Heel Toe Raises with Counter Support  - 1 x daily - 5 x weekly - 2 sets - 10 reps - Standing Hip Abduction with Counter Support  - 1 x daily - 5 x weekly - 2 sets - 10 reps - Supine Bridge  - 1 x daily - 7 x weekly - 2 sets - 10 reps - Hooklying Single Knee to Chest Stretch  - 1 x daily - 7 x weekly - 1 sets - 3 reps          PATIENT EDUCATION: Education details: Updates to HEP Person educated: Patient and Parent Education method: Explanation, Demonstration, Tactile cues, Verbal cues, and Handouts Education comprehension: verbalized understanding and returned demonstration      Note: Objective measures were completed at Evaluation unless otherwise noted.  DIAGNOSTIC FINDINGS: per pt/mom-x-rays negative  COGNITION: Overall cognitive status: History of cognitive impairments - at baseline and pt reports slower processing   SENSATION: Light touch: WFL  COORDINATION: Slowed alternating taps  PALPATION:  Tender to palpation L > R knee at Lateral, posterior aspect of patella  Negative for ligamentous laxity or pain in ant/post or lateral directions  Decreased patellar motion L knee  LOWER EXTREMITY ROM:     Active  Right Eval Left Eval  Hip flexion    Hip extension    Hip abduction    Hip adduction    Hip internal rotation    Hip external rotation    Knee flexion (sitting) 114 110  Knee extension    Ankle dorsiflexion    Ankle plantarflexion    Ankle inversion    Ankle eversion     (Blank rows = not tested)  LOWER EXTREMITY MMT:    MMT Right Eval Left Eval  Hip flexion 4+ 3+  Hip extension    Hip abduction    Hip adduction    Hip internal rotation    Hip external rotation    Knee flexion 4+ 4  Knee  extension 4+ 4  Ankle dorsiflexion 4+ 4  Ankle plantarflexion    Ankle inversion    Ankle eversion    (Blank rows = not tested)   TRANSFERS: Sit to stand: Modified independence  Assistive device utilized: None     Stand to sit: Modified independence  Assistive device utilized: None      STAIRS: Findings: Level of Assistance: SBA, Stair Negotiation Technique: Step to Pattern Alternating Pattern  with Bilateral Rails, Number of Stairs: 2-3, Height of Stairs: 4-6   , and Comments: step-through pattern ascending, step-to pattern descending GAIT: Findings: Gait Characteristics: keeps L knee slightly flexed and L foot externally rotated, step through pattern, antalgic, and wide BOS, Distance walked: 50 ft, Assistive device utilized:Walker - 4 wheeled, and Level of assistance: SBA  Pt does report sometimes catching L foot on walker.  FUNCTIONAL TESTS:  5 times sit to stand: 22.72 with slight increase in L knee pain Timed up and go (TUG): 17.97 sec 10 meter walk test: 14.41 sec = 2.28 ft/sec with rollator  TREATMENT DATE: 08/08/2023      GOALS: Goals reviewed with patient? Yes  SHORT TERM GOALS: Target date: 08/18/2023  Pt will be independent with HEP for improved strength, flexibility, balance. Baseline:  No current HEP Goal status: MET, 08/22/2023  2.  Knee flexion to improve to at least 120 degrees LLE without pain limiting. Baseline: 110 measured in sitting-no pain, 08/22/2023 Goal status: IN PROGRESS   LONG TERM GOALS: Target date: 09/15/2023  Pt will be independent with progression of HEP for improved strength, flexibility, balance, pain. Baseline: No current HEP Goal status: IN PROGRESS  2.  Pt will improve 5x sit<>stand to less than or equal to 15 sec to demonstrate improved functional strength and transfer efficiency. Baseline: 22.72 sec Goal  status: IN PROGRESS  3.  Pt will improve TUG score to less than or equal to 14 sec for decreased fall risk. Baseline: 17.97 sec Goal status: IN PROGRESS  4.  Pt will improve gait velocity to at least 2.62 ft/sec for improved gait efficiency and safety. Baseline: 2.28 ft/sec Goal status: IN PROGRESS  5.  Pt will verbalize plans for continued community fitness upon d/c from PT to maximize gains made in PT. Baseline: No current plans Goal status: IN PROGRESS   ASSESSMENT:  CLINICAL IMPRESSION: Pt presents today with no new complaints; she does feel the foot up brace is helping. Skilled PT session focused on flexibility and strengthening exercises and standing balance. Pt needs intermittent UE support with EC balance on solid ground.  She expresses interest in being able to cross her legs, as this is difficult; she has tightness in external rotation and hip/knee flexion, so added gentle stretch to HEP to address this.  Pt will continue to benefit from skilled PT towards goals for improved functional mobility and decreased fall risk.   OBJECTIVE IMPAIRMENTS: Abnormal gait, decreased balance, decreased mobility, decreased ROM, decreased strength, impaired flexibility, and pain.   ACTIVITY LIMITATIONS: standing, transfers, and locomotion level  PARTICIPATION LIMITATIONS: shopping and community activity  PERSONAL FACTORS: 3+ comorbidities: see PMH above; recent fall with L knee contusion are also affecting patient's functional outcome.   REHAB POTENTIAL: Good  CLINICAL DECISION MAKING: Evolving/moderate complexity  EVALUATION COMPLEXITY: Moderate  PLAN:  PT FREQUENCY: 1-2x/week  PT DURATION: 6 weeks including eval week  PLANNED INTERVENTIONS: 97750- Physical Performance Testing, 97110-Therapeutic exercises, 97530- Therapeutic activity, 97112- Neuromuscular re-education, 97535- Self Care, [id number]- Manual therapy, [phone]- Gait training, Patient/Family education, Balance training, Stair  training, Taping, and Cryotherapy  PLAN FOR NEXT SESSION: Check STG and FTSTS to resubmit for reauth and more visits* HEP progression for strength, balance; work on Nustep if patient can tolerate.  Sit to stand, gentle squats, ankle strengthening; try corner balance exercises (pt does not tolerate head turns well due to nystagmus)   Greig Anon, PT 08/25/23 8:23 AM Phone: [phone] Fax: [phone]  [facility] [address], [address] [place], [place] [address] Phone # [phone] Fax # [phone]

## 2023-08-29 ENCOUNTER — Encounter: Payer: Self-pay | Admitting: Physical Therapy

## 2023-08-29 ENCOUNTER — Ambulatory Visit: Admitting: Physical Therapy

## 2023-08-29 DIAGNOSIS — M25562 Pain in left knee: Secondary | ICD-10-CM | POA: Diagnosis not present

## 2023-08-29 DIAGNOSIS — R2681 Unsteadiness on feet: Secondary | ICD-10-CM

## 2023-08-29 DIAGNOSIS — R2689 Other abnormalities of gait and mobility: Secondary | ICD-10-CM

## 2023-08-29 DIAGNOSIS — M6281 Muscle weakness (generalized): Secondary | ICD-10-CM

## 2023-08-29 NOTE — Therapy (Signed)
 OUTPATIENT PHYSICAL THERAPY NEURO TREATMENT   Patient Name: Tracey Perkins MRN: [id number] DOB:1985/12/08, 38 y.o., female Today's Date: 08/29/2023   PCP: Janey Santos, MD REFERRING PROVIDER: Irving Delinda ORN, MD  END OF SESSION:  PT End of Session - 08/29/23 1101     Visit Number 6    Number of Visits 13    Date for PT Re-Evaluation 09/15/23    Authorization Type [place] Medicaid [employer]    Authorization Time Period 08/08/2023-10/06/2023-reauth request submitted 08/29/2023    Authorization - Visit Number 5    Authorization - Number of Visits 5    PT Start Time 1103    PT Stop Time 1145    PT Time Calculation (min) 42 min    Equipment Utilized During Treatment Gait belt    Activity Tolerance Patient tolerated treatment well    Behavior During Therapy WFL for tasks assessed/performed              Past Medical History:  Diagnosis Date   Asthma    Brain tumor (HCC)    Diabetes mellitus without complication (HCC)    Eczema    Food allergy    Gastroparesis    History of CVA (cerebrovascular accident) 09/03/2020   Hypokalemia    Hypomagnesemia    Hypothyroid    Intractable nausea and vomiting 07/12/2021   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   Mild cognitive impairment 09/04/2020   Neuropathy    Post concussion syndrome 05/2019   Vision changes    Past Surgical History:  Procedure Laterality Date   BRAIN SURGERY     Portacath placement and removed     WISDOM TOOTH EXTRACTION     Patient Active Problem List   Diagnosis Date Noted   Acute respiratory failure with hypoxia (HCC) 12/18/2022   Class 1 obesity 12/18/2022   Multifocal pneumonia 12/18/2022   Hyperlipidemia 12/18/2022   Colitis 12/22/2021   Tachycardia 07/12/2021   Intractable nausea and vomiting 07/12/2021   Diarrhea 07/12/2021   Diabetic neuropathy (HCC) 07/12/2021   Hypothyroidism 07/12/2021   CVA (cerebral vascular accident) (HCC) 09/16/2020   Mild cognitive impairment 09/04/2020    Diabetic gastroparesis (HCC) 09/04/2020   History of CVA (cerebrovascular accident) 09/03/2020   Diabetic polyneuropathy associated with type 1 diabetes mellitus (HCC) 05/15/2020   Post concussive syndrome 05/15/2020   Insulin  pump status 04/02/2020   Impulsiveness 10/14/2019   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   Mood disorder (HCC) 08/27/2019   Anxiety 08/16/2019   Polyneuropathy 06/19/2019   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   Status post radiation therapy 11/22/2018   Seasonal and perennial allergic rhinitis 11/22/2018   Seasonal allergic conjunctivitis 11/22/2018   Acute sinusitis 11/22/2018   Keratosis pilaris 10/05/2017   Melanocytic nevi of trunk 10/05/2017   Adverse food reaction 02/09/2017   Gastroesophageal reflux disease 02/09/2017   Mild intermittent asthma with acute exacerbation 11/19/2015   Mild persistent asthma without complication 11/16/2015   Acute seasonal allergic rhinitis due to pollen 11/16/2015   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   Type 1 diabetes (HCC) 11/16/2015   Dry mouth 11/16/2015   Xerosis cutis 11/16/2015   Nystagmus 08/15/2014   Visual field defect 08/15/2014   Disequilibrium 07/25/2014   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    ONSET DATE: 07/19/2023 (MD referral)  REFERRING DIAG:  LEFT KNEE CONTUSION, HISTORY OF STROKE  WITH LEFT SIDEED WEAKNESS, HX OF BRAIN TUMOR   THERAPY DIAG:  Muscle weakness (generalized)  Other abnormalities of gait and mobility  Unsteadiness on feet  Rationale for Evaluation and Treatment: Rehabilitation  SUBJECTIVE:                                                                                                                                                                                             SUBJECTIVE STATEMENT: 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  Saw the doctor yesterday and the left knee is good.  Am having some R knee pain in the past few days. Very tired today after the camp this weekend. Pt accompanied by: family member, father  PERTINENT HISTORY: medical history significant of asthma, brain tumor as a child medulloblastoma, history of TBI as an adult, history of CVA, mild cognitive impairment, postconcussion syndrome, vision changes, type 1 diabetes, diabetic gastroparesis, intractable nausea and vomiting, diabetic peripheral neuropathy, eczema, seasonal allergies, food allergy,hypothyroidism, OSA on CPAP, neuropathy pain  PAIN:  Are you having pain? Yes: NPRS scale: 4/10 Pain location: R knee Pain description: muscle cramping/burning Aggravating factors: just in the past few days Relieving factors: medicine, soaking it  PRECAUTIONS: Fall and Other: HOH    Vision/depth perception problems RED FLAGS: None   WEIGHT BEARING RESTRICTIONS: No  FALLS: Has patient fallen in last 6 months? Yes. Number of falls multiple  LIVING ENVIRONMENT: Lives with: lives with their family Lives in: House/apartment Stairs: Yes: Internal: flight steps; rails Has following equipment at home: [name], Wheelchair (manual), and no walker in the house  PLOF: independent with device; enjoys writing poetry and short stories; likes walking around [employer] and some trails  PATIENT GOALS: To help with my balance, to help my knee feel better  OBJECTIVE:    TODAY'S TREATMENT: 08/24/2023 Activity Comments  FTSTS 17 sec Improved from 22.72 sec  TUG  16 sec, 13.81 sec Improved from 17.97 sec     HEP review Good return demo  Sidestepping along counter x 2 minutes BUE support, cues for foot clearance  Forward/back walking x 3 reps   Romberg stance and partial tandem stance EO 15 sec CGA, increased trunk sway    HOME EXERCISE PROGRAM Access Code: [email or web address] URL:  [email or web address] Date: 08/24/2023 Prepared by: [facility] - [facility]  Program Notes Sit at edge of your bed-gently bend and straighten your knee, 10 times.  Relax and repeat  Exercises - Supine Heel Slide  - 1 x daily - 7 x weekly - 1-2 sets -  10 reps - Supine Quad Set  - 1 x daily - 7 x weekly - 1-2 sets - 10 reps - 3 sec hold - Seated Hamstring Curl with Anchored Resistance  - 1 x daily - 7 x weekly - 3 sets - 10 reps - Squat with Chair Touch  - 1 x daily - 5 x weekly - 2 sets - 10 reps - Heel Toe Raises with Counter Support  - 1 x daily - 5 x weekly - 2 sets - 10 reps - Standing Hip Abduction with Counter Support  - 1 x daily - 5 x weekly - 2 sets - 10 reps - Supine Bridge  - 1 x daily - 7 x weekly - 2 sets - 10 reps - Hooklying Single Knee to Chest Stretch  - 1 x daily - 7 x weekly - 1 sets - 3 reps          PATIENT EDUCATION: Education details: Progress towards goals, POC Person educated: Patient and Parent Education method: Explanation, Demonstration, Tactile cues, Verbal cues, and Handouts Education comprehension: verbalized understanding and returned demonstration      Note: Objective measures were completed at Evaluation unless otherwise noted.  DIAGNOSTIC FINDINGS: per pt/mom-x-rays negative  COGNITION: Overall cognitive status: History of cognitive impairments - at baseline and pt reports slower processing   SENSATION: Light touch: WFL  COORDINATION: Slowed alternating taps  PALPATION:  Tender to palpation L > R knee at Lateral, posterior aspect of patella  Negative for ligamentous laxity or pain in ant/post or lateral directions  Decreased patellar motion L knee  LOWER EXTREMITY ROM:     Active  Right Eval Left Eval  Hip flexion    Hip extension    Hip abduction    Hip adduction    Hip internal rotation    Hip external rotation    Knee flexion (sitting) 114 110  Knee extension    Ankle  dorsiflexion    Ankle plantarflexion    Ankle inversion    Ankle eversion     (Blank rows = not tested)  LOWER EXTREMITY MMT:    MMT Right Eval Left Eval  Hip flexion 4+ 3+  Hip extension    Hip abduction    Hip adduction    Hip internal rotation    Hip external rotation    Knee flexion 4+ 4  Knee extension 4+ 4  Ankle dorsiflexion 4+ 4  Ankle plantarflexion    Ankle inversion    Ankle eversion    (Blank rows = not tested)   TRANSFERS: Sit to stand: Modified independence  Assistive device utilized: None     Stand to sit: Modified independence  Assistive device utilized: None      STAIRS: Findings: Level of Assistance: SBA, Stair Negotiation Technique: Step to Pattern Alternating Pattern  with Bilateral Rails, Number of Stairs: 2-3, Height of Stairs: 4-6   , and Comments: step-through pattern ascending, step-to pattern descending GAIT: Findings: Gait Characteristics: keeps L knee slightly flexed and L foot externally rotated, step through pattern, antalgic, and wide BOS, Distance walked: 50 ft, Assistive device utilized:Walker - 4 wheeled, and Level of assistance: SBA  Pt does report sometimes catching L foot on walker.  FUNCTIONAL TESTS:  5 times sit to stand: 22.72 with slight increase in L knee pain Timed up and go (TUG): 17.97 sec 10 meter walk test: 14.41 sec = 2.28 ft/sec with rollator  TREATMENT DATE: 08/08/2023      GOALS: Goals reviewed with patient? Yes  SHORT TERM GOALS: Target date: 08/18/2023  Pt will be independent with HEP for improved strength, flexibility, balance. Baseline:  No current HEP Goal status: MET, 08/22/2023  2.  Knee flexion to improve to at least 120 degrees LLE without pain limiting. Baseline: 110 measured in sitting-no pain, 08/22/2023; 125 degrees supine LLE Goal status: MET, 08/29/2023   LONG TERM GOALS:  Target date: 09/15/2023  Pt will be independent with progression of HEP for improved strength, flexibility, balance, pain. Baseline: No current HEP Goal status: IN PROGRESS  2.  Pt will improve 5x sit<>stand to less than or equal to 15 sec to demonstrate improved functional strength and transfer efficiency. Baseline: 22.72 sec>17 sec 08/29/2023 Goal status: IN PROGRESS, 08/29/2023  3.  Pt will improve TUG score to less than or equal to 14 sec for decreased fall risk. Baseline: 17.97 sec>16 sec/13.68 sec 08/29/2023 Goal status: IN PROGRESS, 08/29/2023  4.  Pt will improve gait velocity to at least 2.62 ft/sec for improved gait efficiency and safety. Baseline: 2.28 ft/sec Goal status: IN PROGRESS  5.  Pt will verbalize plans for continued community fitness upon d/c from PT to maximize gains made in PT. 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  Assessed some objective measures, with pt meeting both STGS-she has 125 degrees of L knee flexion in supine, no pain today. She is progressing towards LTGs with TUG score improving and FTSTS score improving to 17 sec.  She is improving, but she remains at fall risk; she will continue to benefit from skilled PT towards goals for improved functional mobility and balance as well as decreased fall risk.   OBJECTIVE IMPAIRMENTS: Abnormal gait, decreased balance, decreased mobility, decreased ROM, decreased strength, impaired flexibility, and pain.   ACTIVITY LIMITATIONS: standing, transfers, and locomotion level  PARTICIPATION LIMITATIONS: shopping and community activity  PERSONAL FACTORS: 3+ comorbidities: see PMH above; recent fall with L knee contusion are also affecting patient's functional outcome.   REHAB POTENTIAL: Good  CLINICAL DECISION MAKING: Evolving/moderate complexity  EVALUATION COMPLEXITY:  Moderate  PLAN:  PT FREQUENCY: 1-2x/week  PT DURATION: 6 weeks including eval week  PLANNED INTERVENTIONS: 97750- Physical Performance Testing, 97110-Therapeutic exercises, 97530- Therapeutic activity, 97112- Neuromuscular re-education, 97535- Self Care, 02859- Manual therapy, [id number]- Gait training, Patient/Family education, Balance training, Stair training, Taping, and Cryotherapy  PLAN FOR NEXT SESSION: HEP progression for strength, balance; work on Nustep if patient can tolerate.  Sit to stand, gentle squats, ankle strengthening; try corner balance exercises (pt does not tolerate head turns well due to nystagmus)   Greig Anon, PT 08/29/23 11:55 AM Phone: [phone] Fax: [phone]  [facility] at [facility] [address], [address] [place], [place] [address] Phone # [phone] Fax # [phone]  For all possible CPT codes, reference the Planned Interventions line above.     Check all conditions that are expected to impact treatment: {Conditions expected to impact treatment:Neurological condition and/or seizures   If treatment provided at initial evaluation, no treatment charged due to lack of authorization.
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
## 2023-09-05 ENCOUNTER — Telehealth (HOSPITAL_COMMUNITY): Payer: Self-pay | Admitting: Psychiatry

## 2023-09-05 ENCOUNTER — Ambulatory Visit (INDEPENDENT_AMBULATORY_CARE_PROVIDER_SITE_OTHER): Admitting: Psychiatry

## 2023-09-05 VITALS — BP 127/87 | HR 110 | Wt 190.0 lb

## 2023-09-05 DIAGNOSIS — F419 Anxiety disorder, unspecified: Secondary | ICD-10-CM

## 2023-09-05 DIAGNOSIS — F330 Major depressive disorder, recurrent, mild: Secondary | ICD-10-CM

## 2023-09-05 MED ORDER — CITALOPRAM HYDROBROMIDE 10 MG PO TABS
10.0000 mg | ORAL_TABLET | Freq: Every day | ORAL | 2 refills | Status: DC
Start: 2023-09-05 — End: 2023-11-28

## 2023-09-05 NOTE — Addendum Note (Signed)
 Addended by: CARVIN CROCK on: 09/05/2023 12:50 PM   Modules accepted: Level of Service

## 2023-09-05 NOTE — Telephone Encounter (Signed)
 Received message from front desk that patient requested to schedule a virtual visit. This had not been discussed during prior appointment.  Called patient and discussed with patient the limitations of virtual visits, including but not limited to inability to obtain vital signs and perform a physical examination, potential for poor or unstable internet connection, and possibility of needing to reschedule if technical difficulties interfere with clinical care. Reviewed expectations for virtual visits, including requirement that patient must be physically located in [place]  at the time of the appointment, and the understanding that certain issues may necessitate conversion to an in-person visit for adequate evaluation and management.  Patient expressed understanding of these limitations, agreed to proceed with virtual visit under these conditions, and was informed of the possibility of needing in-person follow-up in the future.  Patient provided verbal consent to participate in telehealth services, including acknowledgment of risks, benefits, and alternatives.

## 2023-09-07 ENCOUNTER — Ambulatory Visit: Admitting: Physical Therapy

## 2023-09-14 ENCOUNTER — Ambulatory Visit: Admitting: Physical Therapy

## 2023-09-14 DIAGNOSIS — M6281 Muscle weakness (generalized): Secondary | ICD-10-CM

## 2023-09-14 DIAGNOSIS — R2689 Other abnormalities of gait and mobility: Secondary | ICD-10-CM

## 2023-09-14 DIAGNOSIS — M25562 Pain in left knee: Secondary | ICD-10-CM | POA: Diagnosis not present

## 2023-09-14 DIAGNOSIS — R2681 Unsteadiness on feet: Secondary | ICD-10-CM

## 2023-09-14 NOTE — Therapy (Signed)
 OUTPATIENT PHYSICAL THERAPY NEURO TREATMENT/DISCHARGE SUMMARY   Patient Name: Tracey Perkins MRN: [id number] DOB:1985-07-19, 38 y.o., female Today's Date: 09/14/2023   PCP: Avva, Ravisankar, MD REFERRING PROVIDER: Irving Delinda ORN, MD   PHYSICAL THERAPY DISCHARGE SUMMARY  Visits from Start of Care: 7  Current functional level related to goals / functional outcomes: Pt has met all LTGs-see below   Remaining deficits: balance   Education / Equipment: HEP progression   Patient agrees to discharge. Patient goals were met. Patient is being discharged due to being pleased with the current functional level.  END OF SESSION:  PT End of Session - 09/14/23 1106     Visit Number 7    Number of Visits 13    Date for PT Re-Evaluation 09/15/23    Authorization Type  Medicaid [employer]    Authorization Time Period 08/08/2023-10/06/2023-reauth request submitted 08/29/2023    PT Start Time 1103    PT Stop Time 1145    PT Time Calculation (min) 42 min    Equipment Utilized During Treatment Gait belt    Activity Tolerance Patient tolerated treatment well    Behavior During Therapy WFL for tasks assessed/performed               Past Medical History:  Diagnosis Date   Asthma    Brain tumor (HCC)    Diabetes mellitus without complication (HCC)    Eczema    Food allergy    Gastroparesis    History of CVA (cerebrovascular accident) 09/03/2020   Hypokalemia    Hypomagnesemia    Hypothyroid    Intractable nausea and vomiting 07/12/2021   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   Mild cognitive impairment 09/04/2020   Neuropathy    Post concussion syndrome 05/2019   Vision changes    Past Surgical History:  Procedure Laterality Date   BRAIN SURGERY     Portacath placement and removed     WISDOM TOOTH EXTRACTION     Patient Active Problem List   Diagnosis Date Noted   Acute respiratory failure with hypoxia (HCC) 12/18/2022   Class 1 obesity 12/18/2022   Multifocal  pneumonia 12/18/2022   Hyperlipidemia 12/18/2022   Colitis 12/22/2021   Tachycardia 07/12/2021   Intractable nausea and vomiting 07/12/2021   Diarrhea 07/12/2021   Diabetic neuropathy (HCC) 07/12/2021   Hypothyroidism 07/12/2021   CVA (cerebral vascular accident) (HCC) 09/16/2020   Mild cognitive impairment 09/04/2020   Diabetic gastroparesis (HCC) 09/04/2020   History of CVA (cerebrovascular accident) 09/03/2020   Diabetic polyneuropathy associated with type 1 diabetes mellitus (HCC) 05/15/2020   Post concussive syndrome 05/15/2020   Insulin  pump status 04/02/2020   Impulsiveness 10/14/2019   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   Mood disorder (HCC) 08/27/2019   Anxiety 08/16/2019   Polyneuropathy 06/19/2019   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   Status post radiation therapy 11/22/2018   Seasonal and perennial allergic rhinitis 11/22/2018   Seasonal allergic conjunctivitis 11/22/2018   Acute sinusitis 11/22/2018   Keratosis pilaris 10/05/2017   Melanocytic nevi of trunk 10/05/2017   Adverse food reaction 02/09/2017   Gastroesophageal reflux disease 02/09/2017   Mild intermittent asthma with acute exacerbation 11/19/2015   Mild persistent asthma without complication 11/16/2015   Acute seasonal allergic rhinitis due to pollen 11/16/2015   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   Type 1 diabetes (HCC) 11/16/2015  Dry mouth 11/16/2015   Xerosis cutis 11/16/2015   Nystagmus 08/15/2014   Visual field defect 08/15/2014   Disequilibrium 07/25/2014   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  Really would like to talk about working on my balance at home and finishing therapy today.  Pt accompanied by: family member  PERTINENT HISTORY: medical history significant of asthma, brain tumor as a child medulloblastoma, history of TBI as an adult, history of CVA, mild cognitive impairment, postconcussion syndrome, vision changes, type 1 diabetes, diabetic gastroparesis, intractable nausea and vomiting, diabetic peripheral neuropathy, eczema, seasonal allergies, food allergy,hypothyroidism, OSA on CPAP, neuropathy pain  PAIN:  Are you having pain? No  PRECAUTIONS: Fall and Other: HOH    Vision/depth perception problems RED FLAGS: None   WEIGHT BEARING RESTRICTIONS: No  FALLS: Has patient fallen in last 6 months? Yes. Number of falls multiple  LIVING ENVIRONMENT: Lives with: lives with their family Lives in: House/apartment Stairs: Yes: Internal: flight steps; rails Has following equipment at home: [name], Wheelchair (manual), and no walker in the house  PLOF: independent with device; enjoys writing poetry and short stories; likes walking around [employer] and some trails  PATIENT GOALS: To help with my balance, to help my knee feel better  OBJECTIVE:    TODAY'S TREATMENT: 09/14/2023 Activity Comments  FTSTS:  13.9 sec   TUG: 10.87 sec   Gait velocity:  9.47 sec = 3.46 ft/sec   Verbal review of HEP   Forward/back walking along counter Re-adjustment to foot up brace-better heelstrike  Standing hip extension x 10 reps   Gait training with cane (small tip quad) Cues for sequencing-pt tends to get off sequence        HOME EXERCISE PROGRAM  Access Code: [email or web address] URL: [email or web address] Date: 09/14/2023 Prepared by: [facility] - Outpatient  Rehab - [facility]  Program Notes Sit at edge of your bed-gently bend and straighten your knee, 10 times.  Relax and repeat  Exercises - Seated Hamstring Curl with Anchored Resistance  - 1 x  daily - 7 x weekly - 3 sets - 10 reps - Squat with Chair Touch  - 1 x daily - 5 x weekly - 2 sets - 10 reps - Heel Toe Raises with Counter Support  - 1 x daily - 5 x weekly - 2 sets - 10 reps - Standing Hip Abduction with Counter Support  - 1 x daily - 5 x weekly - 2 sets - 10 reps - Supine Bridge  - 1 x daily - 7 x weekly - 2 sets - 10 reps - Hooklying Single Knee to Chest Stretch  - 1 x daily - 7 x weekly - 1 sets - 3 reps - Backward Walking with Counter Support  - 1 x daily - 5 x weekly - 1 sets - 5 reps - Standing Hip Extension with Counter Support  - 1 x daily - 7 x weekly - 3 sets - 10 reps            PATIENT EDUCATION: Education details: Progress towards goals, POC and PT/pt/mom in agreement for discharge today; discussed balance and posture, adjustment to foot up brace; discussed/provided updates to HEP Person educated: Patient and Parent Education method: Explanation, Demonstration, Tactile cues, Verbal cues, and Handouts Education comprehension: verbalized understanding and returned demonstration      Note: Objective measures were completed at Evaluation unless otherwise noted.  DIAGNOSTIC FINDINGS: per pt/mom-x-rays negative  COGNITION: Overall cognitive status: History of cognitive impairments - at baseline and pt reports slower processing   SENSATION: Light touch: WFL  COORDINATION: Slowed alternating taps  PALPATION:  Tender to palpation L > R knee at Lateral, posterior aspect of patella  Negative for ligamentous laxity or pain in ant/post or lateral directions  Decreased patellar motion L knee  LOWER  EXTREMITY ROM:     Active  Right Eval Left Eval  Hip flexion    Hip extension    Hip abduction    Hip adduction    Hip internal rotation    Hip external rotation    Knee flexion (sitting) 114 110  Knee extension    Ankle dorsiflexion    Ankle plantarflexion    Ankle inversion    Ankle eversion     (Blank rows = not tested)  LOWER EXTREMITY MMT:    MMT Right Eval Left Eval  Hip flexion 4+ 3+  Hip extension    Hip abduction    Hip adduction    Hip internal rotation    Hip external rotation    Knee flexion 4+ 4  Knee extension 4+ 4  Ankle dorsiflexion 4+ 4  Ankle plantarflexion    Ankle inversion    Ankle eversion    (Blank rows = not tested)   TRANSFERS: Sit to stand: Modified independence  Assistive device utilized: None     Stand to sit: Modified independence  Assistive device utilized: None      STAIRS: Findings: Level of Assistance: SBA, Stair Negotiation Technique: Step to Pattern Alternating Pattern  with Bilateral Rails, Number of Stairs: 2-3, Height of Stairs: 4-6   , and Comments: step-through pattern ascending, step-to pattern descending GAIT: Findings: Gait Characteristics: keeps L knee slightly flexed and L foot externally rotated, step through pattern, antalgic, and wide BOS, Distance walked: 50 ft, Assistive device utilized:Walker - 4 wheeled, and Level of assistance: SBA  Pt does report sometimes catching L foot on walker.  FUNCTIONAL TESTS:  5 times sit to stand: 22.72 with slight increase in L knee pain Timed  up and go (TUG): 17.97 sec 10 meter walk test: 14.41 sec = 2.28 ft/sec with rollator                                                                                                                               TREATMENT DATE: 08/08/2023      GOALS: Goals reviewed with patient? Yes  SHORT TERM GOALS: Target date: 08/18/2023  Pt will be independent with HEP for improved strength, flexibility, balance. Baseline:  No current HEP Goal  status: MET, 08/22/2023  2.  Knee flexion to improve to at least 120 degrees LLE without pain limiting. Baseline: 110 measured in sitting-no pain, 08/22/2023; 125 degrees supine LLE Goal status: MET, 08/29/2023   LONG TERM GOALS: Target date: 09/15/2023  Pt will be independent with progression of HEP for improved strength, flexibility, balance, pain. Baseline:  Goal status: MET, 09/14/2023  2.  Pt will improve 5x sit<>stand to less than or equal to 15 sec to demonstrate improved functional strength and transfer efficiency. Baseline: 22.72 sec>17 sec 08/29/2023>13.9 sec  09/14/2023 Goal status: MET 09/14/2023  3.  Pt will improve TUG score to less than or equal to 14 sec for decreased fall risk. Baseline: 17.97 sec>16 sec/13.68 sec 08/29/2023>10.81 sec 09/14/2023 Goal status: MET 09/14/2023  4.  Pt will improve gait velocity to at least 2.62 ft/sec for improved gait efficiency and safety. Baseline: 2.28 ft/sec, 3.46 ft/sec 09/14/2023 Goal status: MET, 09/14/2023  5.  Pt will verbalize plans for continued community fitness upon d/c from PT to maximize gains made in PT. Baseline: Discussed options for [employer], where she has a membership Goal status: MET, 09/11/2023   ASSESSMENT:  CLINICAL IMPRESSION: Pt presents today and reports overall improvements in knee pain, no falls.  She and mom report she feels good about her current HEP and would like to discharge today/continue her exercises at home.  Skilled PT session focused on assessing goals and pt has met all 6 of 6 LTGs.  Updated HEP to include several more balance/hip stability exercises and pt return demo understanding.  She is pleased with current functional level and she is in agreement with discharge this visit.    OBJECTIVE IMPAIRMENTS: Abnormal gait, decreased balance, decreased mobility, decreased ROM, decreased strength, impaired flexibility, and pain.   ACTIVITY LIMITATIONS: standing, transfers, and locomotion  level  PARTICIPATION LIMITATIONS: shopping and community activity  PERSONAL FACTORS: 3+ comorbidities: see PMH above; recent fall with L knee contusion are also affecting patient's functional outcome.   REHAB POTENTIAL: Good  CLINICAL DECISION MAKING: Evolving/moderate complexity  EVALUATION COMPLEXITY: Moderate  PLAN:  PT FREQUENCY: 1-2x/week  PT DURATION: 6 weeks including eval week  PLANNED INTERVENTIONS: 97750- Physical Performance Testing, 97110-Therapeutic exercises, 97530- Therapeutic activity, [id number]- Neuromuscular re-education, 97535- Self Care, 02859- Manual therapy, [phone]- Gait training, Patient/Family education, Balance training, Stair training, Taping, and Cryotherapy  PLAN FOR NEXT SESSION: Discharge PT this visit.   Greig Anon, PT  09/14/23 2:08 PM Phone: [phone] Fax: [phone]  [facility] at [facility] [address] [place], [place] [address] Phone # [phone] Fax # [phone]  For all possible CPT codes, reference the Planned Interventions line above.     Check all conditions that are expected to impact treatment: {Conditions expected to impact treatment:Neurological condition and/or seizures   If treatment provided at initial evaluation, no treatment charged due to lack of authorization.

## 2023-09-15 ENCOUNTER — Encounter: Payer: Self-pay | Admitting: Family Medicine

## 2023-09-15 NOTE — Telephone Encounter (Signed)
 Called pt to address the challenge and schedule it, but there was document of labs or skin testing being done to the banana. But there was mention in anne note that pt can consider a challenge pending negative result from testing. Pt will back after checking their record of the lab testing.

## 2023-09-20 ENCOUNTER — Ambulatory Visit: Admitting: Internal Medicine

## 2023-09-25 NOTE — Telephone Encounter (Signed)
 Can you please help this patient schedule an in office oral challenge for banana. Please have her bring 2 bananas and remind her to stop antihistamines for three days before the challenge. Thank you

## 2023-09-28 NOTE — Patient Instructions (Incomplete)
 Jazlen was able to tolerate the banana food challenge today at the office without adverse signs or symptoms of an allergic reaction. Therefore, she has the same risk of systemic reaction associated with the consumption of banana products as the general population.  - Do not give any banana products for the next 24 hours. - Monitor for allergic symptoms such as rash, wheezing, diarrhea, swelling, and vomiting for the next 24 hours. If severe symptoms occur, treat with EpiPen  injection and call 911. For less severe symptoms treat with Benadryl  4 teaspoonfuls every 4-6 hours and call the clinic.  - If no allergic symptoms are evident, reintroduce banana products into the diet, 1-2 servings a day. If she develops an allergic reaction to banana products, record what was eaten, the amount eaten, preparation method, time from ingestion to reaction, and symptoms.    Follow-up in 3 to 4 months or sooner if needed

## 2023-09-29 ENCOUNTER — Ambulatory Visit: Admitting: Family

## 2023-09-29 ENCOUNTER — Encounter: Payer: Self-pay | Admitting: Family

## 2023-09-29 VITALS — BP 118/72 | HR 109 | Temp 98.8°F | Resp 20 | Wt 191.0 lb

## 2023-09-29 DIAGNOSIS — T7800XD Anaphylactic reaction due to unspecified food, subsequent encounter: Secondary | ICD-10-CM

## 2023-09-29 DIAGNOSIS — T7800XA Anaphylactic reaction due to unspecified food, initial encounter: Secondary | ICD-10-CM

## 2023-09-29 NOTE — Progress Notes (Signed)
 [address] [name] [address] Dept: [phone]  FOLLOW UP NOTE  Patient ID: Tracey Perkins, female    DOB: 09-Apr-1985  Age: 38 y.o. MRN: [id number] Date of Office Visit: 09/29/2023  Assessment  Chief Complaint: Food/Drug Challenge (Banana)  HPI Tracey Perkins is a 38 year old female who presents today for an oral food challenge to banana.  She was last seen on April 25, 2023 by Arlean Mutter, FNP for mild persistent asthma without complication, seasonal and perennial allergic rhinitis, seasonal allergic conjunctivitis, gastroesophageal reflux disease, allergy with anaphylaxis due to food, and type 1 diabetes mellitus without complication.  She denies any new diagnosis or surgery since her last office visit.  Her boyfriend is here with her today.  She reports that she has been off all antihistamines for the past 3 days and is in good health.  She does later mention that she did not stop azelastine  nasal spray until this morning because that is what our office told her she could do.  She denies any cardiorespiratory, gastrointestinal, or cutaneous symptoms.  She does mention a runny nose and sneezing since being off all  other antihistamines for the past 3 days.  Yesterday she also had itchy eyes.  Opportunity given to ask questions.  Informed consent signed.  She also reports that over the summer she smelled a banana and it smelled good so she ate half a banana slowly and did not have any symptoms.   Drug Allergies:  Allergies  Allergen Reactions   Banana Swelling and Other (See Comments)    Mouth burning and swollen   Bactrim [Sulfamethoxazole-Trimethoprim] Hives   Doxycycline Hives   Gentian Violet Other (See Comments)    Mouth burns   Gentian Violet-Proflavine Sulfate [Triple Dye] Other (See Comments)    Mouth burns   Mold Extract [Trichophyton] Other (See Comments)    Hay Fever   Morphine And Codeine Hives   Other Hives, Itching and Other (See Comments)    Hay fever, dust  and pollen.  Per patient.- itching, runny nose   Sulfa Antibiotics    Vancomycin  Other (See Comments)    Red man syndrome     Review of Systems: Negative except as per HPI   Physical Exam: BP 118/72 (BP Location: Left Arm, Patient Position: Sitting, Cuff Size: Large)   Pulse (!) 109   Temp 98.8 F (37.1 C) (Oral)   Resp 20   Wt 191 lb (86.6 kg)   SpO2 95%   BMI 33.83 kg/m    Physical Exam Constitutional:      Appearance: Normal appearance.  HENT:     Head: Normocephalic and atraumatic.     Comments: Wears hearing aids.  Pharynx normal, eyes normal, ears normal, nose normal    Right Ear: Tympanic membrane, ear canal and external ear normal.     Left Ear: Tympanic membrane, ear canal and external ear normal.     Nose: Nose normal.     Mouth/Throat:     Mouth: Mucous membranes are moist.     Pharynx: Oropharynx is clear.  Eyes:     Conjunctiva/sclera: Conjunctivae normal.  Cardiovascular:     Rate and Rhythm: Regular rhythm.     Heart sounds: Normal heart sounds.  Pulmonary:     Effort: Pulmonary effort is normal.     Breath sounds: Normal breath sounds.     Comments: Lungs clear to auscultation Musculoskeletal:     Cervical back: Neck supple.  Skin:  General: Skin is warm.     Comments: No rashes or urticarial lesions noted  Neurological:     Mental Status: She is alert and oriented to person, place, and time.  Psychiatric:        Mood and Affect: Mood normal.        Behavior: Behavior normal.        Thought Content: Thought content normal.        Judgment: Judgment normal.     Skin testing today is negative to banana with adequate controls  Diagnostics:  Open graded banana oral challenge: The patient was able to tolerate the challenge today without adverse signs or symptoms. Vital signs were stable throughout the challenge and observation period. She received multiple doses separated by 15 minutes, each of which was separated by vitals and a brief  physical exam. She received the following doses: lip rub, 1/12 serving, 1/6 serving, 1/4 serving and 1/2 serving. Goal: 1 banana. She was monitored for 60 minutes following the last dose.   The patient had IgE banana 0.36 and was able to tolerate the open graded oral challenge today without adverse signs or symptoms. Therefore, she has the same risk of systemic reaction associated with the consumption of banana as the general population.  Start time 8:38 AM End time 11:20 PM  Assessment and Plan: 1. Allergy with anaphylaxis due to food     No orders of the defined types were placed in this encounter.   Patient Instructions  Tracey Perkins was able to tolerate the banana food challenge today at the office without adverse signs or symptoms of an allergic reaction. Therefore, she has the same risk of systemic reaction associated with the consumption of banana products as the general population.  - Do not give any banana products for the next 24 hours. - Monitor for allergic symptoms such as rash, wheezing, diarrhea, swelling, and vomiting for the next 24 hours. If severe symptoms occur, treat with EpiPen  injection and call 911. For less severe symptoms treat with Benadryl  4 teaspoonfuls every 4-6 hours and call the clinic.  - If no allergic symptoms are evident, reintroduce banana products into the diet, 1-2 servings a day. If she develops an allergic reaction to banana products, record what was eaten, the amount eaten, preparation method, time from ingestion to reaction, and symptoms.    Follow-up in 3 to 4 months or sooner if needed  Return in about 3 months (around 12/29/2023), or if symptoms worsen or fail to improve.    Thank you for the opportunity to care for this patient.  Please do not hesitate to contact me with questions.  Wanda Craze, FNP [facility] 

## 2023-10-03 ENCOUNTER — Encounter (HOSPITAL_COMMUNITY): Payer: Self-pay

## 2023-10-03 ENCOUNTER — Emergency Department (HOSPITAL_COMMUNITY)
Admission: EM | Admit: 2023-10-03 | Discharge: 2023-10-04 | Disposition: A | Discharge status: Home / Self Care | Attending: Emergency Medicine | Admitting: Emergency Medicine

## 2023-10-03 ENCOUNTER — Emergency Department (HOSPITAL_COMMUNITY)

## 2023-10-03 ENCOUNTER — Other Ambulatory Visit: Payer: Self-pay

## 2023-10-03 DIAGNOSIS — S060X0A Concussion without loss of consciousness, initial encounter: Secondary | ICD-10-CM | POA: Insufficient documentation

## 2023-10-03 DIAGNOSIS — S0990XA Unspecified injury of head, initial encounter: Secondary | ICD-10-CM | POA: Diagnosis present

## 2023-10-03 DIAGNOSIS — Y9389 Activity, other specified: Secondary | ICD-10-CM | POA: Diagnosis not present

## 2023-10-03 DIAGNOSIS — K3184 Gastroparesis: Secondary | ICD-10-CM | POA: Diagnosis not present

## 2023-10-03 DIAGNOSIS — R799 Abnormal finding of blood chemistry, unspecified: Secondary | ICD-10-CM | POA: Insufficient documentation

## 2023-10-03 DIAGNOSIS — W108XXA Fall (on) (from) other stairs and steps, initial encounter: Secondary | ICD-10-CM | POA: Insufficient documentation

## 2023-10-03 DIAGNOSIS — M7989 Other specified soft tissue disorders: Secondary | ICD-10-CM | POA: Insufficient documentation

## 2023-10-03 DIAGNOSIS — S80212A Abrasion, left knee, initial encounter: Secondary | ICD-10-CM | POA: Diagnosis not present

## 2023-10-03 DIAGNOSIS — S0083XA Contusion of other part of head, initial encounter: Secondary | ICD-10-CM | POA: Diagnosis not present

## 2023-10-03 DIAGNOSIS — T07XXXA Unspecified multiple injuries, initial encounter: Secondary | ICD-10-CM

## 2023-10-03 DIAGNOSIS — R110 Nausea: Secondary | ICD-10-CM

## 2023-10-03 LAB — PREGNANCY, URINE: Preg Test, Ur: NEGATIVE

## 2023-10-03 MED ORDER — ONDANSETRON 4 MG PO TBDP
ORAL_TABLET | ORAL | Status: AC
  Filled 2023-10-03: qty 1

## 2023-10-03 MED ORDER — ONDANSETRON 4 MG PO TBDP
4.0000 mg | ORAL_TABLET | Freq: Once | ORAL | Status: AC
  Administered 2023-10-03: 4 mg via ORAL

## 2023-10-03 NOTE — ED Notes (Signed)
 Pt also reports she has Hx of gastroparesis & lately has been having stinky burps & that usually leads up to her having n/v/d.

## 2023-10-03 NOTE — ED Triage Notes (Signed)
 Pt lost her balance and struck her head on brick wall.  No LOC.  Pt takes an aspirin  and has an abrasion to left side of face/forehead.  Pt reports headache

## 2023-10-03 NOTE — ED Provider Triage Note (Signed)
 Emergency Medicine Provider Triage Evaluation Note  Tracey Perkins , a 38 y.o. female  was evaluated in triage.  Pt complains of chemical fall hitting face against the ground approximately 2 hours ago now complaining of frontal headache.  No other symptoms..  Review of Systems  Positive: HA Negative: LOC, BT, seizure, AMS  Physical Exam  BP 125/89   Pulse 89   Temp 97.8 F (36.6 C)   Resp 16   SpO2 96%  Gen:   Awake, no distress   Resp:  Normal effort  MSK:   Moves extremities without difficulty  Other:  Abrasion to forehead  Medical Decision Making  Medically screening exam initiated at 5:24 PM.  Appropriate orders placed.  Tracey Perkins was informed that the remainder of the evaluation will be completed by another provider, this initial triage assessment does not replace that evaluation, and the importance of remaining in the ED until their evaluation is complete.     Tracey Warren SAILOR, PA-C 10/03/23 1725

## 2023-10-04 LAB — CBC WITH DIFFERENTIAL/PLATELET
Abs Immature Granulocytes: 0.05 K/uL (ref 0.00–0.07)
Basophils Absolute: 0 K/uL (ref 0.0–0.1)
Basophils Relative: 0 %
Eosinophils Absolute: 0.1 K/uL (ref 0.0–0.5)
Eosinophils Relative: 1 %
HCT: 38.8 % (ref 36.0–46.0)
Hemoglobin: 13.2 g/dL (ref 12.0–15.0)
Immature Granulocytes: 0 %
Lymphocytes Relative: 16 %
Lymphs Abs: 2 K/uL (ref 0.7–4.0)
MCH: 30.9 pg (ref 26.0–34.0)
MCHC: 34 g/dL (ref 30.0–36.0)
MCV: 90.9 fL (ref 80.0–100.0)
Monocytes Absolute: 0.9 K/uL (ref 0.1–1.0)
Monocytes Relative: 7 %
Neutro Abs: 9.6 K/uL — ABNORMAL HIGH (ref 1.7–7.7)
Neutrophils Relative %: 76 %
Platelets: 318 K/uL (ref 150–400)
RBC: 4.27 MIL/uL (ref 3.87–5.11)
RDW: 13.1 % (ref 11.5–15.5)
WBC: 12.6 K/uL — ABNORMAL HIGH (ref 4.0–10.5)
nRBC: 0 % (ref 0.0–0.2)

## 2023-10-04 LAB — COMPREHENSIVE METABOLIC PANEL WITH GFR
ALT: 22 U/L (ref 0–44)
AST: 23 U/L (ref 15–41)
Albumin: 3.9 g/dL (ref 3.5–5.0)
Alkaline Phosphatase: 79 U/L (ref 38–126)
Anion gap: 10 (ref 5–15)
BUN: 10 mg/dL (ref 6–20)
CO2: 28 mmol/L (ref 22–32)
Calcium: 9 mg/dL (ref 8.9–10.3)
Chloride: 91 mmol/L — ABNORMAL LOW (ref 98–111)
Creatinine, Ser: 1.06 mg/dL — ABNORMAL HIGH (ref 0.44–1.00)
GFR, Estimated: >60 mL/min (ref >60)
Glucose, Bld: 294 mg/dL — ABNORMAL HIGH (ref 70–99)
Potassium: 4.5 mmol/L (ref 3.5–5.1)
Sodium: 129 mmol/L — ABNORMAL LOW (ref 135–145)
Total Bilirubin: 0.3 mg/dL (ref 0.0–1.2)
Total Protein: 7.2 g/dL (ref 6.5–8.1)

## 2023-10-04 LAB — CBG MONITORING, ED: Glucose-Capillary: 282 mg/dL — ABNORMAL HIGH (ref 70–99)

## 2023-10-04 LAB — LIPASE, BLOOD: Lipase: 27 U/L (ref 11–51)

## 2023-10-04 MED ORDER — BACITRACIN ZINC 500 UNIT/GM EX OINT
TOPICAL_OINTMENT | Freq: Two times a day (BID) | CUTANEOUS | Status: DC
  Administered 2023-10-04: 1 via TOPICAL
  Filled 2023-10-04: qty 0.9

## 2023-10-04 MED ORDER — ONDANSETRON 4 MG PO TBDP: 4.0000 mg | ORAL_TABLET | Freq: Three times a day (TID) | ORAL | 0 refills | Status: AC | PRN

## 2023-10-04 MED ORDER — METOCLOPRAMIDE HCL 5 MG/ML IJ SOLN
10.0000 mg | Freq: Once | INTRAMUSCULAR | Status: AC
  Administered 2023-10-04: 10 mg via INTRAVENOUS
  Filled 2023-10-04: qty 2

## 2023-10-04 MED ORDER — LACTATED RINGERS IV BOLUS
1000.0000 mL | Freq: Once | INTRAVENOUS | Status: AC
Start: 2023-10-04 — End: 2023-10-04
  Administered 2023-10-04: 1000 mL via INTRAVENOUS

## 2023-10-04 MED ORDER — FAMOTIDINE IN NACL 20-0.9 MG/50ML-% IV SOLN
20.0000 mg | Freq: Once | INTRAVENOUS | Status: AC
  Administered 2023-10-04: 20 mg via INTRAVENOUS
  Filled 2023-10-04: qty 50

## 2023-10-04 MED ORDER — ONDANSETRON 4 MG PO TBDP
4.0000 mg | ORAL_TABLET | Freq: Once | ORAL | Status: AC
  Administered 2023-10-04: 4 mg via ORAL

## 2023-10-04 NOTE — Discharge Instructions (Signed)
 Please follow-up with your regular doctor, your neurologist, and with the orthopedic group.  Please return for new or worsening symptoms.  Be certain to get plenty of rest.

## 2023-10-04 NOTE — Progress Notes (Signed)
 Orthopedic Tech Progress Note Patient Details:  Tracey Perkins 1985/02/20 [id number] Applied knee immobilizer and gave pt instructions on how to use crutches per order.  Ortho Devices Type of Ortho Device: Crutches, Knee Immobilizer Ortho Device/Splint Location: LLE Ortho Device/Splint Interventions: Ordered, Application, Adjustment   Post Interventions Patient Tolerated: Well Instructions Provided: Adjustment of device, Care of device, Poper ambulation with device  [name] 10/04/2023, 6:26 AM

## 2023-10-04 NOTE — ED Provider Triage Note (Signed)
 Emergency Medicine Provider Triage Evaluation Note  Tracey Perkins , a 38 y.o. female  was evaluated in triage.  Pt complains of patient's imaging back. Was planning on discharging her, but then she noted that her gastroparesis is acting up and she would also like to be seen for this complaint. I have added on labs and patient will be sent back to waiting room.  Review of Systems  Positive:  Negative:   Physical Exam  BP 118/78 (BP Location: Left Arm)   Pulse 98   Temp (!) 97.5 F (36.4 C) (Oral)   Resp (!) 22   SpO2 98%  Gen:   Awake, no distress   Resp:  Normal effort  MSK:   Moves extremities without difficulty  Other:    Medical Decision Making  Medically screening exam initiated at 1:35 AM.  Appropriate orders placed.  Tracey Perkins was informed that the remainder of the evaluation will be completed by another provider, this initial triage assessment does not replace that evaluation, and the importance of remaining in the ED until their evaluation is complete.    Tracey Perkins F, [place] 10/04/23 [phone]

## 2023-10-04 NOTE — ED Notes (Signed)
Attempted PIV, unsuccessful

## 2023-10-04 NOTE — ED Provider Notes (Signed)
 [facility]PT [facility] Emergency Department Provider Note MRN:  [id number]  Arrival date & time: 10/04/23     Chief Complaint   Fall and Head Injury   History of Present Illness   Tracey Perkins is a 38 y.o. year-old female presents to the ED with chief complaint of head injury. States that she was at a car wash and missed a step and fell and hit her head on a brick wall.  Hx of concussion, stroke, and prior brain tumor.  States that she has mild headache now.  States that she has had some photophobia.    She also reports that she has had some nausea, which is consistent with her gastroparesis.  She has not had vomiting.  History provided by patient.   Review of Systems  Pertinent positive and negative review of systems noted in HPI.    Physical Exam   Vitals:   10/04/23 0415 10/04/23 0500  BP: 122/84 (!) 136/98  Pulse: 94 96  Resp:    Temp:    SpO2: 99% 97%    CONSTITUTIONAL:  non toxic-appearing, NAD NEURO:  Alert and oriented x 3, CN 3-12 grossly intact EYES:  eyes equal and reactive ENT/NECK:  Supple, no stridor  CARDIO:  normal rate, regular rhythm, appears well-perfused  PULM:  No respiratory distress, CTAB GI/GU:  non-distended,  MSK/SPINE:  No gross deformities, no edema, moves all extremities  SKIN:  no rash, atraumatic   *Additional and/or pertinent findings included in MDM below  Diagnostic and Interventional Summary    EKG Interpretation Date/Time:    Ventricular Rate:    PR Interval:    QRS Duration:    QT Interval:    QTC Calculation:   R Axis:      Text Interpretation:         Labs Reviewed  CBC WITH DIFFERENTIAL/PLATELET - Abnormal; Notable for the following components:      Result Value   WBC 12.6 (*)    Neutro Abs 9.6 (*)    All other components within normal limits  COMPREHENSIVE METABOLIC PANEL WITH GFR - Abnormal; Notable for the following components:   Sodium 129 (*)    Chloride 91 (*)    Glucose, Bld 294 (*)     Creatinine, Ser 1.06 (*)    All other components within normal limits  CBG MONITORING, ED - Abnormal; Notable for the following components:   Glucose-Capillary 282 (*)    All other components within normal limits  PREGNANCY, URINE  LIPASE, BLOOD    DG Knee Complete 4 Views Left  Final Result    CT Head Wo Contrast  Final Result      Medications  bacitracin  ointment (1 Application Topical Given 10/04/23 0418)  ondansetron  (ZOFRAN -ODT) disintegrating tablet 4 mg (4 mg Oral Given 10/03/23 2248)  ondansetron  (ZOFRAN -ODT) disintegrating tablet 4 mg (4 mg Oral Given 10/04/23 0135)  lactated ringers  bolus 1,000 mL (1,000 mLs Intravenous New Bag/Given 10/04/23 0418)  metoCLOPramide  (REGLAN ) injection 10 mg (10 mg Intravenous Given 10/04/23 0419)  famotidine  (PEPCID ) IVPB 20 mg premix (0 mg Intravenous Stopped 10/04/23 0534)     Procedures  /  Critical Care Procedures  ED Course and Medical Decision Making  I have reviewed the triage vital signs, the nursing notes, and pertinent available records from the EMR.  Social Determinants Affecting Complexity of Care: Patient has no clinically significant social determinants affecting this chief complaint..   ED Course:    Medical Decision Making Patient  here with head injury.  She states that she missed a step while at the carwash and hit her head on a brick wall.  She did not pass out.  She does have a significant abrasion and contusion to her forehead, but no laceration requiring repair.  She has had some photophobia and headache.  CT ordered in triage is reassuring.  She also hit her left knee and has an abrasion on the anterior knee.  Plain films were negative.  She does have some pain with ambulation, so we will give her head knee brace and crutches if needed.  Patient states that she has had some nausea, this could be from the head injury, but she states that it feels more consistent with onset of gastroparesis.  She has not had any  vomiting.  Her mother asks if she can have some fluid.  Patient treated with lactated Ringer 's and metoclopramide .  On reassessment, she states that she is feeling somewhat improved.  We discussed head injury and concussion return precautions.  She has follow-up with her neurologist on Monday.  I have also given her recommendations to follow-up with orthopedics.  Amount and/or Complexity of Data Reviewed Radiology: ordered.  Risk OTC drugs. Prescription drug management.         Consultants: No consultations were needed in caring for this patient.   Treatment and Plan: I considered admission due to patient's initial presentation, but after considering the examination and diagnostic results, patient will not require admission and can be discharged with outpatient follow-up.    Final Clinical Impressions(s) / ED Diagnoses     ICD-10-CM   1. Injury of head, initial encounter  S09.90XA     2. Concussion without loss of consciousness, initial encounter  S06.0X0A     3. Multiple abrasions  T07.XXXA     4. Nausea  R11.0       ED Discharge Orders     None         Discharge Instructions Discussed with and Provided to Patient:     Discharge Instructions      Please follow-up with your regular doctor, your neurologist, and with the orthopedic group.  Please return for new or worsening symptoms.  Be certain to get plenty of rest.       Vicky Charleston, PA-C 10/04/23 0539    Raford Lenis, MD 10/04/23 [phone]

## 2023-10-05 NOTE — Addendum Note (Signed)
 Addended by: WILLIAMS, Sajjad Honea H on: 10/05/2023 12:23 PM   Modules accepted: Orders

## 2023-10-11 ENCOUNTER — Other Ambulatory Visit: Payer: Self-pay | Admitting: *Deleted

## 2023-10-11 ENCOUNTER — Encounter: Payer: Self-pay | Admitting: Family Medicine

## 2023-10-11 MED ORDER — AZELASTINE HCL 0.1 % NA SOLN: 1.0000 | Freq: Two times a day (BID) | NASAL | 5 refills | Status: AC | PRN

## 2023-11-14 ENCOUNTER — Other Ambulatory Visit: Payer: Self-pay | Admitting: Family Medicine
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
## 2023-11-28 ENCOUNTER — Telehealth (INDEPENDENT_AMBULATORY_CARE_PROVIDER_SITE_OTHER): Admitting: Psychiatry

## 2023-11-28 DIAGNOSIS — F3341 Major depressive disorder, recurrent, in partial remission: Secondary | ICD-10-CM | POA: Diagnosis not present

## 2023-11-28 DIAGNOSIS — F411 Generalized anxiety disorder: Secondary | ICD-10-CM | POA: Diagnosis not present

## 2023-11-28 DIAGNOSIS — F419 Anxiety disorder, unspecified: Secondary | ICD-10-CM

## 2023-11-28 MED ORDER — CITALOPRAM HYDROBROMIDE 10 MG PO TABS: 5.0000 mg | ORAL_TABLET | Freq: Every day | ORAL | 2 refills | Status: DC

## 2023-11-29 NOTE — Addendum Note (Signed)
 Addended by: GRAHAM KRABBE on: 11/29/2023 07:51 AM   Modules accepted: Level of Service

## 2023-12-11 ENCOUNTER — Other Ambulatory Visit: Payer: Self-pay | Admitting: Family Medicine

## 2023-12-31 ENCOUNTER — Other Ambulatory Visit (HOSPITAL_COMMUNITY): Payer: Self-pay | Admitting: Psychiatry

## 2023-12-31 DIAGNOSIS — F3341 Major depressive disorder, recurrent, in partial remission: Secondary | ICD-10-CM

## 2024-01-01 NOTE — Telephone Encounter (Signed)
 90 day rx requested, this was approved

## 2024-01-29 NOTE — Progress Notes (Unsigned)
 SABRA

## 2024-01-30 ENCOUNTER — Telehealth: Payer: Medicaid Other | Admitting: Adult Health

## 2024-01-30 DIAGNOSIS — G4733 Obstructive sleep apnea (adult) (pediatric): Secondary | ICD-10-CM

## 2024-01-30 NOTE — Progress Notes (Signed)
 "    PATIENT: Tracey Perkins DOB: May 07, 1985  REASON FOR VISIT: follow up HISTORY FROM: patient PRIMARY NEUROLOGIST: Dr. Buck   Virtual Visit via Video Note  I connected with Tracey Perkins on 01/30/2024 at  1:00 PM EST by a video enabled telemedicine application located remotely at [facility] and verified that I am speaking with the correct person using two identifiers who was located at their own home in [place].   I discussed the limitations of evaluation and management by telemedicine and the availability of in person appointments. The patient expressed understanding and agreed to proceed.   PATIENT: Tracey Perkins DOB: [birth year]  REASON FOR VISIT: follow up HISTORY FROM: patient  HISTORY OF PRESENT ILLNESS: Today 01/30/2024  Tracey Perkins is a 39 y.o. female with a history of OSA on CPAP. Returns today for follow-up.  She reports that CPAP continues to work well for her.  She currently wears the nasal mask.  She does not feel the mask leaking.  She does say that sometimes when she puts it on the pressure feels high. She had home sleep test 07/2022 that showed moderate sleep apnea.  Her download is below      HISTORY 02/28/22:   Tracey Perkins is a 39 y.o. female with a history of OSA on CPAP. Returns today for follow-up.  Reports that CPAP is working well.  Reports that she does not feel that the heated tubing feature is working appropriately.  She was hospitalized at the beginning of December for pneumonia. Her download is below            HISTORY Ms. Hissong is a 39 year old female with an underlying complex medical history of type 1 diabetes, reflux disease, hypothyroidism, medulloblastoma with status post surgery in 1991, history of concussion, history of stroke in August 2022, neuropathy, followed by [facility] neurology, asthma, allergies, cognitive impairment, hearing loss, retinopathy, hyperlipidemia, and obesity, who reports snoring and excessive daytime  somnolence, as well as witnessed apneas per family.  Reports a family history of sleep apnea, her dad has a CPAP machine, paternal uncle also has sleep apnea and paternal grandfather had sleep apnea as well.  She lives with her parents, she is currently pursuing her masters degree.  She does not drive.  She goes to bed around 9 but will be on the computer until about 11.  Rise time is between 10 and 11 AM.  She denies nightly nocturia but has had occasional morning headaches including migraines.  She has a family history of migraines as well.  He drinks caffeine in the form of coffee and soda, usually 1 serving each per day.  She is a non-smoker and does not drink any alcohol .  They have no pets at the house.     REVIEW OF SYSTEMS: Out of a complete 14 system review of symptoms, the patient complains only of the following symptoms, and all other reviewed systems are negative.  ALLERGIES: Allergies[1]  HOME MEDICATIONS: Outpatient Medications Prior to Visit  Medication Sig Dispense Refill   acetaminophen  (TYLENOL ) 500 MG tablet Take 500 mg by mouth as needed for mild pain or headache.     aspirin  EC 81 MG tablet Take 81 mg by mouth daily. Swallow whole.     azelastine  (ASTELIN ) 0.1 % nasal spray Place 1 spray into both nostrils 2 (two) times daily as needed. 30 mL 5   citalopram  (CELEXA ) 10 MG tablet TAKE 1/2 TABLET BY MOUTH DAILY 45  tablet 1   Continuous Blood Gluc Sensor (DEXCOM G6 SENSOR) MISC Apply 1 Device topically See admin instructions. Place 1 sensor into the skin every 10 days Dexcom 7     Continuous Blood Gluc Transmit (DEXCOM G6 TRANSMITTER) MISC Dexcom 7     DERMOTIC 0.01 % OIL Place 4 drops into both ears See admin instructions. Place 4 drops into both ears 2 times a week as needed/as directed     dicyclomine  (BENTYL ) 20 MG tablet Take 1 tablet (20 mg total) by mouth 2 (two) times daily as needed for up to 7 days for spasms. 14 tablet 0   EPINEPHRINE  0.3 mg/0.3 mL IJ SOAJ injection  Use as directed 2 each 1   FLORASTOR 250 MG capsule Take 250 mg by mouth in the morning.     fluticasone  (FLONASE ) 50 MCG/ACT nasal spray 2 SPRAYS PER NOSTRIL DAILY AS NEEDED FOR STUFFY NOSE. 48 mL 1   fluticasone  (FLOVENT  HFA) 110 MCG/ACT inhaler Inhale 2 puffs into the lungs 2 (two) times daily as needed (for asthma flares, until cough and wheeze-free). (Patient taking differently: Inhale 2 puffs into the lungs every 30 (thirty) days.)     Glucagon  3 MG/DOSE POWD Place 3 mg into the nose as needed (low blood sugar).     insulin  aspart (NOVOLOG ) 100 UNIT/ML injection Inject into the skin See admin instructions. Up to 190 units a day per pump     LANTUS SOLOSTAR 100 UNIT/ML Solostar Pen as needed (uses onlyl if her insulin  pump fails).     levothyroxine  (SYNTHROID ) 75 MCG tablet Take 75 mcg by mouth daily before breakfast.     loperamide  (IMODIUM ) 2 MG capsule Take 1 capsule (2 mg total) by mouth 4 (four) times daily as needed for diarrhea or loose stools. 12 capsule 0   loratadine  (CLARITIN ) 10 MG tablet Take 1 tablet (10 mg total) by mouth daily as needed for allergies. 30 tablet 5   lubiprostone  (AMITIZA ) 8 MCG capsule Take 16 mcg by mouth daily with breakfast.      magnesium  oxide (MAG-OX) 400 (240 Mg) MG tablet Take 800 mg by mouth at bedtime.     meclizine  (ANTIVERT ) 25 MG tablet as needed. (Patient not taking: Reported on 04/28/2023)     montelukast  (SINGULAIR ) 10 MG tablet 1 TABLET BY MOUTH AT BEDTIME TO PREVENT COUGHING OR WHEEZING. 90 tablet 1   Olopatadine  HCl 0.6 % SOLN Place 2 sprays into both nostrils 2 (two) times daily. (Patient not taking: Reported on 04/28/2023) 30.5 g 5   ondansetron  (ZOFRAN ) 4 MG tablet Take 1 tablet (4 mg total) by mouth every 4 (four) hours as needed for nausea or vomiting. 12 tablet 0   ondansetron  (ZOFRAN -ODT) 4 MG disintegrating tablet Take 1 tablet (4 mg total) by mouth every 8 (eight) hours as needed for nausea or vomiting. 10 tablet 0   pantoprazole   (PROTONIX ) 20 MG tablet Take 1 tablet (20 mg total) by mouth daily. (Patient taking differently: Take 20 mg by mouth as needed.) 30 tablet 5   Polyethyl Glycol-Propyl Glycol (SYSTANE OP) Place 1 drop into both eyes 3 (three) times daily as needed (for dryness).     potassium chloride  (KLOR-CON ) 10 MEQ tablet Take 10 mEq by mouth daily.     pravastatin  (PRAVACHOL ) 20 MG tablet Take 20 mg by mouth daily after supper.     PROAIR  HFA 108 (90 Base) MCG/ACT inhaler Inhale 2 puffs into the lungs every 4 (four) hours as needed for  wheezing or shortness of breath. 6.7 g 0   VENTOLIN  HFA 108 (90 Base) MCG/ACT inhaler Inhale 2 puffs into the lungs every 4 (four) hours as needed for wheezing or shortness of breath. 8 g 1   No facility-administered medications prior to visit.    PAST MEDICAL HISTORY: Past Medical History:  Diagnosis Date   Asthma    Brain tumor (HCC)    Diabetes mellitus without complication (HCC)    Eczema    Food allergy     Gastroparesis    History of CVA (cerebrovascular accident) 09/03/2020   Hypokalemia    Hypomagnesemia    Hypothyroid    Intractable nausea and vomiting 07/12/2021   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   Mild cognitive impairment 09/04/2020   Neuropathy    Post concussion syndrome 05/2019   Vision changes     PAST SURGICAL HISTORY: Past Surgical History:  Procedure Laterality Date   BRAIN SURGERY     Portacath placement and removed     WISDOM TOOTH EXTRACTION      FAMILY HISTORY: Family History  Problem Relation Age of Onset   Thyroid disease Mother    Sleep apnea Father    Eczema Sister    Thyroid disease Sister    Thyroid disease Sister     SOCIAL HISTORY: Social History   Socioeconomic History   Marital status: Single    Spouse name: Not on file   Number of children: Not on file   Years of education: Not on file   Highest education level: Not on file  Occupational History   Not on file  Tobacco Use   Smoking status: Never    Smokeless tobacco: Never  Vaping Use   Vaping status: Never Used  Substance and Sexual Activity   Alcohol  use: No   Drug use: No   Sexual activity: Not Currently  Other Topics Concern   Not on file  Social History Narrative   Not on file   Social Drivers of Health   Tobacco Use: Low Risk  (01/08/2024)   Received from [facility]   Patient History    Smoking Tobacco Use: Never    Smokeless Tobacco Use: Never    Passive Exposure: Not on file  Financial Resource Strain: Low Risk  (11/01/2023)   Received from [facility]   Overall Financial Resource Strain (CARDIA)    Difficulty of Paying Living Expenses: Not hard at all  Food Insecurity: No Food Insecurity (11/01/2023)   Received from [facility]   Epic    Within the past 12 months, you worried that your food would run out before you got the money to buy more.: Never true    Within the past 12 months, the food you bought just didn't last and you didn't have money to get more.: Never true  Transportation Needs: No Transportation Needs (11/01/2023)   Received from [facility] - Transportation    In the past 12 months, has lack of transportation kept you from medical appointments or from getting medications?: No    Lack of Transportation (Non-Medical): No  Physical Activity: Not on file  Stress: Not on file  Social Connections: Not on file  Intimate Partner Violence: Not At Risk (12/18/2022)   Humiliation, Afraid, Rape, and Kick questionnaire    Fear of Current or Ex-Partner: No    Emotionally Abused: No    Physically Abused: No    Sexually Abused: No  Depression (  PHQ2-9): Low Risk (04/28/2023)   Depression (PHQ2-9)    PHQ-2 Score: 0  Alcohol  Screen: Not on file  Housing: Low Risk  (10/30/2023)   Received from [facility]   Epic    In the last 12 months, was there a time when you were not able to pay the mortgage or rent on  time?: No    In the past 12 months, how many times have you moved where you were living?: 0    At any time in the past 12 months, were you homeless or living in a shelter (including now)?: No  Utilities: Not At Risk (10/30/2023)   Received from [facility]   Epic    In the past 12 months has the electric, gas, oil, or water company threatened to shut off services in your home?: No  Health Literacy: Not on file      PHYSICAL EXAM Generalized: Well developed, in no acute distress   Neurological examination  Mentation: Alert oriented to time, place, history taking. Follows all commands speech and language fluent Cranial nerve II-XII: Facial symmetry noted.   DIAGNOSTIC DATA (LABS, IMAGING, TESTING) - I reviewed patient records, labs, notes, testing and imaging myself where available.  Lab Results  Component Value Date   WBC 12.6 (H) 10/04/2023   HGB 13.2 10/04/2023   HCT 38.8 10/04/2023   MCV 90.9 10/04/2023   PLT 318 10/04/2023      Component Value Date/Time   NA 129 (L) 10/04/2023 0159   K 4.5 10/04/2023 0159   CL 91 (L) 10/04/2023 0159   CO2 28 10/04/2023 0159   GLUCOSE 294 (H) 10/04/2023 0159   BUN 10 10/04/2023 0159   CREATININE 1.06 (H) 10/04/2023 0159   CALCIUM  9.0 10/04/2023 0159   PROT 7.2 10/04/2023 0159   ALBUMIN 3.9 10/04/2023 0159   AST 23 10/04/2023 0159   ALT 22 10/04/2023 0159   ALKPHOS 79 10/04/2023 0159   BILITOT 0.3 10/04/2023 0159   GFRNONAA >60 10/04/2023 0159   GFRAA >60 08/15/2019 1227   Lab Results  Component Value Date   CHOL 215 (H) 09/03/2020   HDL 40 (L) 09/03/2020   LDLCALC 135 (H) 09/03/2020   TRIG 198 (H) 09/03/2020   CHOLHDL 5.4 09/03/2020   Lab Results  Component Value Date   HGBA1C 8.8 (H) 07/13/2021   No results found for: [id number] Lab Results  Component Value Date   TSH 0.669 07/13/2021      ASSESSMENT AND PLAN 39 y.o. year old female  has a past medical history of Asthma, Brain tumor (HCC),  Diabetes mellitus without complication (HCC), Eczema, Food allergy , Gastroparesis, History of CVA (cerebrovascular accident) (09/03/2020), Hypokalemia, Hypomagnesemia, Hypothyroid, Intractable nausea and vomiting (07/12/2021), Medulloblastoma (HCC) (11/16/2015), Mild cognitive impairment (09/04/2020), Neuropathy, Post concussion syndrome (05/2019), and Vision changes. here with:  OSA on CPAP  CPAP compliance excellent Residual AHI is good Encouraged patient to continue using CPAP nightly and > 4 hours each night Encouraged her to change out her supplies regularly.  Make sure the mask is fitting securely if she feels it leaking we can do a mask refitting. F/U in 1 year or sooner if needed    Duwaine Russell, MSN, NP-C 01/30/2024, 12:41 PM [facility] [address], [address] [place], [place] [address] [phone]  The patient's condition requires frequent monitoring and adjustments in the treatment plan, reflecting the ongoing complexity of care.  This provider is the continuing focal point for all  needed services for this condition.      [1]  Allergies Allergen Reactions   Bactrim [Sulfamethoxazole-Trimethoprim] Hives   Doxycycline Hives   Gentian Violet Other (See Comments)    Mouth burns   Gentian Violet-Proflavine Sulfate [Triple Dye] Other (See Comments)    Mouth burns   Mold Extract [Trichophyton] Other (See Comments)    Hay Fever   Morphine And Codeine Hives   Other Hives, Itching and Other (See Comments)    Hay fever, dust and pollen.  Per patient.- itching, runny nose   Sulfa Antibiotics    Vancomycin  Other (See Comments)    Red man syndrome    "

## 2024-01-30 NOTE — Patient Instructions (Signed)
 Your Plan:  Continue using CPAP nightly and greater than 4 hours each night If your symptoms worsen or you develop new symptoms please let us  know.       Thank you for coming to see us  at [facility]. I hope we have been able to provide you high quality care today.  You may receive a patient satisfaction survey over the next few weeks. We would appreciate your feedback and comments so that we may continue to improve ourselves and the health of our patients.

## 2024-02-06 ENCOUNTER — Ambulatory Visit: Admitting: Physical Therapy

## 2024-02-06 NOTE — Therapy (Incomplete)
 " OUTPATIENT PHYSICAL THERAPY NEURO EVALUATION   Patient Name: Tracey Perkins MRN: [id number] DOB:1985-05-02, 39 y.o., female Today's Date: 02/06/2024   PCP: Janey Santos, MD  REFERRING PROVIDER: Camalier, Curry Scarce, NP   END OF SESSION:   Past Medical History:  Diagnosis Date   Asthma    Brain tumor (HCC)    Diabetes mellitus without complication (HCC)    Eczema    Food allergy     Gastroparesis    History of CVA (cerebrovascular accident) 09/03/2020   Hypokalemia    Hypomagnesemia    Hypothyroid    Intractable nausea and vomiting 07/12/2021   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   Mild cognitive impairment 09/04/2020   Neuropathy    Post concussion syndrome 05/2019   Vision changes    Past Surgical History:  Procedure Laterality Date   BRAIN SURGERY     Portacath placement and removed     WISDOM TOOTH EXTRACTION     Patient Active Problem List   Diagnosis Date Noted   MDD (major depressive disorder), recurrent, in partial remission 11/28/2023   Acute respiratory failure with hypoxia (HCC) 12/18/2022   Class 1 obesity 12/18/2022   Multifocal pneumonia 12/18/2022   Hyperlipidemia 12/18/2022   Colitis 12/22/2021   Tachycardia 07/12/2021   Intractable nausea and vomiting 07/12/2021   Diarrhea 07/12/2021   Diabetic neuropathy (HCC) 07/12/2021   Hypothyroidism 07/12/2021   CVA (cerebral vascular accident) (HCC) 09/16/2020   Mild cognitive impairment 09/04/2020   Diabetic gastroparesis (HCC) 09/04/2020   History of CVA (cerebrovascular accident) 09/03/2020   Diabetic polyneuropathy associated with type 1 diabetes mellitus (HCC) 05/15/2020   Post concussive syndrome 05/15/2020   Insulin  pump status 04/02/2020   Impulsiveness 10/14/2019   Mild episode of recurrent major depressive disorder 09/04/2019   Mood disorder 08/27/2019   Anxiety 08/16/2019   Polyneuropathy 06/19/2019   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   Status post radiation therapy 11/22/2018   Seasonal and perennial allergic rhinitis 11/22/2018   Seasonal allergic conjunctivitis 11/22/2018   Acute sinusitis 11/22/2018   Keratosis pilaris 10/05/2017   Melanocytic nevi of trunk 10/05/2017   Adverse food reaction 02/09/2017   Gastroesophageal reflux disease 02/09/2017   Mild intermittent asthma with acute exacerbation 11/19/2015   Mild persistent asthma without complication 11/16/2015   Acute seasonal allergic rhinitis due to pollen 11/16/2015   Allergy  with anaphylaxis due to food 11/16/2015   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   Type 1 diabetes (HCC) 11/16/2015   Dry mouth 11/16/2015   Xerosis cutis 11/16/2015   Nystagmus 08/15/2014   Visual field defect 08/15/2014   Disequilibrium 07/25/2014   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    ONSET DATE: 01/29/2024 (MD referral)  REFERRING DIAG: C71.6 (ICD-10-CM) - Malignant neoplasm of cerebellum   THERAPY DIAG:  No diagnosis found.  Rationale for Evaluation and Treatment: Rehabilitation  SUBJECTIVE:  SUBJECTIVE STATEMENT: *** Pt accompanied by: {accompnied:27141}  PERTINENT HISTORY: medical history significant of asthma, brain tumor as a child medulloblastoma, history of TBI as an adult, history of CVA, mild cognitive impairment, postconcussion syndrome, vision changes, type 1 diabetes, diabetic gastroparesis, intractable nausea and vomiting, diabetic peripheral neuropathy, eczema, seasonal allergies, food allergy ,hypothyroidism, OSA on CPAP, neuropathy pain   PAIN:  Are you having pain? {OPRCPAIN:27236}  PRECAUTIONS: {Therapy precautions:24002}  RED FLAGS: {PT Red Flags:29287}   WEIGHT BEARING RESTRICTIONS: {Yes  ***/No:24003}  FALLS: Has patient fallen in last 6 months? {fallsyesno:27318}  LIVING ENVIRONMENT: Lives with: {OPRC lives with:25569::lives with their family} Lives in: {Lives in:25570} Stairs: {opstairs:27293} Has following equipment at home: {Assistive devices:23999}  PLOF: {PLOF:24004}  PATIENT GOALS: ***  OBJECTIVE:  Note: Objective measures were completed at Evaluation unless otherwise noted.  DIAGNOSTIC FINDINGS: ***  COGNITION: Overall cognitive status: {cognition:24006}   SENSATION: {sensation:27233}  COORDINATION: ***  EDEMA:  {edema:24020}  MUSCLE TONE: {LE tone:25568}  MUSCLE LENGTH: Hamstrings: Right *** deg; Left *** deg Debby test: Right *** deg; Left *** deg  DTRs:  {DTR SITE:24025}  POSTURE: {posture:25561}  LOWER EXTREMITY ROM:     {AROM/PROM:27142}  Right Eval Left Eval  Hip flexion    Hip extension    Hip abduction    Hip adduction    Hip internal rotation    Hip external rotation    Knee flexion    Knee extension    Ankle dorsiflexion    Ankle plantarflexion    Ankle inversion    Ankle eversion     (Blank rows = not tested)  LOWER EXTREMITY MMT:    MMT Right Eval Left Eval  Hip flexion    Hip extension    Hip abduction    Hip adduction    Hip internal rotation    Hip external rotation    Knee flexion    Knee extension    Ankle dorsiflexion    Ankle plantarflexion    Ankle inversion    Ankle eversion    (Blank rows = not tested)  BED MOBILITY:  {bed mobility:32615:p}  TRANSFERS: {transfers eval:32620}  STAIRS: {stairs eval:32618} GAIT: Findings: {GaitneuroPT:32644::Distance walked: ***,Comments: ***}  FUNCTIONAL TESTS:  {Functional tests:24029}  PATIENT SURVEYS:  {rehab surveys:24030}                                                                                                                              TREATMENT DATE: 02/06/2024    PATIENT EDUCATION: Education details: Eval results,  POC *** Person educated: {Person educated:25204} Education method: {Education Method:25205} Education comprehension: {Education Comprehension:25206}  HOME EXERCISE PROGRAM: ***  GOALS: Goals reviewed with patient? Yes  SHORT TERM GOALS: Target date: ***  *** Baseline: Goal status: INITIAL  2.  *** Baseline:  Goal status: INITIAL  3.  *** Baseline:  Goal status: INITIAL  4.  *** Baseline:  Goal status: INITIAL  5.  *** Baseline:  Goal status: INITIAL  6.  *** Baseline:  Goal status: INITIAL  LONG TERM GOALS: Target date: ***  *** Baseline:  Goal status: INITIAL  2.  *** Baseline:  Goal status: INITIAL  3.  *** Baseline:  Goal status: INITIAL  4.  *** Baseline:  Goal status: INITIAL  5.  *** Baseline:  Goal status: INITIAL  6.  *** Baseline:  Goal status: INITIAL  ASSESSMENT:  CLINICAL IMPRESSION: Patient is a 39 y.o. female who was seen today for physical therapy evaluation and treatment for ***.   OBJECTIVE IMPAIRMENTS: {opptimpairments:25111}.   ACTIVITY LIMITATIONS: {activitylimitations:27494}  PARTICIPATION LIMITATIONS: {participationrestrictions:25113}  PERSONAL FACTORS: {Personal factors:25162} are also affecting patient's functional outcome.   REHAB POTENTIAL: {rehabpotential:25112}  CLINICAL DECISION MAKING: {clinical decision making:25114}  EVALUATION COMPLEXITY: {Evaluation complexity:25115}  PLAN:  PT FREQUENCY: {rehab frequency:25116}  PT DURATION: {rehab duration:25117}  PLANNED INTERVENTIONS: {rehab planned interventions:25118::97110-Therapeutic exercises,97530- Therapeutic [id number]- Neuromuscular re-education,97535- Self Rjmz,02859- Manual therapy,Patient/Family education}  PLAN FOR NEXT SESSION: Tracey Perkins., PT 02/06/2024, 7:44 AM   [facility] [address], [address] [place], [place] [address] Phone # [phone] Fax # [phone]      "
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
## 2024-02-15 ENCOUNTER — Telehealth (HOSPITAL_COMMUNITY): Admitting: Psychiatry

## 2024-02-15 DIAGNOSIS — F3341 Major depressive disorder, recurrent, in partial remission: Secondary | ICD-10-CM

## 2024-02-15 DIAGNOSIS — F419 Anxiety disorder, unspecified: Secondary | ICD-10-CM

## 2024-02-15 DIAGNOSIS — F411 Generalized anxiety disorder: Secondary | ICD-10-CM

## 2024-02-15 MED ORDER — CITALOPRAM HYDROBROMIDE 10 MG/5ML PO SOLN: 5.0000 mg | Freq: Every day | ORAL | 0 refills | Status: AC

## 2024-02-21 NOTE — Therapy (Unsigned)
 " OUTPATIENT PHYSICAL THERAPY NEURO EVALUATION   Patient Name: Tracey Perkins MRN: [id number] DOB:04-30-1985, 39 y.o., female Today's Date: 02/22/2024   PCP: Janey Santos, CHRISTELLA  REFERRING PROVIDER: Camalier, Curry Scarce, NP  END OF SESSION:  PT End of Session - 02/22/24 1638     Visit Number 1    Number of Visits 7    Date for Recertification  04/04/24    Authorization Type [place] [employer]    Authorization Time Period auth submitted    Authorization - Number of Visits --   27 visits combined   PT Start Time 1450    PT Stop Time 1530    PT Time Calculation (min) 40 min    Equipment Utilized During Treatment Gait belt    Activity Tolerance Patient tolerated treatment well;Other (comment)   dizziness, chronic nystagmus   Behavior During Therapy WFL for tasks assessed/performed          Past Medical History:  Diagnosis Date   Asthma    Brain tumor (HCC)    Diabetes mellitus without complication (HCC)    Eczema    Food allergy     Gastroparesis    History of CVA (cerebrovascular accident) 09/03/2020   Hypokalemia    Hypomagnesemia    Hypothyroid    Intractable nausea and vomiting 07/12/2021   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   Mild cognitive impairment 09/04/2020   Neuropathy    Post concussion syndrome 05/2019   Vision changes    Past Surgical History:  Procedure Laterality Date   BRAIN SURGERY     Portacath placement and removed     WISDOM TOOTH EXTRACTION     Patient Active Problem List   Diagnosis Date Noted   MDD (major depressive disorder), recurrent, in partial remission 11/28/2023   Acute respiratory failure with hypoxia (HCC) 12/18/2022   Class 1 obesity 12/18/2022   Multifocal pneumonia 12/18/2022   Hyperlipidemia 12/18/2022   Colitis 12/22/2021   Tachycardia 07/12/2021   Intractable nausea and vomiting 07/12/2021   Diarrhea 07/12/2021   Diabetic neuropathy (HCC) 07/12/2021   Hypothyroidism 07/12/2021   CVA (cerebral vascular  accident) (HCC) 09/16/2020   Mild cognitive impairment 09/04/2020   Diabetic gastroparesis (HCC) 09/04/2020   History of CVA (cerebrovascular accident) 09/03/2020   Diabetic polyneuropathy associated with type 1 diabetes mellitus (HCC) 05/15/2020   Post concussive syndrome 05/15/2020   Insulin  pump status 04/02/2020   Impulsiveness 10/14/2019   Mild episode of recurrent major depressive disorder 09/04/2019   Mood disorder 08/27/2019   Anxiety 08/16/2019   Polyneuropathy 06/19/2019   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   Status post radiation therapy 11/22/2018   Seasonal and perennial allergic rhinitis 11/22/2018   Seasonal allergic conjunctivitis 11/22/2018   Acute sinusitis 11/22/2018   Keratosis pilaris 10/05/2017   Melanocytic nevi of trunk 10/05/2017   Adverse food reaction 02/09/2017   Gastroesophageal reflux disease 02/09/2017   Mild intermittent asthma with acute exacerbation 11/19/2015   Mild persistent asthma without complication 11/16/2015   Acute seasonal allergic rhinitis due to pollen 11/16/2015   Allergy  with anaphylaxis due to food 11/16/2015   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   Type 1 diabetes (HCC) 11/16/2015   Dry mouth 11/16/2015   Xerosis cutis 11/16/2015   Nystagmus 08/15/2014   Visual field defect 08/15/2014   Disequilibrium 07/25/2014   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    ONSET DATE: father reports it's progressing  REFERRING DIAG:  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 Patient reports using walker more than before and father reports balance and walking is an issue.Pt reports nystagmus from her stroke and brain tumor as well as diplopia and difficulty with depth perception. Got new glasses with prisms which makes vision better. Reports turning head, turning body, caffeine increases her nystagmus. Reports a partial rail on her bed d/t a fall out of bed. Also reports fall with head trauma in October 2025 and got a slight concussion. Issues with judging curbs/sidewalks. Reports assist from family when walking on uneven surfaces.    Pt accompanied by: father- assists with subjective   PERTINENT HISTORY: Brain tumor s/p surgery, asthma, DM, CVA 2022, MCI, post-concussion syndrome, neuropathy: per pt- B hearing loss with R ear better  PAIN:  Are you having pain? No  PRECAUTIONS: Fall  RED FLAGS: None   WEIGHT BEARING RESTRICTIONS: No  FALLS: Has patient fallen in last 6 months? Yes. Number of falls 4-6 months  LIVING ENVIRONMENT: Lives with: lives with their family Lives in: House/apartment Stairs: 7-8 with handrail Has following equipment at home: Vannie - 2 wheeled, Environmental Consultant - 4 wheeled, Wheelchair (manual), shower chair, and Grab bars  PLOF: Independent with basic ADLs, Needs assistance with gait, and pt reports family assist her with gait    PATIENT GOALS: work on balance   OBJECTIVE:  Note: Objective measures were completed at Evaluation unless otherwise noted.  DIAGNOSTIC FINDINGS: none recent  COGNITION: Overall cognitive status: Within functional limits for tasks assessed   SENSATION: Pt reports neuropathy in B feet  COORDINATION: Alternating pronation/supination: L dysmetria Alternating toe tap: unable to coordinate quickly Finger to nose: dysmetric L  MUSCLE TONE: WNL  B   POSTURE: rounded shoulders  LOWER EXTREMITY ROM:     Active  Right Eval Left Eval  Hip flexion    Hip extension    Hip abduction    Hip adduction    Hip internal rotation    Hip external rotation    Knee flexion    Knee extension    Ankle dorsiflexion 10 -2  Ankle plantarflexion    Ankle inversion    Ankle eversion     (Blank rows = not tested)  LOWER EXTREMITY MMT:    MMT (in sitting) Right Eval Left Eval  Hip flexion 4 4  Hip extension    Hip abduction 4- 4  Hip adduction 3+ 3+  Hip internal rotation    Hip external rotation    Knee flexion 4+ 4+  Knee extension 4+ 4+  Ankle dorsiflexion 4+ 4+  Ankle plantarflexion 4 4  Ankle inversion    Ankle eversion    (Blank rows = not tested)   Observation: spontaneous L beating nystagmus and  gaze evoked direction changing nystagmus with superior, L, and R  gaze    GAIT: Findings: Assistive device utilized:None, Level of assistance: CGA and Min A, and Comments: wide BOS with ataxia and slight foot slap/reduced control    [facility] PT Assessment - 02/22/24 0001       Standardized Balance Assessment   Standardized Balance Assessment 10 meter walk test    10 Meter Walk 12.69 sec (2.58 ft/sec)      Functional Gait  Assessment   Gait assessed  Yes    Gait Level Surface Walks 20 ft, slow speed, abnormal gait pattern, evidence for imbalance or deviates 10-15 in outside of the 12 in walkway width. Requires more than 7 sec to ambulate 20 ft.    Change in Gait Speed Makes only minor adjustments to walking speed, or accomplishes a change in speed with significant gait deviations, deviates 10-15 in outside the 12 in walkway width, or changes speed but loses balance but is able to recover and continue walking.    Gait with Horizontal Head Turns Performs head turns with moderate changes in gait velocity, slows down, deviates 10-15 in outside 12 in walkway width but recovers, can continue to walk.    Gait with Vertical Head  Turns Performs task with severe disruption of gait (eg, staggers 15 in outside 12 in walkway width, loses balance, stops, reaches for wall).    Gait and Pivot Turn Turns slowly, requires verbal cueing, or requires several small steps to catch balance following turn and stop    FGA comment: pt reports increased nystagmus with tasks, requiring rest breaks                                                                                                                                       TREATMENT DATE: 02/22/24    PATIENT EDUCATION: Education details: prognosis, POC, edu on goals of therapy and how PT can assist with functional impairments Person educated: Patient and Parent Education method: Explanation Education comprehension: verbalized understanding  HOME EXERCISE PROGRAM: Not yet initiated    GOALS: Goals reviewed with patient? Yes  SHORT TERM GOALS: Target date: 03/14/2024  Patient to be independent with initial HEP. Baseline: HEP initiated Goal status: INITIAL    LONG  TERM GOALS: Target date: 04/04/2024  Patient to be independent with advanced HEP. Baseline: Not yet initiated  Goal status: INITIAL  Patient to demonstrate B LE hip >/=4+/5.  Baseline: See above Goal status: INITIAL  Patient to ambulate 375ft with LRAD with SBA and no LOB. Baseline: HHA from father Goal status: INITIAL  Patient to report understanding of fall prevention info. Baseline: - Goal status: INITIAL  Patient to report no falls for the past 4 weeks.  Baseline: reports 4-6 falls/month Goal status: INITIAL  FGA goal to be created. Baseline: - Goal status: INITIAL    ASSESSMENT:   Patient is a 39 y/o F presenting to OPPT with father with c/o worsening balance and gait deviations progressing since last POC which ended in August 2025. Of note, patient reports multiple falls in the past 6 months, one with head trauma which patient reports resulted in a slight concussion. Patient with  acquired nystagmus, diplopia, and c/o reduced depth perception from hx CVA and brain tumor. Patient today presenting with Reduced UE and LE coordination, reduced L dorsiflexion AROM, B hip weakness, spontaneous nystagmus, gaze evoked direction changing nystagmus, gait deviations, and imbalance, reduced activity tolerance d/t dizziness/nystagmus. . Would benefit from skilled PT services 1 x/week for 6 weeks to address aforementioned impairments in order to optimize level of function.    OBJECTIVE IMPAIRMENTS: Abnormal gait, decreased activity tolerance, decreased balance, decreased coordination, difficulty walking, decreased ROM, decreased strength, dizziness, impaired flexibility, and postural dysfunction.   ACTIVITY LIMITATIONS: carrying, lifting, bending, standing, squatting, stairs, transfers, bed mobility, bathing, toileting, dressing, reach over head, hygiene/grooming, and locomotion level  PARTICIPATION LIMITATIONS: meal prep, cleaning, laundry, shopping, community activity, and church  PERSONAL FACTORS: Age, Past/current experiences, Time since onset of injury/illness/exacerbation, and 3+ comorbidities: Brain tumor s/p surgery, asthma, DM, CVA 2022, MCI, post-concussion syndrome, neuropathy are also affecting patient's functional outcome.   REHAB POTENTIAL: Good  CLINICAL DECISION MAKING: Unstable/unpredictable  EVALUATION COMPLEXITY: High  PLAN:  PT FREQUENCY: 1x/week  PT DURATION: 6 weeks  PLANNED INTERVENTIONS: 97164- PT Re-evaluation, 97110-Therapeutic exercises, 97530- Therapeutic activity, 97112- Neuromuscular re-education, 97535- Self Care, [id number]- Manual therapy, [phone]- Gait training, [phone]- Canalith repositioning, Patient/Family education, Balance training, Stair training, Taping, Vestibular training, DME instructions, Cryotherapy, and Moist heat  PLAN FOR NEXT SESSION: complete FGA and create goal; review fall prevention edu and strategies to improve safety on thresholds d/t  diplopia, initiate HEP or review past HEP for hip strengthening, balance, habituation    Referring diagnosis:  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 []  Surgery [x]  Fall []  Ongoing issue []  Arthritis [x]  Other: _progressive___________  Laterality: []  Rt []  Lt [x]  Both  Functional Tool & Score: test : 2.58 ft/sec   Check all possible CPT codes:     See Planned Interventions listed in the Plan section of the Evaluation.     If [employer]: Choose 10 or less codes  If [employer] Managed Medicaid: Modalities are not covered  If [employer]: Check allowed ICD code combinations   If [facility] Plan or [employer]: Cognitive training not covered    Louana Terrilyn Christians, PT, DPT 02/22/24 4:53 PM  [facility]  at [facility] [address] [place], [place] [address] Phone # [phone] Fax # [phone]        "

## 2024-02-22 ENCOUNTER — Other Ambulatory Visit: Payer: Self-pay

## 2024-02-22 ENCOUNTER — Ambulatory Visit: Admitting: Physical Therapy

## 2024-02-22 ENCOUNTER — Encounter: Payer: Self-pay | Admitting: Physical Therapy

## 2024-02-22 DIAGNOSIS — R279 Unspecified lack of coordination: Secondary | ICD-10-CM

## 2024-02-22 DIAGNOSIS — R2681 Unsteadiness on feet: Secondary | ICD-10-CM

## 2024-02-22 DIAGNOSIS — R296 Repeated falls: Secondary | ICD-10-CM

## 2024-02-22 DIAGNOSIS — R42 Dizziness and giddiness: Secondary | ICD-10-CM

## 2024-02-22 DIAGNOSIS — R2689 Other abnormalities of gait and mobility: Secondary | ICD-10-CM

## 2024-02-23 ENCOUNTER — Encounter: Payer: Self-pay | Admitting: Adult Health

## 2024-02-27 ENCOUNTER — Ambulatory Visit

## 2024-02-29 ENCOUNTER — Ambulatory Visit

## 2024-03-07 ENCOUNTER — Ambulatory Visit

## 2024-03-14 ENCOUNTER — Ambulatory Visit

## 2024-03-21 ENCOUNTER — Ambulatory Visit

## 2024-03-28 ENCOUNTER — Ambulatory Visit: Admitting: Physical Therapy

## 2024-04-04 ENCOUNTER — Ambulatory Visit: Admitting: Physical Therapy

## 2024-04-25 ENCOUNTER — Telehealth (HOSPITAL_COMMUNITY): Admitting: Psychiatry

## 2025-01-30 ENCOUNTER — Telehealth: Admitting: Adult Health
